# Patient Record
Sex: Female | Born: 1935 | Race: Black or African American | Hispanic: No | Smoking: Former smoker
Health system: Southern US, Community
[De-identification: ages and names within clinical notes are randomized; demographics above are authoritative.]

## PROBLEM LIST (undated history)

## (undated) DIAGNOSIS — I739 Peripheral vascular disease, unspecified: Secondary | ICD-10-CM

## (undated) DIAGNOSIS — E785 Hyperlipidemia, unspecified: Secondary | ICD-10-CM

## (undated) DIAGNOSIS — E039 Hypothyroidism, unspecified: Secondary | ICD-10-CM

## (undated) DIAGNOSIS — R630 Anorexia: Secondary | ICD-10-CM

## (undated) DIAGNOSIS — E079 Disorder of thyroid, unspecified: Secondary | ICD-10-CM

## (undated) DIAGNOSIS — K635 Polyp of colon: Secondary | ICD-10-CM

## (undated) DIAGNOSIS — R011 Cardiac murmur, unspecified: Secondary | ICD-10-CM

## (undated) DIAGNOSIS — J189 Pneumonia, unspecified organism: Secondary | ICD-10-CM

## (undated) DIAGNOSIS — D649 Anemia, unspecified: Secondary | ICD-10-CM

## (undated) DIAGNOSIS — I10 Essential (primary) hypertension: Secondary | ICD-10-CM

## (undated) DIAGNOSIS — M1990 Unspecified osteoarthritis, unspecified site: Secondary | ICD-10-CM

## (undated) HISTORY — DX: Polyp of colon: K63.5

## (undated) HISTORY — DX: Disorder of thyroid, unspecified: E07.9

## (undated) HISTORY — DX: Hyperlipidemia, unspecified: E78.5

## (undated) HISTORY — PX: ABDOMINAL HYSTERECTOMY: SHX81

## (undated) HISTORY — PX: APPENDECTOMY: SHX54

## (undated) HISTORY — PX: BREAST EXCISIONAL BIOPSY: SUR124

## (undated) HISTORY — DX: Anorexia: R63.0

---

## 1998-05-16 ENCOUNTER — Emergency Department (HOSPITAL_COMMUNITY): Admission: EM | Admit: 1998-05-16 | Discharge: 1998-05-16 | Payer: Self-pay | Admitting: Emergency Medicine

## 1998-07-14 ENCOUNTER — Ambulatory Visit (HOSPITAL_COMMUNITY): Admission: RE | Admit: 1998-07-14 | Discharge: 1998-07-14 | Payer: Self-pay | Admitting: Orthopedic Surgery

## 1998-10-29 ENCOUNTER — Encounter: Admission: RE | Admit: 1998-10-29 | Discharge: 1999-01-27 | Payer: Self-pay | Admitting: Anesthesiology

## 2000-01-27 ENCOUNTER — Encounter: Payer: Self-pay | Admitting: Obstetrics and Gynecology

## 2000-01-27 ENCOUNTER — Encounter: Admission: RE | Admit: 2000-01-27 | Discharge: 2000-01-27 | Payer: Self-pay | Admitting: Obstetrics and Gynecology

## 2000-01-28 ENCOUNTER — Encounter: Payer: Self-pay | Admitting: Obstetrics and Gynecology

## 2000-01-28 ENCOUNTER — Encounter: Admission: RE | Admit: 2000-01-28 | Discharge: 2000-01-28 | Payer: Self-pay | Admitting: Obstetrics and Gynecology

## 2000-11-13 ENCOUNTER — Emergency Department (HOSPITAL_COMMUNITY): Admission: EM | Admit: 2000-11-13 | Discharge: 2000-11-13 | Payer: Self-pay | Admitting: Emergency Medicine

## 2001-03-15 ENCOUNTER — Encounter: Admission: RE | Admit: 2001-03-15 | Discharge: 2001-03-15 | Payer: Self-pay | Admitting: Obstetrics and Gynecology

## 2001-03-15 ENCOUNTER — Encounter: Payer: Self-pay | Admitting: Obstetrics and Gynecology

## 2002-02-28 ENCOUNTER — Other Ambulatory Visit: Admission: RE | Admit: 2002-02-28 | Discharge: 2002-02-28 | Payer: Self-pay | Admitting: Gynecology

## 2002-03-22 ENCOUNTER — Encounter: Admission: RE | Admit: 2002-03-22 | Discharge: 2002-03-22 | Payer: Self-pay | Admitting: Gynecology

## 2002-03-22 ENCOUNTER — Encounter: Payer: Self-pay | Admitting: Gynecology

## 2002-05-22 ENCOUNTER — Encounter (INDEPENDENT_AMBULATORY_CARE_PROVIDER_SITE_OTHER): Payer: Self-pay | Admitting: *Deleted

## 2002-05-22 ENCOUNTER — Ambulatory Visit (HOSPITAL_COMMUNITY): Admission: RE | Admit: 2002-05-22 | Discharge: 2002-05-22 | Payer: Self-pay | Admitting: *Deleted

## 2003-05-22 ENCOUNTER — Encounter: Payer: Self-pay | Admitting: Emergency Medicine

## 2003-05-22 ENCOUNTER — Emergency Department (HOSPITAL_COMMUNITY): Admission: EM | Admit: 2003-05-22 | Discharge: 2003-05-22 | Payer: Self-pay | Admitting: Emergency Medicine

## 2003-10-14 ENCOUNTER — Encounter: Admission: RE | Admit: 2003-10-14 | Discharge: 2003-10-14 | Payer: Self-pay | Admitting: Endocrinology

## 2004-06-30 ENCOUNTER — Other Ambulatory Visit: Admission: RE | Admit: 2004-06-30 | Discharge: 2004-06-30 | Payer: Self-pay | Admitting: Gynecology

## 2004-12-16 ENCOUNTER — Ambulatory Visit (HOSPITAL_COMMUNITY): Admission: RE | Admit: 2004-12-16 | Discharge: 2004-12-16 | Payer: Self-pay | Admitting: *Deleted

## 2005-01-15 ENCOUNTER — Emergency Department (HOSPITAL_COMMUNITY): Admission: EM | Admit: 2005-01-15 | Discharge: 2005-01-15 | Payer: Self-pay | Admitting: Emergency Medicine

## 2005-06-02 ENCOUNTER — Encounter: Admission: RE | Admit: 2005-06-02 | Discharge: 2005-06-02 | Payer: Self-pay | Admitting: Endocrinology

## 2005-07-22 ENCOUNTER — Encounter: Admission: RE | Admit: 2005-07-22 | Discharge: 2005-07-22 | Payer: Self-pay

## 2005-08-10 ENCOUNTER — Encounter: Admission: RE | Admit: 2005-08-10 | Discharge: 2005-08-10 | Payer: Self-pay

## 2005-12-20 ENCOUNTER — Encounter: Admission: RE | Admit: 2005-12-20 | Discharge: 2005-12-20 | Payer: Self-pay | Admitting: Orthopedic Surgery

## 2006-07-12 ENCOUNTER — Encounter: Admission: RE | Admit: 2006-07-12 | Discharge: 2006-07-12 | Payer: Self-pay | Admitting: Endocrinology

## 2006-07-15 HISTORY — PX: CARDIOVASCULAR STRESS TEST: SHX262

## 2006-07-26 ENCOUNTER — Other Ambulatory Visit: Admission: RE | Admit: 2006-07-26 | Discharge: 2006-07-26 | Payer: Self-pay | Admitting: Gynecology

## 2007-04-30 ENCOUNTER — Emergency Department (HOSPITAL_COMMUNITY): Admission: EM | Admit: 2007-04-30 | Discharge: 2007-04-30 | Payer: Self-pay | Admitting: Emergency Medicine

## 2007-08-02 ENCOUNTER — Encounter: Admission: RE | Admit: 2007-08-02 | Discharge: 2007-08-02 | Payer: Self-pay | Admitting: Endocrinology

## 2007-11-22 ENCOUNTER — Encounter: Admission: RE | Admit: 2007-11-22 | Discharge: 2007-11-22 | Payer: Self-pay | Admitting: Endocrinology

## 2008-06-27 ENCOUNTER — Emergency Department (HOSPITAL_COMMUNITY): Admission: EM | Admit: 2008-06-27 | Discharge: 2008-06-27 | Payer: Self-pay | Admitting: Emergency Medicine

## 2008-07-23 ENCOUNTER — Observation Stay (HOSPITAL_COMMUNITY): Admission: EM | Admit: 2008-07-23 | Discharge: 2008-07-24 | Payer: Self-pay | Admitting: *Deleted

## 2008-08-21 ENCOUNTER — Encounter: Admission: RE | Admit: 2008-08-21 | Discharge: 2008-08-21 | Payer: Self-pay | Admitting: Endocrinology

## 2009-09-16 ENCOUNTER — Encounter: Admission: RE | Admit: 2009-09-16 | Discharge: 2009-09-16 | Payer: Self-pay | Admitting: Endocrinology

## 2010-09-24 ENCOUNTER — Encounter
Admission: RE | Admit: 2010-09-24 | Discharge: 2010-09-24 | Payer: Self-pay | Source: Home / Self Care | Attending: Endocrinology | Admitting: Endocrinology

## 2011-01-06 ENCOUNTER — Emergency Department (HOSPITAL_COMMUNITY): Payer: Medicare Other

## 2011-01-06 ENCOUNTER — Inpatient Hospital Stay (HOSPITAL_COMMUNITY)
Admission: EM | Admit: 2011-01-06 | Discharge: 2011-01-09 | DRG: 310 | Disposition: A | Discharge status: Home / Self Care | Payer: Medicare Other | Attending: Internal Medicine | Admitting: Internal Medicine

## 2011-01-06 DIAGNOSIS — I4891 Unspecified atrial fibrillation: Principal | ICD-10-CM | POA: Diagnosis present

## 2011-01-06 DIAGNOSIS — E876 Hypokalemia: Secondary | ICD-10-CM | POA: Diagnosis present

## 2011-01-06 DIAGNOSIS — I10 Essential (primary) hypertension: Secondary | ICD-10-CM | POA: Diagnosis present

## 2011-01-06 DIAGNOSIS — E785 Hyperlipidemia, unspecified: Secondary | ICD-10-CM | POA: Diagnosis present

## 2011-01-06 DIAGNOSIS — E039 Hypothyroidism, unspecified: Secondary | ICD-10-CM | POA: Diagnosis present

## 2011-01-06 DIAGNOSIS — E0590 Thyrotoxicosis, unspecified without thyrotoxic crisis or storm: Secondary | ICD-10-CM | POA: Diagnosis not present

## 2011-01-06 DIAGNOSIS — E1169 Type 2 diabetes mellitus with other specified complication: Secondary | ICD-10-CM | POA: Diagnosis present

## 2011-01-06 DIAGNOSIS — Z7901 Long term (current) use of anticoagulants: Secondary | ICD-10-CM

## 2011-01-06 LAB — COMPREHENSIVE METABOLIC PANEL
ALT: 51 U/L — ABNORMAL HIGH (ref 0–35)
AST: 34 U/L (ref 0–37)
CO2: 24 mEq/L (ref 19–32)
Calcium: 9.6 mg/dL (ref 8.4–10.5)
Chloride: 106 mEq/L (ref 96–112)
Creatinine, Ser: 0.83 mg/dL (ref 0.4–1.2)
GFR calc Af Amer: >60 mL/min (ref >60)
GFR calc non Af Amer: >60 mL/min (ref >60)
Glucose, Bld: 79 mg/dL (ref 70–99)
Sodium: 140 mEq/L (ref 135–145)
Total Bilirubin: 0.6 mg/dL (ref 0.3–1.2)

## 2011-01-06 LAB — CBC
Hemoglobin: 12.5 g/dL (ref 12.0–15.0)
MCH: 27.6 pg (ref 26.0–34.0)
MCHC: 33.5 g/dL (ref 30.0–36.0)
RDW: 14.2 % (ref 11.5–15.5)

## 2011-01-06 LAB — POCT I-STAT, CHEM 8
BUN: 23 mg/dL (ref 6–23)
Calcium, Ion: 1.08 mmol/L — ABNORMAL LOW (ref 1.12–1.32)
Creatinine, Ser: 0.9 mg/dL (ref 0.4–1.2)
Glucose, Bld: 77 mg/dL (ref 70–99)
Hemoglobin: 13.3 g/dL (ref 12.0–15.0)
TCO2: 24 mmol/L (ref 0–100)

## 2011-01-06 LAB — PROTIME-INR
INR: 0.99 (ref 0.00–1.49)
Prothrombin Time: 13.3 seconds (ref 11.6–15.2)

## 2011-01-06 LAB — POCT CARDIAC MARKERS: Myoglobin, poc: 56.1 ng/mL (ref 12–200)

## 2011-01-06 LAB — DIFFERENTIAL
Basophils Absolute: 0.1 10*3/uL (ref 0.0–0.1)
Basophils Relative: 1 % (ref 0–1)
Eosinophils Relative: 2 % (ref 0–5)
Monocytes Absolute: 1 10*3/uL (ref 0.1–1.0)
Monocytes Relative: 11 % (ref 3–12)

## 2011-01-07 LAB — BASIC METABOLIC PANEL
CO2: 24 mEq/L (ref 19–32)
Chloride: 104 mEq/L (ref 96–112)
GFR calc non Af Amer: >60 mL/min (ref >60)
Glucose, Bld: 87 mg/dL (ref 70–99)
Potassium: 3 mEq/L — ABNORMAL LOW (ref 3.5–5.1)
Sodium: 141 mEq/L (ref 135–145)

## 2011-01-07 LAB — GLUCOSE, CAPILLARY: Glucose-Capillary: 76 mg/dL (ref 70–99)

## 2011-01-07 LAB — CBC
HCT: 33 % — ABNORMAL LOW (ref 36.0–46.0)
Hemoglobin: 10.8 g/dL — ABNORMAL LOW (ref 12.0–15.0)
MCV: 82.7 fL (ref 78.0–100.0)
Platelets: 277 10*3/uL (ref 150–400)
RBC: 3.99 MIL/uL (ref 3.87–5.11)
WBC: 7.6 10*3/uL (ref 4.0–10.5)

## 2011-01-08 HISTORY — PX: TRANSTHORACIC ECHOCARDIOGRAM: SHX275

## 2011-01-08 LAB — GLUCOSE, CAPILLARY
Glucose-Capillary: 130 mg/dL — ABNORMAL HIGH (ref 70–99)
Glucose-Capillary: 63 mg/dL — ABNORMAL LOW (ref 70–99)

## 2011-01-08 LAB — CBC
HCT: 32.6 % — ABNORMAL LOW (ref 36.0–46.0)
MCHC: 32.5 g/dL (ref 30.0–36.0)
MCV: 82.5 fL (ref 78.0–100.0)
RDW: 14.3 % (ref 11.5–15.5)

## 2011-01-08 LAB — BASIC METABOLIC PANEL
CO2: 25 mEq/L (ref 19–32)
Calcium: 9.1 mg/dL (ref 8.4–10.5)
Creatinine, Ser: 0.77 mg/dL (ref 0.4–1.2)
GFR calc Af Amer: >60 mL/min (ref >60)
Glucose, Bld: 73 mg/dL (ref 70–99)

## 2011-01-09 LAB — CBC
MCH: 26.8 pg (ref 26.0–34.0)
MCHC: 32.6 g/dL (ref 30.0–36.0)
Platelets: 260 10*3/uL (ref 150–400)

## 2011-01-09 LAB — BASIC METABOLIC PANEL
Calcium: 9.2 mg/dL (ref 8.4–10.5)
GFR calc non Af Amer: >60 mL/min (ref >60)
Glucose, Bld: 67 mg/dL — ABNORMAL LOW (ref 70–99)
Sodium: 141 mEq/L (ref 135–145)

## 2011-01-09 LAB — PROTIME-INR
INR: 1 (ref 0.00–1.49)
Prothrombin Time: 13.4 seconds (ref 11.6–15.2)

## 2011-01-09 LAB — GLUCOSE, CAPILLARY: Glucose-Capillary: 100 mg/dL — ABNORMAL HIGH (ref 70–99)

## 2011-01-09 LAB — HEPARIN LEVEL (UNFRACTIONATED): Heparin Unfractionated: 1.05 IU/mL — ABNORMAL HIGH (ref 0.30–0.70)

## 2011-01-09 LAB — LIPID PANEL: Triglycerides: 101 mg/dL (ref <150)

## 2011-01-13 NOTE — Discharge Summary (Signed)
  NAMEEMELINE, SIMPSON NO.:  [id number]  MEDICAL RECORD NO.:  [id number]           PATIENT TYPE:  I  LOCATION:  2005                         FACILITY:  [facility]  PHYSICIAN:  [place] Molly Maselli, MD         DATE OF BIRTH:  05-01-1935  DATE OF ADMISSION:  01/06/2011 DATE OF DISCHARGE:  01/09/2011                              DISCHARGE SUMMARY   DISCHARGE DIAGNOSES: 1. Paroxysmal atrial fibrillation, the patient is in sinus rhythm at     discharge. 2. Treated hypertension. 3. Iatrogenic hyperthyroid, Synthroid held. 4. Type 2 non-insulin-dependent diabetes.  HOSPITAL COURSE:  The patient is a 76 year old female followed by Dr. Juleen China who presented with rapid atrial fibrillation to his office.  She was sent to [facility] ER and was seen by Dr. Rennis Golden.  Please see history and physical for complete details.  She was put on IV diltiazem and IV Cardizem and labs were obtained.  Her labs show a TSH of 0.01 with a T4 of 13.6 and a free T4 of 2.04.  Her Synthroid was held.  She was also noted to be hypokalemic with a potassium of 3.2 and this was repleted. Troponins were negative.  She continued to have faster rate and we added beta-blocker.  Coumadin was started.  She was changed over to Lovenox. By the 31st, she is converted to sinus rhythm and Dr. Tresa Endo feels she can go home with 3 doses of Lovenox and Coumadin.  She will have an INR in our office next week.  She is going to [place] next Friday and we will try and get her seen by cardiologist before she goes to [place]. She is improved symptomatically.  She will need followup of her TSH and this could be done by Dr. Juleen China.  LABORATORY DATA:  TSH is as noted above.  Troponins are negative x1. Liver functions show an AST of 34 and ALT 51.  Cholesterol is 88, HDL 36, and LDL 32.  Sodium 141, potassium 3.6, BUN 12, and creatinine 0.7. INR is 1.05 at discharge.  White count 7.3, hemoglobin 11, hematocrit 33.7, and platelets 260.  Chest  x-ray shows left basilar scarring, mild cardiomegaly, and low lung volumes.  Telemetry at discharge shows sinus rhythm.  DISPOSITION:  The patient is discharged in stable condition.  She will have a INR next Tuesday in the office.  She will see Dr. Rennis Golden later this week before she goes to [place].  She will need to follow up with Dr. Juleen China regarding her Synthroid dose and followup of her TSH.     Abelino Derrick, P.A.   ______________________________ [place] Izaias Krupka, MD    LKK/MEDQ  D:  01/09/2011  T:  01/10/2011  Job:  [id number]  cc:   Brooke Bonito, M.D.  Electronically Signed by Corine Shelter P.A. on 01/11/2011 11:39:42 AM Electronically Signed by Kirtland Bouchard. Na Waldrip M.D. on 01/13/2011 08:56:21 AM

## 2011-02-23 NOTE — H&P (Signed)
[name], [name] NO.:  [id number]   MEDICAL RECORD NO.:  [id number]          PATIENT TYPE:  INP   LOCATION:  1440                         FACILITY:  [facility]   PHYSICIAN:  [name], M.D. DATE OF BIRTH:  1935/08/10   DATE OF ADMISSION:  07/22/2008  DATE OF DISCHARGE:                              HISTORY & PHYSICAL   PRIMARY CARE PHYSICIAN:  Dr. Darci Needle.   CHIEF COMPLAINT:  Chest pain.   HISTORY OF PRESENT ILLNESS:  This is a 76 year old African American lady  with a history of hiatal hernia, hypothyroidism, hypertension, and  hyperlipidemia who was in her baseline state of health until she went  out to a [place] restaurant about 4:30 yesterday afternoon.  About a  half an hour after leaving the restaurant, she started having chest pain  which she felt was related to her stomach, and she started using  maneuvers that she usually takes when she has acid-reflux problems.  She  tried using [employer].  She tried using Pepcid but all to no avail.  The  pain seemed to get worse.  It  radiated to the left side of her chest  and down her left arm.  It was associated with nausea but no vomiting.  There was no diaphoresis or shortness of breath.  She denies any history  of dyspnea on exertion.  She denies any history of prior cardiac type  chest pains or lower extremity edema.   She reports that she had cardiac stress test done a year ago with the  doctor that works with Dr. Juleen China, presumably Dr. Aleen Campi, and stress  test was negative.  Because the pain was unrelieved and getting worse,  she came to the emergency room, and she was evaluated with chest pain,  and the hospitalist service called to assist with management.   PAST MEDICAL HISTORY:  1. Hypertension.  2. Hyperlipidemia.  3. Hypothyroidism.  4. GERD as noted above.   MEDICATIONS:  1. Crestor 20 mg daily.  2. Potassium chloride 1 tablet daily.  3. Hydrochlorothiazide 50 mg daily.  4.  Pepcid p.r.n.  5. Synthroid 50 mcg daily.  6. Aspirin 81 mg daily.  7. Ibuprofen and/or Celebrex p.r.n., the last use about 3 weeks ago.   ALLERGIES:  No known drug allergies.   SOCIAL HISTORY:  Denies tobacco, alcohol or illicit drug use.  She  actually discontinued smoking half a pack per day about 8 months ago.  She is a retired Conservation officer, nature.   FAMILY HISTORY:  Is significant for hypertension, diabetes and heart  disease.   REVIEW OF SYSTEMS:  Other than noted above, significant only for  episodic arthritis in the joints.   PHYSICAL EXAMINATION:  Pleasant elderly Caucasian lady lying in  stretcher, no acute distress at this time, since the nitroglycerin did  relieve the pain.  VITALS:  Temperature is 97.6, pulse 82, respirations 20, blood pressure  182/83, saturating 100% on 2 liters.  Pupils are round and equal.  Mucous membranes pink and anicteric.  She  is mildly dehydrated.  No oral lesions.  No  cervical lymphadenopathy or  thyromegaly.  Her chest is clear to auscultation bilaterally.  CARDIOVASCULAR SYSTEM:  Regular rhythm without murmur.  ABDOMEN:  Is obese, soft and nontender.  EXTREMITIES:  Without edema.  She has 2+ pulses bilaterally.  She has  arthritic deformities of the ankles and knees and wrists.  CENTRAL NERVOUS SYSTEM:  Cranial nerves II-XII are grossly intact, and  she has no focal neurologic deficits.   LABORATORY DATA:  Her CBC is remarkable only for mild anemia with a  hemoglobin of 11.8.  Her serum chemistry is remarkable for a potassium  of 3.2 with normal BUN and creatinine.  Her cardiac enzymes are  completely normal with undetectable troponin, myoglobin of 44 and CK-MB  of 1.6.  Her coags are normal.  Repeat cardiac enzymes remain normal.   ASSESSMENT:  1. Gastroesophageal reflux disease like chest pain in an elderly lady      with a history of hiatal hernia and no history of coronary artery      disease.  2. Hypertension, uncontrolled.  3.  Hypokalemia.   PLAN:  Despite high likelihood of this being GERD, because of the  patient's age and risk factors it would be wise to admit her for a rule  out and with the benefit of cardiology evaluation.  Will replete her  potassium and monitor blood pressure for adequate control.  Other plans  as per orders.      [name], M.D.  Electronically Signed     LC/MEDQ  D:  07/23/2008  T:  07/23/2008  Job:  [id number]

## 2011-02-23 NOTE — Discharge Summary (Signed)
[name], NEWLUN NO.:  [id number]   MEDICAL RECORD NO.:  [id number]          PATIENT TYPE:  INP   LOCATION:  1440                         FACILITY:  [facility]   PHYSICIAN:  Lonia Blood, M.D.       DATE OF BIRTH:  [birth year]   DATE OF ADMISSION:  07/22/2008  DATE OF DISCHARGE:  07/24/2008                               DISCHARGE SUMMARY   PRIMARY CARE PHYSICIAN:  Brooke Bonito, M.D.   DISCHARGE DIAGNOSIS:  1. Chest pain - felt to be noncardiac by the consulting cardiologist,      Antionette Char, MD  2. Hypokalemia resolved.  3. Hypothyroidism with a measured TSH of 25 - left up to PCP to decide      what to do with the dose of Synthroid.  4. Hypertension.  5. Gastroesophageal reflux disease.   DISCHARGE MEDICATIONS:  1. Crestor 20 mg daily.  2. Potassium chloride 20 mEq twice a day.  3. Hydrochlorothiazide 50 mg daily.  4. Pepcid 20 mg twice a day.  5. Synthroid according to the patient is 150 mcg daily.  6. Aspirin 81 mg daily.   CONDITION ON DISCHARGE:  [name] is discharged in good condition.  She will follow up with her primary care physician tomorrow to discuss  an increase in her Synthroid dose if indicated by her history.  The  patient will follow up with Dr. Charolette Child from cardiology for an  outpatient stress test.   PROCEDURES DURING THIS ADMISSION:  No procedures done.   CONSULTATION:  The patient was seen in consultation by Dr. Charolette Child  from cardiology.   HISTORY AND PHYSICAL:  Refer to dictated H and P done by Dr. Vania Rea on July 22, 2008.   HOSPITAL COURSE:  1. [name], a 73-year lady was admitted from the emergency room      with complaints of chest pain.  She was placed on telemetry and had      three sets of cardiac enzymes which were all within normal limits.      The patient was seen by Dr. Charolette Child from cardiology who felt      that the patient's chest pain was noncardiac.  [name] chest  pain resolved with proton pump inhibitor.  She will follow up with      her primary cardiologist for further testing.  2. Hypokalemia due to hydrochlorothiazide.  This has been repleted      during this hospital stay with a discharge potassium being 3.9.  We      have increased the patient's potassium dose to 20 mEq twice a day      for home.  3. Hypothyroidism with a measured TSH level of 25.  Ms. Letendre dose      of Synthroid is unclear.  I could not reach her primary care      physician as the office is closed on Wednesday afternoon.  I have      told the patient to follow up tomorrow with the primary care  physician to discuss increasing the Synthroid dose.  I am afraid to      titrate higher thyroid dose in a patient with chest pain that could      be cardiac.  I would leave it up to primary care physician and      primary cardiologist to decide this.  4. Hyperlipidemia.  This is well-controlled with Crestor and      medication will be continued.      Lonia Blood, M.D.  Electronically Signed     SL/MEDQ  D:  07/24/2008  T:  07/24/2008  Job:  [id number]   cc:   Antionette Char, MD  Fax: [phone]   Brooke Bonito, M.D.  Fax: [phone]

## 2011-02-26 NOTE — Op Note (Signed)
   NAMESENORA, LACSON NO.:  [id number]   MEDICAL RECORD NO.:  [id number]                   PATIENT TYPE:  AMB   LOCATION:  ENDO                                 FACILITY:  [facility]   PHYSICIAN:  Georgiana Spinner, M.D.                 DATE OF BIRTH:  28-Apr-1935   DATE OF PROCEDURE:  DATE OF DISCHARGE:                                 OPERATIVE REPORT   PROCEDURE:  Colonoscopy.   INDICATIONS:  Colon polyps.   ANESTHESIA:  Versed 3 mg.   DESCRIPTION OF PROCEDURE:  With the patient mildly sedated in the left  lateral decubitus position, the Olympus videoscopic colonoscope is inserted  in the rectum and passed under direct vision to the cecum identified by the  ileocecal valve and appendiceal orifice.  Prep was good.  From this point  the colonoscope was slowly withdrawn, taking circumferential views of the  entire colonic mucosa, stopping only at 30 cm from the anal verge, at which  point a small polyp was seen and photographed and removed using biopsy  forceps technique.  Tissue was retrieved.  The endoscope was withdrawn all  the way down to the rectum, which appeared normal on direct and retroflex  view.  The endoscope was straightened and withdrawn.  The patient's vital  signs and pulse oximetry remained stable.  The patient tolerated the  procedure well and without apparent complications.   FINDINGS:  Small polyp at 30 cm from the anal verge, removed.   PLAN:  Await biopsy report.  The patient will call me for results and follow  up as an outpatient.                                               Georgiana Spinner, M.D.    GMO/MEDQ  D:  05/22/2002  T:  05/24/2002  Job:  [id number]

## 2011-02-26 NOTE — Op Note (Signed)
[name], [name]  [id number]   MEDICAL RECORD NO.:  [id number]          PATIENT TYPE:  AMB   LOCATION:  ENDO                         FACILITY:  [facility]   PHYSICIAN:  Georgiana Spinner, M.D.    DATE OF BIRTH:  [birth year]   DATE OF PROCEDURE:  12/16/2004  DATE OF DISCHARGE:                                 OPERATIVE REPORT   PROCEDURE:  Colonoscopy.   INDICATIONS:  Colon polyps.   ANESTHESIA:  Demerol 70 mg, Versed 7 mg.   PROCEDURE:  With the patient mildly sedated in the left lateral decubitus  position, the Olympus videoscopic colonoscope was inserted in the rectum and  passed under direct vision to the cecum identified by the ileocecal valve  and appendiceal orifice both which were photographed.  From this point, the  colonoscope was slowly withdrawn taking circumferential views of colonic  mucosa stopping in the rectum which appeared normal on direct and showed  hemorrhoids on retroflexed view.  The endoscope was straightened and  withdrawn. The patient's vital signs and pulse oximeter remained stable. The  patient tolerated procedure well without apparent complications.   FINDINGS:  Internal hemorrhoids otherwise unremarkable colonoscopic  examination to the cecum.   PLAN:  Have patient follow-up with me in 5 years or as needed.      GMO/MEDQ  D:  12/16/2004  T:  12/16/2004  Job:  [id number]

## 2011-02-26 NOTE — Op Note (Signed)
   NAMEMACKENSI, MAHADEO NO.:  [id number]   MEDICAL RECORD NO.:  [id number]                   PATIENT TYPE:  AMB   LOCATION:  ENDO                                 FACILITY:  [facility]   PHYSICIAN:  Georgiana Spinner, M.D.                 DATE OF BIRTH:  [birth year]   DATE OF PROCEDURE:  DATE OF DISCHARGE:                                 OPERATIVE REPORT   PROCEDURE:  Upper endoscopy.   INDICATIONS:  GERD.   ANESTHESIA:  Demerol 80 mg, Versed 7 mg.   DESCRIPTION OF PROCEDURE:  With the patient mildly sedated in the left  lateral decubitus position, the Olympus videoscopic endoscope was inserted  in the mouth, passed under direct vision through the esophagus, which  appeared normal, into a hiatal hernia sac.  Fundus, body, antrum, duodenal  bulb, and second portion of duodenum all appeared normal.  From this point  the endoscope was slowly withdrawn, taking circumferential views of the  entire duodenal mucosa until the endoscope had been pulled back into the  stomach, placed in retroflexion to view the stomach from below.  The  endoscope was straightened and withdrawn, taking circumferential views of  the remaining gastric and esophageal mucosa.  The patient's vital signs and  pulse oximetry remained stable.  The patient tolerated the procedure well  without apparent complications.   FINDINGS:  Hiatal hernia, otherwise unremarkable exam.   PLAN:  Proceed to colonoscopy.                                               Georgiana Spinner, M.D.    GMO/MEDQ  D:  05/22/2002  T:  05/23/2002  Job:  [id number]

## 2011-03-14 ENCOUNTER — Emergency Department (HOSPITAL_COMMUNITY)
Admission: EM | Admit: 2011-03-14 | Discharge: 2011-03-15 | Disposition: A | Discharge status: Home / Self Care | Payer: Medicare Other | Attending: Emergency Medicine | Admitting: Emergency Medicine

## 2011-03-14 DIAGNOSIS — N390 Urinary tract infection, site not specified: Secondary | ICD-10-CM | POA: Insufficient documentation

## 2011-03-14 DIAGNOSIS — E039 Hypothyroidism, unspecified: Secondary | ICD-10-CM | POA: Insufficient documentation

## 2011-03-14 DIAGNOSIS — E119 Type 2 diabetes mellitus without complications: Secondary | ICD-10-CM | POA: Insufficient documentation

## 2011-03-14 DIAGNOSIS — E876 Hypokalemia: Secondary | ICD-10-CM | POA: Insufficient documentation

## 2011-03-14 DIAGNOSIS — E785 Hyperlipidemia, unspecified: Secondary | ICD-10-CM | POA: Insufficient documentation

## 2011-03-14 DIAGNOSIS — K219 Gastro-esophageal reflux disease without esophagitis: Secondary | ICD-10-CM | POA: Insufficient documentation

## 2011-03-14 DIAGNOSIS — I10 Essential (primary) hypertension: Secondary | ICD-10-CM | POA: Insufficient documentation

## 2011-03-14 DIAGNOSIS — R1013 Epigastric pain: Secondary | ICD-10-CM | POA: Insufficient documentation

## 2011-03-14 HISTORY — DX: Essential (primary) hypertension: I10

## 2011-03-15 ENCOUNTER — Encounter (HOSPITAL_COMMUNITY): Payer: Self-pay | Admitting: Radiology

## 2011-03-15 ENCOUNTER — Emergency Department (HOSPITAL_COMMUNITY): Payer: Medicare Other

## 2011-03-15 LAB — URINALYSIS, ROUTINE W REFLEX MICROSCOPIC
Bilirubin Urine: NEGATIVE
Ketones, ur: NEGATIVE mg/dL
Protein, ur: NEGATIVE mg/dL
Urobilinogen, UA: 1 mg/dL (ref 0.0–1.0)

## 2011-03-15 LAB — COMPREHENSIVE METABOLIC PANEL
AST: 13 U/L (ref 0–37)
Alkaline Phosphatase: 68 U/L (ref 39–117)
BUN: 13 mg/dL (ref 6–23)
CO2: 31 mEq/L (ref 19–32)
Chloride: 94 mEq/L — ABNORMAL LOW (ref 96–112)
Creatinine, Ser: 0.6 mg/dL (ref 0.4–1.2)
GFR calc non Af Amer: >60 mL/min (ref >60)
Potassium: 2.5 mEq/L — CL (ref 3.5–5.1)
Total Bilirubin: 0.3 mg/dL (ref 0.3–1.2)

## 2011-03-15 LAB — POCT I-STAT, CHEM 8
BUN: 9 mg/dL (ref 6–23)
Calcium, Ion: 1.05 mmol/L — ABNORMAL LOW (ref 1.12–1.32)
Chloride: 100 mEq/L (ref 96–112)
Glucose, Bld: 98 mg/dL (ref 70–99)
TCO2: 28 mmol/L (ref 0–100)

## 2011-03-15 LAB — CBC
Hemoglobin: 11.5 g/dL — ABNORMAL LOW (ref 12.0–15.0)
MCH: 26.7 pg (ref 26.0–34.0)
MCV: 81.7 fL (ref 78.0–100.0)
RBC: 4.31 MIL/uL (ref 3.87–5.11)
WBC: 8.2 10*3/uL (ref 4.0–10.5)

## 2011-03-15 LAB — DIFFERENTIAL
Lymphocytes Relative: 35 % (ref 12–46)
Lymphs Abs: 2.9 10*3/uL (ref 0.7–4.0)
Monocytes Relative: 8 % (ref 3–12)
Neutro Abs: 4.5 10*3/uL (ref 1.7–7.7)
Neutrophils Relative %: 55 % (ref 43–77)

## 2011-03-15 LAB — PROTIME-INR: INR: 2.94 — ABNORMAL HIGH (ref 0.00–1.49)

## 2011-03-15 MED ORDER — IOHEXOL 300 MG/ML  SOLN: 100.0000 mL | Freq: Once | INTRAMUSCULAR | Status: DC | PRN

## 2011-03-15 NOTE — H&P (Signed)
NAMEJEFFRIE, STANDER NO.:  [id number]  MEDICAL RECORD NO.:  [id number]  LOCATION:  [facility]                         FACILITY:  [facility]  PHYSICIAN:  Talmage Nap, MD  DATE OF BIRTH:  27-Mar-1935  DATE OF ADMISSION:  03/14/2011 DATE OF DISCHARGE:                             HISTORY & PHYSICAL   PRIMARY ENDOCRINOLOGIST:  Brooke Bonito, MD  PRIMARY CARE PHYSICIAN:  Unknown.  History obtainable from the patient and the patient's spouse.  CHIEF COMPLAINT:  Abdominal pain of about 3 days' duration.  HISTORY OF PRESENT ILLNESS:  The patient is a 76 year old African American female with history of hypertension, diabetes mellitus, and GERD, presenting to the emergency room with 3 days' history of abdominal pain which had been on and off for about 3 days, but this got progressively worse 24 hours prior to presenting to the emergency room. The patient claimed that 3 days prior to presenting to the emergency room, she suddenly developed epigastric pain which she described as achy, nonradiating, initially about 4/10 intensity and this was said to have progressed to about 8/10 in intensity.  She ever denied any associated chest pain.  She denied any history of shortness of breath. She denied any nausea or vomiting.  No fever.  No chills.  No rigor. There is no radiation of the pain.  She denied any history of diarrhea or hematochezia but pain was said to have been on and off, but got progressively worse, hence she presented to the emergency room to be evaluated.  PAST MEDICAL HISTORY:  Positive for hypertension, diabetes mellitus, GERD, hyperlipidemia, and hypothyroidism.  PAST SURGICAL HISTORY:  Hysterectomy, colonoscopy, status post polypectomy.  MEDICATIONS:  Her preadmission meds include; 1. Calcium carbonate Tums 500 mg chewable 1 p.o. t.i.d. p.r.n. 2. Potassium chloride 20 mEq 1 p.o. daily. 3. Multivitamin 1 p.o. every other day. 4. Metoprolol succinate 50 mg  (Toprol-XL) 1 p.o. daily. 5. Hydrochlorothiazide 50 mg 1 p.o. daily. 6. Glyburide/metformin 5/500 one p.o. b.i.d. 7. Flonase (fluticasone propionate) nasal spray each nostril 1 spray     daily p.r.n. 8. Diltiazem 120 mg CD/ER (thiazide/Cardizem) 1 p.o. daily. 9. Detrol LA (tolterodine) 2 mg 1 p.o. daily. 10.Crestor (rosuvastatin) 10 mg p.o. daily. 11.Colace 100 mg 1 capsule daily p.r.n. 12.Calcium carbonate/vitamin D over-the-counter 1 p.o. daily. 13.Enteric-coated aspirin 81 mg 1 p.o. daily. 14.Acetaminophen 325 mg 2 p.o. q.4 p.r.n. 15.Vitamin D3 2000 units 1 p.o. daily. 16.Triamterene 50 mg 1 capsule p.o. daily. 17.Senokot (senna) 1 p.o. daily p.r.n. 18.Prilosec (omeprazole) 20 mg 1 p.o. daily p.r.n. 19.Synthroid (levothyroxine) 125 mcg 1 p.o. daily. 20.Warfarin 5 mg 1-1/2 tablet to 2 tablets p.o. daily.  ALLERGIES:  She has no known allergies.  SOCIAL HISTORY:  Negative for alcohol, tobacco use, and the patient works in various homes as occasional helper.  FAMILY HISTORY:  Positive for hypertension and diabetes mellitus.  REVIEW OF SYSTEMS:  The patient presently denies any headaches.  No blurred vision.  No nausea or vomiting.  No fever.  No chills.  No rigor.  No chest pain or shortness of breath.  No cough.  Abdominal pain has resolved.  Denies any diarrhea or hematochezia.  No dysuria or hematuria.  No swelling of the lower extremities.  No intolerance to heat or cold and no known psychiatric disorder.  PHYSICAL EXAMINATION:  GENERAL:  Elderly lady well-hydrated, not in any respiratory distress. PRESENT VITAL SIGNS:  Blood pressure is 151/79, pulse 82, respiratory rate 20, temperature is 98.5. HEENT:  Pupils are reactive to light and extraocular muscles are intact. NECK:  No jugular venous distention.  No carotid bruit.  No lymphadenopathy. CHEST:  Clear to auscultation. CARDIAC:  Heart sounds are 1 and 2. ABDOMEN:  Soft, nontender.  Liver, spleen tip not palpable.   Bowel sounds are positive. EXTREMITIES:  No pedal edema. NEUROLOGIC:  Nonfocal. MUSCULOSKELETAL:  Unremarkable. SKIN:  Normal turgor.  LABORATORY DATA:  Coagulation profile showed PT 30.7, INR 2.94. Chemistry showed sodium of 137, potassium of 2.5, chloride of 94 with a bicarb of 31, glucose is 205, BUN is 13, creatinine 0.60.  LFT; AST 13, ALT 14, alkaline phosphatase 68 all normal.  Lipase 22 normal. Hematological indices showed WBC of 8.2, hemoglobin of 11.5, hematocrit of 35.2, MCV of 81.7 with a platelet count of 241, neutrophils is 65%, differentials are all normal.  Urine microscopy showed urine wbc 11-20 with rare bacteria and urine microscopy showed negative nitrite with moderate leukocyte esterase.  Imaging study done include CT of abdomen and pelvis which showed mild distention of the gallbladder.  There is increased caliber of proximal pancreatic duct which measures up to about 6.8 mm, normal if less than or equal to 30 mm, so that is within normal. No obstruction, stone or mass identified.  EKG showed normal sinus rhythm with a rate of 67.  There is LVH with nonspecific T-wave abnormalities.  IMPRESSION:  Abdominal pain.  Differential will include acute gastritis, gastroesophageal reflux disease.  Other medical problems will include; 1. Hypokalemia. 2. Hypertension. 3. Diabetes mellitus. 4. Gastroesophageal reflux disease. 5. Hyperlipidemia. 6. Hypothyroidism.  PLAN:  To give the patient KCl 40 mEq p.o. stat.  She also will be on Rocephin 1 g IV stat, Protonix 40 mg p.o. daily, Cipro 500 mg p.o. b.i.d. for next 5 days.  She also will be on hyoscyamine 0.25 mg p.o. t.i.d. p.r.n. and subsequently, the patient will be discharged home.  I discussed the case in full detail with the ED physician Dr. Weldon Inches and both of [place] agreed that the patient does not meet the criteria for admission.  I also discussed with the patient and the patient's spouse and then they were  agreeable to not been admitted.  The patient was however advised that if pain persists despite the above medication, she should return to the emergency room to be reevaluated.     Talmage Nap, MD     CN/MEDQ  D:  03/15/2011  T:  03/15/2011  Job:  [id number]  Electronically Signed by Talmage Nap  on 03/15/2011 04:01:51 PM

## 2011-03-16 ENCOUNTER — Inpatient Hospital Stay (HOSPITAL_COMMUNITY)
Admission: EM | Admit: 2011-03-16 | Discharge: 2011-03-24 | DRG: 414 | Disposition: A | Discharge status: Home Health | Payer: Medicare Other | Attending: Family Medicine | Admitting: Family Medicine

## 2011-03-16 DIAGNOSIS — I10 Essential (primary) hypertension: Secondary | ICD-10-CM | POA: Diagnosis present

## 2011-03-16 DIAGNOSIS — I4891 Unspecified atrial fibrillation: Secondary | ICD-10-CM | POA: Diagnosis present

## 2011-03-16 DIAGNOSIS — D62 Acute posthemorrhagic anemia: Secondary | ICD-10-CM | POA: Diagnosis not present

## 2011-03-16 DIAGNOSIS — E039 Hypothyroidism, unspecified: Secondary | ICD-10-CM | POA: Diagnosis present

## 2011-03-16 DIAGNOSIS — A5485 Gonococcal peritonitis: Secondary | ICD-10-CM | POA: Diagnosis present

## 2011-03-16 DIAGNOSIS — J449 Chronic obstructive pulmonary disease, unspecified: Secondary | ICD-10-CM | POA: Diagnosis present

## 2011-03-16 DIAGNOSIS — E785 Hyperlipidemia, unspecified: Secondary | ICD-10-CM | POA: Diagnosis present

## 2011-03-16 DIAGNOSIS — Z5331 Laparoscopic surgical procedure converted to open procedure: Secondary | ICD-10-CM

## 2011-03-16 DIAGNOSIS — E119 Type 2 diabetes mellitus without complications: Secondary | ICD-10-CM | POA: Diagnosis present

## 2011-03-16 DIAGNOSIS — K8000 Calculus of gallbladder with acute cholecystitis without obstruction: Principal | ICD-10-CM | POA: Diagnosis present

## 2011-03-16 DIAGNOSIS — E669 Obesity, unspecified: Secondary | ICD-10-CM | POA: Diagnosis present

## 2011-03-16 DIAGNOSIS — Z7901 Long term (current) use of anticoagulants: Secondary | ICD-10-CM

## 2011-03-16 DIAGNOSIS — I4892 Unspecified atrial flutter: Secondary | ICD-10-CM | POA: Diagnosis not present

## 2011-03-16 DIAGNOSIS — J4489 Other specified chronic obstructive pulmonary disease: Secondary | ICD-10-CM | POA: Diagnosis present

## 2011-03-16 DIAGNOSIS — E43 Unspecified severe protein-calorie malnutrition: Secondary | ICD-10-CM | POA: Diagnosis present

## 2011-03-17 ENCOUNTER — Inpatient Hospital Stay (HOSPITAL_COMMUNITY): Payer: Medicare Other

## 2011-03-17 ENCOUNTER — Other Ambulatory Visit (HOSPITAL_COMMUNITY): Payer: Medicare Other

## 2011-03-17 LAB — DIFFERENTIAL
Basophils Relative: 0 % (ref 0–1)
Eosinophils Absolute: 0.2 10*3/uL (ref 0.0–0.7)
Eosinophils Relative: 2 % (ref 0–5)
Lymphs Abs: 2.1 10*3/uL (ref 0.7–4.0)
Monocytes Absolute: 0.6 10*3/uL (ref 0.1–1.0)
Monocytes Relative: 6 % (ref 3–12)
Neutrophils Relative %: 72 % (ref 43–77)

## 2011-03-17 LAB — URINALYSIS, MICROSCOPIC ONLY
Glucose, UA: NEGATIVE mg/dL
Protein, ur: NEGATIVE mg/dL
Specific Gravity, Urine: 1.012 (ref 1.005–1.030)
pH: 6 (ref 5.0–8.0)

## 2011-03-17 LAB — CBC
MCH: 27.1 pg (ref 26.0–34.0)
MCH: 27.3 pg (ref 26.0–34.0)
MCHC: 33.4 g/dL (ref 30.0–36.0)
MCV: 81.7 fL (ref 78.0–100.0)
Platelets: 228 10*3/uL (ref 150–400)
Platelets: 230 10*3/uL (ref 150–400)
RBC: 4.31 MIL/uL (ref 3.87–5.11)
RBC: 4.47 MIL/uL (ref 3.87–5.11)
RDW: 13.8 % (ref 11.5–15.5)
WBC: 10.3 10*3/uL (ref 4.0–10.5)

## 2011-03-17 LAB — COMPREHENSIVE METABOLIC PANEL
ALT: 17 U/L (ref 0–35)
AST: 146 U/L — ABNORMAL HIGH (ref 0–37)
Albumin: 3.8 g/dL (ref 3.5–5.2)
Alkaline Phosphatase: 79 U/L (ref 39–117)
Chloride: 94 mEq/L — ABNORMAL LOW (ref 96–112)
Chloride: 97 mEq/L (ref 96–112)
Creatinine, Ser: 0.54 mg/dL (ref 0.4–1.2)
GFR calc Af Amer: >60 mL/min (ref >60)
GFR calc Af Amer: >60 mL/min (ref >60)
GFR calc non Af Amer: >60 mL/min (ref >60)
Glucose, Bld: 134 mg/dL — ABNORMAL HIGH (ref 70–99)
Total Bilirubin: 0.4 mg/dL (ref 0.3–1.2)

## 2011-03-17 LAB — GLUCOSE, CAPILLARY
Glucose-Capillary: 128 mg/dL — ABNORMAL HIGH (ref 70–99)
Glucose-Capillary: 156 mg/dL — ABNORMAL HIGH (ref 70–99)
Glucose-Capillary: 156 mg/dL — ABNORMAL HIGH (ref 70–99)

## 2011-03-17 LAB — TSH: TSH: 0.059 u[IU]/mL — ABNORMAL LOW (ref 0.350–4.500)

## 2011-03-17 LAB — LIPASE, BLOOD: Lipase: 18 U/L (ref 11–59)

## 2011-03-17 LAB — TROPONIN I: Troponin I: <0.3 ng/mL (ref <0.30)

## 2011-03-17 LAB — MAGNESIUM: Magnesium: 1.8 mg/dL (ref 1.5–2.5)

## 2011-03-17 MED ORDER — TECHNETIUM TC 99M MEBROFENIN IV KIT
5.0000 | PACK | Freq: Once | INTRAVENOUS | Status: AC | PRN
  Administered 2011-03-17: 5.5 via INTRAVENOUS

## 2011-03-18 ENCOUNTER — Inpatient Hospital Stay (HOSPITAL_COMMUNITY): Payer: Medicare Other

## 2011-03-18 LAB — COMPREHENSIVE METABOLIC PANEL
ALT: 67 U/L — ABNORMAL HIGH (ref 0–35)
Albumin: 3.1 g/dL — ABNORMAL LOW (ref 3.5–5.2)
Alkaline Phosphatase: 85 U/L (ref 39–117)
BUN: 9 mg/dL (ref 6–23)
Chloride: 92 mEq/L — ABNORMAL LOW (ref 96–112)
Glucose, Bld: 151 mg/dL — ABNORMAL HIGH (ref 70–99)
Potassium: 2.8 mEq/L — ABNORMAL LOW (ref 3.5–5.1)
Sodium: 132 mEq/L — ABNORMAL LOW (ref 135–145)
Total Bilirubin: 1 mg/dL (ref 0.3–1.2)

## 2011-03-18 LAB — GLUCOSE, CAPILLARY: Glucose-Capillary: 148 mg/dL — ABNORMAL HIGH (ref 70–99)

## 2011-03-18 LAB — MAGNESIUM: Magnesium: 2 mg/dL (ref 1.5–2.5)

## 2011-03-18 LAB — POTASSIUM: Potassium: 3.4 mEq/L — ABNORMAL LOW (ref 3.5–5.1)

## 2011-03-18 LAB — CBC
MCH: 26.3 pg (ref 26.0–34.0)
MCV: 80.8 fL (ref 78.0–100.0)
Platelets: 253 10*3/uL (ref 150–400)
RDW: 14.1 % (ref 11.5–15.5)

## 2011-03-19 LAB — CBC
Hemoglobin: 10.8 g/dL — ABNORMAL LOW (ref 12.0–15.0)
MCH: 26.9 pg (ref 26.0–34.0)
Platelets: 209 10*3/uL (ref 150–400)
RBC: 4.01 MIL/uL (ref 3.87–5.11)
WBC: 13 10*3/uL — ABNORMAL HIGH (ref 4.0–10.5)

## 2011-03-19 LAB — BASIC METABOLIC PANEL
BUN: 11 mg/dL (ref 6–23)
Calcium: 8.1 mg/dL — ABNORMAL LOW (ref 8.4–10.5)
Chloride: 99 mEq/L (ref 96–112)
Creatinine, Ser: 0.49 mg/dL (ref 0.4–1.2)
GFR calc Af Amer: >60 mL/min (ref >60)
GFR calc non Af Amer: >60 mL/min (ref >60)

## 2011-03-19 LAB — PROTIME-INR: Prothrombin Time: 31.6 seconds — ABNORMAL HIGH (ref 11.6–15.2)

## 2011-03-19 LAB — GLUCOSE, CAPILLARY
Glucose-Capillary: 113 mg/dL — ABNORMAL HIGH (ref 70–99)
Glucose-Capillary: 109 mg/dL — ABNORMAL HIGH (ref 70–99)

## 2011-03-19 LAB — ABO/RH: ABO/RH(D): AB POS

## 2011-03-20 ENCOUNTER — Other Ambulatory Visit: Payer: Self-pay | Admitting: General Surgery

## 2011-03-20 LAB — BASIC METABOLIC PANEL
CO2: 27 mEq/L (ref 19–32)
GFR calc non Af Amer: >60 mL/min (ref >60)
Glucose, Bld: 128 mg/dL — ABNORMAL HIGH (ref 70–99)
Potassium: 3.5 mEq/L (ref 3.5–5.1)
Sodium: 135 mEq/L (ref 135–145)

## 2011-03-20 LAB — PROTIME-INR: INR: 1.23 (ref 0.00–1.49)

## 2011-03-20 LAB — CBC
HCT: 30.3 % — ABNORMAL LOW (ref 36.0–46.0)
HCT: 28.3 % — ABNORMAL LOW (ref 36.0–46.0)
MCH: 26.9 pg (ref 26.0–34.0)
MCHC: 33.2 g/dL (ref 30.0–36.0)
MCV: 81.2 fL (ref 78.0–100.0)
RBC: 3.73 MIL/uL — ABNORMAL LOW (ref 3.87–5.11)
RDW: 14.4 % (ref 11.5–15.5)
WBC: 9.9 10*3/uL (ref 4.0–10.5)

## 2011-03-20 LAB — GLUCOSE, CAPILLARY: Glucose-Capillary: 120 mg/dL — ABNORMAL HIGH (ref 70–99)

## 2011-03-20 LAB — PREPARE FRESH FROZEN PLASMA
Unit division: 0
Unit division: 0

## 2011-03-21 ENCOUNTER — Inpatient Hospital Stay (HOSPITAL_COMMUNITY): Payer: Medicare Other

## 2011-03-21 LAB — COMPREHENSIVE METABOLIC PANEL
ALT: 271 U/L — ABNORMAL HIGH (ref 0–35)
AST: 156 U/L — ABNORMAL HIGH (ref 0–37)
Albumin: 2.3 g/dL — ABNORMAL LOW (ref 3.5–5.2)
Calcium: 7.7 mg/dL — ABNORMAL LOW (ref 8.4–10.5)
Creatinine, Ser: 0.61 mg/dL (ref 0.4–1.2)
GFR calc non Af Amer: >60 mL/min (ref >60)
Sodium: 136 mEq/L (ref 135–145)
Total Protein: 5.8 g/dL — ABNORMAL LOW (ref 6.0–8.3)

## 2011-03-21 LAB — DIFFERENTIAL
Basophils Relative: 0 % (ref 0–1)
Eosinophils Absolute: 0.1 10*3/uL (ref 0.0–0.7)
Eosinophils Relative: 2 % (ref 0–5)
Lymphs Abs: 2 10*3/uL (ref 0.7–4.0)
Monocytes Absolute: 1 10*3/uL (ref 0.1–1.0)
Monocytes Relative: 12 % (ref 3–12)
Neutrophils Relative %: 62 % (ref 43–77)

## 2011-03-21 LAB — CBC
HCT: 30.1 % — ABNORMAL LOW (ref 36.0–46.0)
MCH: 26.6 pg (ref 26.0–34.0)
MCH: 28.1 pg (ref 26.0–34.0)
MCHC: 32.8 g/dL (ref 30.0–36.0)
MCHC: 33.6 g/dL (ref 30.0–36.0)
MCV: 81.2 fL (ref 78.0–100.0)
MCV: 83.8 fL (ref 78.0–100.0)
Platelets: 244 10*3/uL (ref 150–400)
Platelets: 187 10*3/uL (ref 150–400)
RBC: 3.08 MIL/uL — ABNORMAL LOW (ref 3.87–5.11)
RDW: 14.7 % (ref 11.5–15.5)
RDW: 14.9 % (ref 11.5–15.5)

## 2011-03-21 LAB — GLUCOSE, CAPILLARY
Glucose-Capillary: 122 mg/dL — ABNORMAL HIGH (ref 70–99)
Glucose-Capillary: 119 mg/dL — ABNORMAL HIGH (ref 70–99)
Glucose-Capillary: 139 mg/dL — ABNORMAL HIGH (ref 70–99)

## 2011-03-21 LAB — T4, FREE: Free T4: 1 ng/dL (ref 0.80–1.80)

## 2011-03-21 LAB — SURGICAL PCR SCREEN: MRSA, PCR: NEGATIVE

## 2011-03-21 LAB — MAGNESIUM: Magnesium: 2 mg/dL (ref 1.5–2.5)

## 2011-03-21 LAB — T3, FREE: T3, Free: 1.2 pg/mL — ABNORMAL LOW (ref 2.3–4.2)

## 2011-03-21 LAB — CARDIAC PANEL(CRET KIN+CKTOT+MB+TROPI)
CK, MB: 7.7 ng/mL (ref 0.3–4.0)
Troponin I: <0.3 ng/mL (ref <0.30)

## 2011-03-22 LAB — GLUCOSE, CAPILLARY
Glucose-Capillary: 129 mg/dL — ABNORMAL HIGH (ref 70–99)
Glucose-Capillary: 147 mg/dL — ABNORMAL HIGH (ref 70–99)

## 2011-03-22 LAB — HEMOGLOBIN AND HEMATOCRIT, BLOOD: Hemoglobin: 10.6 g/dL — ABNORMAL LOW (ref 12.0–15.0)

## 2011-03-22 LAB — CROSSMATCH
ABO/RH(D): AB POS
Unit division: 0

## 2011-03-22 LAB — COMPREHENSIVE METABOLIC PANEL
Alkaline Phosphatase: 119 U/L — ABNORMAL HIGH (ref 39–117)
BUN: 10 mg/dL (ref 6–23)
Creatinine, Ser: 0.5 mg/dL (ref 0.4–1.2)
GFR calc Af Amer: >60 mL/min (ref >60)
Glucose, Bld: 128 mg/dL — ABNORMAL HIGH (ref 70–99)
Potassium: 3.5 mEq/L (ref 3.5–5.1)
Total Protein: 5.4 g/dL — ABNORMAL LOW (ref 6.0–8.3)

## 2011-03-22 LAB — PROTIME-INR: Prothrombin Time: 15.3 seconds — ABNORMAL HIGH (ref 11.6–15.2)

## 2011-03-22 LAB — CBC
MCH: 28.1 pg (ref 26.0–34.0)
MCHC: 33.8 g/dL (ref 30.0–36.0)
MCV: 83.1 fL (ref 78.0–100.0)
Platelets: 193 10*3/uL (ref 150–400)
RDW: 14.7 % (ref 11.5–15.5)

## 2011-03-23 ENCOUNTER — Inpatient Hospital Stay (HOSPITAL_COMMUNITY): Payer: Medicare Other

## 2011-03-23 LAB — DIFFERENTIAL
Basophils Absolute: 0 10*3/uL (ref 0.0–0.1)
Eosinophils Absolute: 0.3 10*3/uL (ref 0.0–0.7)
Eosinophils Relative: 3 % (ref 0–5)
Lymphocytes Relative: 15 % (ref 12–46)
Lymphs Abs: 1.6 10*3/uL (ref 0.7–4.0)
Monocytes Absolute: 0.9 10*3/uL (ref 0.1–1.0)

## 2011-03-23 LAB — CBC
HCT: 30.3 % — ABNORMAL LOW (ref 36.0–46.0)
MCHC: 34 g/dL (ref 30.0–36.0)
MCV: 82.6 fL (ref 78.0–100.0)
RDW: 14.5 % (ref 11.5–15.5)

## 2011-03-23 LAB — BASIC METABOLIC PANEL
CO2: 30 mEq/L (ref 19–32)
Calcium: 8.1 mg/dL — ABNORMAL LOW (ref 8.4–10.5)
Creatinine, Ser: <0.47 mg/dL (ref 0.4–1.2)
Glucose, Bld: 158 mg/dL — ABNORMAL HIGH (ref 70–99)

## 2011-03-23 LAB — GLUCOSE, CAPILLARY
Glucose-Capillary: 100 mg/dL — ABNORMAL HIGH (ref 70–99)
Glucose-Capillary: 116 mg/dL — ABNORMAL HIGH (ref 70–99)

## 2011-03-23 LAB — POTASSIUM: Potassium: 3 mEq/L — ABNORMAL LOW (ref 3.5–5.1)

## 2011-03-24 LAB — CBC
Hemoglobin: 9.8 g/dL — ABNORMAL LOW (ref 12.0–15.0)
MCH: 28.3 pg (ref 26.0–34.0)
Platelets: 310 10*3/uL (ref 150–400)
RBC: 3.46 MIL/uL — ABNORMAL LOW (ref 3.87–5.11)
WBC: 12.3 10*3/uL — ABNORMAL HIGH (ref 4.0–10.5)

## 2011-03-24 LAB — BASIC METABOLIC PANEL
BUN: 6 mg/dL (ref 6–23)
GFR calc Af Amer: >60 mL/min (ref >60)
GFR calc non Af Amer: >60 mL/min (ref >60)
Potassium: 3.5 mEq/L (ref 3.5–5.1)
Sodium: 139 mEq/L (ref 135–145)

## 2011-03-24 LAB — GLUCOSE, CAPILLARY: Glucose-Capillary: 105 mg/dL — ABNORMAL HIGH (ref 70–99)

## 2011-03-24 LAB — PROTIME-INR
INR: 1.82 — ABNORMAL HIGH (ref 0.00–1.49)
Prothrombin Time: 21.2 seconds — ABNORMAL HIGH (ref 11.6–15.2)

## 2011-03-29 NOTE — Group Therapy Note (Signed)
[name], [name]  [id number]  MEDICAL RECORD NO.:  [id number]  LOCATION:  2019                         FACILITY:  [facility]  PHYSICIAN:  Erick Blinks, MD     DATE OF BIRTH:  March 27, 1935                                PROGRESS NOTE   PRIMARY CARE PHYSICIAN: Brooke Bonito, MD  CARDIOLOGIST: [place] Hilty, MD  CONSULTANTS ON CASE: 1. [facility]. 2. [facility].  CURRENT DIAGNOSES: 1. Severe acute cholecystitis with Fitz-Hugh-Curtis inflammation of     the perihepatic area, status post open cholecystectomy. 2. Transient atrial flutter postoperatively, currently back in sinus     rhythm. 3. History of paroxysmal atrial fibrillation. 4. Hypertension. 5. Diabetes. 6. Hypothyroidism. 7. Hyperlipidemia. 8. Obesity.  ADMISSION HISTORY: This is a 76 year old African American female who was admitted on March 16, 2011, with complaints of abdominal pain, nausea and vomiting.  Thepatient was recently seen in the emergency room on March 15, 2011, for similar symptoms.  She elected to go home at that time with an outpatient followup.  When her symptoms did not resolve, she returned to the emergency room.  She was unable to tolerate any p.o. and she was having mostly epigastric and right upper quadrant pain.  CT of the abdomen and pelvis had shown mild distended gallbladder, and increased caliber pancreatic duct.  The patient was subsequently admitted to the hospital for further treatment of cholecystitis.  HOSPITAL COURSE: 1. Acute cholecystitis.  The patient did have to a HIDA scan, which     showed nonfilling of the gallbladder to 70 minutes cannot exclude     acute cholecystitis.  She was seen in consultation by [facility] and underwent an open cholecystectomy on March 20, 2011, without complications.  She has been continued on antibiotic     coverage with Zosyn and surgery is continuing to follow. 2. Atrial  flutter.  The patient does have a history of paroxysmal     atrial fibrillation.  Postoperatively, she went to a rapid rate at     a 150, which was found to be atrial flutter.  She was transferred     to the Step-Down Unit for further care.  She was seen in     consultation by [facility].  Luckily,     the patient spontaneously converted.  She was continued on Cardizem     right now and she was loaded with amiodarone yesterday and she is     currently on p.o. amiodarone.  Regarding her anticoagulation     status, we are not resume her with any Lovenox or heparin.  She has     been started on Coumadin and Pharmacy will be adjusting. 3. Hypoxia.  The patient was noted to be somewhat short of breath.  X-     ray revealed the right lower lobe atelectasis versus pneumonia.     This is on numerous serial chest x-rays.  Of note, the patient is     currently not febrile, nor does she have a significant cough, but  due to her symptoms which prompted her transferred to the Step-Down     Unit, vancomycin was added to her Zosyn to cover for any healthcare-     acquired pneumonia.  If the patient is afebrile tomorrow and again     does not have any significant respiratory symptoms, it would be     reasonable to discontinue her vancomycin.  She will be continued on     Zosyn for intra-abdominal coverage. 4. Anemia.  The patient was found to have a significant anemia     postoperatively with a hemoglobin trending down to 8.2.  There was     not any significant signs of bleeding.  Preoperatively, her     hemoglobin was ranging in 10-11.  She was treated transfused 2     units of PRBCs and her hemoglobin has been stable.  Thereafter, we     will have to monitor her hemoglobin that she has been restarted on     Coumadin.  If her hemoglobin does drop, then her Coumadin will have     to be discontinued. 5. Severe protein calorie malnutrition.  The patient has been given      Glucerna shakes. 6. Diabetes.  Her CBGs have been in reasonable range.  This will have     to be monitored. 7. Disposition.  The patient has been seen by Physical Therapy and has     recommended home health therapy.  We are hopeful to anticipate     discharge home in the next few days depending on her further     clinical course.  DIAGNOSTIC IMAGING: 1. Chest x-ray on March 18, 2011, shows small bilateral pleural     effusions and right lower lobe airspace disease, this could due to     atelectasis or pneumonia. 2. Chest x-ray on March 21, 2011, shows there is worsening aeration     with right basilar atelectasis or infiltrate, probable right small     pleural effusion, central vascular congestion and mild perihilar     interstitial prominence without convincing pulmonary edema, trace     left basilar atelectasis, pleural effusion. 3. Chest x-ray on March 23, 2011, shows right lower lobe opacity     suspicious for pneumonia with atelectasis without elevation of     right hemidiaphragm remains. 4. HIDA scan done on March 17, 2011, shows unknown filling of the     gallbladder to 70 minutes that cannot exclude acute cholecystitis.  That brings [place] up-to-date in the hospital course.     Erick Blinks, MD     JM/MEDQ  D:  03/23/2011  T:  03/23/2011  Job:  [id number]  Electronically Signed by Durward Mallard Gwenivere Hiraldo  on 03/29/2011 02:45:53 AM

## 2011-03-29 NOTE — Discharge Summary (Signed)
NAMEALLE, Karen NO.:  [id number]  MEDICAL RECORD NO.:  [id number]  LOCATION:                                 FACILITY:  PHYSICIAN:  Pleas Koch, MD        DATE OF BIRTH:  03/28/1935  DATE OF ADMISSION: DATE OF DISCHARGE:                              DISCHARGE SUMMARY   Please see full dictation job number [id number] by Dr. Kerry Hough, dated March 23, 2011.  HISTORY OF PRESENT ILLNESS:  The patient was seen by surgery as well as by Cardiology on day of discharge and Cardiology recommended discontinuing the amiodarone.  In addition, they thought that because of her Aflutter, she will need to continue on Coumadin, which she is already on.  The patient is also seen by Surgery and thought process was that because of her continuous slight drainage as well as her being on Zosyn recently, probably she continued Flagyl for intra-abdominal coverage as she had pretty severe cholecystitis.  She was seen on day of discharge and noted to be stable and she had no further issues, although she a white count of 12.3.  PHYSICAL EXAMINATION:  VITAL SIGNS:  Temperature was 98.1, pulse 62, respirations were 18, blood pressure 114-117 over 61-73 and sats were 91% on room air. GENERAL:  She had no pain, chest was clinically clear. HEART:  S1, S2.  No murmurs.  No pallor.  No icterus. ABDOMEN:  Soft.  No staples in the mid epigastrium and lower quadrant.  Urologic issues intact.  DISCHARGE MEDICATIONS:  As follows, 1. Acetaminophen 325 mg two tablets q.4 h. p.r.n. 2. Mupirocin for 10 days, 2% one application b.i.d. 3. Warfarin specialized dosing 1.5 tablets daily except Friday Sunday,     then 2 tabs.  NEW MEDICATIONS: 1. Flagyl 500 mg t.i.d. for 5 days. 2. Metoprolol XL 50 mg 1 tab daily. 3. Diltiazem 120 mg 1 capsule daily. 4. Colace 100 mg daily. 5. Senokot 1 tablet daily p.r.n. 6. Flonase 1 spray daily p.r.n. 7. Detrol LA 2 mg 1 capsule daily. 8. Crestor 10 mg 1 tablet  daily. 9. Multivitamin over-the-counter 1 tab daily. 10.Oxycodone limited prescription 10 mg q.6 h. p.r.n. for 7 days, 28     tablets prescribed. 11.Enteric-coated aspirin 1 tablet daily. 12.Triamterene 50 mg one caps daily. 13.Prilosec 20 mg 1 capsule daily. 14.Ciprofloxacin 500 mg b.i.d.  DIAGNOSES: 1. Intra-abdominal coverage 10 tablets prescribed. 2. Calcium carbonate 500 mg 1 tab t.i.d. 3. Potassium chloride 20 mEq daily. 4. Glyburide/metformin 5/500 one tab b.i.d. 5. Hydrochlorothiazide.  Note:  Dosage changed from 50-25 mg 1 tab     daily. 6. Synthroid 125 mcg 1 tab daily. 7. Vitamin D 2000 units over-the-counter 1 capsule daily.  The patient was reviewed for discharge and was noted to be stable.  She is encouraged to follow with Dr. Lindie Spruce in the near future next week at phone number [phone].  The patient also will follow up with Dr. Rennis Golden in 2 weeks and the patient was instructed on same.  It was a pleasure taking care of this patient.          ______________________________ Pleas Koch, MD  JS/MEDQ  D:  03/24/2011  T:  03/24/2011  Job:  [id number]  Electronically Signed by Pleas Koch MD on 03/29/2011 08:18:03 PM

## 2011-03-30 ENCOUNTER — Encounter (INDEPENDENT_AMBULATORY_CARE_PROVIDER_SITE_OTHER): Payer: Self-pay | Admitting: General Surgery

## 2011-03-30 NOTE — Op Note (Signed)
[name], [name]  [id number]  MEDICAL RECORD NO.:  [id number]  LOCATION:  5151                         FACILITY:  [facility]  PHYSICIAN:  Cherylynn Ridges, M.D.    DATE OF BIRTH:  06-27-1935  DATE OF PROCEDURE:  03/20/2011 DATE OF DISCHARGE:                              OPERATIVE REPORT   PREOPERATIVE DIAGNOSES:  Cholelithiasis and acute cholecystitis.  POSTOPERATIVE DIAGNOSIS:  Severe acute cholecystitis with Fitz-Hugh- Curtis inflammation of the perihepatic area.  PROCEDURE:  Aborted laparoscopic cholecystectomy and open cholecystectomy.  No cholangiogram.  SURGEON:  Cherylynn Ridges, MD  ASSISTANT:  Lorne Skeens. Hoxworth, MD  ANESTHESIA:  General endotracheal.  ESTIMATED BLOOD LOSS:  500 mL.  COMPLICATIONS:  Bleeding.  CONDITION:  Fair, but stable.  INDICATIONS FOR OPERATION:  The patient is a 76 year old female with a history of atrial fibrillation on Coumadin, hypertension, coronary artery disease, type 2 diabetes, who was admitted on March 16, 2011, with acute cholecystitis.  She had been seen earlier and went home thinking that this would resolve with pain medicine, however, currently she requires a cholecystectomy.  FINDINGS:  The gallbladder was markedly thickened with significant amount of adhesions to the gallbladder and also Fitz-Hugh-Curtis type changes of inflammatory adhesions to the anterior abdominal wall above the liver.  The gallbladder was necrotic and could not be taken down laparoscopically.  OPERATION:  The patient was taken to the operating room and placed on the table in supine position.  After an adequate general endotracheal anesthetic was administered, she was prepped and draped in usual sterile manner, exposing the entire abdomen.  After a proper time-out was performed identifying the patient and the procedure to be performed, a supraumbilical midline incision was made using #15 blade.  It was taken down to the midline  fascia.  The fascia was grasped with Kocher clamps x2, then we incised between the Kocher clamps using a 15 blade.  This brought [place] into the preperitoneal space where we used a Kelly clamp while tenting up on the fascia to go into the peritoneal cavity.  Once we had done so, a pursestring suture of 0 Vicryl was passed around the fascial opening.  We secured in a Hasson cannula which was subsequently passed in order to insufflate the abdomen.  Once the Hasson cannula was in place, carbon dioxide gas was insufflated into the peritoneal cavity up to a maximal pressure of 50 mmHg.  Upon inserting the laparoscope with attached camera and light source, we immediately went into an omental, I guess, mass attached to the periumbilical area where we had difficulty getting into the right upper quadrant.  There were significant omental adhesions to the anterior abdominal wall.  We were able to get into a free space in the right upper quadrant.  A 5-mm cannula was passed and we were able to take down some of the midline adhesions; however, during the process of inserting the initial cannula, some omental tearing was causing bleeding in the omentum which we made multiple attempts to control including harmonic scalpel and Endoclips, but we could not.  Also in the process of dissecting out mini veins, we never did see the  gallbladder.  There appeared to be a very dense inflammatory sort of process over the gallbladder, so a decision was made to convert to an open procedure.  We connected the midline subxiphoid cannula site and the medial most 5- mm cannula site with a #10 blade into the subcutaneous tissue.  Then, we dissected down to and through the anterior rectus fascia, the rectus muscle and then we were able to get through the posterior sheath using electrocautery.  A Thompson bar retractor with self-retaining retractor was used to assist in the procedure.  We used lap tapes to soak up the blood  that had been lost during the time of the laparoscopic portion.  There was minimal bleeding at this point, however, we did find the area of the omentum that had been torn during the insertion of the cannula and this was controlled with cautery and subsequently with clips and also with Surgicel Snow.  We were able to get into a plane between the dense omental adhesions to the gallbladder and gallbladder itself and we found that it was necrotically and markedly enlarged.  With the Omni retractor in place and subsequently retractors in place, we were able to do top-down retrograde cholecystectomy.  We started out by getting into the capsule with electrocautery at the dome and then eventually we were able to get into a plane where we were able to take the gallbladder off the gallbladder bed, mainly with manual dissection with the surgeon's fingers down to the area of the cystic duct and the cystic artery.  Both structures were seen clearly and clipped proximally and distally.  At least 2 distal clips were placed on the cystic duct.  No cholangiogram was performed.  The patient had a necrotic gallbladder which appeared to be completely empty.  We did decompress it prior to tying to taking out. Once we had the duct and the artery control, the last inflammatory and capsule adhesions were taken down and the gallbladder was removed.  The gallbladder bed was controlled with electrocautery, packing, and also Surgicel Snow.  We irrigated with about a liter of saline solution.  There was minimal bleeding.  We placed a 19-French Blake drain into [name] and brought it out the lateral most cannula sites, securing it in place with a 2-0 nylon.  Once this was done, we closed the fascia posterior and anterior sheaths using running looped and #1 PDS suture.  We closed the skin using stainless steel staples.  The supraumbilical incision site was closed using staples.  All counts were correct.   Sterile dressing was applied.     Cherylynn Ridges, M.D.     JOW/MEDQ  D:  03/20/2011  T:  03/21/2011  Job:  [id number]  Electronically Signed by Jimmye Norman M.D. on 03/30/2011 08:02:59 AM

## 2011-03-30 NOTE — Consult Note (Signed)
[name], [name]  [id number]  MEDICAL RECORD NO.:  [id number]  LOCATION:  [facility]                         FACILITY:  [facility]  PHYSICIAN:  Cherylynn Ridges, M.D.    DATE OF BIRTH:  07/06/1935  DATE OF CONSULTATION: DATE OF DISCHARGE:                                CONSULTATION   REFERRING PHYSICIAN:  Dr. Weldon Inches, MD  PRIMARY CARE:  Brooke Bonito, MD  CHIEF COMPLAINT:  Epigastric and suprapubic pain.  BRIEF HISTORY:  The patient is a 76 year old African American female presented with pain onset to start Saturday afternoon March 13, 2011, in the epigastric area.  It is near where her reflux is, but difference is she kept saying it is more likely urinary tract infection.  It was not better or worse with food, it continued up until she presented to the ER around midnight last night.  After treatment, her symptoms have improved and currently she is asymptomatic.  She denies any nausea, vomiting.  No diarrhea.  No constipation.  Again, she describes it like an urinary tract infection.  She says that right now she is more interested in going home than being admitted.  PAST MEDICAL HISTORY: 1. She was seen and hospitalized March 28 through January 09, 2011 with     proximal atrial fibrillation. 2. History of hypertension. 3. Hyperthyroid during the last admission, iatrogenic. 4. Adult-onset diabetes mellitus, non-insulin dependent for the last 6     months. 5. Noncardiac chest pain. 6. GERD, history of hiatal hernia. 7. History of recurrent UTIs when she was younger. 8. Hiatal hernia by EGD August 2003. 9. Colon polyps August 2003 and March 2006. 10.Echocardiogram January 08, 2011, shows an EF of 60-65 with moderate     MR.  PAST SURGICAL HISTORY: 1. She has had polyps removed from a colon x2 and uterine cyst. 2. Tubal ligation.  FAMILY HISTORY:  Father died in his late 33s with coronary artery disease.  Mother died at 58 with diabetes and coronary artery  disease. She has 2 twin brothers, both with high blood pressure.  Three sisters, one is deceased from diabetes, the other has diabetes currently and one is in good health.  SOCIAL HISTORY:  She smoked for 50 years less than 1 pack per day. Alcohol none.  Drugs none.  She has worked as a Environmental education officer, currently retired.  REVIEW OF SYSTEMS:  FEVER:  None.  SKIN:  No changes.  PSYCH:  No changes.  PULMONARY:  No orthopnea.  No PND.  No dyspnea on exertion. No coughing or wheezing.  CARDIAC:  Atrial fib.  No other heart problems.  GI:  Positive for GERD.  No nausea, vomiting, diarrhea, constipation or blood.  GU:  She says her pain feels like an urinary tract infection.  No odor or discomfort voiding.  EXTREMITIES:  Lower extremities, occasional edema when she is up on her feet.  It is better at night.  Claudication none.  MUSCULOSKELETAL:  Positive for mild bursitis.  CEREBROVASCULAR:  No history of stroke, seizure or syncope.  DISCHARGE MEDICATIONS:  The patient says they are the same as her discharge last admission.  I do not have this available but we do know she is on Coumadin.  ALLERGIES:  None.  PHYSICAL EXAMINATION:  VITAL SIGNS:  Temperature on admission was 98.5, at 10 o'clock also 98.5.  Blood pressure on admission 180/67, last blood pressure 161/79.  Respiratory rate has been 16-20, sats are 98-99% on room air, heart rate is 58 on admission, last one was 63. HEENT:  Head:  Normocephalic.  Ear, nose, throat and mouth are all within normal limits. NECK:  Trachea is in the midline.  No bruits.  No JVD.  No thyromegaly. CHEST:  Clear to auscultation.  No wheezes or rhonchi. CARDIAC:  Normal S1.  Split S2.  No murmurs or rubs.  Pulses are +2 and equal in both the upper and lower extremities. ABDOMEN:  Soft, nontender.  Positive bowel sounds.  No palpable hepatosplenomegaly.  No hernia, masses or abscess. GU/RECTAL:  Deferred. LYMPHADENOPATHY:  No palpable  cervical, axillary or femoral adenopathy. SKIN:  No changes. MUSCULOSKELETAL:  No changes. NEUROLOGIC:  Cranial nerves are intact.  II-XII no focal changes. PSYCH:  Normal affect.  LABORATORY FINDINGS:  Sodium is 137, potassium is 2.5, chloride is 94, CO2 is 31, BUN is 13, creatinine is 0.6, glucose 12.5, lipase 22, total bilirubin 0.3, alk phos 68, SGOT 13, SGPT 14, protime 30.7, INR 2.94. White count is 8.2, hemoglobin 11.5, hematocrit 35, platelets 241,000. UA shows 11-20 white cells per high-powered field.  CT scan:  Granuloma of right hepatic lobe, moderate gallbladder distention.  No stones or inflammation.  No changes noted, common bile duct was 7 mm.  Proximal pancreatic duct is increased at 6.8 mm.  There are no stones or masses seen on the CT.  There is no intrahepatic or pancreatic parenchymal changes noted.  IMPRESSION: 1. Epigastric pain with nondilated gallbladder.  Normal LFTs.  Normal     lipase with a proximal pancreatic duct dilatation. 2. Hypokalemia. 3. Probable urinary tract infection. 4. History of atrial fibrillation, on Coumadin. 5. Adult-onset diabetes mellitus. 6. Hypothyroid. 7. Gastroesophageal reflux disease/hiatal hernia. 8. History of tobacco use.  PLAN:  This has been reviewed with Dr. Lindie Spruce.  His recommendation is to get a MRCP for further delineation of her pancreas, gallbladder, further workup and evaluation as needed.  We will see and follow with you.     Eber Hong, P.A.   ______________________________ Cherylynn Ridges, M.D.    WDJ/MEDQ  D:  03/15/2011  T:  03/15/2011  Job:  [id number]  Electronically Signed by Sherrie George P.A. on 03/25/2011 03:58:08 PM Electronically Signed by Jimmye Norman M.D. on 03/30/2011 07:59:59 AM

## 2011-04-03 ENCOUNTER — Other Ambulatory Visit (INDEPENDENT_AMBULATORY_CARE_PROVIDER_SITE_OTHER): Payer: Self-pay | Admitting: General Surgery

## 2011-04-03 LAB — CBC WITH DIFFERENTIAL/PLATELET
Eosinophils Absolute: 0.2 10*3/uL (ref 0.0–0.7)
HCT: 40.2 % (ref 36.0–46.0)
Lymphocytes Relative: 24 % (ref 12–46)
Lymphs Abs: 2.9 10*3/uL (ref 0.7–4.0)
MCHC: 33.1 g/dL (ref 30.0–36.0)
MCV: 84.8 fL (ref 78.0–100.0)
Monocytes Relative: 6 % (ref 3–12)
Neutrophils Relative %: 68 % (ref 43–77)
RDW: 15.3 % (ref 11.5–15.5)

## 2011-04-05 NOTE — H&P (Signed)
NAMESTANISLAWA, [name]  [id number]  MEDICAL RECORD NO.:  [id number]  LOCATION:  5151                         FACILITY:  [facility]  PHYSICIAN:  Eduard Clos, MDDATE OF BIRTH:  November 17, 1934  DATE OF ADMISSION:  03/16/2011 DATE OF DISCHARGE:                             HISTORY & PHYSICAL   PRIMARY CARE PHYSICIAN:  Brooke Bonito, M.D.  CHIEF COMPLAINT:  Abdominal pain.  HISTORY OF PRESENT ILLNESS:  A 76 year old female with known history of atrial fibrillation on Coumadin, hypertension, diabetes mellitus type 2, hyperlipidemia who presented with complaints of persistent abdominal pain.  The patient did come to the ER 2 days ago, had gone home back but since the pain is persistent had come back again.  The patient stated the pain started last week, which is most in the epigastrium and right upper quadrant, has no relation to food. Today, she did have some nausea, vomiting once, has no diarrhea.  Denies any fever, chills.  The patient when she came on March 15, 2011, she did have a CT of the abdomen and pelvis, which at that time showed mild distal gallbladder and increased caliber of pancreatic duct.  MRI of the pancreas was recommended.  The patient denies any chest pain.  Denies any shortness of breath, dizziness, loss of consciousness.  Denies any focal deficit, any dysuria, discharge, or diarrhea.  PAST MEDICAL HISTORY: 1. History of atrial fibrillation on Coumadin. 2. History of hypertension. 3. Hyperlipidemia. 4. Diabetes mellitus type 2. 5. Hypothyroidism status post radiation therapy for hyperthyroidism.  PAST SURGICAL HISTORY:  Hysterectomy, colonoscopy status post polypectomy, tubal ligation.  MEDICATIONS DURING ADMISSION: 1. Coumadin. 2. Vitamin D3. 3. Triamterene 50 mg. 4. Synthroid 125 mcg. 5. Senokot. 6. Prilosec. 7. Potassium chloride. 8. Multivitamin. 9. Hydrochlorothiazide. 10.Glyburide. 11.Metformin 5/500 p.o. twice  daily. 12.Flonase. 13.Diltiazem 120 mg daily. 14.Detrol LA. 15.Crestor. 16.Colace. 17.Calcium carbonate. 18.Aspirin 81 mg daily. 19.Acetaminophen. 20.Metoprolol 50 mg XL 1 tablet daily.  ALLERGIES:  No known drug allergies.  FAMILY HISTORY:  Father died in late 59s with coronary artery disease. Mother died at 7 with diabetes and coronary artery disease.  Family history of hypertension and diabetes mellitus type 2.  SOCIAL HISTORY:  The patient smoked for 50 years.  Denies any alcohol or drug abuse.  She worked as a Geophysicist/field seismologist, currently retired.  REVIEW OF SYSTEMS:  As per history of present illness, nothing else significant.  PHYSICAL EXAMINATION:  GENERAL:  The patient examined at bedside, not in acute distress. VITAL SIGNS:  Blood pressure is 170/70, pulse is 60 per minute, temperature 98.9, respiration 18 per minute, O2 sat 99%. HEENT:  Anicteric.  No pallor.  No discharge from ears, eyes, nose, or mouth. CHEST:  Bilateral air entry present.  No rhonchi, no crepitation. HEART:  S1 and S2 heard. Abdomen: Soft.  There is tenderness in the right upper quadrant and epigastric area.  No guarding, no rigidity.  Bowel sounds present. CNS:  The patient is alert, awake, and oriented to time, place and person.  Moves upper and lower extremities, 5/5. EXTREMITIES:  Peripheral pulses felt.  No edema.  LABORATORY DATA:  EKG shows normal sinus rhythm  with a heart rate around 58 beats per minute with nonspecific ST-T changes.  CT abdomen and pelvis done on March 15, 2011, that is 2 days ago showed mild distention of the gallbladder, increased caliber of the proximal pancreatic duct which measures up to 6.8 mm.  No obstructing stone or mass identified. Consider followup imaging with contrast of enhancement __________ pancreas.  CBC:  WBCs 10.4, hemoglobin is 12.1, hematocrit is 36.5, platelets 30.  Complete metabolic panel:  Sodium 138, potassium 3, chloride 97, carbon dioxide  27, glucose 134, BUN 13, creatinine 0.6, total bilirubin is 0.2, alkaline phosphatase is 79, AST 15, ALT 17, total __________  albumin 4.1, calcium 9.1, lipase 18.  CK is 89, CK-MB 2, troponin I is less than 0.3.  I am going to order a UA.  ASSESSMENT: 1. Abdominal pain. 2. History of hypertension. 3. History of atrial fibrillation with a rate controlled on Coumadin. 4. History of chronic obstructive pulmonary disease. 5. History of diabetes mellitus type 2.  PLAN: 1. At this time, admit the patient to medical floor. 2. For abdominal pain at this time, we are going to get MRCP and HIDA     scan.  The patient will be on liquid diet and pain relief     medications.  We will also get a UA.  Based on these tests, we will     further plan our recommendations.     Eduard Clos, MD     ANK/MEDQ  D:  03/17/2011  T:  03/17/2011  Job:  [id number]  cc:   Brooke Bonito, M.D.  Electronically Signed by Midge Minium MD on 04/05/2011 07:31:08 AM

## 2011-04-13 ENCOUNTER — Ambulatory Visit (INDEPENDENT_AMBULATORY_CARE_PROVIDER_SITE_OTHER): Payer: Medicare Other | Admitting: General Surgery

## 2011-04-13 DIAGNOSIS — Z09 Encounter for follow-up examination after completed treatment for conditions other than malignant neoplasm: Secondary | ICD-10-CM

## 2011-04-13 NOTE — Progress Notes (Signed)
HPI I am happy to report that [name] is doing very well. Her wound is healed well without evidence of infection. She is eating better. Her energy level is still low.  PE Her abdominal wounds healed well with no evidence of infection or hernia  Studiy review There are no studies to review.  Assessment Status post open cholecystectomy.  Plan The patient is back on her Coumadin and doing well. There is a chance that she will be taken off the Coumadin in a few months. She'll be placed on aspirin therapy only.  The patient is allowed to participate in the care of her 56-month-old grandson. He weighs approximately 25 pounds she can go up stairs there are no other restrictions.

## 2011-07-12 LAB — CARDIAC PANEL(CRET KIN+CKTOT+MB+TROPI)
Relative Index: INVALID
Total CK: 63
Troponin I: <0.01

## 2011-07-12 LAB — BASIC METABOLIC PANEL
BUN: 8
CO2: 29
CO2: 28
CO2: 28
Calcium: 8.9
Calcium: 9
Chloride: 99
Chloride: 103
Creatinine, Ser: 0.77
GFR calc Af Amer: >60
GFR calc Af Amer: >60
GFR calc non Af Amer: >60
Glucose, Bld: 115 — ABNORMAL HIGH
Glucose, Bld: 121 — ABNORMAL HIGH
Potassium: 3.6
Potassium: 3.9
Sodium: 138
Sodium: 138

## 2011-07-12 LAB — CBC
HCT: 41
Hemoglobin: 13.6
Hemoglobin: 11.8 — ABNORMAL LOW
MCHC: 33.2
MCHC: 33.2
MCV: 89.3
Platelets: 208
RBC: 4.6
RDW: 14.2
WBC: 8

## 2011-07-12 LAB — POCT I-STAT, CHEM 8
Creatinine, Ser: 0.7
Hemoglobin: 12.9
Potassium: 3.2 — ABNORMAL LOW
Sodium: 138
TCO2: 29

## 2011-07-12 LAB — DIFFERENTIAL
Basophils Relative: 0
Eosinophils Absolute: 0
Eosinophils Relative: 0
Lymphs Abs: 1.1
Monocytes Absolute: 0.4
Monocytes Relative: 6

## 2011-07-12 LAB — PROTIME-INR
INR: 0.9
Prothrombin Time: 12.5

## 2011-07-12 LAB — POCT CARDIAC MARKERS
CKMB, poc: 1.6
Myoglobin, poc: 44.2
Troponin i, poc: <0.05

## 2011-07-12 LAB — LIPID PANEL
HDL: 39 — ABNORMAL LOW
Total CHOL/HDL Ratio: 3.8
VLDL: 32

## 2011-07-12 LAB — CK TOTAL AND CKMB (NOT AT ARMC)
CK, MB: 1.8
Total CK: 93

## 2011-07-12 LAB — TROPONIN I: Troponin I: <0.01

## 2011-09-08 ENCOUNTER — Other Ambulatory Visit: Payer: Self-pay | Admitting: Endocrinology

## 2011-09-08 DIAGNOSIS — Z1231 Encounter for screening mammogram for malignant neoplasm of breast: Secondary | ICD-10-CM

## 2011-10-08 ENCOUNTER — Ambulatory Visit
Admission: RE | Admit: 2011-10-08 | Discharge: 2011-10-08 | Disposition: A | Discharge status: Home / Self Care | Payer: Medicare Other | Source: Ambulatory Visit | Attending: Endocrinology | Admitting: Endocrinology

## 2011-10-08 DIAGNOSIS — Z1231 Encounter for screening mammogram for malignant neoplasm of breast: Secondary | ICD-10-CM

## 2012-05-17 ENCOUNTER — Emergency Department (HOSPITAL_COMMUNITY)
Admission: EM | Admit: 2012-05-17 | Discharge: 2012-05-17 | Disposition: A | Discharge status: Home / Self Care | Payer: Medicare Other | Attending: Emergency Medicine | Admitting: Emergency Medicine

## 2012-05-17 ENCOUNTER — Encounter (HOSPITAL_COMMUNITY): Payer: Self-pay | Admitting: Emergency Medicine

## 2012-05-17 DIAGNOSIS — E119 Type 2 diabetes mellitus without complications: Secondary | ICD-10-CM | POA: Insufficient documentation

## 2012-05-17 DIAGNOSIS — E079 Disorder of thyroid, unspecified: Secondary | ICD-10-CM | POA: Insufficient documentation

## 2012-05-17 DIAGNOSIS — T6391XA Toxic effect of contact with unspecified venomous animal, accidental (unintentional), initial encounter: Secondary | ICD-10-CM | POA: Insufficient documentation

## 2012-05-17 DIAGNOSIS — I10 Essential (primary) hypertension: Secondary | ICD-10-CM | POA: Insufficient documentation

## 2012-05-17 DIAGNOSIS — T63481A Toxic effect of venom of other arthropod, accidental (unintentional), initial encounter: Secondary | ICD-10-CM | POA: Insufficient documentation

## 2012-05-17 DIAGNOSIS — E785 Hyperlipidemia, unspecified: Secondary | ICD-10-CM | POA: Insufficient documentation

## 2012-05-17 MED ORDER — DIPHENHYDRAMINE HCL 25 MG PO CAPS
25.0000 mg | ORAL_CAPSULE | Freq: Once | ORAL | Status: AC
  Administered 2012-05-17: 25 mg via ORAL
  Filled 2012-05-17: qty 1

## 2012-05-17 NOTE — ED Provider Notes (Signed)
History     CSN: [id number]  Arrival date & time 05/17/12  2042   First MD Initiated Contact with Patient 05/17/12 2157      Chief Complaint  Patient presents with  . Insect Bite  . Foot Pain    (Consider location/radiation/quality/duration/timing/severity/associated sxs/prior treatment) HPI Comments: Patient presents with bee sting 3-4 hrs ago on R foot, treated with ice and tobacco which has helped. Swelling stable. No redness or streaking. Stinging pain improved. H/o DM, on coumadin. Sugars controlled on metformin. Onset acute. Course constant. Nothing makes symptoms worse.   Patient is a 77 y.o. female presenting with lower extremity pain. The history is provided by the patient.  Foot Pain Pertinent negatives include no chest pain, fever, myalgias, nausea, rash or vomiting.    Past Medical History  Diagnosis Date  . Hypertension   . Diabetes mellitus   . Thyroid disease   . Hyperlipidemia   . Colon polyp   . Poor appetite     History reviewed. No pertinent past surgical history.  No family history on file.  History  Substance Use Topics  . Smoking status: Never Smoker   . Smokeless tobacco: Not on file  . Alcohol Use: No    OB History    Grav Para Term Preterm Abortions TAB SAB Ect Mult Living                  Review of Systems  Constitutional: Negative for fever.  HENT: Negative for facial swelling and trouble swallowing.   Eyes: Negative for redness.  Respiratory: Negative for shortness of breath, wheezing and stridor.   Cardiovascular: Positive for leg swelling (foot). Negative for chest pain.  Gastrointestinal: Negative for nausea and vomiting.  Musculoskeletal: Negative for myalgias.  Skin: Negative for rash.  Neurological: Negative for light-headedness.  Psychiatric/Behavioral: Negative for confusion.    Allergies  Review of patient's allergies indicates no known allergies.  Home Medications   Current Outpatient Rx  Name Route Sig Dispense  Refill  . DILTIAZEM HCL ER COATED BEADS 120 MG PO CP24 Oral Take 120 mg by mouth daily.     [name] ENOXAPARIN SODIUM 80 MG/0.8ML [place] SOLN Subcutaneous Inject 80 mg into the skin daily.     . GLYBURIDE-METFORMIN 5-500 MG PO TABS Oral Take 1 tablet by mouth daily with breakfast.     . KLOR-CON M20 20 MEQ PO TBCR Oral Take 20 mEq by mouth 2 (two) times daily.     . MEPHYTON 5 MG PO TABS Oral Take 5 mg by mouth daily.     [name] METOPROLOL SUCCINATE ER 50 MG PO TB24 Oral Take 50 mg by mouth daily.     [name] PANTOPRAZOLE SODIUM 40 MG PO TBEC Oral Take 40 mg by mouth daily.     . WARFARIN SODIUM 5 MG PO TABS Oral Take 5 mg by mouth daily. Mondays take 1 whole pill. All other days take 1.5 tablets.      BP 154/66  Pulse 60  Temp 98 F (36.7 C)  Resp 16  SpO2 98%  Physical Exam  Nursing note and vitals reviewed. Constitutional: She appears well-developed and well-nourished.  HENT:  Head: Normocephalic and atraumatic.  Eyes: Conjunctivae are normal.  Neck: Normal range of motion. Neck supple.  Pulmonary/Chest: No respiratory distress.  Musculoskeletal: She exhibits edema and tenderness.       Right knee: Normal.       Right ankle: She exhibits normal range of motion and no swelling.  no tenderness. Achilles tendon normal.       Right foot: She exhibits tenderness and swelling. She exhibits normal range of motion, no bony tenderness, normal capillary refill and no deformity.       Feet:  Neurological: She is alert.  Skin: Skin is warm and dry.  Psychiatric: She has a normal mood and affect.    ED Course  Procedures (including critical care time)  Labs Reviewed - No data to display No results found.   1. Insect sting     10:19 PM Patient seen and examined. Medications ordered.   Vital signs reviewed and are as follows: Filed Vitals:   05/17/12 2114  BP: 154/66  Pulse: 60  Temp: 98 F (36.7 C)  Resp: 16   Patient counseled to continue to use ice, elevation, Benadryl for allergic  reaction. Patient is to monitor the area for worsening redness, swelling or streaking up her leg. If this occurs, patient will followup with primary care physician or return to the emergency department immediately for recheck. Patient verbalizes understanding and agrees with the plan.   MDM  Patients with localized allergic reaction to bee sting. No anaphylaxis. Swelling is stable and mild. No evidence of infection. Patient is a diabetic with sugars are well controlled. No lymphangitis noted. Patient to continue conservative measures.       Renne Crigler, [place] 05/18/12 [phone]

## 2012-05-17 NOTE — ED Notes (Signed)
Pt alert, nad, c/o insect bite to right foot, ? Insect, unknown allergy, resp even unlabored, skin pwd

## 2012-05-18 NOTE — ED Provider Notes (Signed)
Medical screening examination/treatment/procedure(s) were performed by non-physician practitioner and as supervising physician I was immediately available for consultation/collaboration.   Shenay Torti, MD 05/18/12 1602 

## 2012-09-14 ENCOUNTER — Other Ambulatory Visit: Payer: Self-pay | Admitting: Endocrinology

## 2012-09-14 DIAGNOSIS — Z1231 Encounter for screening mammogram for malignant neoplasm of breast: Secondary | ICD-10-CM

## 2012-11-08 ENCOUNTER — Inpatient Hospital Stay: Admission: RE | Admit: 2012-11-08 | Payer: Medicare Other | Source: Ambulatory Visit

## 2012-12-04 ENCOUNTER — Ambulatory Visit
Admission: RE | Admit: 2012-12-04 | Discharge: 2012-12-04 | Disposition: A | Discharge status: Home / Self Care | Payer: Medicare Other | Source: Ambulatory Visit | Attending: Endocrinology | Admitting: Endocrinology

## 2012-12-26 ENCOUNTER — Ambulatory Visit: Payer: Self-pay | Admitting: Internal Medicine

## 2012-12-26 DIAGNOSIS — I480 Paroxysmal atrial fibrillation: Secondary | ICD-10-CM | POA: Insufficient documentation

## 2012-12-26 DIAGNOSIS — Z7901 Long term (current) use of anticoagulants: Secondary | ICD-10-CM

## 2012-12-26 DIAGNOSIS — I4891 Unspecified atrial fibrillation: Secondary | ICD-10-CM

## 2013-03-07 ENCOUNTER — Ambulatory Visit: Payer: Medicare Other | Admitting: Pharmacist Clinician (PhC)/ Clinical Pharmacy Specialist

## 2013-03-09 ENCOUNTER — Ambulatory Visit (INDEPENDENT_AMBULATORY_CARE_PROVIDER_SITE_OTHER): Payer: Medicare Other | Admitting: Pharmacist Clinician (PhC)/ Clinical Pharmacy Specialist

## 2013-03-09 VITALS — BP 104/58 | HR 60

## 2013-03-09 DIAGNOSIS — I4891 Unspecified atrial fibrillation: Secondary | ICD-10-CM

## 2013-03-09 DIAGNOSIS — Z7901 Long term (current) use of anticoagulants: Secondary | ICD-10-CM

## 2013-03-19 ENCOUNTER — Other Ambulatory Visit: Payer: Self-pay | Admitting: Internal Medicine

## 2013-04-09 ENCOUNTER — Ambulatory Visit (INDEPENDENT_AMBULATORY_CARE_PROVIDER_SITE_OTHER): Payer: Medicare Other | Admitting: Pharmacist Clinician (PhC)/ Clinical Pharmacy Specialist

## 2013-04-09 VITALS — BP 100/60 | HR 68

## 2013-04-09 DIAGNOSIS — Z7901 Long term (current) use of anticoagulants: Secondary | ICD-10-CM

## 2013-04-09 DIAGNOSIS — I4891 Unspecified atrial fibrillation: Secondary | ICD-10-CM

## 2013-04-09 LAB — POCT INR: INR: 2.1

## 2013-05-17 ENCOUNTER — Ambulatory Visit (INDEPENDENT_AMBULATORY_CARE_PROVIDER_SITE_OTHER): Payer: Medicare Other | Admitting: Internal Medicine

## 2013-05-17 ENCOUNTER — Ambulatory Visit (INDEPENDENT_AMBULATORY_CARE_PROVIDER_SITE_OTHER): Payer: Medicare Other | Admitting: Pharmacist Clinician (PhC)/ Clinical Pharmacy Specialist

## 2013-05-17 ENCOUNTER — Encounter: Payer: Self-pay | Admitting: Internal Medicine

## 2013-05-17 VITALS — BP 134/80 | HR 66 | Ht 61.5 in | Wt 170.2 lb

## 2013-05-17 DIAGNOSIS — I4891 Unspecified atrial fibrillation: Secondary | ICD-10-CM

## 2013-05-17 DIAGNOSIS — Z7901 Long term (current) use of anticoagulants: Secondary | ICD-10-CM

## 2013-05-17 LAB — POCT INR: INR: 2

## 2013-05-17 NOTE — Progress Notes (Signed)
OFFICE NOTE  Chief Complaint:  Routine office visit  Primary Care Physician: Karen Sites, MD  HPI:  [name] is a 78 year old overweight African American female with a history of paroxysmal atrial fibrillation on Coumadin, hypothyroidism, dyslipidemia, diabetes mellitus type 2, hypertension.  She had an episode of PAF in 2014, but this went away fairly quickly and she is not been symptomatic since then. This may be related to her not taking her medications as scheduled. Since then she's been taking her metoprolol twice daily and has had no real problems. Her warfarin level remained therapeutic at 2.    PMHx:  Past Medical History  Diagnosis Date  . Hypertension   . Diabetes mellitus   . Thyroid disease   . Hyperlipidemia   . Colon polyp   . Poor appetite     History reviewed. No pertinent past surgical history.  FAMHx:  History reviewed. No pertinent family history.  SOCHx:   reports that she quit smoking about 10 years ago. Her smoking use included Cigarettes. She smoked 0.00 packs per day. She has never used smokeless tobacco. She reports that she does not drink alcohol or use illicit drugs.  ALLERGIES:  No Known Allergies  ROS: A comprehensive review of systems was negative except for: Respiratory: positive for dyspnea on exertion  HOME MEDS: Current Outpatient Prescriptions  Medication Sig Dispense Refill  . diltiazem (CARDIZEM CD) 120 MG 24 hr capsule TAKE 1 TABLET BY MOUTH EVERY DAY  30 capsule  6  . glyBURIDE-metformin (GLUCOVANCE) 5-500 MG per tablet Take 1 tablet by mouth daily with breakfast.       . hydrochlorothiazide (HYDRODIURIL) 50 MG tablet Take 50 mg by mouth daily.      [name] KLOR-CON M20 20 MEQ tablet Take 20 mEq by mouth 2 (two) times daily.       [name] LANTUS SOLOSTAR 100 UNIT/ML SOPN 30 Units daily.      . metFORMIN (GLUCOPHAGE) 500 MG tablet Take 500 mg by mouth daily.      . metoprolol (TOPROL-XL) 50 MG 24 hr tablet Take 50 mg by mouth daily.        [name] SYNTHROID 100 MCG tablet Take 100 mcg by mouth daily.      [name] warfarin (COUMADIN) 5 MG tablet Take 5 mg by mouth daily. Mondays take 1 whole pill. All other days take 1.5 tablets.       No current facility-administered medications for this visit.    LABS/IMAGING: Results for orders placed in visit on 05/17/13 (from the past 48 hour(s))  POCT INR     Status: None   Collection Time    05/17/13 11:12 AM      Result Value Range   INR 2.0     No results found.  VITALS: BP 134/80  Pulse 66  Ht 5' 1.5" (1.562 m)  Wt 170 lb 3 oz (77.197 kg)  BMI 31.64 kg/m2  EXAM: General appearance: alert and no distress Neck: no adenopathy, no carotid bruit, no JVD, supple, symmetrical, trachea midline and thyroid not enlarged, symmetric, no tenderness/mass/nodules Lungs: clear to auscultation bilaterally Heart: regular rate and rhythm, S1, S2 normal, no murmur, click, rub or gallop Abdomen: soft, non-tender; bowel sounds normal; no masses,  no organomegaly Extremities: extremities normal, atraumatic, no cyanosis or edema Pulses: 2+ and symmetric Skin: Skin color, texture, turgor normal. No rashes or lesions Neurologic: Grossly normal  EKG: NSR at 66  ASSESSMENT: 1. Paroxysmal atrial fibrillation 2. Hypertension 3. Diabetes type 2  4. Hypothyroidism 5. Dyslipidemia  PLAN: 1.   [name] is doing well with regards to atrial fibrillation. She had one breakthrough episode in January but has none since then. This may have been related to not taking her medications correctly. She is therapeutic on warfarin and therefore at very low risk of stroke. I do not see an indication for antiarrhythmic therapy at this time unless she has more frequent episodes of atrial fibrillation. We'll continue her current medications and we'll see her back annually.  Karen Nose, MD, [facility] Attending Cardiologist [facility]  HILTY,[name] 05/17/2013, 12:51 PM

## 2013-05-17 NOTE — Patient Instructions (Addendum)
Your physician wants you to follow-up in: 1 year. You will receive a reminder letter in the mail two months in advance. If you don't receive a letter, please call our office to schedule the follow-up appointment.  

## 2013-05-23 ENCOUNTER — Ambulatory Visit: Payer: Medicare Other | Admitting: Pharmacist Clinician (PhC)/ Clinical Pharmacy Specialist

## 2013-07-02 ENCOUNTER — Ambulatory Visit (INDEPENDENT_AMBULATORY_CARE_PROVIDER_SITE_OTHER): Payer: Medicare Other | Admitting: Pharmacist Clinician (PhC)/ Clinical Pharmacy Specialist

## 2013-07-02 DIAGNOSIS — Z7901 Long term (current) use of anticoagulants: Secondary | ICD-10-CM

## 2013-07-02 DIAGNOSIS — I4891 Unspecified atrial fibrillation: Secondary | ICD-10-CM

## 2013-07-02 LAB — POCT INR: INR: 3.1

## 2013-07-10 ENCOUNTER — Ambulatory Visit (INDEPENDENT_AMBULATORY_CARE_PROVIDER_SITE_OTHER): Payer: Medicare Other | Admitting: Cardiology

## 2013-07-10 ENCOUNTER — Encounter: Payer: Self-pay | Admitting: Cardiology

## 2013-07-10 VITALS — BP 124/82 | HR 126 | Ht 61.5 in | Wt 172.9 lb

## 2013-07-10 DIAGNOSIS — I4891 Unspecified atrial fibrillation: Secondary | ICD-10-CM

## 2013-07-10 DIAGNOSIS — I10 Essential (primary) hypertension: Secondary | ICD-10-CM | POA: Insufficient documentation

## 2013-07-10 DIAGNOSIS — E1165 Type 2 diabetes mellitus with hyperglycemia: Secondary | ICD-10-CM | POA: Insufficient documentation

## 2013-07-10 DIAGNOSIS — E119 Type 2 diabetes mellitus without complications: Secondary | ICD-10-CM

## 2013-07-10 DIAGNOSIS — Z7901 Long term (current) use of anticoagulants: Secondary | ICD-10-CM

## 2013-07-10 DIAGNOSIS — J069 Acute upper respiratory infection, unspecified: Secondary | ICD-10-CM

## 2013-07-10 NOTE — Assessment & Plan Note (Signed)
IDDM 

## 2013-07-10 NOTE — Assessment & Plan Note (Signed)
Break though AF with RVR since last week

## 2013-07-10 NOTE — Assessment & Plan Note (Signed)
Controlled.  

## 2013-07-10 NOTE — Assessment & Plan Note (Signed)
INR 3.1

## 2013-07-10 NOTE — Progress Notes (Signed)
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 She has a history of PAF. She has not required cardioversion, she usually converts on her own. She developed an URI about 10 days ago and says she took Mucinex-D. She improved from that standpoint but has noticed increasing fatigue and DOE. She noted palpitations this weekend. She saw her primary care provider and rapid AF was confirmed. She is sen now for follow up. Today she feels better but she is still in AF with a rate of 140, She denies chest pain or orthopnea.    Current Outpatient Prescriptions  Medication Sig Dispense Refill  . diltiazem (CARDIZEM CD) 120 MG 24 hr capsule TAKE 1 TABLET BY MOUTH EVERY DAY  30 capsule  6  . glyBURIDE-metformin (GLUCOVANCE) 5-500 MG per tablet Take 1 tablet by mouth daily with breakfast.       . hydrochlorothiazide (HYDRODIURIL) 50 MG tablet Take 50 mg by mouth daily.      [name] KLOR-CON M20 20 MEQ tablet Take 20 mEq by mouth 2 (two) times daily.       [name] LANTUS SOLOSTAR 100 UNIT/ML SOPN 30 Units daily.      . metFORMIN (GLUCOPHAGE) 500 MG tablet Take 500 mg by mouth daily.      . metoprolol (TOPROL-XL) 50 MG 24 hr tablet Take 50 mg by mouth daily.       [name] SYNTHROID 100 MCG tablet Take 100 mcg by mouth daily.      [name] warfarin (COUMADIN) 5 MG tablet Take 5 mg by mouth daily. Mondays take 1 whole pill. All other days take 1.5 tablets.       No current facility-administered medications for this visit.    No Known Allergies  History   Social History  . Marital Status: Widowed    Spouse Name: N/A    Number of Children: N/A  . Years of Education: N/A   Occupational History  . Not on file.   Social History Main Topics  . Smoking status: Former Smoker    Types: Cigarettes    Quit date: 05/18/2003   . Smokeless tobacco: Never Used  . Alcohol Use: No  . Drug Use: No  . Sexual Activity: Not on file   Other Topics Concern  . Not on file   Social History Narrative  . No narrative on file     Review of Systems: General: negative for chills, fever, night sweats or weight changes.  Cardiovascular: negative for chest pain,  Orthopnea, paroxysmal nocturnal dyspnea  Dermatological: negative for rash Respiratory: negative for cough  Urologic: negative for hematuria Abdominal: negative for nausea, vomiting, diarrhea, bright red blood per rectum, melena, or hematemesis Neurologic: negative for visual changes, syncope, or dizziness All other systems reviewed and are otherwise negative except as noted above. Low risk Myoview 2007. Nl LVF by 2D 3/13    Blood pressure 124/82, pulse 126, height 5' 1.5" (1.562 m), weight 172 lb 14.4 oz (78.427 kg).  General appearance: alert, cooperative and no distress Lungs: clear to auscultation bilaterally Heart: irregularly irregular rhythm Extremities: trace edema  EKG AF with rapid VR  ASSESSMENT AND PLAN:   PAF (paroxysmal atrial fibrillation) Break though AF with RVR since last week  Long term (current) use of anticoagulants INR 3.1  HTN (hypertension) Controlled  Diabetes mellitus IDDM  Upper respiratory infection One week ago took Mucinex-D   PLAN  I increased her Diltiazem to 120 mg BID. She'll return in 48 hrs for follow up. If she is still in AF she may need to be set up for DCCV. I advised against taking any decongestants with a "D" in them.  [name] KPA-C 07/10/2013 4:12 PM

## 2013-07-10 NOTE — Assessment & Plan Note (Signed)
One week ago took Mucinex-D

## 2013-07-10 NOTE — Patient Instructions (Addendum)
Anticoagulation Dose Instructions as of 07/02/2013      Total Sun Mon Tue Wed Thu Fri Sat   New Dose 42.5 mg 7.5 mg 5 mg 7.5 mg 5 mg 7.5 mg 5 mg 5 mg     (5 mg x 1.5)  (5 mg x 1)  (5 mg x 1.5)  (5 mg x 1)  (5 mg x 1.5)  (5 mg x 1)  (5 mg x 1)               Increase Diltiazem to 120 mg twice a day. Return to clinic Thursday afternoon

## 2013-07-12 ENCOUNTER — Ambulatory Visit (INDEPENDENT_AMBULATORY_CARE_PROVIDER_SITE_OTHER): Payer: Medicare Other | Admitting: Physician Assistant

## 2013-07-12 ENCOUNTER — Encounter: Payer: Self-pay | Admitting: Physician Assistant

## 2013-07-12 VITALS — BP 120/70 | HR 118 | Ht 61.0 in

## 2013-07-12 DIAGNOSIS — I4891 Unspecified atrial fibrillation: Secondary | ICD-10-CM

## 2013-07-12 DIAGNOSIS — I480 Paroxysmal atrial fibrillation: Secondary | ICD-10-CM

## 2013-07-12 MED ORDER — DILTIAZEM HCL ER COATED BEADS 180 MG PO CP24: 180.0000 mg | ORAL_CAPSULE | Freq: Two times a day (BID) | ORAL | Status: DC

## 2013-07-12 NOTE — Progress Notes (Signed)
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 She has a history of PAF. She has not required cardioversion, she usually converts on her own. She developed an URI about 10 days ago and says she took Mucinex-D. She improved from that standpoint but has noticed increasing fatigue and DOE. She noted palpitations this weekend. She saw her primary care provider and rapid AF was confirmed. She saw Corine Shelter, [place] two days ago and her rate was 140.  He increased her diltiazem to 120mg  BID.  She presents today for follow EKG.   She "feels much better" and is "not nearly as tired".       The patient currently denies nausea, vomiting, fever, chest pain, shortness of breath, orthopnea, dizziness, PND, cough, congestion, abdominal pain, hematochezia, melena.  Wt Readings from Last 3 Encounters:  07/10/13 172 lb 14.4 oz (78.427 kg)  05/17/13 170 lb 3 oz (77.197 kg)     Past Medical History  Diagnosis Date  . Hypertension   . Diabetes mellitus   . Thyroid disease   . Hyperlipidemia   . Colon polyp   . Poor appetite     Current Outpatient Prescriptions  Medication Sig Dispense Refill  . cephALEXin (KEFLEX) 500 MG capsule       . diltiazem (CARDIZEM CD) 120 MG 24 hr capsule 120 mg 2 (two) times daily.       [name] glyBURIDE-metformin (GLUCOVANCE) 5-500 MG per tablet Take 1 tablet by mouth daily with breakfast.       . hydrochlorothiazide (HYDRODIURIL) 50 MG tablet Take 50 mg by mouth daily.      [name] KLOR-CON M20 20 MEQ tablet Take 20 mEq by mouth 2 (two) times daily.       [name] LANTUS SOLOSTAR 100 UNIT/ML SOPN 30 Units daily.      . metFORMIN (GLUCOPHAGE) 500 MG tablet Take 500 mg by mouth daily.      . metoprolol (TOPROL-XL) 50 MG 24 hr tablet Take 50  mg by mouth daily.       [name] SYNTHROID 100 MCG tablet Take 100 mcg by mouth daily.      [name] warfarin (COUMADIN) 5 MG tablet Take 5 mg by mouth daily. Mondays take 1 whole pill. All other days take 1.5 tablets.       No current facility-administered medications for this visit.    Allergies:   No Known Allergies  Social History:  The patient  reports that she quit smoking about 10 years ago. Her smoking use included Cigarettes. She smoked 0.00 packs per day. She has never used smokeless tobacco. She reports that she does not drink alcohol or use illicit drugs.   Family history:   Family History  Problem Relation Age of Onset  . Diabetes Mother   . Hypertension Father   . Hyperlipidemia Father   . Diabetes Sister   . Hypertension Sister   . Hyperlipidemia Sister   . Hypertension Brother   . Stroke Sister   . Hypertension Sister   . Diabetes Sister   . Cancer Daughter   . Heart failure Child     ROS:  Please see the history of present illness.  All other  systems reviewed and negative.   PHYSICAL EXAM: VS:  BP 120/70  Pulse 118  Ht 5\' 1"  (1.549 m) Well nourished, well developed, in no acute distress HEENT: Pupils are equal round react to light accommodation extraocular movements are intact.  Neck: no JVD Cardiac: IRRR without murmurs rubs or gallops. Lungs:  clear to auscultation bilaterally, no wheezing, rhonchi or rales Ext: no lower extremity edema.  2+ radial pulses. Skin: warm and dry Neuro:  Grossly normal  EKG:  Afib, 118bpm   ASSESSMENT AND PLAN:  Problem List Items Addressed This Visit   PAF (paroxysmal atrial fibrillation)     Still in afib with RVR but rate is a little better.  I will increase her cardizem CD to 180 bid.  She will use up her current prescription TID then start the new.  Follow up in three months.  She is not interested at this time in DCCV.  Coumadin checked last week.    Relevant Medications      diltiazem (CARDIZEM CD) 120 MG 24 hr capsule      Other Visit Diagnoses   A-fib    -  Primary    Relevant Medications       diltiazem (CARDIZEM CD) 120 MG 24 hr capsule    Other Relevant Orders       EKG 12-Lead

## 2013-07-12 NOTE — Patient Instructions (Addendum)
Take the cardizem CD 120mg  three times per day until ou run out then start 180mg  twice daily.  Follow up in 3 months with Dr. Rennis Golden

## 2013-07-12 NOTE — Assessment & Plan Note (Signed)
Still in afib with RVR but rate is a little better.  I will increase her cardizem CD to 180 bid.  She will use up her current prescription TID then start the new.  Follow up in three months.  She is not interested at this time in DCCV.  Coumadin checked last week.

## 2013-07-16 ENCOUNTER — Encounter: Payer: Self-pay | Admitting: Internal Medicine

## 2013-07-25 LAB — PROTIME-INR: INR: 2.9 — AB (ref <=1.1)

## 2013-07-26 ENCOUNTER — Ambulatory Visit (INDEPENDENT_AMBULATORY_CARE_PROVIDER_SITE_OTHER): Payer: Medicare Other | Admitting: Pharmacist Clinician (PhC)/ Clinical Pharmacy Specialist

## 2013-07-26 DIAGNOSIS — I480 Paroxysmal atrial fibrillation: Secondary | ICD-10-CM

## 2013-07-26 DIAGNOSIS — I4891 Unspecified atrial fibrillation: Secondary | ICD-10-CM

## 2013-07-26 DIAGNOSIS — Z7901 Long term (current) use of anticoagulants: Secondary | ICD-10-CM

## 2013-08-15 ENCOUNTER — Ambulatory Visit (INDEPENDENT_AMBULATORY_CARE_PROVIDER_SITE_OTHER): Payer: Medicare Other | Admitting: Pharmacist Clinician (PhC)/ Clinical Pharmacy Specialist

## 2013-08-15 ENCOUNTER — Ambulatory Visit: Payer: Medicare Other | Admitting: Pharmacist Clinician (PhC)/ Clinical Pharmacy Specialist

## 2013-08-15 VITALS — BP 122/64 | HR 76

## 2013-08-15 DIAGNOSIS — Z7901 Long term (current) use of anticoagulants: Secondary | ICD-10-CM

## 2013-08-15 DIAGNOSIS — I4891 Unspecified atrial fibrillation: Secondary | ICD-10-CM

## 2013-08-15 DIAGNOSIS — I480 Paroxysmal atrial fibrillation: Secondary | ICD-10-CM

## 2013-09-10 ENCOUNTER — Ambulatory Visit (INDEPENDENT_AMBULATORY_CARE_PROVIDER_SITE_OTHER): Payer: Medicare Other | Admitting: Pharmacist Clinician (PhC)/ Clinical Pharmacy Specialist

## 2013-09-10 VITALS — BP 116/64 | HR 60

## 2013-09-10 DIAGNOSIS — I4891 Unspecified atrial fibrillation: Secondary | ICD-10-CM

## 2013-09-10 DIAGNOSIS — Z7901 Long term (current) use of anticoagulants: Secondary | ICD-10-CM

## 2013-09-10 DIAGNOSIS — I480 Paroxysmal atrial fibrillation: Secondary | ICD-10-CM

## 2013-09-10 LAB — POCT INR: INR: 2.3

## 2013-09-10 MED ORDER — ROSUVASTATIN CALCIUM 10 MG PO TABS: 10.0000 mg | ORAL_TABLET | Freq: Every day | ORAL | Status: DC

## 2013-09-18 ENCOUNTER — Encounter: Payer: Self-pay | Admitting: Cardiovascular Disease

## 2013-10-12 ENCOUNTER — Other Ambulatory Visit: Payer: Self-pay | Admitting: Internal Medicine

## 2013-10-22 ENCOUNTER — Ambulatory Visit (INDEPENDENT_AMBULATORY_CARE_PROVIDER_SITE_OTHER): Payer: Medicare Other | Admitting: Pharmacist Clinician (PhC)/ Clinical Pharmacy Specialist

## 2013-10-22 ENCOUNTER — Encounter: Payer: Self-pay | Admitting: Internal Medicine

## 2013-10-22 ENCOUNTER — Ambulatory Visit (INDEPENDENT_AMBULATORY_CARE_PROVIDER_SITE_OTHER): Payer: Medicare Other | Admitting: Internal Medicine

## 2013-10-22 VITALS — BP 110/60 | HR 69 | Ht 62.0 in | Wt 167.5 lb

## 2013-10-22 DIAGNOSIS — E785 Hyperlipidemia, unspecified: Secondary | ICD-10-CM

## 2013-10-22 DIAGNOSIS — I480 Paroxysmal atrial fibrillation: Secondary | ICD-10-CM

## 2013-10-22 DIAGNOSIS — I10 Essential (primary) hypertension: Secondary | ICD-10-CM

## 2013-10-22 DIAGNOSIS — I4891 Unspecified atrial fibrillation: Secondary | ICD-10-CM

## 2013-10-22 DIAGNOSIS — E119 Type 2 diabetes mellitus without complications: Secondary | ICD-10-CM

## 2013-10-22 DIAGNOSIS — Z7901 Long term (current) use of anticoagulants: Secondary | ICD-10-CM

## 2013-10-22 LAB — POCT INR: INR: 2.7

## 2013-10-22 MED ORDER — ROSUVASTATIN CALCIUM 10 MG PO TABS: 10.0000 mg | ORAL_TABLET | Freq: Every day | ORAL | Status: DC

## 2013-10-22 NOTE — Progress Notes (Signed)
OFFICE NOTE  Chief Complaint:  Routine office visit  Primary Care Physician: Dwan Bolt, MD  HPI:  [name] is a 79 year old overweight African American female with a history of paroxysmal atrial fibrillation on Coumadin, hypothyroidism, dyslipidemia, diabetes mellitus type 2, hypertension.  She had an episode of PAF in 2014, but this went away fairly quickly and she is not been symptomatic since then. This may be related to her not taking her medications as scheduled. Since then she's been taking her metoprolol twice daily and has had no real problems. Her warfarin level remains therapeutic and well controlled. Over the past several months she's had a problem with breakthrough paroxysmal atrial fibrillation.  In fact at this point she seems to be persistent.  She saw 2 other physician assistants in our office to were able to get her rate controlled. Once she was at a normal ventricular rate, she felt fairly good. In fact she cannot really tell difference when she is in A. fib versus when she is in sinus rhythm.  Previously her episode was possibly related to thyroid and balance, however her thyroid levels have been well controlled as has her diabetes.   PMHx:  Past Medical History  Diagnosis Date  . Hypertension   . Diabetes mellitus   . Thyroid disease   . Hyperlipidemia   . Colon polyp   . Poor appetite     Past Surgical History  Procedure Laterality Date  . Cardiovascular stress test  07/15/2006    Normal scan, no ECG changes  . Transthoracic echocardiogram  01/08/2011    EF 60-65%, moderae mitral regurg, LA mild-moderately dilated,    FAMHx:  Family History  Problem Relation Age of Onset  . Diabetes Mother   . Hypertension Father   . Hyperlipidemia Father   . Diabetes Sister   . Hypertension Sister   . Hyperlipidemia Sister   . Hypertension Brother   . Stroke Sister   . Hypertension Sister   . Diabetes Sister   . Cancer Daughter   . Heart failure Child      SOCHx:   reports that she quit smoking about 10 years ago. Her smoking use included Cigarettes. She smoked 0.00 packs per day. She has never used smokeless tobacco. She reports that she does not drink alcohol or use illicit drugs.  ALLERGIES:  No Known Allergies  ROS: A comprehensive review of systems was negative except for: Respiratory: positive for dyspnea on exertion  HOME MEDS: Current Outpatient Prescriptions  Medication Sig Dispense Refill  . diltiazem (CARDIZEM CD) 180 MG 24 hr capsule Take 1 capsule (180 mg total) by mouth 2 (two) times daily.  60 capsule  5  . hydrochlorothiazide (HYDRODIURIL) 50 MG tablet Take 50 mg by mouth daily.      [name] KLOR-CON M20 20 MEQ tablet Take 20 mEq by mouth daily.       [name] LANTUS SOLOSTAR 100 UNIT/ML SOPN 30 Units daily.      . metFORMIN (GLUCOPHAGE) 500 MG tablet Take 1,000 mg by mouth 2 (two) times daily with a meal.       . metoprolol (TOPROL-XL) 50 MG 24 hr tablet Take 50 mg by mouth 2 (two) times daily.       . rosuvastatin (CRESTOR) 10 MG tablet Take 1 tablet (10 mg total) by mouth daily.  28 tablet  0  . SYNTHROID 100 MCG tablet Take 100 mcg by mouth daily.      [name] warfarin (COUMADIN) 5 MG  tablet TAKE 1 & 1/2 TABLET BY MOUTH ONCE DAILY OR AS DIRECTED  50 tablet  5   No current facility-administered medications for this visit.    LABS/IMAGING: Results for orders placed in visit on 09/10/13 (from the past 48 hour(s))  POCT INR     Status: None   Collection Time    10/22/13  9:16 AM      Result Value Range   INR 2.7     No results found.  VITALS: BP 110/60  Pulse 69  Ht 5\' 2"  (1.575 m)  Wt 167 lb 8 oz (75.978 kg)  BMI 30.63 kg/m2  EXAM: General appearance: alert and no distress Neck: no adenopathy, no carotid bruit, no JVD, supple, symmetrical, trachea midline and thyroid not enlarged, symmetric, no tenderness/mass/nodules Lungs: clear to auscultation bilaterally Heart: irregularly irregular rhythm, rate-controlled, S1, S2  normal, no murmur, click, rub or gallop Abdomen: soft, non-tender; bowel sounds normal; no masses,  no organomegaly Extremities: extremities normal, atraumatic, no cyanosis or edema Pulses: 2+ and symmetric Skin: Skin color, texture, turgor normal. No rashes or lesions Neurologic: Grossly normal  EKG: A-fib at 69  ASSESSMENT: 1. Persistent atrial fibrillation - now rate controlled 2. Hypertension - at goal 3. Diabetes type 2 - at goal 4. Hypothyroidism - controlled 5. Dyslipidemia - at goal  PLAN: 1.   [name] now has persistent atrial fibrillation which is rate controlled. Her hypertension is also controlled now with the addition of diltiazem to metoprolol. Her diabetes is at goal and her thyroid has been well regulated. She seems to be doing well and is unaware of her atrial fibrillation therefore there is not a clear indication for antiarrhythmic therapy or cardioversion. She is also declined cardioversion over the past several months. I'll plan to see her back in 6 months and we'll recheck her lipid profile at that time. She has managed to lose a small amount of weight and is active going to the [employer] 4-5 times a week which I've encouraged.   Pixie Casino, MD, [facility] Attending Cardiologist [facility] 10/22/2013, 9:33 AM

## 2013-10-22 NOTE — Patient Instructions (Signed)
Follow up in 6 months 

## 2013-10-31 ENCOUNTER — Other Ambulatory Visit: Payer: Self-pay

## 2013-10-31 DIAGNOSIS — Z1231 Encounter for screening mammogram for malignant neoplasm of breast: Secondary | ICD-10-CM

## 2013-12-05 ENCOUNTER — Ambulatory Visit: Payer: Medicare Other | Admitting: Pharmacist Clinician (PhC)/ Clinical Pharmacy Specialist

## 2013-12-12 ENCOUNTER — Ambulatory Visit: Payer: Medicare Other

## 2013-12-26 ENCOUNTER — Other Ambulatory Visit: Payer: Self-pay

## 2013-12-26 ENCOUNTER — Ambulatory Visit
Admission: RE | Admit: 2013-12-26 | Discharge: 2013-12-26 | Disposition: A | Discharge status: Home / Self Care | Payer: Medicare Other | Source: Ambulatory Visit

## 2013-12-26 DIAGNOSIS — Z1231 Encounter for screening mammogram for malignant neoplasm of breast: Secondary | ICD-10-CM

## 2014-02-20 ENCOUNTER — Telehealth: Payer: Self-pay | Admitting: Internal Medicine

## 2014-02-20 MED ORDER — DILTIAZEM HCL ER COATED BEADS 180 MG PO CP24: 180.0000 mg | ORAL_CAPSULE | Freq: Two times a day (BID) | ORAL | Status: DC

## 2014-02-20 NOTE — Telephone Encounter (Signed)
Need a refill on her Diltiazem 180 mg #30. Please call this in asap to [phone].She is completely out of this medicine.

## 2014-04-01 ENCOUNTER — Ambulatory Visit (INDEPENDENT_AMBULATORY_CARE_PROVIDER_SITE_OTHER): Payer: Medicare Other | Admitting: Pharmacist Clinician (PhC)/ Clinical Pharmacy Specialist

## 2014-04-01 DIAGNOSIS — E785 Hyperlipidemia, unspecified: Secondary | ICD-10-CM

## 2014-04-01 DIAGNOSIS — I4891 Unspecified atrial fibrillation: Secondary | ICD-10-CM

## 2014-04-01 DIAGNOSIS — Z7901 Long term (current) use of anticoagulants: Secondary | ICD-10-CM

## 2014-04-01 DIAGNOSIS — I480 Paroxysmal atrial fibrillation: Secondary | ICD-10-CM

## 2014-04-01 LAB — POCT INR: INR: 1.1

## 2014-04-01 MED ORDER — ROSUVASTATIN CALCIUM 10 MG PO TABS: 10.0000 mg | ORAL_TABLET | Freq: Every day | ORAL | Status: DC

## 2014-04-11 ENCOUNTER — Ambulatory Visit (INDEPENDENT_AMBULATORY_CARE_PROVIDER_SITE_OTHER): Payer: Medicare Other | Admitting: *Deleted

## 2014-04-11 ENCOUNTER — Encounter: Payer: Self-pay | Admitting: Internal Medicine

## 2014-04-11 ENCOUNTER — Ambulatory Visit (INDEPENDENT_AMBULATORY_CARE_PROVIDER_SITE_OTHER): Payer: Medicare Other | Admitting: Internal Medicine

## 2014-04-11 ENCOUNTER — Ambulatory Visit: Payer: Medicare Other | Admitting: Pharmacist Clinician (PhC)/ Clinical Pharmacy Specialist

## 2014-04-11 VITALS — BP 142/80 | HR 54 | Ht 62.0 in | Wt 176.8 lb

## 2014-04-11 DIAGNOSIS — I4891 Unspecified atrial fibrillation: Secondary | ICD-10-CM

## 2014-04-11 DIAGNOSIS — E785 Hyperlipidemia, unspecified: Secondary | ICD-10-CM

## 2014-04-11 DIAGNOSIS — I480 Paroxysmal atrial fibrillation: Secondary | ICD-10-CM

## 2014-04-11 DIAGNOSIS — Z7901 Long term (current) use of anticoagulants: Secondary | ICD-10-CM

## 2014-04-11 DIAGNOSIS — I10 Essential (primary) hypertension: Secondary | ICD-10-CM

## 2014-04-11 LAB — POCT INR: INR: 1.8

## 2014-04-11 MED ORDER — HYDROCHLOROTHIAZIDE 50 MG PO TABS: 50.0000 mg | ORAL_TABLET | Freq: Every day | ORAL | Status: DC

## 2014-04-11 MED ORDER — DILTIAZEM HCL ER COATED BEADS 180 MG PO CP24: 180.0000 mg | ORAL_CAPSULE | Freq: Two times a day (BID) | ORAL | Status: DC

## 2014-04-11 MED ORDER — FUROSEMIDE 20 MG PO TABS: 20.0000 mg | ORAL_TABLET | ORAL | Status: DC | PRN

## 2014-04-11 MED ORDER — POTASSIUM CHLORIDE CRYS ER 20 MEQ PO TBCR: 20.0000 meq | EXTENDED_RELEASE_TABLET | Freq: Every day | ORAL | Status: DC

## 2014-04-11 MED ORDER — ATORVASTATIN CALCIUM 20 MG PO TABS: 20.0000 mg | ORAL_TABLET | Freq: Every day | ORAL | Status: DC

## 2014-04-11 MED ORDER — WARFARIN SODIUM 5 MG PO TABS: ORAL_TABLET | ORAL | Status: DC

## 2014-04-11 NOTE — Progress Notes (Signed)
OFFICE NOTE  Chief Complaint:  Routine office visit  Primary Care Physician: Dwan Bolt, MD  HPI:  [name] is a 79 year old overweight African American female with a history of paroxysmal atrial fibrillation on Coumadin, hypothyroidism, dyslipidemia, diabetes mellitus type 2, hypertension.  She had an episode of PAF in 2014, but this went away fairly quickly and she is not been symptomatic since then. This may be related to her not taking her medications as scheduled. Since then she's been taking her metoprolol twice daily and has had no real problems. Her warfarin level remains therapeutic and well controlled. Over the past several months she's had a problem with breakthrough paroxysmal atrial fibrillation.  In fact at this point she seems to be persistent.  She saw 2 other physician assistants in our office to were able to get her rate controlled. Once she was at a normal ventricular rate, she felt fairly good. In fact she cannot really tell difference when she is in A. fib versus when she is in sinus rhythm.  Previously her episode was possibly related to thyroid and balance, however her thyroid levels have been well controlled as has her diabetes.   [name] returns today and has no specific complaints. She's not having any adverse bleeding issues. Interestingly, her EKG shows sinus rhythm today, therefore her A. fib is more likely paroxysmal.  She reports that she really does not feel any different in A. fib and she does in a sinus rhythm.  PMHx:  Past Medical History  Diagnosis Date  . Hypertension   . Diabetes mellitus   . Thyroid disease   . Hyperlipidemia   . Colon polyp   . Poor appetite     Past Surgical History  Procedure Laterality Date  . Cardiovascular stress test  07/15/2006    Normal scan, no ECG changes  . Transthoracic echocardiogram  01/08/2011    EF 60-65%, moderae mitral regurg, LA mild-moderately dilated,    FAMHx:  Family History  Problem  Relation Age of Onset  . Diabetes Mother   . Hypertension Father   . Hyperlipidemia Father   . Diabetes Sister   . Hypertension Sister   . Hyperlipidemia Sister   . Hypertension Brother   . Stroke Sister   . Hypertension Sister   . Diabetes Sister   . Cancer Daughter   . Heart failure Child     SOCHx:   reports that she quit smoking about 10 years ago. Her smoking use included Cigarettes. She smoked 0.00 packs per day. She has never used smokeless tobacco. She reports that she does not drink alcohol or use illicit drugs.  ALLERGIES:  No Known Allergies  ROS: A comprehensive review of systems was negative.  HOME MEDS: Current Outpatient Prescriptions  Medication Sig Dispense Refill  . diltiazem (CARDIZEM CD) 180 MG 24 hr capsule Take 1 capsule (180 mg total) by mouth 2 (two) times daily.  60 capsule  6  . furosemide (LASIX) 20 MG tablet Take 1 tablet (20 mg total) by mouth as needed for edema.  30 tablet  6  . hydrochlorothiazide (HYDRODIURIL) 50 MG tablet Take 1 tablet (50 mg total) by mouth daily.  30 tablet  6  . LANTUS SOLOSTAR 100 UNIT/ML SOPN 30 Units daily.      . metFORMIN (GLUCOPHAGE) 500 MG tablet Take 1,000 mg by mouth 2 (two) times daily with a meal.       . metoprolol (TOPROL-XL) 50 MG 24 hr tablet Take 50  mg by mouth 2 (two) times daily.       . potassium chloride SA (KLOR-CON M20) 20 MEQ tablet Take 1 tablet (20 mEq total) by mouth daily.  30 tablet  6  . SYNTHROID 100 MCG tablet Take 100 mcg by mouth daily.      [name] warfarin (COUMADIN) 5 MG tablet Take as directed by mouth day per INR  50 tablet  6  . atorvastatin (LIPITOR) 20 MG tablet Take 1 tablet (20 mg total) by mouth daily.  30 tablet  6   No current facility-administered medications for this visit.    LABS/IMAGING: No results found for this or any previous visit (from the past 48 hour(s)). No results found.  VITALS: BP 142/80  Pulse 54  Ht 5\' 2"  (1.575 m)  Wt 176 lb 12.8 oz (80.196 kg)  BMI 32.33  kg/m2  EXAM: General appearance: alert and no distress Neck: no adenopathy, no carotid bruit, no JVD, supple, symmetrical, trachea midline and thyroid not enlarged, symmetric, no tenderness/mass/nodules Lungs: clear to auscultation bilaterally Heart: irregularly irregular rhythm, rate-controlled, S1, S2 normal, no murmur, click, rub or gallop Abdomen: soft, non-tender; bowel sounds normal; no masses,  no organomegaly Extremities: extremities normal, atraumatic, no cyanosis or edema Pulses: 2+ and symmetric Skin: Skin color, texture, turgor normal. No rashes or lesions Neurologic: Grossly normal  EKG: Sinus bradycardia at 54  ASSESSMENT: 1. Paroxysmal atrial fibrillation 2. Hypertension - at goal 3. Diabetes type 2 - at goal 4. Hypothyroidism - controlled 5. Dyslipidemia - at goal  PLAN: 1.   [name] was thought to be more persistent A. fib however she is in sinus rhythm today. She does not feel any different. Her hypertension is controlled. Her diabetes is at goal. Her cholesterol is at goal. Overall she is doing very well. I've encouraged her to continue to be active and she is enjoying her flower garden this summer. Plan to see her back in 6 months or sooner as necessary.  Pixie Casino, MD, [facility] Attending Cardiologist [facility] 04/11/2014, 2:10 PM

## 2014-04-11 NOTE — Patient Instructions (Signed)
Take 1.5 tablets on Saturday (7.5mg )  All other days remain same.   Repeat INR in 2 weeks (when Gay Filler is in office)

## 2014-04-11 NOTE — Patient Instructions (Signed)
Your physician wants you to follow-up in: 6 months with Dr. Debara Pickett. You will receive a reminder letter in the mail two months in advance. If you don't receive a letter, please call our office to schedule the follow-up appointment.  Your physician has recommended you make the following change in your medication: STOP crestor. START atorvastatin 20mg  once daily.

## 2014-04-29 ENCOUNTER — Ambulatory Visit (INDEPENDENT_AMBULATORY_CARE_PROVIDER_SITE_OTHER): Payer: Medicare Other | Admitting: Pharmacist Clinician (PhC)/ Clinical Pharmacy Specialist

## 2014-04-29 DIAGNOSIS — I4891 Unspecified atrial fibrillation: Secondary | ICD-10-CM

## 2014-04-29 DIAGNOSIS — I480 Paroxysmal atrial fibrillation: Secondary | ICD-10-CM

## 2014-04-29 DIAGNOSIS — Z7901 Long term (current) use of anticoagulants: Secondary | ICD-10-CM

## 2014-04-29 LAB — POCT INR: INR: 1.1

## 2014-05-15 ENCOUNTER — Ambulatory Visit: Payer: Medicare Other | Admitting: Pharmacist Clinician (PhC)/ Clinical Pharmacy Specialist

## 2014-05-22 ENCOUNTER — Ambulatory Visit (INDEPENDENT_AMBULATORY_CARE_PROVIDER_SITE_OTHER): Payer: Medicare Other | Admitting: Pharmacist Clinician (PhC)/ Clinical Pharmacy Specialist

## 2014-05-22 DIAGNOSIS — I480 Paroxysmal atrial fibrillation: Secondary | ICD-10-CM

## 2014-05-22 DIAGNOSIS — Z7901 Long term (current) use of anticoagulants: Secondary | ICD-10-CM

## 2014-05-22 DIAGNOSIS — I4891 Unspecified atrial fibrillation: Secondary | ICD-10-CM

## 2014-05-22 LAB — POCT INR: INR: 1.3

## 2014-06-05 ENCOUNTER — Ambulatory Visit (INDEPENDENT_AMBULATORY_CARE_PROVIDER_SITE_OTHER): Payer: Medicare Other | Admitting: Pharmacist Clinician (PhC)/ Clinical Pharmacy Specialist

## 2014-06-05 DIAGNOSIS — I4891 Unspecified atrial fibrillation: Secondary | ICD-10-CM

## 2014-06-05 DIAGNOSIS — Z7901 Long term (current) use of anticoagulants: Secondary | ICD-10-CM

## 2014-06-05 DIAGNOSIS — I480 Paroxysmal atrial fibrillation: Secondary | ICD-10-CM

## 2014-06-05 LAB — POCT INR: INR: 1.3

## 2014-06-19 ENCOUNTER — Ambulatory Visit (INDEPENDENT_AMBULATORY_CARE_PROVIDER_SITE_OTHER): Payer: Medicare Other | Admitting: Pharmacist Clinician (PhC)/ Clinical Pharmacy Specialist

## 2014-06-19 DIAGNOSIS — I4891 Unspecified atrial fibrillation: Secondary | ICD-10-CM

## 2014-06-19 DIAGNOSIS — I480 Paroxysmal atrial fibrillation: Secondary | ICD-10-CM

## 2014-06-19 DIAGNOSIS — Z7901 Long term (current) use of anticoagulants: Secondary | ICD-10-CM

## 2014-06-19 LAB — POCT INR: INR: 2.7

## 2014-07-17 ENCOUNTER — Ambulatory Visit (INDEPENDENT_AMBULATORY_CARE_PROVIDER_SITE_OTHER): Payer: Medicare Other | Admitting: Pharmacist Clinician (PhC)/ Clinical Pharmacy Specialist

## 2014-07-17 DIAGNOSIS — Z7901 Long term (current) use of anticoagulants: Secondary | ICD-10-CM

## 2014-07-17 DIAGNOSIS — I480 Paroxysmal atrial fibrillation: Secondary | ICD-10-CM

## 2014-07-17 LAB — POCT INR: INR: 2.6

## 2014-08-14 ENCOUNTER — Ambulatory Visit (INDEPENDENT_AMBULATORY_CARE_PROVIDER_SITE_OTHER): Payer: Medicare Other | Admitting: Pharmacist Clinician (PhC)/ Clinical Pharmacy Specialist

## 2014-08-14 DIAGNOSIS — Z7901 Long term (current) use of anticoagulants: Secondary | ICD-10-CM

## 2014-08-14 DIAGNOSIS — I480 Paroxysmal atrial fibrillation: Secondary | ICD-10-CM

## 2014-08-14 LAB — POCT INR: INR: 1.8

## 2014-09-09 ENCOUNTER — Other Ambulatory Visit: Payer: Self-pay | Admitting: Internal Medicine

## 2014-09-09 NOTE — Telephone Encounter (Signed)
Rx has been sent to the pharmacy electronically. ° °

## 2014-09-11 ENCOUNTER — Ambulatory Visit (INDEPENDENT_AMBULATORY_CARE_PROVIDER_SITE_OTHER): Payer: Medicare Other | Admitting: Pharmacist Clinician (PhC)/ Clinical Pharmacy Specialist

## 2014-09-11 DIAGNOSIS — I480 Paroxysmal atrial fibrillation: Secondary | ICD-10-CM

## 2014-09-11 DIAGNOSIS — Z7901 Long term (current) use of anticoagulants: Secondary | ICD-10-CM

## 2014-09-11 LAB — POCT INR: INR: 2

## 2014-10-16 ENCOUNTER — Ambulatory Visit: Payer: Medicare Other | Admitting: Pharmacist Clinician (PhC)/ Clinical Pharmacy Specialist

## 2014-12-03 ENCOUNTER — Other Ambulatory Visit: Payer: Self-pay | Admitting: Internal Medicine

## 2014-12-03 NOTE — Telephone Encounter (Signed)
Rx has been sent to the pharmacy electronically. ° °

## 2014-12-16 ENCOUNTER — Ambulatory Visit (INDEPENDENT_AMBULATORY_CARE_PROVIDER_SITE_OTHER): Payer: Medicare Other | Admitting: Pharmacist Clinician (PhC)/ Clinical Pharmacy Specialist

## 2014-12-16 DIAGNOSIS — I480 Paroxysmal atrial fibrillation: Secondary | ICD-10-CM

## 2014-12-16 DIAGNOSIS — Z7901 Long term (current) use of anticoagulants: Secondary | ICD-10-CM

## 2014-12-16 LAB — POCT INR: INR: 1.1

## 2014-12-18 ENCOUNTER — Other Ambulatory Visit: Payer: Self-pay | Admitting: Internal Medicine

## 2014-12-18 NOTE — Telephone Encounter (Signed)
Rx(s) sent to pharmacy electronically.  

## 2014-12-30 ENCOUNTER — Ambulatory Visit (INDEPENDENT_AMBULATORY_CARE_PROVIDER_SITE_OTHER): Payer: Medicare Other | Admitting: Pharmacist Clinician (PhC)/ Clinical Pharmacy Specialist

## 2014-12-30 DIAGNOSIS — I480 Paroxysmal atrial fibrillation: Secondary | ICD-10-CM

## 2014-12-30 DIAGNOSIS — Z7901 Long term (current) use of anticoagulants: Secondary | ICD-10-CM

## 2014-12-30 LAB — POCT INR: INR: 3.1

## 2014-12-31 ENCOUNTER — Other Ambulatory Visit: Payer: Self-pay

## 2014-12-31 DIAGNOSIS — Z1231 Encounter for screening mammogram for malignant neoplasm of breast: Secondary | ICD-10-CM

## 2015-01-02 ENCOUNTER — Ambulatory Visit
Admission: RE | Admit: 2015-01-02 | Discharge: 2015-01-02 | Disposition: A | Discharge status: Home / Self Care | Payer: Medicare Other | Source: Ambulatory Visit

## 2015-01-02 DIAGNOSIS — Z1231 Encounter for screening mammogram for malignant neoplasm of breast: Secondary | ICD-10-CM

## 2015-02-03 ENCOUNTER — Encounter: Payer: Self-pay | Admitting: Internal Medicine

## 2015-02-03 ENCOUNTER — Ambulatory Visit (INDEPENDENT_AMBULATORY_CARE_PROVIDER_SITE_OTHER): Payer: Medicare Other | Admitting: Pharmacist Clinician (PhC)/ Clinical Pharmacy Specialist

## 2015-02-03 ENCOUNTER — Ambulatory Visit (INDEPENDENT_AMBULATORY_CARE_PROVIDER_SITE_OTHER): Payer: Medicare Other | Admitting: Internal Medicine

## 2015-02-03 VITALS — BP 132/64 | HR 47 | Ht 62.0 in | Wt 175.3 lb

## 2015-02-03 DIAGNOSIS — Z7901 Long term (current) use of anticoagulants: Secondary | ICD-10-CM

## 2015-02-03 DIAGNOSIS — E119 Type 2 diabetes mellitus without complications: Secondary | ICD-10-CM | POA: Diagnosis not present

## 2015-02-03 DIAGNOSIS — I480 Paroxysmal atrial fibrillation: Secondary | ICD-10-CM

## 2015-02-03 DIAGNOSIS — E038 Other specified hypothyroidism: Secondary | ICD-10-CM

## 2015-02-03 DIAGNOSIS — I10 Essential (primary) hypertension: Secondary | ICD-10-CM

## 2015-02-03 DIAGNOSIS — E039 Hypothyroidism, unspecified: Secondary | ICD-10-CM | POA: Insufficient documentation

## 2015-02-03 LAB — POCT INR: INR: 1.7

## 2015-02-03 MED ORDER — FUROSEMIDE 20 MG PO TABS: 20.0000 mg | ORAL_TABLET | ORAL | Status: DC | PRN

## 2015-02-03 MED ORDER — HYDROCHLOROTHIAZIDE 50 MG PO TABS
50.0000 mg | ORAL_TABLET | Freq: Every day | ORAL | Status: DC
Start: 2015-02-03 — End: 2016-03-22

## 2015-02-03 MED ORDER — DILTIAZEM HCL ER COATED BEADS 120 MG PO CP24: 120.0000 mg | ORAL_CAPSULE | Freq: Two times a day (BID) | ORAL | Status: DC

## 2015-02-03 MED ORDER — DILTIAZEM HCL ER COATED BEADS 120 MG PO CP24: 120.0000 mg | ORAL_CAPSULE | Freq: Every day | ORAL | Status: DC

## 2015-02-03 MED ORDER — METOPROLOL SUCCINATE ER 50 MG PO TB24: 50.0000 mg | ORAL_TABLET | Freq: Two times a day (BID) | ORAL | Status: DC

## 2015-02-03 NOTE — Patient Instructions (Signed)
Your physician has recommended you make the following change in your medication: DECREASE diltiazem to 120mg  twice daily  Your physician wants you to follow-up in: 6 months with Dr. Debara Pickett. You will receive a reminder letter in the mail two months in advance. If you don't receive a letter, please call our office to schedule the follow-up appointment.

## 2015-02-03 NOTE — Progress Notes (Signed)
OFFICE NOTE  Chief Complaint:  Routine office visit  Primary Care Physician: Dwan Bolt, MD  HPI:  [name] is a 80 year old overweight African American female with a history of paroxysmal atrial fibrillation on Coumadin, hypothyroidism, dyslipidemia, diabetes mellitus type 2, hypertension.  She had an episode of PAF in 2014, but this went away fairly quickly and she is not been symptomatic since then. This may be related to her not taking her medications as scheduled. Since then she's been taking her metoprolol twice daily and has had no real problems. Her warfarin level remains therapeutic and well controlled. Over the past several months she's had a problem with breakthrough paroxysmal atrial fibrillation.  In fact at this point she seems to be persistent.  She saw 2 other physician assistants in our office to were able to get her rate controlled. Once she was at a normal ventricular rate, she felt fairly good. In fact she cannot really tell difference when she is in A. fib versus when she is in sinus rhythm.  Previously her episode was possibly related to thyroid and balance, however her thyroid levels have been well controlled as has her diabetes.   [name] returns today for follow-up. Her EKG shows sinus rhythm. It is notable that she is bradycardic today with heart rate in the upper 40s. INR was checked in the office today and is low at 1.7. This will be adjusted by Erasmo Downer, our anticoagulation pharmacist. She denies any complaints such as shortness of breath, increasing fatigue, presyncope or syncopal symptoms. She also denies any chest pain or palpitations and is unaware of any recurrent atrial fibrillation. She's recently been started on an injectable insulin in addition to her oral Glucophage for diabetes. Her cholesterol is also managed by her primary care provider.  PMHx:  Past Medical History  Diagnosis Date  . Hypertension   . Diabetes mellitus   . Thyroid  disease   . Hyperlipidemia   . Colon polyp   . Poor appetite     Past Surgical History  Procedure Laterality Date  . Cardiovascular stress test  07/15/2006    Normal scan, no ECG changes  . Transthoracic echocardiogram  01/08/2011    EF 60-65%, moderae mitral regurg, LA mild-moderately dilated,    FAMHx:  Family History  Problem Relation Age of Onset  . Diabetes Mother   . Hypertension Father   . Hyperlipidemia Father   . Diabetes Sister   . Hypertension Sister   . Hyperlipidemia Sister   . Hypertension Brother   . Stroke Sister   . Hypertension Sister   . Diabetes Sister   . Cancer Daughter   . Heart failure Child     SOCHx:   reports that she quit smoking about 11 years ago. Her smoking use included Cigarettes. She has never used smokeless tobacco. She reports that she does not drink alcohol or use illicit drugs.  ALLERGIES:  No Known Allergies  ROS: A comprehensive review of systems was negative.  HOME MEDS: Current Outpatient Prescriptions  Medication Sig Dispense Refill  . atorvastatin (LIPITOR) 20 MG tablet TAKE 1 TABLET (20 MG TOTAL) BY MOUTH DAILY. 30 tablet 4  . furosemide (LASIX) 20 MG tablet Take 1 tablet (20 mg total) by mouth as needed for edema. 90 tablet 1  . hydrochlorothiazide (HYDRODIURIL) 50 MG tablet Take 1 tablet (50 mg total) by mouth daily. 90 tablet 3  . levothyroxine (SYNTHROID, LEVOTHROID) 100 MCG tablet Take 100 mcg by mouth daily before  breakfast.    . metFORMIN (GLUCOPHAGE) 500 MG tablet Take 1,000 mg by mouth 2 (two) times daily with a meal.     . metoprolol succinate (TOPROL-XL) 50 MG 24 hr tablet Take 1 tablet (50 mg total) by mouth 2 (two) times daily. 180 tablet 3  . potassium chloride SA (KLOR-CON M20) 20 MEQ tablet Take 1 tablet (20 mEq total) by mouth daily. 30 tablet 6  . warfarin (COUMADIN) 5 MG tablet Take as directed by mouth day per INR 50 tablet 6  . diltiazem (CARDIZEM CD) 120 MG 24 hr capsule Take 1 capsule (120 mg total)  by mouth 2 (two) times daily. 180 capsule 3   No current facility-administered medications for this visit.    LABS/IMAGING: Results for orders placed or performed in visit on 02/03/15 (from the past 48 hour(s))  POCT INR     Status: None   Collection Time: 02/03/15  8:32 AM  Result Value Ref Range   INR 1.7    No results found.  VITALS: BP 132/64 mmHg  Pulse 47  Ht 5\' 2"  (1.575 m)  Wt 175 lb 4.8 oz (79.516 kg)  BMI 32.05 kg/m2  EXAM: General appearance: alert and no distress Neck: no adenopathy, no carotid bruit, no JVD, supple, symmetrical, trachea midline and thyroid not enlarged, symmetric, no tenderness/mass/nodules Lungs: clear to auscultation bilaterally Heart: irregularly irregular rhythm, rate-controlled, S1, S2 normal, no murmur, click, rub or gallop Abdomen: soft, non-tender; bowel sounds normal; no masses,  no organomegaly Extremities: extremities normal, atraumatic, no cyanosis or edema Pulses: 2+ and symmetric Skin: Skin color, texture, turgor normal. No rashes or lesions Neurologic: Grossly normal  EKG: Sinus bradycardia at 47  ASSESSMENT: 1. Paroxysmal atrial fibrillation 2. Hypertension - at goal 3. Diabetes type 2 - at goal 4. Hypothyroidism - controlled 5. Dyslipidemia - at goal  PLAN: 1.   [name] has had a couple of episodes of breakthrough A. fib, but is been more than one year. She's had increases in her diltiazem including doubling the long-acting dose by our physician assistants. Currently her heart rate is quite low, and although she is asymptomatic, I am not comfortable with her heart rate in the 40s. I would recommend decreasing her diltiazem back to 120 mg CD twice daily. She should remain on Toprol-XL 50 mg twice daily. We will go ahead and make adjustments to her warfarin regimen. She prefers to remain on this medication. As mentioned, diabetes and cholesterol are followed by her primary care provider. Plan to see her back in 6 months or  sooner as necessary.  Pixie Casino, MD, [facility] Attending Cardiologist [facility] 02/03/2015, 9:33 AM

## 2015-03-01 ENCOUNTER — Other Ambulatory Visit: Payer: Self-pay | Admitting: Internal Medicine

## 2015-03-05 ENCOUNTER — Ambulatory Visit (INDEPENDENT_AMBULATORY_CARE_PROVIDER_SITE_OTHER): Payer: Medicare Other | Admitting: Pharmacist Clinician (PhC)/ Clinical Pharmacy Specialist

## 2015-03-05 DIAGNOSIS — Z7901 Long term (current) use of anticoagulants: Secondary | ICD-10-CM

## 2015-03-05 DIAGNOSIS — I480 Paroxysmal atrial fibrillation: Secondary | ICD-10-CM

## 2015-03-05 LAB — POCT INR: INR: 1.7

## 2015-03-26 ENCOUNTER — Ambulatory Visit: Payer: Medicare Other | Admitting: Pharmacist Clinician (PhC)/ Clinical Pharmacy Specialist

## 2015-03-26 ENCOUNTER — Ambulatory Visit (INDEPENDENT_AMBULATORY_CARE_PROVIDER_SITE_OTHER): Payer: Medicare Other | Admitting: Pharmacist Clinician (PhC)/ Clinical Pharmacy Specialist

## 2015-03-26 DIAGNOSIS — Z7901 Long term (current) use of anticoagulants: Secondary | ICD-10-CM

## 2015-03-26 DIAGNOSIS — I480 Paroxysmal atrial fibrillation: Secondary | ICD-10-CM | POA: Diagnosis not present

## 2015-03-26 LAB — POCT INR: INR: 4.8

## 2015-04-04 ENCOUNTER — Ambulatory Visit (INDEPENDENT_AMBULATORY_CARE_PROVIDER_SITE_OTHER): Payer: Medicare Other | Admitting: Pharmacist

## 2015-04-04 DIAGNOSIS — I480 Paroxysmal atrial fibrillation: Secondary | ICD-10-CM | POA: Diagnosis not present

## 2015-04-04 DIAGNOSIS — Z7901 Long term (current) use of anticoagulants: Secondary | ICD-10-CM | POA: Diagnosis not present

## 2015-04-04 LAB — POCT INR: INR: 2.5

## 2015-04-23 ENCOUNTER — Ambulatory Visit (INDEPENDENT_AMBULATORY_CARE_PROVIDER_SITE_OTHER): Payer: Medicare Other | Admitting: Pharmacist Clinician (PhC)/ Clinical Pharmacy Specialist

## 2015-04-23 DIAGNOSIS — Z7901 Long term (current) use of anticoagulants: Secondary | ICD-10-CM | POA: Diagnosis not present

## 2015-04-23 DIAGNOSIS — I480 Paroxysmal atrial fibrillation: Secondary | ICD-10-CM

## 2015-04-23 LAB — POCT INR: INR: 5.1

## 2015-05-07 ENCOUNTER — Ambulatory Visit (INDEPENDENT_AMBULATORY_CARE_PROVIDER_SITE_OTHER): Payer: Medicare Other | Admitting: Pharmacist Clinician (PhC)/ Clinical Pharmacy Specialist

## 2015-05-07 DIAGNOSIS — I480 Paroxysmal atrial fibrillation: Secondary | ICD-10-CM | POA: Diagnosis not present

## 2015-05-07 DIAGNOSIS — Z7901 Long term (current) use of anticoagulants: Secondary | ICD-10-CM

## 2015-05-07 LAB — POCT INR: INR: 3.4

## 2015-05-21 ENCOUNTER — Ambulatory Visit (INDEPENDENT_AMBULATORY_CARE_PROVIDER_SITE_OTHER): Payer: Medicare Other | Admitting: Pharmacist Clinician (PhC)/ Clinical Pharmacy Specialist

## 2015-05-21 DIAGNOSIS — Z7901 Long term (current) use of anticoagulants: Secondary | ICD-10-CM | POA: Diagnosis not present

## 2015-05-21 DIAGNOSIS — I480 Paroxysmal atrial fibrillation: Secondary | ICD-10-CM | POA: Diagnosis not present

## 2015-05-21 LAB — POCT INR: INR: 2.4

## 2015-05-29 ENCOUNTER — Other Ambulatory Visit: Payer: Self-pay | Admitting: Internal Medicine

## 2015-05-29 NOTE — Telephone Encounter (Signed)
Rx(s) sent to pharmacy electronically.  

## 2015-06-11 ENCOUNTER — Ambulatory Visit (INDEPENDENT_AMBULATORY_CARE_PROVIDER_SITE_OTHER): Payer: Medicare Other | Admitting: Pharmacist Clinician (PhC)/ Clinical Pharmacy Specialist

## 2015-06-11 DIAGNOSIS — I480 Paroxysmal atrial fibrillation: Secondary | ICD-10-CM

## 2015-06-11 DIAGNOSIS — Z7901 Long term (current) use of anticoagulants: Secondary | ICD-10-CM

## 2015-06-11 LAB — POCT INR: INR: 3.8

## 2015-06-25 ENCOUNTER — Ambulatory Visit (INDEPENDENT_AMBULATORY_CARE_PROVIDER_SITE_OTHER): Payer: Medicare Other | Admitting: Pharmacist Clinician (PhC)/ Clinical Pharmacy Specialist

## 2015-06-25 DIAGNOSIS — Z7901 Long term (current) use of anticoagulants: Secondary | ICD-10-CM

## 2015-06-25 DIAGNOSIS — I480 Paroxysmal atrial fibrillation: Secondary | ICD-10-CM

## 2015-06-25 LAB — POCT INR: INR: 1.4

## 2015-07-09 ENCOUNTER — Ambulatory Visit (INDEPENDENT_AMBULATORY_CARE_PROVIDER_SITE_OTHER): Payer: Medicare Other | Admitting: Pharmacist Clinician (PhC)/ Clinical Pharmacy Specialist

## 2015-07-09 DIAGNOSIS — I480 Paroxysmal atrial fibrillation: Secondary | ICD-10-CM | POA: Diagnosis not present

## 2015-07-09 DIAGNOSIS — Z7901 Long term (current) use of anticoagulants: Secondary | ICD-10-CM | POA: Diagnosis not present

## 2015-07-09 LAB — POCT INR: INR: 1.2

## 2015-07-23 ENCOUNTER — Ambulatory Visit: Payer: Medicare Other | Admitting: Pharmacist Clinician (PhC)/ Clinical Pharmacy Specialist

## 2015-07-24 ENCOUNTER — Ambulatory Visit (INDEPENDENT_AMBULATORY_CARE_PROVIDER_SITE_OTHER): Payer: Medicare Other | Admitting: Pharmacist

## 2015-07-24 DIAGNOSIS — Z7901 Long term (current) use of anticoagulants: Secondary | ICD-10-CM

## 2015-07-24 DIAGNOSIS — I480 Paroxysmal atrial fibrillation: Secondary | ICD-10-CM

## 2015-07-24 LAB — POCT INR: INR: 1.4

## 2015-08-06 ENCOUNTER — Ambulatory Visit (INDEPENDENT_AMBULATORY_CARE_PROVIDER_SITE_OTHER): Payer: Medicare Other | Admitting: Pharmacist Clinician (PhC)/ Clinical Pharmacy Specialist

## 2015-08-06 DIAGNOSIS — Z7901 Long term (current) use of anticoagulants: Secondary | ICD-10-CM | POA: Diagnosis not present

## 2015-08-06 DIAGNOSIS — I480 Paroxysmal atrial fibrillation: Secondary | ICD-10-CM | POA: Diagnosis not present

## 2015-08-06 LAB — POCT INR: INR: 3

## 2015-08-22 ENCOUNTER — Ambulatory Visit (INDEPENDENT_AMBULATORY_CARE_PROVIDER_SITE_OTHER): Payer: Medicare Other | Admitting: Pharmacist Clinician (PhC)/ Clinical Pharmacy Specialist

## 2015-08-22 DIAGNOSIS — Z7901 Long term (current) use of anticoagulants: Secondary | ICD-10-CM | POA: Diagnosis not present

## 2015-08-22 DIAGNOSIS — I480 Paroxysmal atrial fibrillation: Secondary | ICD-10-CM | POA: Diagnosis not present

## 2015-08-22 LAB — POCT INR: INR: 4.3

## 2015-09-03 ENCOUNTER — Telehealth: Payer: Self-pay | Admitting: Internal Medicine

## 2015-09-03 ENCOUNTER — Ambulatory Visit (INDEPENDENT_AMBULATORY_CARE_PROVIDER_SITE_OTHER): Payer: Medicare Other | Admitting: Pharmacist Clinician (PhC)/ Clinical Pharmacy Specialist

## 2015-09-03 DIAGNOSIS — I480 Paroxysmal atrial fibrillation: Secondary | ICD-10-CM

## 2015-09-03 DIAGNOSIS — Z7901 Long term (current) use of anticoagulants: Secondary | ICD-10-CM

## 2015-09-03 LAB — POCT INR: INR: 7.1

## 2015-09-03 LAB — PROTIME-INR
INR: 4.98 — AB (ref <1.50)
Prothrombin Time: 47.1 seconds — ABNORMAL HIGH (ref 11.6–15.2)

## 2015-09-03 NOTE — Telephone Encounter (Signed)
[name] has some STAT labs for this pt  Thanks

## 2015-09-03 NOTE — Telephone Encounter (Signed)
LABS- AVAILABE IN [facility] TO [facility]

## 2015-09-03 NOTE — Telephone Encounter (Signed)
See anticoag note

## 2015-09-15 ENCOUNTER — Ambulatory Visit (INDEPENDENT_AMBULATORY_CARE_PROVIDER_SITE_OTHER): Payer: Medicare Other | Admitting: Pharmacist Clinician (PhC)/ Clinical Pharmacy Specialist

## 2015-09-15 DIAGNOSIS — I480 Paroxysmal atrial fibrillation: Secondary | ICD-10-CM | POA: Diagnosis not present

## 2015-09-15 DIAGNOSIS — Z7901 Long term (current) use of anticoagulants: Secondary | ICD-10-CM | POA: Diagnosis not present

## 2015-09-15 LAB — POCT INR: INR: 3.2

## 2015-10-01 ENCOUNTER — Ambulatory Visit (INDEPENDENT_AMBULATORY_CARE_PROVIDER_SITE_OTHER): Payer: Medicare Other | Admitting: Pharmacist Clinician (PhC)/ Clinical Pharmacy Specialist

## 2015-10-01 DIAGNOSIS — Z7901 Long term (current) use of anticoagulants: Secondary | ICD-10-CM | POA: Diagnosis not present

## 2015-10-01 DIAGNOSIS — I480 Paroxysmal atrial fibrillation: Secondary | ICD-10-CM | POA: Diagnosis not present

## 2015-10-01 LAB — POCT INR: INR: 1.2

## 2015-10-10 ENCOUNTER — Ambulatory Visit (INDEPENDENT_AMBULATORY_CARE_PROVIDER_SITE_OTHER): Payer: Medicare Other | Admitting: Pharmacist Clinician (PhC)/ Clinical Pharmacy Specialist

## 2015-10-10 DIAGNOSIS — Z7901 Long term (current) use of anticoagulants: Secondary | ICD-10-CM

## 2015-10-10 DIAGNOSIS — I480 Paroxysmal atrial fibrillation: Secondary | ICD-10-CM

## 2015-10-10 LAB — POCT INR: INR: 2.4

## 2015-10-29 ENCOUNTER — Ambulatory Visit (INDEPENDENT_AMBULATORY_CARE_PROVIDER_SITE_OTHER): Payer: Medicare Other | Admitting: Pharmacist Clinician (PhC)/ Clinical Pharmacy Specialist

## 2015-10-29 DIAGNOSIS — Z7901 Long term (current) use of anticoagulants: Secondary | ICD-10-CM | POA: Diagnosis not present

## 2015-10-29 DIAGNOSIS — I480 Paroxysmal atrial fibrillation: Secondary | ICD-10-CM | POA: Diagnosis not present

## 2015-10-29 LAB — POCT INR: INR: 2.2

## 2015-11-26 ENCOUNTER — Encounter: Payer: Medicare Other | Admitting: Pharmacist Clinician (PhC)/ Clinical Pharmacy Specialist

## 2015-11-28 ENCOUNTER — Other Ambulatory Visit: Payer: Self-pay | Admitting: Internal Medicine

## 2015-12-25 ENCOUNTER — Ambulatory Visit (INDEPENDENT_AMBULATORY_CARE_PROVIDER_SITE_OTHER): Payer: Medicare Other | Admitting: Pharmacist Clinician (PhC)/ Clinical Pharmacy Specialist

## 2015-12-25 DIAGNOSIS — Z7901 Long term (current) use of anticoagulants: Secondary | ICD-10-CM | POA: Diagnosis not present

## 2015-12-25 DIAGNOSIS — I480 Paroxysmal atrial fibrillation: Secondary | ICD-10-CM | POA: Diagnosis not present

## 2015-12-25 LAB — POCT INR: INR: 2.3

## 2015-12-26 ENCOUNTER — Encounter: Payer: Medicare Other | Admitting: Pharmacist Clinician (PhC)/ Clinical Pharmacy Specialist

## 2016-01-15 ENCOUNTER — Other Ambulatory Visit: Payer: Self-pay | Admitting: Pharmacist Clinician (PhC)/ Clinical Pharmacy Specialist

## 2016-01-15 MED ORDER — WARFARIN SODIUM 5 MG PO TABS: ORAL_TABLET | ORAL | Status: DC

## 2016-01-26 ENCOUNTER — Ambulatory Visit (INDEPENDENT_AMBULATORY_CARE_PROVIDER_SITE_OTHER): Payer: Medicare Other | Admitting: Pharmacist Clinician (PhC)/ Clinical Pharmacy Specialist

## 2016-01-26 DIAGNOSIS — I480 Paroxysmal atrial fibrillation: Secondary | ICD-10-CM

## 2016-01-26 DIAGNOSIS — Z7901 Long term (current) use of anticoagulants: Secondary | ICD-10-CM

## 2016-01-26 LAB — POCT INR: INR: 3.9

## 2016-02-11 ENCOUNTER — Encounter: Payer: Medicare Other | Admitting: Pharmacist Clinician (PhC)/ Clinical Pharmacy Specialist

## 2016-02-24 ENCOUNTER — Other Ambulatory Visit: Payer: Self-pay | Admitting: Internal Medicine

## 2016-02-25 NOTE — Telephone Encounter (Signed)
Rx request sent to pharmacy.  

## 2016-03-03 ENCOUNTER — Ambulatory Visit (INDEPENDENT_AMBULATORY_CARE_PROVIDER_SITE_OTHER): Payer: Medicare Other | Admitting: Pharmacist

## 2016-03-03 DIAGNOSIS — Z7901 Long term (current) use of anticoagulants: Secondary | ICD-10-CM | POA: Diagnosis not present

## 2016-03-03 DIAGNOSIS — I480 Paroxysmal atrial fibrillation: Secondary | ICD-10-CM

## 2016-03-03 LAB — POCT INR: INR: 4.6

## 2016-03-15 ENCOUNTER — Ambulatory Visit (INDEPENDENT_AMBULATORY_CARE_PROVIDER_SITE_OTHER): Payer: Medicare Other | Admitting: Pharmacist

## 2016-03-15 DIAGNOSIS — Z7901 Long term (current) use of anticoagulants: Secondary | ICD-10-CM

## 2016-03-15 DIAGNOSIS — I480 Paroxysmal atrial fibrillation: Secondary | ICD-10-CM | POA: Diagnosis not present

## 2016-03-15 LAB — POCT INR: INR: 2.6

## 2016-03-16 ENCOUNTER — Other Ambulatory Visit: Payer: Self-pay | Admitting: Endocrinology

## 2016-03-16 DIAGNOSIS — Z1231 Encounter for screening mammogram for malignant neoplasm of breast: Secondary | ICD-10-CM

## 2016-03-22 ENCOUNTER — Other Ambulatory Visit: Payer: Self-pay | Admitting: Internal Medicine

## 2016-03-22 NOTE — Telephone Encounter (Signed)
Rx request sent to pharmacy.  

## 2016-04-01 ENCOUNTER — Ambulatory Visit
Admission: RE | Admit: 2016-04-01 | Discharge: 2016-04-01 | Disposition: A | Discharge status: Home / Self Care | Payer: Medicare Other | Source: Ambulatory Visit | Attending: Endocrinology | Admitting: Endocrinology

## 2016-04-01 DIAGNOSIS — Z1231 Encounter for screening mammogram for malignant neoplasm of breast: Secondary | ICD-10-CM

## 2016-04-14 ENCOUNTER — Ambulatory Visit (INDEPENDENT_AMBULATORY_CARE_PROVIDER_SITE_OTHER): Payer: Medicare Other | Admitting: Pharmacist Clinician (PhC)/ Clinical Pharmacy Specialist

## 2016-04-14 DIAGNOSIS — Z7901 Long term (current) use of anticoagulants: Secondary | ICD-10-CM | POA: Diagnosis not present

## 2016-04-14 DIAGNOSIS — I480 Paroxysmal atrial fibrillation: Secondary | ICD-10-CM | POA: Diagnosis not present

## 2016-04-14 LAB — POCT INR: INR: 1.4

## 2016-04-28 ENCOUNTER — Ambulatory Visit (INDEPENDENT_AMBULATORY_CARE_PROVIDER_SITE_OTHER): Payer: Medicare Other | Admitting: Pharmacist

## 2016-04-28 DIAGNOSIS — I480 Paroxysmal atrial fibrillation: Secondary | ICD-10-CM

## 2016-04-28 DIAGNOSIS — Z7901 Long term (current) use of anticoagulants: Secondary | ICD-10-CM | POA: Diagnosis not present

## 2016-04-28 LAB — POCT INR: INR: 4.5

## 2016-05-05 ENCOUNTER — Other Ambulatory Visit: Payer: Self-pay | Admitting: Internal Medicine

## 2016-05-13 ENCOUNTER — Ambulatory Visit (INDEPENDENT_AMBULATORY_CARE_PROVIDER_SITE_OTHER): Payer: Medicare Other | Admitting: Pharmacist Clinician (PhC)/ Clinical Pharmacy Specialist

## 2016-05-13 DIAGNOSIS — I480 Paroxysmal atrial fibrillation: Secondary | ICD-10-CM | POA: Diagnosis not present

## 2016-05-13 DIAGNOSIS — Z7901 Long term (current) use of anticoagulants: Secondary | ICD-10-CM

## 2016-05-13 LAB — POCT INR: INR: 4.5

## 2016-05-28 ENCOUNTER — Ambulatory Visit (INDEPENDENT_AMBULATORY_CARE_PROVIDER_SITE_OTHER): Payer: Medicare Other | Admitting: Pharmacist

## 2016-05-28 DIAGNOSIS — I480 Paroxysmal atrial fibrillation: Secondary | ICD-10-CM | POA: Diagnosis not present

## 2016-05-28 DIAGNOSIS — Z7901 Long term (current) use of anticoagulants: Secondary | ICD-10-CM

## 2016-05-28 LAB — POCT INR: INR: 2.6

## 2016-06-02 ENCOUNTER — Other Ambulatory Visit: Payer: Self-pay | Admitting: Internal Medicine

## 2016-06-13 ENCOUNTER — Other Ambulatory Visit: Payer: Self-pay | Admitting: Internal Medicine

## 2016-06-15 NOTE — Telephone Encounter (Signed)
Rx(s) sent to pharmacy electronically.  

## 2016-06-16 ENCOUNTER — Ambulatory Visit (INDEPENDENT_AMBULATORY_CARE_PROVIDER_SITE_OTHER): Payer: Medicare Other | Admitting: Pharmacist

## 2016-06-16 DIAGNOSIS — I480 Paroxysmal atrial fibrillation: Secondary | ICD-10-CM | POA: Diagnosis not present

## 2016-06-16 DIAGNOSIS — Z7901 Long term (current) use of anticoagulants: Secondary | ICD-10-CM | POA: Diagnosis not present

## 2016-06-16 LAB — POCT INR: INR: 3.9

## 2016-07-02 ENCOUNTER — Telehealth: Payer: Self-pay | Admitting: *Deleted

## 2016-07-02 ENCOUNTER — Ambulatory Visit (INDEPENDENT_AMBULATORY_CARE_PROVIDER_SITE_OTHER): Payer: Medicare Other | Admitting: Pharmacist

## 2016-07-02 DIAGNOSIS — Z7901 Long term (current) use of anticoagulants: Secondary | ICD-10-CM | POA: Diagnosis not present

## 2016-07-02 DIAGNOSIS — I480 Paroxysmal atrial fibrillation: Secondary | ICD-10-CM

## 2016-07-02 LAB — POCT INR: INR: 2.6

## 2016-07-02 NOTE — Telephone Encounter (Signed)
Brought document with her to INR check today. Form was faxed back to [facility] [phone] pt in office.   She previously has not held coumadin for cleaning and will remain on her current dose for any dental cleanings.

## 2016-07-02 NOTE — Telephone Encounter (Signed)
Pt is needing clearance to hold warfarin prior to dental cleaning below the gum line that will likely cause bleeding. She has a follow up with dr hilty 07-15-16. Will forward to the [facility] clinic for advise.

## 2016-07-05 ENCOUNTER — Other Ambulatory Visit: Payer: Self-pay | Admitting: Internal Medicine

## 2016-07-06 NOTE — Telephone Encounter (Signed)
Rx(s) sent to pharmacy electronically.  

## 2016-07-15 ENCOUNTER — Ambulatory Visit (INDEPENDENT_AMBULATORY_CARE_PROVIDER_SITE_OTHER): Payer: Medicare Other | Admitting: Pharmacist

## 2016-07-15 ENCOUNTER — Ambulatory Visit (INDEPENDENT_AMBULATORY_CARE_PROVIDER_SITE_OTHER): Payer: Medicare Other | Admitting: Internal Medicine

## 2016-07-15 ENCOUNTER — Encounter: Payer: Self-pay | Admitting: Internal Medicine

## 2016-07-15 VITALS — BP 150/64 | HR 54 | Ht 62.0 in | Wt 165.6 lb

## 2016-07-15 DIAGNOSIS — E038 Other specified hypothyroidism: Secondary | ICD-10-CM | POA: Diagnosis not present

## 2016-07-15 DIAGNOSIS — I480 Paroxysmal atrial fibrillation: Secondary | ICD-10-CM | POA: Diagnosis not present

## 2016-07-15 DIAGNOSIS — Z7901 Long term (current) use of anticoagulants: Secondary | ICD-10-CM

## 2016-07-15 DIAGNOSIS — I10 Essential (primary) hypertension: Secondary | ICD-10-CM | POA: Diagnosis not present

## 2016-07-15 LAB — POCT INR: INR: 2.1

## 2016-07-15 MED ORDER — FUROSEMIDE 20 MG PO TABS: 20.0000 mg | ORAL_TABLET | ORAL | 10 refills | Status: DC | PRN

## 2016-07-15 MED ORDER — POTASSIUM CHLORIDE CRYS ER 20 MEQ PO TBCR: 20.0000 meq | EXTENDED_RELEASE_TABLET | Freq: Every day | ORAL | 10 refills | Status: DC

## 2016-07-15 MED ORDER — ATORVASTATIN CALCIUM 20 MG PO TABS: 20.0000 mg | ORAL_TABLET | Freq: Every day | ORAL | 10 refills | Status: DC

## 2016-07-15 MED ORDER — DILTIAZEM HCL ER COATED BEADS 120 MG PO CP24: 120.0000 mg | ORAL_CAPSULE | Freq: Two times a day (BID) | ORAL | 10 refills | Status: DC

## 2016-07-15 MED ORDER — METOPROLOL SUCCINATE ER 50 MG PO TB24: 50.0000 mg | ORAL_TABLET | Freq: Two times a day (BID) | ORAL | 10 refills | Status: DC

## 2016-07-15 MED ORDER — HYDROCHLOROTHIAZIDE 50 MG PO TABS: 50.0000 mg | ORAL_TABLET | Freq: Every day | ORAL | 10 refills | Status: DC

## 2016-07-15 NOTE — Patient Instructions (Addendum)
Medication Instructions:  Your physician recommends that you continue on your current medications as directed. Please refer to the Current Medication list given to you today.  Labwork: None   Testing/Procedures: None   Follow-Up: Your physician wants you to follow-up in: 12 months with Dr Debara Pickett. You will receive a reminder letter in the mail two months in advance. If you don't receive a letter, please call our office to schedule the follow-up appointment.  Any Other Special Instructions Will Be Listed Below (If Applicable).  Rx's sent to pharmacy  If you need a refill on your cardiac medications before your next appointment, please call your pharmacy.

## 2016-07-15 NOTE — Progress Notes (Signed)
OFFICE NOTE  Chief Complaint:  Routine office visit  Primary Care Physician: Dwan Bolt, MD  HPI:  [name] is a 81 year old overweight African American female with a history of paroxysmal atrial fibrillation on Coumadin, hypothyroidism, dyslipidemia, diabetes mellitus type 2, hypertension.  She had an episode of PAF in 2014, but this went away fairly quickly and she is not been symptomatic since then. This may be related to her not taking her medications as scheduled. Since then she's been taking her metoprolol twice daily and has had no real problems. Her warfarin level remains therapeutic and well controlled. Over the past several months she's had a problem with breakthrough paroxysmal atrial fibrillation.  In fact at this point she seems to be persistent.  She saw 2 other physician assistants in our office to were able to get her rate controlled. Once she was at a normal ventricular rate, she felt fairly good. In fact she cannot really tell difference when she is in A. fib versus when she is in sinus rhythm.  Previously her episode was possibly related to thyroid and balance, however her thyroid levels have been well controlled as has her diabetes.   [name] returns today for follow-up. Her EKG shows sinus rhythm. It is notable that she is bradycardic today with heart rate in the upper 40s. INR was checked in the office today and is low at 1.7. This will be adjusted by Erasmo Downer, our anticoagulation pharmacist. She denies any complaints such as shortness of breath, increasing fatigue, presyncope or syncopal symptoms. She also denies any chest pain or palpitations and is unaware of any recurrent atrial fibrillation. She's recently been started on an injectable insulin in addition to her oral Glucophage for diabetes. Her cholesterol is also managed by her primary care provider.  07/15/2016  [name] returns today for follow-up. She's been followed monthly for her INRs which been  therapeutic. She reports feeling well denies any chest pain or recurrent atrial fibrillation. EKG shows sinus rhythm. Overall she feels that she is doing well. Blood prssure was mildly elevated today recently was 122/60 in the office.  PMHx:  Past Medical History:  Diagnosis Date  . Colon polyp   . Diabetes mellitus   . Hyperlipidemia   . Hypertension   . Poor appetite   . Thyroid disease     Past Surgical History:  Procedure Laterality Date  . CARDIOVASCULAR STRESS TEST  07/15/2006   Normal scan, no ECG changes  . TRANSTHORACIC ECHOCARDIOGRAM  01/08/2011   EF 60-65%, moderae mitral regurg, LA mild-moderately dilated,    FAMHx:  Family History  Problem Relation Age of Onset  . Diabetes Mother   . Hypertension Father   . Hyperlipidemia Father   . Diabetes Sister   . Hypertension Sister   . Hyperlipidemia Sister   . Hypertension Brother   . Stroke Sister   . Hypertension Sister   . Diabetes Sister   . Cancer Daughter   . Heart failure Child     SOCHx:   reports that she quit smoking about 13 years ago. Her smoking use included Cigarettes. She has never used smokeless tobacco. She reports that she does not drink alcohol or use drugs.  ALLERGIES:  No Known Allergies  ROS: A comprehensive review of systems was negative.  HOME MEDS: Current Outpatient Prescriptions  Medication Sig Dispense Refill  . atorvastatin (LIPITOR) 20 MG tablet Take 1 tablet (20 mg total) by mouth daily at 6 PM. 30 tablet 10  .  diltiazem (CARDIZEM CD) 120 MG 24 hr capsule Take 1 capsule (120 mg total) by mouth 2 (two) times daily. 60 capsule 10  . furosemide (LASIX) 20 MG tablet Take 1 tablet (20 mg total) by mouth as needed for edema. 30 tablet 10  . hydrochlorothiazide (HYDRODIURIL) 50 MG tablet Take 1 tablet (50 mg total) by mouth daily. Please schedule appointment for refills. 30 tablet 10  . levothyroxine (SYNTHROID, LEVOTHROID) 100 MCG tablet Take 100 mcg by mouth daily before breakfast.      . metFORMIN (GLUCOPHAGE) 500 MG tablet Take 1,000 mg by mouth 2 (two) times daily with a meal.     . metoprolol succinate (TOPROL-XL) 50 MG 24 hr tablet Take 1 tablet (50 mg total) by mouth 2 (two) times daily. 60 tablet 10  . potassium chloride SA (KLOR-CON M20) 20 MEQ tablet Take 1 tablet (20 mEq total) by mouth daily. 30 tablet 10  . warfarin (COUMADIN) 5 MG tablet TAKE 1 & 1/2 TO 2 TABLETS DAILY AS DIRECTED BY COUMADIN CLINIC 50 tablet 2   No current facility-administered medications for this visit.     LABS/IMAGING: No results found for this or any previous visit (from the past 48 hour(s)). No results found.  VITALS: BP (!) 150/64   Pulse (!) 54   Ht 5\' 2"  (1.575 m)   Wt 165 lb 9.6 oz (75.1 kg)   BMI 30.29 kg/m   EXAM: General appearance: alert and no distress Neck: no adenopathy, no carotid bruit, no JVD, supple, symmetrical, trachea midline and thyroid not enlarged, symmetric, no tenderness/mass/nodules Lungs: clear to auscultation bilaterally Heart: irregularly irregular rhythm, rate-controlled, S1, S2 normal, no murmur, click, rub or gallop Abdomen: soft, non-tender; bowel sounds normal; no masses,  no organomegaly Extremities: extremities normal, atraumatic, no cyanosis or edema Pulses: 2+ and symmetric Skin: Skin color, texture, turgor normal. No rashes or lesions Neurologic: Grossly normal  EKG: Sinus bradycardia at 54  ASSESSMENT: 1. Paroxysmal atrial fibrillation - CHADSVASC score of 4 on warfarin 2. Hypertension - at goal 3. Diabetes type 2 - at goal 4. Hypothyroidism - controlled 5. Dyslipidemia - at goal  PLAN: 1.   Mrs. Schranz denies any recurrent a-fib over the past year. EKG is sinus brady today. INR has been therapeutic on warfarin. HTN is generally well-controlled. DM2, thyroid and cholesterol followed by her PCP.  Follow-up annually or sooner as necessary.  Pixie Casino, MD, [facility] Attending Cardiologist [place] 07/15/2016, 1:18 PM

## 2016-07-19 ENCOUNTER — Other Ambulatory Visit: Payer: Self-pay | Admitting: Internal Medicine

## 2016-08-02 ENCOUNTER — Other Ambulatory Visit: Payer: Self-pay | Admitting: Internal Medicine

## 2016-08-10 ENCOUNTER — Other Ambulatory Visit: Payer: Self-pay | Admitting: Internal Medicine

## 2016-08-18 ENCOUNTER — Ambulatory Visit (INDEPENDENT_AMBULATORY_CARE_PROVIDER_SITE_OTHER): Payer: Medicare Other | Admitting: Pharmacist Clinician (PhC)/ Clinical Pharmacy Specialist

## 2016-08-18 DIAGNOSIS — Z7901 Long term (current) use of anticoagulants: Secondary | ICD-10-CM

## 2016-08-18 DIAGNOSIS — I480 Paroxysmal atrial fibrillation: Secondary | ICD-10-CM | POA: Diagnosis not present

## 2016-08-18 LAB — POCT INR: INR: 2

## 2016-08-23 ENCOUNTER — Other Ambulatory Visit: Payer: Self-pay | Admitting: Internal Medicine

## 2016-09-13 ENCOUNTER — Other Ambulatory Visit: Payer: Self-pay | Admitting: Internal Medicine

## 2016-09-22 ENCOUNTER — Ambulatory Visit (INDEPENDENT_AMBULATORY_CARE_PROVIDER_SITE_OTHER): Payer: Medicare Other | Admitting: Pharmacist

## 2016-09-22 DIAGNOSIS — I480 Paroxysmal atrial fibrillation: Secondary | ICD-10-CM

## 2016-09-22 DIAGNOSIS — Z7901 Long term (current) use of anticoagulants: Secondary | ICD-10-CM

## 2016-09-22 LAB — POCT INR: INR: 1.6

## 2016-10-07 ENCOUNTER — Ambulatory Visit (INDEPENDENT_AMBULATORY_CARE_PROVIDER_SITE_OTHER): Payer: Medicare Other | Admitting: Pharmacist

## 2016-10-07 DIAGNOSIS — Z7901 Long term (current) use of anticoagulants: Secondary | ICD-10-CM

## 2016-10-07 DIAGNOSIS — I480 Paroxysmal atrial fibrillation: Secondary | ICD-10-CM

## 2016-10-07 LAB — POCT INR: INR: 1.5

## 2016-11-03 ENCOUNTER — Ambulatory Visit (INDEPENDENT_AMBULATORY_CARE_PROVIDER_SITE_OTHER): Payer: Medicare Other | Admitting: Pharmacist

## 2016-11-03 DIAGNOSIS — I480 Paroxysmal atrial fibrillation: Secondary | ICD-10-CM | POA: Diagnosis not present

## 2016-11-03 DIAGNOSIS — Z7901 Long term (current) use of anticoagulants: Secondary | ICD-10-CM | POA: Diagnosis not present

## 2016-11-03 LAB — POCT INR: INR: 2

## 2016-12-03 ENCOUNTER — Ambulatory Visit (INDEPENDENT_AMBULATORY_CARE_PROVIDER_SITE_OTHER): Payer: Medicare Other | Admitting: Pharmacist

## 2016-12-03 DIAGNOSIS — I480 Paroxysmal atrial fibrillation: Secondary | ICD-10-CM

## 2016-12-03 DIAGNOSIS — Z7901 Long term (current) use of anticoagulants: Secondary | ICD-10-CM

## 2016-12-03 LAB — POCT INR: INR: 3.5

## 2016-12-03 MED ORDER — WARFARIN SODIUM 5 MG PO TABS: ORAL_TABLET | ORAL | 2 refills | Status: DC

## 2016-12-03 MED ORDER — FUROSEMIDE 20 MG PO TABS: 20.0000 mg | ORAL_TABLET | ORAL | 0 refills | Status: DC | PRN

## 2016-12-03 MED ORDER — DILTIAZEM HCL ER COATED BEADS 120 MG PO CP24: 120.0000 mg | ORAL_CAPSULE | Freq: Two times a day (BID) | ORAL | 10 refills | Status: DC

## 2016-12-17 ENCOUNTER — Ambulatory Visit (INDEPENDENT_AMBULATORY_CARE_PROVIDER_SITE_OTHER): Payer: Medicare Other | Admitting: Pharmacist Clinician (PhC)/ Clinical Pharmacy Specialist

## 2016-12-17 DIAGNOSIS — I480 Paroxysmal atrial fibrillation: Secondary | ICD-10-CM | POA: Diagnosis not present

## 2016-12-17 DIAGNOSIS — Z7901 Long term (current) use of anticoagulants: Secondary | ICD-10-CM

## 2016-12-17 LAB — POCT INR: INR: 1.3

## 2016-12-31 ENCOUNTER — Ambulatory Visit (INDEPENDENT_AMBULATORY_CARE_PROVIDER_SITE_OTHER): Payer: Medicare Other | Admitting: Pharmacist

## 2016-12-31 DIAGNOSIS — I480 Paroxysmal atrial fibrillation: Secondary | ICD-10-CM | POA: Diagnosis not present

## 2016-12-31 DIAGNOSIS — Z7901 Long term (current) use of anticoagulants: Secondary | ICD-10-CM | POA: Diagnosis not present

## 2016-12-31 LAB — POCT INR: INR: 3.1

## 2017-01-21 ENCOUNTER — Ambulatory Visit (INDEPENDENT_AMBULATORY_CARE_PROVIDER_SITE_OTHER): Payer: Medicare Other | Admitting: Pharmacist Clinician (PhC)/ Clinical Pharmacy Specialist

## 2017-01-21 DIAGNOSIS — Z7901 Long term (current) use of anticoagulants: Secondary | ICD-10-CM | POA: Diagnosis not present

## 2017-01-21 DIAGNOSIS — I480 Paroxysmal atrial fibrillation: Secondary | ICD-10-CM

## 2017-01-21 LAB — POCT INR: INR: 4.1

## 2017-02-11 ENCOUNTER — Ambulatory Visit (INDEPENDENT_AMBULATORY_CARE_PROVIDER_SITE_OTHER): Payer: Medicare Other | Admitting: Pharmacist

## 2017-02-11 DIAGNOSIS — Z7901 Long term (current) use of anticoagulants: Secondary | ICD-10-CM

## 2017-02-11 DIAGNOSIS — I480 Paroxysmal atrial fibrillation: Secondary | ICD-10-CM

## 2017-02-11 LAB — POCT INR: INR: 2.9

## 2017-03-04 ENCOUNTER — Ambulatory Visit (INDEPENDENT_AMBULATORY_CARE_PROVIDER_SITE_OTHER): Payer: Medicare Other | Admitting: Pharmacist

## 2017-03-04 DIAGNOSIS — I480 Paroxysmal atrial fibrillation: Secondary | ICD-10-CM | POA: Diagnosis not present

## 2017-03-04 DIAGNOSIS — Z7901 Long term (current) use of anticoagulants: Secondary | ICD-10-CM | POA: Diagnosis not present

## 2017-03-04 LAB — POCT INR: INR: 4.2

## 2017-03-04 NOTE — Patient Instructions (Signed)
Vitamin K Foods and Warfarin Warfarin is a blood thinner (anticoagulant). Anticoagulant medicines help prevent the formation of blood clots. These medicines work by decreasing the activity of vitamin K, which promotes normal blood clotting. When you take warfarin, problems can occur from suddenly increasing or decreasing the amount of vitamin K that you eat from one day to the next. Problems may include:  Blood clots.  Bleeding. What general guidelines do I need to follow? To avoid problems when taking warfarin:  Eat a balanced diet that includes:  Fresh fruits and vegetables.  Whole grains.  Low-fat dairy products.  Lean proteins, such as fish, eggs, and lean cuts of meat.  Keep your intake of vitamin K consistent from day to day. To do this:  Avoid eating large amounts of vitamin K one day and low amounts of vitamin K the next day.  If you take a multivitamin that contains vitamin K, be sure to take it every day.  Know which foods contain vitamin K. Use the lists below to understand serving sizes and the amount of vitamin K in one serving.  Avoid major changes in your diet. If you are going to change your diet, talk with your health care provider before making changes.  Work with a nutrition specialist (dietitian) to develop a meal plan that works best for you. High vitamin K foods Foods that are high in vitamin K contain more than 100 mcg (micrograms) per serving. These include:  Broccoli (cooked) -  cup has 110 mcg.  Brussels sprouts (cooked) -  cup has 109 mcg.  Greens, beet (cooked) -  cup has 350 mcg.  Greens, collard (cooked) -  cup has 418 mcg.  Greens, turnip (cooked) -  cup has 265 mcg.  Green onions or scallions -  cup has 105 mcg.  Kale (fresh or frozen) -  cup has 531 mcg.  Parsley (raw) - 10 sprigs has 164 mcg.  Spinach (cooked) -  cup has 444 mcg.  Swiss chard (cooked) -  cup has 287 mcg. Moderate vitamin K foods Foods that have a  moderate amount of vitamin K contain 25-100 mcg per serving. These include:  Asparagus (cooked) - 5 spears have 38 mcg.  Black-eyed peas (dried) -  cup has 32 mcg.  Cabbage (cooked) -  cup has 37 mcg.  Kiwi fruit - 1 medium has 31 mcg.  Lettuce - 1 cup has 57-63 mcg.  Okra (frozen) -  cup has 44 mcg.  Prunes (dried) - 5 prunes have 25 mcg.  Watercress (raw) - 1 cup has 85 mcg. Low vitamin K foods Foods low in vitamin K contain less than 25 mcg per serving. These include:  Artichoke - 1 medium has 18 mcg.  Avocado - 1 oz. has 6 mcg.  Blueberries -  cup has 14 mcg.  Cabbage (raw) -  cup has 21 mcg.  Carrots (cooked) -  cup has 11 mcg.  Cauliflower (raw) -  cup has 11 mcg.  Cucumber with peel (raw) -  cup has 9 mcg.  Grapes -  cup has 12 mcg.  Mango - 1 medium has 9 mcg.  Nuts - 1 oz. has 15 mcg.  Pear - 1 medium has 8 mcg.  Peas (cooked) -  cup has 19 mcg.  Pickles - 1 spear has 14 mcg.  Pumpkin seeds - 1 oz. has 13 mcg.  Sauerkraut (canned) -  cup has 16 mcg.  Soybeans (cooked) -  cup has 16 mcg.    Tomato (raw) - 1 medium has 10 mcg.  Tomato sauce -  cup has 17 mcg. Vitamin K-free foods If a food contain less than 5 mcg per serving, it is considered to have no vitamin K. These foods include:  Bread and cereal products.  Cheese.  Eggs.  Fish and shellfish.  Meat and poultry.  Milk and dairy products.  Sunflower seeds. Actual amounts of vitamin K in foods may be different depending on processing. Talk with your dietitian about what foods you can eat and what foods you should avoid. This information is not intended to replace advice given to you by your health care provider. Make sure you discuss any questions you have with your health care provider. Document Released: 07/25/2009 Document Revised: 04/18/2016 Document Reviewed: 12/31/2015 Elsevier Interactive Patient Education  2017 Elsevier Inc.  

## 2017-03-21 ENCOUNTER — Ambulatory Visit (INDEPENDENT_AMBULATORY_CARE_PROVIDER_SITE_OTHER): Payer: Medicare Other | Admitting: Pharmacist

## 2017-03-21 DIAGNOSIS — I480 Paroxysmal atrial fibrillation: Secondary | ICD-10-CM | POA: Diagnosis not present

## 2017-03-21 DIAGNOSIS — Z7901 Long term (current) use of anticoagulants: Secondary | ICD-10-CM

## 2017-03-21 LAB — POCT INR: INR: 4.2

## 2017-03-31 ENCOUNTER — Other Ambulatory Visit: Payer: Self-pay | Admitting: Internal Medicine

## 2017-03-31 DIAGNOSIS — Z1231 Encounter for screening mammogram for malignant neoplasm of breast: Secondary | ICD-10-CM

## 2017-04-02 ENCOUNTER — Other Ambulatory Visit: Payer: Self-pay | Admitting: Internal Medicine

## 2017-04-08 ENCOUNTER — Ambulatory Visit (INDEPENDENT_AMBULATORY_CARE_PROVIDER_SITE_OTHER): Payer: Medicare Other | Admitting: Pharmacist

## 2017-04-08 DIAGNOSIS — Z7901 Long term (current) use of anticoagulants: Secondary | ICD-10-CM

## 2017-04-08 DIAGNOSIS — I480 Paroxysmal atrial fibrillation: Secondary | ICD-10-CM

## 2017-04-08 LAB — POCT INR: INR: 1.8

## 2017-04-29 ENCOUNTER — Ambulatory Visit (INDEPENDENT_AMBULATORY_CARE_PROVIDER_SITE_OTHER): Payer: Medicare Other | Admitting: Pharmacist

## 2017-04-29 DIAGNOSIS — Z7901 Long term (current) use of anticoagulants: Secondary | ICD-10-CM | POA: Diagnosis not present

## 2017-04-29 DIAGNOSIS — I480 Paroxysmal atrial fibrillation: Secondary | ICD-10-CM

## 2017-04-29 LAB — POCT INR: INR: 1.9

## 2017-05-18 ENCOUNTER — Ambulatory Visit
Admission: RE | Admit: 2017-05-18 | Discharge: 2017-05-18 | Disposition: A | Discharge status: Home / Self Care | Payer: Medicare Other | Source: Ambulatory Visit | Attending: Internal Medicine | Admitting: Internal Medicine

## 2017-05-18 DIAGNOSIS — Z1231 Encounter for screening mammogram for malignant neoplasm of breast: Secondary | ICD-10-CM

## 2017-05-30 ENCOUNTER — Ambulatory Visit (INDEPENDENT_AMBULATORY_CARE_PROVIDER_SITE_OTHER): Payer: Medicare Other | Admitting: Pharmacist Clinician (PhC)/ Clinical Pharmacy Specialist

## 2017-05-30 DIAGNOSIS — Z7901 Long term (current) use of anticoagulants: Secondary | ICD-10-CM | POA: Diagnosis not present

## 2017-05-30 DIAGNOSIS — I480 Paroxysmal atrial fibrillation: Secondary | ICD-10-CM

## 2017-05-30 LAB — POCT INR: INR: 2.2

## 2017-06-07 ENCOUNTER — Emergency Department (HOSPITAL_COMMUNITY): Payer: Medicare Other

## 2017-06-07 ENCOUNTER — Encounter (HOSPITAL_COMMUNITY): Payer: Self-pay | Admitting: *Deleted

## 2017-06-07 ENCOUNTER — Observation Stay (HOSPITAL_COMMUNITY)
Admission: EM | Admit: 2017-06-07 | Discharge: 2017-06-09 | Disposition: A | Discharge status: Home / Self Care | Payer: Medicare Other | Attending: Internal Medicine | Admitting: Internal Medicine

## 2017-06-07 DIAGNOSIS — E119 Type 2 diabetes mellitus without complications: Secondary | ICD-10-CM | POA: Insufficient documentation

## 2017-06-07 DIAGNOSIS — I10 Essential (primary) hypertension: Secondary | ICD-10-CM | POA: Diagnosis present

## 2017-06-07 DIAGNOSIS — M25552 Pain in left hip: Principal | ICD-10-CM | POA: Diagnosis present

## 2017-06-07 DIAGNOSIS — I482 Chronic atrial fibrillation: Secondary | ICD-10-CM | POA: Insufficient documentation

## 2017-06-07 DIAGNOSIS — Z7901 Long term (current) use of anticoagulants: Secondary | ICD-10-CM | POA: Diagnosis not present

## 2017-06-07 DIAGNOSIS — M47816 Spondylosis without myelopathy or radiculopathy, lumbar region: Secondary | ICD-10-CM

## 2017-06-07 DIAGNOSIS — Z86010 Personal history of colonic polyps: Secondary | ICD-10-CM | POA: Diagnosis not present

## 2017-06-07 DIAGNOSIS — R52 Pain, unspecified: Secondary | ICD-10-CM

## 2017-06-07 DIAGNOSIS — Z7984 Long term (current) use of oral hypoglycemic drugs: Secondary | ICD-10-CM | POA: Diagnosis not present

## 2017-06-07 DIAGNOSIS — Z87891 Personal history of nicotine dependence: Secondary | ICD-10-CM | POA: Insufficient documentation

## 2017-06-07 DIAGNOSIS — E039 Hypothyroidism, unspecified: Secondary | ICD-10-CM | POA: Insufficient documentation

## 2017-06-07 DIAGNOSIS — Z79899 Other long term (current) drug therapy: Secondary | ICD-10-CM | POA: Insufficient documentation

## 2017-06-07 DIAGNOSIS — E1165 Type 2 diabetes mellitus with hyperglycemia: Secondary | ICD-10-CM

## 2017-06-07 DIAGNOSIS — I480 Paroxysmal atrial fibrillation: Secondary | ICD-10-CM | POA: Diagnosis not present

## 2017-06-07 DIAGNOSIS — I708 Atherosclerosis of other arteries: Secondary | ICD-10-CM | POA: Insufficient documentation

## 2017-06-07 DIAGNOSIS — M7072 Other bursitis of hip, left hip: Secondary | ICD-10-CM

## 2017-06-07 DIAGNOSIS — E785 Hyperlipidemia, unspecified: Secondary | ICD-10-CM | POA: Insufficient documentation

## 2017-06-07 LAB — CBC WITH DIFFERENTIAL/PLATELET
Basophils Absolute: 0 10*3/uL (ref 0.0–0.1)
Basophils Relative: 0 %
EOS ABS: 0 10*3/uL (ref 0.0–0.7)
Eosinophils Relative: 0 %
HEMATOCRIT: 37.5 % (ref 36.0–46.0)
HEMOGLOBIN: 12.6 g/dL (ref 12.0–15.0)
LYMPHS ABS: 1 10*3/uL (ref 0.7–4.0)
Lymphocytes Relative: 12 %
MCH: 27 pg (ref 26.0–34.0)
MCHC: 33.6 g/dL (ref 30.0–36.0)
MCV: 80.3 fL (ref 78.0–100.0)
MONOS PCT: 4 %
Monocytes Absolute: 0.4 10*3/uL (ref 0.1–1.0)
NEUTROS ABS: 7.2 10*3/uL (ref 1.7–7.7)
NEUTROS PCT: 84 %
Platelets: 261 10*3/uL (ref 150–400)
RBC: 4.67 MIL/uL (ref 3.87–5.11)
RDW: 14.8 % (ref 11.5–15.5)
WBC: 8.6 10*3/uL (ref 4.0–10.5)

## 2017-06-07 LAB — PROTIME-INR
INR: 2.04
Prothrombin Time: 22.9 seconds — ABNORMAL HIGH (ref 11.4–15.2)

## 2017-06-07 LAB — BASIC METABOLIC PANEL
Anion gap: 9 (ref 5–15)
BUN: 17 mg/dL (ref 6–20)
CALCIUM: 8.8 mg/dL — AB (ref 8.9–10.3)
CO2: 30 mmol/L (ref 22–32)
CREATININE: 0.71 mg/dL (ref 0.44–1.00)
Chloride: 97 mmol/L — ABNORMAL LOW (ref 101–111)
GFR calc non Af Amer: >60 mL/min (ref >60)
Glucose, Bld: 156 mg/dL — ABNORMAL HIGH (ref 65–99)
Potassium: 3.7 mmol/L (ref 3.5–5.1)
SODIUM: 136 mmol/L (ref 135–145)

## 2017-06-07 LAB — URINALYSIS, ROUTINE W REFLEX MICROSCOPIC
BACTERIA UA: NONE SEEN
BILIRUBIN URINE: NEGATIVE
Glucose, UA: NEGATIVE mg/dL
KETONES UR: NEGATIVE mg/dL
Nitrite: NEGATIVE
Protein, ur: NEGATIVE mg/dL
Specific Gravity, Urine: 1.011 (ref 1.005–1.030)
pH: 6 (ref 5.0–8.0)

## 2017-06-07 MED ORDER — ACETAMINOPHEN 325 MG PO TABS
650.0000 mg | ORAL_TABLET | Freq: Four times a day (QID) | ORAL | Status: DC | PRN
  Administered 2017-06-08: 05:00:00 650 mg via ORAL
  Filled 2017-06-07: qty 2

## 2017-06-07 MED ORDER — HYDROCODONE-ACETAMINOPHEN 5-325 MG PO TABS
1.0000 | ORAL_TABLET | Freq: Once | ORAL | Status: AC
  Administered 2017-06-07: 1 via ORAL
  Filled 2017-06-07: qty 1

## 2017-06-07 MED ORDER — FENTANYL CITRATE (PF) 100 MCG/2ML IJ SOLN
50.0000 ug | Freq: Once | INTRAMUSCULAR | Status: AC
  Administered 2017-06-07: 50 ug via INTRAVENOUS
  Filled 2017-06-07: qty 2

## 2017-06-07 MED ORDER — ONDANSETRON HCL 4 MG PO TABS: 4.0000 mg | ORAL_TABLET | Freq: Four times a day (QID) | ORAL | Status: DC | PRN

## 2017-06-07 MED ORDER — DEXAMETHASONE SODIUM PHOSPHATE 10 MG/ML IJ SOLN
10.0000 mg | Freq: Once | INTRAMUSCULAR | Status: AC
  Administered 2017-06-07: 10 mg via INTRAVENOUS
  Filled 2017-06-07: qty 1

## 2017-06-07 MED ORDER — HYDROCHLOROTHIAZIDE 25 MG PO TABS
50.0000 mg | ORAL_TABLET | Freq: Every day | ORAL | Status: DC
  Administered 2017-06-08 – 2017-06-09 (×2): 50 mg via ORAL
  Filled 2017-06-07 (×2): qty 2

## 2017-06-07 MED ORDER — METOPROLOL SUCCINATE ER 50 MG PO TB24
50.0000 mg | ORAL_TABLET | Freq: Two times a day (BID) | ORAL | Status: DC
  Administered 2017-06-08 – 2017-06-09 (×3): 50 mg via ORAL
  Filled 2017-06-07 (×3): qty 1

## 2017-06-07 MED ORDER — ONDANSETRON HCL 4 MG/2ML IJ SOLN
4.0000 mg | Freq: Once | INTRAMUSCULAR | Status: AC
  Administered 2017-06-07: 4 mg via INTRAVENOUS
  Filled 2017-06-07: qty 2

## 2017-06-07 MED ORDER — INSULIN ASPART 100 UNIT/ML ~~LOC~~ SOLN
0.0000 [IU] | Freq: Three times a day (TID) | SUBCUTANEOUS | Status: DC
  Administered 2017-06-08: 7 [IU] via SUBCUTANEOUS
  Administered 2017-06-08: 19:00:00 9 [IU] via SUBCUTANEOUS
  Administered 2017-06-08: 7 [IU] via SUBCUTANEOUS

## 2017-06-07 MED ORDER — WARFARIN - PHARMACIST DOSING INPATIENT: Freq: Every day | Status: DC

## 2017-06-07 MED ORDER — ATORVASTATIN CALCIUM 20 MG PO TABS
20.0000 mg | ORAL_TABLET | Freq: Every day | ORAL | Status: DC
  Administered 2017-06-08: 18:00:00 20 mg via ORAL
  Filled 2017-06-07: qty 1

## 2017-06-07 MED ORDER — DILTIAZEM HCL ER COATED BEADS 120 MG PO CP24
120.0000 mg | ORAL_CAPSULE | Freq: Two times a day (BID) | ORAL | Status: DC
  Administered 2017-06-07 – 2017-06-09 (×4): 120 mg via ORAL
  Filled 2017-06-07 (×4): qty 1

## 2017-06-07 MED ORDER — MORPHINE SULFATE (PF) 4 MG/ML IV SOLN
4.0000 mg | Freq: Once | INTRAVENOUS | Status: AC
  Administered 2017-06-07: 4 mg via INTRAVENOUS
  Filled 2017-06-07: qty 1

## 2017-06-07 MED ORDER — ONDANSETRON HCL 4 MG/2ML IJ SOLN: 4.0000 mg | Freq: Four times a day (QID) | INTRAMUSCULAR | Status: DC | PRN

## 2017-06-07 MED ORDER — ACETAMINOPHEN 650 MG RE SUPP: 650.0000 mg | Freq: Four times a day (QID) | RECTAL | Status: DC | PRN

## 2017-06-07 MED ORDER — WARFARIN SODIUM 5 MG PO TABS
5.0000 mg | ORAL_TABLET | Freq: Once | ORAL | Status: AC
  Administered 2017-06-07: 5 mg via ORAL
  Filled 2017-06-07: qty 1

## 2017-06-07 MED ORDER — LEVOTHYROXINE SODIUM 100 MCG PO TABS
100.0000 ug | ORAL_TABLET | Freq: Every day | ORAL | Status: DC
  Administered 2017-06-08 – 2017-06-09 (×2): 100 ug via ORAL
  Filled 2017-06-07 (×2): qty 1

## 2017-06-07 NOTE — H&P (Addendum)
History and Physical    [name] YHC:[id number] DOB: 1935/10/10 DOA: 06/07/2017  PCP: Anda Kraft, MD  Patient coming from: Home.  Chief Complaint: Left hip pain.  HPI: [name] is a 82 y.o. female with history of hypertension, A. fib, diabetes mellitus presents to the ER because of difficulty ambulating due to severe pain in the left hip. Patient states that 15 years ago she had a similar episode and was told she had arthritis which improved with physical therapy. Patient states over the last 1 week patient has been progressively having increasing pain. 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   ED Course: In the ER x-ray of the hip and CT of the left hip were unremarkable. Since patient is finding it difficult to ambulate and is needing assistance due to pain patient has been admitted for further observation. Pain improved with pain relief medication.  Review of Systems: As per HPI, rest all negative.   Past Medical History:  Diagnosis Date  . Colon polyp   . Diabetes mellitus   . Hyperlipidemia   . Hypertension   . Poor appetite   . Thyroid disease     Past Surgical History:  Procedure Laterality Date  . BREAST EXCISIONAL BIOPSY Right   . CARDIOVASCULAR STRESS TEST  07/15/2006   Normal scan, no ECG changes  . TRANSTHORACIC ECHOCARDIOGRAM  01/08/2011   EF 60-65%, moderae mitral regurg, LA mild-moderately dilated,     reports that she quit smoking about 14 years ago. Her smoking use included Cigarettes. She has never used smokeless tobacco. She reports that she does not drink alcohol or use drugs.  No Known Allergies  Family History  Problem Relation Age of Onset  . Diabetes Mother   . Hypertension Father   . Hyperlipidemia Father   . Diabetes Sister   . Hypertension Sister   . Hyperlipidemia Sister   . Hypertension Brother   . Stroke Sister   . Hypertension Sister   . Diabetes Sister   . Cancer Daughter   . Heart failure  Child   . Breast cancer Neg Hx     Prior to Admission medications   Medication Sig Start Date End Date Taking? Authorizing Provider  atorvastatin (LIPITOR) 20 MG tablet Take 1 tablet (20 mg total) by mouth daily at 6 PM. 07/15/16  Yes Hilty, Nadean Corwin, MD  diltiazem (CARDIZEM CD) 120 MG 24 hr capsule Take 1 capsule (120 mg total) by mouth 2 (two) times daily. 12/03/16  Yes Hilty, Nadean Corwin, MD  hydrochlorothiazide (HYDRODIURIL) 50 MG tablet Take 1 tablet (50 mg total) by mouth daily. Please schedule appointment for refills. 07/15/16  Yes Hilty, Nadean Corwin, MD  levothyroxine (SYNTHROID, LEVOTHROID) 100 MCG tablet Take 100 mcg by mouth daily before breakfast.   Yes [provider]  metFORMIN (GLUCOPHAGE) 500 MG tablet Take 1,000 mg by mouth 2 (two) times daily with a meal.  04/09/13  Yes [provider]  metoprolol succinate (TOPROL-XL) 50 MG 24 hr tablet Take 1 tablet (50 mg total) by mouth 2 (two) times daily. 07/15/16  Yes Hilty, Nadean Corwin, MD  potassium chloride SA (KLOR-CON M20) 20 MEQ tablet Take 1 tablet (20 mEq total) by mouth daily. 07/15/16  Yes Hilty, Nadean Corwin, MD  warfarin (COUMADIN) 5 MG tablet TAKE 1 & 1/2 TO 2 TABLETS DAILY AS DIRECTED BY COUMADIN CLINIC Patient taking differently: TAKE 1 TABLET (5 MG) BY MOUTH DAILY EXCEPT ON FRI  TAKE 1.5 TABLETS (7.5 MG) 04/04/17  Yes Hilty, Nadean Corwin, MD  furosemide (LASIX) 20 MG tablet Take 1 tablet (20 mg total) by mouth as needed for edema. 12/03/16   Pixie Casino, MD    Physical Exam: Vitals:   06/07/17 1239 06/07/17 1606 06/07/17 1809 06/07/17 2011  BP: 129/73 138/90 (!) 143/77 118/77  Pulse: 73 60 88 88  Resp: 18 15 15 18   Temp: 98.9 F (37.2 C)     TempSrc: Oral     SpO2: 98% 95% 100% 97%      Constitutional: Moderately built and nourished. Vitals:   06/07/17 1239 06/07/17 1606 06/07/17 1809 06/07/17 2011  BP: 129/73 138/90 (!) 143/77 118/77  Pulse: 73 60 88 88  Resp: 18 15 15 18   Temp: 98.9 F (37.2 C)       TempSrc: Oral     SpO2: 98% 95% 100% 97%   Eyes: Anicteric no pallor. ENMT: No discharge from the ears eyes nose and mouth. Neck: No mass felt. No neck rigidity. Respiratory: No rhonchi or crepitations. Cardiovascular: S1-S2 heard no murmurs appreciated. Abdomen: Soft nontender bowel sounds present. Musculoskeletal: Pain on the left initial tuberosity area. Skin: No rash. Neurologic: Alert awake oriented to time place and person. Moves all extremities. Psychiatric: Appears normal. Normal affect.   Labs on Admission: I have personally reviewed following labs and imaging studies  CBC:  Recent Labs Lab 06/07/17 2005  WBC 8.6  NEUTROABS 7.2  HGB 12.6  HCT 37.5  MCV 80.3  PLT 782   Basic Metabolic Panel:  Recent Labs Lab 06/07/17 2039  NA 136  K 3.7  CL 97*  CO2 30  GLUCOSE 156*  BUN 17  CREATININE 0.71  CALCIUM 8.8*   GFR: CrCl cannot be calculated (Unknown ideal weight.). Liver Function Tests: No results for input(s): AST, ALT, ALKPHOS, BILITOT, PROT, ALBUMIN in the last 168 hours. No results for input(s): LIPASE, AMYLASE in the last 168 hours. No results for input(s): AMMONIA in the last 168 hours. Coagulation Profile:  Recent Labs Lab 06/07/17 2005  INR 2.04   Cardiac Enzymes: No results for input(s): CKTOTAL, CKMB, CKMBINDEX, TROPONINI in the last 168 hours. BNP (last 3 results) No results for input(s): PROBNP in the last 8760 hours. HbA1C: No results for input(s): HGBA1C in the last 72 hours. CBG: No results for input(s): GLUCAP in the last 168 hours. Lipid Profile: No results for input(s): CHOL, HDL, LDLCALC, TRIG, CHOLHDL, LDLDIRECT in the last 72 hours. Thyroid Function Tests: No results for input(s): TSH, T4TOTAL, FREET4, T3FREE, THYROIDAB in the last 72 hours. Anemia Panel: No results for input(s): VITAMINB12, FOLATE, FERRITIN, TIBC, IRON, RETICCTPCT in the last 72 hours. Urine analysis:    Component Value Date/Time   COLORURINE YELLOW  06/07/2017 [place] 06/07/2017 1744   LABSPEC 1.011 06/07/2017 1744   PHURINE 6.0 06/07/2017 1744   GLUCOSEU NEGATIVE 06/07/2017 1744   HGBUR SMALL (A) 06/07/2017 1744   BILIRUBINUR NEGATIVE 06/07/2017 1744   KETONESUR NEGATIVE 06/07/2017 1744   PROTEINUR NEGATIVE 06/07/2017 1744   UROBILINOGEN 0.2 03/17/2011 0847   NITRITE NEGATIVE 06/07/2017 1744   LEUKOCYTESUR TRACE (A) 06/07/2017 1744   Sepsis Labs: @LABRCNTIP (procalcitonin:4,lacticidven:4) )No results found for this or any previous visit (from the past 240 hour(s)).   Radiological Exams on Admission: Ct Hip Left Wo Contrast  Result Date: 06/07/2017 CLINICAL DATA:  Left hip pain since 2 a.m. without known injury. EXAM: CT OF THE LEFT HIP WITHOUT CONTRAST  TECHNIQUE: Multidetector CT imaging of the left hip was performed according to the standard protocol. Multiplanar CT image reconstructions were also generated. COMPARISON:  Radiographs from earlier the same day. FINDINGS: Bones/Joint/Cartilage The left hip joint is maintained without joint effusion or fracture. The adjacent pubic rami appear intact. Ligaments Suboptimally assessed by CT. Muscles and Tendons No muscle enlargement or edema to suggest a tear. No intramuscular hemorrhage. Soft tissues Adjacent colonic diverticulosis is noted within the left hemipelvis. The urinary bladder is unremarkable. No soft tissue mass or hematom. Suture material is seen along the ventral aspect of the mid pelvis. IMPRESSION: Intact left hip joint without significant arthropathy, joint effusion or fracture. Electronically Signed   By: Ashley Royalty M.D.   On: 06/07/2017 18:13   Dg Hip Unilat With Pelvis 2-3 Views Left  Result Date: 06/07/2017 CLINICAL DATA:  Lateral left hip pain for 2 days.  No known injury. EXAM: DG HIP (WITH OR WITHOUT PELVIS) 2-3V LEFT COMPARISON:  None. FINDINGS: Negative for acute fracture or dislocation. No evidence of bone lesion or erosion. No degenerative hip  narrowing or spurring. Negative sacroiliac joints. No abnormality noted in the sacrum, although limited due to overlapping stool and bowel gas. Degenerative spurring of lower lumbar endplates. Postoperative changes seen in the anterior pelvic wall. Arterial calcification at the level of the iliacs. IMPRESSION: No acute finding or degenerative hip narrowing. Electronically Signed   By: Monte Fantasia M.D.   On: 06/07/2017 14:16     Assessment/Plan Principal Problem:   Left hip pain Active Problems:   PAF (paroxysmal atrial fibrillation) (HCC)   HTN (hypertension)   Type 2 diabetes mellitus (HCC)   Hypothyroidism    1. Left hip pain - if pain recurs may consult orthopedic surgeon. CT was unremarkable. Suspecting patient may have trochanteric bursitis. Physical therapy consult and pain relief medications. 2. Chronic atrial fibrillation chads 2 vasc score is more than 2. Patient on Coumadin and metoprolol and Cardizem. 3. Hypertension on hydrochlorothiazide and Cardizem and metoprolol. 4. Diabetes mellitus type 2 - will keep patient on sliding scale coverage. 5. Hypothyroidism on Synthroid.   DVT prophylaxis: Coumadin. Code Status: Full code.  Family Communication: Discussed with patient.  Disposition Plan: To be determined.  Consults called: Physical therapy.  Admission status: Observation.    Rise Patience MD [facility] Pager [phone].  If 7PM-7AM, please contact night-coverage [email or web address] Password [facility]  06/07/2017, 10:11 PM

## 2017-06-07 NOTE — ED Notes (Signed)
Attempted to ambulate pt in hall. Pt had one person holding a hand on each side and she was unable to ambulate easily. Pt took approximately 10 steps and the left leg was dragging. Pt was unsteady.  Pt kept saying "That pill made it better. That pill made it better. I want to go home." PA made aware

## 2017-06-07 NOTE — ED Provider Notes (Signed)
[facility] DEPT Provider Note   CSN: [id number] Arrival date & time: 06/07/17  1225     History   Chief Complaint Chief Complaint  Patient presents with  . Hip Pain    HPI [name] is a 82 y.o. female with a PMHx of DM2, HTN, HLD, hypothyroidism, PAF on coumadin, who presents to the ED with complaints of left hip pain that began about one week ago and has worsened since yesterday. She states that 15 years ago she saw Dr. Leonides Schanz at [facility] for her hip pain, was diagnosed with arthritis and underwent 6 months of PT, and subsequently has done great and not had any issues since then. Last week she started having gradual onset pain which worsened yesterday. She admits that she's been doing a lot of gardening recently, doing a lot of kneeling, bending, and heavy lifting, however she denies any trauma or injury to the head. She describes the pain as 5/10 constant aching nonradiating left hip pain that worsens with weightbearing and laying on her left side, and has been improved with heat, muscle rub, and Mobic given to her by her sister. She does not take any medications for pain at home. She is very active, continues to work at the [employer] doing household activities and chores, and does a lot of gardening at her own home.  She denies back pain, bruising/erythema/warmth of the hip, fevers, chills, CP, SOB, abd pain, N/V/D/C, hematuria, dysuria, myalgias, arthralgias, numbness, tingling, focal weakness, or any other complaints at this time. PCP at [facility].    The history is provided by the patient and medical records. No language interpreter was used.  Hip Pain  This is a new problem. The current episode started more than 2 days ago. The problem occurs constantly. The problem has been gradually worsening. Pertinent negatives include no chest pain, no abdominal pain and no shortness of breath. The symptoms are aggravated by walking. The symptoms are relieved by NSAIDs, heat  and rest. She has tried rest and a warm compress (muscle rub, and mobic) for the symptoms. The treatment provided mild relief.    Past Medical History:  Diagnosis Date  . Colon polyp   . Diabetes mellitus   . Hyperlipidemia   . Hypertension   . Poor appetite   . Thyroid disease     Patient Active Problem List   Diagnosis Date Noted  . Hypothyroidism 02/03/2015  . HTN (hypertension) 07/10/2013  . Type 2 diabetes mellitus ([place]) 07/10/2013  . Upper respiratory infection 07/10/2013  . PAF (paroxysmal atrial fibrillation) ([place]) 12/26/2012  . Long term current use of anticoagulant therapy 12/26/2012  . Postop check 04/13/2011    Past Surgical History:  Procedure Laterality Date  . BREAST EXCISIONAL BIOPSY Right   . CARDIOVASCULAR STRESS TEST  07/15/2006   Normal scan, no ECG changes  . TRANSTHORACIC ECHOCARDIOGRAM  01/08/2011   EF 60-65%, moderae mitral regurg, LA mild-moderately dilated,    OB History    No data available       Home Medications    Prior to Admission medications   Medication Sig Start Date End Date Taking? Authorizing Provider  atorvastatin (LIPITOR) 20 MG tablet Take 1 tablet (20 mg total) by mouth daily at 6 PM. 07/15/16   Hilty, Nadean Corwin, MD  diltiazem (CARDIZEM CD) 120 MG 24 hr capsule Take 1 capsule (120 mg total) by mouth 2 (two) times daily. 12/03/16   Hilty, Nadean Corwin, MD  furosemide (LASIX) 20 MG  tablet Take 1 tablet (20 mg total) by mouth as needed for edema. 12/03/16   Hilty, Nadean Corwin, MD  hydrochlorothiazide (HYDRODIURIL) 50 MG tablet Take 1 tablet (50 mg total) by mouth daily. Please schedule appointment for refills. 07/15/16   Hilty, Nadean Corwin, MD  levothyroxine (SYNTHROID, LEVOTHROID) 100 MCG tablet Take 100 mcg by mouth daily before breakfast.    [provider]  metFORMIN (GLUCOPHAGE) 500 MG tablet Take 1,000 mg by mouth 2 (two) times daily with a meal.  04/09/13   [provider]  metoprolol succinate (TOPROL-XL) 50 MG 24 hr  tablet Take 1 tablet (50 mg total) by mouth 2 (two) times daily. 07/15/16   Hilty, Nadean Corwin, MD  potassium chloride SA (KLOR-CON M20) 20 MEQ tablet Take 1 tablet (20 mEq total) by mouth daily. 07/15/16   Pixie Casino, MD  warfarin (COUMADIN) 5 MG tablet TAKE 1 & 1/2 TO 2 TABLETS DAILY AS DIRECTED BY COUMADIN CLINIC 04/04/17   Hilty, Nadean Corwin, MD    Family History Family History  Problem Relation Age of Onset  . Diabetes Mother   . Hypertension Father   . Hyperlipidemia Father   . Diabetes Sister   . Hypertension Sister   . Hyperlipidemia Sister   . Hypertension Brother   . Stroke Sister   . Hypertension Sister   . Diabetes Sister   . Cancer Daughter   . Heart failure Child   . Breast cancer Neg Hx     Social History Social History  Substance Use Topics  . Smoking status: Former Smoker    Types: Cigarettes    Quit date: 05/18/2003  . Smokeless tobacco: Never Used  . Alcohol use No     Allergies   Patient has no known allergies.   Review of Systems Review of Systems  Constitutional: Negative for chills and fever.  Respiratory: Negative for shortness of breath.   Cardiovascular: Negative for chest pain.  Gastrointestinal: Negative for abdominal pain, constipation, diarrhea, nausea and vomiting.  Genitourinary: Negative for dysuria and hematuria.  Musculoskeletal: Positive for arthralgias. Negative for back pain and joint swelling.  Skin: Negative for color change.  Allergic/Immunologic: Positive for immunocompromised state (DM2).  Neurological: Negative for weakness and numbness.  Hematological: Bruises/bleeds easily (on coumadin).  Psychiatric/Behavioral: Negative for confusion.   All other systems reviewed and are negative for acute change except as noted in the HPI.    Physical Exam Updated Vital Signs BP 129/73 (BP Location: Right Arm)   Pulse 73   Temp 98.9 F (37.2 C) (Oral)   Resp 18   SpO2 98%   Physical Exam  Constitutional: She is oriented to  person, place, and time. Vital signs are normal. She appears well-developed and well-nourished.  Non-toxic appearance. No distress.  Afebrile, nontoxic, NAD  HENT:  Head: Normocephalic and atraumatic.  Mouth/Throat: Oropharynx is clear and moist and mucous membranes are normal.  Eyes: Conjunctivae and EOM are normal. Right eye exhibits no discharge. Left eye exhibits no discharge.  Neck: Normal range of motion. Neck supple.  Cardiovascular: Normal rate, regular rhythm, normal heart sounds and intact distal pulses.  Exam reveals no gallop and no friction rub.   No murmur heard. Pulmonary/Chest: Effort normal and breath sounds normal. No respiratory distress. She has no decreased breath sounds. She has no wheezes. She has no rhonchi. She has no rales.  Abdominal: Soft. Normal appearance and bowel sounds are normal. She exhibits no distension. There is no tenderness. There is no  rigidity, no rebound, no guarding, no CVA tenderness, no tenderness at McBurney's point and negative Murphy's sign.  Musculoskeletal:       Left hip: She exhibits decreased range of motion (due to pain), tenderness and bony tenderness. She exhibits normal strength, no swelling, no crepitus and no deformity.       Legs: L hip with mildly limited ROM due to pain, with mild lateral joint line TTP extending towards the glutes posteriorly, and down to the greater trochanteric region laterally; no bruising or swelling, no crepitus or deformity, no limb length discrepancy or abnormal rotation. No pain with log roll testing. Strength and sensation grossly intact, distal pulses intact, compartments soft.   Neurological: She is alert and oriented to person, place, and time. She has normal strength. No sensory deficit.  Skin: Skin is warm, dry and intact. No rash noted.  Psychiatric: She has a normal mood and affect.  Nursing note and vitals reviewed.    ED Treatments / Results  Labs (all labs ordered are listed, but only abnormal  results are displayed) Labs Reviewed  PROTIME-INR - Abnormal; Notable for the following:       Result Value   Prothrombin Time 22.9 (*)    All other components within normal limits  URINALYSIS, ROUTINE W REFLEX MICROSCOPIC - Abnormal; Notable for the following:    Hgb urine dipstick SMALL (*)    Leukocytes, UA TRACE (*)    Squamous Epithelial / LPF 0-5 (*)    All other components within normal limits  BASIC METABOLIC PANEL - Abnormal; Notable for the following:    Chloride 97 (*)    Glucose, Bld 156 (*)    Calcium 8.8 (*)    All other components within normal limits  CBC WITH DIFFERENTIAL/PLATELET    EKG  EKG Interpretation None       Radiology Ct Hip Left Wo Contrast  Result Date: 06/07/2017 CLINICAL DATA:  Left hip pain since 2 a.m. without known injury. EXAM: CT OF THE LEFT HIP WITHOUT CONTRAST TECHNIQUE: Multidetector CT imaging of the left hip was performed according to the standard protocol. Multiplanar CT image reconstructions were also generated. COMPARISON:  Radiographs from earlier the same day. FINDINGS: Bones/Joint/Cartilage The left hip joint is maintained without joint effusion or fracture. The adjacent pubic rami appear intact. Ligaments Suboptimally assessed by CT. Muscles and Tendons No muscle enlargement or edema to suggest a tear. No intramuscular hemorrhage. Soft tissues Adjacent colonic diverticulosis is noted within the left hemipelvis. The urinary bladder is unremarkable. No soft tissue mass or hematom. Suture material is seen along the ventral aspect of the mid pelvis. IMPRESSION: Intact left hip joint without significant arthropathy, joint effusion or fracture. Electronically Signed   By: Ashley Royalty M.D.   On: 06/07/2017 18:13   Dg Hip Unilat With Pelvis 2-3 Views Left  Result Date: 06/07/2017 CLINICAL DATA:  Lateral left hip pain for 2 days.  No known injury. EXAM: DG HIP (WITH OR WITHOUT PELVIS) 2-3V LEFT COMPARISON:  None. FINDINGS: Negative for acute  fracture or dislocation. No evidence of bone lesion or erosion. No degenerative hip narrowing or spurring. Negative sacroiliac joints. No abnormality noted in the sacrum, although limited due to overlapping stool and bowel gas. Degenerative spurring of lower lumbar endplates. Postoperative changes seen in the anterior pelvic wall. Arterial calcification at the level of the iliacs. IMPRESSION: No acute finding or degenerative hip narrowing. Electronically Signed   By: Monte Fantasia M.D.   On: 06/07/2017 14:16  Procedures Procedures (including critical care time)  Medications Ordered in ED Medications  morphine 4 MG/ML injection 4 mg (not administered)  HYDROcodone-acetaminophen (NORCO/VICODIN) 5-325 MG per tablet 1 tablet (1 tablet Oral Given 06/07/17 1459)  HYDROcodone-acetaminophen (NORCO/VICODIN) 5-325 MG per tablet 1 tablet (1 tablet Oral Given 06/07/17 1631)  ondansetron (ZOFRAN) injection 4 mg (4 mg Intravenous Given 06/07/17 1738)  fentaNYL (SUBLIMAZE) injection 50 mcg (50 mcg Intravenous Given 06/07/17 1738)  dexamethasone (DECADRON) injection 10 mg (10 mg Intravenous Given 06/07/17 1827)     Initial Impression / Assessment and Plan / ED Course  I have reviewed the triage vital signs and the nursing notes.  Pertinent labs & imaging results that were available during my care of the patient were reviewed by me and considered in my medical decision making (see chart for details).     82 y.o. female here with L hip pain x1wk worse since yesterday; hx of arthritis in hip 7yrs ago, did PT and was fine until last week. On exam, L hip with mildly limited ROM just due to pain, neg log-roll test, mild TTP to greater trochanteric area and lateral joint line, extending into the glutes, no focal spinal TTP, NVI with soft compartments. Did not ambulate since pt is in hallway and has no pants; will await hip xray, and give pain meds, then reassess. Discussed case with my attending Dr. Ralene Bathe who agrees  with plan.   4:07 PM Xray with mild arthritis of lower lumbar endplates, no significant arthropathy otherwise; could be radicular pain from back, however doubt need for spinal imaging given lack of red flag s/sx for spinal condition, and no focal spinal tenderness, extremities NVI, no evidence of cauda equina. Pt feeling slightly better, however attempted to ambulate and had significant pain and had difficulty with ambulation without significant assistance; given option of trying second narcotic pill vs proceeding with CT (highly doubt occult fx, given no trauma/injury, however that's still on DDx). Pt prefers to try PO pain pill and re-attempt ambulation; if unable, then would have to consider CT of hip to ensure no occult injury. Will reassess shortly  5:24 PM Pt attempted to ambulate but still having difficulty, needs assistance to stand; family concerned because she lives in the basement and kitchen is upstairs, so they don't feel she could be sent home like this; very insistent that she be admitted. At this point, will get CT hip to ensure no occult injury, despite how unlikely it might be; will get basic labs as well. Pt hasn't eaten anything, will give crackers since pain meds are making her nauseated; will give zofran and small dose of fentanyl, and decadron as well. Will reassess shortly  9:41 PM Several delays getting labs done because apparently vials of blood were lost initially, however now have resulted and show: CBC w/diff WNL. BMP without concerning findings. INR 2.04 which is within therapeutic window. U/A with 0-5 squamous, no bacteria, nitrite/leuk neg, likely contaminated, doubt UTI. CT L hip unremarkable without significant arthropathy or occult injury found. Pt stating pain slightly better however attempted to ambulate, required two person assist and walked a few steps but then couldn't continue due to pain, and had to be helped back to her bed. At this point, we've attempted PO and  IV narcotics and steroids (and NSAIDs at home, but not here since she's on coumadin, want to avoid these); given intractable pain and pt unable to ambulate without assistance, will need to admit for ongoing management of her  intractable hip pain, likely PT/OT consultation/evaluation. Discussed case with my current attending Dr. Leonette Monarch who agrees with plan.   10:05 PM Dr. Hal Hope of [facility] returning page and will admit. Holding orders to be placed by admitting team. Please see their notes for further documentation of care. I appreciate their help with this pleasant pt's care. Pt stable at time of admission.   Final Clinical Impressions(s) / ED Diagnoses   Final diagnoses:  Left hip pain  Bursitis of left hip, unspecified bursa  Intractable pain  Osteoarthritis of lumbar spine, unspecified spinal osteoarthritis complication status    New Prescriptions New Prescriptions   No medications on [address], [place], [place] 06/07/17 2205    Quintella Reichert, MD 06/08/17 [phone]

## 2017-06-07 NOTE — ED Notes (Signed)
Lab called and the light green tube needs to be recollected due to hemolyzation

## 2017-06-07 NOTE — ED Notes (Signed)
Pt o2 sat. At 87-90 % on room air. Pt place on 2 lpm of oxygen

## 2017-06-07 NOTE — ED Notes (Signed)
Pt states her pain is coming back PA made aware

## 2017-06-07 NOTE — ED Notes (Signed)
Bed: [facility] Expected date:  Expected time:  Means of arrival:  Comments: 82 yo hip pain, no injury

## 2017-06-07 NOTE — ED Triage Notes (Signed)
Per EMS, pt complains of left hip pain since 2AM today. Pt denies fall. Pain is worse while standing. No shortening or rotation.

## 2017-06-07 NOTE — ED Notes (Signed)
Bed: SF68 Expected date:  Expected time:  Means of arrival:  Comments: Aileen Fass

## 2017-06-07 NOTE — ED Notes (Addendum)
Pt reports about 15 years ago they could see some arthritis in the left hip, so she was sent for PT and has had no problems with the hip since. Pt reports that the past week the hip has been feeling tender with increasing pain the last few days. Pt reports she is unable to put weight on the hip. Pt reports that laying down with no weight on the hip the pain is a 3/10, but when attempting to put weight on it, the pain is a 10/10.

## 2017-06-07 NOTE — ED Notes (Signed)
ED Provider at bedside. 

## 2017-06-07 NOTE — Progress Notes (Signed)
ANTICOAGULATION CONSULT NOTE - Initial Consult  Pharmacy Consult for coumadin Indication: atrial fibrillation  No Known Allergies  Patient Measurements:    Vital Signs: Temp: 98.9 F (37.2 C) (08/28 1239) Temp Source: Oral (08/28 1239) BP: 118/77 (08/28 2011) Pulse Rate: 88 (08/28 2011)  Labs:  Recent Labs  06/07/17 2005 06/07/17 2039  HGB 12.6  --   HCT 37.5  --   PLT 261  --   LABPROT 22.9*  --   INR 2.04  --   CREATININE  --  0.71    CrCl cannot be calculated (Unknown ideal weight.).   Medical History: Past Medical History:  Diagnosis Date  . Colon polyp   . Diabetes mellitus   . Hyperlipidemia   . Hypertension   . Poor appetite   . Thyroid disease     Assessment: [name] on coumadin PTA for afib. Home dose:  5 mg qday x 7.5 on Friday. last dose 8/27 at 1800 Admit INR 2.04 CBC WNL, no bleeding reported.   Goal of Therapy:  INR 2-3 Monitor platelets by anticoagulation protocol: Yes   Plan:  Coumadin 5 mg po x 1 dose tonight Daily INR  Eudelia Bunch, Pharm.D. [phone] 06/07/2017 10:18 PM

## 2017-06-07 NOTE — ED Notes (Signed)
Report given to Phineas Real, RN 6E

## 2017-06-08 DIAGNOSIS — I480 Paroxysmal atrial fibrillation: Secondary | ICD-10-CM

## 2017-06-08 DIAGNOSIS — R52 Pain, unspecified: Secondary | ICD-10-CM | POA: Diagnosis not present

## 2017-06-08 DIAGNOSIS — M7072 Other bursitis of hip, left hip: Secondary | ICD-10-CM

## 2017-06-08 DIAGNOSIS — M25552 Pain in left hip: Secondary | ICD-10-CM | POA: Diagnosis not present

## 2017-06-08 LAB — GLUCOSE, CAPILLARY
GLUCOSE-CAPILLARY: 368 mg/dL — AB (ref 65–99)
GLUCOSE-CAPILLARY: 533 mg/dL — AB (ref 65–99)
GLUCOSE-CAPILLARY: 394 mg/dL — AB (ref 65–99)
Glucose-Capillary: 209 mg/dL — ABNORMAL HIGH (ref 65–99)
Glucose-Capillary: 319 mg/dL — ABNORMAL HIGH (ref 65–99)

## 2017-06-08 LAB — PROTIME-INR
INR: 1.96
PROTHROMBIN TIME: 22.2 s — AB (ref 11.4–15.2)

## 2017-06-08 LAB — BASIC METABOLIC PANEL
ANION GAP: 12 (ref 5–15)
BUN: 25 mg/dL — ABNORMAL HIGH (ref 6–20)
CHLORIDE: 95 mmol/L — AB (ref 101–111)
CO2: 27 mmol/L (ref 22–32)
Calcium: 8.9 mg/dL (ref 8.9–10.3)
Creatinine, Ser: 1.06 mg/dL — ABNORMAL HIGH (ref 0.44–1.00)
GFR calc non Af Amer: 48 mL/min — ABNORMAL LOW (ref >60)
GFR, EST AFRICAN AMERICAN: 56 mL/min — AB (ref >60)
GLUCOSE: 343 mg/dL — AB (ref 65–99)
Potassium: 3 mmol/L — ABNORMAL LOW (ref 3.5–5.1)
Sodium: 134 mmol/L — ABNORMAL LOW (ref 135–145)

## 2017-06-08 LAB — CBC
HEMATOCRIT: 39.6 % (ref 36.0–46.0)
HEMOGLOBIN: 13.1 g/dL (ref 12.0–15.0)
MCH: 26.8 pg (ref 26.0–34.0)
MCHC: 33.1 g/dL (ref 30.0–36.0)
MCV: 81 fL (ref 78.0–100.0)
Platelets: 226 10*3/uL (ref 150–400)
RBC: 4.89 MIL/uL (ref 3.87–5.11)
RDW: 14.7 % (ref 11.5–15.5)
WBC: 8.3 10*3/uL (ref 4.0–10.5)

## 2017-06-08 LAB — GLUCOSE, RANDOM: GLUCOSE: 477 mg/dL — AB (ref 65–99)

## 2017-06-08 MED ORDER — POTASSIUM CHLORIDE CRYS ER 20 MEQ PO TBCR
40.0000 meq | EXTENDED_RELEASE_TABLET | Freq: Two times a day (BID) | ORAL | Status: AC
  Administered 2017-06-08 (×2): 40 meq via ORAL
  Filled 2017-06-08 (×2): qty 2

## 2017-06-08 MED ORDER — WARFARIN SODIUM 5 MG PO TABS
5.0000 mg | ORAL_TABLET | Freq: Once | ORAL | Status: AC
  Administered 2017-06-08: 18:00:00 5 mg via ORAL
  Filled 2017-06-08: qty 1

## 2017-06-08 MED ORDER — TRAMADOL HCL 50 MG PO TABS
50.0000 mg | ORAL_TABLET | Freq: Four times a day (QID) | ORAL | Status: DC | PRN
  Administered 2017-06-08 – 2017-06-09 (×3): 50 mg via ORAL
  Filled 2017-06-08 (×3): qty 1

## 2017-06-08 MED ORDER — INSULIN ASPART 100 UNIT/ML ~~LOC~~ SOLN
0.0000 [IU] | Freq: Every day | SUBCUTANEOUS | Status: DC
  Administered 2017-06-08: 22:00:00 3 [IU] via SUBCUTANEOUS

## 2017-06-08 MED ORDER — FENTANYL CITRATE (PF) 100 MCG/2ML IJ SOLN: 25.0000 ug | INTRAMUSCULAR | Status: DC | PRN

## 2017-06-08 MED ORDER — IBUPROFEN 400 MG PO TABS
400.0000 mg | ORAL_TABLET | Freq: Four times a day (QID) | ORAL | Status: DC | PRN
  Administered 2017-06-08 (×2): 400 mg via ORAL
  Filled 2017-06-08 (×2): qty 1

## 2017-06-08 MED ORDER — INSULIN ASPART 100 UNIT/ML ~~LOC~~ SOLN
0.0000 [IU] | Freq: Three times a day (TID) | SUBCUTANEOUS | Status: DC
Start: 2017-06-09 — End: 2017-06-09
  Administered 2017-06-09: 09:00:00 7 [IU] via SUBCUTANEOUS

## 2017-06-08 MED ORDER — METFORMIN HCL 500 MG PO TABS
1000.0000 mg | ORAL_TABLET | Freq: Two times a day (BID) | ORAL | Status: DC
  Administered 2017-06-08 – 2017-06-09 (×2): 1000 mg via ORAL
  Filled 2017-06-08 (×2): qty 2

## 2017-06-08 MED ORDER — DOCUSATE SODIUM 100 MG PO CAPS
100.0000 mg | ORAL_CAPSULE | Freq: Every day | ORAL | Status: DC
  Administered 2017-06-08 – 2017-06-09 (×2): 100 mg via ORAL
  Filled 2017-06-08 (×2): qty 1

## 2017-06-08 NOTE — Progress Notes (Signed)
   06/08/17 1200  PT Visit Information  Last PT Received On 06/08/17 Pt progressing this pm, incr distance with pain meds on board; pt reports she is "old and sick" and can't leave the hospital;  Assistance Needed +1  History of Present Illness [name] is a 82 y.o. female with history of hypertension, A. fib, diabetes mellitus presents to the ER because of difficulty ambulating due to severe pain in the left hip  Subjective Data  Patient Stated Goal home if pain is better  Precautions  Precautions Fall  Restrictions  Weight Bearing Restrictions No  Pain Assessment  Pain Assessment Faces  Faces Pain Scale 4  Pain Location L hip pain with standing and mobility  Pain Descriptors / Indicators Grimacing;Discomfort  Pain Intervention(s) Limited activity within patient's tolerance;Monitored during session;Premedicated before session;Ice applied  Cognition  Arousal/Alertness Awake/alert  Behavior During Therapy WFL for tasks assessed/performed  Overall Cognitive Status Within Functional Limits for tasks assessed  Bed Mobility  Overal bed mobility Needs Assistance  Bed Mobility Supine to Sit;Sit to Supine  Supine to sit Supervision  Sit to supine Supervision  General bed mobility comments for safety, HOB flat, no use of rails  Transfers  Overall transfer level Needs assistance  Equipment used Rolling walker (2 wheeled)  Transfers Sit to/from Stand  Sit to Stand Supervision  General transfer comment for safety   Ambulation/Gait  Ambulation/Gait assistance Min guard  Ambulation Distance (Feet) 58 Feet  Assistive device Rolling walker (2 wheeled)  Gait Pattern/deviations Step-to pattern;Antalgic;Trunk flexed  General Gait Details cues for initial sequence, supervision for safety  Balance  Standing balance support During functional activity;Bilateral upper extremity supported  Standing balance-Leahy Scale Poor  Standing balance comment reliant on UEs d/t LLE pain  PT - End of  Session  Equipment Utilized During Treatment Gait belt  Activity Tolerance Patient tolerated treatment well  Patient left with family/visitor present;in bed;with call bell/phone within reach  PT - Assessment/Plan  PT Plan Current plan remains appropriate  PT Visit Diagnosis Difficulty in walking, not elsewhere classified (R26.2);Pain  Pain - Right/Left Left  Pain - part of body Hip  PT Frequency (ACUTE ONLY) Min 5X/week  Follow Up Recommendations Home health PT;Outpatient PT;Supervision - Intermittent (vs -depending on transportation)  PT equipment Rolling walker with 5" wheels  AM-PAC PT "6 Clicks" Daily Activity Outcome Measure  Difficulty turning over in bed (including adjusting bedclothes, sheets and blankets)? 3  Difficulty moving from lying on back to sitting on the side of the bed?  3  Difficulty sitting down on and standing up from a chair with arms (e.g., wheelchair, bedside commode, etc,.)? 3  Help needed moving to and from a bed to chair (including a wheelchair)? 3  Help needed walking in hospital room? 3  Help needed climbing 3-5 steps with a railing?  2  6 Click Score 17  Mobility G Code  CK  PT Goal Progression  Progress towards PT goals Progressing toward goals  Acute Rehab PT Goals  PT Goal Formulation With patient  Time For Goal Achievement 06/15/17  Potential to Achieve Goals Good  PT Time Calculation  PT Start Time (ACUTE ONLY) 1226  PT Stop Time (ACUTE ONLY) 1245  PT Time Calculation (min) (ACUTE ONLY) 19 min  PT General Charges  $$ ACUTE PT VISIT 1 Visit  PT Treatments  $Gait Training 8-22 mins

## 2017-06-08 NOTE — Progress Notes (Signed)
PROGRESS NOTE    [name]  TSV:[id number] DOB: November 14, 1934 DOA: 06/07/2017 PCP: Anda Kraft, MD    Brief Narrative:  82 y.o. female with history of hypertension, A. fib, diabetes mellitus presents to the ER because of difficulty ambulating due to severe pain in the left hip. Patient states that 15 years ago she had a similar episode and was told she had arthritis which improved with physical therapy. Patient states over the last 1 week patient has been progressively having increasing pain. 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   Assessment & Plan:   Principal Problem:   Left hip pain Active Problems:   PAF (paroxysmal atrial fibrillation) (HCC)   HTN (hypertension)   Type 2 diabetes mellitus (HCC)   Hypothyroidism   1. Left hip pain - has improved overnight with ice, rest, addition of NSADs. CT personally reviewed with Orthopedic Surgery. No fracture or evidence of acute pathology. Suspicion for acute bursitis secondary to joint overuse, as pt admits to being very active recently and climbing stairs routinely on a daily basis. Have called orthopedic surgery to arrange follow up appointment 2. Chronic atrial fibrillation chads 2 vasc score is more than 2. Patient on Coumadin and metoprolol and Cardizem. Stable at present 3. Hypertension on hydrochlorothiazide and Cardizem and metoprolol. Remains stable 4. Diabetes mellitus type 2 - pt continued on SSI coverage. Poor glycemic control. Suspect related to recent steroid injection. Will resume metformin. Will increases SSI insulin coverage to resistant scale 5. Hypothyroidism on Synthroid. Will continue as tolerated  DVT prophylaxis: Coumadin Code Status: Full Family Communication: Pt in room, family at bedside Disposition Plan: Likely d/c home in 24hrs  Consultants:     Procedures:     Antimicrobials: Anti-infectives    None       Subjective: Reports feeling better, however still  unsteady on feet  Objective: Vitals:   06/07/17 2252 06/07/17 2310 06/08/17 0506 06/08/17 1350  BP: 115/82 (!) 145/94 (!) 147/79 115/72  Pulse: 89 99 (!) 107 67  Resp: 18 18 17 18   Temp: 97.8 F (36.6 C) 97.6 F (36.4 C) 97.9 F (36.6 C) 98.3 F (36.8 C)  TempSrc: Oral Oral Oral Oral  SpO2: 96% 99% 100% 100%  Weight:  70.3 kg (155 lb)    Height:  5\' 2"  (1.575 m)      Intake/Output Summary (Last 24 hours) at 06/08/17 1746 Last data filed at 06/08/17 1351  Gross per 24 hour  Intake              590 ml  Output              450 ml  Net              140 ml   Filed Weights   06/07/17 2310  Weight: 70.3 kg (155 lb)    Examination:  General exam: Appears calm and comfortable  Respiratory system: Clear to auscultation. Respiratory effort normal. Cardiovascular system: S1 & S2 heard, RRR Gastrointestinal system: Abdomen is nondistended, soft and nontender. No organomegaly or masses felt. Normal bowel sounds heard. Central nervous system: Alert and oriented. No focal neurological deficits. Extremities: Symmetric 5 x 5 power. Skin: No rashes, lesions Psychiatry: Judgement and insight appear normal. Mood & affect appropriate.   Data Reviewed: I have personally reviewed following labs and imaging studies  CBC:  Recent Labs Lab 06/07/17 2005 06/08/17 0446  WBC 8.6 8.3  NEUTROABS 7.2  --  HGB 12.6 13.1  HCT 37.5 39.6  MCV 80.3 81.0  PLT 261 867   Basic Metabolic Panel:  Recent Labs Lab 06/07/17 2039 06/08/17 0446  NA 136 134*  K 3.7 3.0*  CL 97* 95*  CO2 30 27  GLUCOSE 156* 343*  BUN 17 25*  CREATININE 0.71 1.06*  CALCIUM 8.8* 8.9   GFR: Estimated Creatinine Clearance: 38.9 mL/min (A) (by C-G formula based on SCr of 1.06 mg/dL (H)). Liver Function Tests: No results for input(s): AST, ALT, ALKPHOS, BILITOT, PROT, ALBUMIN in the last 168 hours. No results for input(s): LIPASE, AMYLASE in the last 168 hours. No results for input(s): AMMONIA in the last 168  hours. Coagulation Profile:  Recent Labs Lab 06/07/17 2005 06/08/17 0446  INR 2.04 1.96   Cardiac Enzymes: No results for input(s): CKTOTAL, CKMB, CKMBINDEX, TROPONINI in the last 168 hours. BNP (last 3 results) No results for input(s): PROBNP in the last 8760 hours. HbA1C: No results for input(s): HGBA1C in the last 72 hours. CBG:  Recent Labs Lab 06/08/17 0716 06/08/17 1217  GLUCAP 319* 368*   Lipid Profile: No results for input(s): CHOL, HDL, LDLCALC, TRIG, CHOLHDL, LDLDIRECT in the last 72 hours. Thyroid Function Tests: No results for input(s): TSH, T4TOTAL, FREET4, T3FREE, THYROIDAB in the last 72 hours. Anemia Panel: No results for input(s): VITAMINB12, FOLATE, FERRITIN, TIBC, IRON, RETICCTPCT in the last 72 hours. Sepsis Labs: No results for input(s): PROCALCITON, LATICACIDVEN in the last 168 hours.  No results found for this or any previous visit (from the past 240 hour(s)).   Radiology Studies: Ct Hip Left Wo Contrast  Result Date: 06/07/2017 CLINICAL DATA:  Left hip pain since 2 a.m. without known injury. EXAM: CT OF THE LEFT HIP WITHOUT CONTRAST TECHNIQUE: Multidetector CT imaging of the left hip was performed according to the standard protocol. Multiplanar CT image reconstructions were also generated. COMPARISON:  Radiographs from earlier the same day. FINDINGS: Bones/Joint/Cartilage The left hip joint is maintained without joint effusion or fracture. The adjacent pubic rami appear intact. Ligaments Suboptimally assessed by CT. Muscles and Tendons No muscle enlargement or edema to suggest a tear. No intramuscular hemorrhage. Soft tissues Adjacent colonic diverticulosis is noted within the left hemipelvis. The urinary bladder is unremarkable. No soft tissue mass or hematom. Suture material is seen along the ventral aspect of the mid pelvis. IMPRESSION: Intact left hip joint without significant arthropathy, joint effusion or fracture. Electronically Signed   By: Ashley Royalty M.D.   On: 06/07/2017 18:13   Dg Hip Unilat With Pelvis 2-3 Views Left  Result Date: 06/07/2017 CLINICAL DATA:  Lateral left hip pain for 2 days.  No known injury. EXAM: DG HIP (WITH OR WITHOUT PELVIS) 2-3V LEFT COMPARISON:  None. FINDINGS: Negative for acute fracture or dislocation. No evidence of bone lesion or erosion. No degenerative hip narrowing or spurring. Negative sacroiliac joints. No abnormality noted in the sacrum, although limited due to overlapping stool and bowel gas. Degenerative spurring of lower lumbar endplates. Postoperative changes seen in the anterior pelvic wall. Arterial calcification at the level of the iliacs. IMPRESSION: No acute finding or degenerative hip narrowing. Electronically Signed   By: Monte Fantasia M.D.   On: 06/07/2017 14:16    Scheduled Meds: . atorvastatin  20 mg Oral q1800  . diltiazem  120 mg Oral BID  . docusate sodium  100 mg Oral Daily  . hydrochlorothiazide  50 mg Oral Daily  . insulin aspart  0-9 Units Subcutaneous TID  WC  . levothyroxine  100 mcg Oral QAC breakfast  . metoprolol succinate  50 mg Oral BID  . potassium chloride  40 mEq Oral BID  . warfarin  5 mg Oral ONCE-1800  . Warfarin - Pharmacist Dosing Inpatient   Does not apply q1800   Continuous Infusions:   LOS: 0 days   Abagale Boulos, Orpah Melter, MD [facility] Pager [phone]  If 7PM-7AM, please contact night-coverage [email or web address] Password [facility] 06/08/2017, 5:46 PM

## 2017-06-08 NOTE — Progress Notes (Signed)
ANTICOAGULATION CONSULT NOTE   Pharmacy Consult for coumadin Indication: atrial fibrillation  No Known Allergies  Patient Measurements: Height: 5\' 2"  (157.5 cm) Weight: 155 lb (70.3 kg) IBW/kg (Calculated) : 50.1  Vital Signs: Temp: 97.9 F (36.6 C) (08/29 0506) Temp Source: Oral (08/29 0506) BP: 147/79 (08/29 0506) Pulse Rate: 107 (08/29 0506)  Labs:  Recent Labs  06/07/17 2005 06/07/17 2039 06/08/17 0446  HGB 12.6  --  13.1  HCT 37.5  --  39.6  PLT 261  --  226  LABPROT 22.9*  --  22.2*  INR 2.04  --  1.96  CREATININE  --  0.71 1.06*    Estimated Creatinine Clearance: 38.9 mL/min (A) (by C-G formula based on SCr of 1.06 mg/dL (H)).   Medical History: Past Medical History:  Diagnosis Date  . Colon polyp   . Diabetes mellitus   . Hyperlipidemia   . Hypertension   . Poor appetite   . Thyroid disease     Assessment: [name] on coumadin PTA for afib. Home dose:  5 mg qday x 7.5 on Friday. last dose 8/27 at 1800   06/08/2017 INR 1.98 H/H WNL, Plts WNL Diet ordered Ibuprofen ordered prn for pain No bleeding reported Scr slightly elevated   Goal of Therapy:  INR 2-3 Monitor platelets by anticoagulation protocol: Yes   Plan:  Coumadin 5 mg po x 1 dose tonight Daily INR  Dolly Rias RPh 06/08/2017, 10:52 AM Pager [phone]

## 2017-06-08 NOTE — Evaluation (Signed)
Physical Therapy Evaluation Patient Details Name: [name] MRN: [id number] DOB: April 19, 1935 Today's Date: 06/08/2017   History of Present Illness  [name] is a 82 y.o. female with history of hypertension, A. fib, diabetes mellitus presents to the ER because of difficulty ambulating due to severe pain in the left hip  Clinical Impression  Pt admitted with above diagnosis. Pt currently with functional limitations due to the deficits listed below (see PT Problem List).  Pt will benefit from skilled PT to increase their independence and safety with mobility to allow discharge to the venue listed below.   Pt limited by pain, has not had all meds despite calling multiple times earlier today (per pt); RN notified; pt is more painful with WBing and mobility/WBing LLE; she will need a RW and would benefit from OPPT at D/C Pt reports she was doing a lot of yard work involving bending, stooping,lifting  Prior to onset of pain--possibly this was precipitating factor to her current issue     Follow Up Recommendations Outpatient PT (if has transportation)    Equipment Recommendations  Rolling walker with 5" wheels    Recommendations for Other Services       Precautions / Restrictions Precautions Precautions: Fall Restrictions Weight Bearing Restrictions: No      Mobility  Bed Mobility Overal bed mobility: Needs Assistance Bed Mobility: Supine to Sit;Sit to Supine     Supine to sit: Min assist Sit to supine: Supervision;Min guard   General bed mobility comments: assist to elevate trunk and repeated x2 d/t incr pain  with sitting  Transfers Overall transfer level: Needs assistance Equipment used: Rolling walker (2 wheeled) Transfers: Sit to/from Stand Sit to Stand: Min assist;Min guard         General transfer comment: cues for hand placement and safety; light assist to rise and stabilize   Ambulation/Gait Ambulation/Gait assistance: Min guard Ambulation Distance (Feet):  20 Feet Assistive device: Rolling walker (2 wheeled) Gait Pattern/deviations: Step-to pattern;Antalgic;Trunk flexed     General Gait Details: multi-modal cues for RW position from self, posture, step length and use of UEs; pt is unable to fully WB on LLE d/t incr pain with activity  Stairs            Wheelchair Mobility    Modified Rankin (Stroke Patients Only)       Balance Overall balance assessment: Needs assistance Sitting-balance support: Feet supported;Single extremity supported Sitting balance-Leahy Scale: Fair Sitting balance - Comments: unable to wt shift d/t pain   Standing balance support: During functional activity;Bilateral upper extremity supported Standing balance-Leahy Scale: Poor Standing balance comment: reliant on UEs d/t LLE pain                             Pertinent Vitals/Pain Pain Assessment: 0-10 Pain Score: 8  Pain Location:  (point tenderness to palpation over L lateral hip and lateral to L4-5) Pain Descriptors / Indicators: Constant Pain Intervention(s): Limited activity within patient's tolerance;Monitored during session;Patient requesting pain meds-RN notified    Home Living Family/patient expects to be discharged to:: Private residence Living Arrangements: Alone           Home Layout: Able to live on main level with bedroom/bathroom;One level (with basement) Home Equipment: Crutches;Cane - single point;Walker - standard Additional Comments: has husband's STANDARD walker which may be to tall; pt reports she can stay in the basement and "eat crackers" until she can go up the stairs  Prior Function Level of Independence: Independent               Hand Dominance        Extremity/Trunk Assessment   Upper Extremity Assessment Upper Extremity Assessment: Overall WFL for tasks assessed    Lower Extremity Assessment Lower Extremity Assessment: LLE deficits/detail LLE Deficits / Details: AROM WFL; hip extension  and abd grossly 3/5, limited by pain,otherwise grossly WFL       Communication   Communication: No difficulties  Cognition Arousal/Alertness: Awake/alert Behavior During Therapy: WFL for tasks assessed/performed Overall Cognitive Status: Within Functional Limits for tasks assessed                                        General Comments      Exercises     Assessment/Plan    PT Assessment Patient needs continued PT services  PT Problem List Decreased balance;Decreased mobility;Decreased knowledge of use of DME;Pain       PT Treatment Interventions DME instruction;Gait training;Functional mobility training;Therapeutic activities;Therapeutic exercise;Patient/family education    PT Goals (Current goals can be found in the Care Plan section)  Acute Rehab PT Goals Patient Stated Goal: home if pain is better PT Goal Formulation: With patient Time For Goal Achievement: 06/15/17 Potential to Achieve Goals: Good    Frequency Min 5X/week   Barriers to discharge        Co-evaluation               AM-PAC PT "6 Clicks" Daily Activity  Outcome Measure Difficulty turning over in bed (including adjusting bedclothes, sheets and blankets)?: Unable Difficulty moving from lying on back to sitting on the side of the bed? : Unable Difficulty sitting down on and standing up from a chair with arms (e.g., wheelchair, bedside commode, etc,.)?: Unable Help needed moving to and from a bed to chair (including a wheelchair)?: A Little Help needed walking in hospital room?: A Little Help needed climbing 3-5 steps with a railing? : A Lot 6 Click Score: 11    End of Session Equipment Utilized During Treatment: Gait belt Activity Tolerance: Patient limited by pain Patient left: with family/visitor present;in bed;with call bell/phone within reach   PT Visit Diagnosis: Difficulty in walking, not elsewhere classified (R26.2);Pain Pain - Right/Left: Left Pain - part of body:  Hip    Time: 4580-9983 PT Time Calculation (min) (ACUTE ONLY): 31 min   Charges:   PT Evaluation $PT Eval Low Complexity: 1 Low PT Treatments $Gait Training: 8-22 mins   PT G Codes:   PT G-Codes **NOT FOR INPATIENT CLASS** Functional Assessment Tool Used: AM-PAC 6 Clicks Basic Mobility;Clinical judgement Functional Limitation: Mobility: Walking and moving around Mobility: Walking and Moving Around Current Status (J8250): At least 20 percent but less than 40 percent impaired, limited or restricted Mobility: Walking and Moving Around Goal Status [phone]): At least 1 percent but less than 20 percent impaired, limited or restricted   Kenyon Ana, PT Pager: [phone] 06/08/2017   [facility] 06/08/2017, 11:31 AM

## 2017-06-08 NOTE — Progress Notes (Signed)
Spoke with daughter and patient at bedside. Reviewed ABN with patient and she refused to sign or make a choice until speaking with the physician. Spoke with physician and he will f/u with the patient.

## 2017-06-08 NOTE — Progress Notes (Signed)
Spoke with patient and daughter at bedside. Discussed plans for d/c, patient states she wants to go home but is not ruling out SNF if necessary. Daughter states they have lots of family support so likely will d/c to home. Patient states she is in lots of pain with mobility, using BSC now as she is having difficulty getting to [place]. Discussed HHPT, provided patient and daughter with [facility] list for choice if needs HHPT.  Awaiting final recommendations from PT.

## 2017-06-08 NOTE — Care Management Obs Status (Signed)
[place] NOTIFICATION   Patient Details  Name: [name] MRN: [id number] Date of Birth: [birth year]   Medicare Observation Status Notification Given:  Yes    Guadalupe Maple, RN 06/08/2017, 11:40 AM

## 2017-06-09 DIAGNOSIS — M25552 Pain in left hip: Secondary | ICD-10-CM | POA: Diagnosis not present

## 2017-06-09 LAB — BASIC METABOLIC PANEL
Anion gap: 12 (ref 5–15)
BUN: 38 mg/dL — ABNORMAL HIGH (ref 6–20)
CALCIUM: 8.9 mg/dL (ref 8.9–10.3)
CO2: 24 mmol/L (ref 22–32)
CREATININE: 1.19 mg/dL — AB (ref 0.44–1.00)
Chloride: 100 mmol/L — ABNORMAL LOW (ref 101–111)
GFR calc Af Amer: 49 mL/min — ABNORMAL LOW (ref >60)
GFR, EST NON AFRICAN AMERICAN: 42 mL/min — AB (ref >60)
Glucose, Bld: 189 mg/dL — ABNORMAL HIGH (ref 65–99)
Potassium: 3.9 mmol/L (ref 3.5–5.1)
SODIUM: 136 mmol/L (ref 135–145)

## 2017-06-09 LAB — PROTIME-INR
INR: 2.97
Prothrombin Time: 30.7 seconds — ABNORMAL HIGH (ref 11.4–15.2)

## 2017-06-09 LAB — GLUCOSE, CAPILLARY: Glucose-Capillary: 209 mg/dL — ABNORMAL HIGH (ref 65–99)

## 2017-06-09 MED ORDER — TRAMADOL HCL 50 MG PO TABS: 50.0000 mg | ORAL_TABLET | Freq: Four times a day (QID) | ORAL | 0 refills | Status: DC | PRN

## 2017-06-09 NOTE — Discharge Summary (Signed)
Physician Discharge Summary  [name] QQI:[id number] DOB: April 16, 1935 DOA: 06/07/2017  PCP: Anda Kraft, MD  Admit date: 06/07/2017 Discharge date: 06/09/2017  Admitted From: Home Disposition:  Home  Recommendations for Outpatient Follow-up:  1. Follow up with PCP in 1-2 weeks 2. Follow up with Orthopedic Surgery as scheduled 3. Follow up with coumadin check as scheduled  [place] Controlled Substance Registry reviewed. No recent narcotics are listed  Home Health:PT  Equipment/Devices:rolling walker    Discharge Condition:Stable CODE STATUS:Full Diet recommendation: Diabtic   Brief/Interim Summary: 82 y.o.femalewith history of hypertension, A. fib, diabetes mellitus presents to the ER because of difficulty ambulating due to severe pain in the left hip. Patient states that 15 years ago she had a similar episode and was told she had arthritis which improved with physical therapy. Patient states over the last 1 week patient has been progressively having increasing pain. 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   1. Left hip pain - has improved overnight with ice, rest, addition of NSADs. CT personally reviewed with Orthopedic Surgery. No fracture or evidence of acute pathology. Suspicion for acute bursitis secondary to joint overuse, as pt admits to being very active recently and climbing stairs routinely on a daily basis. Have called orthopedic surgery to arrange follow up appointment 2. Chronic atrial fibrillation chads 2 vasc score is more than 2. Patient on Coumadin and metoprolol and Cardizem. Stable at present 3. Hypertension on hydrochlorothiazide and Cardizem and metoprolol. Remains stable 4. Diabetes mellitus type 2 - pt continued on SSI coverage. Poor glycemic control. Suspect related to recent steroid injection. Patient to resume metformin.  5. Hypothyroidism on Synthroid. Will continue as tolerated  Discharge Diagnoses:  Principal Problem:    Left hip pain Active Problems:   PAF (paroxysmal atrial fibrillation) (HCC)   HTN (hypertension)   Type 2 diabetes mellitus ([place])   Hypothyroidism    Discharge Instructions   Allergies as of 06/09/2017   No Known Allergies     Medication List    TAKE these medications   atorvastatin 20 MG tablet Commonly known as:  LIPITOR Take 1 tablet (20 mg total) by mouth daily at 6 PM.   diltiazem 120 MG 24 hr capsule Commonly known as:  CARDIZEM CD Take 1 capsule (120 mg total) by mouth 2 (two) times daily.   furosemide 20 MG tablet Commonly known as:  LASIX Take 1 tablet (20 mg total) by mouth as needed for edema.   hydrochlorothiazide 50 MG tablet Commonly known as:  HYDRODIURIL Take 1 tablet (50 mg total) by mouth daily. Please schedule appointment for refills.   levothyroxine 100 MCG tablet Commonly known as:  SYNTHROID, LEVOTHROID Take 100 mcg by mouth daily before breakfast.   metFORMIN 500 MG tablet Commonly known as:  GLUCOPHAGE Take 1,000 mg by mouth 2 (two) times daily with a meal.   metoprolol succinate 50 MG 24 hr tablet Commonly known as:  TOPROL-XL Take 1 tablet (50 mg total) by mouth 2 (two) times daily.   potassium chloride SA 20 MEQ tablet Commonly known as:  KLOR-CON M20 Take 1 tablet (20 mEq total) by mouth daily.   traMADol 50 MG tablet Commonly known as:  ULTRAM Take 1 tablet (50 mg total) by mouth every 6 (six) hours as needed for severe pain.   warfarin 5 MG tablet Commonly known as:  COUMADIN TAKE 1 & 1/2 TO 2 TABLETS DAILY AS DIRECTED BY COUMADIN CLINIC What changed:  See the new instructions.            Discharge Care Instructions        Start     Ordered   06/09/17 0000  traMADol (ULTRAM) 50 MG tablet  Every 6 hours PRN     06/09/17 3818     Follow-up Information    Anda Kraft, MD. Schedule an appointment as soon as possible for a visit in 1 week(s).   Specialty:  Endocrinology Contact information: [address] [place] [address] [phone]        Rod Can, MD Follow up.   Specialty:  Orthopedic Surgery Why:  you will be contacted to schedule an appointment Contact information: [address] [place] [address] [phone]          No Known Allergies  Consultations:  Discussed with Orthopedic Surgery  Procedures/Studies: Ct Hip Left Wo Contrast  Result Date: 06/07/2017 CLINICAL DATA:  Left hip pain since 2 a.m. without known injury. EXAM: CT OF THE LEFT HIP WITHOUT CONTRAST TECHNIQUE: Multidetector CT imaging of the left hip was performed according to the standard protocol. Multiplanar CT image reconstructions were also generated. COMPARISON:  Radiographs from earlier the same day. FINDINGS: Bones/Joint/Cartilage The left hip joint is maintained without joint effusion or fracture. The adjacent pubic rami appear intact. Ligaments Suboptimally assessed by CT. Muscles and Tendons No muscle enlargement or edema to suggest a tear. No intramuscular hemorrhage. Soft tissues Adjacent colonic diverticulosis is noted within the left hemipelvis. The urinary bladder is unremarkable. No soft tissue mass or hematom. Suture material is seen along the ventral aspect of the mid pelvis. IMPRESSION: Intact left hip joint without significant arthropathy, joint effusion or fracture. Electronically Signed   By: Ashley Royalty M.D.   On: 06/07/2017 18:13   Mm Screening Breast Tomo Bilateral  Result Date: 05/18/2017 CLINICAL DATA:  Screening. EXAM: 2D DIGITAL SCREENING BILATERAL MAMMOGRAM WITH CAD AND ADJUNCT TOMO COMPARISON:  Previous exam(s). ACR Breast Density Category b: There are scattered areas of fibroglandular density. FINDINGS: There are no findings suspicious for malignancy. Images were processed with CAD. IMPRESSION: No mammographic evidence of malignancy. A result letter of this screening mammogram will be mailed directly to the patient. RECOMMENDATION:  Screening mammogram in one year. (Code:[id number]-01Y) BI-RADS CATEGORY  1: Negative. Electronically Signed   By: Ammie Ferrier M.D.   On: 05/18/2017 11:27   Dg Hip Unilat With Pelvis 2-3 Views Left  Result Date: 06/07/2017 CLINICAL DATA:  Lateral left hip pain for 2 days.  No known injury. EXAM: DG HIP (WITH OR WITHOUT PELVIS) 2-3V LEFT COMPARISON:  None. FINDINGS: Negative for acute fracture or dislocation. No evidence of bone lesion or erosion. No degenerative hip narrowing or spurring. Negative sacroiliac joints. No abnormality noted in the sacrum, although limited due to overlapping stool and bowel gas. Degenerative spurring of lower lumbar endplates. Postoperative changes seen in the anterior pelvic wall. Arterial calcification at the level of the iliacs. IMPRESSION: No acute finding or degenerative hip narrowing. Electronically Signed   By: Monte Fantasia M.D.   On: 06/07/2017 14:16     Subjective: Reports continued hip discomfort, somewhat improved  Discharge Exam: Vitals:   06/08/17 2058 06/09/17 0458  BP: 122/68 102/64  Pulse: 88 61  Resp: 18 17  Temp: 98.5 F (36.9 C) 97.6 F (36.4 C)  SpO2: 96% 98%   Vitals:   06/08/17 0506 06/08/17 1350 06/08/17 2058 06/09/17 0458  BP: (!) 147/79  115/72 122/68 102/64  Pulse: (!) 107 67 88 61  Resp: 17 18 18 17   Temp: 97.9 F (36.6 C) 98.3 F (36.8 C) 98.5 F (36.9 C) 97.6 F (36.4 C)  TempSrc: Oral Oral Oral Oral  SpO2: 100% 100% 96% 98%  Weight:      Height:        General: Pt is alert, awake, not in acute distress Cardiovascular: RRR, S1/S2 +, no rubs, no gallops Respiratory: CTA bilaterally, no wheezing, no rhonchi Abdominal: Soft, NT, ND, bowel sounds + Extremities: no edema, no cyanosis   The results of significant diagnostics from this hospitalization (including imaging, microbiology, ancillary and laboratory) are listed below for reference.     Microbiology: No results found for this or any previous visit (from the  past 240 hour(s)).   Labs: BNP (last 3 results) No results for input(s): BNP in the last 8760 hours. Basic Metabolic Panel:  Recent Labs Lab 06/07/17 2039 06/08/17 0446 06/08/17 1913 06/09/17 0528  NA 136 134*  --  136  K 3.7 3.0*  --  3.9  CL 97* 95*  --  100*  CO2 30 27  --  24  GLUCOSE 156* 343* 477* 189*  BUN 17 25*  --  38*  CREATININE 0.71 1.06*  --  1.19*  CALCIUM 8.8* 8.9  --  8.9   Liver Function Tests: No results for input(s): AST, ALT, ALKPHOS, BILITOT, PROT, ALBUMIN in the last 168 hours. No results for input(s): LIPASE, AMYLASE in the last 168 hours. No results for input(s): AMMONIA in the last 168 hours. CBC:  Recent Labs Lab 06/07/17 2005 06/08/17 0446  WBC 8.6 8.3  NEUTROABS 7.2  --   HGB 12.6 13.1  HCT 37.5 39.6  MCV 80.3 81.0  PLT 261 226   Cardiac Enzymes: No results for input(s): CKTOTAL, CKMB, CKMBINDEX, TROPONINI in the last 168 hours. BNP: Invalid input(s): POCBNP CBG:  Recent Labs Lab 06/08/17 1217 06/08/17 1748 06/08/17 2057 06/08/17 2338 06/09/17 0721  GLUCAP 368* 533* 394* 209* 209*   D-Dimer No results for input(s): DDIMER in the last 72 hours. Hgb A1c No results for input(s): HGBA1C in the last 72 hours. Lipid Profile No results for input(s): CHOL, HDL, LDLCALC, TRIG, CHOLHDL, LDLDIRECT in the last 72 hours. Thyroid function studies No results for input(s): TSH, T4TOTAL, T3FREE, THYROIDAB in the last 72 hours.  Invalid input(s): FREET3 Anemia work up No results for input(s): VITAMINB12, FOLATE, FERRITIN, TIBC, IRON, RETICCTPCT in the last 72 hours. Urinalysis    Component Value Date/Time   COLORURINE YELLOW 06/07/2017 [place] 06/07/2017 1744   LABSPEC 1.011 06/07/2017 1744   PHURINE 6.0 06/07/2017 1744   GLUCOSEU NEGATIVE 06/07/2017 1744   HGBUR SMALL (A) 06/07/2017 1744   BILIRUBINUR NEGATIVE 06/07/2017 1744   KETONESUR NEGATIVE 06/07/2017 1744   PROTEINUR NEGATIVE 06/07/2017 1744    UROBILINOGEN 0.2 03/17/2011 0847   NITRITE NEGATIVE 06/07/2017 1744   LEUKOCYTESUR TRACE (A) 06/07/2017 1744   Sepsis Labs Invalid input(s): PROCALCITONIN,  WBC,  LACTICIDVEN Microbiology No results found for this or any previous visit (from the past 240 hour(s)).   SIGNED:   Donne Hazel, MD  [facility] 06/09/2017, 9:26 AM  If 7PM-7AM, please contact night-coverage [email or web address] Password [email or web address]

## 2017-06-09 NOTE — Progress Notes (Signed)
Physical Therapy Treatment Patient Details Name: [name] MRN: [id number] DOB: 10-Apr-1935 Today's Date: 06/09/2017    History of Present Illness [name] is a 82 y.o. female with history of hypertension, A. fib, diabetes mellitus presents to the ER because of difficulty ambulating due to severe pain in the left hip    PT Comments    Pt  Appears improved clinically today, she is amb further and requires less time to complete bed mobility--however she continues to state she is in severe pain; instructed on activity level  and use of ice at home, all concerns addressed  Follow Up Recommendations  Home health PT;Supervision - Intermittent     Equipment Recommendations  Rolling walker with 5" wheels    Recommendations for Other Services       Precautions / Restrictions Precautions Precautions: Fall Restrictions Weight Bearing Restrictions: No    Mobility  Bed Mobility   Bed Mobility: Supine to Sit;Sit to Supine     Supine to sit: Supervision Sit to supine: Supervision   General bed mobility comments: for safety, HOB flat, no use of rails  Transfers Overall transfer level: Needs assistance Equipment used: Rolling walker (2 wheeled) Transfers: Sit to/from Stand Sit to Stand: Supervision         General transfer comment: for safety   Ambulation/Gait Ambulation/Gait assistance: Supervision;Min guard Ambulation Distance (Feet): 66 Feet Assistive device: Rolling walker (2 wheeled) Gait Pattern/deviations: Step-to pattern;Antalgic;Trunk flexed     General Gait Details: cues for initial sequence, supervision for safety; able to incr WBing LL E   Stairs            Wheelchair Mobility    Modified Rankin (Stroke Patients Only)       Balance             Standing balance-Leahy Scale: Fair                              Cognition Arousal/Alertness: Awake/alert Behavior During Therapy: WFL for tasks assessed/performed Overall  Cognitive Status: Within Functional Limits for tasks assessed                                        Exercises      General Comments        Pertinent Vitals/Pain Pain Assessment: Faces Faces Pain Scale: Hurts little more Pain Location: L hip pain with standing and mobility Pain Descriptors / Indicators: Grimacing;Discomfort Pain Intervention(s): Limited activity within patient's tolerance;Monitored during session;Premedicated before session;Ice applied    Home Living                      Prior Function            PT Goals (current goals can now be found in the care plan section) Acute Rehab PT Goals Patient Stated Goal: home if pain is better PT Goal Formulation: With patient Time For Goal Achievement: 06/15/17 Potential to Achieve Goals: Good Progress towards PT goals: Progressing toward goals    Frequency    Min 5X/week      PT Plan Current plan remains appropriate    Co-evaluation              AM-PAC PT "6 Clicks" Daily Activity  Outcome Measure  Difficulty turning over in bed (including adjusting bedclothes, sheets and blankets)?: A Little  Difficulty moving from lying on back to sitting on the side of the bed? : A Little Difficulty sitting down on and standing up from a chair with arms (e.g., wheelchair, bedside commode, etc,.)?: A Little Help needed moving to and from a bed to chair (including a wheelchair)?: A Little Help needed walking in hospital room?: A Little Help needed climbing 3-5 steps with a railing? : A Lot 6 Click Score: 17    End of Session Equipment Utilized During Treatment: Gait belt Activity Tolerance: Patient tolerated treatment well Patient left: in bed;with call bell/phone within reach;with family/visitor present   PT Visit Diagnosis: Difficulty in walking, not elsewhere classified (R26.2);Pain Pain - Right/Left: Left Pain - part of body: Hip     Time: 2585-2778 PT Time Calculation (min) (ACUTE  ONLY): 35 min  Charges:  $Gait Training: 8-22 mins                    G CodesKenyon Ana, PT Pager: [phone] 06/09/2017    Kenyon Ana 06/09/2017, 11:42 AM

## 2017-06-09 NOTE — Progress Notes (Signed)
Discharge planning, spoke with patient and daughter at beside. Chose [facility] for HH services, contacted [facility] for referral. Needs RW, contacted [facility] to deliver to room. [phone] 

## 2017-07-06 ENCOUNTER — Other Ambulatory Visit: Payer: Self-pay

## 2017-07-06 MED ORDER — METOPROLOL SUCCINATE ER 50 MG PO TB24: 50.0000 mg | ORAL_TABLET | Freq: Two times a day (BID) | ORAL | 10 refills | Status: DC

## 2017-07-08 ENCOUNTER — Other Ambulatory Visit: Payer: Self-pay | Admitting: *Deleted

## 2017-07-13 ENCOUNTER — Other Ambulatory Visit: Payer: Self-pay

## 2017-07-13 MED ORDER — ATORVASTATIN CALCIUM 20 MG PO TABS: 20.0000 mg | ORAL_TABLET | Freq: Every day | ORAL | 1 refills | Status: DC

## 2017-07-13 MED ORDER — DILTIAZEM HCL ER COATED BEADS 120 MG PO CP24: 120.0000 mg | ORAL_CAPSULE | Freq: Two times a day (BID) | ORAL | 0 refills | Status: DC

## 2017-07-15 ENCOUNTER — Other Ambulatory Visit: Payer: Self-pay

## 2017-07-15 MED ORDER — ATORVASTATIN CALCIUM 20 MG PO TABS: 20.0000 mg | ORAL_TABLET | Freq: Every day | ORAL | 2 refills | Status: DC

## 2017-07-15 MED ORDER — DILTIAZEM HCL ER COATED BEADS 120 MG PO CP24: 120.0000 mg | ORAL_CAPSULE | Freq: Two times a day (BID) | ORAL | 2 refills | Status: DC

## 2017-08-07 ENCOUNTER — Other Ambulatory Visit: Payer: Self-pay | Admitting: Internal Medicine

## 2017-08-08 NOTE — Telephone Encounter (Signed)
REFILL 

## 2017-08-15 ENCOUNTER — Encounter (HOSPITAL_COMMUNITY): Payer: Self-pay | Admitting: Emergency Medicine

## 2017-08-15 ENCOUNTER — Emergency Department (HOSPITAL_COMMUNITY)
Admission: EM | Admit: 2017-08-15 | Discharge: 2017-08-15 | Disposition: A | Discharge status: Home / Self Care | Payer: Medicare Other | Attending: Emergency Medicine | Admitting: Emergency Medicine

## 2017-08-15 DIAGNOSIS — E119 Type 2 diabetes mellitus without complications: Secondary | ICD-10-CM | POA: Insufficient documentation

## 2017-08-15 DIAGNOSIS — Z87891 Personal history of nicotine dependence: Secondary | ICD-10-CM | POA: Insufficient documentation

## 2017-08-15 DIAGNOSIS — Z7901 Long term (current) use of anticoagulants: Secondary | ICD-10-CM | POA: Diagnosis not present

## 2017-08-15 DIAGNOSIS — I10 Essential (primary) hypertension: Secondary | ICD-10-CM | POA: Insufficient documentation

## 2017-08-15 DIAGNOSIS — M25552 Pain in left hip: Secondary | ICD-10-CM | POA: Diagnosis present

## 2017-08-15 DIAGNOSIS — E039 Hypothyroidism, unspecified: Secondary | ICD-10-CM | POA: Insufficient documentation

## 2017-08-15 DIAGNOSIS — Z7984 Long term (current) use of oral hypoglycemic drugs: Secondary | ICD-10-CM | POA: Insufficient documentation

## 2017-08-15 MED ORDER — OXYCODONE-ACETAMINOPHEN 5-325 MG PO TABS: 1.0000 | ORAL_TABLET | ORAL | 0 refills | Status: DC | PRN

## 2017-08-15 MED ORDER — DEXAMETHASONE 4 MG PO TABS
8.0000 mg | ORAL_TABLET | Freq: Once | ORAL | Status: AC
  Administered 2017-08-15: 8 mg via ORAL
  Filled 2017-08-15: qty 2

## 2017-08-15 MED ORDER — OXYCODONE-ACETAMINOPHEN 5-325 MG PO TABS
ORAL_TABLET | ORAL | Status: AC
  Filled 2017-08-15: qty 1

## 2017-08-15 MED ORDER — HYDROMORPHONE HCL 1 MG/ML IJ SOLN
0.6000 mg | Freq: Once | INTRAMUSCULAR | Status: AC
  Administered 2017-08-15: 0.6 mg via INTRAMUSCULAR
  Filled 2017-08-15: qty 1

## 2017-08-15 MED ORDER — OXYCODONE-ACETAMINOPHEN 5-325 MG PO TABS
1.0000 | ORAL_TABLET | ORAL | Status: DC | PRN
  Administered 2017-08-15: 1 via ORAL

## 2017-08-15 NOTE — ED Triage Notes (Signed)
Pt states she has increase left leg pain, she was seen on [facility] a week ago and sent home with PT that she is taking and helping some, pt was encouraged to take Ibuprofen for pain that is not helping, pt will like to have something stronger for pain prescribed.

## 2017-08-15 NOTE — ED Provider Notes (Signed)
[facility] EMERGENCY DEPARTMENT Provider Note   CSN: [id number] Arrival date & time: 08/15/17  1757     History   Chief Complaint Chief Complaint  Patient presents with  . Leg Pain    HPI [name] is a 82 y.o. female.  HPI  82 year old female with leg pain.  Left lower extremity.  Chronic.  Worsening in the past week.  Denies acute trauma.  No swelling.  Requesting pain medication.  Past Medical History:  Diagnosis Date  . Colon polyp   . Diabetes mellitus   . Hyperlipidemia   . Hypertension   . Poor appetite   . Thyroid disease     Patient Active Problem List   Diagnosis Date Noted  . Left hip pain 06/07/2017  . Hypothyroidism 02/03/2015  . HTN (hypertension) 07/10/2013  . Type 2 diabetes mellitus ([place]) 07/10/2013  . Upper respiratory infection 07/10/2013  . PAF (paroxysmal atrial fibrillation) ([place]) 12/26/2012  . Long term current use of anticoagulant therapy 12/26/2012  . Postop check 04/13/2011    Past Surgical History:  Procedure Laterality Date  . BREAST EXCISIONAL BIOPSY Right   . CARDIOVASCULAR STRESS TEST  07/15/2006   Normal scan, no ECG changes  . TRANSTHORACIC ECHOCARDIOGRAM  01/08/2011   EF 60-65%, moderae mitral regurg, LA mild-moderately dilated,    OB History    No data available       Home Medications    Prior to Admission medications   Medication Sig Start Date End Date Taking? Authorizing Provider  atorvastatin (LIPITOR) 20 MG tablet Take 1 tablet (20 mg total) by mouth daily at 6 PM. 07/15/17   Hilty, Nadean Corwin, MD  diltiazem (CARDIZEM CD) 120 MG 24 hr capsule Take 1 capsule (120 mg total) by mouth 2 (two) times daily. NEED OV. 08/08/17   Pixie Casino, MD  furosemide (LASIX) 20 MG tablet Take 1 tablet (20 mg total) by mouth as needed for edema. 12/03/16   Hilty, Nadean Corwin, MD  hydrochlorothiazide (HYDRODIURIL) 50 MG tablet Take 1 tablet (50 mg total) by mouth daily. Please schedule appointment for refills.  07/15/16   Hilty, Nadean Corwin, MD  levothyroxine (SYNTHROID, LEVOTHROID) 100 MCG tablet Take 100 mcg by mouth daily before breakfast.    [provider]  metFORMIN (GLUCOPHAGE) 500 MG tablet Take 1,000 mg by mouth 2 (two) times daily with a meal.  04/09/13   [provider]  metoprolol succinate (TOPROL-XL) 50 MG 24 hr tablet Take 1 tablet (50 mg total) by mouth 2 (two) times daily. 07/06/17   Hilty, Nadean Corwin, MD  potassium chloride SA (KLOR-CON M20) 20 MEQ tablet Take 1 tablet (20 mEq total) by mouth daily. 07/15/16   Hilty, Nadean Corwin, MD  traMADol (ULTRAM) 50 MG tablet Take 1 tablet (50 mg total) by mouth every 6 (six) hours as needed for severe pain. 06/09/17   Donne Hazel, MD  warfarin (COUMADIN) 5 MG tablet TAKE 1 & 1/2 TO 2 TABLETS DAILY AS DIRECTED BY COUMADIN CLINIC Patient taking differently: TAKE 1 TABLET (5 MG) BY MOUTH DAILY EXCEPT ON FRI TAKE 1.5 TABLETS (7.5 MG) 04/04/17   Hilty, Nadean Corwin, MD    Family History Family History  Problem Relation Age of Onset  . Diabetes Mother   . Hypertension Father   . Hyperlipidemia Father   . Diabetes Sister   . Hypertension Sister   . Hyperlipidemia Sister   . Hypertension Brother   . Stroke Sister   .  Hypertension Sister   . Diabetes Sister   . Cancer Daughter   . Heart failure Child   . Breast cancer Neg Hx     Social History Social History   Tobacco Use  . Smoking status: Former Smoker    Types: Cigarettes    Last attempt to quit: 05/18/2003    Years since quitting: 14.2  . Smokeless tobacco: Never Used  Substance Use Topics  . Alcohol use: No  . Drug use: No     Allergies   Patient has no known allergies.   Review of Systems Review of Systems   Physical Exam Updated Vital Signs BP 110/87 (BP Location: Left Arm)   Pulse (!) 109   Temp 98.2 F (36.8 C) (Oral)   Resp 17   Ht 5' 1.5" (1.562 m)   Wt 73.5 kg (162 lb)   SpO2 97%   BMI 30.11 kg/m   Physical Exam  Constitutional: She appears  well-developed and well-nourished. No distress.  HENT:  Head: Normocephalic and atraumatic.  Eyes: Conjunctivae are normal. Right eye exhibits no discharge. Left eye exhibits no discharge.  Neck: Neck supple.  Cardiovascular: Normal rate, regular rhythm and normal heart sounds. Exam reveals no gallop and no friction rub.  No murmur heard. Pulmonary/Chest: Effort normal and breath sounds normal. No respiratory distress.  Abdominal: Soft. She exhibits no distension. There is no tenderness.  Musculoskeletal: She exhibits no edema or tenderness.  Left lower extremity is grossly normal in appearance and symmetric as compared to the left.  Neurovascularly intact.  Increased pain with range of motion of the left hip. Can bear weight.  Neurological: She is alert.  Skin: Skin is warm and dry.  Psychiatric: She has a normal mood and affect. Her behavior is normal. Thought content normal.  Nursing note and vitals reviewed.    ED Treatments / Results  Labs (all labs ordered are listed, but only abnormal results are displayed) Labs Reviewed - No data to display  EKG  EKG Interpretation None       Radiology No results found.  Procedures Procedures (including critical care time)  Medications Ordered in ED Medications  oxyCODONE-acetaminophen (PERCOCET/ROXICET) 5-325 MG per tablet 1 tablet (1 tablet Oral Given 08/15/17 1916)  oxyCODONE-acetaminophen (PERCOCET/ROXICET) 5-325 MG per tablet (not administered)     Initial Impression / Assessment and Plan / ED Course  I have reviewed the triage vital signs and the nursing notes.  Pertinent labs & imaging results that were available during my care of the patient were reviewed by me and considered in my medical decision making (see chart for details).      Final Clinical Impressions(s) / ED Diagnoses   Final diagnoses:  Left hip pain    ED Discharge Orders    None       Virgel Manifold, MD 08/23/17 1203

## 2017-08-18 ENCOUNTER — Ambulatory Visit (INDEPENDENT_AMBULATORY_CARE_PROVIDER_SITE_OTHER): Payer: Medicare Other | Admitting: Pharmacist Clinician (PhC)/ Clinical Pharmacy Specialist

## 2017-08-18 ENCOUNTER — Encounter: Payer: Self-pay | Admitting: Internal Medicine

## 2017-08-18 ENCOUNTER — Ambulatory Visit (INDEPENDENT_AMBULATORY_CARE_PROVIDER_SITE_OTHER): Payer: Medicare Other | Admitting: Internal Medicine

## 2017-08-18 VITALS — BP 123/86 | HR 123 | Ht 61.5 in | Wt 162.6 lb

## 2017-08-18 DIAGNOSIS — I480 Paroxysmal atrial fibrillation: Secondary | ICD-10-CM

## 2017-08-18 DIAGNOSIS — Z7901 Long term (current) use of anticoagulants: Secondary | ICD-10-CM | POA: Diagnosis not present

## 2017-08-18 DIAGNOSIS — I10 Essential (primary) hypertension: Secondary | ICD-10-CM

## 2017-08-18 DIAGNOSIS — I4891 Unspecified atrial fibrillation: Secondary | ICD-10-CM | POA: Diagnosis not present

## 2017-08-18 LAB — POCT INR: INR: 1.1

## 2017-08-18 MED ORDER — DILTIAZEM HCL ER COATED BEADS 180 MG PO CP24: 180.0000 mg | ORAL_CAPSULE | Freq: Two times a day (BID) | ORAL | 3 refills | Status: DC

## 2017-08-18 NOTE — Patient Instructions (Addendum)
Dr. Debara Pickett recommends that you have WEEKLY INR (coumadin) checks for the next month  Your physician has recommended you make the following change in your medication:  -- INCREASE diltiazem to 180mg  twice daily  Your physician recommends that you schedule a follow-up appointment in: [place] with Dr. Debara Pickett

## 2017-08-18 NOTE — Progress Notes (Signed)
OFFICE NOTE  Chief Complaint:  Routine follow-up  Primary Care Physician: Deland Pretty, MD  HPI:  [name] is a 82 year old overweight African American female with a history of paroxysmal atrial fibrillation on Coumadin, hypothyroidism, dyslipidemia, diabetes mellitus type 2, hypertension.  She had an episode of PAF in 2014, but this went away fairly quickly and she is not been symptomatic since then. This may be related to her not taking her medications as scheduled. Since then she's been taking her metoprolol twice daily and has had no real problems. Her warfarin level remains therapeutic and well controlled. Over the past several months she's had a problem with breakthrough paroxysmal atrial fibrillation.  In fact at this point she seems to be persistent.  She saw 2 other physician assistants in our office to were able to get her rate controlled. Once she was at a normal ventricular rate, she felt fairly good. In fact she cannot really tell difference when she is in A. fib versus when she is in sinus rhythm.  Previously her episode was possibly related to thyroid and balance, however her thyroid levels have been well controlled as has her diabetes.   [name] returns today for follow-up. Her EKG shows sinus rhythm. It is notable that she is bradycardic today with heart rate in the upper 40s. INR was checked in the office today and is low at 1.7. This will be adjusted by Erasmo Downer, our anticoagulation pharmacist. She denies any complaints such as shortness of breath, increasing fatigue, presyncope or syncopal symptoms. She also denies any chest pain or palpitations and is unaware of any recurrent atrial fibrillation. She's recently been started on an injectable insulin in addition to her oral Glucophage for diabetes. Her cholesterol is also managed by her primary care provider.  07/15/2016  [name] returns today for follow-up. She's been followed monthly for her INRs which been  therapeutic. She reports feeling well denies any chest pain or recurrent atrial fibrillation. EKG shows sinus rhythm. Overall she feels that she is doing well. Blood prssure was mildly elevated today recently was 122/60 in the office.  08/18/2017  [name] returns today for follow-up.  She was last seen about a year ago.  Overall she seems to be doing okay but she is slowing down.  She says she cannot work quite as much as she could previously.  Although she has a history of atrial fibrillation, recently she has had no recurrence.  Today however she is noted to be in A. fib with RVR and heart rate of 123.  He denies any chest pain or worsening shortness of breath with this.  PMHx:  Past Medical History:  Diagnosis Date  . Colon polyp   . Diabetes mellitus   . Hyperlipidemia   . Hypertension   . Poor appetite   . Thyroid disease     Past Surgical History:  Procedure Laterality Date  . BREAST EXCISIONAL BIOPSY Right   . CARDIOVASCULAR STRESS TEST  07/15/2006   Normal scan, no ECG changes  . TRANSTHORACIC ECHOCARDIOGRAM  01/08/2011   EF 60-65%, moderae mitral regurg, LA mild-moderately dilated,    FAMHx:  Family History  Problem Relation Age of Onset  . Diabetes Mother   . Hypertension Father   . Hyperlipidemia Father   . Diabetes Sister   . Hypertension Sister   . Hyperlipidemia Sister   . Hypertension Brother   . Stroke Sister   . Hypertension Sister   . Diabetes Sister   .  Cancer Daughter   . Heart failure Child   . Breast cancer Neg Hx     SOCHx:   reports that she quit smoking about 14 years ago. Her smoking use included cigarettes. she has never used smokeless tobacco. She reports that she does not drink alcohol or use drugs.  ALLERGIES:  No Known Allergies  ROS: Pertinent items noted in HPI and remainder of comprehensive ROS otherwise negative.  HOME MEDS: Current Outpatient Medications  Medication Sig Dispense Refill  . atorvastatin (LIPITOR) 20 MG tablet  Take 1 tablet (20 mg total) by mouth daily at 6 PM. 30 tablet 2  . diltiazem (CARDIZEM CD) 120 MG 24 hr capsule Take 1 capsule (120 mg total) by mouth 2 (two) times daily. NEED OV. 60 capsule 0  . furosemide (LASIX) 20 MG tablet Take 1 tablet (20 mg total) by mouth as needed for edema. 90 tablet 0  . hydrochlorothiazide (HYDRODIURIL) 50 MG tablet Take 1 tablet (50 mg total) by mouth daily. Please schedule appointment for refills. 30 tablet 10  . levothyroxine (SYNTHROID, LEVOTHROID) 100 MCG tablet Take 100 mcg by mouth daily before breakfast.    . metFORMIN (GLUCOPHAGE) 500 MG tablet Take 1,000 mg by mouth 2 (two) times daily with a meal.     . metoprolol succinate (TOPROL-XL) 50 MG 24 hr tablet Take 1 tablet (50 mg total) by mouth 2 (two) times daily. 60 tablet 10  . oxyCODONE-acetaminophen (PERCOCET/ROXICET) 5-325 MG tablet Take 1-2 tablets every 4 (four) hours as needed by mouth for severe pain. 15 tablet 0  . potassium chloride SA (KLOR-CON M20) 20 MEQ tablet Take 1 tablet (20 mEq total) by mouth daily. 30 tablet 10  . traMADol (ULTRAM) 50 MG tablet Take 1 tablet (50 mg total) by mouth every 6 (six) hours as needed for severe pain. 20 tablet 0  . warfarin (COUMADIN) 5 MG tablet TAKE 1 & 1/2 TO 2 TABLETS DAILY AS DIRECTED BY COUMADIN CLINIC (Patient taking differently: TAKE 1 TABLET (5 MG) BY MOUTH DAILY EXCEPT ON FRI TAKE 1.5 TABLETS (7.5 MG)) 50 tablet 3   No current facility-administered medications for this visit.     LABS/IMAGING: No results found for this or any previous visit (from the past 48 hour(s)). No results found.  VITALS: BP 123/86   Pulse (!) 123   Ht 5' 1.5" (1.562 m)   Wt 162 lb 9.6 oz (73.8 kg)   BMI 30.23 kg/m   EXAM: General appearance: alert and no distress Neck: no carotid bruit, no JVD and thyroid not enlarged, symmetric, no tenderness/mass/nodules Lungs: clear to auscultation bilaterally Heart: irregularly irregular rhythm and Tachycardic Abdomen: soft,  non-tender; bowel sounds normal; no masses,  no organomegaly Extremities: extremities normal, atraumatic, no cyanosis or edema Pulses: 2+ and symmetric Skin: Skin color, texture, turgor normal. No rashes or lesions Neurologic: Grossly normal Psych: Pleasant  EKG: A. fib with RVR at 123-personally reviewed  ASSESSMENT: 1. A. fib with RVR- CHADSVASC score of 4 on warfarin 2. Hypertension - at goal 3. Diabetes type 2 - at goal 4. Hypothyroidism - controlled 5. Dyslipidemia - at goal  PLAN: 1.   Mrs. Florendo is now back in A. fib with RVR.  I suspect some of her recent fatigue could be related to this.  Recently she decided to get all of her INR care done at [facility] after Dr. Wilson Singer retired.  I discussed the possibility of cardioversion and try to get her back into rhythm.  She understands that she will need at least 1 month of weekly INRs to be therapeutic.  We went ahead and assessed an INR today and her INR was only 1.1.  She says she has been compliant with medications however she will need significant dose changes.  Are anticoagulation pharmacist will be in contact with the pharmacist at [facility] to see if we can get her back to a therapeutic INR which she will need to maintain for at least one month before considering cardioversion.  With regard to her increased rate, I recommended increasing her Cardizem CD to 180 mg twice daily today.  Follow-up in 4-6 weeks.  Pixie Casino, MD, [facility] Attending Cardiologist [place] 08/18/2017, 10:49 AM

## 2017-08-24 ENCOUNTER — Ambulatory Visit (INDEPENDENT_AMBULATORY_CARE_PROVIDER_SITE_OTHER): Payer: Medicare Other | Admitting: Pharmacist

## 2017-08-24 DIAGNOSIS — I480 Paroxysmal atrial fibrillation: Secondary | ICD-10-CM

## 2017-08-24 DIAGNOSIS — Z7901 Long term (current) use of anticoagulants: Secondary | ICD-10-CM

## 2017-08-24 LAB — POCT INR: INR: 1.6

## 2017-08-24 MED ORDER — POTASSIUM CHLORIDE CRYS ER 20 MEQ PO TBCR: 20.0000 meq | EXTENDED_RELEASE_TABLET | Freq: Every day | ORAL | 0 refills | Status: DC

## 2017-08-31 ENCOUNTER — Ambulatory Visit (INDEPENDENT_AMBULATORY_CARE_PROVIDER_SITE_OTHER): Payer: Medicare Other | Admitting: Pharmacist

## 2017-08-31 DIAGNOSIS — Z7901 Long term (current) use of anticoagulants: Secondary | ICD-10-CM | POA: Diagnosis not present

## 2017-08-31 DIAGNOSIS — I480 Paroxysmal atrial fibrillation: Secondary | ICD-10-CM

## 2017-08-31 LAB — POCT INR: INR: 2.1

## 2017-09-05 ENCOUNTER — Telehealth: Payer: Self-pay | Admitting: Internal Medicine

## 2017-09-05 ENCOUNTER — Other Ambulatory Visit: Payer: Self-pay | Admitting: Pharmacist Clinician (PhC)/ Clinical Pharmacy Specialist

## 2017-09-05 MED ORDER — WARFARIN SODIUM 5 MG PO TABS: ORAL_TABLET | ORAL | 3 refills | Status: DC

## 2017-09-05 NOTE — Telephone Encounter (Signed)
Returned the call to the patient. She stated that since the increase in the diltiazem to 180 mg bid that she has been having dizzy spells. She has been taking it easy and resting when the spells hit. She did state that she feels a lot better today and has not felt dizzy. She has an appointment on Wednesday with the Coumadin clinic. She has been instructed to call the office if she starts to feel dizzy again. She verbalized her understanding.

## 2017-09-05 NOTE — Telephone Encounter (Signed)
New message   Patient states diltiazem (CARDIZEM CD) 180 MG 24 hr capsule was increased and now she feels dizzy.  Please call  STAT if patient feels like he/she is going to faint   1) Are you dizzy now?  " a little bit"  2) Do you feel faint or have you passed out? NO  3) Do you have any other symptoms? weak  4) Have you checked your HR and BP (record if available)?  NO

## 2017-09-07 ENCOUNTER — Ambulatory Visit (INDEPENDENT_AMBULATORY_CARE_PROVIDER_SITE_OTHER): Payer: Medicare Other | Admitting: Pharmacist

## 2017-09-07 DIAGNOSIS — I480 Paroxysmal atrial fibrillation: Secondary | ICD-10-CM

## 2017-09-07 DIAGNOSIS — Z7901 Long term (current) use of anticoagulants: Secondary | ICD-10-CM

## 2017-09-07 LAB — POCT INR: INR: 1.8

## 2017-09-07 NOTE — Patient Instructions (Signed)
Increase dose taking 7.5 mg daily.  Repeat INR in 1 week  Pulse 66; BP 118/82 - Take diltiazem 180mg  daily instead of twice per day (start 09/07/2017)

## 2017-09-07 NOTE — Telephone Encounter (Signed)
Patient still experiencing sever dizziness and fatigue.  HR remains appropriate at 66 bpm with BP 118/82. Will decrease diltiazem back to 180mg  daily x 1 week and re-assess symptoms next week during coumadin clinic f/u

## 2017-09-14 ENCOUNTER — Ambulatory Visit (INDEPENDENT_AMBULATORY_CARE_PROVIDER_SITE_OTHER): Payer: Medicare Other | Admitting: Pharmacist Clinician (PhC)/ Clinical Pharmacy Specialist

## 2017-09-14 ENCOUNTER — Telehealth: Payer: Self-pay | Admitting: Pharmacist Clinician (PhC)/ Clinical Pharmacy Specialist

## 2017-09-14 DIAGNOSIS — I480 Paroxysmal atrial fibrillation: Secondary | ICD-10-CM

## 2017-09-14 DIAGNOSIS — Z7901 Long term (current) use of anticoagulants: Secondary | ICD-10-CM

## 2017-09-14 LAB — POCT INR: INR: 1.5

## 2017-09-14 NOTE — Telephone Encounter (Signed)
She states she has been compliant, but it's hard to know for sure.   I gave her a big bump today and we have her scheduled for the next 3 weeks.

## 2017-09-14 NOTE — Telephone Encounter (Signed)
Patient diltiazem was increased from 180 mg to 360 mg (180 mg bid).  At her INR last week she was feeling poorly - dizziness and fatigue.  BP was 118/82.  Raquel had her drop dose back to 180 mg once daily.  Today in the office her BP is 110/72, HR 48.  She is feeling much better, despite low HR.   Will have her continue with the 180 mg diltiazem for now.

## 2017-09-14 NOTE — Telephone Encounter (Signed)
Ok thanks .[name] Eliezer Lofts advised me that her weekly INR's have not been therapeutic. Is there a compliance issue?  -Mali

## 2017-09-20 ENCOUNTER — Ambulatory Visit: Payer: Medicare Other | Admitting: Internal Medicine

## 2017-09-28 ENCOUNTER — Ambulatory Visit (INDEPENDENT_AMBULATORY_CARE_PROVIDER_SITE_OTHER): Payer: Medicare Other | Admitting: Pharmacist

## 2017-09-28 DIAGNOSIS — Z7901 Long term (current) use of anticoagulants: Secondary | ICD-10-CM

## 2017-09-28 DIAGNOSIS — I480 Paroxysmal atrial fibrillation: Secondary | ICD-10-CM | POA: Diagnosis not present

## 2017-09-28 LAB — POCT INR: INR: 2.3

## 2017-10-05 ENCOUNTER — Ambulatory Visit (INDEPENDENT_AMBULATORY_CARE_PROVIDER_SITE_OTHER): Payer: Medicare Other | Admitting: Pharmacist Clinician (PhC)/ Clinical Pharmacy Specialist

## 2017-10-05 DIAGNOSIS — Z7901 Long term (current) use of anticoagulants: Secondary | ICD-10-CM | POA: Diagnosis not present

## 2017-10-05 DIAGNOSIS — I480 Paroxysmal atrial fibrillation: Secondary | ICD-10-CM

## 2017-10-05 LAB — POCT INR: INR: 2.1

## 2017-10-05 NOTE — Patient Instructions (Signed)
Description   Increase dose to 1.5 tablets daily except 2 tablets each Monday, Wednesday, Friday, repeat INR in 1 week

## 2017-10-17 ENCOUNTER — Other Ambulatory Visit: Payer: Self-pay

## 2017-10-17 MED ORDER — ATORVASTATIN CALCIUM 20 MG PO TABS: 20.0000 mg | ORAL_TABLET | Freq: Every day | ORAL | 3 refills | Status: DC

## 2017-10-17 NOTE — Telephone Encounter (Signed)
Rx(s) sent to pharmacy electronically.  

## 2017-11-01 ENCOUNTER — Ambulatory Visit (INDEPENDENT_AMBULATORY_CARE_PROVIDER_SITE_OTHER): Payer: Medicare Other | Admitting: Pharmacist

## 2017-11-01 ENCOUNTER — Encounter: Payer: Self-pay | Admitting: Adult Health

## 2017-11-01 ENCOUNTER — Ambulatory Visit (INDEPENDENT_AMBULATORY_CARE_PROVIDER_SITE_OTHER): Payer: Medicare Other | Admitting: Adult Health

## 2017-11-01 VITALS — BP 106/64 | HR 90 | Ht 62.0 in | Wt 164.4 lb

## 2017-11-01 DIAGNOSIS — I480 Paroxysmal atrial fibrillation: Secondary | ICD-10-CM

## 2017-11-01 DIAGNOSIS — I10 Essential (primary) hypertension: Secondary | ICD-10-CM

## 2017-11-01 DIAGNOSIS — I4891 Unspecified atrial fibrillation: Secondary | ICD-10-CM | POA: Diagnosis not present

## 2017-11-01 DIAGNOSIS — E7800 Pure hypercholesterolemia, unspecified: Secondary | ICD-10-CM

## 2017-11-01 DIAGNOSIS — I4821 Permanent atrial fibrillation: Secondary | ICD-10-CM

## 2017-11-01 DIAGNOSIS — I482 Chronic atrial fibrillation: Secondary | ICD-10-CM

## 2017-11-01 DIAGNOSIS — Z7901 Long term (current) use of anticoagulants: Secondary | ICD-10-CM

## 2017-11-01 LAB — POCT INR: INR: 1.8

## 2017-11-01 MED ORDER — DILTIAZEM HCL ER COATED BEADS 180 MG PO CP24: 180.0000 mg | ORAL_CAPSULE | Freq: Every day | ORAL | 3 refills | Status: DC

## 2017-11-01 NOTE — Patient Instructions (Signed)
Medication Instructions:  NO CHANGES-Your physician recommends that you continue on your current medications as directed. Please refer to the Current Medication list given to you today.  If you need a refill on your cardiac medications before your next appointment, please call your pharmacy.  Testing/Procedures: Echocardiogram - Your physician has requested that you have an echocardiogram. Echocardiography is a painless test that uses sound waves to create images of your heart. It provides your doctor with information about the size and shape of your heart and how well your heart's chambers and valves are working. This procedure takes approximately one hour. There are no restrictions for this procedure. This will be performed at our [facility] location - [address].  Special Instructions:  MAKE SURE TO CALL WHEN YOU GET HOME TO GIVE [place] THE NAME OF THE MEDICATION  MAKE SURE TO KEEP COUMADIN CLINIC APPOINTMENTS TO KEEP INR THERAPEUTIC RANGE   Follow-Up: Your physician wants you to follow-up in: 3 MONTHS WITH DR HILTY -OR- Jory Sims, DNP.   Thank you for choosing [facility] at [facility]!!

## 2017-11-01 NOTE — Progress Notes (Signed)
Cardiology Office Note   Date:  11/01/2017   ID:  Nekeya, Briski [birth year], MRN [id number]  PCP:  Deland Pretty, MD  Cardiologist: Dr. Debara Pickett Chief Complaint  Patient presents with  . Atrial Fibrillation     History of Present Illness: [name] is a 83 y.o. female who presents for ongoing assessment and management of paroxysmal atrial fibrillation on Coumadin, dyslipidemia, hypertension, with other history to include hypothyroidism, diabetes type 2.  Last seen by Dr. Debara Pickett on 08/18/2017 the patient was having issues with breakthrough paroxysmal atrial fibrillation.  And at the time of her office appointment was also found to be persistent.  The patient was asymptomatic with recurrent atrial fibrillation and was unable to tell if she was experiencing it at all.  On last office visit with Dr. Debara Pickett on 08/18/2017 the patient was back in A. fib with RVR.  Was symptomatic with this concerning fatigue.  She is also subtherapeutic on Coumadin INR level.  Discussion was had with patient to consider cardioversion.  She would require to have these 1 month of therapeutic INR prior to considering this.  Diltiazem was increased to 180 mg twice daily.  Patient had been followed by [facility] for INR checks and dosing of Coumadin.  However,she is now being followed in our own Coumadin clinic for closer follow-up.  Comes today confused about her medications.  The patient is being followed by primary care who is also manipulated her medications.  Is now only taking diltiazem 180 mg daily.  She apparently was changed to a daily diuretic although she does not know the name of it, and continues to take Lasix as needed.  Most recent Coumadin clinic visit was 10/05/2017 with an INR of 2.1.  Past Medical History:  Diagnosis Date  . Colon polyp   . Diabetes mellitus   . Hyperlipidemia   . Hypertension   . Poor appetite   . Thyroid disease     Past Surgical History:  Procedure Laterality  Date  . BREAST EXCISIONAL BIOPSY Right   . CARDIOVASCULAR STRESS TEST  07/15/2006   Normal scan, no ECG changes  . TRANSTHORACIC ECHOCARDIOGRAM  01/08/2011   EF 60-65%, moderae mitral regurg, LA mild-moderately dilated,     Current Outpatient Medications  Medication Sig Dispense Refill  . atorvastatin (LIPITOR) 20 MG tablet Take 1 tablet (20 mg total) by mouth daily at 6 PM. 90 tablet 3  . diltiazem (CARDIZEM CD) 180 MG 24 hr capsule Take 1 capsule (180 mg total) 2 (two) times daily by mouth. 180 capsule 3  . furosemide (LASIX) 20 MG tablet Take 1 tablet (20 mg total) by mouth as needed for edema. 90 tablet 0  . levothyroxine (SYNTHROID, LEVOTHROID) 100 MCG tablet Take 100 mcg by mouth daily before breakfast.    . metFORMIN (GLUCOPHAGE) 500 MG tablet Take 1,000 mg by mouth 2 (two) times daily with a meal.     . metoprolol succinate (TOPROL-XL) 50 MG 24 hr tablet Take 1 tablet (50 mg total) by mouth 2 (two) times daily. 60 tablet 10  . potassium chloride SA (KLOR-CON M20) 20 MEQ tablet Take 1 tablet (20 mEq total) daily by mouth. 30 tablet 0  . warfarin (COUMADIN) 5 MG tablet TAKE 1 TO 1.5 TABLETS DAILY AS DIRECTED BY COUMADIN CLINIC 50 tablet 3   No current facility-administered medications for this visit.     Allergies:   Patient has no known allergies.    Social History:  The patient  reports that she quit smoking about 14 years ago. Her smoking use included cigarettes. she has never used smokeless tobacco. She reports that she does not drink alcohol or use drugs.   Family History:  The patient's family history includes Cancer in her daughter; Diabetes in her mother, sister, and sister; Heart failure in her child; Hyperlipidemia in her father and sister; Hypertension in her brother, father, sister, and sister; Stroke in her sister.    ROS: All other systems are reviewed and negative. Unless otherwise mentioned in H&P    PHYSICAL EXAM: VS:  BP 106/64   Pulse 90   Ht 5\' 2"  (1.575  m)   Wt 164 lb 6.4 oz (74.6 kg)   BMI 30.07 kg/m  , BMI Body mass index is 30.07 kg/m. GEN: Well nourished, well developed, in no acute distress  HEENT: normal  Neck: no JVD, carotid bruits, or masses Cardiac: IRRR; no murmurs, rubs, or gallops,non pitting  edema  Respiratory:  clear to auscultation bilaterally, normal work of breathing GI: soft, nontender, nondistended, + BS, obese.  MS: no deformity or atrophy  Skin: warm and dry, no rash Neuro:  Strength and sensation are intact Psych: euthymic mood, full affect   EKG: Coarse atrial fibrillation, rate of 90 bpm, mild LVH, T wave flattening is noted inferior laterally.  Recent Labs: 06/08/2017: Hemoglobin 13.1; Platelets 226 06/09/2017: BUN 38; Creatinine, Ser 1.19; Potassium 3.9; Sodium 136    Lipid Panel    Component Value Date/Time   CHOL  01/09/2011 0430    88        ATP III CLASSIFICATION:  <200     mg/dL   Desirable  200-239  mg/dL   Borderline High  >=240    mg/dL   High          TRIG 101 01/09/2011 0430   HDL 36 (L) 01/09/2011 0430   CHOLHDL 2.4 01/09/2011 0430   VLDL 20 01/09/2011 0430   LDLCALC  01/09/2011 0430    32        Total Cholesterol/HDL:CHD Risk Coronary Heart Disease Risk Table                     Men   Women  1/2 Average Risk   3.4   3.3  Average Risk       5.0   4.4  2 X Average Risk   9.6   7.1  3 X Average Risk  23.4   11.0        Use the calculated Patient Ratio above and the CHD Risk Table to determine the patient's CHD Risk.        ATP III CLASSIFICATION (LDL):  <100     mg/dL   Optimal  100-129  mg/dL   Near or Above                    Optimal  130-159  mg/dL   Borderline  160-189  mg/dL   High  >190     mg/dL   Very High      Wt Readings from Last 3 Encounters:  11/01/17 164 lb 6.4 oz (74.6 kg)  08/18/17 162 lb 9.6 oz (73.8 kg)  08/15/17 162 lb (73.5 kg)     ASSESSMENT AND PLAN:  1.  Persistent atrial fibrillation: The patient is currently on 180 mg of diltiazem which  is a lower dose than which she was last placed on by Dr. Debara Pickett at 180  mg twice daily.  The patient continues on Coumadin but has missed appointments.  Most recent appointment was in December 2018 with an INR of 2.1.  She is asymptomatic concerning dyspnea, chest pain, dizziness, or bleeding.  She will be seen by our pharmacist today to have INR completed.  Dosing of Coumadin will be adjusted at that time.  She is not interested in cardioversion.  We have talked about this in the clinic and she does not wish to undergo this procedure.  Now we will continue heart rate control and anticoagulation therapy.  We may need to talk about DOAC as she continues to miss her Coumadin clinic appointments.  I would like to get a copy of her most recent labs before making any further adjustments on her medications.  She states she had it drawn 2 weeks ago., by her primary care physician.    2. Hypertension: Blood pressures currently well controlled.  In the setting of hypertension atrial fibrillation I will order echocardiogram as I do not have documentation of recent LV function and atrial size.  3.  Hypercholesterolemia: She remains on atorvastatin.  Labs have recently been drawn requesting results.  4.  Forgetfulness with memory issues: May need to be evaluated further for early dementia.  Will defer to PCP.  This is especially important concerning her anticoagulation therapy and other medications.  She is currently using a bubble pack for medications.  She is however missing follow-up appointments in our Coumadin clinic.  Current medicines are reviewed at length with the patient today.    Labs/ tests ordered today include: Echocardiogram  Phill Myron. West Pugh, ANP, AACC   11/01/2017 1:51 PM    [facility] [address] [place], [place] [address] Phone: [phone]; Fax: [phone]

## 2017-11-01 NOTE — Patient Instructions (Signed)
Take an extra 1/2 tablet today (1/22)ONLY; Continue 1.5 tablets daily except 2 tablets each Monday, Wednesday, Friday, repeat INR in 3 week

## 2017-11-02 ENCOUNTER — Telehealth: Payer: Self-pay

## 2017-11-02 NOTE — Telephone Encounter (Signed)
Lm2cb need to know what is the name of her "new" medication that she has that she could not remember the name of at her appt

## 2017-11-02 NOTE — Telephone Encounter (Signed)
NEw Message  Pt call requesting to speak with RN to inform Dr. Debara Pickett

## 2017-11-03 NOTE — Telephone Encounter (Signed)
Lm2cb-need to know name of medication

## 2017-11-10 MED ORDER — SITAGLIPTIN PHOSPHATE 100 MG PO TABS: 100.0000 mg | ORAL_TABLET | Freq: Every day | ORAL | Status: DC

## 2017-11-10 NOTE — Telephone Encounter (Signed)
New message  Pt verbalized that she is returning call for RN  Pt is calling to give medication name ( pt spelled medication to me and gave me mg amount)  Januvia 100mg 

## 2017-11-10 NOTE — Telephone Encounter (Signed)
Added Januvia to list pt notified this is a diabetic list and will need to be refilled by PCP she states that she will call pharmacy to have refilled

## 2017-11-23 ENCOUNTER — Ambulatory Visit (INDEPENDENT_AMBULATORY_CARE_PROVIDER_SITE_OTHER): Payer: Medicare Other | Admitting: Pharmacist Clinician (PhC)/ Clinical Pharmacy Specialist

## 2017-11-23 DIAGNOSIS — Z7901 Long term (current) use of anticoagulants: Secondary | ICD-10-CM | POA: Diagnosis not present

## 2017-11-23 DIAGNOSIS — I480 Paroxysmal atrial fibrillation: Secondary | ICD-10-CM | POA: Diagnosis not present

## 2017-11-23 LAB — POCT INR: INR: 2.4

## 2017-11-23 NOTE — Patient Instructions (Signed)
Description   Continue 1.5 tablets daily except 2 tablets each Monday, Wednesday, Friday. Repeat INR in 4 weeks

## 2017-11-28 ENCOUNTER — Other Ambulatory Visit: Payer: Self-pay | Admitting: Internal Medicine

## 2017-12-20 ENCOUNTER — Other Ambulatory Visit (HOSPITAL_COMMUNITY): Payer: Medicare Other

## 2017-12-20 DIAGNOSIS — R0989 Other specified symptoms and signs involving the circulatory and respiratory systems: Secondary | ICD-10-CM

## 2017-12-21 ENCOUNTER — Ambulatory Visit (INDEPENDENT_AMBULATORY_CARE_PROVIDER_SITE_OTHER): Payer: Medicare Other | Admitting: Pharmacist Clinician (PhC)/ Clinical Pharmacy Specialist

## 2017-12-21 DIAGNOSIS — I480 Paroxysmal atrial fibrillation: Secondary | ICD-10-CM | POA: Diagnosis not present

## 2017-12-21 DIAGNOSIS — Z7901 Long term (current) use of anticoagulants: Secondary | ICD-10-CM | POA: Diagnosis not present

## 2017-12-21 LAB — POCT INR: INR: 2

## 2017-12-21 NOTE — Patient Instructions (Signed)
Description   Continue 1.5 tablets daily except 2 tablets each Monday, Wednesday, Friday. Repeat INR in 4 weeks

## 2018-01-20 ENCOUNTER — Other Ambulatory Visit: Payer: Self-pay

## 2018-01-20 ENCOUNTER — Ambulatory Visit (HOSPITAL_COMMUNITY): Payer: Medicare Other | Attending: Cardiology

## 2018-01-20 DIAGNOSIS — I482 Chronic atrial fibrillation: Secondary | ICD-10-CM | POA: Insufficient documentation

## 2018-01-20 DIAGNOSIS — I340 Nonrheumatic mitral (valve) insufficiency: Secondary | ICD-10-CM | POA: Insufficient documentation

## 2018-01-20 DIAGNOSIS — E785 Hyperlipidemia, unspecified: Secondary | ICD-10-CM | POA: Insufficient documentation

## 2018-01-20 DIAGNOSIS — I4891 Unspecified atrial fibrillation: Secondary | ICD-10-CM | POA: Insufficient documentation

## 2018-01-20 DIAGNOSIS — I4821 Permanent atrial fibrillation: Secondary | ICD-10-CM

## 2018-01-20 DIAGNOSIS — E119 Type 2 diabetes mellitus without complications: Secondary | ICD-10-CM | POA: Insufficient documentation

## 2018-01-20 DIAGNOSIS — I10 Essential (primary) hypertension: Secondary | ICD-10-CM | POA: Diagnosis not present

## 2018-02-01 ENCOUNTER — Ambulatory Visit (INDEPENDENT_AMBULATORY_CARE_PROVIDER_SITE_OTHER): Payer: Medicare Other | Admitting: Pharmacist

## 2018-02-01 DIAGNOSIS — I480 Paroxysmal atrial fibrillation: Secondary | ICD-10-CM

## 2018-02-01 DIAGNOSIS — Z7901 Long term (current) use of anticoagulants: Secondary | ICD-10-CM | POA: Diagnosis not present

## 2018-02-01 LAB — POCT INR: INR: 2.3

## 2018-02-01 NOTE — Patient Instructions (Signed)
Description   Continue 1.5 tablets daily except 2 tablets each Monday, Wednesday, Friday. Repeat INR in 6 weeks

## 2018-02-03 ENCOUNTER — Other Ambulatory Visit: Payer: Self-pay | Admitting: Internal Medicine

## 2018-03-15 ENCOUNTER — Ambulatory Visit (INDEPENDENT_AMBULATORY_CARE_PROVIDER_SITE_OTHER): Payer: Medicare Other | Admitting: Pharmacist Clinician (PhC)/ Clinical Pharmacy Specialist

## 2018-03-15 ENCOUNTER — Ambulatory Visit (INDEPENDENT_AMBULATORY_CARE_PROVIDER_SITE_OTHER): Payer: Medicare Other | Admitting: Internal Medicine

## 2018-03-15 ENCOUNTER — Encounter: Payer: Self-pay | Admitting: Internal Medicine

## 2018-03-15 VITALS — BP 114/62 | HR 101 | Ht 62.0 in | Wt 155.6 lb

## 2018-03-15 DIAGNOSIS — I10 Essential (primary) hypertension: Secondary | ICD-10-CM | POA: Diagnosis not present

## 2018-03-15 DIAGNOSIS — E7800 Pure hypercholesterolemia, unspecified: Secondary | ICD-10-CM | POA: Diagnosis not present

## 2018-03-15 DIAGNOSIS — I480 Paroxysmal atrial fibrillation: Secondary | ICD-10-CM

## 2018-03-15 DIAGNOSIS — Z7901 Long term (current) use of anticoagulants: Secondary | ICD-10-CM

## 2018-03-15 LAB — POCT INR: INR: 2.3 (ref 2.0–3.0)

## 2018-03-15 MED ORDER — ATORVASTATIN CALCIUM 40 MG PO TABS: 40.0000 mg | ORAL_TABLET | Freq: Every day | ORAL | 3 refills | Status: DC

## 2018-03-15 NOTE — Progress Notes (Signed)
OFFICE NOTE  Chief Complaint:  Routine follow-up  Primary Care Physician: Deland Pretty, MD  HPI:  [name] is a 83 year old overweight African American female with a history of paroxysmal atrial fibrillation on Coumadin, hypothyroidism, dyslipidemia, diabetes mellitus type 2, hypertension.  She had an episode of PAF in 2014, but this went away fairly quickly and she is not been symptomatic since then. This may be related to her not taking her medications as scheduled. Since then she's been taking her metoprolol twice daily and has had no real problems. Her warfarin level remains therapeutic and well controlled. Over the past several months she's had a problem with breakthrough paroxysmal atrial fibrillation.  In fact at this point she seems to be persistent.  She saw 2 other physician assistants in our office to were able to get her rate controlled. Once she was at a normal ventricular rate, she felt fairly good. In fact she cannot really tell difference when she is in A. fib versus when she is in sinus rhythm.  Previously her episode was possibly related to thyroid and balance, however her thyroid levels have been well controlled as has her diabetes.   [name] returns today for follow-up. Her EKG shows sinus rhythm. It is notable that she is bradycardic today with heart rate in the upper 40s. INR was checked in the office today and is low at 1.7. This will be adjusted by Erasmo Downer, our anticoagulation pharmacist. She denies any complaints such as shortness of breath, increasing fatigue, presyncope or syncopal symptoms. She also denies any chest pain or palpitations and is unaware of any recurrent atrial fibrillation. She's recently been started on an injectable insulin in addition to her oral Glucophage for diabetes. Her cholesterol is also managed by her primary care provider.  07/15/2016  [name] returns today for follow-up. She's been followed monthly for her INRs which been  therapeutic. She reports feeling well denies any chest pain or recurrent atrial fibrillation. EKG shows sinus rhythm. Overall she feels that she is doing well. Blood prssure was mildly elevated today recently was 122/60 in the office.  08/18/2017  [name] returns today for follow-up.  She was last seen about a year ago.  Overall she seems to be doing okay but she is slowing down.  She says she cannot work quite as much as she could previously.  Although she has a history of atrial fibrillation, recently she has had no recurrence.  Today however she is noted to be in A. fib with RVR and heart rate of 123.  He denies any chest pain or worsening shortness of breath with this.  03/15/2018  [name] was seen today in follow-up.  Overall she is feeling well.  Her INR was therapeutic today.  Blood pressure is well controlled.  A. fib rate is slightly fast at 101.  She is asymptomatic with this.  She reports good energy level except in the mornings where she is somewhat fatigued.  Hemoglobin A1c recently in December was 9.9 however she has had medication adjustment and lost about 10 pounds since then.  The dietary changes should have helped her.  In December her cholesterol was very elevated as well with total cholesterol 261, HDL 57, LDL 173 and triglycerides 153.  We discussed her diet today and she reports she still eats out almost every day.  She has been going to a fish restaurant and eating fried fish several times a week which likely is raising her saturated fats  intake.  I we discussed healthier options she can choose however she is unlikely to meet her LDL goal less than 70.  PMHx:  Past Medical History:  Diagnosis Date  . Colon polyp   . Diabetes mellitus   . Hyperlipidemia   . Hypertension   . Poor appetite   . Thyroid disease     Past Surgical History:  Procedure Laterality Date  . BREAST EXCISIONAL BIOPSY Right   . CARDIOVASCULAR STRESS TEST  07/15/2006   Normal scan, no ECG changes    . TRANSTHORACIC ECHOCARDIOGRAM  01/08/2011   EF 60-65%, moderae mitral regurg, LA mild-moderately dilated,    FAMHx:  Family History  Problem Relation Age of Onset  . Diabetes Mother   . Hypertension Father   . Hyperlipidemia Father   . Diabetes Sister   . Hypertension Sister   . Hyperlipidemia Sister   . Hypertension Brother   . Stroke Sister   . Hypertension Sister   . Diabetes Sister   . Cancer Daughter   . Heart failure Child   . Breast cancer Neg Hx     SOCHx:   reports that she quit smoking about 14 years ago. Her smoking use included cigarettes. She has never used smokeless tobacco. She reports that she does not drink alcohol or use drugs.  ALLERGIES:  No Known Allergies  ROS: Pertinent items noted in HPI and remainder of comprehensive ROS otherwise negative.  HOME MEDS: Current Outpatient Medications  Medication Sig Dispense Refill  . atorvastatin (LIPITOR) 20 MG tablet Take 1 tablet (20 mg total) by mouth daily at 6 PM. 90 tablet 3  . diltiazem (CARDIZEM CD) 180 MG 24 hr capsule Take 1 capsule (180 mg total) by mouth daily. 180 capsule 3  . furosemide (LASIX) 20 MG tablet Take 1 tablet (20 mg total) by mouth as needed for edema. 90 tablet 0  . levothyroxine (SYNTHROID, LEVOTHROID) 100 MCG tablet Take 100 mcg by mouth daily before breakfast.    . metFORMIN (GLUCOPHAGE) 500 MG tablet Take 1,000 mg by mouth 2 (two) times daily with a meal.     . metoprolol succinate (TOPROL-XL) 50 MG 24 hr tablet Take 1 tablet (50 mg total) by mouth 2 (two) times daily. 60 tablet 10  . potassium chloride SA (KLOR-CON M20) 20 MEQ tablet Take 1 tablet (20 mEq total) daily by mouth. 30 tablet 0  . sitaGLIPtin (JANUVIA) 100 MG tablet Take 1 tablet (100 mg total) by mouth daily.    [name] warfarin (COUMADIN) 5 MG tablet TAKE 1 TO 1.5 TABLETS DAILY AS DIRECTED BY COUMADIN CLINIC 50 tablet 1   No current facility-administered medications for this visit.     LABS/IMAGING: Results for orders  placed or performed in visit on 03/15/18 (from the past 48 hour(s))  POCT INR     Status: None   Collection Time: 03/15/18  9:33 AM  Result Value Ref Range   INR 2.3 2.0 - 3.0   No results found.  VITALS: BP 114/62   Pulse (!) 101   Ht 5\' 2"  (1.575 m)   Wt 155 lb 9.6 oz (70.6 kg)   BMI 28.46 kg/m   EXAM: General appearance: alert and no distress Neck: no carotid bruit, no JVD and thyroid not enlarged, symmetric, no tenderness/mass/nodules Lungs: clear to auscultation bilaterally Heart: irregularly irregular rhythm and Tachycardic Abdomen: soft, non-tender; bowel sounds normal; no masses,  no organomegaly Extremities: extremities normal, atraumatic, no cyanosis or edema Pulses: 2+ and symmetric Skin:  Skin color, texture, turgor normal. No rashes or lesions Neurologic: Grossly normal Psych: Pleasant  EKG: A. fib with RVR at 101-personally reviewed  ASSESSMENT: 1. A. fib with RVR- CHADSVASC score of 4 on warfarin 2. Hypertension 3. Diabetes type 2  4. Hypothyroidism 5. Dyslipidemia  PLAN: 1.   [name] has borderline rate control with her A. fib.  She maintains on warfarin with a stable INR.  Blood pressure is well controlled.  She is recently had 10 pound weight loss which I commended her on.  These dietary changes and medication changes were in response to elevated hemoglobin A1c of 9.9 and marked dyslipidemia.  She is far away from her target LDL less than 70 therefore I recommend increasing her atorvastatin to 40 mg.  She wants to work aggressively on her diet and eliminating fried foods.  We will plan a repeat lipid profile in 3 months.  If she is not at goal then I would recommend adding ezetimibe.  She has concerns about the cost of medications and it may be cost prohibitive for her to be on PCSK9 inhibitor.  Follow-up with me in 6 months.  Pixie Casino, MD, [facility], [place] Director of the Advanced Lipid Disorders &    Cardiovascular Risk Reduction Clinic Diplomate of the American Board of Clinical Lipidology Attending Cardiologist  Direct Dial: [phone]  Fax: [phone]  Website:  [email or web address] 03/15/2018, 9:40 AM

## 2018-03-15 NOTE — Patient Instructions (Signed)
Your physician has recommended you make the following change in your medication: INCREASE atorvastatin to 40mg  daily  Your physician recommends that you return for lab work in [place] - fasting - to check cholesterol   Your physician wants you to follow-up in: [place] with Dr. Debara Pickett. You will receive a reminder letter in the mail two months in advance. If you don't receive a letter, please call our office to schedule the follow-up appointment.

## 2018-03-15 NOTE — Patient Instructions (Signed)
Description   Continue 1.5 tablets daily except 2 tablets each Monday, Wednesday, Friday. Repeat INR in 6 weeks

## 2018-05-01 ENCOUNTER — Ambulatory Visit (INDEPENDENT_AMBULATORY_CARE_PROVIDER_SITE_OTHER): Payer: Medicare Other | Admitting: Pharmacist

## 2018-05-01 DIAGNOSIS — I480 Paroxysmal atrial fibrillation: Secondary | ICD-10-CM | POA: Diagnosis not present

## 2018-05-01 DIAGNOSIS — Z7901 Long term (current) use of anticoagulants: Secondary | ICD-10-CM | POA: Diagnosis not present

## 2018-05-01 LAB — POCT INR: INR: 1.7 — AB (ref 2.0–3.0)

## 2018-05-01 NOTE — Patient Instructions (Signed)
Take 2.5 tablets today (7/22) ONLY, then continue 1.5 tablets daily except 2 tablets each Monday, Wednesday, Friday. Repeat INR in 4 weeks

## 2018-05-13 ENCOUNTER — Other Ambulatory Visit: Payer: Self-pay | Admitting: Internal Medicine

## 2018-05-24 ENCOUNTER — Other Ambulatory Visit: Payer: Self-pay | Admitting: Internal Medicine

## 2018-07-18 ENCOUNTER — Telehealth: Payer: Self-pay | Admitting: Pharmacist Clinician (PhC)/ Clinical Pharmacy Specialist

## 2018-07-18 NOTE — Telephone Encounter (Signed)
Patient on warfarin, last INR in August.  LMOM for patient to return call

## 2018-07-21 NOTE — Telephone Encounter (Signed)
LMOM (#2) for patient to return call

## 2018-07-24 NOTE — Telephone Encounter (Signed)
Patient called, appt scheduled for 10/21

## 2018-08-01 ENCOUNTER — Other Ambulatory Visit: Payer: Self-pay | Admitting: Internal Medicine

## 2018-08-01 NOTE — Telephone Encounter (Signed)
Rx request sent to pharmacy.  

## 2018-08-02 ENCOUNTER — Ambulatory Visit (INDEPENDENT_AMBULATORY_CARE_PROVIDER_SITE_OTHER): Payer: Medicare Other | Admitting: Pharmacist Clinician (PhC)/ Clinical Pharmacy Specialist

## 2018-08-02 DIAGNOSIS — I480 Paroxysmal atrial fibrillation: Secondary | ICD-10-CM

## 2018-08-02 DIAGNOSIS — Z7901 Long term (current) use of anticoagulants: Secondary | ICD-10-CM

## 2018-08-02 LAB — POCT INR: INR: 4.2 — AB (ref 2.0–3.0)

## 2018-08-05 ENCOUNTER — Other Ambulatory Visit: Payer: Self-pay | Admitting: Internal Medicine

## 2018-08-16 ENCOUNTER — Ambulatory Visit (INDEPENDENT_AMBULATORY_CARE_PROVIDER_SITE_OTHER): Payer: Medicare Other | Admitting: Pharmacist

## 2018-08-16 DIAGNOSIS — Z7901 Long term (current) use of anticoagulants: Secondary | ICD-10-CM

## 2018-08-16 DIAGNOSIS — I480 Paroxysmal atrial fibrillation: Secondary | ICD-10-CM

## 2018-08-16 LAB — POCT INR: INR: 2.9 (ref 2.0–3.0)

## 2018-08-23 ENCOUNTER — Other Ambulatory Visit: Payer: Self-pay | Admitting: Internal Medicine

## 2018-08-23 DIAGNOSIS — Z1231 Encounter for screening mammogram for malignant neoplasm of breast: Secondary | ICD-10-CM

## 2018-09-06 ENCOUNTER — Encounter: Payer: Medicare Other | Attending: Internal Medicine | Admitting: Registered"

## 2018-09-06 ENCOUNTER — Encounter: Payer: Self-pay | Admitting: Registered"

## 2018-09-06 DIAGNOSIS — Z713 Dietary counseling and surveillance: Secondary | ICD-10-CM | POA: Insufficient documentation

## 2018-09-06 DIAGNOSIS — Z8249 Family history of ischemic heart disease and other diseases of the circulatory system: Secondary | ICD-10-CM | POA: Insufficient documentation

## 2018-09-06 DIAGNOSIS — Z79890 Hormone replacement therapy (postmenopausal): Secondary | ICD-10-CM | POA: Diagnosis not present

## 2018-09-06 DIAGNOSIS — E1140 Type 2 diabetes mellitus with diabetic neuropathy, unspecified: Secondary | ICD-10-CM | POA: Insufficient documentation

## 2018-09-06 DIAGNOSIS — Z7984 Long term (current) use of oral hypoglycemic drugs: Secondary | ICD-10-CM | POA: Insufficient documentation

## 2018-09-06 DIAGNOSIS — E1165 Type 2 diabetes mellitus with hyperglycemia: Secondary | ICD-10-CM | POA: Diagnosis not present

## 2018-09-06 DIAGNOSIS — Z79899 Other long term (current) drug therapy: Secondary | ICD-10-CM | POA: Insufficient documentation

## 2018-09-06 DIAGNOSIS — E039 Hypothyroidism, unspecified: Secondary | ICD-10-CM | POA: Diagnosis not present

## 2018-09-06 DIAGNOSIS — E1122 Type 2 diabetes mellitus with diabetic chronic kidney disease: Secondary | ICD-10-CM | POA: Diagnosis not present

## 2018-09-06 DIAGNOSIS — Z7901 Long term (current) use of anticoagulants: Secondary | ICD-10-CM | POA: Insufficient documentation

## 2018-09-06 DIAGNOSIS — I4891 Unspecified atrial fibrillation: Secondary | ICD-10-CM | POA: Insufficient documentation

## 2018-09-06 NOTE — Patient Instructions (Signed)
Consider eating balanced meals and snacks, can use the snack sheet. When you have a taste for hush puppies, consider just having 1 Continue having the Boost to supplement your diet Consider reviewing your blood sugar every night and look for patterns for things that you can change to prevent your blood sugar from going too low and going to high. Continue with your plan to start going to the [employer] 3x week

## 2018-09-06 NOTE — Progress Notes (Signed)
Diabetes Self-Management Education  Visit Type: First/Initial  Appt. Start Time: 1400 Appt. End Time: 7169  09/06/2018  [name], identified by name and date of birth, is a 83 y.o. female with a diagnosis of Diabetes: Type 2.   ASSESSMENT Patient states she has been able to keep her BG controlled for a long time, but recently had started just eating whatever she wanted. Pt states she is also going through grief because of the time of year when two of her daughters passed away Jun 13, 2006).   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 Pt states she also stays busy helping out taking care of her grandson.  Pt states she limits fried food to 1x week, mostly has grilled chicken. Pt states she limits some vegetables due to coumadin. Pt states her appetite has reduced over the last 8 months, but is started to get a little better and has started drinking Boost.  Pt states most of her activity comes from yard work. Pt states she has plans to return to her routine of going to the [employer] 3x week. Pt states she quit smoking 15 yrs ago after she lost one of her daughters to cancer.   Sleep: Pt states she falls asleep watching TV around 12:30 wakes up to use the bathroom and turns off the TV, is able to go back to sleep then gets up at 5 am.  Diabetes Self-Management Education - 09/06/18 1519      Visit Information   Visit Type  First/Initial      Initial Visit   Diabetes Type  Type 2    Are you currently following a meal plan?  Yes    Are you taking your medications as prescribed?  Yes    Date Diagnosed  10 yrs ago (per referral has been on insulin 10 yrs)      Health Coping   How would you rate your overall health?  Fair      Psychosocial Assessment   Patient Belief/Attitude about Diabetes  Motivated to manage diabetes    What is the last grade level you completed in school?  12      Complications    Last HgB A1C per patient/outside source  13.2 %    How often do you check your blood sugar?  > 4 times/day    Have you had a dilated eye exam in the past 12 months?  Yes    Have you had a dental exam in the past 12 months?  Yes    Are you checking your feet?  Yes    How many days per week are you checking your feet?  7      Dietary Intake   Breakfast  Boost then later will have cheese toast OR boiled egg    Snack (morning)  grapes OR crackers & cheese OR PB on wheat bread    Lunch  chicken, corn, steamed veg, potatoes    Snack (afternoon)  celery, salad    Dinner  fish, steamed veg    Snack (evening)  fruit, graham crackers with PB    Beverage(s)  water, a little unsweet tea, defaf coffee      Exercise   Exercise Type  Light (walking / raking leaves)    How many days per week to you exercise?  4    How many minutes per day do  you exercise?  45    Total minutes per week of exercise  180      Patient Education   Previous Diabetes Education  No    Nutrition management   Role of diet in the treatment of diabetes and the relationship between the three main macronutrients and blood glucose level    Physical activity and exercise   Role of exercise on diabetes management, blood pressure control and cardiac health.      Individualized Goals (developed by patient)   Nutrition  General guidelines for healthy choices and portions discussed    Physical Activity  Exercise 3-5 times per week    Problem Solving  review CGM chart nightly and look for patters for low and high BG      Outcomes   Expected Outcomes  Demonstrated interest in learning. Expect positive outcomes    Future DMSE  2 months    Program Status  Not Completed      Individualized Plan for Diabetes Self-Management Training:   Learning Objective:  Patient will have a greater understanding of diabetes self-management. Patient education plan is to attend individual and/or group sessions per assessed needs and  concerns.  Patient Instructions  Consider eating balanced meals and snacks, can use the snack sheet. When you have a taste for hush puppies, consider just having 1 Continue having the Boost to supplement your diet Consider reviewing your blood sugar every night and look for patterns for things that you can change to prevent your blood sugar from going too low and going to high. Continue with your plan to start going to the [employer] 3x week  Expected Outcomes:  Demonstrated interest in learning. Expect positive outcomes  Education material provided: My Plate and Snack sheet  If problems or questions, patient to contact team via:  Phone  Future DSME appointment: 2 months

## 2018-09-13 ENCOUNTER — Ambulatory Visit: Payer: Medicare Other | Admitting: Pharmacist

## 2018-09-13 ENCOUNTER — Telehealth: Payer: Self-pay | Admitting: Internal Medicine

## 2018-09-13 DIAGNOSIS — I480 Paroxysmal atrial fibrillation: Secondary | ICD-10-CM | POA: Diagnosis not present

## 2018-09-13 DIAGNOSIS — Z7901 Long term (current) use of anticoagulants: Secondary | ICD-10-CM

## 2018-09-13 LAB — PROTIME-INR
INR: >10 (ref 0.8–1.2)
Prothrombin Time: 101.6 s — ABNORMAL HIGH (ref 9.1–12.0)

## 2018-09-13 LAB — POCT INR: INR: 8 — AB (ref 2.0–3.0)

## 2018-09-13 MED ORDER — FUROSEMIDE 20 MG PO TABS: 20.0000 mg | ORAL_TABLET | ORAL | 0 refills | Status: DC | PRN

## 2018-09-13 NOTE — Telephone Encounter (Signed)
New Message   [facility] is calling with a critical lab

## 2018-09-13 NOTE — Telephone Encounter (Signed)
Thank you for the update. Please see coumadin clinic note for additional details.

## 2018-09-13 NOTE — Telephone Encounter (Signed)
Received critical INR from [facility] of >10 Routed to [facility]

## 2018-09-15 ENCOUNTER — Ambulatory Visit (INDEPENDENT_AMBULATORY_CARE_PROVIDER_SITE_OTHER): Payer: Medicare Other | Admitting: Pharmacist Clinician (PhC)/ Clinical Pharmacy Specialist

## 2018-09-15 DIAGNOSIS — I480 Paroxysmal atrial fibrillation: Secondary | ICD-10-CM | POA: Diagnosis not present

## 2018-09-15 DIAGNOSIS — Z7901 Long term (current) use of anticoagulants: Secondary | ICD-10-CM

## 2018-09-15 LAB — POCT INR: INR: 4.3 — AB (ref 2.0–3.0)

## 2018-09-15 NOTE — Patient Instructions (Signed)
Description   No warfarin Friday Dec 6 or Saturday Dec 7.  Then take 1.5 tablets daily except 2 tablets each Monday and Friday.  Repeat INR in 1 week

## 2018-10-03 ENCOUNTER — Ambulatory Visit: Payer: Medicare Other | Admitting: Pharmacist

## 2018-10-03 DIAGNOSIS — I480 Paroxysmal atrial fibrillation: Secondary | ICD-10-CM | POA: Diagnosis not present

## 2018-10-03 DIAGNOSIS — Z7901 Long term (current) use of anticoagulants: Secondary | ICD-10-CM

## 2018-10-03 LAB — POCT INR: INR: 4.6 — AB (ref 2.0–3.0)

## 2018-10-18 ENCOUNTER — Ambulatory Visit
Admission: RE | Admit: 2018-10-18 | Discharge: 2018-10-18 | Disposition: A | Discharge status: Home / Self Care | Payer: Medicare Other | Source: Ambulatory Visit | Attending: Internal Medicine | Admitting: Internal Medicine

## 2018-10-18 DIAGNOSIS — Z1231 Encounter for screening mammogram for malignant neoplasm of breast: Secondary | ICD-10-CM

## 2018-10-25 ENCOUNTER — Encounter: Payer: Medicare Other | Attending: Internal Medicine | Admitting: Registered"

## 2018-10-25 DIAGNOSIS — Z79890 Hormone replacement therapy (postmenopausal): Secondary | ICD-10-CM | POA: Diagnosis not present

## 2018-10-25 DIAGNOSIS — E1165 Type 2 diabetes mellitus with hyperglycemia: Secondary | ICD-10-CM | POA: Insufficient documentation

## 2018-10-25 DIAGNOSIS — Z79899 Other long term (current) drug therapy: Secondary | ICD-10-CM | POA: Diagnosis not present

## 2018-10-25 DIAGNOSIS — E1140 Type 2 diabetes mellitus with diabetic neuropathy, unspecified: Secondary | ICD-10-CM | POA: Diagnosis not present

## 2018-10-25 DIAGNOSIS — Z7901 Long term (current) use of anticoagulants: Secondary | ICD-10-CM | POA: Insufficient documentation

## 2018-10-25 DIAGNOSIS — Z713 Dietary counseling and surveillance: Secondary | ICD-10-CM | POA: Insufficient documentation

## 2018-10-25 DIAGNOSIS — E1122 Type 2 diabetes mellitus with diabetic chronic kidney disease: Secondary | ICD-10-CM | POA: Diagnosis not present

## 2018-10-25 DIAGNOSIS — E039 Hypothyroidism, unspecified: Secondary | ICD-10-CM | POA: Diagnosis not present

## 2018-10-25 DIAGNOSIS — I4891 Unspecified atrial fibrillation: Secondary | ICD-10-CM | POA: Insufficient documentation

## 2018-10-25 DIAGNOSIS — Z7984 Long term (current) use of oral hypoglycemic drugs: Secondary | ICD-10-CM | POA: Insufficient documentation

## 2018-10-25 DIAGNOSIS — Z8249 Family history of ischemic heart disease and other diseases of the circulatory system: Secondary | ICD-10-CM | POA: Diagnosis not present

## 2018-10-25 NOTE — Progress Notes (Signed)
Diabetes Self-Management Education  Visit Type: Follow-up  Appt. Start Time: 1410 Appt. End Time: 5188  10/26/2018  [name], identified by name and date of birth, is a 84 y.o. female with a diagnosis of Diabetes: Type 2.   ASSESSMENT Pt states she has been having trouble with her [name] sensors working and so she is not always able to monitor her BG. Pt states she has her daughter helping her attach the sensors and has been on the phone with the company trying to get them to work. Pt states the company has her to send the sensors back to them. Patient states she has gone as long as a week without the sensor and when not able to know her BG will eat "slight" which she said means she avoid carbs. Due to her recent weight loss with declining appetite, RD believes it is important that she is not afraid of eating. Although current sensor is working, RD provided glucometer so she has a back up.  Pt reports last couple of BG readings: FBG 75, 187 yesterday  OneTouch Verio Flex Lot #: [id number] X Exp: 05/11/19 BG: 147 mg/dL  Diabetes Self-Management Education - 10/25/18 1424      Visit Information   Visit Type  Follow-up      Initial Visit   Diabetes Type  Type 2      Dietary Intake   Breakfast  grapes, peanuts    Snack (morning)  none OR tangerine    Lunch  [employer] Fish sandwich, sm fries, diet coke    Snack (afternoon)  none    Dinner  salmon, 1/2 potato, broccoli    Snack (evening)  peanuts    Beverage(s)  water, zero coke 1x week      Patient Education   Monitoring  Other (comment)   back up plan when Libre sensor isn't working     Individualized Goals (developed by patient)   Nutrition  General guidelines for healthy choices and portions discussed    Monitoring   test my blood glucose as discussed      Patient Self-Evaluation of Goals - Patient rates self as meeting previously set goals (% of time)   Nutrition  >75%    Physical Activity  >75%    Problem Solving  <  25%   having trouble with the equipment     Outcomes   Expected Outcomes  Demonstrated interest in learning. Expect positive outcomes    Future DMSE  PRN    Program Status  Not Completed      Subsequent Visit   Since your last visit have you continued or begun to take your medications as prescribed?  Yes    Since your last visit, are you checking your blood glucose at least once a day?  No       Individualized Plan for Diabetes Self-Management Training:   Learning Objective:  Patient will have a greater understanding of diabetes self-management. Patient education plan is to attend individual and/or group sessions per assessed needs and concerns.  Patient Instructions  Continue to use the Boost to help supplement your food Continue to eat balanced meals When your glucose sensor is not working, consider using the One Touch to test your blood sugar to help you feel more comfortable eating adequate meals and snacks. If you need more supplies, call your insurance company to see if they will cover it for the One Touch.   Expected Outcomes:  Demonstrated interest in learning. Expect positive outcomes  Education material provided: none  If problems or questions, patient to contact team via:  Phone  Future DSME appointment: PRN

## 2018-10-25 NOTE — Patient Instructions (Signed)
Continue to use the Boost to help supplement your food Continue to eat balanced meals When your glucose sensor is not working, consider using the One Touch to test your blood sugar to help you feel more comfortable eating adequate meals and snacks. If you need more supplies, call your insurance company to see if they will cover it for the One Touch.

## 2018-10-26 ENCOUNTER — Encounter: Payer: Self-pay | Admitting: Registered"

## 2018-10-26 DIAGNOSIS — E139 Other specified diabetes mellitus without complications: Secondary | ICD-10-CM | POA: Insufficient documentation

## 2018-10-26 DIAGNOSIS — E1165 Type 2 diabetes mellitus with hyperglycemia: Secondary | ICD-10-CM | POA: Insufficient documentation

## 2018-10-27 ENCOUNTER — Telehealth: Payer: Self-pay

## 2018-10-27 ENCOUNTER — Ambulatory Visit (INDEPENDENT_AMBULATORY_CARE_PROVIDER_SITE_OTHER): Payer: Medicare Other | Admitting: *Deleted

## 2018-10-27 DIAGNOSIS — I480 Paroxysmal atrial fibrillation: Secondary | ICD-10-CM | POA: Diagnosis not present

## 2018-10-27 DIAGNOSIS — Z7901 Long term (current) use of anticoagulants: Secondary | ICD-10-CM | POA: Diagnosis not present

## 2018-10-27 LAB — PROTIME-INR
INR: 8.1 (ref 0.8–1.2)
Prothrombin Time: 76.6 s — ABNORMAL HIGH (ref 9.1–12.0)

## 2018-10-27 LAB — POCT INR: INR: 7.1 — AB (ref 2.0–3.0)

## 2018-10-27 NOTE — Telephone Encounter (Signed)
Critical Lab:  PT--76.6 INR--8.1  [facility] aware of results and DODMargaretann Loveless, MD aware of results. Pt to be contacted by coumadin clinic

## 2018-10-27 NOTE — Patient Instructions (Signed)
Description   Telephoned pt and made her aware to Hold your coumadin until recheck on Monday. She is aware to seek immediate medical attention for any unusual bleeding or bruising and or injuries due to her elevated INR. Return to [facility] on Monday.

## 2018-10-31 ENCOUNTER — Ambulatory Visit (INDEPENDENT_AMBULATORY_CARE_PROVIDER_SITE_OTHER): Payer: Medicare Other | Admitting: Pharmacist Clinician (PhC)/ Clinical Pharmacy Specialist

## 2018-10-31 DIAGNOSIS — I480 Paroxysmal atrial fibrillation: Secondary | ICD-10-CM | POA: Diagnosis not present

## 2018-10-31 DIAGNOSIS — Z7901 Long term (current) use of anticoagulants: Secondary | ICD-10-CM

## 2018-10-31 LAB — POCT INR: INR: 2.1 (ref 2.0–3.0)

## 2018-10-31 NOTE — Patient Instructions (Addendum)
Take warfarin 7.5 mg (1.5 tablets) daily except 5 mg (1 tablet) each Friday.  Repeat INR in 2 weeks

## 2018-11-08 ENCOUNTER — Other Ambulatory Visit: Payer: Self-pay | Admitting: Internal Medicine

## 2018-11-08 ENCOUNTER — Other Ambulatory Visit: Payer: Self-pay | Admitting: *Deleted

## 2018-11-08 DIAGNOSIS — I4891 Unspecified atrial fibrillation: Secondary | ICD-10-CM

## 2018-11-08 MED ORDER — POTASSIUM CHLORIDE CRYS ER 20 MEQ PO TBCR: 20.0000 meq | EXTENDED_RELEASE_TABLET | Freq: Every day | ORAL | 1 refills | Status: DC

## 2018-11-09 ENCOUNTER — Other Ambulatory Visit: Payer: Self-pay | Admitting: Internal Medicine

## 2018-11-09 DIAGNOSIS — I4891 Unspecified atrial fibrillation: Secondary | ICD-10-CM

## 2018-11-09 NOTE — Telephone Encounter (Signed)
Rx request sent to pharmacy.  

## 2018-11-13 ENCOUNTER — Other Ambulatory Visit: Payer: Self-pay | Admitting: *Deleted

## 2018-11-13 ENCOUNTER — Ambulatory Visit (INDEPENDENT_AMBULATORY_CARE_PROVIDER_SITE_OTHER): Payer: Medicare Other | Admitting: *Deleted

## 2018-11-13 DIAGNOSIS — Z7901 Long term (current) use of anticoagulants: Secondary | ICD-10-CM | POA: Diagnosis not present

## 2018-11-13 DIAGNOSIS — I480 Paroxysmal atrial fibrillation: Secondary | ICD-10-CM | POA: Diagnosis not present

## 2018-11-13 LAB — POCT INR: INR: 2.6 (ref 2.0–3.0)

## 2018-11-13 MED ORDER — WARFARIN SODIUM 5 MG PO TABS: ORAL_TABLET | ORAL | 2 refills | Status: DC

## 2018-11-13 NOTE — Patient Instructions (Signed)
Description   Today and tomorrow take 2 tablets, then resume taking 1.5 tablets daily except for 1 tablet each Friday.  Repeat INR in 3 weeks

## 2018-12-08 ENCOUNTER — Ambulatory Visit (INDEPENDENT_AMBULATORY_CARE_PROVIDER_SITE_OTHER): Payer: Medicare Other | Admitting: *Deleted

## 2018-12-08 DIAGNOSIS — I480 Paroxysmal atrial fibrillation: Secondary | ICD-10-CM | POA: Diagnosis not present

## 2018-12-08 DIAGNOSIS — Z7901 Long term (current) use of anticoagulants: Secondary | ICD-10-CM

## 2018-12-08 LAB — POCT INR: INR: 5.4 — AB (ref 2.0–3.0)

## 2018-12-08 NOTE — Patient Instructions (Signed)
Description   Skip today and tomorrow's dose, then resume taking 1.5 tablets daily except for 1 tablet each Friday.  Repeat INR in 10 days.

## 2018-12-18 ENCOUNTER — Other Ambulatory Visit: Payer: Self-pay | Admitting: Internal Medicine

## 2018-12-18 ENCOUNTER — Ambulatory Visit (INDEPENDENT_AMBULATORY_CARE_PROVIDER_SITE_OTHER): Payer: Medicare Other | Admitting: *Deleted

## 2018-12-18 DIAGNOSIS — Z7901 Long term (current) use of anticoagulants: Secondary | ICD-10-CM

## 2018-12-18 DIAGNOSIS — I480 Paroxysmal atrial fibrillation: Secondary | ICD-10-CM

## 2018-12-18 LAB — POCT INR: INR: 4.9 — AB (ref 2.0–3.0)

## 2018-12-18 NOTE — Patient Instructions (Signed)
Description   Skip today and tomorrow's dose, then start taking  1.5 tablets daily except for 1 tablet on Mondays, Wednesdays and Fridays.  Repeat INR in 9 days.

## 2018-12-27 ENCOUNTER — Encounter: Payer: Medicare Other | Admitting: Pharmacist Clinician (PhC)/ Clinical Pharmacy Specialist

## 2019-01-10 NOTE — Progress Notes (Signed)
This encounter was created in error - please disregard.

## 2019-01-25 ENCOUNTER — Telehealth: Payer: Self-pay | Admitting: Pharmacist

## 2019-01-25 ENCOUNTER — Ambulatory Visit (INDEPENDENT_AMBULATORY_CARE_PROVIDER_SITE_OTHER): Payer: Medicare Other | Admitting: Pharmacist

## 2019-01-25 ENCOUNTER — Other Ambulatory Visit: Payer: Self-pay

## 2019-01-25 DIAGNOSIS — Z7901 Long term (current) use of anticoagulants: Secondary | ICD-10-CM

## 2019-01-25 DIAGNOSIS — I480 Paroxysmal atrial fibrillation: Secondary | ICD-10-CM | POA: Diagnosis not present

## 2019-01-25 LAB — POCT INR: INR: 1.7 — AB (ref 2.0–3.0)

## 2019-01-25 NOTE — Telephone Encounter (Signed)

## 2019-01-28 ENCOUNTER — Other Ambulatory Visit: Payer: Self-pay | Admitting: Internal Medicine

## 2019-02-07 ENCOUNTER — Telehealth: Payer: Self-pay

## 2019-02-07 NOTE — Telephone Encounter (Signed)

## 2019-02-08 ENCOUNTER — Other Ambulatory Visit: Payer: Self-pay

## 2019-02-08 ENCOUNTER — Ambulatory Visit (INDEPENDENT_AMBULATORY_CARE_PROVIDER_SITE_OTHER): Payer: Medicare Other | Admitting: Pharmacist

## 2019-02-08 DIAGNOSIS — Z7901 Long term (current) use of anticoagulants: Secondary | ICD-10-CM

## 2019-02-08 DIAGNOSIS — I480 Paroxysmal atrial fibrillation: Secondary | ICD-10-CM

## 2019-02-08 LAB — POCT INR: INR: 1.4 — AB (ref 2.0–3.0)

## 2019-02-08 MED ORDER — APIXABAN 5 MG PO TABS: 5.0000 mg | ORAL_TABLET | Freq: Two times a day (BID) | ORAL | 5 refills | Status: DC

## 2019-05-07 ENCOUNTER — Other Ambulatory Visit: Payer: Self-pay | Admitting: Internal Medicine

## 2019-07-11 ENCOUNTER — Other Ambulatory Visit: Payer: Self-pay

## 2019-07-11 LAB — COMPREHENSIVE METABOLIC PANEL
ALT: 41 IU/L — ABNORMAL HIGH (ref 0–32)
AST: 36 IU/L (ref 0–40)
Albumin/Globulin Ratio: 1.6 (ref 1.2–2.2)
Albumin: 4.2 g/dL (ref 3.6–4.6)
Alkaline Phosphatase: 88 IU/L (ref 39–117)
BUN/Creatinine Ratio: 17 (ref 12–28)
BUN: 22 mg/dL (ref 8–27)
Bilirubin Total: 0.8 mg/dL (ref 0.0–1.2)
CO2: 22 mmol/L (ref 20–29)
Calcium: 9.4 mg/dL (ref 8.7–10.3)
Chloride: 98 mmol/L (ref 96–106)
Creatinine, Ser: 1.28 mg/dL — ABNORMAL HIGH (ref 0.57–1.00)
GFR calc Af Amer: 45 mL/min/{1.73_m2} — ABNORMAL LOW (ref >59)
GFR calc non Af Amer: 39 mL/min/{1.73_m2} — ABNORMAL LOW (ref >59)
Globulin, Total: 2.6 g/dL (ref 1.5–4.5)
Glucose: 275 mg/dL — ABNORMAL HIGH (ref 65–99)
Potassium: 4.4 mmol/L (ref 3.5–5.2)
Sodium: 135 mmol/L (ref 134–144)
Total Protein: 6.8 g/dL (ref 6.0–8.5)

## 2019-07-12 NOTE — Progress Notes (Signed)
Can not close encounter

## 2019-07-16 ENCOUNTER — Other Ambulatory Visit: Payer: Self-pay | Admitting: Internal Medicine

## 2019-07-19 ENCOUNTER — Other Ambulatory Visit: Payer: Self-pay | Admitting: Internal Medicine

## 2019-07-19 DIAGNOSIS — I4891 Unspecified atrial fibrillation: Secondary | ICD-10-CM

## 2019-08-17 ENCOUNTER — Other Ambulatory Visit: Payer: Self-pay | Admitting: Internal Medicine

## 2019-08-21 ENCOUNTER — Other Ambulatory Visit: Payer: Self-pay | Admitting: Internal Medicine

## 2019-08-29 ENCOUNTER — Other Ambulatory Visit: Payer: Self-pay

## 2019-08-29 ENCOUNTER — Encounter: Payer: Self-pay | Admitting: Internal Medicine

## 2019-08-29 ENCOUNTER — Ambulatory Visit (INDEPENDENT_AMBULATORY_CARE_PROVIDER_SITE_OTHER): Payer: Medicare Other | Admitting: Internal Medicine

## 2019-08-29 VITALS — BP 134/78 | HR 70 | Ht 62.0 in | Wt 151.8 lb

## 2019-08-29 DIAGNOSIS — I10 Essential (primary) hypertension: Secondary | ICD-10-CM | POA: Diagnosis not present

## 2019-08-29 DIAGNOSIS — I4891 Unspecified atrial fibrillation: Secondary | ICD-10-CM

## 2019-08-29 DIAGNOSIS — E7800 Pure hypercholesterolemia, unspecified: Secondary | ICD-10-CM

## 2019-08-29 MED ORDER — ATORVASTATIN CALCIUM 40 MG PO TABS: 40.0000 mg | ORAL_TABLET | Freq: Every day | ORAL | 3 refills | Status: DC

## 2019-08-29 NOTE — Patient Instructions (Signed)
Medication Instructions:  Your physician recommends that you continue on your current medications as directed. Please refer to the Current Medication list given to you today.  *If you need a refill on your cardiac medications before your next appointment, please call your pharmacy*    Follow-Up: At [facility], you and your health needs are our priority.  As part of our continuing mission to provide you with exceptional heart care, we have created designated Provider Care Teams.  These Care Teams include your primary Cardiologist (physician) and Advanced Practice Providers (APPs -  Physician Assistants and Nurse Practitioners) who all work together to provide you with the care you need, when you need it.  Your next appointment:   12 month(s)  The format for your next appointment:   Either In Person or Virtual  Provider:   You may see Dr. Debara Pickett or one of the following Advanced Practice Providers on your designated Care Team:    Almyra Deforest, PA-C  Fabian Sharp, [place] or   Roby Lofts, [place]   Other Instructions

## 2019-08-29 NOTE — Progress Notes (Signed)
OFFICE NOTE  Chief Complaint:  Routine follow-up  Primary Care Physician: Deland Pretty, MD  HPI:  [name] is a 84 year old overweight African American female with a history of paroxysmal atrial fibrillation on Coumadin, hypothyroidism, dyslipidemia, diabetes mellitus type 2, hypertension.  She had an episode of PAF in 2014, but this went away fairly quickly and she is not been symptomatic since then. This may be related to her not taking her medications as scheduled. Since then she's been taking her metoprolol twice daily and has had no real problems. Her warfarin level remains therapeutic and well controlled. Over the past several months she's had a problem with breakthrough paroxysmal atrial fibrillation.  In fact at this point she seems to be persistent.  She saw 2 other physician assistants in our office to were able to get her rate controlled. Once she was at a normal ventricular rate, she felt fairly good. In fact she cannot really tell difference when she is in A. fib versus when she is in sinus rhythm.  Previously her episode was possibly related to thyroid and balance, however her thyroid levels have been well controlled as has her diabetes.   [name] returns today for follow-up. Her EKG shows sinus rhythm. It is notable that she is bradycardic today with heart rate in the upper 40s. INR was checked in the office today and is low at 1.7. This will be adjusted by Erasmo Downer, our anticoagulation pharmacist. She denies any complaints such as shortness of breath, increasing fatigue, presyncope or syncopal symptoms. She also denies any chest pain or palpitations and is unaware of any recurrent atrial fibrillation. She's recently been started on an injectable insulin in addition to her oral Glucophage for diabetes. Her cholesterol is also managed by her primary care provider.  07/15/2016  [name] returns today for follow-up. She's been followed monthly for her INRs which been  therapeutic. She reports feeling well denies any chest pain or recurrent atrial fibrillation. EKG shows sinus rhythm. Overall she feels that she is doing well. Blood prssure was mildly elevated today recently was 122/60 in the office.  08/18/2017  [name] returns today for follow-up.  She was last seen about a year ago.  Overall she seems to be doing okay but she is slowing down.  She says she cannot work quite as much as she could previously.  Although she has a history of atrial fibrillation, recently she has had no recurrence.  Today however she is noted to be in A. fib with RVR and heart rate of 123.  He denies any chest pain or worsening shortness of breath with this.  03/15/2018  [name] was seen today in follow-up.  Overall she is feeling well.  Her INR was therapeutic today.  Blood pressure is well controlled.  A. fib rate is slightly fast at 101.  She is asymptomatic with this.  She reports good energy level except in the mornings where she is somewhat fatigued.  Hemoglobin A1c recently in December was 9.9 however she has had medication adjustment and lost about 10 pounds since then.  The dietary changes should have helped her.  In December her cholesterol was very elevated as well with total cholesterol 261, HDL 57, LDL 173 and triglycerides 153.  We discussed her diet today and she reports she still eats out almost every day.  She has been going to a fish restaurant and eating fried fish several times a week which likely is raising her saturated fats  intake.  I we discussed healthier options she can choose however she is unlikely to meet her LDL goal less than 70.  08/29/2019  [name] returns today for follow-up.  Overall she is doing well.  Recent labs showed improvement in her dyslipidemia.  Her total cholesterol is now 90, HDL 42, LDL 35 and triglycerides 65 on 40 mg atorvastatin.  She does report some improvement in her hemoglobin A1c.  She remains in stable rate controlled A.  fib.  She has no bleeding problems on Eliquis.  She denies any shortness of breath or chest pain.  She is active.  PMHx:  Past Medical History:  Diagnosis Date  . Colon polyp   . Diabetes mellitus   . Hyperlipidemia   . Hypertension   . Poor appetite   . Thyroid disease     Past Surgical History:  Procedure Laterality Date  . BREAST EXCISIONAL BIOPSY Right   . CARDIOVASCULAR STRESS TEST  07/15/2006   Normal scan, no ECG changes  . TRANSTHORACIC ECHOCARDIOGRAM  01/08/2011   EF 60-65%, moderae mitral regurg, LA mild-moderately dilated,    FAMHx:  Family History  Problem Relation Age of Onset  . Diabetes Mother   . Hypertension Father   . Hyperlipidemia Father   . Diabetes Sister   . Hypertension Sister   . Hyperlipidemia Sister   . Hypertension Brother   . Stroke Sister   . Hypertension Sister   . Diabetes Sister   . Cancer Daughter   . Heart failure Child   . Breast cancer Neg Hx     SOCHx:   reports that she quit smoking about 16 years ago. Her smoking use included cigarettes. She has never used smokeless tobacco. She reports that she does not drink alcohol or use drugs.  ALLERGIES:  No Known Allergies  ROS: Pertinent items noted in HPI and remainder of comprehensive ROS otherwise negative.  HOME MEDS: Current Outpatient Medications  Medication Sig Dispense Refill  . apixaban (ELIQUIS) 5 MG TABS tablet Take 1 tablet (5 mg total) by mouth 2 (two) times daily. 60 tablet 5  . atorvastatin (LIPITOR) 40 MG tablet TAKE 1 TABLET (40 MG TOTAL) BY MOUTH DAILY AT 6 PM. OFFICE VISIT NEEDED 90 tablet 0  . Cholecalciferol (VITAMIN D3) 10 MCG (400 UNIT) tablet Take 400 Units by mouth daily.    [name] diltiazem (CARDIZEM CD) 180 MG 24 hr capsule TAKE 1 CAPSULE BY MOUTH EVERY DAY 90 capsule 0  . furosemide (LASIX) 20 MG tablet Take 1 tablet (20 mg total) by mouth as needed for edema. 30 tablet 0  . Insulin Glargine (TOUJEO SOLOSTAR [place]) Inject 12 Units into the skin.     [name]  levothyroxine (SYNTHROID, LEVOTHROID) 100 MCG tablet Take 100 mcg by mouth daily before breakfast.    . magnesium oxide (MAG-OX) 400 MG tablet Take 400 mg by mouth daily.    . metoprolol succinate (TOPROL-XL) 50 MG 24 hr tablet TAKE 1 TABLET BY MOUTH TWICE A DAY 180 tablet 0  . potassium chloride SA (KLOR-CON M20) 20 MEQ tablet Take 1 tablet (20 mEq total) by mouth daily. 90 tablet 1  . sitaGLIPtin (JANUVIA) 100 MG tablet Take 1 tablet (100 mg total) by mouth daily.    [name] triamterene-hydrochlorothiazide (MAXZIDE-25) 37.5-25 MG tablet Take 1 tablet by mouth every morning.     No current facility-administered medications for this visit.     LABS/IMAGING: No results found for this or any previous visit (from the past  48 hour(s)). No results found.  VITALS: BP 134/78   Pulse 70   Ht 5\' 2"  (1.575 m)   Wt 151 lb 12.8 oz (68.9 kg)   SpO2 97%   BMI 27.76 kg/m   EXAM: General appearance: alert and no distress Neck: no carotid bruit, no JVD and thyroid not enlarged, symmetric, no tenderness/mass/nodules Lungs: clear to auscultation bilaterally Heart: irregularly irregular rhythm and Tachycardic Abdomen: soft, non-tender; bowel sounds normal; no masses,  no organomegaly Extremities: extremities normal, atraumatic, no cyanosis or edema Pulses: 2+ and symmetric Skin: Skin color, texture, turgor normal. No rashes or lesions Neurologic: Grossly normal Psych: Pleasant  EKG: A. fib at 81, incomplete right bundle branch block, lateral ST and T wave changes-personally reviewed  ASSESSMENT: 1. Longstanding persistent A. Fib - CHADSVASC score of 4 on warfarin 2. Hypertension 3. Diabetes type 2  4. Hypothyroidism 5. Dyslipidemia  PLAN: 1.   [name] continues to do well and remains in longstanding persistent if not permanent A. Fib.  She is tolerating Eliquis without bleeding issues.  Her blood pressures well controlled.  Diabetes has improved.  Her cholesterol is a target with LDL less  than 70.  No changes in her medicines today.  Follow-up with me annually or sooner as necessary.  Karen Casino, MD, [facility], [place] Director of the [facility]  Card[facility] Diplomate of the American Board of Clinical Lipidology Attending Cardiologist  Direct Dial: [phone]  Fax: [phone]  Website:  [email or web address] Arave 08/29/2019, 10:42 AM

## 2019-09-13 ENCOUNTER — Other Ambulatory Visit: Payer: Self-pay

## 2019-09-13 DIAGNOSIS — Q51820 Cervical duplication: Secondary | ICD-10-CM

## 2019-09-14 ENCOUNTER — Telehealth: Payer: Self-pay | Admitting: *Deleted

## 2019-09-14 NOTE — Telephone Encounter (Signed)
Patient called and signed up for my chart .

## 2019-09-15 LAB — NOVEL CORONAVIRUS, NAA: SARS-CoV-2, NAA: NOT DETECTED

## 2019-11-30 ENCOUNTER — Other Ambulatory Visit: Payer: Self-pay | Admitting: Internal Medicine

## 2019-11-30 DIAGNOSIS — Z1231 Encounter for screening mammogram for malignant neoplasm of breast: Secondary | ICD-10-CM

## 2019-12-05 ENCOUNTER — Other Ambulatory Visit: Payer: Self-pay | Admitting: Internal Medicine

## 2020-01-16 DIAGNOSIS — E139 Other specified diabetes mellitus without complications: Secondary | ICD-10-CM | POA: Diagnosis not present

## 2020-01-16 DIAGNOSIS — E785 Hyperlipidemia, unspecified: Secondary | ICD-10-CM | POA: Diagnosis not present

## 2020-01-16 DIAGNOSIS — E118 Type 2 diabetes mellitus with unspecified complications: Secondary | ICD-10-CM | POA: Diagnosis not present

## 2020-01-16 DIAGNOSIS — N189 Chronic kidney disease, unspecified: Secondary | ICD-10-CM | POA: Diagnosis not present

## 2020-01-16 DIAGNOSIS — E039 Hypothyroidism, unspecified: Secondary | ICD-10-CM | POA: Diagnosis not present

## 2020-01-30 ENCOUNTER — Ambulatory Visit: Payer: Medicare Other

## 2020-02-02 ENCOUNTER — Other Ambulatory Visit: Payer: Self-pay | Admitting: Internal Medicine

## 2020-02-13 ENCOUNTER — Ambulatory Visit: Payer: Medicare PPO

## 2020-02-28 DIAGNOSIS — E039 Hypothyroidism, unspecified: Secondary | ICD-10-CM | POA: Diagnosis not present

## 2020-02-28 DIAGNOSIS — N189 Chronic kidney disease, unspecified: Secondary | ICD-10-CM | POA: Diagnosis not present

## 2020-02-28 DIAGNOSIS — E785 Hyperlipidemia, unspecified: Secondary | ICD-10-CM | POA: Diagnosis not present

## 2020-02-28 DIAGNOSIS — E139 Other specified diabetes mellitus without complications: Secondary | ICD-10-CM | POA: Diagnosis not present

## 2020-03-22 ENCOUNTER — Encounter (HOSPITAL_BASED_OUTPATIENT_CLINIC_OR_DEPARTMENT_OTHER): Payer: Self-pay | Admitting: Emergency Medicine

## 2020-03-22 ENCOUNTER — Observation Stay (HOSPITAL_COMMUNITY)
Admission: EM | Admit: 2020-03-22 | Discharge: 2020-03-23 | Disposition: A | Discharge status: Home / Self Care | Payer: Medicare PPO | Attending: Internal Medicine | Admitting: Internal Medicine

## 2020-03-22 ENCOUNTER — Other Ambulatory Visit: Payer: Self-pay

## 2020-03-22 ENCOUNTER — Encounter (HOSPITAL_COMMUNITY): Payer: Self-pay

## 2020-03-22 ENCOUNTER — Emergency Department (HOSPITAL_COMMUNITY): Payer: Medicare PPO

## 2020-03-22 ENCOUNTER — Emergency Department (HOSPITAL_BASED_OUTPATIENT_CLINIC_OR_DEPARTMENT_OTHER)
Admission: EM | Admit: 2020-03-22 | Discharge: 2020-03-22 | Disposition: A | Discharge status: Home / Self Care | Payer: Medicare PPO | Source: Home / Self Care | Attending: Emergency Medicine | Admitting: Emergency Medicine

## 2020-03-22 DIAGNOSIS — Z03818 Encounter for observation for suspected exposure to other biological agents ruled out: Secondary | ICD-10-CM | POA: Diagnosis not present

## 2020-03-22 DIAGNOSIS — Z20822 Contact with and (suspected) exposure to covid-19: Secondary | ICD-10-CM | POA: Insufficient documentation

## 2020-03-22 DIAGNOSIS — Z8349 Family history of other endocrine, nutritional and metabolic diseases: Secondary | ICD-10-CM | POA: Insufficient documentation

## 2020-03-22 DIAGNOSIS — I491 Atrial premature depolarization: Secondary | ICD-10-CM | POA: Diagnosis not present

## 2020-03-22 DIAGNOSIS — Z79899 Other long term (current) drug therapy: Secondary | ICD-10-CM | POA: Insufficient documentation

## 2020-03-22 DIAGNOSIS — E1165 Type 2 diabetes mellitus with hyperglycemia: Principal | ICD-10-CM | POA: Insufficient documentation

## 2020-03-22 DIAGNOSIS — Z87891 Personal history of nicotine dependence: Secondary | ICD-10-CM | POA: Diagnosis not present

## 2020-03-22 DIAGNOSIS — Z79890 Hormone replacement therapy (postmenopausal): Secondary | ICD-10-CM | POA: Insufficient documentation

## 2020-03-22 DIAGNOSIS — E139 Other specified diabetes mellitus without complications: Secondary | ICD-10-CM | POA: Diagnosis present

## 2020-03-22 DIAGNOSIS — E785 Hyperlipidemia, unspecified: Secondary | ICD-10-CM | POA: Insufficient documentation

## 2020-03-22 DIAGNOSIS — R000 Tachycardia, unspecified: Secondary | ICD-10-CM | POA: Diagnosis not present

## 2020-03-22 DIAGNOSIS — Z7901 Long term (current) use of anticoagulants: Secondary | ICD-10-CM | POA: Insufficient documentation

## 2020-03-22 DIAGNOSIS — I10 Essential (primary) hypertension: Secondary | ICD-10-CM | POA: Insufficient documentation

## 2020-03-22 DIAGNOSIS — I4891 Unspecified atrial fibrillation: Secondary | ICD-10-CM | POA: Diagnosis not present

## 2020-03-22 DIAGNOSIS — E039 Hypothyroidism, unspecified: Secondary | ICD-10-CM | POA: Diagnosis present

## 2020-03-22 DIAGNOSIS — I480 Paroxysmal atrial fibrillation: Secondary | ICD-10-CM | POA: Insufficient documentation

## 2020-03-22 DIAGNOSIS — R739 Hyperglycemia, unspecified: Secondary | ICD-10-CM | POA: Diagnosis present

## 2020-03-22 DIAGNOSIS — Z794 Long term (current) use of insulin: Secondary | ICD-10-CM | POA: Insufficient documentation

## 2020-03-22 DIAGNOSIS — R55 Syncope and collapse: Secondary | ICD-10-CM | POA: Diagnosis not present

## 2020-03-22 DIAGNOSIS — Z833 Family history of diabetes mellitus: Secondary | ICD-10-CM | POA: Insufficient documentation

## 2020-03-22 DIAGNOSIS — Z8249 Family history of ischemic heart disease and other diseases of the circulatory system: Secondary | ICD-10-CM | POA: Insufficient documentation

## 2020-03-22 DIAGNOSIS — R350 Frequency of micturition: Secondary | ICD-10-CM | POA: Insufficient documentation

## 2020-03-22 DIAGNOSIS — E7800 Pure hypercholesterolemia, unspecified: Secondary | ICD-10-CM | POA: Insufficient documentation

## 2020-03-22 LAB — COMPREHENSIVE METABOLIC PANEL
ALT: 30 U/L (ref 0–44)
ALT: 30 U/L (ref 0–44)
AST: 28 U/L (ref 15–41)
AST: 31 U/L (ref 15–41)
Albumin: 4.2 g/dL (ref 3.5–5.0)
Albumin: 4.1 g/dL (ref 3.5–5.0)
Alkaline Phosphatase: 82 U/L (ref 38–126)
Alkaline Phosphatase: 83 U/L (ref 38–126)
Anion gap: 14 (ref 5–15)
Anion gap: 14 (ref 5–15)
BUN: 29 mg/dL — ABNORMAL HIGH (ref 8–23)
BUN: 26 mg/dL — ABNORMAL HIGH (ref 8–23)
CO2: 27 mmol/L (ref 22–32)
CO2: 23 mmol/L (ref 22–32)
Calcium: 9.7 mg/dL (ref 8.9–10.3)
Calcium: 9.7 mg/dL (ref 8.9–10.3)
Chloride: 90 mmol/L — ABNORMAL LOW (ref 98–111)
Chloride: 94 mmol/L — ABNORMAL LOW (ref 98–111)
Creatinine, Ser: 1.32 mg/dL — ABNORMAL HIGH (ref 0.44–1.00)
Creatinine, Ser: 1.22 mg/dL — ABNORMAL HIGH (ref 0.44–1.00)
GFR calc Af Amer: 43 mL/min — ABNORMAL LOW (ref >60)
GFR calc Af Amer: 48 mL/min — ABNORMAL LOW (ref >60)
GFR calc non Af Amer: 37 mL/min — ABNORMAL LOW (ref >60)
GFR calc non Af Amer: 41 mL/min — ABNORMAL LOW (ref >60)
Glucose, Bld: 662 mg/dL (ref 70–99)
Glucose, Bld: 369 mg/dL — ABNORMAL HIGH (ref 70–99)
Potassium: 3.8 mmol/L (ref 3.5–5.1)
Potassium: 3.4 mmol/L — ABNORMAL LOW (ref 3.5–5.1)
Sodium: 131 mmol/L — ABNORMAL LOW (ref 135–145)
Sodium: 131 mmol/L — ABNORMAL LOW (ref 135–145)
Total Bilirubin: 1 mg/dL (ref 0.3–1.2)
Total Bilirubin: 0.8 mg/dL (ref 0.3–1.2)
Total Protein: 7.9 g/dL (ref 6.5–8.1)
Total Protein: 8.2 g/dL — ABNORMAL HIGH (ref 6.5–8.1)

## 2020-03-22 LAB — CBC WITH DIFFERENTIAL/PLATELET
Abs Immature Granulocytes: 0.01 10*3/uL (ref 0.00–0.07)
Abs Immature Granulocytes: 0.01 10*3/uL (ref 0.00–0.07)
Basophils Absolute: 0.1 10*3/uL (ref 0.0–0.1)
Basophils Absolute: 0.1 10*3/uL (ref 0.0–0.1)
Basophils Relative: 1 %
Basophils Relative: 1 %
Eosinophils Absolute: 0.1 10*3/uL (ref 0.0–0.5)
Eosinophils Absolute: 0.1 10*3/uL (ref 0.0–0.5)
Eosinophils Relative: 1 %
Eosinophils Relative: 2 %
HCT: 44.8 % (ref 36.0–46.0)
HCT: 46.1 % — ABNORMAL HIGH (ref 36.0–46.0)
Hemoglobin: 14.7 g/dL (ref 12.0–15.0)
Hemoglobin: 15 g/dL (ref 12.0–15.0)
Immature Granulocytes: 0 %
Immature Granulocytes: 0 %
Lymphocytes Relative: 30 %
Lymphocytes Relative: 27 %
Lymphs Abs: 1.6 10*3/uL (ref 0.7–4.0)
Lymphs Abs: 1.7 10*3/uL (ref 0.7–4.0)
MCH: 27.8 pg (ref 26.0–34.0)
MCH: 28 pg (ref 26.0–34.0)
MCHC: 32.8 g/dL (ref 30.0–36.0)
MCHC: 32.5 g/dL (ref 30.0–36.0)
MCV: 84.8 fL (ref 80.0–100.0)
MCV: 86 fL (ref 80.0–100.0)
Monocytes Absolute: 0.5 10*3/uL (ref 0.1–1.0)
Monocytes Absolute: 0.6 10*3/uL (ref 0.1–1.0)
Monocytes Relative: 9 %
Monocytes Relative: 10 %
Neutro Abs: 3.1 10*3/uL (ref 1.7–7.7)
Neutro Abs: 3.6 10*3/uL (ref 1.7–7.7)
Neutrophils Relative %: 59 %
Neutrophils Relative %: 60 %
Platelets: 169 10*3/uL (ref 150–400)
Platelets: 179 10*3/uL (ref 150–400)
RBC: 5.28 MIL/uL — ABNORMAL HIGH (ref 3.87–5.11)
RBC: 5.36 MIL/uL — ABNORMAL HIGH (ref 3.87–5.11)
RDW: 14 % (ref 11.5–15.5)
RDW: 14.2 % (ref 11.5–15.5)
WBC: 5.3 10*3/uL (ref 4.0–10.5)
WBC: 6.1 10*3/uL (ref 4.0–10.5)
nRBC: 0 % (ref 0.0–0.2)
nRBC: 0 % (ref 0.0–0.2)

## 2020-03-22 LAB — URINALYSIS, MICROSCOPIC (REFLEX)

## 2020-03-22 LAB — URINALYSIS, ROUTINE W REFLEX MICROSCOPIC
Bilirubin Urine: NEGATIVE
Glucose, UA: >=500 mg/dL — AB
Hgb urine dipstick: NEGATIVE
Ketones, ur: NEGATIVE mg/dL
Leukocytes,Ua: NEGATIVE
Nitrite: NEGATIVE
Protein, ur: NEGATIVE mg/dL
Specific Gravity, Urine: 1.01 (ref 1.005–1.030)
pH: 6.5 (ref 5.0–8.0)

## 2020-03-22 LAB — CBG MONITORING, ED
Glucose-Capillary: >600 mg/dL (ref 70–99)
Glucose-Capillary: 494 mg/dL — ABNORMAL HIGH (ref 70–99)
Glucose-Capillary: 499 mg/dL — ABNORMAL HIGH (ref 70–99)
Glucose-Capillary: 365 mg/dL — ABNORMAL HIGH (ref 70–99)

## 2020-03-22 LAB — SARS CORONAVIRUS 2 BY RT PCR (HOSPITAL ORDER, PERFORMED IN ~~LOC~~ HOSPITAL LAB): SARS Coronavirus 2: NEGATIVE

## 2020-03-22 LAB — TROPONIN I (HIGH SENSITIVITY): Troponin I (High Sensitivity): 23 ng/L — ABNORMAL HIGH (ref <18)

## 2020-03-22 MED ORDER — SODIUM CHLORIDE 0.9 % IV BOLUS
1000.0000 mL | Freq: Once | INTRAVENOUS | Status: AC
  Administered 2020-03-22: 1000 mL via INTRAVENOUS

## 2020-03-22 MED ORDER — INSULIN ASPART 100 UNIT/ML ~~LOC~~ SOLN
10.0000 [IU] | Freq: Once | SUBCUTANEOUS | Status: AC
  Administered 2020-03-22: 10 [IU] via INTRAVENOUS
  Filled 2020-03-22: qty 10

## 2020-03-22 MED ORDER — INSULIN REGULAR HUMAN 100 UNIT/ML IJ SOLN
10.0000 [IU] | Freq: Once | INTRAMUSCULAR | Status: DC
  Filled 2020-03-22: qty 3

## 2020-03-22 NOTE — ED Provider Notes (Signed)
[facility] EMERGENCY DEPARTMENT Provider Note   CSN: [id number] Arrival date & time: 03/22/20  1556     History Chief Complaint  Patient presents with   Hyperglycemia    [name] is a 85 y.o. female with past medical history significant for type 2 diabetes on insulin, afib on eliquis, hypertension, hyperlipidemia, thyroid disease.  HPI Patient is presenting to the emergency department accompanied by her daughter with chief complaint of hyperglycemia. Patient checks her glucose frequently with a free style sensor and for the last x 3 days the meter has been reading "high." Patient is also endorsing urinary frequency x 1 week. She admits to medication compliance. Patient lives alone with family checking in on her daily, she is very active and still working 2 jobs. Her daughter is worried patient is not patient is not eating healthy foods. She denies any recent illness. Patient denies being in any pain. She denies fever, chills, chest pain, syncope, palpitations, dysuria, diarrhea. No sick contacts.    Past Medical History:  Diagnosis Date   Colon polyp    Diabetes mellitus    Hyperlipidemia    Hypertension    Poor appetite    Thyroid disease     Patient Active Problem List   Diagnosis Date Noted   Uncontrolled type 2 diabetes mellitus with hyperglycemia ([place]) 10/26/2018   Hypercholesterolemia 03/15/2018   Atrial fibrillation with RVR ([place]) 08/18/2017   Left hip pain 06/07/2017   Hypothyroidism 02/03/2015   Essential hypertension 07/10/2013   Type 2 diabetes mellitus ([place]) 07/10/2013   Upper respiratory infection 07/10/2013   PAF (paroxysmal atrial fibrillation) ([place]) 12/26/2012   Postop check 04/13/2011    Past Surgical History:  Procedure Laterality Date   BREAST EXCISIONAL BIOPSY Right    CARDIOVASCULAR STRESS TEST  07/15/2006   Normal scan, no ECG changes   TRANSTHORACIC ECHOCARDIOGRAM  01/08/2011   EF 60-65%, moderae mitral regurg,  LA mild-moderately dilated,     OB History   No obstetric history on file.     Family History  Problem Relation Age of Onset   Diabetes Mother    Hypertension Father    Hyperlipidemia Father    Diabetes Sister    Hypertension Sister    Hyperlipidemia Sister    Hypertension Brother    Stroke Sister    Hypertension Sister    Diabetes Sister    Cancer Daughter    Heart failure Child    Breast cancer Neg Hx     Social History   Tobacco Use   Smoking status: Former Smoker    Types: Cigarettes    Quit date: 05/18/2003    Years since quitting: 16.8   Smokeless tobacco: Never Used  Scientific laboratory technician Use: Never used  Substance Use Topics   Alcohol use: No   Drug use: No    Home Medications Prior to Admission medications   Medication Sig Start Date End Date Taking? Authorizing Provider  apixaban (ELIQUIS) 5 MG TABS tablet Take 1 tablet (5 mg total) by mouth 2 (two) times daily. 02/08/19   Hilty, Nadean Corwin, MD  atorvastatin (LIPITOR) 40 MG tablet Take 1 tablet (40 mg total) by mouth daily at 6 PM. 08/29/19   Hilty, Nadean Corwin, MD  Cholecalciferol (VITAMIN D3) 10 MCG (400 UNIT) tablet Take 400 Units by mouth daily.    [provider]  diltiazem (CARDIZEM CD) 180 MG 24 hr capsule TAKE 1 CAPSULE BY MOUTH EVERY DAY 07/19/19   Hilty,  Nadean Corwin, MD  furosemide (LASIX) 20 MG tablet Take 1 tablet (20 mg total) by mouth as needed for edema. 09/13/18   Hilty, Nadean Corwin, MD  Insulin Glargine (TOUJEO SOLOSTAR [place]) Inject 12 Units into the skin.     [provider]  levothyroxine (SYNTHROID, LEVOTHROID) 100 MCG tablet Take 100 mcg by mouth daily before breakfast.    [provider]  magnesium oxide (MAG-OX) 400 MG tablet Take 400 mg by mouth daily.    [provider]  metoprolol succinate (TOPROL-XL) 50 MG 24 hr tablet TAKE 1 TABLET BY MOUTH TWICE A DAY 12/05/19   Hilty, Nadean Corwin, MD  potassium chloride SA (KLOR-CON M20) 20 MEQ tablet Take  1 tablet (20 mEq total) by mouth daily. 11/08/18   Hilty, Nadean Corwin, MD  sitaGLIPtin (JANUVIA) 100 MG tablet Take 1 tablet (100 mg total) by mouth daily. 11/10/17   Lendon Colonel, NP  triamterene-hydrochlorothiazide (MAXZIDE-25) 37.5-25 MG tablet Take 1 tablet by mouth every morning. 07/12/19   [provider]    Allergies    Patient has no known allergies.  Review of Systems   Review of Systems  All other systems are reviewed and are negative for acute change except as noted in the HPI.   Physical Exam Updated Vital Signs BP 131/83 (BP Location: Left Arm)    Pulse 88    Temp 98.3 F (36.8 C) (Oral)    Resp 18    Ht 5\' 2"  (1.575 m)    Wt 58.6 kg    SpO2 97%    BMI 23.63 kg/m   Physical Exam Vitals and nursing note reviewed.  Constitutional:      General: She is not in acute distress.    Appearance: She is not ill-appearing.  HENT:     Head: Normocephalic and atraumatic.     Right Ear: Tympanic membrane and external ear normal.     Left Ear: Tympanic membrane and external ear normal.     Nose: Nose normal.     Mouth/Throat:     Mouth: Mucous membranes are moist.     Pharynx: Oropharynx is clear.  Eyes:     General: No scleral icterus.       Right eye: No discharge.        Left eye: No discharge.     Extraocular Movements: Extraocular movements intact.     Conjunctiva/sclera: Conjunctivae normal.     Pupils: Pupils are equal, round, and reactive to light.  Neck:     Vascular: No JVD.  Cardiovascular:     Rate and Rhythm: Normal rate and regular rhythm.     Pulses: Normal pulses.          Radial pulses are 2+ on the right side and 2+ on the left side.     Heart sounds: Normal heart sounds.  Pulmonary:     Comments: Lungs clear to auscultation in all fields. Symmetric chest rise. No wheezing, rales, or rhonchi. Abdominal:     Comments: Abdomen is soft, non-distended, and non-tender in all quadrants. No rigidity, no guarding. No peritoneal signs.    Musculoskeletal:        General: Normal range of motion.     Cervical back: Normal range of motion.  Skin:    General: Skin is warm and dry.     Capillary Refill: Capillary refill takes less than 2 seconds.  Neurological:     Mental Status: She is oriented to person, place, and time.  GCS: GCS eye subscore is 4. GCS verbal subscore is 5. GCS motor subscore is 6.     Comments: Fluent speech, no facial droop.  Psychiatric:        Behavior: Behavior normal.     ED Results / Procedures / Treatments   Labs (all labs ordered are listed, but only abnormal results are displayed) Labs Reviewed  COMPREHENSIVE METABOLIC PANEL - Abnormal; Notable for the following components:      Result Value   Sodium 131 (*)    Chloride 90 (*)    Glucose, Bld 662 (*)    BUN 29 (*)    Creatinine, Ser 1.32 (*)    GFR calc non Af Amer 37 (*)    GFR calc Af Amer 43 (*)    All other components within normal limits  CBC WITH DIFFERENTIAL/PLATELET - Abnormal; Notable for the following components:   RBC 5.28 (*)    All other components within normal limits  URINALYSIS, ROUTINE W REFLEX MICROSCOPIC - Abnormal; Notable for the following components:   Color, Urine STRAW (*)    Glucose, UA >=500 (*)    All other components within normal limits  URINALYSIS, MICROSCOPIC (REFLEX) - Abnormal; Notable for the following components:   Bacteria, UA RARE (*)    All other components within normal limits  CBG MONITORING, ED - Abnormal; Notable for the following components:   Glucose-Capillary >600 (*)    All other components within normal limits  CBG MONITORING, ED - Abnormal; Notable for the following components:   Glucose-Capillary 494 (*)    All other components within normal limits  CBG MONITORING, ED - Abnormal; Notable for the following components:   Glucose-Capillary 499 (*)    All other components within normal limits  BETA-HYDROXYBUTYRIC ACID    EKG None  Radiology No results  found.  Procedures Procedures (including critical care time)  Medications Ordered in ED Medications  sodium chloride 0.9 % bolus 1,000 mL ( Intravenous Stopped 03/22/20 1812)  insulin aspart (novoLOG) injection 10 Units (10 Units Intravenous Given 03/22/20 1848)    ED Course  I have reviewed the triage vital signs and the nursing notes.  Pertinent labs & imaging results that were available during my care of the patient were reviewed by me and considered in my medical decision making (see chart for details).    MDM Rules/Calculators/A&P                          History provided by patient and daughter with additional history obtained from chart review.    Patient seen and examined. Patient presents awake, alert, hemodynamically stable, afebrile, non toxic. She is very well appearing. Blood sugar checked during exam and meter reads >600.  Exam is overall unremarkable. No abdominal tenderness, no CVA tenderness. Labs show no leukocytosis, no anemia. CMP shows glucose of 662, bicarb is normal, BUN/creatinine slightly elevated at 29/1.32.  This is not far from patient's baseline.  Anion gap is 14.  There is no evidence of DKA. UA without infection, no ketones. Patient given liter of IV fluids.  Glucose rechecked and is trending down to 494.  10 units of IV NovoLog given.  The patient appears reasonably screened and/or stabilized for discharge and I doubt any other medical condition or other [facility] requiring further screening, evaluation, or treatment in the ED at this time prior to discharge. The patient is safe for discharge with strict return precautions discussed.  Patient  will call PCP tomorrow to schedule next available follow-up appointment. Patient and daughter are agreeable with plan of care. The patient was discussed with and seen by Dr. Maryan Rued who agrees with the treatment plan.   Portions of this note were generated with Lobby[employer]. Dictation errors may occur despite  best attempts at proofreading.   Final Clinical Impression(s) / ED Diagnoses Final diagnoses:  Hyperglycemia    Rx / DC Orders ED Discharge Orders    None       Flint Melter 03/22/20 1925    Blanchie Dessert, MD 03/23/20 2328

## 2020-03-22 NOTE — ED Triage Notes (Signed)
Pt reports blood sugar high since Thursday night.  Pt meter reads high.

## 2020-03-22 NOTE — ED Provider Notes (Signed)
Emergency Department Provider Note   I have reviewed the triage vital signs and the nursing notes.   HISTORY  Chief Complaint No chief complaint on file.   HPI [name] is a 85 y.o. female past history reviewed below presents to the emergency department with syncope today witnessed by daughter.  Patient was in the [facility] emergency department earlier today with hyperglycemia.  She had fairly minimal symptoms at that time and was ultimately discharged home.  She states that her daughter was taking her to [facility] and they were in the drive-through when she apparently passed out.  She states she does not recall exactly what happened.  She does not recall having heart palpitations, chest pain, shortness of breath.  No fevers. No med changes.  Patient states that she came to and felt like she needed to go sit in the bathroom.  She walked into [facility] when she walked out of the bathroom EMS was waiting for her.  They found her to be in A. fib with RVR with a rate in the 160s to 180s.  They started a peripheral IV and the patient spontaneously converted to normal sinus rhythm.  She did not have a large bowel movement or vomiting while in the bathroom.   Past Medical History:  Diagnosis Date  . Colon polyp   . Diabetes mellitus   . Hyperlipidemia   . Hypertension   . Poor appetite   . Thyroid disease     Patient Active Problem List   Diagnosis Date Noted  . Hyperglycemia 03/23/2020  . Uncontrolled type 2 diabetes mellitus with hyperglycemia ([place]) 10/26/2018  . Hypercholesterolemia 03/15/2018  . Atrial fibrillation with RVR ([place]) 08/18/2017  . Left hip pain 06/07/2017  . Hypothyroidism 02/03/2015  . Essential hypertension 07/10/2013  . Type 2 diabetes mellitus ([place]) 07/10/2013  . Upper respiratory infection 07/10/2013  . PAF (paroxysmal atrial fibrillation) ([place]) 12/26/2012  . Postop check 04/13/2011    Past Surgical History:  Procedure Laterality Date  . BREAST  EXCISIONAL BIOPSY Right   . CARDIOVASCULAR STRESS TEST  07/15/2006   Normal scan, no ECG changes  . TRANSTHORACIC ECHOCARDIOGRAM  01/08/2011   EF 60-65%, moderae mitral regurg, LA mild-moderately dilated,    Allergies Patient has no known allergies.  Family History  Problem Relation Age of Onset  . Diabetes Mother   . Hypertension Father   . Hyperlipidemia Father   . Diabetes Sister   . Hypertension Sister   . Hyperlipidemia Sister   . Hypertension Brother   . Stroke Sister   . Hypertension Sister   . Diabetes Sister   . Cancer Daughter   . Heart failure Child   . Breast cancer Neg Hx     Social History Social History   Tobacco Use  . Smoking status: Former Smoker    Types: Cigarettes    Quit date: 05/18/2003    Years since quitting: 16.8  . Smokeless tobacco: Never Used  Vaping Use  . Vaping Use: Never used  Substance Use Topics  . Alcohol use: No  . Drug use: No    Review of Systems  Constitutional: No fever/chills Eyes: No visual changes. ENT: No sore throat. Cardiovascular: Denies chest pain. Positive syncope.  Respiratory: Denies shortness of breath. Gastrointestinal: No abdominal pain.  No nausea, no vomiting.  No diarrhea.  No constipation. Genitourinary: Negative for dysuria. Musculoskeletal: Negative for back pain. Skin: Negative for rash. Neurological: Negative for headaches, focal weakness or numbness.  10-point ROS  otherwise negative.  ____________________________________________   PHYSICAL EXAM:  VITAL SIGNS: ED Triage Vitals  Enc Vitals Group     BP 03/22/20 2118 121/67     Pulse Rate 03/22/20 2118 74     Resp 03/22/20 2118 16     Temp 03/22/20 2118 (!) 97.5 F (36.4 C)     Temp Source 03/22/20 2118 Temporal     SpO2 03/22/20 2118 98 %     Weight 03/22/20 2117 129 lb 3 oz (58.6 kg)     Height 03/22/20 2117 5\' 2"  (1.575 m)   Constitutional: Alert and oriented. Well appearing and in no acute distress. Eyes: Conjunctivae are  normal. Head: Atraumatic. Nose: No congestion/rhinnorhea. Mouth/Throat: Mucous membranes are moist.   Neck: No stridor.   Cardiovascular: Normal rate, regular rhythm. Good peripheral circulation. Grossly normal heart sounds.   Respiratory: Normal respiratory effort.  No retractions. Lungs CTAB. Gastrointestinal: Soft and nontender. No distention.  Musculoskeletal: No lower extremity tenderness nor edema. No gross deformities of extremities. Neurologic:  Normal speech and language. No gross focal neurologic deficits are appreciated.  Skin:  Skin is warm, dry and intact. No rash noted.  ____________________________________________   LABS (all labs ordered are listed, but only abnormal results are displayed)  Labs Reviewed  COMPREHENSIVE METABOLIC PANEL - Abnormal; Notable for the following components:      Result Value   Sodium 131 (*)    Potassium 3.4 (*)    Chloride 94 (*)    Glucose, Bld 369 (*)    BUN 26 (*)    Creatinine, Ser 1.22 (*)    Total Protein 8.2 (*)    GFR calc non Af Amer 41 (*)    GFR calc Af Amer 48 (*)    All other components within normal limits  CBC WITH DIFFERENTIAL/PLATELET - Abnormal; Notable for the following components:   RBC 5.36 (*)    HCT 46.1 (*)    All other components within normal limits  BASIC METABOLIC PANEL - Abnormal; Notable for the following components:   Sodium 130 (*)    Chloride 93 (*)    Glucose, Bld 584 (*)    BUN 27 (*)    Creatinine, Ser 1.18 (*)    Calcium 8.8 (*)    GFR calc non Af Amer 43 (*)    GFR calc Af Amer 50 (*)    All other components within normal limits  HEMOGLOBIN A1C - Abnormal; Notable for the following components:   Hgb A1c MFr Bld 16.7 (*)    All other components within normal limits  CBG MONITORING, ED - Abnormal; Notable for the following components:   Glucose-Capillary 365 (*)    All other components within normal limits  CBG MONITORING, ED - Abnormal; Notable for the following components:    Glucose-Capillary 537 (*)    All other components within normal limits  CBG MONITORING, ED - Abnormal; Notable for the following components:   Glucose-Capillary 371 (*)    All other components within normal limits  CBG MONITORING, ED - Abnormal; Notable for the following components:   Glucose-Capillary 344 (*)    All other components within normal limits  CBG MONITORING, ED - Abnormal; Notable for the following components:   Glucose-Capillary 214 (*)    All other components within normal limits  TROPONIN I (HIGH SENSITIVITY) - Abnormal; Notable for the following components:   Troponin I (High Sensitivity) 23 (*)    All other components within normal limits  TROPONIN I (  HIGH SENSITIVITY) - Abnormal; Notable for the following components:   Troponin I (High Sensitivity) 35 (*)    All other components within normal limits  TROPONIN I (HIGH SENSITIVITY) - Abnormal; Notable for the following components:   Troponin I (High Sensitivity) 68 (*)    All other components within normal limits  SARS CORONAVIRUS 2 BY RT PCR [facility] ORDER, [place] LAB)  URINE CULTURE  TSH   ____________________________________________  EKG   EKG Interpretation  Date/Time:  Saturday March 22 2020 21:20:47 EDT Ventricular Rate:  74 PR Interval:    QRS Duration: 98 QT Interval:  431 QTC Calculation: 479 R Axis:   -25 Text Interpretation: Sinus rhythm ST depressions II-aVF and laterally No STEMI Confirmed by Nanda Quinton [phone]) on 03/22/2020 9:44:54 PM Also confirmed by Nanda Quinton [phone]), editor [place], [place] [phone])  on 03/23/2020 9:32:39 AM       ____________________________________________  RADIOLOGY  DG Chest Portable 1 View  Result Date: 03/22/2020 CLINICAL DATA:  Recent syncopal episode EXAM: PORTABLE CHEST 1 VIEW COMPARISON:  03/23/2011 FINDINGS: Cardiac shadow is stable. Lungs are well aerated bilaterally. No focal infiltrate is seen. No bony abnormality is noted.  IMPRESSION: No acute abnormality seen. Electronically Signed   By: Inez Catalina M.D.   On: 03/22/2020 22:08    ____________________________________________   PROCEDURES  Procedure(s) performed:   Procedures  None  ____________________________________________   INITIAL IMPRESSION / ASSESSMENT AND PLAN / ED COURSE  Pertinent labs & imaging results that were available during my care of the patient were reviewed by me and considered in my medical decision making (see chart for details).   Patient presents to the emergency department after syncope event this evening.  She was found to be in A. fib RVR.  Her rhythm is currently sinus with ST changes inferior and lateral which seem new in comparison to prior tracings.  She is not having active chest pain and ST changes do not meet STEMI criteria.  Blood pressure here is normal.  She is afebrile.  Plan for repeat blood work including troponin given EKG changes along with chest x-ray.  Given the patient's age, presentation, abnormal EKG, plan for admit.   Labs and imaging reviewed. No acute findings. Remains hyperglycemic without DKA. Plan for obs admit with syncope today.   Discussed patient's case with [facility] to request admission. Patient and family (if present) updated with plan. Care transferred to [facility] service.  I reviewed all nursing notes, vitals, pertinent old records, EKGs, labs, imaging (as available).  ____________________________________________  FINAL CLINICAL IMPRESSION(S) / ED DIAGNOSES  Final diagnoses:  Syncope, unspecified syncope type  Hyperglycemia     MEDICATIONS GIVEN DURING THIS VISIT:  Medications  metoprolol succinate (TOPROL-XL) 24 hr tablet 50 mg (has no administration in time range)  atorvastatin (LIPITOR) tablet 20 mg (20 mg Oral Given 03/23/20 1015)  diltiazem (CARDIZEM CD) 24 hr capsule 180 mg (has no administration in time range)  levothyroxine (SYNTHROID) tablet 100 mcg (100 mcg Oral Given 03/23/20 0750)   apixaban (ELIQUIS) tablet 5 mg (has no administration in time range)  insulin aspart (novoLOG) injection 0-5 Units (0 Units Subcutaneous Hold 03/23/20 0041)  lactated ringers infusion ( Intravenous Not Given 03/23/20 1235)  insulin glargine (LANTUS) injection 25 Units (has no administration in time range)  insulin aspart (novoLOG) injection 4 Units (4 Units Subcutaneous Given 03/23/20 1223)  insulin aspart (novoLOG) injection 0-15 Units (5 Units Subcutaneous Given 03/23/20 1225)  insulin glargine (LANTUS) injection  20 Units (20 Units Subcutaneous Given 03/23/20 0101)  lactated ringers bolus 1,000 mL (0 mLs Intravenous Stopped 03/23/20 0224)     Note:  This document was prepared using Dragon voice recognition software and may include unintentional dictation errors.  Nanda Quinton, MD, [facility] Emergency Medicine    Rayette Mogg, [facility], MD 03/23/20 [phone]

## 2020-03-22 NOTE — ED Notes (Signed)
Date and time results received: 03/22/20 1732   Test: glucose Critical Value: 662  Name of Provider Notified:[place] Orders Received? Or Actions Taken?: no orders given

## 2020-03-22 NOTE — Discharge Instructions (Signed)
Please call your doctor's office on Monday and schedule the next available hospital visit follow-up appointment.  Your blood sugar was elevated today.  It could be caused by the foods you are eating or you might need your insulin doses adjusted.  Return to the emergency department for any new or worsening symptoms.

## 2020-03-22 NOTE — ED Triage Notes (Signed)
Pt came in form [facility], pt was in the [employer] drive-thru w/ daughter where the daughter states pt was dizzy and not responding. Pt has a hx of Afib and HTN. When EMS arrived the pt was A/Ox4 and had a HR 160-180. Pt self converted out when EMS attempted an IV. Pt was seen at [facility] earlier today for Hyperglycemia. EMS report a 410CBG.

## 2020-03-23 DIAGNOSIS — E1165 Type 2 diabetes mellitus with hyperglycemia: Secondary | ICD-10-CM | POA: Diagnosis not present

## 2020-03-23 DIAGNOSIS — R739 Hyperglycemia, unspecified: Secondary | ICD-10-CM | POA: Insufficient documentation

## 2020-03-23 LAB — TROPONIN I (HIGH SENSITIVITY)
Troponin I (High Sensitivity): 35 ng/L — ABNORMAL HIGH (ref <18)
Troponin I (High Sensitivity): 68 ng/L — ABNORMAL HIGH (ref <18)

## 2020-03-23 LAB — BASIC METABOLIC PANEL
Anion gap: 15 (ref 5–15)
BUN: 27 mg/dL — ABNORMAL HIGH (ref 8–23)
CO2: 22 mmol/L (ref 22–32)
Calcium: 8.8 mg/dL — ABNORMAL LOW (ref 8.9–10.3)
Chloride: 93 mmol/L — ABNORMAL LOW (ref 98–111)
Creatinine, Ser: 1.18 mg/dL — ABNORMAL HIGH (ref 0.44–1.00)
GFR calc Af Amer: 50 mL/min — ABNORMAL LOW (ref >60)
GFR calc non Af Amer: 43 mL/min — ABNORMAL LOW (ref >60)
Glucose, Bld: 584 mg/dL (ref 70–99)
Potassium: 3.5 mmol/L (ref 3.5–5.1)
Sodium: 130 mmol/L — ABNORMAL LOW (ref 135–145)

## 2020-03-23 LAB — BETA-HYDROXYBUTYRIC ACID: Beta-Hydroxybutyric Acid: 0.69 mmol/L — ABNORMAL HIGH (ref 0.05–0.27)

## 2020-03-23 LAB — HEMOGLOBIN A1C
Hgb A1c MFr Bld: 16.7 % — ABNORMAL HIGH (ref 4.8–5.6)
Mean Plasma Glucose: 432.59 mg/dL

## 2020-03-23 LAB — TSH: TSH: 2.125 u[IU]/mL (ref 0.350–4.500)

## 2020-03-23 LAB — CBG MONITORING, ED
Glucose-Capillary: 214 mg/dL — ABNORMAL HIGH (ref 70–99)
Glucose-Capillary: 344 mg/dL — ABNORMAL HIGH (ref 70–99)
Glucose-Capillary: 371 mg/dL — ABNORMAL HIGH (ref 70–99)
Glucose-Capillary: 537 mg/dL (ref 70–99)

## 2020-03-23 MED ORDER — ATORVASTATIN CALCIUM 10 MG PO TABS
20.0000 mg | ORAL_TABLET | Freq: Every day | ORAL | Status: DC
  Administered 2020-03-23: 20 mg via ORAL
  Filled 2020-03-23: qty 2

## 2020-03-23 MED ORDER — TRESIBA FLEXTOUCH 100 UNIT/ML ~~LOC~~ SOPN: 14.0000 [IU] | PEN_INJECTOR | Freq: Every day | SUBCUTANEOUS | 3 refills | Status: DC

## 2020-03-23 MED ORDER — METOPROLOL SUCCINATE ER 50 MG PO TB24: 50.0000 mg | ORAL_TABLET | Freq: Two times a day (BID) | ORAL | Status: DC

## 2020-03-23 MED ORDER — APIXABAN 2.5 MG PO TABS: 2.5000 mg | ORAL_TABLET | Freq: Two times a day (BID) | ORAL | 3 refills | Status: DC

## 2020-03-23 MED ORDER — INSULIN GLARGINE 100 UNIT/ML ~~LOC~~ SOLN
25.0000 [IU] | Freq: Every day | SUBCUTANEOUS | Status: DC
  Filled 2020-03-23: qty 0.25

## 2020-03-23 MED ORDER — DILTIAZEM HCL ER COATED BEADS 180 MG PO CP24: 180.0000 mg | ORAL_CAPSULE | Freq: Every day | ORAL | Status: DC

## 2020-03-23 MED ORDER — INSULIN ASPART 100 UNIT/ML ~~LOC~~ SOLN
0.0000 [IU] | Freq: Three times a day (TID) | SUBCUTANEOUS | Status: DC
  Administered 2020-03-23: 11 [IU] via SUBCUTANEOUS
  Administered 2020-03-23: 5 [IU] via SUBCUTANEOUS

## 2020-03-23 MED ORDER — FIASP FLEXTOUCH 100 UNIT/ML ~~LOC~~ SOPN: 2.0000 [IU] | PEN_INJECTOR | Freq: Every day | SUBCUTANEOUS | 3 refills | Status: DC

## 2020-03-23 MED ORDER — INSULIN ASPART 100 UNIT/ML ~~LOC~~ SOLN: 0.0000 [IU] | Freq: Three times a day (TID) | SUBCUTANEOUS | Status: DC

## 2020-03-23 MED ORDER — LACTATED RINGERS IV BOLUS
1000.0000 mL | Freq: Once | INTRAVENOUS | Status: AC
  Administered 2020-03-23: 1000 mL via INTRAVENOUS

## 2020-03-23 MED ORDER — INSULIN ASPART 100 UNIT/ML ~~LOC~~ SOLN: 0.0000 [IU] | Freq: Every day | SUBCUTANEOUS | Status: DC

## 2020-03-23 MED ORDER — LACTATED RINGERS IV SOLN: INTRAVENOUS | Status: AC

## 2020-03-23 MED ORDER — INSULIN GLARGINE 100 UNIT/ML ~~LOC~~ SOLN
20.0000 [IU] | Freq: Once | SUBCUTANEOUS | Status: AC
  Administered 2020-03-23: 20 [IU] via SUBCUTANEOUS
  Filled 2020-03-23: qty 0.2

## 2020-03-23 MED ORDER — LEVOTHYROXINE SODIUM 100 MCG PO TABS
100.0000 ug | ORAL_TABLET | Freq: Every day | ORAL | Status: DC
  Administered 2020-03-23: 100 ug via ORAL
  Filled 2020-03-23: qty 1

## 2020-03-23 MED ORDER — APIXABAN 5 MG PO TABS: 5.0000 mg | ORAL_TABLET | Freq: Two times a day (BID) | ORAL | Status: DC

## 2020-03-23 MED ORDER — INSULIN ASPART 100 UNIT/ML ~~LOC~~ SOLN
4.0000 [IU] | Freq: Three times a day (TID) | SUBCUTANEOUS | Status: DC
  Administered 2020-03-23 (×2): 4 [IU] via SUBCUTANEOUS

## 2020-03-23 NOTE — ED Notes (Signed)
RN paged Floor Coverage Provider, [place], in regard to the [phone] troponin results.

## 2020-03-23 NOTE — ED Notes (Signed)
Pt IV Infiltrated. RN placed orders for IV Team.

## 2020-03-23 NOTE — ED Notes (Signed)
Ordered Breakfast--[name]  

## 2020-03-23 NOTE — Progress Notes (Signed)
Inpatient Diabetes Program Recommendations  AACE/ADA: New Consensus Statement on Inpatient Glycemic Control (2015)  Target Ranges:  Prepandial:   less than 140 mg/dL      Peak postprandial:   less than 180 mg/dL (1-2 hours)      Critically ill patients:  140 - 180 mg/dL   Lab Results  Component Value Date   GLUCAP 344 (H) 03/23/2020   HGBA1C 16.7 (H) 03/23/2020    Review of Glycemic Control Results for SHANISE, BALCH (MRN [id number]) as of 03/23/2020 13:07  Ref. Range 03/23/2020 00:31 03/23/2020 06:34 03/23/2020 07:42 03/23/2020 12:19  Glucose-Capillary Latest Ref Range: 70 - 99 mg/dL 537 (HH) 371 (H) 344 (H) 214 (H)   Diabetes history: DM 2 Outpatient Diabetes medications: Tresiba 14 units, Fiasp 2-4 units tid, CGM freestyle libre 14 day sensor  A1c 16.7%   Inpatient Diabetes Program Recommendations:   1214 pm: Called pt on the phone. Will call back. 1257 pm: Called pt's room, was connected, spoke with daughter who reported pt takes glucose readings by a continuous glucose monitor the freestyle libre 14 day sensor, daughter reported 3 years ago they were called by a nurse that told pt she was close to starting dialysis. Pt started eating right and controlling her glucose levels and did very well. Daughter reports she just saw her doctor last week. Daughter asked for me to call back in 15 minutes due to pt on a several way call with her brothers.  1:30 pm:  Called pt in her room in the ED regarding A1c and glucose control at home. Pt is followed by her PCP for DM management. Sees PCP every 4-6 weeks for follow up. Pt reports she checks her DM level and made adjustments. Pt also reports taking medications as prescribed. Pt uses her CGM to check glucose levels several times a day. Informed pt of current A1c of 16.7%. expressed concerned over elevated renal function and potential vascular damage. Discussed glucose and A1c goals. Pt reviewed her diet at home and she follows a DM diet per what she  reported to me. Discussed importance glucose control on her vascular system and to prevent further complications with kidney disease.  Pt to follow up with her PCP. May need higher doses of basal insulin at time of d/c. As per pt report glucose has ran consistently in the 300-400 range at home.  Thanks,  Tama Headings RN, MSN, BC-ADM Inpatient Diabetes Coordinator Team Pager [phone] (8a-5p)

## 2020-03-23 NOTE — Plan of Care (Signed)

## 2020-03-23 NOTE — Discharge Instructions (Signed)
Tama Headings Diabetes Coordinator  [phone] (office) M-F 8:30-4pm

## 2020-03-23 NOTE — Discharge Summary (Signed)
Physician Discharge Summary  [name] YKD:[id number] DOB: 11-Feb-1935 DOA: 03/22/2020  PCP: Deland Pretty, MD  Admit date: 03/22/2020 Discharge date: 03/23/2020  Admitted From: Home  Disposition:  Home   Recommendations for Outpatient Follow-up:  1. Follow up with PCP Dr. Shelia Media with blood glucose log in 1 week 2. Dr. Shelia Media: Please review glucose log and adjust insulins as needed given HgbA1c 16.7% 3. Patient referred to diabetes education as an outpatient      Home Health: None  Equipment/Devices: None  Discharge Condition: Good  CODE STATUS: FULL Diet recommendation: Diabetic  Brief/Interim Summary: [name] is a 85 y.o. F with Afib on Eliquis, HTN, hypothyroidism, and DM who presented with 2 days generalized weakness, and then pre-syncope.  Patient had been feeling weak and generally tired, malaise for 2 days.  Then on day of admission, was at [facility] with daughter, almost passed out.  EMS activated and found her in A. Fib with RVR and HRs 160s-180s.  In the ER, glucose 300s, HR converted spontaneously to sinus rhythm.  She was given 20 units Lantus and IV fluids and the hospitalist service were asked to observe overnight.           PRINCIPAL HOSPITAL DIAGNOSIS: Pre-syncope due to rapid Afib    Discharge Diagnoses:   Atrial fibrillation, paroxysmal, with RVR Patient was admitted and restarted on her home metoprolol and diltiazem.  Her heart rhythm spontaneously converted to sinus rhythm overnight and she was asymptomatic.  TSH normal.    NOTE: Given patient's weight, renal function, and age, we recommend Eliquis 2.5 mg twice daily dosing   Presyncope Patient had an elevated BUN to creatinine ratio, likely as a result of chronic hyperglycemia, polyuria, and her home diuretics.  She was given IV fluids overnight, and in the morning she felt normal at her baseline, completely asymptomatic.  Her presyncope was likely related to fast heart rate, in the  setting of hyperglycemia.  Hyperglycemia complicating type 2 diabetes Patient's A1c 16.7% indicating an average glucose of 433 mg/dl.  Overnight, with fluids, 20 units Lantus and a small subcutaneous correction insulin, her sugars corrected to the low 200s.  Extensive teaching given on diet, medication adherence.  Patient has a continuous glucose monitor which is an advantage for her.  After long discussion, it was still unclear to me whether her primary challenge is with dietary indiscretions or with forgetting doses of medicines (as she ages).  I recommended outpatietn diabetes teaching and strict adherence to carbohydrate-low diet.    I also recommend more involvement by her daughter in her care.  Lastly, I recommend close PCP follow up and careful recording of glucose logs so that her primary care can adjust her insulin carefully.     Elevated troponin ECG showed nonspecific changes.  The patient had no chest pain nor any exertional symptoms at all, now nor prior to admission.  Her elevated troponin is normal in setting of rapid afib.  No further ischemic work up needed.  Hypothyroidism TSH normal.   Continue levothyroxine  Hypertension Continue metoprolol, diltiazem, Lasix, HCTZ, triamterene  Pseudohyponatremia due to hyperglycemia  CKD IIIa Cr stable relative to baseline              Discharge Instructions  Discharge Instructions    Ambulatory referral to Nutrition and Diabetic Education   Complete by: As directed    Discharge instructions   Complete by: As directed    From Dr. Loleta Books: You had an episode at the restaurant  from fast heart rates, caused by too high blood sugars. Here in the hospital, your sugars came down and your heart rate went back to normal with fluids and insulin.   Call Dr. Pennie Banter office for an appointment this week Resume your normal insulin for now (Take long-acting Degludec insulin 14 units each night) (Take your short acting  aspart insulin 30 minutes before each meal, use your home sliding scale).  Write down a log of your blood sugars every day between now and when you see Dr. Shelia Media Check your sugar before breakfast, before lunch and then 2 hours after lunch and 2 hours after dinner  ALSO: write down how much insulin you take each day and bring that log to Dr. Shelia Media  Watch your diet.  If your sugars are over 300, call your primary care doctor  Call your primary care doctor for a follow up appointment in 1 week    NOTE: adjust your apixaban dose to 1/2 tablet and fill your new prescription You should be taking 2.5 mg apixaban twice daily   Increase activity slowly   Complete by: As directed      Allergies as of 03/23/2020   No Known Allergies     Medication List    TAKE these medications   apixaban 2.5 MG Tabs tablet Commonly known as: Eliquis Take 1 tablet (2.5 mg total) by mouth 2 (two) times daily. What changed:   medication strength  how much to take   atorvastatin 20 MG tablet Commonly known as: LIPITOR Take 20 mg by mouth daily.   Fiasp FlexTouch 100 UNIT/ML FlexTouch Pen Generic drug: insulin aspart Inject 2-6 Units into the skin daily. Sliding scale   furosemide 20 MG tablet Commonly known as: LASIX Take 1 tablet (20 mg total) by mouth as needed for edema. What changed:   how much to take  reasons to take this   levothyroxine 100 MCG tablet Commonly known as: SYNTHROID Take 100 mcg by mouth daily before breakfast.   metoprolol succinate 50 MG 24 hr tablet Commonly known as: TOPROL-XL TAKE 1 TABLET BY MOUTH TWICE A DAY What changed: when to take this   potassium chloride SA 20 MEQ tablet Commonly known as: Klor-Con M20 Take 1 tablet (20 mEq total) by mouth daily.   sitaGLIPtin 100 MG tablet Commonly known as: Januvia Take 1 tablet (100 mg total) by mouth daily.   Tiadylt ER 180 MG 24 hr capsule Generic drug: diltiazem Take 180 mg by mouth at bedtime.    Tyler Aas FlexTouch 100 UNIT/ML FlexTouch Pen Generic drug: insulin degludec Inject 0.14 mLs (14 Units total) into the skin at bedtime.   triamterene-hydrochlorothiazide 37.5-25 MG tablet Commonly known as: MAXZIDE-25 Take 1 tablet by mouth daily.       Follow-up Information    Deland Pretty, MD. Schedule an appointment as soon as possible for a visit in 1 week(s).   Specialty: Internal Medicine Contact information: [place] [address] [phone]              No Known Allergies  Consultations:     Procedures/Studies: DG Chest Portable 1 View  Result Date: 03/22/2020 CLINICAL DATA:  Recent syncopal episode EXAM: PORTABLE CHEST 1 VIEW COMPARISON:  03/23/2011 FINDINGS: Cardiac shadow is stable. Lungs are well aerated bilaterally. No focal infiltrate is seen. No bony abnormality is noted. IMPRESSION: No acute abnormality seen. Electronically Signed   By: Inez Catalina M.D.   On: 03/22/2020 22:08  Subjective: Patient feeling well.  No chest pain, dyspnea on exertion, exertional chest discomfort, palpitations, confusion.  Discharge Exam: Vitals:   03/23/20 1516 03/23/20 1547  BP:  124/75  Pulse:  78  Resp:  20  Temp: 98.9 F (37.2 C) 98.3 F (36.8 C)  SpO2:  100%   Vitals:   03/23/20 1509 03/23/20 1510 03/23/20 1516 03/23/20 1547  BP:    124/75  Pulse: 71 69  78  Resp: (!) 25 18  20   Temp:   98.9 F (37.2 C) 98.3 F (36.8 C)  TempSrc:   Oral Oral  SpO2: 96% 97%  100%  Weight:    60.6 kg  Height:    5\' 1"  (1.549 m)    General: Pt is alert, awake, not in acute distress Cardiovascular: RRR, nl S1-S2, no murmurs appreciated.   No LE edema.   Respiratory: Normal respiratory rate and rhythm.  CTAB without rales or wheezes. Abdominal: Abdomen soft and non-tender.  No distension or HSM.   Neuro/Psych: Strength symmetric in upper and lower extremities.  Judgment and insight appear normal.   The results of significant  diagnostics from this hospitalization (including imaging, microbiology, ancillary and laboratory) are listed below for reference.     Microbiology: Recent Results (from the past 240 hour(s))  SARS Coronavirus 2 by RT PCR (hospital order, performed in [facility] hospital lab) Nasopharyngeal Nasopharyngeal Swab     Status: None   Collection Time: 03/22/20  9:45 PM   Specimen: Nasopharyngeal Swab  Result Value Ref Range Status   SARS Coronavirus 2 NEGATIVE NEGATIVE Final    Comment: (NOTE) SARS-CoV-2 target nucleic acids are NOT DETECTED.  The SARS-CoV-2 RNA is generally detectable in upper and lower respiratory specimens during the acute phase of infection. The lowest concentration of SARS-CoV-2 viral copies this assay can detect is 250 copies / mL. A negative result does not preclude SARS-CoV-2 infection and should not be used as the sole basis for treatment or other patient management decisions.  A negative result may occur with improper specimen collection / handling, submission of specimen other than nasopharyngeal swab, presence of viral mutation(s) within the areas targeted by this assay, and inadequate number of viral copies (<250 copies / mL). A negative result must be combined with clinical observations, patient history, and epidemiological information.  Fact Sheet for Patients:   StrictlyIdeas.no  Fact Sheet for Healthcare Providers: BankingDealers.co.za  This test is not yet approved or  cleared by the [place] FDA and has been authorized for detection and/or diagnosis of SARS-CoV-2 by FDA under an Emergency Use Authorization (EUA).  This EUA will remain in effect (meaning this test can be used) for the duration of the COVID-19 declaration under Section 564(b)(1) of the Act, 21 U.S.C. section 360bbb-3(b)(1), unless the authorization is terminated or revoked sooner.  Performed at [facility], [place] [address] [place], [place] [address]      Labs: BNP (last 3 results) No results for input(s): BNP in the last 8760 hours. Basic Metabolic Panel: Recent Labs  Lab 03/22/20 1651 03/22/20 2142 03/23/20 0139  NA 131* 131* 130*  K 3.8 3.4* 3.5  CL 90* 94* 93*  CO2 27 23 22   GLUCOSE 662* 369* 584*  BUN 29* 26* 27*  CREATININE 1.32* 1.22* 1.18*  CALCIUM 9.7 9.7 8.8*   Liver Function Tests: Recent Labs  Lab 03/22/20 1651 03/22/20 2142  AST 28 31  ALT 30 30  ALKPHOS 82 83  BILITOT 1.0  0.8  PROT 7.9 8.2*  ALBUMIN 4.2 4.1   No results for input(s): LIPASE, AMYLASE in the last 168 hours. No results for input(s): AMMONIA in the last 168 hours. CBC: Recent Labs  Lab 03/22/20 1651 03/22/20 2142  WBC 5.3 6.1  NEUTROABS 3.1 3.6  HGB 14.7 15.0  HCT 44.8 46.1*  MCV 84.8 86.0  PLT 169 179   Cardiac Enzymes: No results for input(s): CKTOTAL, CKMB, CKMBINDEX, TROPONINI in the last 168 hours. BNP: Invalid input(s): POCBNP CBG: Recent Labs  Lab 03/22/20 2122 03/23/20 0031 03/23/20 0634 03/23/20 0742 03/23/20 1219  GLUCAP 365* 537* 371* 344* 214*   D-Dimer No results for input(s): DDIMER in the last 72 hours. Hgb A1c Recent Labs    03/23/20 0024  HGBA1C 16.7*   Lipid Profile No results for input(s): CHOL, HDL, LDLCALC, TRIG, CHOLHDL, LDLDIRECT in the last 72 hours. Thyroid function studies Recent Labs    03/23/20 0138  TSH 2.125   Anemia work up No results for input(s): VITAMINB12, FOLATE, FERRITIN, TIBC, IRON, RETICCTPCT in the last 72 hours. Urinalysis    Component Value Date/Time   COLORURINE STRAW (A) 03/22/2020 1651   APPEARANCEUR CLEAR 03/22/2020 1651   LABSPEC 1.010 03/22/2020 1651   PHURINE 6.5 03/22/2020 1651   GLUCOSEU >=500 (A) 03/22/2020 1651   HGBUR NEGATIVE 03/22/2020 1651   BILIRUBINUR NEGATIVE 03/22/2020 1651   KETONESUR NEGATIVE 03/22/2020 1651   PROTEINUR NEGATIVE 03/22/2020 1651   UROBILINOGEN 0.2 03/17/2011 0847   NITRITE NEGATIVE  03/22/2020 1651   LEUKOCYTESUR NEGATIVE 03/22/2020 1651   Sepsis Labs Invalid input(s): PROCALCITONIN,  WBC,  LACTICIDVEN Microbiology Recent Results (from the past 240 hour(s))  SARS Coronavirus 2 by RT PCR (hospital order, performed in [facility] hospital lab) Nasopharyngeal Nasopharyngeal Swab     Status: None   Collection Time: 03/22/20  9:45 PM   Specimen: Nasopharyngeal Swab  Result Value Ref Range Status   SARS Coronavirus 2 NEGATIVE NEGATIVE Final    Comment: (NOTE) SARS-CoV-2 target nucleic acids are NOT DETECTED.  The SARS-CoV-2 RNA is generally detectable in upper and lower respiratory specimens during the acute phase of infection. The lowest concentration of SARS-CoV-2 viral copies this assay can detect is 250 copies / mL. A negative result does not preclude SARS-CoV-2 infection and should not be used as the sole basis for treatment or other patient management decisions.  A negative result may occur with improper specimen collection / handling, submission of specimen other than nasopharyngeal swab, presence of viral mutation(s) within the areas targeted by this assay, and inadequate number of viral copies (<250 copies / mL). A negative result must be combined with clinical observations, patient history, and epidemiological information.  Fact Sheet for Patients:   StrictlyIdeas.no  Fact Sheet for Healthcare Providers: BankingDealers.co.za  This test is not yet approved or  cleared by the [place] FDA and has been authorized for detection and/or diagnosis of SARS-CoV-2 by FDA under an Emergency Use Authorization (EUA).  This EUA will remain in effect (meaning this test can be used) for the duration of the COVID-19 declaration under Section 564(b)(1) of the Act, 21 U.S.C. section 360bbb-3(b)(1), unless the authorization is terminated or revoked sooner.  Performed at [facility], [place] [address]  [place], [place] [address]      Time coordinating discharge: 45 minutes      SIGNED:   Edwin Dada, MD  [facility] 03/23/2020, 5:51 PM

## 2020-03-23 NOTE — Progress Notes (Signed)
Patient educated on d/c papers. Questions answered and IV removed. Taken by wheelchair to [employer] parking.

## 2020-03-23 NOTE — H&P (Addendum)
History and Physical    [name] XBL:[id number] DOB: [birth year] DOA: 03/22/2020  PCP: Deland Pretty, MD  Patient coming from: [facility] ED (lives at home)  I have personally briefly reviewed patient's old medical records in [place]  Chief Complaint: Weakness, near syncope  HPI: [name] is a 85 y.o. female with medical history significant of type 2 diabetes mellitus, hyperlipidemia, hypertension, hypothyroidism, paroxysmal atrial fibrillation on anticoagulation who was brought to the ED by EMS for near syncope and weakness.  History obtained from the patient.  Daughter at bedside.  She has been feeling weak for the last 2 to 3 days and sleeping more.  Today while at [facility] with her daughter, she almost passed out.  Daughter called the ambulance who found her in A. fib with RVR heart rate in 160s to 180s.  She spontaneously converted to normal sinus rhythm.   Earlier today, she visited another ED when her blood sugars were noted to be in the 600s.  She was given 1 L of IV fluids and 10 units of NovoLog with blood glucose improving to 494 following which she was discharged with PCP follow-up.  On further interview, patient recently started taking Metamucil.  Unclear if this is sugar-free.  Also endorses diet noncompliance.  Reports compliance with insulin.  Physically active at baseline.  ED Course: While in the ED, her blood sugars were noted to be in the 300s.  In sinus rhythm.  EKG showed T wave inversions from V3 to V6, lead III, aVF.  High-sensitivity troponin mildly elevated at 23.  She had no active chest pain or palpitations while in the ED.  Hospitalist called to admit for observation given EKG changes.  Review of Systems: As per HPI otherwise 10 point review of systems negative.   Past Medical History:  Diagnosis Date   Colon polyp    Diabetes mellitus    Hyperlipidemia    Hypertension    Poor appetite    Thyroid disease     Past Surgical History:  Procedure  Laterality Date   BREAST EXCISIONAL BIOPSY Right    CARDIOVASCULAR STRESS TEST  07/15/2006   Normal scan, no ECG changes   TRANSTHORACIC ECHOCARDIOGRAM  01/08/2011   EF 60-65%, moderae mitral regurg, LA mild-moderately dilated,     reports that she quit smoking about 16 years ago. Her smoking use included cigarettes. She has never used smokeless tobacco. She reports that she does not drink alcohol and does not use drugs.  No Known Allergies  Family History  Problem Relation Age of Onset   Diabetes Mother    Hypertension Father    Hyperlipidemia Father    Diabetes Sister    Hypertension Sister    Hyperlipidemia Sister    Hypertension Brother    Stroke Sister    Hypertension Sister    Diabetes Sister    Cancer Daughter    Heart failure Child    Breast cancer Neg Hx      Prior to Admission medications   Medication Sig Start Date End Date Taking? Authorizing Provider  apixaban (ELIQUIS) 5 MG TABS tablet Take 1 tablet (5 mg total) by mouth 2 (two) times daily. 02/08/19  Yes Hilty, Nadean Corwin, MD  atorvastatin (LIPITOR) 20 MG tablet Take 20 mg by mouth daily. 02/01/20  Yes [provider]  furosemide (LASIX) 20 MG tablet Take 1 tablet (20 mg total) by mouth as needed for edema. Patient taking differently: Take 10 mg by mouth as needed for  fluid or edema.  09/13/18  Yes Hilty, Nadean Corwin, MD  insulin aspart (FIASP FLEXTOUCH) 100 UNIT/ML FlexTouch Pen Inject 2-6 Units into the skin daily. Sliding scale   Yes [provider]  insulin degludec (TRESIBA FLEXTOUCH) 100 UNIT/ML FlexTouch Pen Inject 14 Units into the skin at bedtime.   Yes [provider]  levothyroxine (SYNTHROID, LEVOTHROID) 100 MCG tablet Take 100 mcg by mouth daily before breakfast.   Yes [provider]  metoprolol succinate (TOPROL-XL) 50 MG 24 hr tablet TAKE 1 TABLET BY MOUTH TWICE A DAY Patient taking differently: Take 50 mg by mouth in the morning and at bedtime.   12/05/19  Yes Hilty, Nadean Corwin, MD  TIADYLT ER 180 MG 24 hr capsule Take 180 mg by mouth at bedtime. 03/22/20  Yes [provider]  triamterene-hydrochlorothiazide (MAXZIDE-25) 37.5-25 MG tablet Take 1 tablet by mouth daily.  07/12/19  Yes [provider]  potassium chloride SA (KLOR-CON M20) 20 MEQ tablet Take 1 tablet (20 mEq total) by mouth daily. Patient not taking: Reported on 03/22/2020 11/08/18   Pixie Casino, MD  sitaGLIPtin (JANUVIA) 100 MG tablet Take 1 tablet (100 mg total) by mouth daily. Patient not taking: Reported on 03/22/2020 11/10/17   Lendon Colonel, NP    Physical Exam: Vitals:   03/22/20 2320 03/22/20 2345 03/23/20 0000 03/23/20 0007  BP:  108/77  (!) 109/55  Pulse:   74 78  Resp: (!) 8 19  16   Temp:      TempSrc:      SpO2:   98% 98%  Weight:      Height:        Constitutional: NAD, calm, comfortable Vitals:   03/22/20 2320 03/22/20 2345 03/23/20 0000 03/23/20 0007  BP:  108/77  (!) 109/55  Pulse:   74 78  Resp: (!) 8 19  16   Temp:      TempSrc:      SpO2:   98% 98%  Weight:      Height:       Eyes: PERRL, muddy sclera ENMT: Mucous membranes are dry.  Wearing dentures Neck: normal, supple Respiratory: clear to auscultation bilaterally, no wheezing, no crackles. Normal respiratory effort. No accessory muscle use.  Cardiovascular: Regular rate and rhythm, no murmurs. No extremity edema. 2+ pedal pulses.  Abdomen: no tenderness, no masses palpated. No hepatosplenomegaly. Bowel sounds positive.  Musculoskeletal: no clubbing / cyanosis. No joint deformity upper and lower extremities. Good ROM, no contractures. Normal muscle tone.  Skin: no rashes, lesions, ulcers. No induration Neurologic: CN 2-12 grossly intact. Sensation intact, DTR normal. Strength 5/5 in all 4.  Psychiatric: Normal judgment and insight. Alert and oriented x 3. Normal mood.   Labs on Admission: I have personally reviewed following labs and imaging  studies  CBC: Recent Labs  Lab 03/22/20 1651 03/22/20 2142  WBC 5.3 6.1  NEUTROABS 3.1 3.6  HGB 14.7 15.0  HCT 44.8 46.1*  MCV 84.8 86.0  PLT 169 175   Basic Metabolic Panel: Recent Labs  Lab 03/22/20 1651 03/22/20 2142  NA 131* 131*  K 3.8 3.4*  CL 90* 94*  CO2 27 23  GLUCOSE 662* 369*  BUN 29* 26*  CREATININE 1.32* 1.22*  CALCIUM 9.7 9.7   GFR: Estimated Creatinine Clearance: 28.1 mL/min (A) (by C-G formula based on SCr of 1.22 mg/dL (H)). Liver Function Tests: Recent Labs  Lab 03/22/20 1651 03/22/20 2142  AST 28 31  ALT 30 30  ALKPHOS  82 83  BILITOT 1.0 0.8  PROT 7.9 8.2*  ALBUMIN 4.2 4.1   No results for input(s): LIPASE, AMYLASE in the last 168 hours. No results for input(s): AMMONIA in the last 168 hours. Coagulation Profile: No results for input(s): INR, PROTIME in the last 168 hours. Cardiac Enzymes: No results for input(s): CKTOTAL, CKMB, CKMBINDEX, TROPONINI in the last 168 hours. BNP (last 3 results) No results for input(s): PROBNP in the last 8760 hours. HbA1C: No results for input(s): HGBA1C in the last 72 hours. CBG: Recent Labs  Lab 03/22/20 1621 03/22/20 1820 03/22/20 1919 03/22/20 2122 03/23/20 0031  GLUCAP >600* 494* 499* 365* 537*   Lipid Profile: No results for input(s): CHOL, HDL, LDLCALC, TRIG, CHOLHDL, LDLDIRECT in the last 72 hours. Thyroid Function Tests: No results for input(s): TSH, T4TOTAL, FREET4, T3FREE, THYROIDAB in the last 72 hours. Anemia Panel: No results for input(s): VITAMINB12, FOLATE, FERRITIN, TIBC, IRON, RETICCTPCT in the last 72 hours. Urine analysis:    Component Value Date/Time   COLORURINE STRAW (A) 03/22/2020 1651   APPEARANCEUR CLEAR 03/22/2020 1651   LABSPEC 1.010 03/22/2020 1651   PHURINE 6.5 03/22/2020 1651   GLUCOSEU >=500 (A) 03/22/2020 1651   HGBUR NEGATIVE 03/22/2020 1651   BILIRUBINUR NEGATIVE 03/22/2020 1651   KETONESUR NEGATIVE 03/22/2020 1651   PROTEINUR NEGATIVE 03/22/2020 1651    UROBILINOGEN 0.2 03/17/2011 0847   NITRITE NEGATIVE 03/22/2020 1651   LEUKOCYTESUR NEGATIVE 03/22/2020 1651    Radiological Exams on Admission: DG Chest Portable 1 View  Result Date: 03/22/2020 CLINICAL DATA:  Recent syncopal episode EXAM: PORTABLE CHEST 1 VIEW COMPARISON:  03/23/2011 FINDINGS: Cardiac shadow is stable. Lungs are well aerated bilaterally. No focal infiltrate is seen. No bony abnormality is noted. IMPRESSION: No acute abnormality seen. Electronically Signed   By: Inez Catalina M.D.   On: 03/22/2020 22:08    EKG: Independently reviewed.  EKG on 03/22/2020: Sinus rhythm, T wave inversions V3 to V6, lead III, aVF. Compared with EKG on 08/29/2019: Atrial fibrillation, with T wave inversions in above mentioned leads present.  Assessment/Plan Principal Problem:   Uncontrolled type 2 diabetes mellitus with hyperglycemia (HCC) Active Problems:   Hypothyroidism   Atrial fibrillation with RVR ([place])  Uncontrolled type 2 diabetes mellitus with hyperglycemia Suspect presentation in setting of poorly controlled blood sugars causing dehydration and subsequent paroxysmal atrial fibrillation Bicarbonate normal, no ketones in urine Hemoglobin A1c pending Suspect diet noncompliance.  Also, she unknowingly may be taking sugared Metamucil.  She will need extensive diabetes education and insulin dose adjustment at the time of discharge. Has a glucose sensor which has been reading high (therefore blood glucose more than 450) for the last 2 days -For now, 1 dose of insulin Lantus 20 units for blood sugar in the 500s.  Repeat blood sugar in 2 to 3 hours.  Pending on response, daily long-acting insulin needs to be ordered -Correctional insulin.  Pending need, she will likely need mealtime insulin added. -Check blood glucose 4 times a day and bedtime -1 L IV fluid bolus followed by LR 100 cc/h for maintenance fluids  Paroxysmal atrial fibrillation Rapid ventricular rate spontaneously converted to  sinus rhythm Suspect mild troponin leak in setting of paroxysmal episode of A. fib.  T wave inversions present on current EKG appear to be unchanged from previous EKG.  In the absence of active chest pain and current clinical scenario, this does not appear to be ACS.  Nevertheless, will repeat EKG in a.m. and trend troponin. Continue  current regimen including Eliquis  Hypothyroidism Continue levothyroxine We will add TSH  Hypertension Controlled Continue home medications  DVT prophylaxis: GE:ZMOQHUT Code Status: Full code  Family Communication: Discussed plan of care with daughter at bedside Disposition Plan: Discharge back home once blood sugars better controlled with close PCP follow-up Consults called: None Admission status: Observation   Lucky Cowboy MD [facility]  If 7PM-7AM, please contact night-coverage 03/23/2020, 12:55 AM

## 2020-03-24 DIAGNOSIS — N189 Chronic kidney disease, unspecified: Secondary | ICD-10-CM | POA: Diagnosis not present

## 2020-03-24 DIAGNOSIS — E039 Hypothyroidism, unspecified: Secondary | ICD-10-CM | POA: Diagnosis not present

## 2020-03-24 DIAGNOSIS — E139 Other specified diabetes mellitus without complications: Secondary | ICD-10-CM | POA: Diagnosis not present

## 2020-03-24 DIAGNOSIS — E785 Hyperlipidemia, unspecified: Secondary | ICD-10-CM | POA: Diagnosis not present

## 2020-03-25 ENCOUNTER — Encounter: Payer: Medicare PPO | Attending: Internal Medicine | Admitting: Nutrition

## 2020-03-25 ENCOUNTER — Other Ambulatory Visit: Payer: Self-pay

## 2020-03-25 DIAGNOSIS — Z713 Dietary counseling and surveillance: Secondary | ICD-10-CM | POA: Insufficient documentation

## 2020-03-25 DIAGNOSIS — R739 Hyperglycemia, unspecified: Secondary | ICD-10-CM

## 2020-03-25 DIAGNOSIS — E1165 Type 2 diabetes mellitus with hyperglycemia: Secondary | ICD-10-CM | POA: Insufficient documentation

## 2020-03-31 NOTE — Progress Notes (Signed)
Patient was identified by name and DOB.  She is here today because she says her blood sugars have been "very high" with no change in diet/medication. Current diabetes Medication:  Toujeo: 12u qQHS  Says rarely misses this medication. Fiasp: has "recently started this, and is taking"up to 10u ac meals, depending on what her blood sugar is doing before the meal and during the meal" FBS today is 201.  She took 4u, then 20 minutes later after eating oatmeal, it was 209, and she took 4 more units.  At 9AM it was 204-took 69more units.   AcL: 250-400  Took 5u,  If eating apple sauce for a mid morning snack, will take 2-3 more units SBGM:  Freestyle Libre.  Checks this several times/day.  Says readings have been 300-"Hi" in the last 2-3 weeks, but has started [place], and readings are coming down.   Typical day:   7:30-8AM awke.  Takes Thyroid medication with water.   9:30-10AM: Banana, toast with butter and 2 bollied eggs   11-12PM:  Few peanuts, or apple sauce, or banana if at work [place]).  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   1:30-4PM: (large meal of the day):  Grilled chicken-"2 small pieces with tossed salad, and potatoes.  Sweetened lemonade Or, fish house with baked salmon, small baked potato, butter, few hush puppies, un sweet tea. 7-8PM: chicken salad with few crackers or popcorn-1/2 bag water with crystal light flavored   9PM: Muffin  With water or Coke 0  Discussed the progression of type 2 diabetes, and the need for meal time insulin.  Discussed also the timing of the Fiap insulin and the fact that it will continue to lower blood sugar for 4-5 hours after injecting the dose.  Discussed the idea of balanced meals and the need for all 3 major food groups to help blood sugar control:  Carbs to give quick energy, protein to keep from getting hungry 2-4 hours after  eating, and preventing low blood sugars if taking Fiasp, and Fat to slow initial blood sugar rise down after eating the meal.  She reported good understanding of this.  Handouts given for all of the above.  Also was given handouts on what carbs are and the need to limit these between meals and she was given a handout on snacks containing less than 15 grams, to be eaten between meals.   Suggestions given: 1.  Stop all sweetened drinks. 2.  Stop Boost and Ensure  These are meal substitutes, not snacks.  If using this for a meal replacement.  Substitute Glucerna for Ensure 3. Wait  4 hours after taking Fiasp before taking more, unless consuming more carbohydrates.  4  Discussed symptoms and treatment of low blood sugars and she agreed to carry hard candy, or juice with her at all times. 5.  Limit eating to 3 meals and 2 snacks to prevent blood sugar rises and continuous injections of Fiasp.   Meal plan given for this. 6.  Stop muffin at HS.  Use other list of HS appropriate snacks 7.  Call if questions   .

## 2020-03-31 NOTE — Patient Instructions (Addendum)
1.  Stop all sweetened drinks. 2.  Stop Boost and Ensure  These are meal substitutes, not snacks.  If using this for a meal replacement.  Substiture Glucerna for Ensure  3. Wait  4 hours after taking Fiasp before taking more, unless consuming more carbohydrates.  4  Carry 2-3 pieces of hard candy, or 4 ounces of juice, small box of raisins with you at all times, in case symptoms of low blood sugar. 5.  Limit eating to 3 meals and 2 snacks to prevent blood sugar rises and continuous injections of Fiasp.   6.  Review Meal plan given for this. 7.  Stop muffin at HS.  Use other list of HS appropriate snacks wich are less than 150 calories 8.  Call if questions

## 2020-04-01 ENCOUNTER — Ambulatory Visit: Payer: Medicare PPO | Admitting: Physician Assistant

## 2020-04-02 DIAGNOSIS — E785 Hyperlipidemia, unspecified: Secondary | ICD-10-CM | POA: Diagnosis not present

## 2020-04-02 DIAGNOSIS — N189 Chronic kidney disease, unspecified: Secondary | ICD-10-CM | POA: Diagnosis not present

## 2020-04-02 DIAGNOSIS — E039 Hypothyroidism, unspecified: Secondary | ICD-10-CM | POA: Diagnosis not present

## 2020-04-02 DIAGNOSIS — E139 Other specified diabetes mellitus without complications: Secondary | ICD-10-CM | POA: Diagnosis not present

## 2020-04-15 ENCOUNTER — Other Ambulatory Visit: Payer: Self-pay | Admitting: Internal Medicine

## 2020-04-16 ENCOUNTER — Other Ambulatory Visit: Payer: Self-pay

## 2020-04-16 ENCOUNTER — Encounter: Payer: Self-pay | Admitting: Physician Assistant

## 2020-04-16 ENCOUNTER — Ambulatory Visit (INDEPENDENT_AMBULATORY_CARE_PROVIDER_SITE_OTHER): Payer: Medicare PPO | Admitting: Physician Assistant

## 2020-04-16 VITALS — BP 148/60 | HR 54 | Ht 61.5 in | Wt 135.2 lb

## 2020-04-16 DIAGNOSIS — I10 Essential (primary) hypertension: Secondary | ICD-10-CM | POA: Diagnosis not present

## 2020-04-16 DIAGNOSIS — I480 Paroxysmal atrial fibrillation: Secondary | ICD-10-CM

## 2020-04-16 DIAGNOSIS — E876 Hypokalemia: Secondary | ICD-10-CM | POA: Diagnosis not present

## 2020-04-16 NOTE — Progress Notes (Signed)
Cardiology Office Note   Date:  04/16/2020   ID:  Karen, Nichols Jun 14, 1935, MRN [id number]  PCP:  Deland Pretty, MD Cardiologist:  Pixie Casino, MD 03/15/2018 Electrphysiologist: None Rosaria Ferries, PA-C   No chief complaint on file.   History of Present Illness: [name] is a 85 y.o. female with a history of PAF on Coumadin, DM, HTN, HLD, hypothyroidism   Admitted 6/12-6/13/2021 for generalized weakness and presyncope.  She was in atrial fibrillation with heart rates in the 160s-180s.  Her glucose was in the 300s.  She converted spontaneously to sinus rhythm.  She was observed overnight and remained stable.  [name] presents for cardiology follow up.  On 06/12, her CBG had been over 300 for a couple of days. She was concerned and went to [facility] ER. There, her CBG was elevated, HR was 88, she was to f/u with her PCP.  She and her daughter went to [facility] for lunch and had presyncope. That is when her HR was >160. She felt better after returning to [place]. However, she was not aware she was out of rhythm except for the LOC changes.    She has a BP cuff, but has not recently checked her BP.  She was not aware that a BP cuff will give her a HR. She does not remember seeing an elevated HR on any previous BP checks.  She did not feel the atrial fib, her HR was normal when she went to [facility].   She has never had CP. She is very active, working and caring for her home. She mows and cleans, does not feel limited in any way by SOB. Never gets CP.   No LE edema, no orthopnea or PND.   No other episodes of presyncope or syncope.   When told her HR was in the low 50s, she reiterated that she is asymptomatic from this.  She reports that she gets leg cramps at times, when she gets them they are very painful.  She has been on K+ for a long time, is not on Mg.    Past Medical History:  Diagnosis Date  . Colon polyp   . Diabetes mellitus   . Hyperlipidemia   . Hypertension   .  Poor appetite   . Thyroid disease     Past Surgical History:  Procedure Laterality Date  . BREAST EXCISIONAL BIOPSY Right   . CARDIOVASCULAR STRESS TEST  07/15/2006   Normal scan, no ECG changes  . TRANSTHORACIC ECHOCARDIOGRAM  01/08/2011   EF 60-65%, moderae mitral regurg, LA mild-moderately dilated,    Current Outpatient Medications  Medication Sig Dispense Refill  . apixaban (ELIQUIS) 2.5 MG TABS tablet Take 1 tablet (2.5 mg total) by mouth 2 (two) times daily. 60 tablet 3  . atorvastatin (LIPITOR) 20 MG tablet Take 20 mg by mouth daily.    . furosemide (LASIX) 20 MG tablet Take 1 tablet (20 mg total) by mouth as needed for edema. (Patient taking differently: Take 10 mg by mouth as needed for fluid or edema. ) 30 tablet 0  . insulin aspart (FIASP FLEXTOUCH) 100 UNIT/ML FlexTouch Pen Inject 2-6 Units into the skin daily. Sliding scale 3 mL 3  . insulin degludec (TRESIBA FLEXTOUCH) 100 UNIT/ML FlexTouch Pen Inject 0.14 mLs (14 Units total) into the skin at bedtime. 3 mL 3  . levothyroxine (SYNTHROID, LEVOTHROID) 100 MCG tablet Take 100 mcg by mouth daily before breakfast.    . metoprolol succinate (TOPROL-XL)  50 MG 24 hr tablet TAKE 1 TABLET BY MOUTH TWICE A DAY (Patient taking differently: Take 50 mg by mouth in the morning and at bedtime. ) 180 tablet 2  . potassium chloride SA (KLOR-CON M20) 20 MEQ tablet Take 1 tablet (20 mEq total) by mouth daily. 90 tablet 1  . sitaGLIPtin (JANUVIA) 100 MG tablet Take 1 tablet (100 mg total) by mouth daily.    [name] ER 180 MG 24 hr capsule TAKE 1 CAPSULE BY MOUTH EVERY DAY 30 capsule 6  . triamterene-hydrochlorothiazide (MAXZIDE-25) 37.5-25 MG tablet Take 1 tablet by mouth daily.      No current facility-administered medications for this visit.    Allergies:   Patient has no known allergies.    Social History:  The patient  reports that she quit smoking about 16 years ago. Her smoking use included cigarettes. She has never used smokeless  tobacco. She reports that she does not drink alcohol and does not use drugs.   Family History:  The patient's family history includes Cancer in her daughter; Diabetes in her mother, sister, and sister; Heart failure in her child; Hyperlipidemia in her father and sister; Hypertension in her brother, father, sister, and sister; Stroke in her sister.  She indicated that her mother is deceased. She indicated that her father is deceased. She indicated that both of her sisters are alive. She indicated that both of her brothers are alive. She indicated that the status of her daughter is unknown. She indicated that the status of her child is unknown. She indicated that the status of her neg hx is unknown.   ROS:  Please see the history of present illness. All other systems are reviewed and negative.    PHYSICAL EXAM: VS:  BP (!) 148/60   Pulse (!) 54   Ht 5' 1.5" (1.562 m)   Wt 135 lb 3.2 oz (61.3 kg)   SpO2 97%   BMI 25.13 kg/m  , BMI Body mass index is 25.13 kg/m. GEN: Well nourished, well developed, female in no acute distress HEENT: normal for age  Neck: no JVD, ?L carotid bruit, no masses Cardiac: RRR; minimal murmur, no rubs, or gallops Respiratory:  clear to auscultation bilaterally, normal work of breathing GI: soft, nontender, nondistended, + BS MS: no deformity or atrophy; no edema; distal pulses are 2+ in all 4 extremities  Skin: warm and dry, no rash Neuro:  Strength and sensation are intact Psych: euthymic mood, full affect   EKG:  EKG is ordered today. The ekg ordered today demonstrates Sinus brady, HR 52, no acute ischemic changes  ECHO: 01/20/2018/ - Left ventricle: The cavity size was normal. Systolic function was  normal. The estimated ejection fraction was in the range of 50%  to 55%. Wall motion was normal; there were no regional wall  motion abnormalities. The study was not technically sufficient to  allow evaluation of LV diastolic dysfunction due to atrial   fibrillation.  - Mitral valve: There was mild regurgitation.  - Left atrium: The atrium was mildly dilated.  - Atrial septum: The septum bowed from left to right, consistent  with increased left atrial pressure.  - Pulmonary arteries: PA peak pressure: 35 mm Hg (S).    Recent Labs: 03/22/2020: ALT 30; Hemoglobin 15.0; Platelets 179 03/23/2020: BUN 27; Creatinine, Ser 1.18; Potassium 3.5; Sodium 130; TSH 2.125  CBC    Component Value Date/Time   WBC 6.1 03/22/2020 2142   RBC 5.36 (H) 03/22/2020 2142  HGB 15.0 03/22/2020 2142   HCT 46.1 (H) 03/22/2020 2142   PLT 179 03/22/2020 2142   MCV 86.0 03/22/2020 2142   MCH 28.0 03/22/2020 2142   MCHC 32.5 03/22/2020 2142   RDW 14.2 03/22/2020 2142   LYMPHSABS 1.7 03/22/2020 2142   MONOABS 0.6 03/22/2020 2142   EOSABS 0.1 03/22/2020 2142   BASOSABS 0.1 03/22/2020 2142   CMP Latest Ref Rng & Units 03/23/2020 03/22/2020 03/22/2020  Glucose 70 - 99 mg/dL 584(HH) 369(H) 662(HH)  BUN 8 - 23 mg/dL 27(H) 26(H) 29(H)  Creatinine 0.44 - 1.00 mg/dL 1.18(H) 1.22(H) 1.32(H)  Sodium 135 - 145 mmol/L 130(L) 131(L) 131(L)  Potassium 3.5 - 5.1 mmol/L 3.5 3.4(L) 3.8  Chloride 98 - 111 mmol/L 93(L) 94(L) 90(L)  CO2 22 - 32 mmol/L 22 23 27   Calcium 8.9 - 10.3 mg/dL 8.8(L) 9.7 9.7  Total Protein 6.5 - 8.1 g/dL - 8.2(H) 7.9  Total Bilirubin 0.3 - 1.2 mg/dL - 0.8 1.0  Alkaline Phos 38 - 126 U/L - 83 82  AST 15 - 41 U/L - 31 28  ALT 0 - 44 U/L - 30 30     Lipid Panel Lab Results  Component Value Date   CHOL  01/09/2011    88        ATP III CLASSIFICATION:  <200     mg/dL   Desirable  200-239  mg/dL   Borderline High  >=240    mg/dL   High          HDL 36 (L) 01/09/2011   LDLCALC  01/09/2011    32        Total Cholesterol/HDL:CHD Risk Coronary Heart Disease Risk Table                     Men   Women  1/2 Average Risk   3.4   3.3  Average Risk       5.0   4.4  2 X Average Risk   9.6   7.1  3 X Average Risk  23.4   11.0        Use the  calculated Patient Ratio above and the CHD Risk Table to determine the patient's CHD Risk.        ATP III CLASSIFICATION (LDL):  <100     mg/dL   Optimal  100-129  mg/dL   Near or Above                    Optimal  130-159  mg/dL   Borderline  160-189  mg/dL   High  >190     mg/dL   Very High   TRIG 101 01/09/2011   CHOLHDL 2.4 01/09/2011      Wt Readings from Last 3 Encounters:  04/16/20 135 lb 3.2 oz (61.3 kg)  03/31/20 131 lb 11.2 oz (59.7 kg)  03/23/20 133 lb 9.6 oz (60.6 kg)     Other studies Reviewed: Additional studies/ records that were reviewed today include: Office notes, hospital records and testing.  ASSESSMENT AND PLAN:  1.  PAF - maintaining SR - continue Dilt 180 mg qd and Toprol XL, she thinks she is only taking this once a day, she is to clarify this and let us know - HR low 50s but tolerated well, continue CCB and BB for now.  - CHA2DS2-VASc = 5 (age x 2, female, HTN, DM) - continue Eliquis 2.5 mg bid - no sig overload, so do  not feel a need to repeat her echo  2. HTN:  - BP is on the high side of normal, but cannot increase HR lowering Rx and do not wish to drop BP too low, in case she has more Afib  5. Leg cramps, hx hypokalemia - ck BMET & Mg, she may need more supplement    Current medicines are reviewed at length with the patient today.  The patient does not have concerns regarding medicines.  The following changes have been made:  no change  Labs/ tests ordered today include:  No orders of the defined types were placed in this encounter.   Disposition:   FU with Pixie Casino, MD  Signed, Rosaria Ferries, PA-C  04/16/2020 4:21 PM    [facility] Phone: [phone]; Fax: [phone]

## 2020-04-16 NOTE — Patient Instructions (Signed)
Medication Instructions:  No Changes *If you need a refill on your cardiac medications before your next appointment, please call your pharmacy*   Lab Work: Magnesium Level, BMP If you have labs (blood work) drawn today and your tests are completely normal, you will receive your results only by:  [place] (if you have MyChart) OR  A paper copy in the mail If you have any lab test that is abnormal or we need to change your treatment, we will call you to review the results.   Testing/Procedures: None   Follow-Up: At [facility], you and your health needs are our priority.  As part of our continuing mission to provide you with exceptional heart care, we have created designated Provider Care Teams.  These Care Teams include your primary Cardiologist (physician) and Advanced Practice Providers (APPs -  Physician Assistants and Nurse Practitioners) who all work together to provide you with the care you need, when you need it.  We recommend signing up for the patient portal called "MyChart".  Sign up information is provided on this After Visit Summary.  MyChart is used to connect with patients for Virtual Visits (Telemedicine).  Patients are able to view lab/test results, encounter notes, upcoming appointments, etc.  Non-urgent messages can be sent to your provider as well.   To learn more about what you can do with MyChart, go to [email or web address].    Your next appointment:   3 month(s)  The format for your next appointment:   In Person  Provider:   K. Mali Hilty, MD   Other Instructions Please Record Blood Pressure and Heart Rate Daily. High and Protein Diet Sheet.

## 2020-04-17 LAB — BASIC METABOLIC PANEL
BUN/Creatinine Ratio: 20 (ref 12–28)
BUN: 20 mg/dL (ref 8–27)
CO2: 25 mmol/L (ref 20–29)
Calcium: 9.6 mg/dL (ref 8.7–10.3)
Chloride: 98 mmol/L (ref 96–106)
Creatinine, Ser: 1 mg/dL (ref 0.57–1.00)
GFR calc Af Amer: 61 mL/min/{1.73_m2} (ref >59)
GFR calc non Af Amer: 53 mL/min/{1.73_m2} — ABNORMAL LOW (ref >59)
Glucose: 411 mg/dL — ABNORMAL HIGH (ref 65–99)
Potassium: 4.6 mmol/L (ref 3.5–5.2)
Sodium: 135 mmol/L (ref 134–144)

## 2020-04-17 LAB — MAGNESIUM: Magnesium: 2.2 mg/dL (ref 1.6–2.3)

## 2020-04-22 ENCOUNTER — Telehealth: Payer: Self-pay | Admitting: Physician Assistant

## 2020-04-22 NOTE — Telephone Encounter (Signed)
[name] is returning [name]'s call in regards to lab results. Please advise.

## 2020-04-22 NOTE — Addendum Note (Signed)
Addended by: Wonda Horner on: 04/22/2020 10:16 AM   Modules accepted: Orders

## 2020-04-22 NOTE — Telephone Encounter (Signed)
Called and spoke with pt, reviewed lab results and advised she follow up with her PCP. She reports she is on her way to get her blood sugar sensor and she is trying to get her sugars under control. She has been trying to eat better. She reports that "I am one of those people that can look at food and have high blood sugar" She reports that she "doesn't know which way to go." she states her sugar acts up with both her doctors.  Notified that per Rosaria Ferries PA she is to follow up with her PCP and that she has an upcoming appt for diabetes follow up on 04/30/20 at 11:45am. Pt states that she would be fine seeing them. Reports she would also like a follow up call from our office to remind her of her appt with Dr.Hilty. No other questions from pt at this time. Will send to Dr.Hilty's primary RN

## 2020-04-29 DIAGNOSIS — N189 Chronic kidney disease, unspecified: Secondary | ICD-10-CM | POA: Diagnosis not present

## 2020-04-29 DIAGNOSIS — E785 Hyperlipidemia, unspecified: Secondary | ICD-10-CM | POA: Diagnosis not present

## 2020-04-29 DIAGNOSIS — E139 Other specified diabetes mellitus without complications: Secondary | ICD-10-CM | POA: Diagnosis not present

## 2020-04-29 DIAGNOSIS — E118 Type 2 diabetes mellitus with unspecified complications: Secondary | ICD-10-CM | POA: Diagnosis not present

## 2020-04-29 DIAGNOSIS — E039 Hypothyroidism, unspecified: Secondary | ICD-10-CM | POA: Diagnosis not present

## 2020-04-30 ENCOUNTER — Encounter: Payer: Medicare PPO | Attending: Internal Medicine | Admitting: Nutrition

## 2020-04-30 ENCOUNTER — Other Ambulatory Visit: Payer: Self-pay

## 2020-04-30 DIAGNOSIS — E1165 Type 2 diabetes mellitus with hyperglycemia: Secondary | ICD-10-CM | POA: Diagnosis not present

## 2020-04-30 DIAGNOSIS — Z713 Dietary counseling and surveillance: Secondary | ICD-10-CM | POA: Diagnosis not present

## 2020-04-30 NOTE — Progress Notes (Signed)
Patient was indentified by name and DOB.  She is here for a review of her diet, insulin administration, and blood sugar readings.  She did not bring her libre reader, and memory of insulin doses is very vague.   Insulin dose:  Pt. Not certain of this but says she thinks it is 5u of fiasp before meals and 2 units before snacks after breakfast, lunch and supper, and Toujeo 18u at HS (11PM) SBGM: Freestyle Libre--did not bring meter.  Says FBS today was 130. Typical day: 7:30-8AM up.  Takes pill and does not eat or drink for 1 hour 10AM Bfast:  Cheerios with milk, or 1 egg with toast and butter, or yogurt , and coffee with small amount of milk.  26-94WN: nuts or sliced cucumbers, or pkg of Nabs 1PM: sandwich of [place], or ham with lettuce and tomato, small amount of mayo, and occasionally 3-4 chips.  Diet Coke to drink 3-4PM: small salad, or dry Cheerios, diet slushy 5-6PM: supper: baked chicken,or salmon, or pork- 3ounces, 2 nonstarchy veg., one starchy veg., or bread, diet coke to drink.  9PM: sugar free jello, or apple sauce, or yogurt, or toss salad, or 1/2 bag of popcorn 11PM: 18u orf Toujeo Pt. Very uncertain of insulin doses, but says she tries to take the [facility] before her meals and snacks, unless blood sugar is below 140.  She does not take her insulin. She says she is afraid that she will drop low if she takes her [employer] when her blood sugar is "low" She admits that blood sugars are going in the 300s after those meals. She also says she thinks she remembers to take her Toujeo every night.  Says can not remember if she misses a dose or not.   Reports not drinking sweet drinks any more, not using as much Glucerna--sometimes will drink this if she is going to work in the morning in place of bfast.  She does bolus 5u of Fiasp for this while at work, before drink this.   Discussion:   1.  Discussed that she needs to take the [facility] when blood sugars are normal before meals, to keep them from going  high after she eats.  "Fiasp insulin is not used to lower blood sugar, but to keep if from going high after a meal".  She agreed to take this when blood sugars are normal or less than 140.  She will take 4u if blood sugars are less than 80 before the meal.  Written instructions were given for this. 2. Praised her for stopping all sweet drinks and suggestions were given for other calorie free drinks.  3 Discussed the need to see her reader to determine if her blood sugars are coming down to target with her new insulin doses.  She agreed to return on Monday with reader for me to download and send to Dr. Garnet Koyanagi.  . Written Suggestions given: 1. Take Fiasp before all meals and snacks, even if blood sugars are normal.  Reduce dose by 1u if blood sugar is less than 80.   2.Return on Monday with reader to download.   3. Use stationary bike for 10 min. 2-3 times each day.

## 2020-05-01 NOTE — Patient Instructions (Signed)
1. Take Fiasp before all meals and snacks, even if blood sugars are normal.  Reduce dose by 1u if blood sugar is less than 80.   2.Return on Monday with reader to download.   3. Use stationary bike for 10 min. 2-3 times each day.

## 2020-05-06 ENCOUNTER — Telehealth: Payer: Self-pay | Admitting: Nutrition

## 2020-05-06 NOTE — Telephone Encounter (Signed)
Pt. Came in to have CGM downloaded and requested it be faxed to Dr. Festus Holts.   This was faxed to her office at [phone]

## 2020-05-22 DIAGNOSIS — E118 Type 2 diabetes mellitus with unspecified complications: Secondary | ICD-10-CM | POA: Diagnosis not present

## 2020-05-22 DIAGNOSIS — N189 Chronic kidney disease, unspecified: Secondary | ICD-10-CM | POA: Diagnosis not present

## 2020-05-22 DIAGNOSIS — E785 Hyperlipidemia, unspecified: Secondary | ICD-10-CM | POA: Diagnosis not present

## 2020-05-22 DIAGNOSIS — E139 Other specified diabetes mellitus without complications: Secondary | ICD-10-CM | POA: Diagnosis not present

## 2020-06-10 ENCOUNTER — Encounter: Payer: Medicare PPO | Attending: Internal Medicine | Admitting: Nutrition

## 2020-06-10 ENCOUNTER — Other Ambulatory Visit: Payer: Self-pay

## 2020-06-10 DIAGNOSIS — Z713 Dietary counseling and surveillance: Secondary | ICD-10-CM | POA: Insufficient documentation

## 2020-06-10 DIAGNOSIS — E1165 Type 2 diabetes mellitus with hyperglycemia: Secondary | ICD-10-CM | POA: Diagnosis not present

## 2020-06-11 NOTE — Patient Instructions (Signed)
If drinking Boost in the AM, do not eat breakfast. Increase Novolog dose:5u before breakfast, 8u before lunch, and 9u before supper

## 2020-06-11 NOTE — Progress Notes (Signed)
Patient reports not drinking any sweet drinks, and "very little sweets" Is very concerned that blood sugars "are so high in the afternoon and evening-usually in the 300s", desspite eating very little. Insulin dose: Novolog: 4/6/8, Tresiba:15uHS  Activity:  Works 3 days/wk, and is active during these times.  Does yard work in early morning on days off Diet: 8:30 up.  Drinks a Boost drink, and will eat bfast at 9:30 of eggs and 1 piece of toast..  Stressed again, that boost is uses as a replacement for breakfast 12:30 bbq, slaw, 3 hush puppies, with diet coke 4PM: 4 Peanut butter crackers, or apple sauce, or jello-sometimes diet jello, sometimes regular jello 6PM: meat (chicken usually-4 ounces, or beans, corn, okra, and tomatoes.  No bread 9PM;snack as above at 4PM:. [employer] for last 14 days shows average FBS: 230, acL: 250s, acS: 362, HS: 399.(Pt. Says she always remembers Novolog dose acL and acS.) Plan: Increase Novolog dose by 1u acB to 5u, acL to 8u and acS to 9u Written instructions given for this.  Will call her on Friday morning

## 2020-06-17 ENCOUNTER — Ambulatory Visit (INDEPENDENT_AMBULATORY_CARE_PROVIDER_SITE_OTHER): Payer: Medicare PPO | Admitting: Internal Medicine

## 2020-06-17 ENCOUNTER — Encounter: Payer: Self-pay | Admitting: Internal Medicine

## 2020-06-17 ENCOUNTER — Other Ambulatory Visit: Payer: Self-pay

## 2020-06-17 VITALS — BP 128/56 | HR 51 | Ht 61.0 in | Wt 135.2 lb

## 2020-06-17 DIAGNOSIS — Z7901 Long term (current) use of anticoagulants: Secondary | ICD-10-CM | POA: Diagnosis not present

## 2020-06-17 DIAGNOSIS — Z794 Long term (current) use of insulin: Secondary | ICD-10-CM | POA: Diagnosis not present

## 2020-06-17 DIAGNOSIS — E118 Type 2 diabetes mellitus with unspecified complications: Secondary | ICD-10-CM

## 2020-06-17 DIAGNOSIS — I10 Essential (primary) hypertension: Secondary | ICD-10-CM | POA: Diagnosis not present

## 2020-06-17 DIAGNOSIS — I480 Paroxysmal atrial fibrillation: Secondary | ICD-10-CM | POA: Diagnosis not present

## 2020-06-17 NOTE — Patient Instructions (Signed)
Medication Instructions:  Your Physician recommend you continue on your current medication as directed.    *If you need a refill on your cardiac medications before your next appointment, please call your pharmacy*   Lab Work: None ordered    Testing/Procedures: None ordered    Follow-Up: At [facility], you and your health needs are our priority.  As part of our continuing mission to provide you with exceptional heart care, we have created designated Provider Care Teams.  These Care Teams include your primary Cardiologist (physician) and Advanced Practice Providers (APPs -  Physician Assistants and Nurse Practitioners) who all work together to provide you with the care you need, when you need it.  We recommend signing up for the patient portal called "MyChart".  Sign up information is provided on this After Visit Summary.  MyChart is used to connect with patients for Virtual Visits (Telemedicine).  Patients are able to view lab/test results, encounter notes, upcoming appointments, etc.  Non-urgent messages can be sent to your provider as well.   To learn more about what you can do with MyChart, go to [email or web address].    Your next appointment:   1 year(s)  The format for your next appointment:   In Person  Provider:   K. Mali Hilty, MD

## 2020-06-17 NOTE — Progress Notes (Signed)
OFFICE NOTE  Chief Complaint:  Routine follow-up  Primary Care Physician: Deland Pretty, MD  HPI:  [name] is a 85 year old overweight African American female with a history of paroxysmal atrial fibrillation on Coumadin, hypothyroidism, dyslipidemia, diabetes mellitus type 2, hypertension.  She had an episode of PAF in 2014, but this went away fairly quickly and she is not been symptomatic since then. This may be related to her not taking her medications as scheduled. Since then she's been taking her metoprolol twice daily and has had no real problems. Her warfarin level remains therapeutic and well controlled. Over the past several months she's had a problem with breakthrough paroxysmal atrial fibrillation.  In fact at this point she seems to be persistent.  She saw 2 other physician assistants in our office to were able to get her rate controlled. Once she was at a normal ventricular rate, she felt fairly good. In fact she cannot really tell difference when she is in A. fib versus when she is in sinus rhythm.  Previously her episode was possibly related to thyroid and balance, however her thyroid levels have been well controlled as has her diabetes.   [name] returns today for follow-up. Her EKG shows sinus rhythm. It is notable that she is bradycardic today with heart rate in the upper 40s. INR was checked in the office today and is low at 1.7. This will be adjusted by Erasmo Downer, our anticoagulation pharmacist. She denies any complaints such as shortness of breath, increasing fatigue, presyncope or syncopal symptoms. She also denies any chest pain or palpitations and is unaware of any recurrent atrial fibrillation. She's recently been started on an injectable insulin in addition to her oral Glucophage for diabetes. Her cholesterol is also managed by her primary care provider.  07/15/2016  [name] returns today for follow-up. She's been followed monthly for her INRs which been  therapeutic. She reports feeling well denies any chest pain or recurrent atrial fibrillation. EKG shows sinus rhythm. Overall she feels that she is doing well. Blood prssure was mildly elevated today recently was 122/60 in the office.  08/18/2017  [name] returns today for follow-up.  She was last seen about a year ago.  Overall she seems to be doing okay but she is slowing down.  She says she cannot work quite as much as she could previously.  Although she has a history of atrial fibrillation, recently she has had no recurrence.  Today however she is noted to be in A. fib with RVR and heart rate of 123.  He denies any chest pain or worsening shortness of breath with this.  03/15/2018  [name] was seen today in follow-up.  Overall she is feeling well.  Her INR was therapeutic today.  Blood pressure is well controlled.  A. fib rate is slightly fast at 101.  She is asymptomatic with this.  She reports good energy level except in the mornings where she is somewhat fatigued.  Hemoglobin A1c recently in December was 9.9 however she has had medication adjustment and lost about 10 pounds since then.  The dietary changes should have helped her.  In December her cholesterol was very elevated as well with total cholesterol 261, HDL 57, LDL 173 and triglycerides 153.  We discussed her diet today and she reports she still eats out almost every day.  She has been going to a fish restaurant and eating fried fish several times a week which likely is raising her saturated fats  intake.  I we discussed healthier options she can choose however she is unlikely to meet her LDL goal less than 70.  08/29/2019  [name] returns today for follow-up.  Overall she is doing well.  Recent labs showed improvement in her dyslipidemia.  Her total cholesterol is now 90, HDL 42, LDL 35 and triglycerides 65 on 40 mg atorvastatin.  She does report some improvement in her hemoglobin A1c.  She remains in stable rate controlled A.  fib.  She has no bleeding problems on Eliquis.  She denies any shortness of breath or chest pain.  She is active.  06/18/2020  [name] is seen today in follow-up.  She continues to do well.  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  Blood pressure is excellent today 128/56.  She denies any recurrent A. fib and her EKG shows sinus bradycardia at 51 today.  She denies any bleeding issues on low-dose apixaban 2.5 mg twice daily.  Lab work more recently showed excellent cholesterol control with LDL in the 50s and total cholesterol under 100.  Unfortunately in June she was admitted for issues with severe hyperglycemia and A1c was 16.7.  Since then she has had some improvements in her blood sugars but is looking to transition to a different endocrinologist as she has struggled with some hyperglycemia in the evenings and some occasional morning hypoglycemia.[name]  PMHx:  Past Medical History:  Diagnosis Date   Colon polyp    Diabetes mellitus    Hyperlipidemia    Hypertension    Poor appetite    Thyroid disease     Past Surgical History:  Procedure Laterality Date   BREAST EXCISIONAL BIOPSY Right    CARDIOVASCULAR STRESS TEST  07/15/2006   Normal scan, no ECG changes   TRANSTHORACIC ECHOCARDIOGRAM  01/08/2011   EF 60-65%, moderae mitral regurg, LA mild-moderately dilated,    FAMHx:  Family History  Problem Relation Age of Onset   Diabetes Mother    Hypertension Father    Hyperlipidemia Father    Diabetes Sister    Hypertension Sister    Hyperlipidemia Sister    Hypertension Brother    Stroke Sister    Hypertension Sister    Diabetes Sister    Cancer Daughter    Heart failure Child    Breast cancer Neg Hx     SOCHx:   reports that she quit smoking about 17 years ago. Her smoking use included cigarettes. She has never used smokeless tobacco. She reports that she does not drink alcohol and does not use  drugs.  ALLERGIES:  No Known Allergies  ROS: Pertinent items noted in HPI and remainder of comprehensive ROS otherwise negative.  HOME MEDS: Current Outpatient Medications  Medication Sig Dispense Refill   apixaban (ELIQUIS) 2.5 MG TABS tablet Take 1 tablet (2.5 mg total) by mouth 2 (two) times daily. 60 tablet 3   atorvastatin (LIPITOR) 20 MG tablet Take 20 mg by mouth daily.     furosemide (LASIX) 20 MG tablet Take 1 tablet (20 mg total) by mouth as needed for edema. (Patient taking differently: Take 10 mg by mouth as needed for fluid or edema. ) 30 tablet 0   insulin aspart (FIASP FLEXTOUCH) 100 UNIT/ML FlexTouch Pen Inject 2-6 Units into the skin daily. Sliding scale 3 mL 3   insulin degludec (TRESIBA FLEXTOUCH) 100 UNIT/ML FlexTouch Pen Inject 0.14 mLs (14 Units total) into the skin at bedtime. 3  mL 3   levothyroxine (SYNTHROID, LEVOTHROID) 100 MCG tablet Take 100 mcg by mouth daily before breakfast.     metoprolol succinate (TOPROL-XL) 50 MG 24 hr tablet TAKE 1 TABLET BY MOUTH TWICE A DAY (Patient taking differently: Take 50 mg by mouth in the morning and at bedtime. ) 180 tablet 2   potassium chloride SA (KLOR-CON M20) 20 MEQ tablet Take 1 tablet (20 mEq total) by mouth daily. 90 tablet 1   sitaGLIPtin (JANUVIA) 100 MG tablet Take 1 tablet (100 mg total) by mouth daily.     TIADYLT ER 180 MG 24 hr capsule TAKE 1 CAPSULE BY MOUTH EVERY DAY 30 capsule 6   triamterene-hydrochlorothiazide (MAXZIDE-25) 37.5-25 MG tablet Take 1 tablet by mouth daily.      No current facility-administered medications for this visit.    LABS/IMAGING: No results found for this or any previous visit (from the past 48 hour(s)). No results found.  VITALS: BP (!) 128/56    Pulse (!) 51    Ht 5\' 1"  (1.549 m)    Wt 135 lb 3.2 oz (61.3 kg)    BMI 25.55 kg/m   EXAM: General appearance: alert and no distress Neck: no carotid bruit, no JVD and thyroid not enlarged, symmetric, no  tenderness/mass/nodules Lungs: clear to auscultation bilaterally Heart: irregularly irregular rhythm and Tachycardic Abdomen: soft, non-tender; bowel sounds normal; no masses,  no organomegaly Extremities: extremities normal, atraumatic, no cyanosis or edema Pulses: 2+ and symmetric Skin: Skin color, texture, turgor normal. No rashes or lesions Neurologic: Grossly normal Psych: Pleasant  EKG: Sinus bradycardia 51, moderate voltage criteria for LVH-personally reviewed  ASSESSMENT: 1. Paroxysmal A. Fib - CHADSVASC score of 4 on warfarin 2. Hypertension 3. Diabetes type 2  4. Hypothyroidism 5. Dyslipidemia  PLAN: 1.   [name] is doing well from a cardiac standpoint.  She is unaware of her A. fib which actually is not present today therefore has some paroxysmal element.  Unfortunately she recently had severe hyperglycemia with an A1c over 16 and was hospitalized.  Her blood sugars have come down and are improving.  Cholesterol has been excellent and blood pressures well controlled.  No changes to her medicines today.  Follow-up with me annually or sooner as necessary.  Pixie Casino, MD, [facility], [place] Director of the [facility]  [facility] Diplomate of the American Board of Clinical Lipidology Attending Cardiologist  Direct Dial: [phone]   Fax: [phone]  Website:  [email or web address] Ziya Coonrod 06/17/2020, 2:02 PM

## 2020-06-18 ENCOUNTER — Encounter: Payer: Self-pay | Admitting: Internal Medicine

## 2020-06-25 ENCOUNTER — Telehealth: Payer: Self-pay | Admitting: Nutrition

## 2020-07-01 ENCOUNTER — Other Ambulatory Visit: Payer: Self-pay

## 2020-07-01 ENCOUNTER — Encounter: Payer: Medicare PPO | Attending: Internal Medicine | Admitting: Nutrition

## 2020-07-01 DIAGNOSIS — Z713 Dietary counseling and surveillance: Secondary | ICD-10-CM | POA: Insufficient documentation

## 2020-07-01 DIAGNOSIS — E1165 Type 2 diabetes mellitus with hyperglycemia: Secondary | ICD-10-CM | POA: Insufficient documentation

## 2020-07-02 DIAGNOSIS — Z23 Encounter for immunization: Secondary | ICD-10-CM | POA: Diagnosis not present

## 2020-07-02 DIAGNOSIS — E785 Hyperlipidemia, unspecified: Secondary | ICD-10-CM | POA: Diagnosis not present

## 2020-07-02 DIAGNOSIS — E139 Other specified diabetes mellitus without complications: Secondary | ICD-10-CM | POA: Diagnosis not present

## 2020-07-02 DIAGNOSIS — N189 Chronic kidney disease, unspecified: Secondary | ICD-10-CM | POA: Diagnosis not present

## 2020-07-02 DIAGNOSIS — E118 Type 2 diabetes mellitus with unspecified complications: Secondary | ICD-10-CM | POA: Diagnosis not present

## 2020-07-02 NOTE — Telephone Encounter (Signed)
Opened in error

## 2020-07-02 NOTE — Progress Notes (Signed)
Current insulin dose: Tyler Aas: 15u 7PM: Fiasp: 8/5/8 Blood sugar readinga:Libre sensor readings average for last month: 12AM-6AM: 173 6AM-12PM: 213 12PM-6PM: 276 6PM-MN-284 Diet appears unchanged from previous month--Patient Not drinking any sweet drinks, and breakfasts now including protein.  Still drinking glucerna when she goes to work 3 days/wk, before taking her insulin.  Discussed the importance of the need for insulin before drinking this.   Reports that she forgets to take her fiasp "occasionaly" and her Tesiba-"maybe 1-2 times/wk.  She ate pizza today 30 min. Ago for lunch,  and did not take her Fiasp insulin.  Her blood 246.  Discussed the importance of taking both of theres medications.  Explained that if she knows she forgot to take the [place], but remembered this the next morning, that she could take this the next morning, and then take the next dose that night.  That this medication will not overlap and cause low blood sugaus.  But feel that maybe she does not comprehend this. She found a brochure on [employer] and I believe that this might be a better option for her to use daily, so that she will not have to remember to take the [place] and will possible be able to press buttons to deliver her meal time insulin. She will discuss this option with the doctor she is seeing tomorrow.  She was told that I have a 6 day free sample for her try if her doctor writes the order for this.

## 2020-07-02 NOTE — Patient Instructions (Addendum)
Discuss V-Go with MD tomorrow Take Tyler Aas every night Take Fiasp before all meals.

## 2020-08-14 DIAGNOSIS — H6121 Impacted cerumen, right ear: Secondary | ICD-10-CM | POA: Diagnosis not present

## 2020-08-21 DIAGNOSIS — H52203 Unspecified astigmatism, bilateral: Secondary | ICD-10-CM | POA: Diagnosis not present

## 2020-08-21 DIAGNOSIS — H40013 Open angle with borderline findings, low risk, bilateral: Secondary | ICD-10-CM | POA: Diagnosis not present

## 2020-08-21 DIAGNOSIS — E119 Type 2 diabetes mellitus without complications: Secondary | ICD-10-CM | POA: Diagnosis not present

## 2020-08-21 DIAGNOSIS — Z961 Presence of intraocular lens: Secondary | ICD-10-CM | POA: Diagnosis not present

## 2020-08-28 DIAGNOSIS — E118 Type 2 diabetes mellitus with unspecified complications: Secondary | ICD-10-CM | POA: Diagnosis not present

## 2020-08-28 DIAGNOSIS — E039 Hypothyroidism, unspecified: Secondary | ICD-10-CM | POA: Diagnosis not present

## 2020-09-17 ENCOUNTER — Ambulatory Visit: Payer: Medicare PPO

## 2020-09-27 ENCOUNTER — Encounter (HOSPITAL_BASED_OUTPATIENT_CLINIC_OR_DEPARTMENT_OTHER): Payer: Self-pay | Admitting: Emergency Medicine

## 2020-09-27 ENCOUNTER — Other Ambulatory Visit: Payer: Self-pay

## 2020-09-27 ENCOUNTER — Emergency Department (HOSPITAL_BASED_OUTPATIENT_CLINIC_OR_DEPARTMENT_OTHER)
Admission: EM | Admit: 2020-09-27 | Discharge: 2020-09-27 | Disposition: A | Discharge status: Home / Self Care | Payer: Medicare PPO | Attending: Emergency Medicine | Admitting: Emergency Medicine

## 2020-09-27 DIAGNOSIS — I10 Essential (primary) hypertension: Secondary | ICD-10-CM | POA: Diagnosis not present

## 2020-09-27 DIAGNOSIS — Z7984 Long term (current) use of oral hypoglycemic drugs: Secondary | ICD-10-CM | POA: Insufficient documentation

## 2020-09-27 DIAGNOSIS — Z87891 Personal history of nicotine dependence: Secondary | ICD-10-CM | POA: Diagnosis not present

## 2020-09-27 DIAGNOSIS — E039 Hypothyroidism, unspecified: Secondary | ICD-10-CM | POA: Insufficient documentation

## 2020-09-27 DIAGNOSIS — Z794 Long term (current) use of insulin: Secondary | ICD-10-CM | POA: Diagnosis not present

## 2020-09-27 DIAGNOSIS — Z79899 Other long term (current) drug therapy: Secondary | ICD-10-CM | POA: Diagnosis not present

## 2020-09-27 DIAGNOSIS — E1165 Type 2 diabetes mellitus with hyperglycemia: Secondary | ICD-10-CM | POA: Insufficient documentation

## 2020-09-27 DIAGNOSIS — T59811A Toxic effect of smoke, accidental (unintentional), initial encounter: Secondary | ICD-10-CM | POA: Diagnosis not present

## 2020-09-27 DIAGNOSIS — R42 Dizziness and giddiness: Secondary | ICD-10-CM | POA: Diagnosis present

## 2020-09-27 NOTE — ED Notes (Signed)
SpCO 0% with Masimo Rad57
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
## 2020-09-27 NOTE — ED Notes (Signed)
SpCO= 1%

## 2020-11-07 DIAGNOSIS — E118 Type 2 diabetes mellitus with unspecified complications: Secondary | ICD-10-CM | POA: Diagnosis not present

## 2020-11-07 DIAGNOSIS — N390 Urinary tract infection, site not specified: Secondary | ICD-10-CM | POA: Diagnosis not present

## 2020-11-07 DIAGNOSIS — I10 Essential (primary) hypertension: Secondary | ICD-10-CM | POA: Diagnosis not present

## 2020-11-07 DIAGNOSIS — E785 Hyperlipidemia, unspecified: Secondary | ICD-10-CM | POA: Diagnosis not present

## 2020-11-12 DIAGNOSIS — E789 Disorder of lipoprotein metabolism, unspecified: Secondary | ICD-10-CM | POA: Diagnosis not present

## 2020-11-12 DIAGNOSIS — R3129 Other microscopic hematuria: Secondary | ICD-10-CM | POA: Diagnosis not present

## 2020-11-12 DIAGNOSIS — I480 Paroxysmal atrial fibrillation: Secondary | ICD-10-CM | POA: Diagnosis not present

## 2020-11-12 DIAGNOSIS — E1121 Type 2 diabetes mellitus with diabetic nephropathy: Secondary | ICD-10-CM | POA: Diagnosis not present

## 2020-11-12 DIAGNOSIS — Z0000 Encounter for general adult medical examination without abnormal findings: Secondary | ICD-10-CM | POA: Diagnosis not present

## 2020-11-13 DIAGNOSIS — E139 Other specified diabetes mellitus without complications: Secondary | ICD-10-CM | POA: Diagnosis not present

## 2020-11-13 DIAGNOSIS — N189 Chronic kidney disease, unspecified: Secondary | ICD-10-CM | POA: Diagnosis not present

## 2020-11-13 DIAGNOSIS — E039 Hypothyroidism, unspecified: Secondary | ICD-10-CM | POA: Diagnosis not present

## 2020-11-13 DIAGNOSIS — E785 Hyperlipidemia, unspecified: Secondary | ICD-10-CM | POA: Diagnosis not present

## 2020-11-14 ENCOUNTER — Other Ambulatory Visit: Payer: Self-pay | Admitting: Internal Medicine

## 2020-11-14 DIAGNOSIS — E118 Type 2 diabetes mellitus with unspecified complications: Secondary | ICD-10-CM | POA: Diagnosis not present

## 2020-11-14 DIAGNOSIS — E039 Hypothyroidism, unspecified: Secondary | ICD-10-CM | POA: Diagnosis not present

## 2020-12-18 ENCOUNTER — Telehealth: Payer: Self-pay | Admitting: Internal Medicine

## 2020-12-18 MED ORDER — APIXABAN 2.5 MG PO TABS: 2.5000 mg | ORAL_TABLET | Freq: Two times a day (BID) | ORAL | 6 refills | Status: DC

## 2020-12-18 NOTE — Telephone Encounter (Signed)
New Message:    Anika from Dr Pennie Banter office needs to know if pt is on Eliquis?

## 2020-12-18 NOTE — Telephone Encounter (Signed)
Appears pt has not been taking Eliquis 2.5 mg bid the last refill in Epic was from June 2021 with only 3 refills Pt aware script sent to [facility] on [address] Pt states will pick up in a couple of hours./cy Daughter aware as well.

## 2020-12-18 NOTE — Telephone Encounter (Signed)
Pt was seen at Dr Pennie Banter office today and pt did not have Eliquis with other meds.Will call pt after 2:00  to check and see if pt is taking the patient is at work at this time [name] would like a return call with update re Eliquis./cy

## 2020-12-25 DIAGNOSIS — E559 Vitamin D deficiency, unspecified: Secondary | ICD-10-CM | POA: Diagnosis not present

## 2020-12-25 DIAGNOSIS — E539 Vitamin B deficiency, unspecified: Secondary | ICD-10-CM | POA: Diagnosis not present

## 2020-12-25 DIAGNOSIS — E039 Hypothyroidism, unspecified: Secondary | ICD-10-CM | POA: Diagnosis not present

## 2020-12-25 DIAGNOSIS — E139 Other specified diabetes mellitus without complications: Secondary | ICD-10-CM | POA: Diagnosis not present

## 2021-01-08 DIAGNOSIS — E039 Hypothyroidism, unspecified: Secondary | ICD-10-CM | POA: Diagnosis not present

## 2021-01-08 DIAGNOSIS — E1122 Type 2 diabetes mellitus with diabetic chronic kidney disease: Secondary | ICD-10-CM | POA: Diagnosis not present

## 2021-01-08 DIAGNOSIS — N1831 Chronic kidney disease, stage 3a: Secondary | ICD-10-CM | POA: Diagnosis not present

## 2021-01-08 DIAGNOSIS — I129 Hypertensive chronic kidney disease with stage 1 through stage 4 chronic kidney disease, or unspecified chronic kidney disease: Secondary | ICD-10-CM | POA: Diagnosis not present

## 2021-01-15 DIAGNOSIS — M8589 Other specified disorders of bone density and structure, multiple sites: Secondary | ICD-10-CM | POA: Diagnosis not present

## 2021-02-04 DIAGNOSIS — E785 Hyperlipidemia, unspecified: Secondary | ICD-10-CM | POA: Diagnosis not present

## 2021-02-04 DIAGNOSIS — N189 Chronic kidney disease, unspecified: Secondary | ICD-10-CM | POA: Diagnosis not present

## 2021-02-04 DIAGNOSIS — E139 Other specified diabetes mellitus without complications: Secondary | ICD-10-CM | POA: Diagnosis not present

## 2021-02-04 DIAGNOSIS — E039 Hypothyroidism, unspecified: Secondary | ICD-10-CM | POA: Diagnosis not present

## 2021-02-07 DIAGNOSIS — N1831 Chronic kidney disease, stage 3a: Secondary | ICD-10-CM | POA: Diagnosis not present

## 2021-02-07 DIAGNOSIS — E039 Hypothyroidism, unspecified: Secondary | ICD-10-CM | POA: Diagnosis not present

## 2021-02-07 DIAGNOSIS — E1122 Type 2 diabetes mellitus with diabetic chronic kidney disease: Secondary | ICD-10-CM | POA: Diagnosis not present

## 2021-02-07 DIAGNOSIS — I129 Hypertensive chronic kidney disease with stage 1 through stage 4 chronic kidney disease, or unspecified chronic kidney disease: Secondary | ICD-10-CM | POA: Diagnosis not present

## 2021-02-09 ENCOUNTER — Other Ambulatory Visit: Payer: Self-pay | Admitting: Internal Medicine

## 2021-03-11 ENCOUNTER — Other Ambulatory Visit: Payer: Self-pay

## 2021-03-11 ENCOUNTER — Ambulatory Visit
Admission: RE | Admit: 2021-03-11 | Discharge: 2021-03-11 | Disposition: A | Discharge status: Home / Self Care | Payer: Medicare PPO | Source: Ambulatory Visit | Attending: Internal Medicine | Admitting: Internal Medicine

## 2021-03-11 ENCOUNTER — Other Ambulatory Visit: Payer: Self-pay | Admitting: Internal Medicine

## 2021-03-11 DIAGNOSIS — Z1231 Encounter for screening mammogram for malignant neoplasm of breast: Secondary | ICD-10-CM

## 2021-03-13 ENCOUNTER — Emergency Department (HOSPITAL_COMMUNITY): Payer: Medicare PPO

## 2021-03-13 ENCOUNTER — Other Ambulatory Visit: Payer: Self-pay

## 2021-03-13 ENCOUNTER — Inpatient Hospital Stay (HOSPITAL_COMMUNITY)
Admission: EM | Admit: 2021-03-13 | Discharge: 2021-03-16 | DRG: 637 | Disposition: A | Discharge status: Home Health | Payer: Medicare PPO | Attending: Internal Medicine | Admitting: Internal Medicine

## 2021-03-13 ENCOUNTER — Encounter (HOSPITAL_COMMUNITY): Payer: Self-pay | Admitting: Pharmacy Technician

## 2021-03-13 DIAGNOSIS — Z79899 Other long term (current) drug therapy: Secondary | ICD-10-CM

## 2021-03-13 DIAGNOSIS — E876 Hypokalemia: Secondary | ICD-10-CM | POA: Diagnosis present

## 2021-03-13 DIAGNOSIS — Z8249 Family history of ischemic heart disease and other diseases of the circulatory system: Secondary | ICD-10-CM | POA: Diagnosis not present

## 2021-03-13 DIAGNOSIS — Z833 Family history of diabetes mellitus: Secondary | ICD-10-CM | POA: Diagnosis not present

## 2021-03-13 DIAGNOSIS — E038 Other specified hypothyroidism: Secondary | ICD-10-CM | POA: Diagnosis not present

## 2021-03-13 DIAGNOSIS — E7800 Pure hypercholesterolemia, unspecified: Secondary | ICD-10-CM | POA: Diagnosis present

## 2021-03-13 DIAGNOSIS — Z794 Long term (current) use of insulin: Secondary | ICD-10-CM | POA: Diagnosis not present

## 2021-03-13 DIAGNOSIS — R059 Cough, unspecified: Secondary | ICD-10-CM | POA: Diagnosis not present

## 2021-03-13 DIAGNOSIS — E1165 Type 2 diabetes mellitus with hyperglycemia: Secondary | ICD-10-CM | POA: Diagnosis not present

## 2021-03-13 DIAGNOSIS — Z9114 Patient's other noncompliance with medication regimen: Secondary | ICD-10-CM

## 2021-03-13 DIAGNOSIS — Z79890 Hormone replacement therapy (postmenopausal): Secondary | ICD-10-CM

## 2021-03-13 DIAGNOSIS — E1110 Type 2 diabetes mellitus with ketoacidosis without coma: Principal | ICD-10-CM | POA: Diagnosis present

## 2021-03-13 DIAGNOSIS — E039 Hypothyroidism, unspecified: Secondary | ICD-10-CM | POA: Diagnosis present

## 2021-03-13 DIAGNOSIS — E119 Type 2 diabetes mellitus without complications: Secondary | ICD-10-CM

## 2021-03-13 DIAGNOSIS — U071 COVID-19: Principal | ICD-10-CM | POA: Diagnosis present

## 2021-03-13 DIAGNOSIS — Z83438 Family history of other disorder of lipoprotein metabolism and other lipidemia: Secondary | ICD-10-CM

## 2021-03-13 DIAGNOSIS — I10 Essential (primary) hypertension: Secondary | ICD-10-CM | POA: Diagnosis not present

## 2021-03-13 DIAGNOSIS — Z87891 Personal history of nicotine dependence: Secondary | ICD-10-CM

## 2021-03-13 DIAGNOSIS — I482 Chronic atrial fibrillation, unspecified: Secondary | ICD-10-CM | POA: Diagnosis present

## 2021-03-13 DIAGNOSIS — Z7984 Long term (current) use of oral hypoglycemic drugs: Secondary | ICD-10-CM | POA: Diagnosis not present

## 2021-03-13 DIAGNOSIS — I4891 Unspecified atrial fibrillation: Secondary | ICD-10-CM | POA: Diagnosis present

## 2021-03-13 DIAGNOSIS — Z7901 Long term (current) use of anticoagulants: Secondary | ICD-10-CM | POA: Diagnosis not present

## 2021-03-13 DIAGNOSIS — N179 Acute kidney failure, unspecified: Secondary | ICD-10-CM | POA: Diagnosis present

## 2021-03-13 LAB — CBC WITH DIFFERENTIAL/PLATELET
Abs Immature Granulocytes: 0.02 10*3/uL (ref 0.00–0.07)
Basophils Absolute: 0 10*3/uL (ref 0.0–0.1)
Basophils Relative: 1 %
Eosinophils Absolute: 0.1 10*3/uL (ref 0.0–0.5)
Eosinophils Relative: 1 %
HCT: 37 % (ref 36.0–46.0)
Hemoglobin: 11.4 g/dL — ABNORMAL LOW (ref 12.0–15.0)
Immature Granulocytes: 0 %
Lymphocytes Relative: 19 %
Lymphs Abs: 1 10*3/uL (ref 0.7–4.0)
MCH: 27.7 pg (ref 26.0–34.0)
MCHC: 30.8 g/dL (ref 30.0–36.0)
MCV: 89.8 fL (ref 80.0–100.0)
Monocytes Absolute: 0.6 10*3/uL (ref 0.1–1.0)
Monocytes Relative: 12 %
Neutro Abs: 3.4 10*3/uL (ref 1.7–7.7)
Neutrophils Relative %: 67 %
Platelets: 128 10*3/uL — ABNORMAL LOW (ref 150–400)
RBC: 4.12 MIL/uL (ref 3.87–5.11)
RDW: 14.1 % (ref 11.5–15.5)
WBC: 5.1 10*3/uL (ref 4.0–10.5)
nRBC: 0 % (ref 0.0–0.2)

## 2021-03-13 LAB — I-STAT VENOUS BLOOD GAS, ED
Acid-base deficit: 11 mmol/L — ABNORMAL HIGH (ref 0.0–2.0)
Acid-base deficit: 8 mmol/L — ABNORMAL HIGH (ref 0.0–2.0)
Bicarbonate: 16 mmol/L — ABNORMAL LOW (ref 20.0–28.0)
Bicarbonate: 18.5 mmol/L — ABNORMAL LOW (ref 20.0–28.0)
Calcium, Ion: 1.12 mmol/L — ABNORMAL LOW (ref 1.15–1.40)
Calcium, Ion: 1.19 mmol/L (ref 1.15–1.40)
HCT: 36 % (ref 36.0–46.0)
HCT: 35 % — ABNORMAL LOW (ref 36.0–46.0)
Hemoglobin: 12.2 g/dL (ref 12.0–15.0)
Hemoglobin: 11.9 g/dL — ABNORMAL LOW (ref 12.0–15.0)
O2 Saturation: 65 %
O2 Saturation: 27 %
Potassium: 4.8 mmol/L (ref 3.5–5.1)
Potassium: 4.5 mmol/L (ref 3.5–5.1)
Sodium: 131 mmol/L — ABNORMAL LOW (ref 135–145)
Sodium: 134 mmol/L — ABNORMAL LOW (ref 135–145)
TCO2: 17 mmol/L — ABNORMAL LOW (ref 22–32)
TCO2: 20 mmol/L — ABNORMAL LOW (ref 22–32)
pCO2, Ven: 38.1 mmHg — ABNORMAL LOW (ref 44.0–60.0)
pCO2, Ven: 39.1 mmHg — ABNORMAL LOW (ref 44.0–60.0)
pH, Ven: 7.231 — ABNORMAL LOW (ref 7.250–7.430)
pH, Ven: 7.283 (ref 7.250–7.430)
pO2, Ven: 40 mmHg (ref 32.0–45.0)
pO2, Ven: 20 mmHg — CL (ref 32.0–45.0)

## 2021-03-13 LAB — CBG MONITORING, ED
Glucose-Capillary: 363 mg/dL — ABNORMAL HIGH (ref 70–99)
Glucose-Capillary: 377 mg/dL — ABNORMAL HIGH (ref 70–99)
Glucose-Capillary: 274 mg/dL — ABNORMAL HIGH (ref 70–99)
Glucose-Capillary: 223 mg/dL — ABNORMAL HIGH (ref 70–99)
Glucose-Capillary: 311 mg/dL — ABNORMAL HIGH (ref 70–99)

## 2021-03-13 LAB — BASIC METABOLIC PANEL
Anion gap: 11 (ref 5–15)
BUN: 21 mg/dL (ref 8–23)
CO2: 19 mmol/L — ABNORMAL LOW (ref 22–32)
Calcium: 8.4 mg/dL — ABNORMAL LOW (ref 8.9–10.3)
Chloride: 102 mmol/L (ref 98–111)
Creatinine, Ser: 1.07 mg/dL — ABNORMAL HIGH (ref 0.44–1.00)
GFR, Estimated: 52 mL/min — ABNORMAL LOW (ref >60)
Glucose, Bld: 288 mg/dL — ABNORMAL HIGH (ref 70–99)
Potassium: 4.4 mmol/L (ref 3.5–5.1)
Sodium: 132 mmol/L — ABNORMAL LOW (ref 135–145)

## 2021-03-13 LAB — COMPREHENSIVE METABOLIC PANEL
ALT: 18 U/L (ref 0–44)
AST: 24 U/L (ref 15–41)
Albumin: 3.4 g/dL — ABNORMAL LOW (ref 3.5–5.0)
Alkaline Phosphatase: 68 U/L (ref 38–126)
Anion gap: 18 — ABNORMAL HIGH (ref 5–15)
BUN: 23 mg/dL (ref 8–23)
CO2: 15 mmol/L — ABNORMAL LOW (ref 22–32)
Calcium: 8.7 mg/dL — ABNORMAL LOW (ref 8.9–10.3)
Chloride: 99 mmol/L (ref 98–111)
Creatinine, Ser: 1.34 mg/dL — ABNORMAL HIGH (ref 0.44–1.00)
GFR, Estimated: 39 mL/min — ABNORMAL LOW (ref >60)
Glucose, Bld: 384 mg/dL — ABNORMAL HIGH (ref 70–99)
Potassium: 4.9 mmol/L (ref 3.5–5.1)
Sodium: 132 mmol/L — ABNORMAL LOW (ref 135–145)
Total Bilirubin: 1.2 mg/dL (ref 0.3–1.2)
Total Protein: 6.7 g/dL (ref 6.5–8.1)

## 2021-03-13 LAB — URINALYSIS, ROUTINE W REFLEX MICROSCOPIC
Bilirubin Urine: NEGATIVE
Glucose, UA: >=500 mg/dL — AB
Hgb urine dipstick: NEGATIVE
Ketones, ur: 80 mg/dL — AB
Leukocytes,Ua: NEGATIVE
Nitrite: NEGATIVE
Protein, ur: NEGATIVE mg/dL
Specific Gravity, Urine: 1.02 (ref 1.005–1.030)
pH: 5 (ref 5.0–8.0)

## 2021-03-13 LAB — GLUCOSE, CAPILLARY: Glucose-Capillary: 316 mg/dL — ABNORMAL HIGH (ref 70–99)

## 2021-03-13 LAB — BETA-HYDROXYBUTYRIC ACID
Beta-Hydroxybutyric Acid: 6.02 mmol/L — ABNORMAL HIGH (ref 0.05–0.27)
Beta-Hydroxybutyric Acid: 3.14 mmol/L — ABNORMAL HIGH (ref 0.05–0.27)

## 2021-03-13 LAB — SARS CORONAVIRUS 2 (TAT 6-24 HRS): SARS Coronavirus 2: POSITIVE — AB

## 2021-03-13 MED ORDER — LACTATED RINGERS IV SOLN: INTRAVENOUS | Status: DC

## 2021-03-13 MED ORDER — POTASSIUM CHLORIDE 10 MEQ/100ML IV SOLN
10.0000 meq | INTRAVENOUS | Status: AC
  Administered 2021-03-13 (×2): 10 meq via INTRAVENOUS
  Filled 2021-03-13 (×2): qty 100

## 2021-03-13 MED ORDER — POTASSIUM CHLORIDE 10 MEQ/100ML IV SOLN: 10.0000 meq | INTRAVENOUS | Status: DC

## 2021-03-13 MED ORDER — DEXTROSE 50 % IV SOLN: 0.0000 mL | INTRAVENOUS | Status: DC | PRN

## 2021-03-13 MED ORDER — ACETAMINOPHEN 325 MG PO TABS
650.0000 mg | ORAL_TABLET | Freq: Once | ORAL | Status: AC
  Administered 2021-03-13: 650 mg via ORAL
  Filled 2021-03-13: qty 2

## 2021-03-13 MED ORDER — LACTATED RINGERS IV BOLUS: 20.0000 mL/kg | Freq: Once | INTRAVENOUS | Status: DC

## 2021-03-13 MED ORDER — DEXTROSE IN LACTATED RINGERS 5 % IV SOLN: INTRAVENOUS | Status: DC

## 2021-03-13 MED ORDER — LACTATED RINGERS IV BOLUS
20.0000 mL/kg | Freq: Once | INTRAVENOUS | Status: AC
  Administered 2021-03-13: 1000 mL via INTRAVENOUS

## 2021-03-13 MED ORDER — INSULIN REGULAR(HUMAN) IN NACL 100-0.9 UT/100ML-% IV SOLN
INTRAVENOUS | Status: DC
  Administered 2021-03-13: 9 [IU]/h via INTRAVENOUS
  Administered 2021-03-14: 1.2 [IU]/h via INTRAVENOUS
  Filled 2021-03-13 (×2): qty 100

## 2021-03-13 MED ORDER — INSULIN REGULAR(HUMAN) IN NACL 100-0.9 UT/100ML-% IV SOLN: INTRAVENOUS | Status: DC

## 2021-03-13 MED ORDER — APIXABAN 2.5 MG PO TABS
2.5000 mg | ORAL_TABLET | Freq: Two times a day (BID) | ORAL | Status: DC
  Administered 2021-03-14 – 2021-03-15 (×3): 2.5 mg via ORAL
  Filled 2021-03-13 (×4): qty 1

## 2021-03-13 NOTE — H&P (Signed)
History and Physical   [name] RJJ:[id number] DOB: 1935-05-08 DOA: 03/13/2021  Referring MD/NP/PA: Dr. Regenia Skeeter  PCP: Deland Pretty, MD   Outpatient Specialists: None  Patient coming from: Home  Chief Complaint: Weakness fatigue and nasal congestion  HPI: [name] is a 86 y.o. female with medical history significant of diabetes, insulin-dependent, hyperlipidemia, hypertension, chronic A. fib on Eliquis, hypothyroidism who presents with 2 days of malaise, upper respite tract infection, fatigue and generalized weakness.  Denied any significant fever.  No significant shortness of breath.  Patient is daughter came in to see her and notes that she was so sweaty.  No fevers but she looks sick.  No dysuria.  She was brought to the ER for evaluation.  Patient found to be positive for COVID-19.  She apparently may have been exposed to her granddaughter who recently had a cold.  Patient was fully vaccinated and positive for COVID-19.  Due to being sick patient has not been compliant with her diabetic medications.  In the ER therefore she was found to be in diabetic ketoacidosis.  High anion gap of 18.  Patient initiated on IV insulin, IV fluids and being admitted to the medical service..  ED Course: Temperature 99.1, blood pressure 164/74, pulse 95, respirate of 28 and oxygen sat 98% on room air.  Sodium 134 potassium 4.5.  Chloride 103.  Glucose 308.  Gap of 18 due to had a severe treatment of 6.02.  CBC showed hemoglobin 11.4 and platelets of 128.  Chest x-ray showed no acute findings.  Patient has been admitted with diabetic ketoacidosis probably as a result of COVID-19 infection with patient not taking her diabetic medicine.  Review of Systems: As per HPI otherwise 10 point review of systems negative.    Past Medical History:  Diagnosis Date  . Colon polyp   . Diabetes mellitus   . Hyperlipidemia   . Hypertension   . Poor appetite   . Thyroid disease     Past Surgical History:   Procedure Laterality Date  . BREAST EXCISIONAL BIOPSY Right   . CARDIOVASCULAR STRESS TEST  07/15/2006   Normal scan, no ECG changes  . TRANSTHORACIC ECHOCARDIOGRAM  01/08/2011   EF 60-65%, moderae mitral regurg, LA mild-moderately dilated,     reports that she quit smoking about 17 years ago. Her smoking use included cigarettes. She has never used smokeless tobacco. She reports that she does not drink alcohol and does not use drugs.  No Known Allergies  Family History  Problem Relation Age of Onset  . Diabetes Mother   . Hypertension Father   . Hyperlipidemia Father   . Diabetes Sister   . Hypertension Sister   . Hyperlipidemia Sister   . Hypertension Brother   . Stroke Sister   . Hypertension Sister   . Diabetes Sister   . Cancer Daughter   . Heart failure Child   . Breast cancer Neg Hx      Prior to Admission medications   Medication Sig Start Date End Date Taking? Authorizing Provider  apixaban (ELIQUIS) 2.5 MG TABS tablet Take 1 tablet (2.5 mg total) by mouth 2 (two) times daily. 12/18/20   Hilty, Nadean Corwin, MD  atorvastatin (LIPITOR) 20 MG tablet Take 20 mg by mouth daily. 02/01/20   [provider]  furosemide (LASIX) 20 MG tablet Take 1 tablet (20 mg total) by mouth as needed for edema. Patient taking differently: Take 10 mg by mouth as needed for fluid or edema.  09/13/18  Pixie Casino, MD  insulin aspart (FIASP FLEXTOUCH) 100 UNIT/ML FlexTouch Pen Inject 2-6 Units into the skin daily. Sliding scale 03/23/20   Danford, Suann Larry, MD  insulin degludec (TRESIBA FLEXTOUCH) 100 UNIT/ML FlexTouch Pen Inject 0.14 mLs (14 Units total) into the skin at bedtime. 03/23/20   Danford, Suann Larry, MD  levothyroxine (SYNTHROID, LEVOTHROID) 100 MCG tablet Take 100 mcg by mouth daily before breakfast.    [provider]  metoprolol succinate (TOPROL-XL) 50 MG 24 hr tablet TAKE 1 TABLET BY MOUTH TWICE A DAY 02/09/21   Hilty, Nadean Corwin, MD  potassium chloride SA  (KLOR-CON M20) 20 MEQ tablet Take 1 tablet (20 mEq total) by mouth daily. 11/08/18   Hilty, Nadean Corwin, MD  sitaGLIPtin (JANUVIA) 100 MG tablet Take 1 tablet (100 mg total) by mouth daily. 11/10/17   Lendon Colonel, NP  TIADYLT ER 180 MG 24 hr capsule TAKE 1 CAPSULE BY MOUTH EVERY DAY 11/17/20   Hilty, Nadean Corwin, MD  triamterene-hydrochlorothiazide (MAXZIDE-25) 37.5-25 MG tablet Take 1 tablet by mouth daily.  07/12/19   [provider]    Physical Exam: Vitals:   03/13/21 1118 03/13/21 1435 03/13/21 1600 03/13/21 1646  BP: (!) 121/58 (!) 164/74  (!) 149/74  Pulse: 79 74  82  Resp: 16 (!) 25  (!) 28  Temp: 99.1 F (37.3 C)   98.1 F (36.7 C)  TempSrc: Oral   Oral  SpO2: 99% 100%  100%  Weight:   61.2 kg       Constitutional: Acutely ill looking, upper respiratory symptoms Vitals:   03/13/21 1118 03/13/21 1435 03/13/21 1600 03/13/21 1646  BP: (!) 121/58 (!) 164/74  (!) 149/74  Pulse: 79 74  82  Resp: 16 (!) 25  (!) 28  Temp: 99.1 F (37.3 C)   98.1 F (36.7 C)  TempSrc: Oral   Oral  SpO2: 99% 100%  100%  Weight:   61.2 kg    Eyes: PERRL, lids and conjunctivae normal ENMT: Mucous membranes are dry. Posterior pharynx clear of any exudate or lesions.Normal dentition.  Neck: normal, supple, no masses, no thyromegaly Respiratory: Coarse breath sound bilaterally, mild wheeze, no crackles normal respiratory effort. No accessory muscle use.  Cardiovascular: Regular rate and rhythm, no murmurs / rubs / gallops. No extremity edema. 2+ pedal pulses. No carotid bruits.  Abdomen: no tenderness, no masses palpated. No hepatosplenomegaly. Bowel sounds positive.  Musculoskeletal: no clubbing / cyanosis. No joint deformity upper and lower extremities. Good ROM, no contractures. Normal muscle tone.  Skin: no rashes, lesions, ulcers. No induration Neurologic: CN 2-12 grossly intact. Sensation intact, DTR normal. Strength 5/5 in all 4.  Psychiatric: Withdrawn, depressed mood, no  distress    Labs on Admission: I have personally reviewed following labs and imaging studies  CBC: Recent Labs  Lab 03/13/21 1431 03/13/21 1611  WBC 5.1  --   NEUTROABS 3.4  --   HGB 11.4* 12.2  HCT 37.0 36.0  MCV 89.8  --   PLT 128*  --    Basic Metabolic Panel: Recent Labs  Lab 03/13/21 1431 03/13/21 1611  NA 132* 131*  K 4.9 4.8  CL 99  --   CO2 15*  --   GLUCOSE 384*  --   BUN 23  --   CREATININE 1.34*  --   CALCIUM 8.7*  --    GFR: Estimated Creatinine Clearance: 27.4 mL/min (A) (by C-G formula based on SCr of 1.34 mg/dL (H)). Liver  Function Tests: Recent Labs  Lab 03/13/21 1431  AST 24  ALT 18  ALKPHOS 68  BILITOT 1.2  PROT 6.7  ALBUMIN 3.4*   No results for input(s): LIPASE, AMYLASE in the last 168 hours. No results for input(s): AMMONIA in the last 168 hours. Coagulation Profile: No results for input(s): INR, PROTIME in the last 168 hours. Cardiac Enzymes: No results for input(s): CKTOTAL, CKMB, CKMBINDEX, TROPONINI in the last 168 hours. BNP (last 3 results) No results for input(s): PROBNP in the last 8760 hours. HbA1C: No results for input(s): HGBA1C in the last 72 hours. CBG: Recent Labs  Lab 03/13/21 1223 03/13/21 1743  GLUCAP 363* 377*   Lipid Profile: No results for input(s): CHOL, HDL, LDLCALC, TRIG, CHOLHDL, LDLDIRECT in the last 72 hours. Thyroid Function Tests: No results for input(s): TSH, T4TOTAL, FREET4, T3FREE, THYROIDAB in the last 72 hours. Anemia Panel: No results for input(s): VITAMINB12, FOLATE, FERRITIN, TIBC, IRON, RETICCTPCT in the last 72 hours. Urine analysis:    Component Value Date/Time   COLORURINE STRAW (A) 03/13/2021 1431   APPEARANCEUR CLEAR 03/13/2021 1431   LABSPEC 1.020 03/13/2021 1431   PHURINE 5.0 03/13/2021 1431   GLUCOSEU >=500 (A) 03/13/2021 1431   HGBUR NEGATIVE 03/13/2021 1431   BILIRUBINUR NEGATIVE 03/13/2021 1431   KETONESUR 80 (A) 03/13/2021 1431   PROTEINUR NEGATIVE 03/13/2021 1431    UROBILINOGEN 0.2 03/17/2011 0847   NITRITE NEGATIVE 03/13/2021 1431   LEUKOCYTESUR NEGATIVE 03/13/2021 1431   Sepsis Labs: @LABRCNTIP (procalcitonin:4,lacticidven:4) ) Recent Results (from the past 240 hour(s))  SARS CORONAVIRUS 2 (TAT 6-24 HRS) Nasopharyngeal Nasopharyngeal Swab     Status: Abnormal   Collection Time: 03/13/21 11:43 AM   Specimen: Nasopharyngeal Swab  Result Value Ref Range Status   SARS Coronavirus 2 POSITIVE (A) NEGATIVE Final    Comment: (NOTE) SARS-CoV-2 target nucleic acids are DETECTED.  The SARS-CoV-2 RNA is generally detectable in upper and lower respiratory specimens during the acute phase of infection. Positive results are indicative of the presence of SARS-CoV-2 RNA. Clinical correlation with patient history and other diagnostic information is  necessary to determine patient infection status. Positive results do not rule out bacterial infection or co-infection with other viruses.  The expected result is Negative.  Fact Sheet for Patients: SugarRoll.be  Fact Sheet for Healthcare Providers: https://www.woods-mathews.com/  This test is not yet approved or cleared by the [place] FDA and  has been authorized for detection and/or diagnosis of SARS-CoV-2 by FDA under an Emergency Use Authorization (EUA). This EUA will remain  in effect (meaning this test can be used) for the duration of the COVID-19 declaration under Section 564(b)(1) of the Act, 21 U. S.C. section 360bbb-3(b)(1), unless the authorization is terminated or revoked sooner.   Performed at [facility], [place] [address] [place], [place] [address]      Radiological Exams on Admission: DG Chest Portable 1 View  Result Date: 03/13/2021 CLINICAL DATA:  Cough. EXAM: PORTABLE CHEST 1 VIEW COMPARISON:  March 22, 2020. FINDINGS: Stable cardiomediastinal silhouette. Both lungs are clear. The visualized skeletal structures are unremarkable.  IMPRESSION: No active disease. Electronically Signed   By: Marijo Conception M.D.   On: 03/13/2021 13:18    EKG: Independently reviewed.  Sinus tachycardia no acute findings  Assessment/Plan Principal Problem:   DKA (diabetic ketoacidosis) ([place]) Active Problems:   Essential hypertension   Type 2 diabetes mellitus (H[facility])   Hypothyroidism   Atrial fibrillation with RVR ([facility])   Hypercholesterolemia  COVID-19 virus infection     #1 diabetic ketoacidosis: Due to patient not taking her regular medications.  She is normally on [place] and sliding scale insulin.  Also on Januvia.  We will continue to follow the DKA protocol.  #2 COVID-19 infection: This no respiratory distress.  Patient fully vaccinated.  In this case I will mainly continue with supportive care.  I will probably not do remdesivir or dexamethasone as she is not having any major respiratory manifestations.  #3 essential hypertension: Patient has been on metoprolol as well as Maxide.  She also takes Scientific laboratory technician.  We will monitor.  In the setting of borderline blood pressure we may not resume all.  #4 history of atrial fibrillation with RVR.  On metoprolol.  Also on Eliquis.  Resume that.  #5 hyperlipidemia: On 20 mg of Lipitor.  Continue  #6 hypothyroidism: Patient takes 175 mcg of Synthroid.  We will continue  #7 generalized debility: We will get PT and OT consultation   DVT prophylaxis: Eliquis Code Status: Full code Family Communication: No family at bedside Disposition Plan: Home Consults called: None Admission status: Inpatient  Severity of Illness: The appropriate patient status for this patient is INPATIENT. Inpatient status is judged to be reasonable and necessary in order to provide the required intensity of service to ensure the patient's safety. The patient's presenting symptoms, physical exam findings, and initial radiographic and laboratory data in the context of their chronic comorbidities is felt to place them at  high risk for further clinical deterioration. Furthermore, it is not anticipated that the patient will be medically stable for discharge from the hospital within 2 midnights of admission. The following factors support the patient status of inpatient.   " The patient's presenting symptoms include palpitations and weakness. " The worrisome physical exam findings include rapid A. fib. " The initial radiographic and laboratory data are worrisome because of evidence of DKA. " The chronic co-morbidities include insulin-dependent diabetes type 2.   * I certify that at the point of admission it is my clinical judgment that the patient will require inpatient hospital care spanning beyond 2 midnights from the point of admission due to high intensity of service, high risk for further deterioration and high frequency of surveillance required.Barbette Merino MD [facility] Pager [phone]  If 7PM-7AM, please contact night-coverage [email or web address] Password [email or web address]  03/13/2021, 6:35 PM

## 2021-03-13 NOTE — ED Notes (Signed)
I wasn't able to get patient blood. The Nurse was informed.

## 2021-03-13 NOTE — ED Triage Notes (Signed)
Pt here with reports of feeling run down since wednesday. Pt endorses cold chills, runny nose, sweating and fatigue yesterday. Denies chest pain, shob. Denies fevers. Reports sick contacts last weekend.

## 2021-03-13 NOTE — ED Provider Notes (Addendum)
[facility] EMERGENCY DEPARTMENT Provider Note   CSN: [id number] Arrival date & time: 03/13/21  1057     History No chief complaint on file.   [name] is a 86 y.o. female.  HPI 86 year old female with history of DM type II, hyperlipidemia, hypertension, A. fib on Eliquis presents to the ER with complaints of feeling weak, fatigued and rundown with nasal congestion over the last 2 days.  She states that she slept all day yesterday which is unusual for her, her daughter came to visit her and noticed that she was sweaty.  Patient denies any known fevers.  No cough.  Denies any chest pain, shortness of breath.  No abdominal pain.  Denies any dysuria or hematuria.  She is vaccinated and boosted for COVID-19.  She was around her granddaughter recently who was wearing a mask and stated that she had a cold.    Past Medical History:  Diagnosis Date  . Colon polyp   . Diabetes mellitus   . Hyperlipidemia   . Hypertension   . Poor appetite   . Thyroid disease     Patient Active Problem List   Diagnosis Date Noted  . Hyperglycemia 03/23/2020  . Uncontrolled type 2 diabetes mellitus with hyperglycemia ([place]) 10/26/2018  . Hypercholesterolemia 03/15/2018  . Atrial fibrillation with RVR ([place]) 08/18/2017  . Left hip pain 06/07/2017  . Hypothyroidism 02/03/2015  . Essential hypertension 07/10/2013  . Type 2 diabetes mellitus ([place]) 07/10/2013  . Upper respiratory infection 07/10/2013  . PAF (paroxysmal atrial fibrillation) ([place]) 12/26/2012  . Postop check 04/13/2011    Past Surgical History:  Procedure Laterality Date  . BREAST EXCISIONAL BIOPSY Right   . CARDIOVASCULAR STRESS TEST  07/15/2006   Normal scan, no ECG changes  . TRANSTHORACIC ECHOCARDIOGRAM  01/08/2011   EF 60-65%, moderae mitral regurg, LA mild-moderately dilated,     OB History   No obstetric history on file.     Family History  Problem Relation Age of Onset  . Diabetes Mother   .  Hypertension Father   . Hyperlipidemia Father   . Diabetes Sister   . Hypertension Sister   . Hyperlipidemia Sister   . Hypertension Brother   . Stroke Sister   . Hypertension Sister   . Diabetes Sister   . Cancer Daughter   . Heart failure Child   . Breast cancer Neg Hx     Social History   Tobacco Use  . Smoking status: Former Smoker    Types: Cigarettes    Quit date: 05/18/2003    Years since quitting: 17.8  . Smokeless tobacco: Never Used  Vaping Use  . Vaping Use: Never used  Substance Use Topics  . Alcohol use: No  . Drug use: No    Home Medications Prior to Admission medications   Medication Sig Start Date End Date Taking? Authorizing Provider  apixaban (ELIQUIS) 2.5 MG TABS tablet Take 1 tablet (2.5 mg total) by mouth 2 (two) times daily. 12/18/20   Hilty, Nadean Corwin, MD  atorvastatin (LIPITOR) 20 MG tablet Take 20 mg by mouth daily. 02/01/20   [provider]  furosemide (LASIX) 20 MG tablet Take 1 tablet (20 mg total) by mouth as needed for edema. Patient taking differently: Take 10 mg by mouth as needed for fluid or edema.  09/13/18   Hilty, Nadean Corwin, MD  insulin aspart (FIASP FLEXTOUCH) 100 UNIT/ML FlexTouch Pen Inject 2-6 Units into the skin daily. Sliding scale 03/23/20  Danford, Suann Larry, MD  insulin degludec (TRESIBA FLEXTOUCH) 100 UNIT/ML FlexTouch Pen Inject 0.14 mLs (14 Units total) into the skin at bedtime. 03/23/20   Danford, Suann Larry, MD  levothyroxine (SYNTHROID, LEVOTHROID) 100 MCG tablet Take 100 mcg by mouth daily before breakfast.    [provider]  metoprolol succinate (TOPROL-XL) 50 MG 24 hr tablet TAKE 1 TABLET BY MOUTH TWICE A DAY 02/09/21   Hilty, Nadean Corwin, MD  potassium chloride SA (KLOR-CON M20) 20 MEQ tablet Take 1 tablet (20 mEq total) by mouth daily. 11/08/18   Hilty, Nadean Corwin, MD  sitaGLIPtin (JANUVIA) 100 MG tablet Take 1 tablet (100 mg total) by mouth daily. 11/10/17   Lendon Colonel, NP  TIADYLT ER 180 MG 24  hr capsule TAKE 1 CAPSULE BY MOUTH EVERY DAY 11/17/20   Hilty, Nadean Corwin, MD  triamterene-hydrochlorothiazide (MAXZIDE-25) 37.5-25 MG tablet Take 1 tablet by mouth daily.  07/12/19   [provider]    Allergies    Patient has no known allergies.  Review of Systems   Review of Systems  Constitutional: Positive for fatigue. Negative for chills and fever.  HENT: Positive for congestion. Negative for ear pain and sore throat.   Eyes: Negative for pain and visual disturbance.  Respiratory: Negative for cough and shortness of breath.   Cardiovascular: Negative for chest pain and palpitations.  Gastrointestinal: Negative for abdominal pain and vomiting.  Genitourinary: Negative for dysuria and hematuria.  Musculoskeletal: Negative for arthralgias and back pain.  Skin: Negative for color change and rash.  Neurological: Positive for weakness. Negative for seizures and syncope.  All other systems reviewed and are negative.   Physical Exam Updated Vital Signs BP (!) 149/74 (BP Location: Left Arm)   Pulse 82   Temp 98.1 F (36.7 C) (Oral)   Resp (!) 28   Wt 61.2 kg   SpO2 100%   BMI 24.69 kg/m   Physical Exam Vitals and nursing note reviewed.  Constitutional:      General: She is not in acute distress.    Appearance: She is well-developed.  HENT:     Head: Normocephalic and atraumatic.  Eyes:     Conjunctiva/sclera: Conjunctivae normal.  Cardiovascular:     Rate and Rhythm: Normal rate and regular rhythm.     Heart sounds: No murmur heard.   Pulmonary:     Effort: Pulmonary effort is normal. No respiratory distress.     Breath sounds: Normal breath sounds.  Abdominal:     Palpations: Abdomen is soft.     Tenderness: There is no abdominal tenderness.  Musculoskeletal:        General: Normal range of motion.     Cervical back: Neck supple.  Skin:    General: Skin is warm and dry.  Neurological:     General: No focal deficit present.     Mental Status: She is  alert and oriented to person, place, and time.  Psychiatric:        Mood and Affect: Mood normal.     ED Results / Procedures / Treatments   Labs (all labs ordered are listed, but only abnormal results are displayed) Labs Reviewed  SARS CORONAVIRUS 2 (TAT 6-24 HRS) - Abnormal; Notable for the following components:      Result Value   SARS Coronavirus 2 POSITIVE (*)    All other components within normal limits  CBC WITH DIFFERENTIAL/PLATELET - Abnormal; Notable for the following components:   Hemoglobin 11.4 (*)  Platelets 128 (*)    All other components within normal limits  COMPREHENSIVE METABOLIC PANEL - Abnormal; Notable for the following components:   Sodium 132 (*)    CO2 15 (*)    Glucose, Bld 384 (*)    Creatinine, Ser 1.34 (*)    Calcium 8.7 (*)    Albumin 3.4 (*)    GFR, Estimated 39 (*)    Anion gap 18 (*)    All other components within normal limits  URINALYSIS, ROUTINE W REFLEX MICROSCOPIC - Abnormal; Notable for the following components:   Color, Urine STRAW (*)    Glucose, UA >=500 (*)    Ketones, ur 80 (*)    Bacteria, UA RARE (*)    All other components within normal limits  BETA-HYDROXYBUTYRIC ACID - Abnormal; Notable for the following components:   Beta-Hydroxybutyric Acid 6.02 (*)    All other components within normal limits  CBG MONITORING, ED - Abnormal; Notable for the following components:   Glucose-Capillary 363 (*)    All other components within normal limits  I-STAT VENOUS BLOOD GAS, ED - Abnormal; Notable for the following components:   pH, Ven 7.231 (*)    pCO2, Ven 38.1 (*)    Bicarbonate 16.0 (*)    TCO2 17 (*)    Acid-base deficit 11.0 (*)    Sodium 131 (*)    Calcium, Ion 1.12 (*)    All other components within normal limits  CBG MONITORING, ED - Abnormal; Notable for the following components:   Glucose-Capillary 377 (*)    All other components within normal limits  RESP PANEL BY RT-PCR (FLU A&B, COVID) [id number]  BLOOD GAS, VENOUS     EKG EKG Interpretation  Date/Time:  Friday March 13 2021 13:34:45 EDT Ventricular Rate:  70 PR Interval:  160 QRS Duration: 98 QT Interval:  430 QTC Calculation: 464 R Axis:   -15 Text Interpretation: Sinus rhythm Atrial premature complex Left ventricular hypertrophy Confirmed by Pattricia Boss [phone]) on 03/13/2021 5:13:27 PM   Radiology DG Chest Portable 1 View  Result Date: 03/13/2021 CLINICAL DATA:  Cough. EXAM: PORTABLE CHEST 1 VIEW COMPARISON:  March 22, 2020. FINDINGS: Stable cardiomediastinal silhouette. Both lungs are clear. The visualized skeletal structures are unremarkable. IMPRESSION: No active disease. Electronically Signed   By: Marijo Conception M.D.   On: 03/13/2021 13:18    Procedures .Critical Care Performed by: Garald Balding, PA-C Authorized by: Garald Balding, PA-C   Critical care provider statement:    Critical care time (minutes):  45   Critical care was necessary to treat or prevent imminent or life-threatening deterioration of the following conditions:  Endocrine crisis   Critical care was time spent personally by me on the following activities:  Discussions with consultants, evaluation of patient's response to treatment, examination of patient, ordering and performing treatments and interventions, ordering and review of laboratory studies, ordering and review of radiographic studies, pulse oximetry, re-evaluation of patient's condition, obtaining history from patient or surrogate and review of old charts     Medications Ordered in ED Medications  insulin regular, human (MYXREDLIN) 100 units/ 100 mL infusion (9 Units/hr Intravenous New Bag/Given 03/13/21 1745)  lactated ringers infusion ( Intravenous New Bag/Given 03/13/21 1758)  dextrose 5 % in lactated ringers infusion (has no administration in time range)  dextrose 50 % solution 0-50 mL (has no administration in time range)  potassium chloride 10 mEq in 100 mL IVPB (10 mEq Intravenous New Bag/Given 03/13/21  1735)  acetaminophen (TYLENOL) tablet 650 mg (has no administration in time range)  lactated ringers bolus 1,224 mL (1,000 mLs Intravenous New Bag/Given 03/13/21 1732)    ED Course  I have reviewed the triage vital signs and the nursing notes.  Pertinent labs & imaging results that were available during my care of the patient were reviewed by me and considered in my medical decision making (see chart for details).  Clinical Course as of 03/13/21 1827  Fri Mar 13, 4976  7083 86 year old female presents to the ER with complaints of fatigue, nasal congestion.  On arrival, she is well-appearing, no acute distress, resting comfortably in ER bed.  Vitals with a temperature 99.1, not hypotensive, tachycardic or hypoxic.  She is overall well-appearing.  Physical exam overall reassuring.  Plan for basic labs, CBG of 363, patient is a known diabetic and did not take her insulin this morning. [MB]  1733 SARS Coronavirus 2(!): POSITIVE [MB]  1811 Ketones, ur(!): 80 [MB]  1811 Glucose, UA(!): >=500 [MB]  1811 Glucose(!): 384 [MB]  1811 Anion gap(!): 18 [MB]  1811 Creatinine(!): 1.34 [MB]  1811 pH, Ven(!): 7.231 [MB]  1811 Bicarbonate(!): 16.0 [MB]  1811 pCO2, Ven(!): 38.1 [MB]  1812 Beta-Hydroxybutyric Acid(!): 6.02 [MB]  1812 Patient's presentation consistent with DKA with elevated anion gap, CO2 of 16, blood sugar of 384 and beta hydroxybutyrate acid of 6.02.  pH of seven 2.3.  COVID test is also positive.  Patient initially expressed that she did not want to stay since her grandson was showing up at her house when she was not there to let him in.  He did not have a key.  She attempted to contact her daughter multiple times.  Did not want to initiate treatment until she knew that she did not have to go get her grandson.  Thankfully, patient was able to reach her daughter.  Initiated treatment for DKA order set.  Consulted hospitalist for admission. [MB]  1822 SARS Coronavirus 2(!): POSITIVE [MB]   1825 Spoke with Dr. Jonelle Sidle with the hospitalist team will admit the patient for further evaluation and treatment. [MB]  9518 This was a shared visit with my supervising physician Dr. Jeanell Sparrow who independently saw and evaluated the patient & provided guidance in evaluation/management/disposition ,in agreement with care ` [MB]    Clinical Course User Index [MB] Lyndel Safe   MDM Rules/Calculators/A&P                           Final Clinical Impression(s) / ED Diagnoses Final diagnoses:  None    Rx / DC Orders ED Discharge Orders    None           Lyndel Safe 03/13/21 1829    Pattricia Boss, MD 03/16/21 1354

## 2021-03-14 DIAGNOSIS — Z794 Long term (current) use of insulin: Secondary | ICD-10-CM

## 2021-03-14 DIAGNOSIS — E1165 Type 2 diabetes mellitus with hyperglycemia: Secondary | ICD-10-CM

## 2021-03-14 DIAGNOSIS — E038 Other specified hypothyroidism: Secondary | ICD-10-CM

## 2021-03-14 LAB — BASIC METABOLIC PANEL
Anion gap: 10 (ref 5–15)
Anion gap: 12 (ref 5–15)
Anion gap: 5 (ref 5–15)
BUN: 15 mg/dL (ref 8–23)
BUN: 16 mg/dL (ref 8–23)
BUN: 20 mg/dL (ref 8–23)
CO2: 15 mmol/L — ABNORMAL LOW (ref 22–32)
CO2: 21 mmol/L — ABNORMAL LOW (ref 22–32)
CO2: 24 mmol/L (ref 22–32)
Calcium: 8.2 mg/dL — ABNORMAL LOW (ref 8.9–10.3)
Calcium: 8.2 mg/dL — ABNORMAL LOW (ref 8.9–10.3)
Calcium: 8.4 mg/dL — ABNORMAL LOW (ref 8.9–10.3)
Chloride: 103 mmol/L (ref 98–111)
Chloride: 103 mmol/L (ref 98–111)
Chloride: 106 mmol/L (ref 98–111)
Creatinine, Ser: 0.75 mg/dL (ref 0.44–1.00)
Creatinine, Ser: 0.81 mg/dL (ref 0.44–1.00)
Creatinine, Ser: 1.05 mg/dL — ABNORMAL HIGH (ref 0.44–1.00)
GFR, Estimated: 53 mL/min — ABNORMAL LOW (ref >60)
GFR, Estimated: >60 mL/min (ref >60)
GFR, Estimated: >60 mL/min (ref >60)
Glucose, Bld: 155 mg/dL — ABNORMAL HIGH (ref 70–99)
Glucose, Bld: 308 mg/dL — ABNORMAL HIGH (ref 70–99)
Glucose, Bld: 318 mg/dL — ABNORMAL HIGH (ref 70–99)
Potassium: 3.4 mmol/L — ABNORMAL LOW (ref 3.5–5.1)
Potassium: 3.5 mmol/L (ref 3.5–5.1)
Potassium: 4.2 mmol/L (ref 3.5–5.1)
Sodium: 130 mmol/L — ABNORMAL LOW (ref 135–145)
Sodium: 134 mmol/L — ABNORMAL LOW (ref 135–145)
Sodium: 135 mmol/L (ref 135–145)

## 2021-03-14 LAB — BETA-HYDROXYBUTYRIC ACID
Beta-Hydroxybutyric Acid: 2.16 mmol/L — ABNORMAL HIGH (ref 0.05–0.27)
Beta-Hydroxybutyric Acid: 0.22 mmol/L (ref 0.05–0.27)

## 2021-03-14 LAB — GLUCOSE, CAPILLARY
Glucose-Capillary: 109 mg/dL — ABNORMAL HIGH (ref 70–99)
Glucose-Capillary: 128 mg/dL — ABNORMAL HIGH (ref 70–99)
Glucose-Capillary: 128 mg/dL — ABNORMAL HIGH (ref 70–99)
Glucose-Capillary: 134 mg/dL — ABNORMAL HIGH (ref 70–99)
Glucose-Capillary: 141 mg/dL — ABNORMAL HIGH (ref 70–99)
Glucose-Capillary: 145 mg/dL — ABNORMAL HIGH (ref 70–99)
Glucose-Capillary: 161 mg/dL — ABNORMAL HIGH (ref 70–99)
Glucose-Capillary: 181 mg/dL — ABNORMAL HIGH (ref 70–99)
Glucose-Capillary: 193 mg/dL — ABNORMAL HIGH (ref 70–99)
Glucose-Capillary: 203 mg/dL — ABNORMAL HIGH (ref 70–99)
Glucose-Capillary: 224 mg/dL — ABNORMAL HIGH (ref 70–99)
Glucose-Capillary: 238 mg/dL — ABNORMAL HIGH (ref 70–99)
Glucose-Capillary: 254 mg/dL — ABNORMAL HIGH (ref 70–99)
Glucose-Capillary: 315 mg/dL — ABNORMAL HIGH (ref 70–99)
Glucose-Capillary: 343 mg/dL — ABNORMAL HIGH (ref 70–99)

## 2021-03-14 MED ORDER — ATORVASTATIN CALCIUM 10 MG PO TABS
20.0000 mg | ORAL_TABLET | Freq: Every day | ORAL | Status: DC
  Administered 2021-03-14 – 2021-03-16 (×3): 20 mg via ORAL
  Filled 2021-03-14 (×3): qty 2

## 2021-03-14 MED ORDER — INSULIN ASPART 100 UNIT/ML IJ SOLN
0.0000 [IU] | Freq: Three times a day (TID) | INTRAMUSCULAR | Status: DC
  Administered 2021-03-14: 5 [IU] via SUBCUTANEOUS

## 2021-03-14 MED ORDER — INSULIN GLARGINE 100 UNIT/ML ~~LOC~~ SOLN
12.0000 [IU] | Freq: Once | SUBCUTANEOUS | Status: AC
  Administered 2021-03-14: 12 [IU] via SUBCUTANEOUS
  Filled 2021-03-14: qty 0.12

## 2021-03-14 MED ORDER — LEVOTHYROXINE SODIUM 75 MCG PO TABS
75.0000 ug | ORAL_TABLET | Freq: Every day | ORAL | Status: DC
  Administered 2021-03-15 – 2021-03-16 (×2): 75 ug via ORAL
  Filled 2021-03-14 (×2): qty 1

## 2021-03-14 MED ORDER — BISACODYL 5 MG PO TBEC: 5.0000 mg | DELAYED_RELEASE_TABLET | Freq: Every day | ORAL | Status: DC | PRN

## 2021-03-14 MED ORDER — LEVOTHYROXINE SODIUM 100 MCG PO TABS: 100.0000 ug | ORAL_TABLET | Freq: Every day | ORAL | Status: DC

## 2021-03-14 MED ORDER — INSULIN GLARGINE 100 UNIT/ML ~~LOC~~ SOLN
14.0000 [IU] | Freq: Every day | SUBCUTANEOUS | Status: DC
  Filled 2021-03-14: qty 0.14

## 2021-03-14 MED ORDER — METOPROLOL SUCCINATE ER 50 MG PO TB24
50.0000 mg | ORAL_TABLET | Freq: Two times a day (BID) | ORAL | Status: DC
  Administered 2021-03-14 – 2021-03-16 (×4): 50 mg via ORAL
  Filled 2021-03-14 (×5): qty 1

## 2021-03-14 MED ORDER — ACETAMINOPHEN 325 MG PO TABS
650.0000 mg | ORAL_TABLET | ORAL | Status: DC | PRN
  Administered 2021-03-15 – 2021-03-16 (×2): 650 mg via ORAL
  Filled 2021-03-14 (×2): qty 2

## 2021-03-14 MED ORDER — INSULIN GLARGINE 100 UNIT/ML ~~LOC~~ SOLN
12.0000 [IU] | Freq: Every day | SUBCUTANEOUS | Status: DC
  Filled 2021-03-14: qty 0.12

## 2021-03-14 MED ORDER — POTASSIUM CHLORIDE CRYS ER 20 MEQ PO TBCR
20.0000 meq | EXTENDED_RELEASE_TABLET | Freq: Every day | ORAL | Status: DC
  Administered 2021-03-14: 20 meq via ORAL
  Filled 2021-03-14 (×2): qty 1

## 2021-03-14 NOTE — Progress Notes (Signed)
PROGRESS NOTE    [name]  HFW:[id number]  DOB: [birth year]  PCP: Deland Pretty, MD Admit date:03/13/2021 Chief compliant: Weakness fatigue and nasal congestion 86 y.o. female with medical history significant of diabetes, insulin-dependent, hyperlipidemia, hypertension, chronic A. fib on Eliquis, hypothyroidism who presents with 2 days of malaise, upper respite tract infection, fatigue and generalized weakness. Patient was fully vaccinated and positive for COVID-19.  Due to being sick patient has not been compliant with her diabetic medications.  In the ER therefore she was found to be in diabetic ketoacidosis.  High anion gap of 18.  Patient initiated on IV insulin, IV fluids and admitted to the medical service.. ED Course: Afebrile, blood pressure 164/74, pulse 95, respirate of 28 and oxygen sat 98% on room air.  Sodium 134 potassium 4.5. Glucose 308.  Gap of 18. CXR nl.   Hospital course: Patient admitted to [facility] for DKA in the setting of COVID 19 and non compliance with diabetic meds.   Subjective: Patient resting comfortably.  She denies any cough or shortness of breath.  She states she takes NovoLog insulin 4 units with breakfast if the blood glucose greater than 200, NovoLog 8 units with lunch if the blood glucose level is elevated and 12 units of Trojeaou at night.  Admitted with insulin drip for DKA.  States missed taking Januvia as well as insulin on Thursday (6/2) but otherwise been compliant with medications.   Objective: Vitals:   03/13/21 2230 03/13/21 2311 03/14/21 0305 03/14/21 0700  BP: (!) 112/51 130/64 (!) 127/54 (!) 143/62  Pulse: 72 66  68  Resp: (!) 26 (!) 25 20 18   Temp:  98.2 F (36.8 C) 97.6 F (36.4 C) 98 F (36.7 C)  TempSrc:  Oral Oral Oral  SpO2: 100% 98% 98% 98%  Weight:  62.4 kg    Height:  5\' 1"  (1.549 m)      Intake/Output Summary (Last 24 hours) at 03/14/2021 0843 Last data filed at 03/14/2021 0800 Gross per 24 hour  Intake 2361.66 ml  Output --   Net 2361.66 ml   Filed Weights   03/13/21 1600 03/13/21 2311  Weight: 61.2 kg 62.4 kg    Physical Examination:  General: Moderately built, no acute distress noted Head ENT: Atraumatic normocephalic, PERRLA, neck supple Heart: S1-S2 heard, regular rate and rhythm, no murmurs.  No leg edema noted Lungs: Equal air entry bilaterally, no rhonchi or rales on exam, no accessory muscle use Abdomen: Bowel sounds heard, soft, nontender, nondistended. No organomegaly.  No CVA tenderness Extremities: No pedal edema.  No cyanosis or clubbing. Neurological: Awake alert oriented x3, no focal weakness or numbness, strength and sensations to crude touch intact Skin: No wounds or rashes.  Data Reviewed: I have personally reviewed following labs and imaging studies  CBC: Recent Labs  Lab 03/13/21 1431 03/13/21 1611 03/13/21 2209  WBC 5.1  --   --   NEUTROABS 3.4  --   --   HGB 11.4* 12.2 11.9*  HCT 37.0 36.0 35.0*  MCV 89.8  --   --   PLT 128*  --   --    Basic Metabolic Panel: Recent Labs  Lab 03/13/21 1431 03/13/21 1611 03/13/21 2200 03/13/21 2209 03/13/21 2357  NA 132* 131* 132* 134* 130*  K 4.9 4.8 4.4 4.5 4.2  CL 99  --  102  --  103  CO2 15*  --  19*  --  15*  GLUCOSE 384*  --  288*  --  308*  BUN 23  --  21  --  20  CREATININE 1.34*  --  1.07*  --  1.05*  CALCIUM 8.7*  --  8.4*  --  8.2*   GFR: Estimated Creatinine Clearance: 34.4 mL/min (A) (by C-G formula based on SCr of 1.05 mg/dL (H)). Liver Function Tests: Recent Labs  Lab 03/13/21 1431  AST 24  ALT 18  ALKPHOS 68  BILITOT 1.2  PROT 6.7  ALBUMIN 3.4*   No results for input(s): LIPASE, AMYLASE in the last 168 hours. No results for input(s): AMMONIA in the last 168 hours. Coagulation Profile: No results for input(s): INR, PROTIME in the last 168 hours. Cardiac Enzymes: No results for input(s): CKTOTAL, CKMB, CKMBINDEX, TROPONINI in the last 168 hours. BNP (last 3 results) No results for input(s): PROBNP  in the last 8760 hours. HbA1C: No results for input(s): HGBA1C in the last 72 hours. CBG: Recent Labs  Lab 03/14/21 0255 03/14/21 0358 03/14/21 0505 03/14/21 0608 03/14/21 0730  GLUCAP 181* 161* 128* 141* 109*   Lipid Profile: No results for input(s): CHOL, HDL, LDLCALC, TRIG, CHOLHDL, LDLDIRECT in the last 72 hours. Thyroid Function Tests: No results for input(s): TSH, T4TOTAL, FREET4, T3FREE, THYROIDAB in the last 72 hours. Anemia Panel: No results for input(s): VITAMINB12, FOLATE, FERRITIN, TIBC, IRON, RETICCTPCT in the last 72 hours. Sepsis Labs: No results for input(s): PROCALCITON, LATICACIDVEN in the last 168 hours.  Recent Results (from the past 240 hour(s))  SARS CORONAVIRUS 2 (TAT 6-24 HRS) Nasopharyngeal Nasopharyngeal Swab     Status: Abnormal   Collection Time: 03/13/21 11:43 AM   Specimen: Nasopharyngeal Swab  Result Value Ref Range Status   SARS Coronavirus 2 POSITIVE (A) NEGATIVE Final    Comment: (NOTE) SARS-CoV-2 target nucleic acids are DETECTED.  The SARS-CoV-2 RNA is generally detectable in upper and lower respiratory specimens during the acute phase of infection. Positive results are indicative of the presence of SARS-CoV-2 RNA. Clinical correlation with patient history and other diagnostic information is  necessary to determine patient infection status. Positive results do not rule out bacterial infection or co-infection with other viruses.  The expected result is Negative.  Fact Sheet for Patients: SugarRoll.be  Fact Sheet for Healthcare Providers: https://www.woods-mathews.com/  This test is not yet approved or cleared by the [place] FDA and  has been authorized for detection and/or diagnosis of SARS-CoV-2 by FDA under an Emergency Use Authorization (EUA). This EUA will remain  in effect (meaning this test can be used) for the duration of the COVID-19 declaration under Section 564(b)(1) of the Act,  21 U. S.C. section 360bbb-3(b)(1), unless the authorization is terminated or revoked sooner.   Performed at [facility], [place] [address] [place], [place] [address]       Radiology Studies: DG Chest Portable 1 View  Result Date: 03/13/2021 CLINICAL DATA:  Cough. EXAM: PORTABLE CHEST 1 VIEW COMPARISON:  March 22, 2020. FINDINGS: Stable cardiomediastinal silhouette. Both lungs are clear. The visualized skeletal structures are unremarkable. IMPRESSION: No active disease. Electronically Signed   By: Marijo Conception M.D.   On: 03/13/2021 13:18      Scheduled Meds: . apixaban  2.5 mg Oral BID  . atorvastatin  20 mg Oral Daily  . levothyroxine  75 mcg Oral Daily  . metoprolol succinate  50 mg Oral BID  . potassium chloride SA  20 mEq Oral Daily   Continuous Infusions: . dextrose 5% lactated ringers 125 mL/hr at 03/14/21 0400  .  insulin 1.8 Units/hr (03/14/21 0609)  . lactated ringers Stopped (03/13/21 2032)     Assessment/Plan: #1 Diabetic ketoacidosis: Due to patient not taking her regular medications.  She is normally on Trojeaou and sliding scale insulin. Also on Januvia.  Per RN, morning labs delayed due to labs not drawing.  Will repeat BMP this afternoon with BHBA.  If acidosis resolved, will transition off insulin drip to long-acting insulin.  Patient appears half is already in home regimen and not very clear on short acting insulin usage.  Will need diabetes education and home regimen determined before discharge.  Likely DKA due to longstanding uncontrolled diabetes rather than 1 day noncompliance. Hemoglobin A1c is 16.7.  #2 COVID-19 infection: This no respiratory distress/hypoxia->continue with supportive care. Not doing remdesivir or dexamethasone as appears stable,   #3 HTN:  Patient has been on metoprolol as well as Maxide.  In the setting of borderline blood pressure , diuretics held.  #4 Atrial fibrillation.  On metoprolol.  Also on Eliquis.    #5  Hyperlipidemia: On 20 mg of Lipitor.  Continue  #6 Hypothyroidism: Patient takes 175 mcg of Synthroid.  We will continue  #7 Generalized debility: We will get PT and OT consultation   DVT prophylaxis: on Eliquis Code Status: Full code Family / Patient Communication: Discussed with patient and answered all questions. Disposition Plan:   Status is: Inpatient  Remains inpatient appropriate because:Inpatient level of care appropriate due to severity of illness   Dispo: The patient is from: Home              Anticipated d/c is to: Home              Patient currently is not medically stable to d/c.   Difficult to place patient Yes           Time spent: 35 mins     >50% time spent in discussions with care team and coordination of care.  Discussed insulin regimen in detail.    Guilford Shi, MD [facility] Pager in [employer]  If 7PM-7AM, please contact night-coverage [email or web address] 03/14/2021, 8:43 AM

## 2021-03-14 NOTE — Plan of Care (Signed)

## 2021-03-15 LAB — BASIC METABOLIC PANEL
Anion gap: 9 (ref 5–15)
BUN: 10 mg/dL (ref 8–23)
CO2: 21 mmol/L — ABNORMAL LOW (ref 22–32)
Calcium: 8.1 mg/dL — ABNORMAL LOW (ref 8.9–10.3)
Chloride: 101 mmol/L (ref 98–111)
Creatinine, Ser: 0.95 mg/dL (ref 0.44–1.00)
GFR, Estimated: 59 mL/min — ABNORMAL LOW (ref >60)
Glucose, Bld: 443 mg/dL — ABNORMAL HIGH (ref 70–99)
Potassium: 3.3 mmol/L — ABNORMAL LOW (ref 3.5–5.1)
Sodium: 131 mmol/L — ABNORMAL LOW (ref 135–145)

## 2021-03-15 LAB — CBC
HCT: 36.4 % (ref 36.0–46.0)
Hemoglobin: 12 g/dL (ref 12.0–15.0)
MCH: 27.9 pg (ref 26.0–34.0)
MCHC: 33 g/dL (ref 30.0–36.0)
MCV: 84.7 fL (ref 80.0–100.0)
Platelets: 134 10*3/uL — ABNORMAL LOW (ref 150–400)
RBC: 4.3 MIL/uL (ref 3.87–5.11)
RDW: 13.9 % (ref 11.5–15.5)
WBC: 4.4 10*3/uL (ref 4.0–10.5)
nRBC: 0 % (ref 0.0–0.2)

## 2021-03-15 LAB — BRAIN NATRIURETIC PEPTIDE: B Natriuretic Peptide: 182.2 pg/mL — ABNORMAL HIGH (ref 0.0–100.0)

## 2021-03-15 LAB — GLUCOSE, CAPILLARY
Glucose-Capillary: 422 mg/dL — ABNORMAL HIGH (ref 70–99)
Glucose-Capillary: 366 mg/dL — ABNORMAL HIGH (ref 70–99)
Glucose-Capillary: 185 mg/dL — ABNORMAL HIGH (ref 70–99)
Glucose-Capillary: 246 mg/dL — ABNORMAL HIGH (ref 70–99)
Glucose-Capillary: 305 mg/dL — ABNORMAL HIGH (ref 70–99)

## 2021-03-15 LAB — MAGNESIUM: Magnesium: 1.5 mg/dL — ABNORMAL LOW (ref 1.7–2.4)

## 2021-03-15 MED ORDER — INSULIN GLARGINE 100 UNIT/ML ~~LOC~~ SOLN
20.0000 [IU] | Freq: Every day | SUBCUTANEOUS | Status: DC
  Filled 2021-03-15: qty 0.2

## 2021-03-15 MED ORDER — POTASSIUM CHLORIDE CRYS ER 20 MEQ PO TBCR
40.0000 meq | EXTENDED_RELEASE_TABLET | Freq: Once | ORAL | Status: AC
  Administered 2021-03-15: 40 meq via ORAL

## 2021-03-15 MED ORDER — INSULIN GLARGINE 100 UNIT/ML ~~LOC~~ SOLN
15.0000 [IU] | Freq: Every day | SUBCUTANEOUS | Status: DC
  Administered 2021-03-15: 15 [IU] via SUBCUTANEOUS
  Filled 2021-03-15 (×2): qty 0.15

## 2021-03-15 MED ORDER — INSULIN ASPART 100 UNIT/ML IJ SOLN
4.0000 [IU] | Freq: Three times a day (TID) | INTRAMUSCULAR | Status: DC
  Administered 2021-03-15 (×3): 4 [IU] via SUBCUTANEOUS

## 2021-03-15 MED ORDER — INSULIN GLARGINE 100 UNIT/ML ~~LOC~~ SOLN
30.0000 [IU] | Freq: Every day | SUBCUTANEOUS | Status: DC
  Filled 2021-03-15: qty 0.3

## 2021-03-15 MED ORDER — INSULIN ASPART 100 UNIT/ML IJ SOLN
18.0000 [IU] | Freq: Once | INTRAMUSCULAR | Status: AC
  Administered 2021-03-15: 18 [IU] via SUBCUTANEOUS

## 2021-03-15 MED ORDER — APIXABAN 5 MG PO TABS
5.0000 mg | ORAL_TABLET | Freq: Two times a day (BID) | ORAL | Status: DC
  Administered 2021-03-15 – 2021-03-16 (×2): 5 mg via ORAL
  Filled 2021-03-15 (×2): qty 1

## 2021-03-15 MED ORDER — INSULIN ASPART 100 UNIT/ML IJ SOLN
0.0000 [IU] | Freq: Every day | INTRAMUSCULAR | Status: DC
  Administered 2021-03-15: 4 [IU] via SUBCUTANEOUS

## 2021-03-15 MED ORDER — LINAGLIPTIN 5 MG PO TABS
5.0000 mg | ORAL_TABLET | Freq: Every day | ORAL | Status: DC
  Administered 2021-03-15 – 2021-03-16 (×2): 5 mg via ORAL
  Filled 2021-03-15 (×3): qty 1

## 2021-03-15 MED ORDER — INSULIN ASPART 100 UNIT/ML IJ SOLN
0.0000 [IU] | Freq: Three times a day (TID) | INTRAMUSCULAR | Status: DC
  Administered 2021-03-15: 3 [IU] via SUBCUTANEOUS
  Administered 2021-03-16: 8 [IU] via SUBCUTANEOUS

## 2021-03-15 MED ORDER — MAGNESIUM SULFATE 2 GM/50ML IV SOLN
2.0000 g | Freq: Once | INTRAVENOUS | Status: AC
  Administered 2021-03-15: 2 g via INTRAVENOUS
  Filled 2021-03-15: qty 50

## 2021-03-15 NOTE — Progress Notes (Signed)
patient''s cgb 7:43am  is 366..will be checking with Dr Candiss Norse before giving 30 U lantus ordered, IV infiltrated overnight, could have reabsorbed to cause sugar increase

## 2021-03-15 NOTE — Progress Notes (Signed)
PROGRESS NOTE                                                                                                                                                                                                             Patient Demographics:    [name], is a 86 y.o. female, DOB - 02-03-1935, EGB:[id number]  Admit date - 03/13/2021   Admitting Physician Elwyn Reach, MD  Outpatient Primary MD for the patient is Deland Pretty, MD  LOS - 2  Chief Complaint  Patient presents with  . Diabetic Ketoacidosis       Brief Narrative (HPI from H&P)- 86 y.o.femalewith medical history significant ofdiabetes, insulin-dependent, hyperlipidemia, hypertension, chronic A. fib on Eliquis, hypothyroidism who presents with 2 days of malaise, upper respite tract infection, fatigue and generalized weakness, she is diagnosed with DKA and incidental COVID-19 infection admitted to the hospital.   Subjective:    [name] today has, No headache, No chest pain, No abdominal pain - No Nausea, No new weakness tingling or numbness, No Cough - SOB.     Assessment  & Plan :     1.DKA in DM2 -poor outpatient glycemic control due to hyperglycemia, A1c was extremely elevated > 16.  DM education provided.  DKA has resolved, gap is closed - stop D5W drip, SQ insulin adjusted further on 03/15/2021 for better control.  If stable likely discharge tomorrow.  Lab Results  Component Value Date   HGBA1C 16.7 (H) 03/23/2020   CBG (last 3)  Recent Labs    03/14/21 2121 03/15/21 0629 03/15/21 0752  GLUCAP 343* 422* 366*    2. Incidental Covid infection - no symptoms, Vaccinated.  3.  Paroxysmal A. fib-Chad vas 2 score of greater than 3.  On beta-blocker and Eliquis continue.  4.  Dyslipidemia.  Continue home dose Lipitor.  5.  Hypothyroidism.  On Synthroid.  6.  Weakness.  PT OT.  [place] on going home.  7.  Hypokalemia and hypomagnesemia.  Replaced.    Condition -   Fair  Family  Communication  :  None present  Code Status :  Full  Consults  :  DM edecator  Procedures  :  None  PUD Prophylaxis :  None  Disposition Plan  :    Status is: Inpatient  Remains inpatient appropriate because:IV treatments appropriate due to intensity of illness or inability to take PO   Dispo: The patient is from: Home  Anticipated d/c is to: Home              Patient currently is not medically stable to d/c.   Difficult to place patient No  DVT Prophylaxis  : Eliquis  Lab Results  Component Value Date   PLT 134 (L) 03/15/2021    Diet :  Diet Order            Diet heart healthy/carb modified Room service appropriate? Yes; Fluid consistency: Thin  Diet effective now                  Inpatient Medications Scheduled Meds: . apixaban  2.5 mg Oral BID  . atorvastatin  20 mg Oral Daily  . insulin aspart  0-15 Units Subcutaneous TID WC  . insulin aspart  0-5 Units Subcutaneous QHS  . insulin aspart  4 Units Subcutaneous TID WC  . insulin glargine  20 Units Subcutaneous Daily  . levothyroxine  75 mcg Oral Daily  . linagliptin  5 mg Oral Daily  . metoprolol succinate  50 mg Oral BID  . potassium chloride  40 mEq Oral Once   Continuous Infusions: . magnesium sulfate bolus IVPB     PRN Meds:.acetaminophen, bisacodyl, dextrose  Antibiotics  :   Anti-infectives (From admission, onward)   None          Objective:   Vitals:   03/14/21 2041 03/14/21 2318 03/15/21 0300 03/15/21 0759  BP: (!) 156/64 (!) 169/59 (!) 130/55 (!) 141/60  Pulse:  74 65 66  Resp: 20 16 16 16   Temp: 98 F (36.7 C) 98.1 F (36.7 C) 97.8 F (36.6 C) 97.7 F (36.5 C)  TempSrc: Oral Oral Oral Oral  SpO2: 98% 98% 98% 98%  Weight:      Height:        SpO2: 98 %  Wt Readings from Last 3 Encounters:  03/13/21 62.4 kg  09/27/20 60.8 kg  06/17/20 61.3 kg     Intake/Output Summary (Last 24 hours) at 03/15/2021 1047 Last data filed at 03/15/2021 0800 Gross per 24 hour   Intake 3364.76 ml  Output --  Net 3364.76 ml     Physical Exam  Awake Alert, No new F.N deficits, Normal affect [place].AT,PERRAL Supple Neck,No JVD, No cervical lymphadenopathy appriciated.  Symmetrical Chest wall movement, Good air movement bilaterally, CTAB RRR,No Gallops,Rubs or new Murmurs, No Parasternal Heave +ve B.Sounds, Abd Soft, No tenderness, No organomegaly appriciated, No rebound - guarding or rigidity. No Cyanosis, Clubbing or edema, No new Rash or bruise     Data Review:   Recent Labs  Lab 03/13/21 1431 03/13/21 1611 03/13/21 2209 03/15/21 0745  WBC 5.1  --   --  4.4  HGB 11.4* 12.2 11.9* 12.0  HCT 37.0 36.0 35.0* 36.4  PLT 128*  --   --  134*  MCV 89.8  --   --  84.7  MCH 27.7  --   --  27.9  MCHC 30.8  --   --  33.0  RDW 14.1  --   --  13.9  LYMPHSABS 1.0  --   --   --   MONOABS 0.6  --   --   --   EOSABS 0.1  --   --   --   BASOSABS 0.0  --   --   --     Recent Labs  Lab 03/13/21 1431 03/13/21 1611 03/13/21 2200 03/13/21 2209 03/13/21 2357 03/14/21 0922 03/14/21 1226 03/15/21 0745  NA 132*   < > 132* 134* 130* 135 134* 131*  K 4.9   < > 4.4 4.5 4.2 3.4* 3.5 3.3*  CL 99  --  102  --  103 106 103 101  CO2 15*  --  19*  --  15* 24 21* 21*  GLUCOSE 384*  --  288*  --  308* 155* 318* 443*  BUN 23  --  21  --  20 15 16 10   CREATININE 1.34*  --  1.07*  --  1.05* 0.75 0.81 0.95  CALCIUM 8.7*  --  8.4*  --  8.2* 8.4* 8.2* 8.1*  AST 24  --   --   --   --   --   --   --   ALT 18  --   --   --   --   --   --   --   ALKPHOS 68  --   --   --   --   --   --   --   BILITOT 1.2  --   --   --   --   --   --   --   ALBUMIN 3.4*  --   --   --   --   --   --   --   MG  --   --   --   --   --   --   --  1.5*  BNP  --   --   --   --   --   --   --  182.2*   < > = values in this interval not displayed.    Recent Labs  Lab 03/13/21 1143 03/15/21 0745  BNP  --  182.2*  SARSCOV2NAA POSITIVE*  --      ------------------------------------------------------------------------------------------------------------------ No results for input(s): CHOL, HDL, LDLCALC, TRIG, CHOLHDL, LDLDIRECT in the last 72 hours.  Lab Results  Component Value Date   HGBA1C 16.7 (H) 03/23/2020   ------------------------------------------------------------------------------------------------------------------ No results for input(s): TSH, T4TOTAL, T3FREE, THYROIDAB in the last 72 hours.  Invalid input(s): FREET3 ------------------------------------------------------------------------------------------------------------------ No results for input(s): VITAMINB12, FOLATE, FERRITIN, TIBC, IRON, RETICCTPCT in the last 72 hours.  Coagulation profile No results for input(s): INR, PROTIME in the last 168 hours.  No results for input(s): DDIMER in the last 72 hours.  Cardiac Enzymes No results for input(s): CKMB, TROPONINI, MYOGLOBIN in the last 168 hours.  Invalid input(s): CK ------------------------------------------------------------------------------------------------------------------    Component Value Date/Time   BNP 182.2 (H) 03/15/2021 0745    Micro Results Recent Results (from the past 240 hour(s))  SARS CORONAVIRUS 2 (TAT 6-24 HRS) Nasopharyngeal Nasopharyngeal Swab     Status: Abnormal   Collection Time: 03/13/21 11:43 AM   Specimen: Nasopharyngeal Swab  Result Value Ref Range Status   SARS Coronavirus 2 POSITIVE (A) NEGATIVE Final    Comment: (NOTE) SARS-CoV-2 target nucleic acids are DETECTED.  The SARS-CoV-2 RNA is generally detectable in upper and lower respiratory specimens during the acute phase of infection. Positive results are indicative of the presence of SARS-CoV-2 RNA. Clinical correlation with patient history and other diagnostic information is  necessary to determine patient infection status. Positive results do not rule out bacterial infection or co-infection with other  viruses.  The expected result is Negative.  Fact Sheet for Patients: SugarRoll.be  Fact Sheet for Healthcare Providers: https://www.woods-mathews.com/  This test is not yet approved or cleared by the [place] FDA and  has  been authorized for detection and/or diagnosis of SARS-CoV-2 by FDA under an Emergency Use Authorization (EUA). This EUA will remain  in effect (meaning this test can be used) for the duration of the COVID-19 declaration under Section 564(b)(1) of the Act, 21 U. S.C. section 360bbb-3(b)(1), unless the authorization is terminated or revoked sooner.   Performed at [facility], [place] [address] [place], [place] [address]     Radiology Reports DG Chest Portable 1 View  Result Date: 03/13/2021 CLINICAL DATA:  Cough. EXAM: PORTABLE CHEST 1 VIEW COMPARISON:  March 22, 2020. FINDINGS: Stable cardiomediastinal silhouette. Both lungs are clear. The visualized skeletal structures are unremarkable. IMPRESSION: No active disease. Electronically Signed   By: Marijo Conception M.D.   On: 03/13/2021 13:18      Time Spent in minutes  30   Lala Lund M.D on 03/15/2021 at 10:47 AM  To page go to [email or web address]

## 2021-03-15 NOTE — Progress Notes (Signed)
Inpatient Diabetes Program Recommendations  AACE/ADA: New Consensus Statement on Inpatient Glycemic Control (2015)  Target Ranges:  Prepandial:   less than 140 mg/dL      Peak postprandial:   less than 180 mg/dL (1-2 hours)      Critically ill patients:  140 - 180 mg/dL   Lab Results  Component Value Date   GLUCAP 366 (H) 03/15/2021   HGBA1C 16.7 (H) 03/23/2020    Review of Glycemic Control  Diabetes history: DM 2 Outpatient Diabetes medications: Fiasp 2-6 units tid, Tresiba 14 units, Januvia 100 mg Daily Current orders for Inpatient glycemic control:  Lantus 20 units Daily Novolog 0-15 units tid + hs Novolog 4 units tid meal coverage Tradjenta 5 mg Daily  Inpatient Diabetes Program Recommendations:    NOTE A1c 16.7% is from last year 03/2020 current A1c taking 2-3 days to process.  DM Coordinator will speak with pt prior to d/c relating to clarity on insulin usage and glucose control. DM Coordinator working remotely over the weekend and is not on campus, but is available by phone.  Insulin adjustments per Dr. Candiss Norse on rounds today.  Thanks,  Tama Headings RN, MSN, BC-ADM Inpatient Diabetes Coordinator Team Pager [phone] (8a-5p)

## 2021-03-15 NOTE — Evaluation (Signed)
Physical Therapy Evaluation Patient Details Name: [name] MRN: [id number] DOB: 06-06-1935 Today's Date: 03/15/2021      Clinical Impression  Pt demonstrates ability to perform bed mobility and transfers without the need for physical assistance. Pt tolerates ambulation of household distances, without an AD and experiences one LOB, requiring physical assistance to correct. Pt tolerates a second bout of ambulation, with HHA and experiences an additional LOB with physical assistance to correct. Pt adamant about not needing to use a RW for safety, despite PT encouragement. Pt demonstrates deficits in overall strength, power, and safety awareness and will benefit from acute PT to improve safety and independent mobility.     03/15/21 0941  PT Visit Information  Last PT Received On 03/15/21  Assistance Needed +1  History of Present Illness Pt is an 86 y/o female presenting to ED on 03/13/2021 with 2 days of malaise, upper respite tract infection, fatigue and generalized weakness. Pt found to be positive for COVID-19 and in diabetic ketoacidosis. CXR showed no acute findings. PMH significant of diabetes, insulin-dependent, hyperlipidemia, hypertension, chronic A. fib on Eliquis, hypothyroidism.  Precautions  Precautions Fall  Restrictions  Weight Bearing Restrictions No  Home Living  Family/patient expects to be discharged to: Private residence  Living Arrangements Alone  Available Help at Discharge Family;Available PRN/intermittently (daughter daily)  Type of [place] to enter  Entrance Stairs-Number of Steps 1 (1 in front, no steps in back)  Entrance Stairs-Rails None  Home Layout Two level;Full bath on main level (bedroom on main floor. Full basement and main floor.)  Alternate Level Stairs-Number of Steps flight  Alternate Level Stairs-Rails Right (going up)  Bathroom Shower/Tub Tub/shower unit  [place] - 2  wheels;Cane - single point;BSC  Prior Function  Level of Independence Independent  Comments Pt drives and works. Pt enjoys tending to her flower garden.  Communication  Communication No difficulties  Pain Assessment  Pain Assessment No/denies pain  Cognition  Arousal/Alertness Awake/alert  Behavior During Therapy WFL for tasks assessed/performed  Overall Cognitive Status Within Functional Limits for tasks assessed  General Comments Pt safety awareness questionable, pt declines ambulating with a walker but experiences 2 LOB. Pt reports her balance will improve as she is now able to get out of the bed and move around more.  Upper Extremity Assessment  Upper Extremity Assessment Defer to OT evaluation  Lower Extremity Assessment  Lower Extremity Assessment Generalized weakness  Cervical / Trunk Assessment  Cervical / Trunk Assessment Normal  Bed Mobility  Overal bed mobility Needs Assistance  Bed Mobility Supine to Sit  Supine to sit Supervision;HOB elevated  General bed mobility comments supervision for safety.  Transfers  Overall transfer level Needs assistance  Equipment used None  Transfers Sit to/from Stand (2 x)  Sit to Stand Supervision  Ambulation/Gait  Ambulation/Gait assistance Min guard  Gait Distance (Feet) 100 Feet  Assistive device None;1 person hand held assist  Gait Pattern/deviations Step-through pattern;Decreased stride length  General Gait Details Pt ambulates without AD initally, experiences 1 LOB with min A to correct. 1 seated rest break. Pt continues ambulation with HHA of one and experiences an additional LOB with min A to correct. Pt declines using RW, reporting her balance will improve as she is able to be on her feet and OOB now.  Gait velocity decreased  Gait velocity interpretation 1.31 - 2.62 ft/sec, indicative of limited community ambulator  Balance  Overall balance assessment Needs  assistance  Sitting-balance support Feet supported  Sitting  balance-Leahy Scale Good  Standing balance support During functional activity  Standing balance-Leahy Scale Poor  Standing balance comment Pt experiences 2 LOB with min A to correct.  General Comments  General comments (skin integrity, edema, etc.) VSS on RA.  PT - End of Session  Activity Tolerance Patient tolerated treatment well  Patient left in chair;with call bell/phone within reach  Nurse Communication Mobility status  PT Assessment  PT Recommendation/Assessment Patient needs continued PT services  PT Visit Diagnosis Unsteadiness on feet (R26.81);Muscle weakness (generalized) (M62.81);Other abnormalities of gait and mobility (R26.89)  PT Problem List Decreased strength;Decreased balance;Decreased mobility;Decreased safety awareness  PT Plan  PT Frequency (ACUTE ONLY) Min 3X/week  PT Treatment/Interventions (ACUTE ONLY) Gait training;DME instruction;Functional mobility training;Therapeutic activities;Therapeutic exercise;Balance training;Patient/family education  AM-PAC PT "6 Clicks" Mobility Outcome Measure (Version 2)  Help needed turning from your back to your side while in a flat bed without using bedrails? 3  Help needed moving from lying on your back to sitting on the side of a flat bed without using bedrails? 3  Help needed moving to and from a bed to a chair (including a wheelchair)? 3  Help needed standing up from a chair using your arms (e.g., wheelchair or bedside chair)? 3  Help needed to walk in hospital room? 3  Help needed climbing 3-5 steps with a railing?  3  6 Click Score 18  Consider Recommendation of Discharge To: [facility] with [facility]  PT Recommendation  Follow Up Recommendations No PT follow up  PT equipment None recommended by PT  Acute Rehab PT Goals  Patient Stated Goal Get better and go home  PT Goal Formulation With patient  Time For Goal Achievement 03/29/21  Potential to Achieve Goals Good  PT Time Calculation  PT Start Time (ACUTE ONLY) 0935  PT Stop Time  (ACUTE ONLY) 1012  PT Time Calculation (min) (ACUTE ONLY) 37 min  PT General Charges  $$ ACUTE PT VISIT 1 Visit  PT Evaluation  $PT Eval Low Complexity 1 Low  PT Treatments  $Therapeutic Activity 8-22 mins        Acute Rehab  Pager: [phone]   Garwin Brothers, SPT  03/15/2021, 1:41 PM

## 2021-03-16 LAB — CBC WITH DIFFERENTIAL/PLATELET
Abs Immature Granulocytes: 0.01 10*3/uL (ref 0.00–0.07)
Basophils Absolute: 0 10*3/uL (ref 0.0–0.1)
Basophils Relative: 1 %
Eosinophils Absolute: 0.1 10*3/uL (ref 0.0–0.5)
Eosinophils Relative: 2 %
HCT: 34.9 % — ABNORMAL LOW (ref 36.0–46.0)
Hemoglobin: 11.3 g/dL — ABNORMAL LOW (ref 12.0–15.0)
Immature Granulocytes: 0 %
Lymphocytes Relative: 37 %
Lymphs Abs: 1.3 10*3/uL (ref 0.7–4.0)
MCH: 27.4 pg (ref 26.0–34.0)
MCHC: 32.4 g/dL (ref 30.0–36.0)
MCV: 84.5 fL (ref 80.0–100.0)
Monocytes Absolute: 0.5 10*3/uL (ref 0.1–1.0)
Monocytes Relative: 14 %
Neutro Abs: 1.6 10*3/uL — ABNORMAL LOW (ref 1.7–7.7)
Neutrophils Relative %: 46 %
Platelets: 125 10*3/uL — ABNORMAL LOW (ref 150–400)
RBC: 4.13 MIL/uL (ref 3.87–5.11)
RDW: 13.7 % (ref 11.5–15.5)
WBC: 3.5 10*3/uL — ABNORMAL LOW (ref 4.0–10.5)
nRBC: 0 % (ref 0.0–0.2)

## 2021-03-16 LAB — MAGNESIUM: Magnesium: 1.6 mg/dL — ABNORMAL LOW (ref 1.7–2.4)

## 2021-03-16 LAB — GLUCOSE, CAPILLARY
Glucose-Capillary: 219 mg/dL — ABNORMAL HIGH (ref 70–99)
Glucose-Capillary: 254 mg/dL — ABNORMAL HIGH (ref 70–99)

## 2021-03-16 LAB — COMPREHENSIVE METABOLIC PANEL
ALT: 14 U/L (ref 0–44)
AST: 19 U/L (ref 15–41)
Albumin: 2.6 g/dL — ABNORMAL LOW (ref 3.5–5.0)
Alkaline Phosphatase: 54 U/L (ref 38–126)
Anion gap: 8 (ref 5–15)
BUN: 14 mg/dL (ref 8–23)
CO2: 23 mmol/L (ref 22–32)
Calcium: 8.1 mg/dL — ABNORMAL LOW (ref 8.9–10.3)
Chloride: 102 mmol/L (ref 98–111)
Creatinine, Ser: 0.96 mg/dL (ref 0.44–1.00)
GFR, Estimated: 59 mL/min — ABNORMAL LOW (ref >60)
Glucose, Bld: 262 mg/dL — ABNORMAL HIGH (ref 70–99)
Potassium: 3.3 mmol/L — ABNORMAL LOW (ref 3.5–5.1)
Sodium: 133 mmol/L — ABNORMAL LOW (ref 135–145)
Total Bilirubin: 0.3 mg/dL (ref 0.3–1.2)
Total Protein: 5.5 g/dL — ABNORMAL LOW (ref 6.5–8.1)

## 2021-03-16 LAB — BRAIN NATRIURETIC PEPTIDE: B Natriuretic Peptide: 88.5 pg/mL (ref 0.0–100.0)

## 2021-03-16 MED ORDER — MAGNESIUM SULFATE 2 GM/50ML IV SOLN
2.0000 g | Freq: Once | INTRAVENOUS | Status: AC
  Administered 2021-03-16: 2 g via INTRAVENOUS
  Filled 2021-03-16: qty 50

## 2021-03-16 MED ORDER — NOVOLOG FLEXPEN 100 UNIT/ML ~~LOC~~ SOPN: 4.0000 [IU] | PEN_INJECTOR | SUBCUTANEOUS | 0 refills | Status: DC

## 2021-03-16 MED ORDER — POTASSIUM CHLORIDE CRYS ER 20 MEQ PO TBCR
40.0000 meq | EXTENDED_RELEASE_TABLET | Freq: Once | ORAL | Status: AC
  Administered 2021-03-16: 40 meq via ORAL
  Filled 2021-03-16: qty 2

## 2021-03-16 MED ORDER — TOUJEO MAX SOLOSTAR 300 UNIT/ML ~~LOC~~ SOPN: 25.0000 [IU] | PEN_INJECTOR | Freq: Every day | SUBCUTANEOUS | 0 refills | Status: DC

## 2021-03-16 MED ORDER — INSULIN GLARGINE 100 UNIT/ML ~~LOC~~ SOLN
25.0000 [IU] | Freq: Every day | SUBCUTANEOUS | Status: DC
  Administered 2021-03-16: 25 [IU] via SUBCUTANEOUS
  Filled 2021-03-16: qty 0.25

## 2021-03-16 NOTE — TOC Transition Note (Signed)
Transition of Care [facility]) - CM/SW Discharge Note   Patient Details  Name: [name] MRN: [id number] Date of Birth: 03-06-1935  Transition of Care [facility]) CM/SW Contact:  Zenon Mayo, RN Phone Number: 03/16/2021, 11:03 AM   Clinical Narrative:     NCM spoke with patient , offered choice for [facility] services she states she would like [facility] , but if they are unable to take referral she does not have a preference.  NCM made referral to [facility] with [facility] for [place], [place].  Awaiting to hear back.  Patient states she has walker/cane aand bsc at home. She drives her self to doctors appts.  Her daughter lives here in [place] behind [employer]. [place] with [facility] states she can not take referral. NCM made referral to [facility] with [place], for [place], she states she can take referral.  Soc will begin 24 to 48 hrs post dc.      Final next level of care: [place] Barriers to Discharge: No Barriers Identified   Patient Goals and CMS Choice Patient states their goals for this hospitalization and ongoing recovery are:: return home CMS Medicare.gov Compare Post Acute Care list provided to:: Patient Choice offered to / list presented to : Patient  Discharge Placement                       Discharge Plan and Services                  DME Agency: NA       HH Arranged: RN,PT [place] Agency: [place] Date [place]: 03/16/21 Time [place]: [id number] Representative spoke with at [place]: [place] Interventions     Readmission Risk Interventions No flowsheet data found.

## 2021-03-16 NOTE — Discharge Summary (Signed)
[name] BPZ:[id number] DOB: 1935-05-01 DOA: 03/13/2021  PCP: Deland Pretty, MD  Admit date: 03/13/2021  Discharge date: 03/16/2021  Admitted From: Home   Disposition:  Home   Recommendations for Outpatient Follow-up:   Follow up with PCP in 1-2 weeks  PCP Please obtain BMP/CBC, 2 view CXR in 1week,  (see Discharge instructions)   PCP Please follow up on the following pending results:    Home Health: PT, RN   Equipment/Devices: None  Consultations: None  Discharge Condition: Stable    CODE STATUS: Full    Diet Recommendation: Heart Healthy Low Carb  Diet Order            Diet heart healthy/carb modified Room service appropriate? Yes; Fluid consistency: Thin  Diet effective now                  Chief Complaint  Patient presents with  . Diabetic Ketoacidosis     Brief history of present illness from the day of admission and additional interim summary    86 y.o.femalewith medical history significant ofdiabetes, insulin-dependent, hyperlipidemia, hypertension, chronic A. fib on Eliquis, hypothyroidism who presents with 2 days of malaise, upper respite tract infection, fatigue and generalized weakness, she is diagnosed with DKA and incidental COVID-19 infection admitted to the hospital.                                                                 Hospital Course   1.DKA in DM2 -poor outpatient glycemic control due to hyperglycemia, A1c was extremely elevated > 16.  DM education provided.  DKA has resolved, gap is closed - stable on Lantus + ISS, have streamlined her regimen, PCP to monitor glycemic control, HHRN also requested.  Lab Results  Component Value Date   HGBA1C 16.7 (H) 03/23/2020      2. Incidental Covid infection - no symptoms, Vaccinated.  3.  Paroxysmal A. fib-Chad vas 2 score  of greater than 3.  On beta-blocker and Eliquis continue.  4.  Dyslipidemia.  Continue home dose Lipitor.  5.  Hypothyroidism.  On Synthroid.  6.  Weakness.  PT OT.  [place] on going home. HHPT,RN.  7.  Hypokalemia and hypomagnesemia.  Replaced.  8. HTN - stable on B.Blocker only.  9. AKI - resolved after IVF.  Discharge diagnosis     Principal Problem:   DKA (diabetic ketoacidosis) ([place]) Active Problems:   Essential hypertension   Type 2 diabetes mellitus ([place])   Hypothyroidism   Atrial fibrillation with RVR ([place])   Hypercholesterolemia   COVID-19 virus infection    Discharge instructions    Discharge Instructions    Discharge instructions   Complete by: As directed    Follow with Primary MD Deland Pretty, MD in 7 days   Get CBC, CMP, Magbesium,  2 view Chest X ray -  checked next visit within 1 week by Primary MD   Activity: As tolerated with Full fall precautions use walker/cane & assistance as needed  Disposition Home    Diet: Heart Healthy - Low Carb  Accuchecks 4 times/day, Once in AM empty stomach and then before each meal. Log in all results and show them to your Prim.MD in 3 days. If any glucose reading is under 80 or above 300 call your Prim MD immidiately. Follow Low glucose instructions for glucose under 80 as instructed.   Special Instructions: If you have smoked or chewed Tobacco  in the last 2 yrs please stop smoking, stop any regular Alcohol  and or any Recreational drug use.  On your next visit with your primary care physician please Get Medicines reviewed and adjusted.  Please request your Prim.MD to go over all Hospital Tests and Procedure/Radiological results at the follow up, please get all Hospital records sent to your Prim MD by signing hospital release before you go home.  If you experience worsening of your admission symptoms, develop shortness of breath, life threatening emergency, suicidal or homicidal thoughts you must seek  medical attention immediately by calling 911 or calling your MD immediately  if symptoms less severe.  You Must read complete instructions/literature along with all the possible adverse reactions/side effects for all the Medicines you take and that have been prescribed to you. Take any new Medicines after you have completely understood and accpet all the possible adverse reactions/side effects.   Increase activity slowly   Complete by: As directed    MyChart COVID-19 home monitoring program   Complete by: Mar 16, 2021    Is the patient willing to use the [place] for home monitoring?: Yes   Temperature monitoring   Complete by: Mar 16, 2021    After how many days would you like to receive a notification of this patient's flowsheet entries?: 1      Discharge Medications   Allergies as of 03/16/2021   No Known Allergies     Medication List    STOP taking these medications   Fiasp FlexTouch 100 UNIT/ML FlexTouch Pen Generic drug: insulin aspart   furosemide 20 MG tablet Commonly known as: LASIX   potassium chloride SA 20 MEQ tablet Commonly known as: Klor-Con M20   sitaGLIPtin 100 MG tablet Commonly known as: Vladimir Crofts FlexTouch 100 UNIT/ML FlexTouch Pen Generic drug: insulin degludec   triamterene-hydrochlorothiazide 37.5-25 MG tablet Commonly known as: MAXZIDE-25     TAKE these medications   apixaban 2.5 MG Tabs tablet Commonly known as: Eliquis Take 1 tablet (2.5 mg total) by mouth 2 (two) times daily.   atorvastatin 20 MG tablet Commonly known as: LIPITOR Take 20 mg by mouth daily.   levothyroxine 75 MCG tablet Commonly known as: SYNTHROID Take 75 mcg by mouth daily before breakfast. What changed: Another medication with the same name was removed. Continue taking this medication, and follow the directions you see here.   metoprolol succinate 50 MG 24 hr tablet Commonly known as: TOPROL-XL TAKE 1 TABLET BY MOUTH TWICE A DAY   NovoLOG FlexPen  100 UNIT/ML FlexPen Generic drug: insulin aspart Inject 4-12 Units into the skin See admin instructions. Before each meal 3 times a day, 140-199 - 4 units, 200-250 - 6 units, 251-299 - 8 units,  300-349 - 12 units,  350 or above 15 units. What changed: additional instructions   Tiadylt ER 180  MG 24 hr capsule Generic drug: diltiazem TAKE 1 CAPSULE BY MOUTH EVERY DAY What changed:   how much to take  when to take this   Toujeo Max SoloStar 300 UNIT/ML Solostar Pen Generic drug: insulin glargine (2 Unit Dial) Inject 26 Units into the skin daily. What changed:   medication strength  how much to take  when to take this        Follow-up Information    Deland Pretty, MD. Schedule an appointment as soon as possible for a visit in 1 week(s).   Specialty: Internal Medicine Contact information: [address] [place] [address] [phone]        Pixie Casino, MD. Schedule an appointment as soon as possible for a visit in 1 week(s).   Specialty: Cardiology Contact information: [address] [place] [address] [phone]               Major procedures and Radiology Reports - PLEASE review detailed and final reports thoroughly  -       DG Chest Portable 1 View  Result Date: 03/13/2021 CLINICAL DATA:  Cough. EXAM: PORTABLE CHEST 1 VIEW COMPARISON:  March 22, 2020. FINDINGS: Stable cardiomediastinal silhouette. Both lungs are clear. The visualized skeletal structures are unremarkable. IMPRESSION: No active disease. Electronically Signed   By: Marijo Conception M.D.   On: 03/13/2021 13:18   MM 3D SCREEN BREAST BILATERAL  Result Date: 03/14/2021 CLINICAL DATA:  Screening. EXAM: DIGITAL SCREENING BILATERAL MAMMOGRAM WITH TOMOSYNTHESIS AND CAD TECHNIQUE: Bilateral screening digital craniocaudal and mediolateral oblique mammograms were obtained. Bilateral screening digital breast tomosynthesis was performed. The images were evaluated  with computer-aided detection. COMPARISON:  Previous exam(s). ACR Breast Density Category b: There are scattered areas of fibroglandular density. FINDINGS: There are no findings suspicious for malignancy. The images were evaluated with computer-aided detection. IMPRESSION: No mammographic evidence of malignancy. A result letter of this screening mammogram will be mailed directly to the patient. RECOMMENDATION: Screening mammogram in one year. (Code:[id number]-B-01Y) BI-RADS CATEGORY  1: Negative. Electronically Signed   By: Everlean Alstrom M.D.   On: 03/14/2021 13:33    Micro Results     Recent Results (from the past 240 hour(s))  SARS CORONAVIRUS 2 (TAT 6-24 HRS) Nasopharyngeal Nasopharyngeal Swab     Status: Abnormal   Collection Time: 03/13/21 11:43 AM   Specimen: Nasopharyngeal Swab  Result Value Ref Range Status   SARS Coronavirus 2 POSITIVE (A) NEGATIVE Final    Comment: (NOTE) SARS-CoV-2 target nucleic acids are DETECTED.  The SARS-CoV-2 RNA is generally detectable in upper and lower respiratory specimens during the acute phase of infection. Positive results are indicative of the presence of SARS-CoV-2 RNA. Clinical correlation with patient history and other diagnostic information is  necessary to determine patient infection status. Positive results do not rule out bacterial infection or co-infection with other viruses.  The expected result is Negative.  Fact Sheet for Patients: SugarRoll.be  Fact Sheet for Healthcare Providers: https://www.woods-mathews.com/  This test is not yet approved or cleared by the [place] FDA and  has been authorized for detection and/or diagnosis of SARS-CoV-2 by FDA under an Emergency Use Authorization (EUA). This EUA will remain  in effect (meaning this test can be used) for the duration of the COVID-19 declaration under Section 564(b)(1) of the Act, 21 U. S.C. section 360bbb-3(b)(1), unless the  authorization is terminated or revoked sooner.   Performed at [facility], [place] [address]  [place], [place] [address]     Today   Subjective    Arielys Marzette today has no headache,no chest abdominal pain,no new weakness tingling or numbness, feels much better wants to go home today.     Objective   Blood pressure (!) 114/50, pulse 63, temperature 98 F (36.7 C), temperature source Oral, resp. rate 16, height 5\' 1"  (1.549 m), weight 62.4 kg, SpO2 96 %.   Intake/Output Summary (Last 24 hours) at 03/16/2021 0845 Last data filed at 03/15/2021 2111 Gross per 24 hour  Intake 890 ml  Output --  Net 890 ml    Exam  Awake Alert, No new F.N deficits, Normal affect [place].AT,PERRAL Supple Neck,No JVD, No cervical lymphadenopathy appriciated.  Symmetrical Chest wall movement, Good air movement bilaterally, CTAB RRR,No Gallops,Rubs or new Murmurs, No Parasternal Heave +ve B.Sounds, Abd Soft, Non tender, No organomegaly appriciated, No rebound -guarding or rigidity. No Cyanosis, Clubbing or edema, No new Rash or bruise   Data Review   Recent Labs  Lab 03/13/21 1431 03/13/21 1611 03/13/21 2209 03/15/21 0745 03/16/21 0039  WBC 5.1  --   --  4.4 3.5*  HGB 11.4* 12.2 11.9* 12.0 11.3*  HCT 37.0 36.0 35.0* 36.4 34.9*  PLT 128*  --   --  134* 125*  MCV 89.8  --   --  84.7 84.5  MCH 27.7  --   --  27.9 27.4  MCHC 30.8  --   --  33.0 32.4  RDW 14.1  --   --  13.9 13.7  LYMPHSABS 1.0  --   --   --  1.3  MONOABS 0.6  --   --   --  0.5  EOSABS 0.1  --   --   --  0.1  BASOSABS 0.0  --   --   --  0.0    Recent Labs  Lab 03/13/21 1431 03/13/21 1611 03/13/21 2357 03/14/21 0922 03/14/21 1226 03/15/21 0745 03/16/21 0039  NA 132*   < > 130* 135 134* 131* 133*  K 4.9   < > 4.2 3.4* 3.5 3.3* 3.3*  CL 99   < > 103 106 103 101 102  CO2 15*   < > 15* 24 21* 21* 23  GLUCOSE 384*   < > 308* 155* 318* 443* 262*  BUN 23   < > 20 15 16 10 14   CREATININE 1.34*   < > 1.05* 0.75 0.81  0.95 0.96  CALCIUM 8.7*   < > 8.2* 8.4* 8.2* 8.1* 8.1*  AST 24  --   --   --   --   --  19  ALT 18  --   --   --   --   --  14  ALKPHOS 68  --   --   --   --   --  54  BILITOT 1.2  --   --   --   --   --  0.3  ALBUMIN 3.4*  --   --   --   --   --  2.6*  MG  --   --   --   --   --  1.5* 1.6*  BNP  --   --   --   --   --  182.2* 88.5   < > = values in this interval not displayed.      Lab Results  Component Value Date   HGBA1C 16.7 (H) 03/23/2020  Total Time in preparing paper work, data evaluation and todays exam - 35 minutes  Lala Lund M.D on 03/16/2021 at 8:45 AM  [facility]

## 2021-03-16 NOTE — TOC Progression Note (Signed)
Transition of Care [facility]) - Progression Note    Patient Details  Name: [name] MRN: [id number] Date of Birth: 25-Dec-1934  [facility]) CM/SW Contact  Zenon Mayo, RN Phone Number: 03/16/2021, 9:24 AM  Clinical Narrative:    NCM spoke with patient , offered choice for [facility] services she states she would like [facility] , but if they are unable to take referral she does not have a preference.  NCM made referral to [facility] with [facility] for [place], [place].  Awaiting to hear back.  Patient states she has walker/cane aand bsc at home. She drives her self to doctors appts.  Her daughter lives here in [place] behind [employer].          Expected Discharge Plan and Services           Expected Discharge Date: 03/16/21                                     Social Determinants of Health (SDOH) Interventions    Readmission Risk Interventions No flowsheet data found.

## 2021-03-16 NOTE — Discharge Instructions (Signed)
Follow with Primary MD Deland Pretty, MD in 7 days   Get CBC, CMP, Magbesium, 2 view Chest X ray -  checked next visit within 1 week by Primary MD   Activity: As tolerated with Full fall precautions use walker/cane & assistance as needed  Disposition Home    Diet: Heart Healthy - Low Carb  Accuchecks 4 times/day, Once in AM empty stomach and then before each meal. Log in all results and show them to your Prim.MD in 3 days. If any glucose reading is under 80 or above 300 call your Prim MD immidiately. Follow Low glucose instructions for glucose under 80 as instructed.   Special Instructions: If you have smoked or chewed Tobacco  in the last 2 yrs please stop smoking, stop any regular Alcohol  and or any Recreational drug use.  On your next visit with your primary care physician please Get Medicines reviewed and adjusted.  Please request your Prim.MD to go over all Hospital Tests and Procedure/Radiological results at the follow up, please get all Hospital records sent to your Prim MD by signing hospital release before you go home.  If you experience worsening of your admission symptoms, develop shortness of breath, life threatening emergency, suicidal or homicidal thoughts you must seek medical attention immediately by calling 911 or calling your MD immediately  if symptoms less severe.  You Must read complete instructions/literature along with all the possible adverse reactions/side effects for all the Medicines you take and that have been prescribed to you. Take any new Medicines after you have completely understood and accpet all the possible adverse reactions/side effects.

## 2021-03-16 NOTE — Progress Notes (Signed)
Physical Therapy Treatment Patient Details Name: [name] MRN: [id number] DOB: September 06, 1935 Today's Date: 03/16/2021    History of Present Illness Pt is an 86 y/o female presenting to ED on 03/13/2021 with 2 days of malaise, upper respiratory tract infection, fatigue and generalized weakness. Pt found to be positive for COVID-19 and in diabetic ketoacidosis. CXR showed no acute findings. PMH significant of diabetes, insulin-dependent, hyperlipidemia, hypertension, chronic A. fib on Eliquis, hypothyroidism.    PT Comments    Pt received in chair, agreeable to therapy session and with good participation and tolerance for gait and stair training. Pt acknowledges she is below her functional baseline in terms of balance and activity tolerance and is now requesting HHPT. Pt requiring up to min guard (mostly Supervision) for stair ascent with B handrails and Supervision to modI for other functional mobility tasks. Pt reports moderate fatigue (4/10 modified RPE) at end of session. Pt continues to benefit from PT services to progress toward functional mobility goals. Discharge recs updated per discussion with pt and supervising PT Dawn M.  Follow Up Recommendations  Home health PT (max HH services, per pt she will not have family assist as they are also covid+)     Equipment Recommendations  None recommended by PT    Recommendations for Other Services       Precautions / Restrictions Precautions Precautions: Fall Restrictions Weight Bearing Restrictions: No    Mobility  Bed Mobility Overal bed mobility: Independent             General bed mobility comments: pt received in chair and remained EOB with RN present at end of session    Transfers Overall transfer level: Modified independent Equipment used: None Transfers: Sit to/from Stand Sit to Stand: Modified independent (Device/Increase time)         General transfer comment: from recliner and EOB, no LOB and used arm rest for  support  Ambulation/Gait Ambulation/Gait assistance: Supervision Gait Distance (Feet): 90 Feet (32ft +90 ft) Assistive device: None Gait Pattern/deviations: Step-through pattern;Decreased stride length Gait velocity: decreased Gait velocity interpretation: 1.31 - 2.62 ft/sec, indicative of limited community ambulator General Gait Details: no LOB, fairly steady, no DOE   Stairs Stairs: Yes Stairs assistance: Supervision Stair Management: Two rails;Forwards;Step to pattern Number of Stairs: 10 General stair comments: pt instructed on safe sequencing, pt performed in room with step stool and RW to simulate home setup (unable to attempt in hallway due to covid+ precs), no LOB or buckling observed, increased time to perform; pt reports no STE home at one entrance but has flight for some areas of home       Balance Overall balance assessment: Needs assistance Sitting-balance support: Feet supported Sitting balance-Leahy Scale: Good     Standing balance support: During functional activity Standing balance-Leahy Scale: Fair Standing balance comment: no LOB with supervision/no AD, encouraged pt to utilize cane at home for support as her daughter will not be able to assist her          Cognition Arousal/Alertness: Awake/alert Behavior During Therapy: WFL for tasks assessed/performed Overall Cognitive Status: Within Functional Limits for tasks assessed       General Comments: Pt safety awareness questionable but improving, no overt LOB this session and seems more aware of deficits now that she is leaving, pt asking about hiring personal care attendant Case Manager notified, per case mgr pt would have to pay out of pocket for these services and set up on her own.  Exercises Other Exercises Other Exercises: handout given for supine HEP, encouraged her to perform TID    General Comments General comments (skin integrity, edema, etc.): BP 144/65 (88) post-exertion, HR 53-62 bpm  resting and up to 70-80's bpm with exertion, no dizziness with transfers      Pertinent Vitals/Pain Pain Assessment: No/denies pain    Home Living Family/patient expects to be discharged to:: Private residence Living Arrangements: Alone Available Help at Discharge: Family;Available PRN/intermittently (daughter checks on her daily) Type of Home: House Home Access: Stairs to enter Entrance Stairs-Rails: None Home Layout: Two level;Full bath on main level (bedroom on main, has a basement (laundry)) Home Equipment: Walker - 2 wheels;Cane - single point;Bedside commode      Prior Function Level of Independence: Independent      Comments: 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 Pt enjoys tending to her flower garden.   PT Goals (current goals can now be found in the care plan section) Acute Rehab PT Goals Patient Stated Goal: Get better and go home PT Goal Formulation: With patient Time For Goal Achievement: 03/29/21 Potential to Achieve Goals: Good Progress towards PT goals: Progressing toward goals    Frequency    Min 3X/week      PT Plan Current plan remains appropriate       AM-PAC PT "6 Clicks" Mobility   Outcome Measure  Help needed turning from your back to your side while in a flat bed without using bedrails?: None Help needed moving from lying on your back to sitting on the side of a flat bed without using bedrails?: A Little Help needed moving to and from a bed to a chair (including a wheelchair)?: A Little Help needed standing up from a chair using your arms (e.g., wheelchair or bedside chair)?: None Help needed to walk in hospital room?: A Little Help needed climbing 3-5 steps with a railing? : A Little 6 Click Score: 20    End of Session   Activity Tolerance: Patient tolerated treatment well Patient left: with call bell/phone within reach;in bed;with nursing/sitter in room (seated EOB, RN present to assist pt) Nurse Communication: Mobility status;Other  (comment) (case mgr/RN notified pt requesting personal care attendant info) PT Visit Diagnosis: Unsteadiness on feet (R26.81);Muscle weakness (generalized) (M62.81);Other abnormalities of gait and mobility (R26.89)     Time: 7353-2992 PT Time Calculation (min) (ACUTE ONLY): 20 min  Charges:  $Gait Training: 8-22 mins                     Shirel Mallis P., PTA Acute Rehabilitation Services Pager: [phone] Office: [place] 03/16/2021, 12:36 PM

## 2021-03-16 NOTE — Progress Notes (Signed)
RN went over discharge instructions with pt and removed PIV. Pt belongings with pt. NT to take pt to private vehicle. Pt transporting herself home.

## 2021-03-16 NOTE — Evaluation (Signed)
Occupational Therapy Evaluation and Discharge Patient Details Name: [name] MRN: [id number] DOB: September 29, 1935 Today's Date: 03/16/2021    History of Present Illness Pt is an 86 y/o female presenting to ED on 03/13/2021 with 2 days of malaise, upper respite tract infection, fatigue and generalized weakness. Pt found to be positive for COVID-19 and in diabetic ketoacidosis. CXR showed no acute findings. PMH significant of diabetes, insulin-dependent, hyperlipidemia, hypertension, chronic A. fib on Eliquis, hypothyroidism.   Clinical Impression   This 86 yo female admitted with above presents to acute OT at her baseline (independent), no further OT needs, we will sign off.    Follow Up Recommendations  No OT follow up    Equipment Recommendations  None recommended by OT       Precautions / Restrictions Precautions Precautions: None Restrictions Weight Bearing Restrictions: No      Mobility Bed Mobility Overal bed mobility: Independent                  Transfers Overall transfer level: Independent                    Balance Overall balance assessment: No apparent balance deficits (not formally assessed) (pt does report she is a little more unsteady on her feet than normal (usually not) but has gotten better each day.)                                         ADL either performed or assessed with clinical judgement   ADL Overall ADL's : At baseline                                             Vision Baseline Vision/History: Wears glasses Wears Glasses: Reading only              Pertinent Vitals/Pain Pain Assessment: No/denies pain     Hand Dominance Right   Extremity/Trunk Assessment Upper Extremity Assessment Upper Extremity Assessment: Overall WFL for tasks assessed           Communication Communication Communication: No difficulties   Cognition Arousal/Alertness: Awake/alert Behavior During Therapy:  WFL for tasks assessed/performed Overall Cognitive Status: Within Functional Limits for tasks assessed                                                Home Living Family/patient expects to be discharged to:: Private residence Living Arrangements: Alone Available Help at Discharge: Family;Available PRN/intermittently (daughter checks on her daily) Type of Home: House Home Access: Stairs to enter [employer] of Steps: 1 (in front, no back steps) Entrance Stairs-Rails: None Home Layout: Two level;Full bath on main level (bedroom on main, has a basement (laundry)) Alternate Level Stairs-Number of Steps: flight Alternate Level Stairs-Rails: Right (going up) Bathroom Shower/Tub: Tub/shower unit   Bathroom Toilet: Handicapped height     Home Equipment: Environmental consultant - 2 wheels;Cane - single point;Bedside commode          Prior Functioning/Environment Level of Independence: Independent        Comments: 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 Pt enjoys tending to her flower garden.  OT Goals(Current goals can be found in the care plan section) Acute Rehab OT Goals Patient Stated Goal: to go home today                AM-PAC OT "6 Clicks" Daily Activity     Outcome Measure Help from another person eating meals?: None Help from another person taking care of personal grooming?: None Help from another person toileting, which includes using toliet, bedpan, or urinal?: None Help from another person bathing (including washing, rinsing, drying)?: None Help from another person to put on and taking off regular upper body clothing?: None Help from another person to put on and taking off regular lower body clothing?: None 6 Click Score: 24   End of Session Nurse Communication:  (RN in room while pt was up and about with me)  Activity Tolerance: Patient tolerated treatment well Patient left: in chair;with call bell/phone within reach  OT Visit  Diagnosis: Unsteadiness on feet (R26.81)                Time: 9563-8756 OT Time Calculation (min): 26 min Charges:  OT General Charges $OT Visit: 1 Visit OT Evaluation $OT Eval Moderate Complexity: 1 Mod OT Treatments $Self Care/Home Management : 8-22 mins  [place]en Circle, OTR/L Acute Rehab Services Pager [phone] Office [phone]     Almon Register 03/16/2021, 8:49 AM

## 2021-03-18 DIAGNOSIS — U071 COVID-19: Secondary | ICD-10-CM | POA: Diagnosis not present

## 2021-03-18 DIAGNOSIS — I480 Paroxysmal atrial fibrillation: Secondary | ICD-10-CM | POA: Diagnosis not present

## 2021-03-18 DIAGNOSIS — I10 Essential (primary) hypertension: Secondary | ICD-10-CM | POA: Diagnosis not present

## 2021-03-18 DIAGNOSIS — E876 Hypokalemia: Secondary | ICD-10-CM | POA: Diagnosis not present

## 2021-03-18 DIAGNOSIS — N179 Acute kidney failure, unspecified: Secondary | ICD-10-CM | POA: Diagnosis not present

## 2021-03-18 DIAGNOSIS — E7800 Pure hypercholesterolemia, unspecified: Secondary | ICD-10-CM | POA: Diagnosis not present

## 2021-03-18 DIAGNOSIS — E1165 Type 2 diabetes mellitus with hyperglycemia: Secondary | ICD-10-CM | POA: Diagnosis not present

## 2021-03-18 DIAGNOSIS — E039 Hypothyroidism, unspecified: Secondary | ICD-10-CM | POA: Diagnosis not present

## 2021-03-19 LAB — HEMOGLOBIN A1C
Hgb A1c MFr Bld: 15.2 % — ABNORMAL HIGH (ref 4.8–5.6)
Mean Plasma Glucose: 389.54 mg/dL

## 2021-03-20 DIAGNOSIS — N179 Acute kidney failure, unspecified: Secondary | ICD-10-CM | POA: Diagnosis not present

## 2021-03-20 DIAGNOSIS — I10 Essential (primary) hypertension: Secondary | ICD-10-CM | POA: Diagnosis not present

## 2021-03-20 DIAGNOSIS — E039 Hypothyroidism, unspecified: Secondary | ICD-10-CM | POA: Diagnosis not present

## 2021-03-20 DIAGNOSIS — E1165 Type 2 diabetes mellitus with hyperglycemia: Secondary | ICD-10-CM | POA: Diagnosis not present

## 2021-03-20 DIAGNOSIS — E876 Hypokalemia: Secondary | ICD-10-CM | POA: Diagnosis not present

## 2021-03-20 DIAGNOSIS — E7800 Pure hypercholesterolemia, unspecified: Secondary | ICD-10-CM | POA: Diagnosis not present

## 2021-03-20 DIAGNOSIS — U071 COVID-19: Secondary | ICD-10-CM | POA: Diagnosis not present

## 2021-03-20 DIAGNOSIS — I480 Paroxysmal atrial fibrillation: Secondary | ICD-10-CM | POA: Diagnosis not present

## 2021-03-23 DIAGNOSIS — Z03818 Encounter for observation for suspected exposure to other biological agents ruled out: Secondary | ICD-10-CM | POA: Diagnosis not present

## 2021-03-29 DIAGNOSIS — Z03818 Encounter for observation for suspected exposure to other biological agents ruled out: Secondary | ICD-10-CM | POA: Diagnosis not present

## 2021-03-30 DIAGNOSIS — I10 Essential (primary) hypertension: Secondary | ICD-10-CM | POA: Diagnosis not present

## 2021-03-30 DIAGNOSIS — I700 Atherosclerosis of aorta: Secondary | ICD-10-CM | POA: Diagnosis not present

## 2021-03-30 DIAGNOSIS — E785 Hyperlipidemia, unspecified: Secondary | ICD-10-CM | POA: Diagnosis not present

## 2021-03-30 DIAGNOSIS — Z09 Encounter for follow-up examination after completed treatment for conditions other than malignant neoplasm: Secondary | ICD-10-CM | POA: Diagnosis not present

## 2021-03-30 DIAGNOSIS — E139 Other specified diabetes mellitus without complications: Secondary | ICD-10-CM | POA: Diagnosis not present

## 2021-04-01 DIAGNOSIS — I480 Paroxysmal atrial fibrillation: Secondary | ICD-10-CM | POA: Diagnosis not present

## 2021-04-01 DIAGNOSIS — E7800 Pure hypercholesterolemia, unspecified: Secondary | ICD-10-CM | POA: Diagnosis not present

## 2021-04-01 DIAGNOSIS — E876 Hypokalemia: Secondary | ICD-10-CM | POA: Diagnosis not present

## 2021-04-01 DIAGNOSIS — I10 Essential (primary) hypertension: Secondary | ICD-10-CM | POA: Diagnosis not present

## 2021-04-01 DIAGNOSIS — E1165 Type 2 diabetes mellitus with hyperglycemia: Secondary | ICD-10-CM | POA: Diagnosis not present

## 2021-04-01 DIAGNOSIS — E039 Hypothyroidism, unspecified: Secondary | ICD-10-CM | POA: Diagnosis not present

## 2021-04-01 DIAGNOSIS — U071 COVID-19: Secondary | ICD-10-CM | POA: Diagnosis not present

## 2021-04-01 DIAGNOSIS — N179 Acute kidney failure, unspecified: Secondary | ICD-10-CM | POA: Diagnosis not present

## 2021-04-02 DIAGNOSIS — N179 Acute kidney failure, unspecified: Secondary | ICD-10-CM | POA: Diagnosis not present

## 2021-04-02 DIAGNOSIS — I10 Essential (primary) hypertension: Secondary | ICD-10-CM | POA: Diagnosis not present

## 2021-04-02 DIAGNOSIS — U071 COVID-19: Secondary | ICD-10-CM | POA: Diagnosis not present

## 2021-04-02 DIAGNOSIS — Z8639 Personal history of other endocrine, nutritional and metabolic disease: Secondary | ICD-10-CM | POA: Diagnosis not present

## 2021-04-02 DIAGNOSIS — Z8616 Personal history of COVID-19: Secondary | ICD-10-CM | POA: Diagnosis not present

## 2021-04-02 DIAGNOSIS — E1165 Type 2 diabetes mellitus with hyperglycemia: Secondary | ICD-10-CM | POA: Diagnosis not present

## 2021-04-02 DIAGNOSIS — D649 Anemia, unspecified: Secondary | ICD-10-CM | POA: Diagnosis not present

## 2021-04-06 DIAGNOSIS — E139 Other specified diabetes mellitus without complications: Secondary | ICD-10-CM | POA: Diagnosis not present

## 2021-04-06 DIAGNOSIS — N189 Chronic kidney disease, unspecified: Secondary | ICD-10-CM | POA: Diagnosis not present

## 2021-04-06 DIAGNOSIS — E039 Hypothyroidism, unspecified: Secondary | ICD-10-CM | POA: Diagnosis not present

## 2021-04-06 DIAGNOSIS — E785 Hyperlipidemia, unspecified: Secondary | ICD-10-CM | POA: Diagnosis not present

## 2021-04-09 DIAGNOSIS — I129 Hypertensive chronic kidney disease with stage 1 through stage 4 chronic kidney disease, or unspecified chronic kidney disease: Secondary | ICD-10-CM | POA: Diagnosis not present

## 2021-04-09 DIAGNOSIS — N1831 Chronic kidney disease, stage 3a: Secondary | ICD-10-CM | POA: Diagnosis not present

## 2021-04-09 DIAGNOSIS — E1122 Type 2 diabetes mellitus with diabetic chronic kidney disease: Secondary | ICD-10-CM | POA: Diagnosis not present

## 2021-04-09 DIAGNOSIS — E039 Hypothyroidism, unspecified: Secondary | ICD-10-CM | POA: Diagnosis not present

## 2021-04-11 DIAGNOSIS — U071 COVID-19: Secondary | ICD-10-CM | POA: Diagnosis not present

## 2021-04-11 DIAGNOSIS — E1165 Type 2 diabetes mellitus with hyperglycemia: Secondary | ICD-10-CM | POA: Diagnosis not present

## 2021-04-11 DIAGNOSIS — E7800 Pure hypercholesterolemia, unspecified: Secondary | ICD-10-CM | POA: Diagnosis not present

## 2021-04-11 DIAGNOSIS — E039 Hypothyroidism, unspecified: Secondary | ICD-10-CM | POA: Diagnosis not present

## 2021-04-11 DIAGNOSIS — I10 Essential (primary) hypertension: Secondary | ICD-10-CM | POA: Diagnosis not present

## 2021-04-11 DIAGNOSIS — E876 Hypokalemia: Secondary | ICD-10-CM | POA: Diagnosis not present

## 2021-04-11 DIAGNOSIS — N179 Acute kidney failure, unspecified: Secondary | ICD-10-CM | POA: Diagnosis not present

## 2021-04-11 DIAGNOSIS — I480 Paroxysmal atrial fibrillation: Secondary | ICD-10-CM | POA: Diagnosis not present

## 2021-04-17 DIAGNOSIS — N179 Acute kidney failure, unspecified: Secondary | ICD-10-CM | POA: Diagnosis not present

## 2021-04-17 DIAGNOSIS — E039 Hypothyroidism, unspecified: Secondary | ICD-10-CM | POA: Diagnosis not present

## 2021-04-17 DIAGNOSIS — E876 Hypokalemia: Secondary | ICD-10-CM | POA: Diagnosis not present

## 2021-04-17 DIAGNOSIS — U071 COVID-19: Secondary | ICD-10-CM | POA: Diagnosis not present

## 2021-04-17 DIAGNOSIS — I480 Paroxysmal atrial fibrillation: Secondary | ICD-10-CM | POA: Diagnosis not present

## 2021-04-17 DIAGNOSIS — E7800 Pure hypercholesterolemia, unspecified: Secondary | ICD-10-CM | POA: Diagnosis not present

## 2021-04-17 DIAGNOSIS — E1165 Type 2 diabetes mellitus with hyperglycemia: Secondary | ICD-10-CM | POA: Diagnosis not present

## 2021-04-17 DIAGNOSIS — I10 Essential (primary) hypertension: Secondary | ICD-10-CM | POA: Diagnosis not present

## 2021-04-18 DIAGNOSIS — E1165 Type 2 diabetes mellitus with hyperglycemia: Secondary | ICD-10-CM | POA: Diagnosis not present

## 2021-04-18 DIAGNOSIS — E039 Hypothyroidism, unspecified: Secondary | ICD-10-CM | POA: Diagnosis not present

## 2021-04-18 DIAGNOSIS — U071 COVID-19: Secondary | ICD-10-CM | POA: Diagnosis not present

## 2021-04-18 DIAGNOSIS — E7800 Pure hypercholesterolemia, unspecified: Secondary | ICD-10-CM | POA: Diagnosis not present

## 2021-04-18 DIAGNOSIS — I480 Paroxysmal atrial fibrillation: Secondary | ICD-10-CM | POA: Diagnosis not present

## 2021-04-18 DIAGNOSIS — N179 Acute kidney failure, unspecified: Secondary | ICD-10-CM | POA: Diagnosis not present

## 2021-04-18 DIAGNOSIS — E876 Hypokalemia: Secondary | ICD-10-CM | POA: Diagnosis not present

## 2021-04-18 DIAGNOSIS — I10 Essential (primary) hypertension: Secondary | ICD-10-CM | POA: Diagnosis not present

## 2021-04-29 DIAGNOSIS — E876 Hypokalemia: Secondary | ICD-10-CM | POA: Diagnosis not present

## 2021-04-29 DIAGNOSIS — I10 Essential (primary) hypertension: Secondary | ICD-10-CM | POA: Diagnosis not present

## 2021-04-29 DIAGNOSIS — N179 Acute kidney failure, unspecified: Secondary | ICD-10-CM | POA: Diagnosis not present

## 2021-04-29 DIAGNOSIS — E1165 Type 2 diabetes mellitus with hyperglycemia: Secondary | ICD-10-CM | POA: Diagnosis not present

## 2021-04-29 DIAGNOSIS — I480 Paroxysmal atrial fibrillation: Secondary | ICD-10-CM | POA: Diagnosis not present

## 2021-04-29 DIAGNOSIS — U071 COVID-19: Secondary | ICD-10-CM | POA: Diagnosis not present

## 2021-04-29 DIAGNOSIS — E039 Hypothyroidism, unspecified: Secondary | ICD-10-CM | POA: Diagnosis not present

## 2021-04-29 DIAGNOSIS — E7800 Pure hypercholesterolemia, unspecified: Secondary | ICD-10-CM | POA: Diagnosis not present

## 2021-05-04 DIAGNOSIS — N189 Chronic kidney disease, unspecified: Secondary | ICD-10-CM | POA: Diagnosis not present

## 2021-05-04 DIAGNOSIS — E785 Hyperlipidemia, unspecified: Secondary | ICD-10-CM | POA: Diagnosis not present

## 2021-05-04 DIAGNOSIS — E139 Other specified diabetes mellitus without complications: Secondary | ICD-10-CM | POA: Diagnosis not present

## 2021-05-04 DIAGNOSIS — E039 Hypothyroidism, unspecified: Secondary | ICD-10-CM | POA: Diagnosis not present

## 2021-05-06 DIAGNOSIS — E876 Hypokalemia: Secondary | ICD-10-CM | POA: Diagnosis not present

## 2021-05-06 DIAGNOSIS — E7800 Pure hypercholesterolemia, unspecified: Secondary | ICD-10-CM | POA: Diagnosis not present

## 2021-05-06 DIAGNOSIS — E039 Hypothyroidism, unspecified: Secondary | ICD-10-CM | POA: Diagnosis not present

## 2021-05-06 DIAGNOSIS — I480 Paroxysmal atrial fibrillation: Secondary | ICD-10-CM | POA: Diagnosis not present

## 2021-05-06 DIAGNOSIS — U071 COVID-19: Secondary | ICD-10-CM | POA: Diagnosis not present

## 2021-05-06 DIAGNOSIS — E1165 Type 2 diabetes mellitus with hyperglycemia: Secondary | ICD-10-CM | POA: Diagnosis not present

## 2021-05-06 DIAGNOSIS — N179 Acute kidney failure, unspecified: Secondary | ICD-10-CM | POA: Diagnosis not present

## 2021-05-06 DIAGNOSIS — I10 Essential (primary) hypertension: Secondary | ICD-10-CM | POA: Diagnosis not present

## 2021-05-21 DIAGNOSIS — I10 Essential (primary) hypertension: Secondary | ICD-10-CM | POA: Diagnosis not present

## 2021-05-21 DIAGNOSIS — E1165 Type 2 diabetes mellitus with hyperglycemia: Secondary | ICD-10-CM | POA: Diagnosis not present

## 2021-05-21 DIAGNOSIS — I480 Paroxysmal atrial fibrillation: Secondary | ICD-10-CM | POA: Diagnosis not present

## 2021-05-21 DIAGNOSIS — U071 COVID-19: Secondary | ICD-10-CM | POA: Diagnosis not present

## 2021-05-28 DIAGNOSIS — E785 Hyperlipidemia, unspecified: Secondary | ICD-10-CM | POA: Diagnosis not present

## 2021-05-28 DIAGNOSIS — E139 Other specified diabetes mellitus without complications: Secondary | ICD-10-CM | POA: Diagnosis not present

## 2021-05-28 DIAGNOSIS — E039 Hypothyroidism, unspecified: Secondary | ICD-10-CM | POA: Diagnosis not present

## 2021-05-28 DIAGNOSIS — N189 Chronic kidney disease, unspecified: Secondary | ICD-10-CM | POA: Diagnosis not present

## 2021-06-10 DIAGNOSIS — I480 Paroxysmal atrial fibrillation: Secondary | ICD-10-CM | POA: Diagnosis not present

## 2021-06-10 DIAGNOSIS — E785 Hyperlipidemia, unspecified: Secondary | ICD-10-CM | POA: Diagnosis not present

## 2021-06-10 DIAGNOSIS — I10 Essential (primary) hypertension: Secondary | ICD-10-CM | POA: Diagnosis not present

## 2021-06-10 DIAGNOSIS — Z0289 Encounter for other administrative examinations: Secondary | ICD-10-CM | POA: Diagnosis not present

## 2021-06-22 DIAGNOSIS — N189 Chronic kidney disease, unspecified: Secondary | ICD-10-CM | POA: Diagnosis not present

## 2021-06-22 DIAGNOSIS — E039 Hypothyroidism, unspecified: Secondary | ICD-10-CM | POA: Diagnosis not present

## 2021-06-22 DIAGNOSIS — E139 Other specified diabetes mellitus without complications: Secondary | ICD-10-CM | POA: Diagnosis not present

## 2021-06-22 DIAGNOSIS — E785 Hyperlipidemia, unspecified: Secondary | ICD-10-CM | POA: Diagnosis not present

## 2021-07-02 DIAGNOSIS — Z23 Encounter for immunization: Secondary | ICD-10-CM | POA: Diagnosis not present

## 2021-07-16 DIAGNOSIS — I10 Essential (primary) hypertension: Secondary | ICD-10-CM | POA: Diagnosis not present

## 2021-07-16 DIAGNOSIS — R600 Localized edema: Secondary | ICD-10-CM | POA: Diagnosis not present

## 2021-07-16 DIAGNOSIS — E119 Type 2 diabetes mellitus without complications: Secondary | ICD-10-CM | POA: Diagnosis not present

## 2021-07-16 DIAGNOSIS — Z794 Long term (current) use of insulin: Secondary | ICD-10-CM | POA: Diagnosis not present

## 2021-07-16 DIAGNOSIS — I2510 Atherosclerotic heart disease of native coronary artery without angina pectoris: Secondary | ICD-10-CM | POA: Diagnosis not present

## 2021-07-16 DIAGNOSIS — E039 Hypothyroidism, unspecified: Secondary | ICD-10-CM | POA: Diagnosis not present

## 2021-07-16 DIAGNOSIS — I4891 Unspecified atrial fibrillation: Secondary | ICD-10-CM | POA: Diagnosis not present

## 2021-07-16 DIAGNOSIS — E785 Hyperlipidemia, unspecified: Secondary | ICD-10-CM | POA: Diagnosis not present

## 2021-07-16 DIAGNOSIS — D6869 Other thrombophilia: Secondary | ICD-10-CM | POA: Diagnosis not present

## 2021-08-06 DIAGNOSIS — L97512 Non-pressure chronic ulcer of other part of right foot with fat layer exposed: Secondary | ICD-10-CM | POA: Diagnosis not present

## 2021-08-06 DIAGNOSIS — M2042 Other hammer toe(s) (acquired), left foot: Secondary | ICD-10-CM | POA: Diagnosis not present

## 2021-08-06 DIAGNOSIS — M79672 Pain in left foot: Secondary | ICD-10-CM | POA: Diagnosis not present

## 2021-08-06 DIAGNOSIS — M79671 Pain in right foot: Secondary | ICD-10-CM | POA: Diagnosis not present

## 2021-08-06 DIAGNOSIS — M2041 Other hammer toe(s) (acquired), right foot: Secondary | ICD-10-CM | POA: Diagnosis not present

## 2021-08-10 ENCOUNTER — Other Ambulatory Visit: Payer: Self-pay

## 2021-08-10 ENCOUNTER — Ambulatory Visit (INDEPENDENT_AMBULATORY_CARE_PROVIDER_SITE_OTHER): Payer: Medicare PPO | Admitting: Family Medicine

## 2021-08-10 DIAGNOSIS — E039 Hypothyroidism, unspecified: Secondary | ICD-10-CM | POA: Diagnosis not present

## 2021-08-10 DIAGNOSIS — N1831 Chronic kidney disease, stage 3a: Secondary | ICD-10-CM | POA: Diagnosis not present

## 2021-08-10 DIAGNOSIS — E1165 Type 2 diabetes mellitus with hyperglycemia: Secondary | ICD-10-CM

## 2021-08-10 DIAGNOSIS — E1122 Type 2 diabetes mellitus with diabetic chronic kidney disease: Secondary | ICD-10-CM | POA: Diagnosis not present

## 2021-08-10 DIAGNOSIS — I129 Hypertensive chronic kidney disease with stage 1 through stage 4 chronic kidney disease, or unspecified chronic kidney disease: Secondary | ICD-10-CM | POA: Diagnosis not present

## 2021-08-10 NOTE — Progress Notes (Signed)
Medical Nutrition Therapy PCP Deland Pretty, MD Appt start time: 1400 end time: 1430 (30 minutes) Primary concerns today: Referred for medical nutrition therapy related to Blood glucose management, HTN, and HLD.  Also has h/o fatty liver, hypothyroidism, vit D deficiency, "other specified d/o of bone density," and a-fib.   Relevant history/background: [name] has been poorly managed recently.  She frequently has hypoglycemic events - has to get up to get something to eat around 3 or 4 AM 3-4 X wk.     Assessment:  [name] was more than 30 min late to today's appt b/c she could not find [facility], so appt was cut short.  Completed a very quick history, and provided basic diet recommendations for glycemic control with instructions to call with any questions.  She is scheduled for follow-up in 3 weeks.    Learning Readiness: Ready; has been trying to eat more non-fried foods, more veg's, less carb, but diet history reveals poor balance of macronutrients, which likely contributes to frequent out-of-range glucose levels.    Nololog: 4 u, 8 u, & 9 u for B, L, and D.   Glargine: 9 units at bedtime. Using CGM and insulin pump: Mike Craze shows during the past week: in target range 26% of time; above 68% of time, and below 6%; similar values for past 30 days.     Usual eating pattern: 3 meals and 2-3 snacks per day. Frequent foods and beverages: water, unsweet iced tea, diet soda, diet lemonade; boiled egg, salad, veg's, fish, chx, diet Jell-O, no-added applesauce, fresh fruits.   Avoided foods: none.   Usual physical activity: gardening, housework 3 hrs 2 days a week at [employer]; housekeeping for another 3 hrs/week.   Sleep: Estimates she gets ~6 hrs/night.    24-hr recall: (Up at 8 AM) B (8:30 AM)-   Granola bar, water, 1 fried egg Snk ( AM)-   --- L (11:30 PM)-  2-3 c vegetable-beef soup, diet Coke Snk (6 PM)-  1/2 PB sandwich, diet Jell-O, Crystal Light D (7 PM)-  1 bag  microwave popcorn, Crystal Light Snk ( PM)-  --- Typical day? Yes.    Nutritional Diagnosis:  NB-1.1 Food and nutrition-related knowledge deficit As related to glycemic control.  As evidenced by poorly balanced meals and marginal intake that increases risk of hypoglycemia at times and hyperglycemia at other times.  Handouts given during visit include: After-Visit Summary (AVS)  Demonstrated degree of understanding via:  Teach Back  Barriers to learning/adherence to lifestyle change: Limited DMST background.    Monitoring/Evaluation:  Dietary intake, exercise, Nichols levels, and body weight in 3 week(s).

## 2021-08-10 NOTE — Patient Instructions (Addendum)
  Pay attention to your blood glucose levels.  Every time you see your blood sugar above 200, write in your notebook: Time of day, and what your blood sugar reading is now What time you last ate What you last ate How much you ate Bring your notebook together with your monitor to your next nutrition appt.    Diet Recommendations for Diabetes  Carbohydrate includes starch, sugar, and fiber.  Of these, only sugar and starch raise blood glucose.  (Fiber is found in fruits, vegetables [especially skin, seeds, and stalks], whole grains, and beans.)   Starchy (carb) foods: Bread, rice, pasta, potatoes, corn, cereal, grits, crackers, bagels, muffins, all baked goods.  (Fruit, milk, and yogurt also have carbohydrate, but most of these foods will not spike your blood sugar as most starchy or sweet foods will.)  A few fruits do cause high blood sugars; use small portions of bananas (limit to 1/2 at a time), grapes, watermelon, and oranges.   Protein foods: Meat, fish, poultry, eggs, dairy foods, and beans such as pinto and kidney beans (beans also provide carbohydrate).   1. Eat at least 3 REAL meals and 1-2 snacks per day. Eat breakfast within the first hour of getting up.  Have something to eat at least every 5 hours while awake.   2. Limit starchy foods to TWO per meal and ONE per snack. ONE portion of a starchy food is equal to the following:   - ONE slice of bread (or its equivalent, such as half of a hamburger bun).   - 1/2 cup of a "scoopable" starchy food such as potatoes or rice.   - 15 grams of Total Carbohydrate as shown on food label.  3. Include twice the volume of vegetables as protein or carbohydrate foods for BOTH lunch and dinner as often as you can.   - Fresh or frozen vegetables are best.   - Keep frozen vegetables on hand for a quick option.       If you have questions, call Dr. Jenne Campus at [phone].    Follow-up appt in the office on Monday, November 21 at 2:30 PM.

## 2021-08-11 DIAGNOSIS — N189 Chronic kidney disease, unspecified: Secondary | ICD-10-CM | POA: Diagnosis not present

## 2021-08-11 DIAGNOSIS — E139 Other specified diabetes mellitus without complications: Secondary | ICD-10-CM | POA: Diagnosis not present

## 2021-08-11 DIAGNOSIS — E039 Hypothyroidism, unspecified: Secondary | ICD-10-CM | POA: Diagnosis not present

## 2021-08-11 DIAGNOSIS — E785 Hyperlipidemia, unspecified: Secondary | ICD-10-CM | POA: Diagnosis not present

## 2021-08-18 DIAGNOSIS — E139 Other specified diabetes mellitus without complications: Secondary | ICD-10-CM | POA: Diagnosis not present

## 2021-08-18 DIAGNOSIS — E039 Hypothyroidism, unspecified: Secondary | ICD-10-CM | POA: Diagnosis not present

## 2021-08-18 DIAGNOSIS — N189 Chronic kidney disease, unspecified: Secondary | ICD-10-CM | POA: Diagnosis not present

## 2021-08-18 DIAGNOSIS — E785 Hyperlipidemia, unspecified: Secondary | ICD-10-CM | POA: Diagnosis not present

## 2021-08-19 DIAGNOSIS — I10 Essential (primary) hypertension: Secondary | ICD-10-CM | POA: Diagnosis not present

## 2021-08-19 DIAGNOSIS — L97511 Non-pressure chronic ulcer of other part of right foot limited to breakdown of skin: Secondary | ICD-10-CM | POA: Diagnosis not present

## 2021-08-19 DIAGNOSIS — E139 Other specified diabetes mellitus without complications: Secondary | ICD-10-CM | POA: Diagnosis not present

## 2021-08-24 DIAGNOSIS — I10 Essential (primary) hypertension: Secondary | ICD-10-CM | POA: Diagnosis not present

## 2021-08-24 DIAGNOSIS — I480 Paroxysmal atrial fibrillation: Secondary | ICD-10-CM | POA: Diagnosis not present

## 2021-08-24 DIAGNOSIS — E139 Other specified diabetes mellitus without complications: Secondary | ICD-10-CM | POA: Diagnosis not present

## 2021-08-27 DIAGNOSIS — I10 Essential (primary) hypertension: Secondary | ICD-10-CM | POA: Diagnosis not present

## 2021-08-27 DIAGNOSIS — I480 Paroxysmal atrial fibrillation: Secondary | ICD-10-CM | POA: Diagnosis not present

## 2021-08-27 DIAGNOSIS — E139 Other specified diabetes mellitus without complications: Secondary | ICD-10-CM | POA: Diagnosis not present

## 2021-08-31 ENCOUNTER — Encounter (HOSPITAL_BASED_OUTPATIENT_CLINIC_OR_DEPARTMENT_OTHER): Payer: Self-pay

## 2021-08-31 ENCOUNTER — Ambulatory Visit: Payer: Medicare PPO | Admitting: Family Medicine

## 2021-08-31 ENCOUNTER — Other Ambulatory Visit: Payer: Self-pay

## 2021-08-31 ENCOUNTER — Inpatient Hospital Stay (HOSPITAL_BASED_OUTPATIENT_CLINIC_OR_DEPARTMENT_OTHER)
Admission: EM | Admit: 2021-08-31 | Discharge: 2021-09-04 | DRG: 309 | Disposition: A | Discharge status: Home / Self Care | Payer: Medicare PPO | Attending: Family Medicine | Admitting: Family Medicine

## 2021-08-31 DIAGNOSIS — Z833 Family history of diabetes mellitus: Secondary | ICD-10-CM | POA: Diagnosis not present

## 2021-08-31 DIAGNOSIS — Z20822 Contact with and (suspected) exposure to covid-19: Secondary | ICD-10-CM | POA: Diagnosis present

## 2021-08-31 DIAGNOSIS — I4891 Unspecified atrial fibrillation: Secondary | ICD-10-CM | POA: Diagnosis not present

## 2021-08-31 DIAGNOSIS — I4892 Unspecified atrial flutter: Secondary | ICD-10-CM | POA: Diagnosis present

## 2021-08-31 DIAGNOSIS — E11649 Type 2 diabetes mellitus with hypoglycemia without coma: Secondary | ICD-10-CM | POA: Diagnosis not present

## 2021-08-31 DIAGNOSIS — Z9641 Presence of insulin pump (external) (internal): Secondary | ICD-10-CM | POA: Diagnosis present

## 2021-08-31 DIAGNOSIS — N179 Acute kidney failure, unspecified: Secondary | ICD-10-CM

## 2021-08-31 DIAGNOSIS — E1165 Type 2 diabetes mellitus with hyperglycemia: Secondary | ICD-10-CM | POA: Diagnosis present

## 2021-08-31 DIAGNOSIS — Z87891 Personal history of nicotine dependence: Secondary | ICD-10-CM

## 2021-08-31 DIAGNOSIS — Z79899 Other long term (current) drug therapy: Secondary | ICD-10-CM | POA: Diagnosis not present

## 2021-08-31 DIAGNOSIS — E139 Other specified diabetes mellitus without complications: Secondary | ICD-10-CM | POA: Diagnosis present

## 2021-08-31 DIAGNOSIS — R9431 Abnormal electrocardiogram [ECG] [EKG]: Secondary | ICD-10-CM

## 2021-08-31 DIAGNOSIS — I480 Paroxysmal atrial fibrillation: Secondary | ICD-10-CM | POA: Diagnosis present

## 2021-08-31 DIAGNOSIS — E876 Hypokalemia: Secondary | ICD-10-CM | POA: Diagnosis present

## 2021-08-31 DIAGNOSIS — I10 Essential (primary) hypertension: Secondary | ICD-10-CM

## 2021-08-31 DIAGNOSIS — Z8249 Family history of ischemic heart disease and other diseases of the circulatory system: Secondary | ICD-10-CM | POA: Diagnosis not present

## 2021-08-31 DIAGNOSIS — E039 Hypothyroidism, unspecified: Secondary | ICD-10-CM | POA: Diagnosis present

## 2021-08-31 DIAGNOSIS — Z794 Long term (current) use of insulin: Secondary | ICD-10-CM

## 2021-08-31 DIAGNOSIS — R739 Hyperglycemia, unspecified: Secondary | ICD-10-CM

## 2021-08-31 DIAGNOSIS — Z79890 Hormone replacement therapy (postmenopausal): Secondary | ICD-10-CM

## 2021-08-31 DIAGNOSIS — Z83438 Family history of other disorder of lipoprotein metabolism and other lipidemia: Secondary | ICD-10-CM | POA: Diagnosis not present

## 2021-08-31 DIAGNOSIS — N1831 Chronic kidney disease, stage 3a: Secondary | ICD-10-CM

## 2021-08-31 DIAGNOSIS — I959 Hypotension, unspecified: Secondary | ICD-10-CM | POA: Diagnosis not present

## 2021-08-31 DIAGNOSIS — N1832 Chronic kidney disease, stage 3b: Secondary | ICD-10-CM

## 2021-08-31 DIAGNOSIS — E785 Hyperlipidemia, unspecified: Secondary | ICD-10-CM | POA: Diagnosis present

## 2021-08-31 DIAGNOSIS — Z7901 Long term (current) use of anticoagulants: Secondary | ICD-10-CM | POA: Diagnosis not present

## 2021-08-31 LAB — BASIC METABOLIC PANEL
Anion gap: 12 (ref 5–15)
BUN: 38 mg/dL — ABNORMAL HIGH (ref 8–23)
CO2: 23 mmol/L (ref 22–32)
Calcium: 9.8 mg/dL (ref 8.9–10.3)
Chloride: 91 mmol/L — ABNORMAL LOW (ref 98–111)
Creatinine, Ser: 1.44 mg/dL — ABNORMAL HIGH (ref 0.44–1.00)
GFR, Estimated: 36 mL/min — ABNORMAL LOW (ref >60)
Glucose, Bld: 600 mg/dL (ref 70–99)
Potassium: 4.4 mmol/L (ref 3.5–5.1)
Sodium: 126 mmol/L — ABNORMAL LOW (ref 135–145)

## 2021-08-31 LAB — CBC
HCT: 39.8 % (ref 36.0–46.0)
Hemoglobin: 13 g/dL (ref 12.0–15.0)
MCH: 27.5 pg (ref 26.0–34.0)
MCHC: 32.7 g/dL (ref 30.0–36.0)
MCV: 84.1 fL (ref 80.0–100.0)
Platelets: 275 10*3/uL (ref 150–400)
RBC: 4.73 MIL/uL (ref 3.87–5.11)
RDW: 13.2 % (ref 11.5–15.5)
WBC: 6.2 10*3/uL (ref 4.0–10.5)
nRBC: 0 % (ref 0.0–0.2)

## 2021-08-31 LAB — CBG MONITORING, ED
Glucose-Capillary: 575 mg/dL (ref 70–99)
Glucose-Capillary: 439 mg/dL — ABNORMAL HIGH (ref 70–99)
Glucose-Capillary: 423 mg/dL — ABNORMAL HIGH (ref 70–99)
Glucose-Capillary: 425 mg/dL — ABNORMAL HIGH (ref 70–99)

## 2021-08-31 LAB — URINALYSIS, ROUTINE W REFLEX MICROSCOPIC
Bilirubin Urine: NEGATIVE
Glucose, UA: >1000 mg/dL — AB
Hgb urine dipstick: NEGATIVE
Ketones, ur: 15 mg/dL — AB
Leukocytes,Ua: NEGATIVE
Nitrite: NEGATIVE
Protein, ur: NEGATIVE mg/dL
Specific Gravity, Urine: 1.021 (ref 1.005–1.030)
pH: 5 (ref 5.0–8.0)

## 2021-08-31 LAB — RESP PANEL BY RT-PCR (FLU A&B, COVID) ARPGX2
Influenza A by PCR: NEGATIVE
Influenza B by PCR: NEGATIVE
SARS Coronavirus 2 by RT PCR: NEGATIVE

## 2021-08-31 MED ORDER — LACTATED RINGERS IV BOLUS
1000.0000 mL | Freq: Once | INTRAVENOUS | Status: AC
  Administered 2021-08-31: 1000 mL via INTRAVENOUS

## 2021-08-31 MED ORDER — DILTIAZEM HCL-DEXTROSE 125-5 MG/125ML-% IV SOLN (PREMIX)
5.0000 mg/h | INTRAVENOUS | Status: AC
  Administered 2021-08-31: 19:00:00 5 mg/h via INTRAVENOUS
  Administered 2021-09-01: 10 mg/h via INTRAVENOUS
  Filled 2021-08-31 (×2): qty 125

## 2021-08-31 MED ORDER — DILTIAZEM HCL 25 MG/5ML IV SOLN
10.0000 mg | Freq: Once | INTRAVENOUS | Status: AC
  Administered 2021-08-31: 10 mg via INTRAVENOUS
  Filled 2021-08-31: qty 5

## 2021-08-31 MED ORDER — DILTIAZEM LOAD VIA INFUSION
10.0000 mg | Freq: Once | INTRAVENOUS | Status: AC
  Administered 2021-08-31: 10 mg via INTRAVENOUS
  Filled 2021-08-31: qty 10

## 2021-08-31 MED ORDER — INSULIN ASPART 100 UNIT/ML IJ SOLN
12.0000 [IU] | Freq: Once | INTRAMUSCULAR | Status: AC
  Administered 2021-08-31: 12 [IU] via SUBCUTANEOUS

## 2021-08-31 NOTE — ED Notes (Signed)
Hr 110 BP 122/71 Increased to 7.5mg /hr

## 2021-08-31 NOTE — ED Notes (Signed)
Admitting MD messaged re: BS of [phone] states to ask ED provider ED provider has been messaged

## 2021-08-31 NOTE — ED Notes (Signed)
[facility] to transport pt.  Report given to [facility]

## 2021-08-31 NOTE — ED Notes (Addendum)
Pt up to Restroom without difficulty, delay explained to pt and dtg.

## 2021-08-31 NOTE — ED Notes (Signed)
Pt ate 100% of meal, awaiting bed placement.  Dtg at bedside.   Cardizem drip increased to 10mg /hr.  Continues to be in atrial fib/flutter rate 120-138 Bp 119/58

## 2021-08-31 NOTE — ED Triage Notes (Signed)
Patient here POV from Home with [place].  Patient states she has been having Malaise for approximately 1 week. Patient states she does not have any discernable symptoms besides "not feeling well'. Patient does note recent change in BP Medication and difficulty in controlling her BG.   NAD Noted during Triage. A&Ox4. GCS 15. BIB Wheelchair.

## 2021-08-31 NOTE — ED Provider Notes (Signed)
[place] EMERGENCY DEPT Provider Note   CSN: [id number] Arrival date & time: 08/31/21  1422     History Chief Complaint  Patient presents with   Fatigue    [name] is a 86 y.o. female.  HPI 86 year old female presents with a chief complaint of lightheadedness. She has been feeling "off" for a week or so. Mostly this seems to mean weakness/fatigue. No pain. No fevers or cough. Today she was bad enough she had to leave work. Was lightheaded, and wondered if it was a sugar problem so she went to [employer] and got a fish sandwich. Got worse, and got blurry vision. Called daughter and came here. Glucose has been running high recently, even one time reading as "hi" on her monitor. She reports compliance with her meds.   Past Medical History:  Diagnosis Date   Colon polyp    Diabetes mellitus    Hyperlipidemia    Hypertension    Poor appetite    Thyroid disease     Patient Active Problem List   Diagnosis Date Noted   COVID-19 virus infection 03/13/2021   DKA (diabetic ketoacidosis) ([place]) 03/13/2021   Hyperglycemia 03/23/2020   Uncontrolled type 2 diabetes mellitus with hyperglycemia ([place]) 10/26/2018   Hypercholesterolemia 03/15/2018   Atrial fibrillation with RVR ([place]) 08/18/2017   Left hip pain 06/07/2017   Hypothyroidism 02/03/2015   Essential hypertension 07/10/2013   Type 2 diabetes mellitus ([place]) 07/10/2013   Upper respiratory infection 07/10/2013   PAF (paroxysmal atrial fibrillation) ([place]) 12/26/2012   Postop check 04/13/2011    Past Surgical History:  Procedure Laterality Date   BREAST EXCISIONAL BIOPSY Right    CARDIOVASCULAR STRESS TEST  07/15/2006   Normal scan, no ECG changes   TRANSTHORACIC ECHOCARDIOGRAM  01/08/2011   EF 60-65%, moderae mitral regurg, LA mild-moderately dilated,     OB History   No obstetric history on file.     Family History  Problem Relation Age of Onset   Diabetes Mother    Hypertension Father     Hyperlipidemia Father    Diabetes Sister    Hypertension Sister    Hyperlipidemia Sister    Hypertension Brother    Stroke Sister    Hypertension Sister    Diabetes Sister    Cancer Daughter    Heart failure Child    Breast cancer Neg Hx     Social History   Tobacco Use   Smoking status: Former    Types: Cigarettes    Quit date: 05/18/2003    Years since quitting: 18.3   Smokeless tobacco: Never  Vaping Use   Vaping Use: Never used  Substance Use Topics   Alcohol use: No   Drug use: No    Home Medications Prior to Admission medications   Medication Sig Start Date End Date Taking? Authorizing Provider  apixaban (ELIQUIS) 2.5 MG TABS tablet Take 1 tablet (2.5 mg total) by mouth 2 (two) times daily. 12/18/20   Hilty, Nadean Corwin, MD  atorvastatin (LIPITOR) 20 MG tablet Take 20 mg by mouth daily. 02/01/20   [provider]  insulin aspart (NOVOLOG FLEXPEN) 100 UNIT/ML FlexPen Inject 4-12 Units into the skin See admin instructions. Before each meal 3 times a day, 140-199 - 4 units, 200-250 - 6 units, 251-299 - 8 units,  300-349 - 12 units,  350 or above 15 units. 03/16/21   Thurnell Lose, MD  insulin glargine, 2 Unit Dial, (TOUJEO MAX SOLOSTAR) 300 UNIT/ML Solostar Pen  Inject 26 Units into the skin daily. 03/16/21   Thurnell Lose, MD  levothyroxine (SYNTHROID) 75 MCG tablet Take 75 mcg by mouth daily before breakfast. 03/09/21   [provider]  metoprolol succinate (TOPROL-XL) 50 MG 24 hr tablet TAKE 1 TABLET BY MOUTH TWICE A DAY Patient taking differently: Take 50 mg by mouth 2 (two) times daily. 02/09/21   Hilty, Nadean Corwin, MD  TIADYLT ER 180 MG 24 hr capsule TAKE 1 CAPSULE BY MOUTH EVERY DAY Patient taking differently: Take 180 mg by mouth daily at 12 noon. 11/17/20   Hilty, Nadean Corwin, MD    Allergies    Patient has no known allergies.  Review of Systems   Review of Systems  Constitutional:  Positive for fatigue. Negative for fever.  Eyes:  Positive for  visual disturbance.  Respiratory:  Negative for cough and shortness of breath.   Cardiovascular:  Negative for chest pain.  Gastrointestinal:  Negative for abdominal pain, diarrhea and vomiting.  Endocrine: Positive for polydipsia and polyuria.  Genitourinary:  Negative for dysuria.  Neurological:  Positive for light-headedness. Negative for numbness and headaches.  All other systems reviewed and are negative.  Physical Exam Updated Vital Signs BP 125/73   Pulse 96   Temp 98 F (36.7 C)   Resp 19   Ht 5\' 1"  (1.549 m)   Wt 62.4 kg   SpO2 100%   BMI 25.99 kg/m   Physical Exam Vitals and nursing note reviewed.  Constitutional:      General: She is not in acute distress.    Appearance: She is well-developed. She is not ill-appearing or diaphoretic.  HENT:     Head: Normocephalic and atraumatic.     Right Ear: External ear normal.     Left Ear: External ear normal.     Nose: Nose normal.     Mouth/Throat:     Mouth: Mucous membranes are dry.  Eyes:     General:        Right eye: No discharge.        Left eye: No discharge.     Extraocular Movements: Extraocular movements intact.     Pupils: Pupils are equal, round, and reactive to light.  Cardiovascular:     Rate and Rhythm: Tachycardia present. Rhythm irregular.     Heart sounds: Normal heart sounds.  Pulmonary:     Effort: Pulmonary effort is normal.     Breath sounds: Normal breath sounds.  Abdominal:     Palpations: Abdomen is soft.     Tenderness: There is no abdominal tenderness.  Musculoskeletal:     Cervical back: No rigidity.  Skin:    General: Skin is warm and dry.  Neurological:     Mental Status: She is alert.     Comments: CN 3-12 grossly intact. 5/5 strength in all 4 extremities. Grossly normal sensation. Normal finger to nose.   Psychiatric:        Mood and Affect: Mood is not anxious.    ED Results / Procedures / Treatments   Labs (all labs ordered are listed, but only abnormal results are  displayed) Labs Reviewed  BASIC METABOLIC PANEL - Abnormal; Notable for the following components:      Result Value   Sodium 126 (*)    Chloride 91 (*)    Glucose, Bld 600 (*)    BUN 38 (*)    Creatinine, Ser 1.44 (*)    GFR, Estimated 36 (*)  All other components within normal limits  URINALYSIS, ROUTINE W REFLEX MICROSCOPIC - Abnormal; Notable for the following components:   Glucose, UA >1,000 (*)    Ketones, ur 15 (*)    All other components within normal limits  CBG MONITORING, ED - Abnormal; Notable for the following components:   Glucose-Capillary 575 (*)    All other components within normal limits  CBG MONITORING, ED - Abnormal; Notable for the following components:   Glucose-Capillary 439 (*)    All other components within normal limits  CBG MONITORING, ED - Abnormal; Notable for the following components:   Glucose-Capillary 423 (*)    All other components within normal limits  CBG MONITORING, ED - Abnormal; Notable for the following components:   Glucose-Capillary 425 (*)    All other components within normal limits  RESP PANEL BY RT-PCR (FLU A&B, COVID) [id number]  CBC    EKG EKG Interpretation  Date/Time:  Monday August 31 2021 15:57:09 EST Ventricular Rate:  129 PR Interval:    QRS Duration: 86 QT Interval:  331 QTC Calculation: 485 R Axis:   -2 Text Interpretation: Atrial fibrillation with RVR Repolarization abnormality, prob rate related Confirmed by Sherwood Gambler [id number]) on 08/31/2021 4:19:53 PM  Radiology No results found.  Procedures .Critical Care Performed by: Sherwood Gambler, MD Authorized by: Sherwood Gambler, MD   Critical care provider statement:    Critical care time (minutes):  40   Critical care time was exclusive of:  Separately billable procedures and treating other patients   Critical care was necessary to treat or prevent imminent or life-threatening deterioration of the following conditions:  Cardiac failure and endocrine crisis    Critical care was time spent personally by me on the following activities:  Development of treatment plan with patient or surrogate, discussions with consultants, evaluation of patient's response to treatment, examination of patient, ordering and review of laboratory studies, ordering and review of radiographic studies, ordering and performing treatments and interventions, pulse oximetry, re-evaluation of patient's condition and review of old charts   Medications Ordered in ED Medications  diltiazem (CARDIZEM) 1 mg/mL load via infusion 10 mg (10 mg Intravenous Bolus from Bag 08/31/21 1904)    And  diltiazem (CARDIZEM) 125 mg in dextrose 5% 125 mL (1 mg/mL) infusion (5 mg/hr Intravenous New Bag/Given 08/31/21 1907)  lactated ringers bolus 1,000 mL (0 mLs Intravenous Stopped 08/31/21 1918)  diltiazem (CARDIZEM) injection 10 mg (10 mg Intravenous Given 08/31/21 1730)  lactated ringers bolus 1,000 mL (1,000 mLs Intravenous New Bag/Given 08/31/21 1733)  insulin aspart (novoLOG) injection 12 Units (12 Units Subcutaneous Given 08/31/21 2053)    ED Course  I have reviewed the triage vital signs and the nursing notes.  Pertinent labs & imaging results that were available during my care of the patient were reviewed by me and considered in my medical decision making (see chart for details).    MDM Rules/Calculators/A&P                           Patient is found to be in A. fib with RVR.  She is also hyperglycemic but not in DKA.  She was given IV fluids as she does have a bump in her creatinine.  Unfortunately her heart rate has remained high and so she was given diltiazem and when this did not seem to but she was given diltiazem bolus and then drip.  She will need admission for heart rate control  and glucose control.  Discussed with Dr. Hal Hope for admission.  There is no obvious infectious cause. Final Clinical Impression(s) / ED Diagnoses Final diagnoses:  Atrial fibrillation with RVR ([place])   Hyperglycemia    Rx / DC Orders ED Discharge Orders     None        Sherwood Gambler, MD 08/31/21 2339

## 2021-08-31 NOTE — ED Notes (Signed)
Report called to [place] at [facility]

## 2021-08-31 NOTE — ED Notes (Signed)
Provided Patient frozen meal and drink

## 2021-09-01 ENCOUNTER — Observation Stay (HOSPITAL_COMMUNITY): Payer: Medicare PPO

## 2021-09-01 ENCOUNTER — Encounter (HOSPITAL_COMMUNITY): Payer: Self-pay | Admitting: Internal Medicine

## 2021-09-01 DIAGNOSIS — I4891 Unspecified atrial fibrillation: Secondary | ICD-10-CM

## 2021-09-01 DIAGNOSIS — N1832 Chronic kidney disease, stage 3b: Secondary | ICD-10-CM

## 2021-09-01 DIAGNOSIS — R9431 Abnormal electrocardiogram [ECG] [EKG]: Secondary | ICD-10-CM

## 2021-09-01 DIAGNOSIS — I10 Essential (primary) hypertension: Secondary | ICD-10-CM

## 2021-09-01 DIAGNOSIS — N179 Acute kidney failure, unspecified: Secondary | ICD-10-CM

## 2021-09-01 DIAGNOSIS — N1831 Chronic kidney disease, stage 3a: Secondary | ICD-10-CM

## 2021-09-01 LAB — CBC
HCT: 41.4 % (ref 36.0–46.0)
Hemoglobin: 13.6 g/dL (ref 12.0–15.0)
MCH: 27.9 pg (ref 26.0–34.0)
MCHC: 32.9 g/dL (ref 30.0–36.0)
MCV: 85 fL (ref 80.0–100.0)
Platelets: 245 10*3/uL (ref 150–400)
RBC: 4.87 MIL/uL (ref 3.87–5.11)
RDW: 13.5 % (ref 11.5–15.5)
WBC: 7.4 10*3/uL (ref 4.0–10.5)
nRBC: 0 % (ref 0.0–0.2)

## 2021-09-01 LAB — MRSA NEXT GEN BY PCR, NASAL: MRSA by PCR Next Gen: NOT DETECTED

## 2021-09-01 LAB — ECHOCARDIOGRAM COMPLETE
Area-P 1/2: 2.53 cm2
Calc EF: 72.2 %
Height: 62 in
S' Lateral: 2.2 cm
Single Plane A2C EF: 73.8 %
Single Plane A4C EF: 64.9 %
Weight: 2176.38 oz

## 2021-09-01 LAB — BASIC METABOLIC PANEL
Anion gap: 10 (ref 5–15)
BUN: 25 mg/dL — ABNORMAL HIGH (ref 8–23)
CO2: 22 mmol/L (ref 22–32)
Calcium: 8.8 mg/dL — ABNORMAL LOW (ref 8.9–10.3)
Chloride: 99 mmol/L (ref 98–111)
Creatinine, Ser: 0.87 mg/dL (ref 0.44–1.00)
GFR, Estimated: >60 mL/min (ref >60)
Glucose, Bld: 368 mg/dL — ABNORMAL HIGH (ref 70–99)
Potassium: 3.4 mmol/L — ABNORMAL LOW (ref 3.5–5.1)
Sodium: 131 mmol/L — ABNORMAL LOW (ref 135–145)

## 2021-09-01 LAB — GLUCOSE, CAPILLARY
Glucose-Capillary: 110 mg/dL — ABNORMAL HIGH (ref 70–99)
Glucose-Capillary: 124 mg/dL — ABNORMAL HIGH (ref 70–99)
Glucose-Capillary: 289 mg/dL — ABNORMAL HIGH (ref 70–99)
Glucose-Capillary: 325 mg/dL — ABNORMAL HIGH (ref 70–99)
Glucose-Capillary: 351 mg/dL — ABNORMAL HIGH (ref 70–99)
Glucose-Capillary: 392 mg/dL — ABNORMAL HIGH (ref 70–99)

## 2021-09-01 LAB — HEMOGLOBIN A1C
Hgb A1c MFr Bld: 11.8 % — ABNORMAL HIGH (ref 4.8–5.6)
Mean Plasma Glucose: 291.96 mg/dL

## 2021-09-01 MED ORDER — CHLORHEXIDINE GLUCONATE CLOTH 2 % EX PADS
6.0000 | MEDICATED_PAD | Freq: Every day | CUTANEOUS | Status: DC
  Administered 2021-09-01 – 2021-09-03 (×3): 6 via TOPICAL

## 2021-09-01 MED ORDER — IRBESARTAN 75 MG PO TABS
75.0000 mg | ORAL_TABLET | Freq: Every day | ORAL | Status: DC
  Administered 2021-09-02: 75 mg via ORAL
  Filled 2021-09-01 (×2): qty 1

## 2021-09-01 MED ORDER — ATORVASTATIN CALCIUM 20 MG PO TABS
20.0000 mg | ORAL_TABLET | Freq: Every day | ORAL | Status: DC
  Administered 2021-09-01 – 2021-09-04 (×4): 20 mg via ORAL
  Filled 2021-09-01 (×3): qty 2
  Filled 2021-09-01: qty 1

## 2021-09-01 MED ORDER — DILTIAZEM HCL 60 MG PO TABS
60.0000 mg | ORAL_TABLET | Freq: Four times a day (QID) | ORAL | Status: DC
  Administered 2021-09-01 – 2021-09-04 (×12): 60 mg via ORAL
  Filled 2021-09-01 (×12): qty 1

## 2021-09-01 MED ORDER — IRBESARTAN 150 MG PO TABS
150.0000 mg | ORAL_TABLET | Freq: Every day | ORAL | Status: DC
  Administered 2021-09-01: 150 mg via ORAL
  Filled 2021-09-01: qty 1

## 2021-09-01 MED ORDER — LEVOTHYROXINE SODIUM 75 MCG PO TABS
75.0000 ug | ORAL_TABLET | Freq: Every day | ORAL | Status: DC
Start: 2021-09-01 — End: 2021-09-04
  Administered 2021-09-01 – 2021-09-04 (×4): 75 ug via ORAL
  Filled 2021-09-01 (×4): qty 1

## 2021-09-01 MED ORDER — SENNOSIDES-DOCUSATE SODIUM 8.6-50 MG PO TABS: 1.0000 | ORAL_TABLET | Freq: Every evening | ORAL | Status: DC | PRN

## 2021-09-01 MED ORDER — INSULIN GLARGINE (2 UNIT DIAL) 300 UNIT/ML ~~LOC~~ SOPN: 25.0000 [IU] | PEN_INJECTOR | Freq: Every day | SUBCUTANEOUS | Status: DC

## 2021-09-01 MED ORDER — APIXABAN 2.5 MG PO TABS
2.5000 mg | ORAL_TABLET | Freq: Two times a day (BID) | ORAL | Status: DC
  Administered 2021-09-01 – 2021-09-04 (×7): 2.5 mg via ORAL
  Filled 2021-09-01 (×7): qty 1

## 2021-09-01 MED ORDER — ACETAMINOPHEN 325 MG PO TABS: 650.0000 mg | ORAL_TABLET | Freq: Four times a day (QID) | ORAL | Status: DC | PRN

## 2021-09-01 MED ORDER — INSULIN ASPART 100 UNIT/ML IJ SOLN
0.0000 [IU] | Freq: Three times a day (TID) | INTRAMUSCULAR | Status: DC
  Administered 2021-09-01: 11 [IU] via SUBCUTANEOUS
  Administered 2021-09-01: 15 [IU] via SUBCUTANEOUS

## 2021-09-01 MED ORDER — LABETALOL HCL 5 MG/ML IV SOLN
5.0000 mg | INTRAVENOUS | Status: DC | PRN
  Administered 2021-09-02: 5 mg via INTRAVENOUS
  Filled 2021-09-01: qty 4

## 2021-09-01 MED ORDER — ACETAMINOPHEN 650 MG RE SUPP: 650.0000 mg | Freq: Four times a day (QID) | RECTAL | Status: DC | PRN

## 2021-09-01 MED ORDER — LACTATED RINGERS IV SOLN: INTRAVENOUS | Status: DC

## 2021-09-01 MED ORDER — INSULIN GLARGINE-YFGN 100 UNIT/ML ~~LOC~~ SOLN
25.0000 [IU] | Freq: Every day | SUBCUTANEOUS | Status: DC
  Administered 2021-09-01: 25 [IU] via SUBCUTANEOUS
  Filled 2021-09-01 (×2): qty 0.25

## 2021-09-01 MED ORDER — METOPROLOL TARTRATE 25 MG PO TABS
25.0000 mg | ORAL_TABLET | Freq: Two times a day (BID) | ORAL | Status: DC
  Administered 2021-09-01 – 2021-09-04 (×6): 25 mg via ORAL
  Filled 2021-09-01 (×6): qty 1

## 2021-09-01 MED ORDER — INSULIN ASPART 100 UNIT/ML IJ SOLN
0.0000 [IU] | Freq: Every day | INTRAMUSCULAR | Status: DC
  Administered 2021-09-01: 3 [IU] via SUBCUTANEOUS

## 2021-09-01 MED ORDER — INSULIN ASPART 100 UNIT/ML IJ SOLN
8.0000 [IU] | Freq: Once | INTRAMUSCULAR | Status: AC
  Administered 2021-09-01: 8 [IU] via SUBCUTANEOUS

## 2021-09-01 MED ORDER — TRIAMTERENE-HCTZ 37.5-25 MG PO TABS
1.0000 | ORAL_TABLET | Freq: Every morning | ORAL | Status: DC
  Administered 2021-09-01: 1 via ORAL
  Filled 2021-09-01: qty 1

## 2021-09-01 NOTE — Progress Notes (Addendum)
Inpatient Diabetes Program Recommendations  AACE/ADA: New Consensus Statement on Inpatient Glycemic Control (2015)  Target Ranges:  Prepandial:   less than 140 mg/dL      Peak postprandial:   less than 180 mg/dL (1-2 hours)      Critically ill patients:  140 - 180 mg/dL    Latest Reference Range & Units 08/31/21 15:15  Glucose 70 - 99 mg/dL 600 (HH)    Latest Reference Range & Units 08/31/21 14:32 08/31/21 17:40 08/31/21 19:29 08/31/21 22:29 09/01/21 00:16 09/01/21 06:21  Glucose-Capillary 70 - 99 mg/dL 575 (HH) 439 (H) 423 (H)  12 units Novolog @2053  425 (H) 351 (H)  8 units Novolog @0215  124 (H)    Latest Reference Range & Units 03/13/21 22:00 09/01/21 02:52  Hemoglobin A1C 4.8 - 5.6 % 15.2 (H) 11.8 (H)  (291 mg/dl)    Admit with: Atrial fibrillation with RVR/ Uncontrolled type 2 diabetes mellitus with hyperglycemia   Pt states that has been running high for the last week ever since she was changed to a insulin patch that she is supposed to wear for 3 days and then change.  She does state that she uses it for longer than the 3 days.  She pushes the buttons to deliver herself 2 units of insulin with each push on the patch but despite doing this her blood sugars continue to run high.  She states her blood sugars were not as high when she was using insulin pens previously  History: DM  Home DM Meds: Novolog 4-12 units TID per SSI       Toujeo 26 units daily         Current Orders: Semglee 25 units daily      Novolog Moderate Correction Scale/ SSI (0-15 units) TID AC + HS    MD- Per Pharmacist at [place] office, pt only taking 8 units Toujeo insulin Daily at home Pt received 25 units Semglee this AM May consider reducing the Semglee to 12 units Daily starting tomorrow 11/23     Spoke with Pharmacist at PCP office (Dr. Shelia Media).  Per Pharmacist, pt coming in often for insulin adjustments, etc.  Pt was recently placed on a device called Cequr--This device holds 200 units of  rapid-acting insulin (Per pharmacist pt uses Fiasp insulin in her Cequr)--The patch is filled and placed on the abdomen with an applicator and then the pt can deliver insulin with "Clicks"--each click administers 2 units of the Fiasp.  Pharmacist told me pt should be taking Toujeo 8 units Daily with an insulin pen and then 2 clicks (4 units of Fiasp) with Breakfast, 3 clicks (6 units Fiasp) with Lunch, and 2 clicks (4 units of Fiasp) with [employer]  Per Pharmacist, pt still not able to change the Cequr device on her own and relies on the office to fill and change the device.  Recommend we d/c the Insulin patch at time of discharge and have pt take Novolog injections with Insulin Pen (can use doses pt was supposed to be taking with the patch--4 units with Breakfast/ 6 units with Lunch/ 4 units with Dinner)--May also need to give pt a Copy of the Novolog 0-9 Sensitive SSI that she can use for elevated CBGs.  Discussed above with the pt and she is agreeable---reminded pt to NOT give herself any insulin "clicks" with her Cequr patch while in the hospital--pt stated understanding.  Pt requesting refills of her Toujeo and Novolog insulin pens (has 1 Novolog pen at home) [employer]  Insulin Pen- Order # [id number] Toujeo Insulin Pen- Order # [id number]   Per record review, has seen the Diabetes Educator [place] Reader 4 times since June 2021  PCP: Dr. Shelia Media with [facility]  Due to start [facility] and Novolog SSI this AM    --Will follow patient during hospitalization--  Wyn Quaker RN, MSN, CDE Diabetes Coordinator Inpatient Glycemic Control Team Team Pager: [phone] (8a-5p)

## 2021-09-01 NOTE — Progress Notes (Signed)
This RN and diabetic educator as well discussed with patient in depth about not using her own insulin pump to dose herself. Pt verbalized understanding of the importance for letting staff check her sugar and administer insulin according to MD's order. All questions answered and pt in agreement. Will continue to assess

## 2021-09-01 NOTE — Progress Notes (Signed)
  Echocardiogram 2D Echocardiogram has been performed.  Bobbye Charleston 09/01/2021, 3:06 PM

## 2021-09-01 NOTE — Progress Notes (Signed)
PROGRESS NOTE    [name]  WIO:[id number] DOB: 1934/11/21 DOA: 08/31/2021 PCP: Deland Pretty, MD    Brief Narrative:  86 y.o. female with medical history significant for DMT2, HTN, HLD, PAF who Zentz for evaluation of not feeling well.  She reports that for the last week she has felt "off".  She states she has had generalized weakness, fatigue and is felt lightheaded at times.  She has not had any loss of consciousness and has not had any falls.  She states that she felt so bad she had to leave work today and thought that she might be hungry so she went to [employer] and got a fish sandwich but only took 2 bites and then felt even worse and developed some blurry vision.  She has not had any fevers, chills, cough, shortness of breath, urinary symptoms, nausea vomiting or diarrhea.  She was taken to the emergency room and found that her blood sugar was over 600.  She states that has been running high for the last week ever since she was changed to a insulin patch that she is supposed to wear for 3 days and then change.  She does state that she uses it for longer than the 3 days.  She pushes the buttons to deliver herself 2 units of insulin with each push on the patch but despite doing this her blood sugars continue to run high.  She states her blood sugars were not as high when she was using insulin pens previously.  Her last hemoglobin A1c was in June 2022 and was over 15.  She states she is been compliant with her medications  While in ED, pt went into afib RVR and started on cardizem gtt. Hospitalist consulted for consideration for medical admission  Assessment & Plan:   Principal Problem:   Atrial fibrillation with RVR ([place]) Active Problems:   Essential hypertension   Hypothyroidism   Uncontrolled type 2 diabetes mellitus with hyperglycemia (HCC)   AKI (acute kidney injury) ([place])   Prolonged QT interval  Principal Problem:   Atrial fibrillation with RVR -Patient was continued on  Cardizem drip overnight with rate controlled -This morning, cortisone drip has since been weaned off, transition to oral Cardizem -Of note, patient has soft blood pressures this morning thus would need to be cognizant of hypotension -If blood pressure allows, would ultimately resume home beta-blocker as well -And to monitor through today on oral regimen to ensure rate control -Repeat basic metabolic panel in the morning -2D echocardiogram ordered, pending -Continue Eliquis   Active Problems:   Uncontrolled type 2 diabetes mellitus with hyperglycemia  -Reportedly had difficulty with glycemic control on insulin patch -Currently on subcu insulin with sliding scale regimen -Appreciate input by diabetic coordinator.  Recommendation to DC insulin patch and have patient start NovoLog 4 units with breakfast, 6 units with lunch, 4 units with dinner -Continue sliding scale insulin regimen -Hemoglobin A1c 11.8, down from 15.2 in June 2022     AKI (acute kidney injury) Renal function normalized with IV fluids given overnight Repeat basic metabolic panel in the morning     Essential hypertension Patient is continued on Maxide, irbesartan -Continued on Cardizem -Blood pressure soft overnight and this morning, if blood pressure would tolerate, would consider resuming beta-blocker     Hypothyroidism Continue levothyroxine as tolerated     Prolonged QT interval Avoid medications which could further prolong QT interval    DVT prophylaxis: Eliquis Code Status: Full Family Communication: Pt in room,  family not at bedside  Status is: Observation  The patient remains OBS appropriate and will d/c before 2 midnights.   Consultants:    Procedures:    Antimicrobials: Anti-infectives (From admission, onward)    None       Subjective: Feels better today  Objective: Vitals:   09/01/21 1030 09/01/21 1100 09/01/21 1130 09/01/21 1150  BP: (!) 105/44 (!) 96/47 (!) 107/39   Pulse: (!) 102  77 (!) 39   Resp: 20 (!) 21 16   Temp:    98 F (36.7 C)  TempSrc:    Oral  SpO2: 98% 99% 97%   Weight:      Height:        Intake/Output Summary (Last 24 hours) at 09/01/2021 1232 Last data filed at 09/01/2021 1149 Gross per 24 hour  Intake 2348.03 ml  Output --  Net 2348.03 ml   Filed Weights   08/31/21 1438 09/01/21 0000  Weight: 62.4 kg 61.7 kg    Examination: General exam: Awake, laying in bed, in nad Respiratory system: Normal respiratory effort, no wheezing Cardiovascular system: regular rate, s1, s2 Gastrointestinal system: Soft, nondistended, positive BS Central nervous system: CN2-12 grossly intact, strength intact Extremities: Perfused, no clubbing Skin: Normal skin turgor, no notable skin lesions seen Psychiatry: Mood normal // no visual hallucinations   Data Reviewed: I have personally reviewed following labs and imaging studies  CBC: Recent Labs  Lab 08/31/21 1515 09/01/21 0252  WBC 6.2 7.4  HGB 13.0 13.6  HCT 39.8 41.4  MCV 84.1 85.0  PLT 275 086   Basic Metabolic Panel: Recent Labs  Lab 08/31/21 1515 09/01/21 0252  NA 126* 131*  K 4.4 3.4*  CL 91* 99  CO2 23 22  GLUCOSE 600* 368*  BUN 38* 25*  CREATININE 1.44* 0.87  CALCIUM 9.8 8.8*   GFR: Estimated Creatinine Clearance: 41.6 mL/min (by C-G formula based on SCr of 0.87 mg/dL). Liver Function Tests: No results for input(s): AST, ALT, ALKPHOS, BILITOT, PROT, ALBUMIN in the last 168 hours. No results for input(s): LIPASE, AMYLASE in the last 168 hours. No results for input(s): AMMONIA in the last 168 hours. Coagulation Profile: No results for input(s): INR, PROTIME in the last 168 hours. Cardiac Enzymes: No results for input(s): CKTOTAL, CKMB, CKMBINDEX, TROPONINI in the last 168 hours. BNP (last 3 results) No results for input(s): PROBNP in the last 8760 hours. HbA1C: Recent Labs    09/01/21 0252  HGBA1C 11.8*   CBG: Recent Labs  Lab 08/31/21 2229 09/01/21 0016  09/01/21 0621 09/01/21 0809 09/01/21 1158  GLUCAP 425* 351* 124* 110* 392*   Lipid Profile: No results for input(s): CHOL, HDL, LDLCALC, TRIG, CHOLHDL, LDLDIRECT in the last 72 hours. Thyroid Function Tests: No results for input(s): TSH, T4TOTAL, FREET4, T3FREE, THYROIDAB in the last 72 hours. Anemia Panel: No results for input(s): VITAMINB12, FOLATE, FERRITIN, TIBC, IRON, RETICCTPCT in the last 72 hours. Sepsis Labs: No results for input(s): PROCALCITON, LATICACIDVEN in the last 168 hours.  Recent Results (from the past 240 hour(s))  Resp Panel by RT-PCR (Flu A&B, Covid) Nasopharyngeal Swab     Status: None   Collection Time: 08/31/21  6:24 PM   Specimen: Nasopharyngeal Swab; Nasopharyngeal(NP) swabs in vial transport medium  Result Value Ref Range Status   SARS Coronavirus 2 by RT PCR NEGATIVE NEGATIVE Final    Comment: (NOTE) SARS-CoV-2 target nucleic acids are NOT DETECTED.  The SARS-CoV-2 RNA is generally detectable in upper respiratory specimens during the  acute phase of infection. The lowest concentration of SARS-CoV-2 viral copies this assay can detect is 138 copies/mL. A negative result does not preclude SARS-Cov-2 infection and should not be used as the sole basis for treatment or other patient management decisions. A negative result may occur with  improper specimen collection/handling, submission of specimen other than nasopharyngeal swab, presence of viral mutation(s) within the areas targeted by this assay, and inadequate number of viral copies(<138 copies/mL). A negative result must be combined with clinical observations, patient history, and epidemiological information. The expected result is Negative.  Fact Sheet for Patients:  EntrepreneurPulse.com.au  Fact Sheet for Healthcare Providers:  IncredibleEmployment.be  This test is no t yet approved or cleared by the [place] FDA and  has been authorized for detection  and/or diagnosis of SARS-CoV-2 by FDA under an Emergency Use Authorization (EUA). This EUA will remain  in effect (meaning this test can be used) for the duration of the COVID-19 declaration under Section 564(b)(1) of the Act, 21 U.S.C.section 360bbb-3(b)(1), unless the authorization is terminated  or revoked sooner.       Influenza A by PCR NEGATIVE NEGATIVE Final   Influenza B by PCR NEGATIVE NEGATIVE Final    Comment: (NOTE) The Xpert Xpress SARS-CoV-2/FLU/RSV plus assay is intended as an aid in the diagnosis of influenza from Nasopharyngeal swab specimens and should not be used as a sole basis for treatment. Nasal washings and aspirates are unacceptable for Xpert Xpress SARS-CoV-2/FLU/RSV testing.  Fact Sheet for Patients: EntrepreneurPulse.com.au  Fact Sheet for Healthcare Providers: IncredibleEmployment.be  This test is not yet approved or cleared by the [place] FDA and has been authorized for detection and/or diagnosis of SARS-CoV-2 by FDA under an Emergency Use Authorization (EUA). This EUA will remain in effect (meaning this test can be used) for the duration of the COVID-19 declaration under Section 564(b)(1) of the Act, 21 U.S.C. section 360bbb-3(b)(1), unless the authorization is terminated or revoked.  Performed at [facility], [address] [place], [place] [address]   MRSA Next Gen by PCR, Nasal     Status: None   Collection Time: 09/01/21 12:04 AM   Specimen: Nasal Mucosa; Nasal Swab  Result Value Ref Range Status   MRSA by PCR Next Gen NOT DETECTED NOT DETECTED Final    Comment: (NOTE) The GeneXpert MRSA Assay (FDA approved for NASAL specimens only), is one component of a comprehensive MRSA colonization surveillance program. It is not intended to diagnose MRSA infection nor to guide or monitor treatment for MRSA infections. Test performance is not FDA approved in patients less than 38  years old. Performed at [facility], [place] [address], [place], [place] [address]      Radiology Studies: No results found.  Scheduled Meds:  apixaban  2.5 mg Oral BID   atorvastatin  20 mg Oral Daily   Chlorhexidine Gluconate Cloth  6 each Topical Daily   diltiazem  60 mg Oral Q6H   insulin aspart  0-15 Units Subcutaneous TID WC   insulin aspart  0-5 Units Subcutaneous QHS   insulin glargine-yfgn  25 Units Subcutaneous Daily   irbesartan  150 mg Oral Daily   levothyroxine  75 mcg Oral Q0600   triamterene-hydrochlorothiazide  1 tablet Oral q morning   Continuous Infusions:  diltiazem (CARDIZEM) infusion Stopped (09/01/21 1141)   lactated ringers 75 mL/hr at 09/01/21 1149     LOS: 0 days   Marylu Lund, MD [facility] Pager On Amion  If  7PM-7AM, please contact night-coverage 09/01/2021, 12:32 PM

## 2021-09-01 NOTE — H&P (Signed)
History and Physical    [name] ACZ:[id number] DOB: Feb 11, 1935 DOA: 08/31/2021  PCP: Deland Pretty, MD   Patient coming from: Home via [facility]  Chief Complaint: Not feeling well, blood sugars high  HPI: [name] is a 86 y.o. female with medical history significant for DMT2, HTN, HLD, PAF who Zentz for evaluation of not feeling well.  She reports that for the last week she has felt "off".  She states she has had generalized weakness, fatigue and is felt lightheaded at times.  She has not had any loss of consciousness and has not had any falls.  She states that she felt so bad she had to leave work today and thought that she might be hungry so she went to [employer] and got a fish sandwich but only took 2 bites and then felt even worse and developed some blurry vision.  She has not had any fevers, chills, cough, shortness of breath, urinary symptoms, nausea vomiting or diarrhea.  She was taken to the emergency room and found that her blood sugar was over 600.  She states that has been running high for the last week ever since she was changed to a insulin patch that she is supposed to wear for 3 days and then change.  She does state that she uses it for longer than the 3 days.  She pushes the buttons to deliver herself 2 units of insulin with each push on the patch but despite doing this her blood sugars continue to run high.  She states her blood sugars were not as high when she was using insulin pens previously.  Her last hemoglobin A1c was in June 2022 and was over 15.  She states she is been compliant with her medications.  ED Course: Ms. Mckee has been hemodynamically stable in the emergency room.  She did go into atrial fibrillation with RVR while in the emergency room and was started on a Cardizem infusion.  Her blood sugars were over 600 initially and came down to the 450 range with IV fluids.  She was given subcu insulin in the emergency room.  She has the patch on in her right  lower quadrant of her abdomen but there is no way to tell if it is actually delivering units of insulin with every depression of the delivery system.  He is unremarkable.  Sodium is 126 but when corrected for glucose is 134.  Potassium 4.4 chloride 91 bicarb 23 creatinine 1.44 from baseline of 0.96 BUN 38 glucose greater than 600.  Hospitalist service is asked to manage patient  Review of Systems:  General: Reports feeling lightheaded and fatigued with weakness. Denies fever, chills, weight loss, night sweats. Denies change in appetite HENT: Denies head trauma, headache, denies change in hearing, tinnitus.  Denies nasal congestion or bleeding.  Denies sore throat.  Denies difficulty swallowing Eyes: Denies blurry vision, pain in eye, drainage.  Denies discoloration of eyes. Neck: Denies pain.  Denies swelling.  Denies pain with movement. Cardiovascular: Reports palpitations. Denies chest pain.  Denies edema.  Denies orthopnea Respiratory: Denies shortness of breath, cough.  Denies wheezing.  Denies sputum production Gastrointestinal: Denies abdominal pain, swelling.  Denies nausea, vomiting, diarrhea.  Denies melena.  Denies hematemesis. Musculoskeletal: Denies limitation of movement.  Denies deformity or swelling.  Denies pain.  Denies arthralgias or myalgias. Genitourinary: Denies pelvic pain.  Denies urinary frequency or hesitancy.  Denies dysuria.  Skin: Denies rash.  Denies petechiae, purpura, ecchymosis. Neurological:Denies syncope.  Denies seizure  activity.  Denies paresthesia.  Denies slurred speech, drooping face.  Denies visual change. Psychiatric: Denies depression, anxiety. Denies hallucinations.  Past Medical History:  Diagnosis Date   Colon polyp    Diabetes mellitus    Hyperlipidemia    Hypertension    Poor appetite    Thyroid disease     Past Surgical History:  Procedure Laterality Date   BREAST EXCISIONAL BIOPSY Right    CARDIOVASCULAR STRESS TEST  07/15/2006   Normal  scan, no ECG changes   TRANSTHORACIC ECHOCARDIOGRAM  01/08/2011   EF 60-65%, moderae mitral regurg, LA mild-moderately dilated,    Social History  reports that she quit smoking about 18 years ago. Her smoking use included cigarettes. She has never used smokeless tobacco. She reports that she does not drink alcohol and does not use drugs.  No Known Allergies  Family History  Problem Relation Age of Onset   Diabetes Mother    Hypertension Father    Hyperlipidemia Father    Diabetes Sister    Hypertension Sister    Hyperlipidemia Sister    Hypertension Brother    Stroke Sister    Hypertension Sister    Diabetes Sister    Cancer Daughter    Heart failure Child    Breast cancer Neg Hx      Prior to Admission medications   Medication Sig Start Date End Date Taking? Authorizing Provider  apixaban (ELIQUIS) 2.5 MG TABS tablet Take 1 tablet (2.5 mg total) by mouth 2 (two) times daily. 12/18/20  Yes Hilty, Nadean Corwin, MD  atorvastatin (LIPITOR) 20 MG tablet Take 20 mg by mouth daily. 02/01/20  Yes [provider]  furosemide (LASIX) 20 MG tablet Take 20 mg by mouth daily as needed for edema. 03/30/21  Yes [provider]  insulin aspart (NOVOLOG FLEXPEN) 100 UNIT/ML FlexPen Inject 4-12 Units into the skin See admin instructions. Before each meal 3 times a day, 140-199 - 4 units, 200-250 - 6 units, 251-299 - 8 units,  300-349 - 12 units,  350 or above 15 units. 03/16/21  Yes Thurnell Lose, MD  insulin glargine, 2 Unit Dial, (TOUJEO MAX SOLOSTAR) 300 UNIT/ML Solostar Pen Inject 26 Units into the skin daily. 03/16/21  Yes Thurnell Lose, MD  levothyroxine (SYNTHROID) 75 MCG tablet Take 75 mcg by mouth daily before breakfast. 03/09/21  Yes [provider]  metoprolol succinate (TOPROL-XL) 50 MG 24 hr tablet TAKE 1 TABLET BY MOUTH TWICE A DAY Patient taking differently: Take 50 mg by mouth 2 (two) times daily. 02/09/21  Yes Hilty, Nadean Corwin, MD  olmesartan (BENICAR) 20 MG  tablet Take 20 mg by mouth daily. 08/24/21  Yes [provider]  TIADYLT ER 180 MG 24 hr capsule TAKE 1 CAPSULE BY MOUTH EVERY DAY Patient taking differently: Take 180 mg by mouth daily at 12 noon. 11/17/20  Yes Hilty, Nadean Corwin, MD  triamterene-hydrochlorothiazide (MAXZIDE-25) 37.5-25 MG tablet Take 1 tablet by mouth every morning. 06/26/21  Yes [provider]    Physical Exam: Vitals:   08/31/21 2300 08/31/21 2310 08/31/21 2320 09/01/21 0000  BP: 108/62 (!) 112/56 125/73   Pulse: 88 96    Resp: 20 20 19    Temp:    98.4 F (36.9 C)  TempSrc:      SpO2: 100% 100%    Weight:    61.7 kg  Height:    5\' 2"  (1.575 m)    Constitutional: NAD, calm, comfortable Vitals:   08/31/21  2300 08/31/21 2310 08/31/21 2320 09/01/21 0000  BP: 108/62 (!) 112/56 125/73   Pulse: 88 96    Resp: 20 20 19    Temp:    98.4 F (36.9 C)  TempSrc:      SpO2: 100% 100%    Weight:    61.7 kg  Height:    5\' 2"  (1.575 m)   General: WDWN, Alert and oriented x3.  Eyes: EOMI, PERRL, conjunctivae normal.  Sclera nonicteric HENT:  [place]/AT, external ears normal.  Nares patent without epistasis.  Mucous membranes are moist. Neck: Soft, normal range of motion, supple, no masses, Trachea midline Respiratory: clear to auscultation bilaterally, no wheezing, no crackles. Normal respiratory effort. No accessory muscle use.  Cardiovascular: Regular rate and rhythm, no murmurs / rubs / gallops. No extremity edema. 2+ pedal pulses.  Abdomen: Soft, no tenderness, nondistended, no rebound or guarding.  No masses palpated. No hepatosplenomegaly. Bowel sounds normoactive. Insulin patch in RLQ Musculoskeletal: FROM. no cyanosis. No joint deformity upper and lower extremities. Normal muscle tone.  Skin: Warm, dry, intact no rashes, lesions, ulcers. No induration Neurologic: CN 2-12 grossly intact.  Normal speech.  Sensation intact to touch. Strength 5/5 in all extremities.   Psychiatric: Normal judgment and insight.   Normal mood.    Labs on Admission: I have personally reviewed following labs and imaging studies  CBC: Recent Labs  Lab 08/31/21 1515  WBC 6.2  HGB 13.0  HCT 39.8  MCV 84.1  PLT 130    Basic Metabolic Panel: Recent Labs  Lab 08/31/21 1515  NA 126*  K 4.4  CL 91*  CO2 23  GLUCOSE 600*  BUN 38*  CREATININE 1.44*  CALCIUM 9.8    GFR: Estimated Creatinine Clearance: 25.1 mL/min (A) (by C-G formula based on SCr of 1.44 mg/dL (H)).  Liver Function Tests: No results for input(s): AST, ALT, ALKPHOS, BILITOT, PROT, ALBUMIN in the last 168 hours.  Urine analysis:    Component Value Date/Time   COLORURINE YELLOW 08/31/2021 1700   APPEARANCEUR CLEAR 08/31/2021 1700   LABSPEC 1.021 08/31/2021 1700   PHURINE 5.0 08/31/2021 1700   GLUCOSEU >1,000 (A) 08/31/2021 1700   HGBUR NEGATIVE 08/31/2021 1700   BILIRUBINUR NEGATIVE 08/31/2021 1700   KETONESUR 15 (A) 08/31/2021 1700   PROTEINUR NEGATIVE 08/31/2021 1700   UROBILINOGEN 0.2 03/17/2011 0847   NITRITE NEGATIVE 08/31/2021 1700   LEUKOCYTESUR NEGATIVE 08/31/2021 1700    Radiological Exams on Admission: No results found.  EKG: Independently reviewed.  EKG shows atrial fibrillation with RVR.  No acute ST elevation or depression.  QTc prolonged at 485  Assessment/Plan Principal Problem:   Atrial fibrillation with RVR Ms. Schlotterbeck is placed in stepdown unit. She is started on Cardizem infusion which will be continued overnight and weaned as tolerated.  Continue Eliquis for anticoagulation Check echocardiogram  Active Problems:   Uncontrolled type 2 diabetes mellitus with hyperglycemia  Continue Toujeo for basal insulin.  Patient has been on an insulin patch for the last week and during that time her blood sugars are continually increase she states.  Does not appear that the insulin patch is actually delivering insulin to her system and therefore will stop using it at this time and will use subcu NovoLog as needed for  glycemic control.  We will be checking blood sugars with meals and at bedtime. Check hemoglobin A1c level    AKI (acute kidney injury) IVF hydration with LR at 75 ml/hr overnight. Recheck electrolytes and renal function in  am.    Essential hypertension Conitnue ARB, metoprolol. Monitor BP.     Hypothyroidism Continue levothyroxine    Prolonged QT interval Avoid medications which could further prolong QT interval  DVT prophylaxis: Pt is on Eliquis for anticoagulation which is continued  Code Status:   Full Code  Family Communication:  Diagnosis and plan discussed with patient.  She verbalized understanding agrees with plan.  Further recommendations to follow as clinical indicated Disposition Plan:   Patient is from:  Home  Anticipated DC to:  Home  Anticipated DC date:  Anticipate less than 2 midnight stay in the hospital although this may change her depending on patient's response to treatment  Admission status:  Observation   Eben Burow MD [facility]  How to contact the [facility] Attending or Consulting provider [place] or covering provider during after hours [place], for this patient?   Check the care team in [facility] and look for a) attending/consulting [facility] provider listed and b) the [facility] team listed Log into [email or web address] and use [place]'s universal password to access. If you do not have the password, please contact the hospital operator. Locate the [facility] provider you are looking for under [facility] and page to a number that you can be directly reached. If you still have difficulty reaching the provider, please page the [facility] (Director on Call) for the Hospitalists listed on amion for assistance.  09/01/2021, 1:11 AM

## 2021-09-02 ENCOUNTER — Encounter: Payer: Self-pay | Admitting: Internal Medicine

## 2021-09-02 ENCOUNTER — Ambulatory Visit: Payer: Medicare PPO | Admitting: Internal Medicine

## 2021-09-02 DIAGNOSIS — N179 Acute kidney failure, unspecified: Secondary | ICD-10-CM | POA: Diagnosis not present

## 2021-09-02 DIAGNOSIS — I10 Essential (primary) hypertension: Secondary | ICD-10-CM | POA: Diagnosis not present

## 2021-09-02 DIAGNOSIS — E039 Hypothyroidism, unspecified: Secondary | ICD-10-CM

## 2021-09-02 DIAGNOSIS — E876 Hypokalemia: Secondary | ICD-10-CM | POA: Diagnosis not present

## 2021-09-02 DIAGNOSIS — I4891 Unspecified atrial fibrillation: Secondary | ICD-10-CM | POA: Diagnosis not present

## 2021-09-02 LAB — GLUCOSE, CAPILLARY
Glucose-Capillary: 46 mg/dL — ABNORMAL LOW (ref 70–99)
Glucose-Capillary: 52 mg/dL — ABNORMAL LOW (ref 70–99)
Glucose-Capillary: 456 mg/dL — ABNORMAL HIGH (ref 70–99)
Glucose-Capillary: 566 mg/dL (ref 70–99)
Glucose-Capillary: 408 mg/dL — ABNORMAL HIGH (ref 70–99)
Glucose-Capillary: 446 mg/dL — ABNORMAL HIGH (ref 70–99)
Glucose-Capillary: 137 mg/dL — ABNORMAL HIGH (ref 70–99)

## 2021-09-02 LAB — COMPREHENSIVE METABOLIC PANEL
ALT: 16 U/L (ref 0–44)
AST: 18 U/L (ref 15–41)
Albumin: 3.3 g/dL — ABNORMAL LOW (ref 3.5–5.0)
Alkaline Phosphatase: 66 U/L (ref 38–126)
Anion gap: 9 (ref 5–15)
BUN: 23 mg/dL (ref 8–23)
CO2: 24 mmol/L (ref 22–32)
Calcium: 8.9 mg/dL (ref 8.9–10.3)
Chloride: 99 mmol/L (ref 98–111)
Creatinine, Ser: 0.98 mg/dL (ref 0.44–1.00)
GFR, Estimated: 57 mL/min — ABNORMAL LOW (ref >60)
Glucose, Bld: 322 mg/dL — ABNORMAL HIGH (ref 70–99)
Potassium: 2.9 mmol/L — ABNORMAL LOW (ref 3.5–5.1)
Sodium: 132 mmol/L — ABNORMAL LOW (ref 135–145)
Total Bilirubin: 0.6 mg/dL (ref 0.3–1.2)
Total Protein: 6.3 g/dL — ABNORMAL LOW (ref 6.5–8.1)

## 2021-09-02 LAB — MAGNESIUM: Magnesium: 2 mg/dL (ref 1.7–2.4)

## 2021-09-02 MED ORDER — DEXTROSE 50 % IV SOLN: 50.0000 mL | INTRAVENOUS | Status: DC | PRN

## 2021-09-02 MED ORDER — DEXTROSE 50 % IV SOLN
INTRAVENOUS | Status: AC
  Administered 2021-09-02: 50 mL via INTRAVENOUS
  Filled 2021-09-02: qty 50

## 2021-09-02 MED ORDER — INSULIN GLARGINE-YFGN 100 UNIT/ML ~~LOC~~ SOLN
15.0000 [IU] | Freq: Every day | SUBCUTANEOUS | Status: DC
  Filled 2021-09-02: qty 0.15

## 2021-09-02 MED ORDER — INSULIN ASPART 100 UNIT/ML IJ SOLN
0.0000 [IU] | Freq: Three times a day (TID) | INTRAMUSCULAR | Status: DC
  Administered 2021-09-02: 15 [IU] via SUBCUTANEOUS
  Administered 2021-09-03 (×2): 11 [IU] via SUBCUTANEOUS
  Administered 2021-09-04: 8 [IU] via SUBCUTANEOUS
  Administered 2021-09-04: 3 [IU] via SUBCUTANEOUS

## 2021-09-02 MED ORDER — INSULIN ASPART 100 UNIT/ML IJ SOLN
5.0000 [IU] | Freq: Once | INTRAMUSCULAR | Status: AC
  Administered 2021-09-02: 5 [IU] via SUBCUTANEOUS

## 2021-09-02 MED ORDER — POTASSIUM CHLORIDE CRYS ER 20 MEQ PO TBCR
40.0000 meq | EXTENDED_RELEASE_TABLET | Freq: Two times a day (BID) | ORAL | Status: AC
  Administered 2021-09-02 (×2): 40 meq via ORAL
  Filled 2021-09-02 (×2): qty 2

## 2021-09-02 MED ORDER — INSULIN ASPART 100 UNIT/ML IJ SOLN: 10.0000 [IU] | Freq: Once | INTRAMUSCULAR | Status: DC

## 2021-09-02 MED ORDER — INSULIN ASPART 100 UNIT/ML IJ SOLN
15.0000 [IU] | Freq: Once | INTRAMUSCULAR | Status: AC
  Administered 2021-09-02: 15 [IU] via SUBCUTANEOUS

## 2021-09-02 MED ORDER — INSULIN GLARGINE-YFGN 100 UNIT/ML ~~LOC~~ SOLN
12.0000 [IU] | Freq: Every day | SUBCUTANEOUS | Status: DC
  Administered 2021-09-02 – 2021-09-04 (×3): 12 [IU] via SUBCUTANEOUS
  Filled 2021-09-02 (×3): qty 0.12

## 2021-09-02 MED ORDER — INSULIN ASPART 100 UNIT/ML IJ SOLN: 4.0000 [IU] | Freq: Three times a day (TID) | INTRAMUSCULAR | Status: DC

## 2021-09-02 NOTE — Hospital Course (Addendum)
[name] is a 86 y.o. female with a history of diabetes mellitus, hypertension, hyperlipidemia, paroxysmal atrial fibrillation. Patient presented secondary to hyperglycemia and found to have atrial fibrillation with RVR. Patient managed on Cardizem IV and is now rate controlled. Transitioned to oral regimen of metoprolol and Cardizem. Blood sugar uncontrolled this admission requiring frequent readjustments of insulin.

## 2021-09-02 NOTE — Assessment & Plan Note (Signed)
Baseline creatinine of about 1. Creatinine of 1.44 on admission. Resolved with IV fluids.

## 2021-09-02 NOTE — Assessment & Plan Note (Addendum)
Soft blood pressure with adjustments of antihypertensives. Discontinue Maxide. Decrease to olmesartan 10 mg daily. Continue Toprol XL and Tiadylt ER.

## 2021-09-02 NOTE — Assessment & Plan Note (Signed)
Resolved

## 2021-09-02 NOTE — Progress Notes (Signed)
PROGRESS NOTE    [name]  JTT:[id number] DOB: 1935-01-18 DOA: 08/31/2021 PCP: Deland Pretty, MD   Brief Narrative: [name] is a 86 y.o. female with a history of diabetes mellitus, hypertension, hyperlipidemia, paroxysmal atrial fibrillation. Patient presented secondary to hyperglycemia and found to have atrial fibrillation with RVR. Patient managed on Cardizem IV and is now rate controlled. Blood sugar uncontrolled this admission.   Assessment & Plan:   * Atrial fibrillation with RVR (HCC) Combination of atrial fibrillation/flutter. Patient follows with cardiology as an outpatient. Rate improved on diltiazem drip. Patient transitioned to oral diltiazem. Metoprolol continued.  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 Patient is on Toujeo as an outpatient but was recently started on an insulin pump. Blood sugar has been uncontrolled this admission with hyper- and hypoglycemia. -Decrease to Semglee 12 units -Novolog 4 units TID with meals; SSI moderate scale  Hypothyroidism -Continue Synthroid  Essential hypertension -Continue irbesartan, Cardizem -Triamterene held  Prolonged QT interval-resolved as of 09/02/2021 Resolved.  AKI (acute kidney injury) (HCC)-resolved as of 09/02/2021 Baseline creatinine of about 1. Creatinine of 1.44 on admission. Resolved with IV fluids.    DVT prophylaxis: Eliquis Code Status:   Code Status: Full Code Family Communication: None at bedside Disposition Plan: Discharge home possibly in 1-2 days pending better control of blood sugar   Consultants:  None  Procedures:  None  Antimicrobials: None    Subjective: Hypoglycemia overnight. No concerns this morning.  Objective: Vitals:   09/02/21 0200 09/02/21 0400 09/02/21 0600 09/02/21 0800   BP: (!) 137/109  (!) 104/51 (!) 102/41  Pulse: (!) 53   68  Resp: 20  (!) 23 19  Temp:  (!) 97.5 F (36.4 C)  97.9 F (36.6 C)  TempSrc:  Axillary  Oral  SpO2: 96%   97%  Weight:      Height:        Intake/Output Summary (Last 24 hours) at 09/02/2021 0913 Last data filed at 09/01/2021 1809 Gross per 24 hour  Intake 1652.51 ml  Output --  Net 1652.51 ml   Filed Weights   08/31/21 1438 09/01/21 0000  Weight: 62.4 kg 61.7 kg    Examination:  General exam: Appears calm and comfortable  Respiratory system: Clear to auscultation. Respiratory effort normal. Cardiovascular system: S1 & S2 heard, Irregular rhythm with normal rate. No murmurs, rubs, gallops or clicks. Gastrointestinal system: Abdomen is nondistended, soft and nontender. No organomegaly or masses felt. Normal bowel sounds heard. Central nervous system: Alert and oriented. No focal neurological deficits. Musculoskeletal: No edema. No calf tenderness Skin: No cyanosis. No rashes Psychiatry: Judgement and insight appear normal. Mood & affect appropriate.     Data Reviewed: I have personally reviewed following labs and imaging studies  CBC Lab Results  Component Value Date   WBC 7.4 09/01/2021   RBC 4.87 09/01/2021   HGB 13.6 09/01/2021   HCT 41.4 09/01/2021   MCV 85.0 09/01/2021   MCH 27.9 09/01/2021   PLT 245 09/01/2021   MCHC 32.9 09/01/2021   RDW 13.5 09/01/2021   LYMPHSABS 1.3 03/16/2021   MONOABS 0.5 03/16/2021   EOSABS 0.1 03/16/2021   BASOSABS 0.0 00/92/3300     Last metabolic panel Lab Results  Component Value Date   NA 132 (L) 09/02/2021   K 2.9 (L) 09/02/2021   CL 99 09/02/2021   CO2  24 09/02/2021   BUN 23 09/02/2021   CREATININE 0.98 09/02/2021   GLUCOSE 322 (H) 09/02/2021   GFRNONAA 57 (L) 09/02/2021   GFRAA 61 04/16/2020   CALCIUM 8.9 09/02/2021   PROT 6.3 (L) 09/02/2021   ALBUMIN 3.3 (L) 09/02/2021   LABGLOB 2.6 07/11/2019   AGRATIO 1.6 07/11/2019   BILITOT 0.6 09/02/2021    ALKPHOS 66 09/02/2021   AST 18 09/02/2021   ALT 16 09/02/2021   ANIONGAP 9 09/02/2021    CBG (last 3)  Recent Labs    09/02/21 0209 09/02/21 0229 09/02/21 0733  GLUCAP 46* 52* 456*     GFR: Estimated Creatinine Clearance: 36.9 mL/min (by C-G formula based on SCr of 0.98 mg/dL).  Coagulation Profile: No results for input(s): INR, PROTIME in the last 168 hours.  Recent Results (from the past 240 hour(s))  Resp Panel by RT-PCR (Flu A&B, Covid) Nasopharyngeal Swab     Status: None   Collection Time: 08/31/21  6:24 PM   Specimen: Nasopharyngeal Swab; Nasopharyngeal(NP) swabs in vial transport medium  Result Value Ref Range Status   SARS Coronavirus 2 by RT PCR NEGATIVE NEGATIVE Final    Comment: (NOTE) SARS-CoV-2 target nucleic acids are NOT DETECTED.  The SARS-CoV-2 RNA is generally detectable in upper respiratory specimens during the acute phase of infection. The lowest concentration of SARS-CoV-2 viral copies this assay can detect is 138 copies/mL. A negative result does not preclude SARS-Cov-2 infection and should not be used as the sole basis for treatment or other patient management decisions. A negative result may occur with  improper specimen collection/handling, submission of specimen other than nasopharyngeal swab, presence of viral mutation(s) within the areas targeted by this assay, and inadequate number of viral copies(<138 copies/mL). A negative result must be combined with clinical observations, patient history, and epidemiological information. The expected result is Negative.  Fact Sheet for Patients:  EntrepreneurPulse.com.au  Fact Sheet for Healthcare Providers:  IncredibleEmployment.be  This test is no t yet approved or cleared by the [place] FDA and  has been authorized for detection and/or diagnosis of SARS-CoV-2 by FDA under an Emergency Use Authorization (EUA). This EUA will remain  in effect (meaning  this test can be used) for the duration of the COVID-19 declaration under Section 564(b)(1) of the Act, 21 U.S.C.section 360bbb-3(b)(1), unless the authorization is terminated  or revoked sooner.       Influenza A by PCR NEGATIVE NEGATIVE Final   Influenza B by PCR NEGATIVE NEGATIVE Final    Comment: (NOTE) The Xpert Xpress SARS-CoV-2/FLU/RSV plus assay is intended as an aid in the diagnosis of influenza from Nasopharyngeal swab specimens and should not be used as a sole basis for treatment. Nasal washings and aspirates are unacceptable for Xpert Xpress SARS-CoV-2/FLU/RSV testing.  Fact Sheet for Patients: EntrepreneurPulse.com.au  Fact Sheet for Healthcare Providers: IncredibleEmployment.be  This test is not yet approved or cleared by the [place] FDA and has been authorized for detection and/or diagnosis of SARS-CoV-2 by FDA under an Emergency Use Authorization (EUA). This EUA will remain in effect (meaning this test can be used) for the duration of the COVID-19 declaration under Section 564(b)(1) of the Act, 21 U.S.C. section 360bbb-3(b)(1), unless the authorization is terminated or revoked.  Performed at [facility], [address] [place], [place] [address]   MRSA Next Gen by PCR, Nasal     Status: None   Collection Time: 09/01/21 12:04 AM   Specimen: Nasal Mucosa; Nasal Swab  Result  Value Ref Range Status   MRSA by PCR Next Gen NOT DETECTED NOT DETECTED Final    Comment: (NOTE) The GeneXpert MRSA Assay (FDA approved for NASAL specimens only), is one component of a comprehensive MRSA colonization surveillance program. It is not intended to diagnose MRSA infection nor to guide or monitor treatment for MRSA infections. Test performance is not FDA approved in patients less than 47 years old. Performed at [facility], [place] [address] [place], [place] [address]         Radiology  Studies: ECHOCARDIOGRAM COMPLETE  Result Date: 09/01/2021    ECHOCARDIOGRAM REPORT   Patient Name:   KHAYLEE MCEVOY Date of Exam: 09/01/2021 Medical Rec #:  [id number]    Height:       62.0 in Accession #:    [id number]   Weight:       136.0 lb Date of Birth:  [birth year]    BSA:          1.623 m Patient Age:    37 years     BP:           114/68 mmHg Patient Gender: F            HR:           94 bpm. Exam Location:  Inpatient Procedure: 2D Echo, 3D Echo, Cardiac Doppler and Color Doppler Indications:    I48.91* Unspeicified atrial fibrillation  History:        Patient has prior history of Echocardiogram examinations, most                 recent 01/20/2018. Abnormal ECG, Arrythmias:Atrial Fibrillation;                 Risk Factors:Hypertension and Dyslipidemia.  Sonographer:    Roseanna Rainbow RDCS Referring Phys: [id number] [facility]  Sonographer Comments: Technically difficult study due to poor echo windows. IMPRESSIONS  1. Left ventricular ejection fraction, by estimation, is 65 to 70%. The left ventricle has normal function. The left ventricle has no regional wall motion abnormalities. There is mild left ventricular hypertrophy. Left ventricular diastolic parameters are indeterminate.  2. Right ventricular systolic function is mildly reduced. The right ventricular size is normal. There is normal pulmonary artery systolic pressure. The estimated right ventricular systolic pressure is 06.2 mmHg.  3. Left atrial size was mildly dilated. The atrial septum bows right suggesting LA pressure overload.  4. The mitral valve is normal in structure. Trivial mitral valve regurgitation. No evidence of mitral stenosis.  5. The aortic valve is tricuspid. Aortic valve regurgitation is not visualized. Aortic valve sclerosis/calcification is present, without any evidence of aortic stenosis.  6. The inferior vena cava is normal in size with greater than 50% respiratory variability, suggesting right atrial pressure of 3 mmHg.  7. The  patient was in atrial fibrillation. FINDINGS  Left Ventricle: Left ventricular ejection fraction, by estimation, is 65 to 70%. The left ventricle has normal function. The left ventricle has no regional wall motion abnormalities. The left ventricular internal cavity size was normal in size. There is  mild left ventricular hypertrophy. Left ventricular diastolic parameters are indeterminate. Right Ventricle: The right ventricular size is normal. No increase in right ventricular wall thickness. Right ventricular systolic function is mildly reduced. There is normal pulmonary artery systolic pressure. The tricuspid regurgitant velocity is 2.68 m/s, and with an assumed right atrial pressure of 3 mmHg, the estimated right ventricular systolic pressure is 37.6 mmHg. Left Atrium:  Left atrial size was mildly dilated. Right Atrium: Right atrial size was normal in size. Pericardium: There is no evidence of pericardial effusion. Mitral Valve: The mitral valve is normal in structure. Trivial mitral valve regurgitation. No evidence of mitral valve stenosis. Tricuspid Valve: The tricuspid valve is normal in structure. Tricuspid valve regurgitation is mild. Aortic Valve: The aortic valve is tricuspid. Aortic valve regurgitation is not visualized. Aortic valve sclerosis/calcification is present, without any evidence of aortic stenosis. Pulmonic Valve: The pulmonic valve was normal in structure. Pulmonic valve regurgitation is not visualized. Aorta: The aortic root is normal in size and structure. Venous: The inferior vena cava is normal in size with greater than 50% respiratory variability, suggesting right atrial pressure of 3 mmHg. IAS/Shunts: There is right bowing of the interatrial septum, suggestive of elevated left atrial pressure. No atrial level shunt detected by color flow Doppler.  LEFT VENTRICLE PLAX 2D LVIDd:         3.40 cm LVIDs:         2.20 cm LV PW:         1.20 cm LV IVS:        1.20 cm LVOT diam:     1.90 cm     3D  Volume EF: LV SV:         57          3D EF:        75 % LV SV Index:   35          LV EDV:       115 ml LVOT Area:     2.84 cm    LV ESV:       29 ml                            LV SV:        85 ml  LV Volumes (MOD) LV vol d, MOD A2C: 48.0 ml LV vol d, MOD A4C: 53.9 ml LV vol s, MOD A2C: 12.6 ml LV vol s, MOD A4C: 18.9 ml LV SV MOD A2C:     35.4 ml LV SV MOD A4C:     53.9 ml LV SV MOD BP:      40.8 ml RIGHT VENTRICLE            IVC RV S prime:     7.80 cm/s  IVC diam: 1.60 cm TAPSE (M-mode): 1.1 cm LEFT ATRIUM           Index        RIGHT ATRIUM          Index LA diam:      3.60 cm 2.22 cm/m   RA Area:     9.16 cm LA Vol (A2C): 24.6 ml 15.16 ml/m  RA Volume:   16.00 ml 9.86 ml/m LA Vol (A4C): 46.5 ml 28.65 ml/m  AORTIC VALVE LVOT Vmax:   110.00 cm/s LVOT Vmean:  71.800 cm/s LVOT VTI:    0.201 m  AORTA Ao Root diam: 2.90 cm Ao Asc diam:  3.30 cm MITRAL VALVE               TRICUSPID VALVE MV Area (PHT): 2.53 cm    TR Peak grad:   28.7 mmHg MV Decel Time: 300 msec    TR Vmax:        268.00 cm/s MV E velocity: 77.10 cm/s  SHUNTS                            Systemic VTI:  0.20 m                            Systemic Diam: 1.90 cm Dalton McleanMD Electronically signed by Franki Monte Signature Date/Time: 09/01/2021/4:31:17 PM    Final         Scheduled Meds:  apixaban  2.5 mg Oral BID   atorvastatin  20 mg Oral Daily   Chlorhexidine Gluconate Cloth  6 each Topical Daily   diltiazem  60 mg Oral Q6H   insulin aspart  4 Units Subcutaneous TID WC   insulin glargine-yfgn  12 Units Subcutaneous Daily   irbesartan  75 mg Oral Daily   levothyroxine  75 mcg Oral Q0600   metoprolol tartrate  25 mg Oral BID   potassium chloride  40 mEq Oral BID   Continuous Infusions:   LOS: 0 days     Cordelia Poche, MD [facility] 09/02/2021, 9:13 AM  If 7PM-7AM, please contact night-coverage [email or web address]

## 2021-09-02 NOTE — Progress Notes (Signed)
Inpatient Diabetes Program Recommendations  AACE/ADA: New Consensus Statement on Inpatient Glycemic Control (2015)  Target Ranges:  Prepandial:   less than 140 mg/dL      Peak postprandial:   less than 180 mg/dL (1-2 hours)      Critically ill patients:  140 - 180 mg/dL    Latest Reference Range & Units 09/01/21 06:21 09/01/21 08:09 09/01/21 11:58 09/01/21 17:02 09/01/21 21:17  Glucose-Capillary 70 - 99 mg/dL 124 (H) 110 (H)   25 units Semglee @0936  392 (H)  15 units Novolog  325 (H)  11 units Novolog  289 (H)  3 units Novolog     Latest Reference Range & Units 09/02/21 02:09 09/02/21 02:29  Glucose-Capillary 70 - 99 mg/dL 46 (L) 52 (L)    Home DM Meds: Novolog 4-12 units TID per SSI                             Toujeo 26 units daily                                Current Orders: Semglee 25 units daily                            Novolog Moderate Correction Scale/ SSI (0-15 units) TID AC + HS       MD- Per Pharmacist at [facility] office, pt only taking 8 units Toujeo insulin Daily at home Pt received 25 units Semglee yesterday AM and had Severe Hypoglycemia at 2am--Suspect the [facility] was the cause.  --Please consider reducing the Semglee to 12 units Daily   --Please also consider: Reduce the Novolog SSi to the 0-9 unit Sensitive scale Start Novolog Meal Coverage: Novolog 4 units TID with meals Pt takes Meal Coverage at home      Spoke with Pharmacist at PCP office (Dr. Shelia Media) 11/22.  Per Pharmacist, pt coming in often for insulin adjustments, etc.  Pt was recently placed on a device called Cequr--This device holds 200 units of rapid-acting insulin (Per pharmacist pt uses Fiasp insulin in her Cequr)--The patch is filled and placed on the abdomen with an applicator and then the pt can deliver insulin with "Clicks"--each click administers 2 units of the Fiasp.  Pharmacist told me pt should be taking Toujeo 8 units Daily with an insulin pen and then 2 clicks (4 units of  Fiasp) with Breakfast, 3 clicks (6 units Fiasp) with Lunch, and 2 clicks (4 units of Fiasp) with [employer]  Per Pharmacist, pt still not able to change the Cequr device on her own and relies on the office to fill and change the device.   Recommend we d/c the Insulin patch at time of discharge and have pt take Novolog injections with Insulin Pen (can use doses pt was supposed to be taking with the patch--4 units with Breakfast/ 6 units with Lunch/ 4 units with Dinner)--May also need to give pt a Copy of the Novolog 0-9 Sensitive SSI that she can use for elevated CBGs.   Pt requesting refills of her Toujeo and Novolog insulin pens (has 1 Novolog pen at home) Novolog Insulin Pen- Order # [id number] Toujeo Insulin Pen- Order # [id number]    --Will follow patient during hospitalization--  Wyn Quaker RN, MSN, CDE Diabetes Coordinator Inpatient Glycemic Control Team Team Pager: [phone] (8a-5p)

## 2021-09-02 NOTE — Assessment & Plan Note (Addendum)
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 Patient is on Toujeo as an outpatient but was recently started on an insulin pump. Blood sugar has been uncontrolled this admission with hyper- and hypoglycemia. Blood sugar better controlled prior to discharge. Will recommend Toujeo 12 units daily and Novolog 8 units TID with meals on discharge. Outpatient follow-up.

## 2021-09-02 NOTE — Assessment & Plan Note (Addendum)
Given potassium supplementation.

## 2021-09-02 NOTE — Assessment & Plan Note (Addendum)
Combination of atrial fibrillation/flutter. Patient follows with cardiology as an outpatient. Rate improved on diltiazem drip. Patient transitioned to oral diltiazem. Metoprolol continued. Converted to NSR on 11/23. Resume home Tiadylt ER and Toprol XL.

## 2021-09-02 NOTE — Progress Notes (Signed)
Responded to patient room when HR got up to 190. Pt was diaphoretic, alert, oriented, and transferring from [facility] to bed. CBG found blood sugar in 40's. Patient given food and drink. Rechecked CBG and was in 50's. Dextrose 50 given per hypoglycemic standing orders. Will continue to monitor. HR down to 110's after thirty minutes. Changed leads and gown.

## 2021-09-02 NOTE — Assessment & Plan Note (Addendum)
Continue Synthroid °

## 2021-09-03 DIAGNOSIS — I10 Essential (primary) hypertension: Secondary | ICD-10-CM | POA: Diagnosis present

## 2021-09-03 DIAGNOSIS — Z9641 Presence of insulin pump (external) (internal): Secondary | ICD-10-CM | POA: Diagnosis present

## 2021-09-03 DIAGNOSIS — Z7901 Long term (current) use of anticoagulants: Secondary | ICD-10-CM | POA: Diagnosis not present

## 2021-09-03 DIAGNOSIS — Z87891 Personal history of nicotine dependence: Secondary | ICD-10-CM | POA: Diagnosis not present

## 2021-09-03 DIAGNOSIS — Z79890 Hormone replacement therapy (postmenopausal): Secondary | ICD-10-CM | POA: Diagnosis not present

## 2021-09-03 DIAGNOSIS — E1165 Type 2 diabetes mellitus with hyperglycemia: Secondary | ICD-10-CM | POA: Diagnosis present

## 2021-09-03 DIAGNOSIS — E876 Hypokalemia: Secondary | ICD-10-CM | POA: Diagnosis present

## 2021-09-03 DIAGNOSIS — Z8249 Family history of ischemic heart disease and other diseases of the circulatory system: Secondary | ICD-10-CM | POA: Diagnosis not present

## 2021-09-03 DIAGNOSIS — E11649 Type 2 diabetes mellitus with hypoglycemia without coma: Secondary | ICD-10-CM | POA: Diagnosis not present

## 2021-09-03 DIAGNOSIS — E785 Hyperlipidemia, unspecified: Secondary | ICD-10-CM | POA: Diagnosis present

## 2021-09-03 DIAGNOSIS — I480 Paroxysmal atrial fibrillation: Secondary | ICD-10-CM | POA: Diagnosis present

## 2021-09-03 DIAGNOSIS — I4892 Unspecified atrial flutter: Secondary | ICD-10-CM | POA: Diagnosis present

## 2021-09-03 DIAGNOSIS — I959 Hypotension, unspecified: Secondary | ICD-10-CM | POA: Diagnosis not present

## 2021-09-03 DIAGNOSIS — R739 Hyperglycemia, unspecified: Secondary | ICD-10-CM | POA: Diagnosis present

## 2021-09-03 DIAGNOSIS — Z794 Long term (current) use of insulin: Secondary | ICD-10-CM | POA: Diagnosis not present

## 2021-09-03 DIAGNOSIS — Z79899 Other long term (current) drug therapy: Secondary | ICD-10-CM | POA: Diagnosis not present

## 2021-09-03 DIAGNOSIS — Z20822 Contact with and (suspected) exposure to covid-19: Secondary | ICD-10-CM | POA: Diagnosis present

## 2021-09-03 DIAGNOSIS — I4891 Unspecified atrial fibrillation: Secondary | ICD-10-CM | POA: Diagnosis not present

## 2021-09-03 DIAGNOSIS — N179 Acute kidney failure, unspecified: Secondary | ICD-10-CM | POA: Diagnosis present

## 2021-09-03 DIAGNOSIS — E039 Hypothyroidism, unspecified: Secondary | ICD-10-CM | POA: Diagnosis present

## 2021-09-03 DIAGNOSIS — Z833 Family history of diabetes mellitus: Secondary | ICD-10-CM | POA: Diagnosis not present

## 2021-09-03 DIAGNOSIS — Z83438 Family history of other disorder of lipoprotein metabolism and other lipidemia: Secondary | ICD-10-CM | POA: Diagnosis not present

## 2021-09-03 LAB — GLUCOSE, CAPILLARY
Glucose-Capillary: 175 mg/dL — ABNORMAL HIGH (ref 70–99)
Glucose-Capillary: 198 mg/dL — ABNORMAL HIGH (ref 70–99)
Glucose-Capillary: 206 mg/dL — ABNORMAL HIGH (ref 70–99)
Glucose-Capillary: 308 mg/dL — ABNORMAL HIGH (ref 70–99)
Glucose-Capillary: 338 mg/dL — ABNORMAL HIGH (ref 70–99)
Glucose-Capillary: 75 mg/dL (ref 70–99)
Glucose-Capillary: 86 mg/dL (ref 70–99)

## 2021-09-03 LAB — BASIC METABOLIC PANEL
Anion gap: 6 (ref 5–15)
BUN: 24 mg/dL — ABNORMAL HIGH (ref 8–23)
CO2: 25 mmol/L (ref 22–32)
Calcium: 8.4 mg/dL — ABNORMAL LOW (ref 8.9–10.3)
Chloride: 102 mmol/L (ref 98–111)
Creatinine, Ser: 0.94 mg/dL (ref 0.44–1.00)
GFR, Estimated: 60 mL/min — ABNORMAL LOW (ref >60)
Glucose, Bld: 174 mg/dL — ABNORMAL HIGH (ref 70–99)
Potassium: 4.3 mmol/L (ref 3.5–5.1)
Sodium: 133 mmol/L — ABNORMAL LOW (ref 135–145)

## 2021-09-03 MED ORDER — INSULIN ASPART 100 UNIT/ML IJ SOLN
8.0000 [IU] | Freq: Three times a day (TID) | INTRAMUSCULAR | Status: DC
  Administered 2021-09-03 – 2021-09-04 (×4): 8 [IU] via SUBCUTANEOUS

## 2021-09-03 MED ORDER — IRBESARTAN 75 MG PO TABS
37.5000 mg | ORAL_TABLET | Freq: Every day | ORAL | Status: DC
  Administered 2021-09-03 – 2021-09-04 (×2): 37.5 mg via ORAL
  Filled 2021-09-03 (×2): qty 0.5

## 2021-09-03 NOTE — Progress Notes (Signed)
PROGRESS NOTE    [name]  NLG:[id number] DOB: 02/24/1935 DOA: 08/31/2021 PCP: Deland Pretty, MD   Brief Narrative: [name] is a 86 y.o. female with a history of diabetes mellitus, hypertension, hyperlipidemia, paroxysmal atrial fibrillation. Patient presented secondary to hyperglycemia and found to have atrial fibrillation with RVR. Patient managed on Cardizem IV and is now rate controlled. Transitioned to oral regimen of metoprolol and Cardizem. Blood sugar uncontrolled this admission requiring frequent readjustments of insulin.    Assessment & Plan:   * Atrial fibrillation with RVR (HCC) Combination of atrial fibrillation/flutter. Patient follows with cardiology as an outpatient. Rate improved on diltiazem drip. Patient transitioned to oral diltiazem. Metoprolol continued. Converted to NSR on 11/23. -Continue Metoprolol and Cardizem  Uncontrolled type 2 diabetes mellitus with hyperglycemia ([place]) Patient, per outpatient records, is insulin dependent with concern for autoimmune diabetes resulting in type 1 pathophysiology. Patient is on Toujeo as an outpatient but was recently started on an insulin pump. Blood sugar has been uncontrolled this admission with hyper- and hypoglycemia. Continues to have labile blood sugar. -Continue Semglee 12 units -Increase to Novolog 8 units TID with meals; SSI moderate scale  Hypothyroidism -Continue Synthroid  Essential hypertension Hypotensive overnight. Soft blood pressure this morning -Continue Cardizem -Decrease to irbesartan 37.5 mg daily -If blood pressure continues to be low, will need to discontinue irbesartan. Plan to discontinue triamterene on discharge  Hypokalemia-resolved as of 09/03/2021 Given potassium supplementation.  Prolonged QT interval-resolved as of 09/02/2021 Resolved.  AKI (acute kidney injury) (HCC)-resolved as of 09/02/2021 Baseline creatinine of about 1. Creatinine of 1.44 on admission. Resolved with IV  fluids.    DVT prophylaxis: Eliquis Code Status:   Code Status: Full Code Family Communication: None at bedside Disposition Plan: Discharge home possibly in 1 day pending better control of blood sugar; concern for discharge with such labile blood sugar (including symptomatic hypoglycemia) complicated by patient's age and that she lives at home alone. High risk for mortality/morbidity at this time secondary to hypoglycemia.   Consultants:  None  Procedures:  None  Antimicrobials: None    Subjective: No issues overnight per patient. Last night, patient with a CBG of 86 overnight and patient was given graham crackers. Now with significant hyperglycemia this morning.   Objective: Vitals:   09/03/21 0200 09/03/21 0400 09/03/21 0540 09/03/21 0821  BP: (!) 72/29 (!) 111/45 (!) 111/45   Pulse: 68 65    Resp: 17 16    Temp:  98.1 F (36.7 C)  98 F (36.7 C)  TempSrc:  Oral  Oral  SpO2: 100% 96%    Weight:      Height:        Intake/Output Summary (Last 24 hours) at 09/03/2021 0936 Last data filed at 09/02/2021 2133 Gross per 24 hour  Intake 474 ml  Output 550 ml  Net -76 ml   Filed Weights   08/31/21 1438 09/01/21 0000  Weight: 62.4 kg 61.7 kg    Examination:  General exam: Appears calm and comfortable Respiratory system: Clear to auscultation. Respiratory effort normal. Cardiovascular system: S1 & S2 heard, RRR. No murmurs, rubs, gallops or clicks. Gastrointestinal system: Abdomen is slightly distended, soft and nontender. No organomegaly or masses felt. Normal bowel sounds heard. Central nervous system: Alert and oriented. No focal neurological deficits. Musculoskeletal: No edema. No calf tenderness Skin: No cyanosis. No rashes Psychiatry: Judgement and insight appear normal. Mood & affect appropriate.     Data Reviewed: I have personally reviewed following labs  and imaging studies  CBC Lab Results  Component Value Date   WBC 7.4 09/01/2021   RBC 4.87  09/01/2021   HGB 13.6 09/01/2021   HCT 41.4 09/01/2021   MCV 85.0 09/01/2021   MCH 27.9 09/01/2021   PLT 245 09/01/2021   MCHC 32.9 09/01/2021   RDW 13.5 09/01/2021   LYMPHSABS 1.3 03/16/2021   MONOABS 0.5 03/16/2021   EOSABS 0.1 03/16/2021   BASOSABS 0.0 45/80/9983     Last metabolic panel Lab Results  Component Value Date   NA 133 (L) 09/03/2021   K 4.3 09/03/2021   CL 102 09/03/2021   CO2 25 09/03/2021   BUN 24 (H) 09/03/2021   CREATININE 0.94 09/03/2021   GLUCOSE 174 (H) 09/03/2021   GFRNONAA 60 (L) 09/03/2021   GFRAA 61 04/16/2020   CALCIUM 8.4 (L) 09/03/2021   PROT 6.3 (L) 09/02/2021   ALBUMIN 3.3 (L) 09/02/2021   LABGLOB 2.6 07/11/2019   AGRATIO 1.6 07/11/2019   BILITOT 0.6 09/02/2021   ALKPHOS 66 09/02/2021   AST 18 09/02/2021   ALT 16 09/02/2021   ANIONGAP 6 09/03/2021    CBG (last 3)  Recent Labs    09/03/21 0012 09/03/21 0319 09/03/21 0806  GLUCAP 86 198* 308*     GFR: Estimated Creatinine Clearance: 38.5 mL/min (by C-G formula based on SCr of 0.94 mg/dL).  Coagulation Profile: No results for input(s): INR, PROTIME in the last 168 hours.  Recent Results (from the past 240 hour(s))  Resp Panel by RT-PCR (Flu A&B, Covid) Nasopharyngeal Swab     Status: None   Collection Time: 08/31/21  6:24 PM   Specimen: Nasopharyngeal Swab; Nasopharyngeal(NP) swabs in vial transport medium  Result Value Ref Range Status   SARS Coronavirus 2 by RT PCR NEGATIVE NEGATIVE Final    Comment: (NOTE) SARS-CoV-2 target nucleic acids are NOT DETECTED.  The SARS-CoV-2 RNA is generally detectable in upper respiratory specimens during the acute phase of infection. The lowest concentration of SARS-CoV-2 viral copies this assay can detect is 138 copies/mL. A negative result does not preclude SARS-Cov-2 infection and should not be used as the sole basis for treatment or other patient management decisions. A negative result may occur with  improper specimen  collection/handling, submission of specimen other than nasopharyngeal swab, presence of viral mutation(s) within the areas targeted by this assay, and inadequate number of viral copies(<138 copies/mL). A negative result must be combined with clinical observations, patient history, and epidemiological information. The expected result is Negative.  Fact Sheet for Patients:  EntrepreneurPulse.com.au  Fact Sheet for Healthcare Providers:  IncredibleEmployment.be  This test is no t yet approved or cleared by the [place] FDA and  has been authorized for detection and/or diagnosis of SARS-CoV-2 by FDA under an Emergency Use Authorization (EUA). This EUA will remain  in effect (meaning this test can be used) for the duration of the COVID-19 declaration under Section 564(b)(1) of the Act, 21 U.S.C.section 360bbb-3(b)(1), unless the authorization is terminated  or revoked sooner.       Influenza A by PCR NEGATIVE NEGATIVE Final   Influenza B by PCR NEGATIVE NEGATIVE Final    Comment: (NOTE) The Xpert Xpress SARS-CoV-2/FLU/RSV plus assay is intended as an aid in the diagnosis of influenza from Nasopharyngeal swab specimens and should not be used as a sole basis for treatment. Nasal washings and aspirates are unacceptable for Xpert Xpress SARS-CoV-2/FLU/RSV testing.  Fact Sheet for Patients: EntrepreneurPulse.com.au  Fact Sheet for Healthcare Providers: IncredibleEmployment.be  This test is not yet approved or cleared by the [place] and has been authorized for detection and/or diagnosis of SARS-CoV-2 by FDA under an Emergency Use Authorization (EUA). This EUA will remain in effect (meaning this test can be used) for the duration of the COVID-19 declaration under Section 564(b)(1) of the Act, 21 U.S.C. section 360bbb-3(b)(1), unless the authorization is terminated or revoked.  Performed at [facility], [address] [place], [place] [address]   MRSA Next Gen by PCR, Nasal     Status: None   Collection Time: 09/01/21 12:04 AM   Specimen: Nasal Mucosa; Nasal Swab  Result Value Ref Range Status   MRSA by PCR Next Gen NOT DETECTED NOT DETECTED Final    Comment: (NOTE) The GeneXpert MRSA Assay (FDA approved for NASAL specimens only), is one component of a comprehensive MRSA colonization surveillance program. It is not intended to diagnose MRSA infection nor to guide or monitor treatment for MRSA infections. Test performance is not FDA approved in patients less than 36 years old. Performed at [facility], [place] [address] [place], [place] [address]         Radiology Studies: ECHOCARDIOGRAM COMPLETE  Result Date: 09/01/2021    ECHOCARDIOGRAM REPORT   Patient Name:   [name] Date of Exam: 09/01/2021 Medical Rec #:  [id number]    Height:       62.0 in Accession #:    [id number]   Weight:       136.0 lb Date of Birth:  Oct 11, 1935    BSA:          1.623 m Patient Age:    32 years     BP:           114/68 mmHg Patient Gender: F            HR:           94 bpm. Exam Location:  Inpatient Procedure: 2D Echo, 3D Echo, Cardiac Doppler and Color Doppler Indications:    I48.91* Unspeicified atrial fibrillation  History:        Patient has prior history of Echocardiogram examinations, most                 recent 01/20/2018. Abnormal ECG, Arrythmias:Atrial Fibrillation;                 Risk Factors:Hypertension and Dyslipidemia.  Sonographer:    Roseanna Rainbow RDCS Referring Phys: [id number] [facility]  Sonographer Comments: Technically difficult study due to poor echo windows. IMPRESSIONS  1. Left ventricular ejection fraction, by estimation, is 65 to 70%. The left ventricle has normal function. The left ventricle has no regional wall motion abnormalities. There is mild left ventricular hypertrophy. Left ventricular diastolic parameters are indeterminate.  2.  Right ventricular systolic function is mildly reduced. The right ventricular size is normal. There is normal pulmonary artery systolic pressure. The estimated right ventricular systolic pressure is 24.2 mmHg.  3. Left atrial size was mildly dilated. The atrial septum bows right suggesting LA pressure overload.  4. The mitral valve is normal in structure. Trivial mitral valve regurgitation. No evidence of mitral stenosis.  5. The aortic valve is tricuspid. Aortic valve regurgitation is not visualized. Aortic valve sclerosis/calcification is present, without any evidence of aortic stenosis.  6. The inferior vena cava is normal in size with greater than 50% respiratory variability, suggesting right atrial pressure of 3 mmHg.  7. The patient was in atrial  fibrillation. FINDINGS  Left Ventricle: Left ventricular ejection fraction, by estimation, is 65 to 70%. The left ventricle has normal function. The left ventricle has no regional wall motion abnormalities. The left ventricular internal cavity size was normal in size. There is  mild left ventricular hypertrophy. Left ventricular diastolic parameters are indeterminate. Right Ventricle: The right ventricular size is normal. No increase in right ventricular wall thickness. Right ventricular systolic function is mildly reduced. There is normal pulmonary artery systolic pressure. The tricuspid regurgitant velocity is 2.68 m/s, and with an assumed right atrial pressure of 3 mmHg, the estimated right ventricular systolic pressure is 41.2 mmHg. Left Atrium: Left atrial size was mildly dilated. Right Atrium: Right atrial size was normal in size. Pericardium: There is no evidence of pericardial effusion. Mitral Valve: The mitral valve is normal in structure. Trivial mitral valve regurgitation. No evidence of mitral valve stenosis. Tricuspid Valve: The tricuspid valve is normal in structure. Tricuspid valve regurgitation is mild. Aortic Valve: The aortic valve is tricuspid.  Aortic valve regurgitation is not visualized. Aortic valve sclerosis/calcification is present, without any evidence of aortic stenosis. Pulmonic Valve: The pulmonic valve was normal in structure. Pulmonic valve regurgitation is not visualized. Aorta: The aortic root is normal in size and structure. Venous: The inferior vena cava is normal in size with greater than 50% respiratory variability, suggesting right atrial pressure of 3 mmHg. IAS/Shunts: There is right bowing of the interatrial septum, suggestive of elevated left atrial pressure. No atrial level shunt detected by color flow Doppler.  LEFT VENTRICLE PLAX 2D LVIDd:         3.40 cm LVIDs:         2.20 cm LV PW:         1.20 cm LV IVS:        1.20 cm LVOT diam:     1.90 cm     3D Volume EF: LV SV:         57          3D EF:        75 % LV SV Index:   35          LV EDV:       115 ml LVOT Area:     2.84 cm    LV ESV:       29 ml                            LV SV:        85 ml  LV Volumes (MOD) LV vol d, MOD A2C: 48.0 ml LV vol d, MOD A4C: 53.9 ml LV vol s, MOD A2C: 12.6 ml LV vol s, MOD A4C: 18.9 ml LV SV MOD A2C:     35.4 ml LV SV MOD A4C:     53.9 ml LV SV MOD BP:      40.8 ml RIGHT VENTRICLE            IVC RV S prime:     7.80 cm/s  IVC diam: 1.60 cm TAPSE (M-mode): 1.1 cm LEFT ATRIUM           Index        RIGHT ATRIUM          Index LA diam:      3.60 cm 2.22 cm/m   RA Area:     9.16 cm LA Vol (A2C): 24.6 ml 15.16 ml/m  RA Volume:  16.00 ml 9.86 ml/m LA Vol (A4C): 46.5 ml 28.65 ml/m  AORTIC VALVE LVOT Vmax:   110.00 cm/s LVOT Vmean:  71.800 cm/s LVOT VTI:    0.201 m  AORTA Ao Root diam: 2.90 cm Ao Asc diam:  3.30 cm MITRAL VALVE               TRICUSPID VALVE MV Area (PHT): 2.53 cm    TR Peak grad:   28.7 mmHg MV Decel Time: 300 msec    TR Vmax:        268.00 cm/s MV E velocity: 77.10 cm/s                            SHUNTS                            Systemic VTI:  0.20 m                            Systemic Diam: 1.90 cm Dalton McleanMD Electronically  signed by Franki Monte Signature Date/Time: 09/01/2021/4:31:17 PM    Final         Scheduled Meds:  apixaban  2.5 mg Oral BID   atorvastatin  20 mg Oral Daily   Chlorhexidine Gluconate Cloth  6 each Topical Daily   diltiazem  60 mg Oral Q6H   insulin aspart  0-15 Units Subcutaneous TID WC   insulin aspart  8 Units Subcutaneous TID WC   insulin glargine-yfgn  12 Units Subcutaneous Daily   irbesartan  37.5 mg Oral Daily   levothyroxine  75 mcg Oral Q0600   metoprolol tartrate  25 mg Oral BID   Continuous Infusions:   LOS: 0 days     Cordelia Poche, MD [facility] 09/03/2021, 9:36 AM  If 7PM-7AM, please contact night-coverage [email or web address]

## 2021-09-04 DIAGNOSIS — I4891 Unspecified atrial fibrillation: Secondary | ICD-10-CM | POA: Diagnosis not present

## 2021-09-04 LAB — GLUCOSE, CAPILLARY
Glucose-Capillary: 152 mg/dL — ABNORMAL HIGH (ref 70–99)
Glucose-Capillary: 198 mg/dL — ABNORMAL HIGH (ref 70–99)
Glucose-Capillary: 287 mg/dL — ABNORMAL HIGH (ref 70–99)

## 2021-09-04 MED ORDER — NOVOLOG FLEXPEN 100 UNIT/ML ~~LOC~~ SOPN: 8.0000 [IU] | PEN_INJECTOR | Freq: Three times a day (TID) | SUBCUTANEOUS | 0 refills | Status: DC

## 2021-09-04 MED ORDER — OLMESARTAN MEDOXOMIL 20 MG PO TABS: 10.0000 mg | ORAL_TABLET | Freq: Every day | ORAL | Status: DC

## 2021-09-04 MED ORDER — TOUJEO MAX SOLOSTAR 300 UNIT/ML ~~LOC~~ SOPN: 12.0000 [IU] | PEN_INJECTOR | Freq: Every day | SUBCUTANEOUS | 0 refills | Status: DC

## 2021-09-04 NOTE — Progress Notes (Signed)
Patient was transferred to 1401 via bed accompanied by myself and NT. All belongs was moved with the patient. Resident was in no acute distress prior to her transferred Report called to Frederik Pear, RN

## 2021-09-04 NOTE — Care Management Important Message (Signed)
Important Message  Patient Details IM Letter given to the Patient. Name: [name] MRN: [id number] Date of Birth: 08-23-1935   Medicare Important Message Given:  Yes     Kerin Salen 09/04/2021, 2:22 PM

## 2021-09-04 NOTE — Evaluation (Signed)
Physical Therapy One Time Evaluation Patient Details Name: [name] MRN: [id number] DOB: August 04, 1935 Today's Date: 09/04/2021  History of Present Illness  86 y.o. female with a history of diabetes mellitus, hypertension, hyperlipidemia, paroxysmal atrial fibrillation. Patient presented secondary to hyperglycemia and found to have atrial fibrillation with RVR  Clinical Impression  Patient evaluated by Physical Therapy with no further acute PT needs identified. All education has been completed and the patient has no further questions. See below for any follow-up Physical Therapy or equipment needs. PT is signing off. Thank you for this referral.      Recommendations for follow up therapy are one component of a multi-disciplinary discharge planning process, led by the attending physician.  Recommendations may be updated based on patient status, additional functional criteria and insurance authorization.  Follow Up Recommendations No PT follow up    Assistance Recommended at Discharge None  Functional Status Assessment Patient has not had a recent decline in their functional status  Equipment Recommendations  None recommended by PT    Recommendations for Other Services       Precautions / Restrictions Precautions Precautions: None      Mobility  Bed Mobility Overal bed mobility: Independent                  Transfers Overall transfer level: Independent                      Ambulation/Gait Ambulation/Gait assistance: Modified independent (Device/Increase time) Gait Distance (Feet): 400 Feet Assistive device: None Gait Pattern/deviations: WFL(Within Functional Limits)       General Gait Details: slow pace but otherise WFL, HR 82 bpm NSR during ambulation  Stairs            Wheelchair Mobility    Modified Rankin (Stroke Patients Only)       Balance Overall balance assessment: No apparent balance deficits (not formally assessed)                                            Pertinent Vitals/Pain Pain Assessment: No/denies pain    Home Living Family/patient expects to be discharged to:: Private residence Living Arrangements: Alone Available Help at Discharge: Family;Available PRN/intermittently Type of Home: House Home Access: Stairs to enter Entrance Stairs-Rails: None Entrance Stairs-Number of Steps: 1   Home Layout: Laundry or work area in basement;Able to live on main level with bedroom/bathroom Home Equipment: Conservation officer, nature (2 wheels)      Prior Function Prior Level of Function : Independent/Modified Independent                     Journalist, newspaper        Extremity/Trunk Assessment   Upper Extremity Assessment Upper Extremity Assessment: Overall WFL for tasks assessed    Lower Extremity Assessment Lower Extremity Assessment: Overall WFL for tasks assessed    Cervical / Trunk Assessment Cervical / Trunk Assessment: Normal  Communication   Communication: No difficulties  Cognition Arousal/Alertness: Awake/alert Behavior During Therapy: WFL for tasks assessed/performed Overall Cognitive Status: Within Functional Limits for tasks assessed                                          General Comments  Exercises     Assessment/Plan    PT Assessment Patient does not need any further PT services  PT Problem List         PT Treatment Interventions      PT Goals (Current goals can be found in the Care Plan section)  Acute Rehab PT Goals PT Goal Formulation: All assessment and education complete, DC therapy    Frequency     Barriers to discharge        Co-evaluation               AM-PAC PT "6 Clicks" Mobility  Outcome Measure Help needed turning from your back to your side while in a flat bed without using bedrails?: None Help needed moving from lying on your back to sitting on the side of a flat bed without using bedrails?: None Help needed  moving to and from a bed to a chair (including a wheelchair)?: None Help needed standing up from a chair using your arms (e.g., wheelchair or bedside chair)?: None Help needed to walk in hospital room?: None Help needed climbing 3-5 steps with a railing? : None 6 Click Score: 24    End of Session   Activity Tolerance: Patient tolerated treatment well Patient left: in bed;with call bell/phone within reach   PT Visit Diagnosis: Difficulty in walking, not elsewhere classified (R26.2)    Time: 0277-4128 PT Time Calculation (min) (ACUTE ONLY): 15 min   Charges:   PT Evaluation $PT Eval Low Complexity: 1 Low        Kati PT, DPT Acute Rehabilitation Services Pager: [phone] Office: [place] 09/04/2021, 12:23 PM

## 2021-09-04 NOTE — Progress Notes (Signed)
AVS given to patient and explained at the bedside. Medications and follow up appointments have been explained with pt verbalizing understanding.  

## 2021-09-04 NOTE — Discharge Summary (Signed)
Physician Discharge Summary  Ceili Boshers EXN:[id number] DOB: Aug 29, 1935 DOA: 08/31/2021  PCP: Deland Pretty, MD  Admit date: 08/31/2021 Discharge date: 09/04/2021  Admitted From: Home Disposition: Home  Recommendations for Outpatient Follow-up:  Follow up with PCP in 1 week Please obtain BMP/CBC in one week Please follow up on the following pending results: None  Home Health: None Equipment/Devices: None  Discharge Condition: Stable CODE STATUS: Full code Diet recommendation: Heart healthy/carb modified   Brief/Interim Summary:  Admission HPI written by Rise Patience, MD   HPI: Curtina Grills is a 86 y.o. female with medical history significant for DMT2, HTN, HLD, PAF who [place] for evaluation of not feeling well.  She reports that for the last week she has felt "off".  She states she has had generalized weakness, fatigue and is felt lightheaded at times.  She has not had any loss of consciousness and has not had any falls.  She states that she felt so bad she had to leave work today and thought that she might be hungry so she went to [employer] and got a fish sandwich but only took 2 bites and then felt even worse and developed some blurry vision.  She has not had any fevers, chills, cough, shortness of breath, urinary symptoms, nausea vomiting or diarrhea.  She was taken to the emergency room and found that her blood sugar was over 600.  She states that has been running high for the last week ever since she was changed to a insulin patch that she is supposed to wear for 3 days and then change.  She does state that she uses it for longer than the 3 days.  She pushes the buttons to deliver herself 2 units of insulin with each push on the patch but despite doing this her blood sugars continue to run high.  She states her blood sugars were not as high when she was using insulin pens previously.  Her last hemoglobin A1c was in June 2022 and was over 15.  She states she is been  compliant with her medications.  Hospital course:  * Atrial fibrillation with RVR (HCC) Combination of atrial fibrillation/flutter. Patient follows with cardiology as an outpatient. Rate improved on diltiazem drip. Patient transitioned to oral diltiazem. Metoprolol continued. Converted to NSR on 11/23. Resume home Tiadylt ER and Toprol XL.  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 Patient is on Toujeo as an outpatient but was recently started on an insulin pump. Blood sugar has been uncontrolled this admission with hyper- and hypoglycemia. Blood sugar better controlled prior to discharge. Will recommend Toujeo 12 units daily and Novolog 8 units TID with meals on discharge. Outpatient follow-up.  Hypothyroidism Continue Synthroid  Essential hypertension Soft blood pressure with adjustments of antihypertensives. Discontinue Maxide. Decrease to olmesartan 10 mg daily. Continue Toprol XL and Tiadylt ER.  Hypokalemia-resolved as of 09/03/2021 Given potassium supplementation.  Prolonged QT interval-resolved as of 09/02/2021 Resolved.  AKI (acute kidney injury) (HCC)-resolved as of 09/02/2021 Baseline creatinine of about 1. Creatinine of 1.44 on admission. Resolved with IV fluids.    Discharge Diagnoses:  Principal Problem:   Atrial fibrillation with RVR ([place]) Active Problems:   Essential hypertension   Hypothyroidism   Uncontrolled type 2 diabetes mellitus with hyperglycemia [facility])    Discharge Instructions  Discharge Instructions     Amb referral to [facility]   Complete  by: As directed       Allergies as of 09/04/2021   No Known Allergies      Medication List     STOP taking these medications    triamterene-hydrochlorothiazide 37.5-25 MG tablet Commonly known as: MAXZIDE-25       TAKE these medications     apixaban 2.5 MG Tabs tablet Commonly known as: Eliquis Take 1 tablet (2.5 mg total) by mouth 2 (two) times daily.   atorvastatin 20 MG tablet Commonly known as: LIPITOR Take 20 mg by mouth daily.   furosemide 20 MG tablet Commonly known as: LASIX Take 20 mg by mouth daily as needed for edema.   levothyroxine 75 MCG tablet Commonly known as: SYNTHROID Take 75 mcg by mouth daily before breakfast.   metoprolol succinate 50 MG 24 hr tablet Commonly known as: TOPROL-XL TAKE 1 TABLET BY MOUTH TWICE A DAY   NovoLOG FlexPen 100 UNIT/ML FlexPen Generic drug: insulin aspart Inject 8 Units into the skin 3 (three) times daily with meals. What changed:  how much to take when to take this additional instructions   olmesartan 20 MG tablet Commonly known as: BENICAR Take 0.5 tablets (10 mg total) by mouth daily. What changed: how much to take   Tiadylt ER 180 MG 24 hr capsule Generic drug: diltiazem TAKE 1 CAPSULE BY MOUTH EVERY DAY What changed:  how much to take when to take this   Toujeo Max SoloStar 300 UNIT/ML Solostar Pen Generic drug: insulin glargine (2 Unit Dial) Inject 12 Units into the skin daily. What changed: how much to take        Follow-up Information     Deland Pretty, MD. Schedule an appointment as soon as possible for a visit in 1 week(s).   Specialty: Internal Medicine Why: For hospital follow-up Contact information: [address] [place] [address] [phone]                No Known Allergies  Consultations: None   Procedures/Studies: ECHOCARDIOGRAM COMPLETE  Result Date: 09/01/2021    ECHOCARDIOGRAM REPORT   Patient Name:   KAIYA BOATMAN Date of Exam: 09/01/2021 Medical Rec #:  [id number]    Height:       62.0 in Accession #:    [id number]   Weight:       136.0 lb Date of Birth:  1935/09/16    BSA:          1.623 m Patient Age:    86 years     BP:           114/68 mmHg Patient Gender: F            HR:            94 bpm. Exam Location:  Inpatient Procedure: 2D Echo, 3D Echo, Cardiac Doppler and Color Doppler Indications:    I48.91* Unspeicified atrial fibrillation  History:        Patient has prior history of Echocardiogram examinations, most                 recent 01/20/2018. Abnormal ECG, Arrythmias:Atrial Fibrillation;                 Risk Factors:Hypertension and Dyslipidemia.  Sonographer:    Roseanna Rainbow RDCS Referring Phys: [id number] [facility]  Sonographer Comments: Technically difficult study due to poor echo windows. IMPRESSIONS  1. Left ventricular ejection fraction, by estimation, is 65 to 70%. The left ventricle has normal function.  The left ventricle has no regional wall motion abnormalities. There is mild left ventricular hypertrophy. Left ventricular diastolic parameters are indeterminate.  2. Right ventricular systolic function is mildly reduced. The right ventricular size is normal. There is normal pulmonary artery systolic pressure. The estimated right ventricular systolic pressure is 09.6 mmHg.  3. Left atrial size was mildly dilated. The atrial septum bows right suggesting LA pressure overload.  4. The mitral valve is normal in structure. Trivial mitral valve regurgitation. No evidence of mitral stenosis.  5. The aortic valve is tricuspid. Aortic valve regurgitation is not visualized. Aortic valve sclerosis/calcification is present, without any evidence of aortic stenosis.  6. The inferior vena cava is normal in size with greater than 50% respiratory variability, suggesting right atrial pressure of 3 mmHg.  7. The patient was in atrial fibrillation. FINDINGS  Left Ventricle: Left ventricular ejection fraction, by estimation, is 65 to 70%. The left ventricle has normal function. The left ventricle has no regional wall motion abnormalities. The left ventricular internal cavity size was normal in size. There is  mild left ventricular hypertrophy. Left ventricular diastolic parameters are indeterminate.  Right Ventricle: The right ventricular size is normal. No increase in right ventricular wall thickness. Right ventricular systolic function is mildly reduced. There is normal pulmonary artery systolic pressure. The tricuspid regurgitant velocity is 2.68 m/s, and with an assumed right atrial pressure of 3 mmHg, the estimated right ventricular systolic pressure is 04.5 mmHg. Left Atrium: Left atrial size was mildly dilated. Right Atrium: Right atrial size was normal in size. Pericardium: There is no evidence of pericardial effusion. Mitral Valve: The mitral valve is normal in structure. Trivial mitral valve regurgitation. No evidence of mitral valve stenosis. Tricuspid Valve: The tricuspid valve is normal in structure. Tricuspid valve regurgitation is mild. Aortic Valve: The aortic valve is tricuspid. Aortic valve regurgitation is not visualized. Aortic valve sclerosis/calcification is present, without any evidence of aortic stenosis. Pulmonic Valve: The pulmonic valve was normal in structure. Pulmonic valve regurgitation is not visualized. Aorta: The aortic root is normal in size and structure. Venous: The inferior vena cava is normal in size with greater than 50% respiratory variability, suggesting right atrial pressure of 3 mmHg. IAS/Shunts: There is right bowing of the interatrial septum, suggestive of elevated left atrial pressure. No atrial level shunt detected by color flow Doppler.  LEFT VENTRICLE PLAX 2D LVIDd:         3.40 cm LVIDs:         2.20 cm LV PW:         1.20 cm LV IVS:        1.20 cm LVOT diam:     1.90 cm     3D Volume EF: LV SV:         57          3D EF:        75 % LV SV Index:   35          LV EDV:       115 ml LVOT Area:     2.84 cm    LV ESV:       29 ml                            LV SV:        85 ml  LV Volumes (MOD) LV vol d, MOD A2C: 48.0 ml LV vol d, MOD A4C: 53.9  ml LV vol s, MOD A2C: 12.6 ml LV vol s, MOD A4C: 18.9 ml LV SV MOD A2C:     35.4 ml LV SV MOD A4C:     53.9 ml LV SV MOD BP:       40.8 ml RIGHT VENTRICLE            IVC RV S prime:     7.80 cm/s  IVC diam: 1.60 cm TAPSE (M-mode): 1.1 cm LEFT ATRIUM           Index        RIGHT ATRIUM          Index LA diam:      3.60 cm 2.22 cm/m   RA Area:     9.16 cm LA Vol (A2C): 24.6 ml 15.16 ml/m  RA Volume:   16.00 ml 9.86 ml/m LA Vol (A4C): 46.5 ml 28.65 ml/m  AORTIC VALVE LVOT Vmax:   110.00 cm/s LVOT Vmean:  71.800 cm/s LVOT VTI:    0.201 m  AORTA Ao Root diam: 2.90 cm Ao Asc diam:  3.30 cm MITRAL VALVE               TRICUSPID VALVE MV Area (PHT): 2.53 cm    TR Peak grad:   28.7 mmHg MV Decel Time: 300 msec    TR Vmax:        268.00 cm/s MV E velocity: 77.10 cm/s                            SHUNTS                            Systemic VTI:  0.20 m                            Systemic Diam: 1.90 cm Dalton McleanMD Electronically signed by Franki Monte Signature Date/Time: 09/01/2021/4:31:17 PM    Final       Subjective: No issues overnight  Discharge Exam: Vitals:   09/04/21 0225 09/04/21 0628  BP: 116/86 (!) 119/51  Pulse: 62 63  Resp: 17 17  Temp: 98 F (36.7 C) 97.7 F (36.5 C)  SpO2: (!) 86% 99%   Vitals:   09/03/21 2149 09/03/21 2339 09/04/21 0225 09/04/21 0628  BP: (!) 139/53 97/74 116/86 (!) 119/51  Pulse: 63 62 62 63  Resp:   17 17  Temp:   98 F (36.7 C) 97.7 F (36.5 C)  TempSrc:   Oral Oral  SpO2:   (!) 86% 99%  Weight:      Height:        General: Pt is alert, awake, not in acute distress Cardiovascular: RRR, S1/S2 +, no rubs, no gallops Respiratory: CTA bilaterally, no wheezing, no rhonchi Abdominal: Soft, NT, ND, bowel sounds + Extremities: no edema, no cyanosis    The results of significant diagnostics from this hospitalization (including imaging, microbiology, ancillary and laboratory) are listed below for reference.     Microbiology: Recent Results (from the past 240 hour(s))  Resp Panel by RT-PCR (Flu A&B, Covid) Nasopharyngeal Swab     Status: None   Collection Time: 08/31/21   6:24 PM   Specimen: Nasopharyngeal Swab; Nasopharyngeal(NP) swabs in vial transport medium  Result Value Ref Range Status   SARS Coronavirus 2 by RT PCR NEGATIVE NEGATIVE Final    Comment: (  NOTE) SARS-CoV-2 target nucleic acids are NOT DETECTED.  The SARS-CoV-2 RNA is generally detectable in upper respiratory specimens during the acute phase of infection. The lowest concentration of SARS-CoV-2 viral copies this assay can detect is 138 copies/mL. A negative result does not preclude SARS-Cov-2 infection and should not be used as the sole basis for treatment or other patient management decisions. A negative result may occur with  improper specimen collection/handling, submission of specimen other than nasopharyngeal swab, presence of viral mutation(s) within the areas targeted by this assay, and inadequate number of viral copies(<138 copies/mL). A negative result must be combined with clinical observations, patient history, and epidemiological information. The expected result is Negative.  Fact Sheet for Patients:  EntrepreneurPulse.com.au  Fact Sheet for Healthcare Providers:  IncredibleEmployment.be  This test is no t yet approved or cleared by the [place] FDA and  has been authorized for detection and/or diagnosis of SARS-CoV-2 by FDA under an Emergency Use Authorization (EUA). This EUA will remain  in effect (meaning this test can be used) for the duration of the COVID-19 declaration under Section 564(b)(1) of the Act, 21 U.S.C.section 360bbb-3(b)(1), unless the authorization is terminated  or revoked sooner.       Influenza A by PCR NEGATIVE NEGATIVE Final   Influenza B by PCR NEGATIVE NEGATIVE Final    Comment: (NOTE) The Xpert Xpress SARS-CoV-2/FLU/RSV plus assay is intended as an aid in the diagnosis of influenza from Nasopharyngeal swab specimens and should not be used as a sole basis for treatment. Nasal washings and aspirates  are unacceptable for Xpert Xpress SARS-CoV-2/FLU/RSV testing.  Fact Sheet for Patients: EntrepreneurPulse.com.au  Fact Sheet for Healthcare Providers: IncredibleEmployment.be  This test is not yet approved or cleared by the [place] FDA and has been authorized for detection and/or diagnosis of SARS-CoV-2 by FDA under an Emergency Use Authorization (EUA). This EUA will remain in effect (meaning this test can be used) for the duration of the COVID-19 declaration under Section 564(b)(1) of the Act, 21 U.S.C. section 360bbb-3(b)(1), unless the authorization is terminated or revoked.  Performed at [facility], [address], [place], [place] [address]   MRSA Next Gen by PCR, Nasal     Status: None   Collection Time: 09/01/21 12:04 AM   Specimen: Nasal Mucosa; Nasal Swab  Result Value Ref Range Status   MRSA by PCR Next Gen NOT DETECTED NOT DETECTED Final    Comment: (NOTE) The GeneXpert MRSA Assay (FDA approved for NASAL specimens only), is one component of a comprehensive MRSA colonization surveillance program. It is not intended to diagnose MRSA infection nor to guide or monitor treatment for MRSA infections. Test performance is not FDA approved in patients less than 84 years old. Performed at [facility], [place] [address], [place], [place] [address]      Labs: BNP (last 3 results) Recent Labs    03/15/21 0745 03/16/21 0039  BNP 182.2* 47.4   Basic Metabolic Panel: Recent Labs  Lab 08/31/21 1515 09/01/21 0252 09/02/21 0302 09/03/21 0258  NA 126* 131* 132* 133*  K 4.4 3.4* 2.9* 4.3  CL 91* 99 99 102  CO2 23 22 24 25   GLUCOSE 600* 368* 322* 174*  BUN 38* 25* 23 24*  CREATININE 1.44* 0.87 0.98 0.94  CALCIUM 9.8 8.8* 8.9 8.4*  MG  --   --  2.0  --    Liver Function Tests: Recent Labs  Lab 09/02/21 0302  AST 18  ALT 16  ALKPHOS 66  BILITOT 0.6  PROT 6.3*  ALBUMIN 3.3*   No  results for input(s): LIPASE, AMYLASE in the last 168 hours. No results for input(s): AMMONIA in the last 168 hours. CBC: Recent Labs  Lab 08/31/21 1515 09/01/21 0252  WBC 6.2 7.4  HGB 13.0 13.6  HCT 39.8 41.4  MCV 84.1 85.0  PLT 275 245   Cardiac Enzymes: No results for input(s): CKTOTAL, CKMB, CKMBINDEX, TROPONINI in the last 168 hours. BNP: Invalid input(s): POCBNP CBG: Recent Labs  Lab 09/03/21 2124 09/03/21 2234 09/04/21 0258 09/04/21 0726 09/04/21 1149  GLUCAP 206* 175* 152* 198* 287*   D-Dimer No results for input(s): DDIMER in the last 72 hours. Hgb A1c No results for input(s): HGBA1C in the last 72 hours. Lipid Profile No results for input(s): CHOL, HDL, LDLCALC, TRIG, CHOLHDL, LDLDIRECT in the last 72 hours. Thyroid function studies No results for input(s): TSH, T4TOTAL, T3FREE, THYROIDAB in the last 72 hours.  Invalid input(s): FREET3 Anemia work up No results for input(s): VITAMINB12, FOLATE, FERRITIN, TIBC, IRON, RETICCTPCT in the last 72 hours. Urinalysis    Component Value Date/Time   COLORURINE YELLOW 08/31/2021 1700   APPEARANCEUR CLEAR 08/31/2021 1700   LABSPEC 1.021 08/31/2021 1700   PHURINE 5.0 08/31/2021 1700   GLUCOSEU >1,000 (A) 08/31/2021 1700   HGBUR NEGATIVE 08/31/2021 1700   BILIRUBINUR NEGATIVE 08/31/2021 1700   KETONESUR 15 (A) 08/31/2021 1700   PROTEINUR NEGATIVE 08/31/2021 1700   UROBILINOGEN 0.2 03/17/2011 0847   NITRITE NEGATIVE 08/31/2021 1700   LEUKOCYTESUR NEGATIVE 08/31/2021 1700   Sepsis Labs Invalid input(s): PROCALCITONIN,  WBC,  LACTICIDVEN Microbiology Recent Results (from the past 240 hour(s))  Resp Panel by RT-PCR (Flu A&B, Covid) Nasopharyngeal Swab     Status: None   Collection Time: 08/31/21  6:24 PM   Specimen: Nasopharyngeal Swab; Nasopharyngeal(NP) swabs in vial transport medium  Result Value Ref Range Status   SARS Coronavirus 2 by RT PCR NEGATIVE NEGATIVE Final    Comment: (NOTE) SARS-CoV-2 target  nucleic acids are NOT DETECTED.  The SARS-CoV-2 RNA is generally detectable in upper respiratory specimens during the acute phase of infection. The lowest concentration of SARS-CoV-2 viral copies this assay can detect is 138 copies/mL. A negative result does not preclude SARS-Cov-2 infection and should not be used as the sole basis for treatment or other patient management decisions. A negative result may occur with  improper specimen collection/handling, submission of specimen other than nasopharyngeal swab, presence of viral mutation(s) within the areas targeted by this assay, and inadequate number of viral copies(<138 copies/mL). A negative result must be combined with clinical observations, patient history, and epidemiological information. The expected result is Negative.  Fact Sheet for Patients:  EntrepreneurPulse.com.au  Fact Sheet for Healthcare Providers:  IncredibleEmployment.be  This test is no t yet approved or cleared by the [place] FDA and  has been authorized for detection and/or diagnosis of SARS-CoV-2 by FDA under an Emergency Use Authorization (EUA). This EUA will remain  in effect (meaning this test can be used) for the duration of the COVID-19 declaration under Section 564(b)(1) of the Act, 21 U.S.C.section 360bbb-3(b)(1), unless the authorization is terminated  or revoked sooner.       Influenza A by PCR NEGATIVE NEGATIVE Final   Influenza B by PCR NEGATIVE NEGATIVE Final    Comment: (NOTE) The Xpert Xpress SARS-CoV-2/FLU/RSV plus assay is intended as an aid in the diagnosis of influenza from Nasopharyngeal swab specimens and should not be used  as a sole basis for treatment. Nasal washings and aspirates are unacceptable for Xpert Xpress SARS-CoV-2/FLU/RSV testing.  Fact Sheet for Patients: EntrepreneurPulse.com.au  Fact Sheet for Healthcare  Providers: IncredibleEmployment.be  This test is not yet approved or cleared by the [place] FDA and has been authorized for detection and/or diagnosis of SARS-CoV-2 by FDA under an Emergency Use Authorization (EUA). This EUA will remain in effect (meaning this test can be used) for the duration of the COVID-19 declaration under Section 564(b)(1) of the Act, 21 U.S.C. section 360bbb-3(b)(1), unless the authorization is terminated or revoked.  Performed at [facility], [address] [place], [place] [address]   MRSA Next Gen by PCR, Nasal     Status: None   Collection Time: 09/01/21 12:04 AM   Specimen: Nasal Mucosa; Nasal Swab  Result Value Ref Range Status   MRSA by PCR Next Gen NOT DETECTED NOT DETECTED Final    Comment: (NOTE) The GeneXpert MRSA Assay (FDA approved for NASAL specimens only), is one component of a comprehensive MRSA colonization surveillance program. It is not intended to diagnose MRSA infection nor to guide or monitor treatment for MRSA infections. Test performance is not FDA approved in patients less than 79 years old. Performed at [facility], [place] [address], [place], [place] [address]      Time coordinating discharge: 35 minutes  SIGNED:   Cordelia Poche, MD [facility] 09/04/2021, 1:07 PM

## 2021-09-04 NOTE — TOC Benefit Eligibility Note (Signed)
Transition of Care [facility]) Benefit Eligibility Note    Patient Details  Name: Felma Pfefferle MRN: [id number] Date of Birth: 09-Oct-1935      Covered?: Yes  Tier: 2 Drug  Prescription Coverage Preferred Pharmacy: local  Spoke with Person/Company/Phone Number:: [facility] [phone]  Co-Pay: Vela Prose, Novolog are all $40.00 Humalog is $64.00 and requires Step Therapy and prior auth [phone]  Prior Approval:  (Humolog requires Step Therapy and Prior auth [phone])  Deductible: Met       Kerin Salen Phone Number: 09/04/2021, 1:04 PM

## 2021-09-04 NOTE — TOC Transition Note (Addendum)
Transition of Care [facility]) - CM/SW Discharge Note   Patient Details  Name: [name] MRN: [id number] Date of Birth: Jan 04, 1935  Transition of Care [facility]) CM/SW Contact:  Dessa Phi, RN Phone Number: 09/04/2021, 10:21 AM   Clinical Narrative: Received benefit check for insulins-await response;noted PT ordered-await recc. Prior d/c,noted d/c order already placed.   1p-benefit check done. Await MD advice on which med. Noted no PT. No further CM needs. Informed patient of co pay. No needs.    Final next level of care: Home/Self Care Barriers to Discharge: No Barriers Identified   Patient Goals and CMS Choice        Discharge Placement                       Discharge Plan and Services                                     Social Determinants of Health (SDOH) Interventions     Readmission Risk Interventions No flowsheet data found.

## 2021-09-09 DIAGNOSIS — E871 Hypo-osmolality and hyponatremia: Secondary | ICD-10-CM | POA: Diagnosis not present

## 2021-09-09 DIAGNOSIS — N1831 Chronic kidney disease, stage 3a: Secondary | ICD-10-CM | POA: Diagnosis not present

## 2021-09-09 DIAGNOSIS — E039 Hypothyroidism, unspecified: Secondary | ICD-10-CM | POA: Diagnosis not present

## 2021-09-09 DIAGNOSIS — I129 Hypertensive chronic kidney disease with stage 1 through stage 4 chronic kidney disease, or unspecified chronic kidney disease: Secondary | ICD-10-CM | POA: Diagnosis not present

## 2021-09-09 DIAGNOSIS — E1122 Type 2 diabetes mellitus with diabetic chronic kidney disease: Secondary | ICD-10-CM | POA: Diagnosis not present

## 2021-09-09 DIAGNOSIS — E139 Other specified diabetes mellitus without complications: Secondary | ICD-10-CM | POA: Diagnosis not present

## 2021-10-02 ENCOUNTER — Emergency Department (HOSPITAL_BASED_OUTPATIENT_CLINIC_OR_DEPARTMENT_OTHER): Payer: Medicare PPO

## 2021-10-02 ENCOUNTER — Other Ambulatory Visit: Payer: Self-pay

## 2021-10-02 ENCOUNTER — Encounter (HOSPITAL_BASED_OUTPATIENT_CLINIC_OR_DEPARTMENT_OTHER): Payer: Self-pay | Admitting: Emergency Medicine

## 2021-10-02 ENCOUNTER — Emergency Department (HOSPITAL_BASED_OUTPATIENT_CLINIC_OR_DEPARTMENT_OTHER)
Admission: EM | Admit: 2021-10-02 | Discharge: 2021-10-02 | Disposition: A | Discharge status: Home / Self Care | Payer: Medicare PPO | Attending: Emergency Medicine | Admitting: Emergency Medicine

## 2021-10-02 DIAGNOSIS — E039 Hypothyroidism, unspecified: Secondary | ICD-10-CM | POA: Diagnosis not present

## 2021-10-02 DIAGNOSIS — R42 Dizziness and giddiness: Secondary | ICD-10-CM | POA: Diagnosis not present

## 2021-10-02 DIAGNOSIS — Z79899 Other long term (current) drug therapy: Secondary | ICD-10-CM | POA: Diagnosis not present

## 2021-10-02 DIAGNOSIS — N179 Acute kidney failure, unspecified: Secondary | ICD-10-CM | POA: Diagnosis not present

## 2021-10-02 DIAGNOSIS — Z87891 Personal history of nicotine dependence: Secondary | ICD-10-CM | POA: Insufficient documentation

## 2021-10-02 DIAGNOSIS — Z7901 Long term (current) use of anticoagulants: Secondary | ICD-10-CM | POA: Diagnosis not present

## 2021-10-02 DIAGNOSIS — Z8616 Personal history of COVID-19: Secondary | ICD-10-CM | POA: Insufficient documentation

## 2021-10-02 DIAGNOSIS — Z794 Long term (current) use of insulin: Secondary | ICD-10-CM | POA: Diagnosis not present

## 2021-10-02 DIAGNOSIS — I10 Essential (primary) hypertension: Secondary | ICD-10-CM | POA: Diagnosis not present

## 2021-10-02 DIAGNOSIS — E1165 Type 2 diabetes mellitus with hyperglycemia: Secondary | ICD-10-CM | POA: Diagnosis not present

## 2021-10-02 DIAGNOSIS — E119 Type 2 diabetes mellitus without complications: Secondary | ICD-10-CM | POA: Diagnosis not present

## 2021-10-02 DIAGNOSIS — R001 Bradycardia, unspecified: Secondary | ICD-10-CM | POA: Insufficient documentation

## 2021-10-02 LAB — COMPREHENSIVE METABOLIC PANEL
ALT: 21 U/L (ref 0–44)
AST: 24 U/L (ref 15–41)
Albumin: 4.1 g/dL (ref 3.5–5.0)
Alkaline Phosphatase: 81 U/L (ref 38–126)
Anion gap: 13 (ref 5–15)
BUN: 28 mg/dL — ABNORMAL HIGH (ref 8–23)
CO2: 22 mmol/L (ref 22–32)
Calcium: 9.7 mg/dL (ref 8.9–10.3)
Chloride: 96 mmol/L — ABNORMAL LOW (ref 98–111)
Creatinine, Ser: 1.53 mg/dL — ABNORMAL HIGH (ref 0.44–1.00)
GFR, Estimated: 33 mL/min — ABNORMAL LOW (ref >60)
Glucose, Bld: 151 mg/dL — ABNORMAL HIGH (ref 70–99)
Potassium: 3.7 mmol/L (ref 3.5–5.1)
Sodium: 131 mmol/L — ABNORMAL LOW (ref 135–145)
Total Bilirubin: 0.7 mg/dL (ref 0.3–1.2)
Total Protein: 7.9 g/dL (ref 6.5–8.1)

## 2021-10-02 LAB — CBC WITH DIFFERENTIAL/PLATELET
Abs Immature Granulocytes: 0.02 10*3/uL (ref 0.00–0.07)
Basophils Absolute: 0.1 10*3/uL (ref 0.0–0.1)
Basophils Relative: 1 %
Eosinophils Absolute: 0.1 10*3/uL (ref 0.0–0.5)
Eosinophils Relative: 1 %
HCT: 39.9 % (ref 36.0–46.0)
Hemoglobin: 13.2 g/dL (ref 12.0–15.0)
Immature Granulocytes: 0 %
Lymphocytes Relative: 19 %
Lymphs Abs: 1.3 10*3/uL (ref 0.7–4.0)
MCH: 28.1 pg (ref 26.0–34.0)
MCHC: 33.1 g/dL (ref 30.0–36.0)
MCV: 85.1 fL (ref 80.0–100.0)
Monocytes Absolute: 0.6 10*3/uL (ref 0.1–1.0)
Monocytes Relative: 9 %
Neutro Abs: 4.8 10*3/uL (ref 1.7–7.7)
Neutrophils Relative %: 70 %
Platelets: 265 10*3/uL (ref 150–400)
RBC: 4.69 MIL/uL (ref 3.87–5.11)
RDW: 13.9 % (ref 11.5–15.5)
WBC: 6.9 10*3/uL (ref 4.0–10.5)
nRBC: 0 % (ref 0.0–0.2)

## 2021-10-02 LAB — URINALYSIS, ROUTINE W REFLEX MICROSCOPIC
Bilirubin Urine: NEGATIVE
Glucose, UA: NEGATIVE mg/dL
Ketones, ur: NEGATIVE mg/dL
Nitrite: NEGATIVE
Protein, ur: NEGATIVE mg/dL
Specific Gravity, Urine: 1.015 (ref 1.005–1.030)
pH: 5.5 (ref 5.0–8.0)

## 2021-10-02 LAB — TROPONIN I (HIGH SENSITIVITY)
Troponin I (High Sensitivity): 6 ng/L (ref <18)
Troponin I (High Sensitivity): 4 ng/L (ref <18)

## 2021-10-02 LAB — URINALYSIS, MICROSCOPIC (REFLEX)

## 2021-10-02 LAB — CBG MONITORING, ED: Glucose-Capillary: 128 mg/dL — ABNORMAL HIGH (ref 70–99)

## 2021-10-02 MED ORDER — MECLIZINE HCL 25 MG PO TABS
25.0000 mg | ORAL_TABLET | Freq: Once | ORAL | Status: AC
  Administered 2021-10-02: 18:00:00 25 mg via ORAL
  Filled 2021-10-02: qty 1

## 2021-10-02 MED ORDER — SODIUM CHLORIDE 0.9 % IV BOLUS
1000.0000 mL | Freq: Once | INTRAVENOUS | Status: AC
  Administered 2021-10-02: 18:00:00 1000 mL via INTRAVENOUS

## 2021-10-02 NOTE — ED Notes (Signed)
Pt ambulated with pulse ox, and pt HR ranged from 55-60. Pt states that she is feeling much better.

## 2021-10-02 NOTE — ED Provider Notes (Signed)
[facility] Provider Note   CSN: [id number] Arrival date & time: 10/02/21  1646     History Chief Complaint  Patient presents with   Dizziness    [name] is a 86 y.o. female hx of HL, HTN, DM, here with dizziness.  Patient states that she woke up around 2 AM and did not feel well.  She ate some deviled eggs. She states that she checked her blood sugar was reading high at that point.  She gave herself insulin and states that her blood sugar came down to about 200.  Patient states that she woke up again this morning felt lightheaded and dizzy.  She states that her family checked her blood pressure and it was around 90.  Patient was given water.  Patient was brought here for IV fluids.  Patient denies any vomiting or abdominal pain.  Patient is already on Eliquis for A. fib.  Patient was recently admitted for A. fib with RVR.  Patient denies any history of stroke.  The history is provided by the patient.      Past Medical History:  Diagnosis Date   Colon polyp    Diabetes mellitus    Hyperlipidemia    Hypertension    Poor appetite    Thyroid disease     Patient Active Problem List   Diagnosis Date Noted   COVID-19 virus infection 03/13/2021   DKA (diabetic ketoacidosis) ([place]) 03/13/2021   Hyperglycemia 03/23/2020   Uncontrolled type 2 diabetes mellitus with hyperglycemia ([place]) 10/26/2018   Hypercholesterolemia 03/15/2018   Atrial fibrillation with RVR ([place]) 08/18/2017   Left hip pain 06/07/2017   Hypothyroidism 02/03/2015   Essential hypertension 07/10/2013   Type 2 diabetes mellitus ([place]) 07/10/2013   Upper respiratory infection 07/10/2013   PAF (paroxysmal atrial fibrillation) ([place]) 12/26/2012   Postop check 04/13/2011    Past Surgical History:  Procedure Laterality Date   BREAST EXCISIONAL BIOPSY Right    CARDIOVASCULAR STRESS TEST  07/15/2006   Normal scan, no ECG changes   TRANSTHORACIC ECHOCARDIOGRAM  01/08/2011   EF 60-65%,  moderae mitral regurg, LA mild-moderately dilated,     OB History   No obstetric history on file.     Family History  Problem Relation Age of Onset   Diabetes Mother    Hypertension Father    Hyperlipidemia Father    Diabetes Sister    Hypertension Sister    Hyperlipidemia Sister    Hypertension Brother    Stroke Sister    Hypertension Sister    Diabetes Sister    Cancer Daughter    Heart failure Child    Breast cancer Neg Hx     Social History   Tobacco Use   Smoking status: Former    Types: Cigarettes    Quit date: 05/18/2003    Years since quitting: 18.3   Smokeless tobacco: Never  Vaping Use   Vaping Use: Never used  Substance Use Topics   Alcohol use: No   Drug use: No    Home Medications Prior to Admission medications   Medication Sig Start Date End Date Taking? Authorizing Provider  apixaban (ELIQUIS) 2.5 MG TABS tablet Take 1 tablet (2.5 mg total) by mouth 2 (two) times daily. 12/18/20   Hilty, Nadean Corwin, MD  atorvastatin (LIPITOR) 20 MG tablet Take 20 mg by mouth daily. 02/01/20   [provider]  furosemide (LASIX) 20 MG tablet Take 20 mg by mouth daily as needed for edema. 03/30/21  [provider]  insulin aspart (NOVOLOG FLEXPEN) 100 UNIT/ML FlexPen Inject 8 Units into the skin 3 (three) times daily with meals. 09/04/21   Mariel Aloe, MD  insulin glargine, 2 Unit Dial, (TOUJEO MAX SOLOSTAR) 300 UNIT/ML Solostar Pen Inject 12 Units into the skin daily. 09/04/21   Mariel Aloe, MD  levothyroxine (SYNTHROID) 75 MCG tablet Take 75 mcg by mouth daily before breakfast. 03/09/21   [provider]  metoprolol succinate (TOPROL-XL) 50 MG 24 hr tablet TAKE 1 TABLET BY MOUTH TWICE A DAY Patient taking differently: Take 50 mg by mouth 2 (two) times daily. 02/09/21   Hilty, Nadean Corwin, MD  olmesartan (BENICAR) 20 MG tablet Take 0.5 tablets (10 mg total) by mouth daily. 09/04/21   Mariel Aloe, MD  TIADYLT ER 180 MG 24 hr capsule TAKE 1  CAPSULE BY MOUTH EVERY DAY Patient taking differently: Take 180 mg by mouth daily at 12 noon. 11/17/20   Hilty, Nadean Corwin, MD    Allergies    Patient has no known allergies.  Review of Systems   Review of Systems  Neurological:  Positive for dizziness.  All other systems reviewed and are negative.  Physical Exam Updated Vital Signs BP (!) 95/50 (BP Location: Left Arm)    Pulse 62    Temp 97.6 F (36.4 C) (Oral)    Resp 18    SpO2 95%   Physical Exam Vitals and nursing note reviewed.  Constitutional:      Appearance: Normal appearance.  HENT:     Head: Normocephalic.     Nose: Nose normal.     Mouth/Throat:     Mouth: Mucous membranes are moist.  Eyes:     Pupils: Pupils are equal, round, and reactive to light.     Comments: Some nystagmus to the right.  No rotatory nystagmus and does not change with direction.  Cardiovascular:     Rate and Rhythm: Normal rate and regular rhythm.     Pulses: Normal pulses.     Heart sounds: Normal heart sounds.  Pulmonary:     Effort: Pulmonary effort is normal.     Breath sounds: Normal breath sounds.  Abdominal:     General: Abdomen is flat.     Palpations: Abdomen is soft.  Musculoskeletal:        General: Normal range of motion.     Cervical back: Normal range of motion and neck supple.  Skin:    General: Skin is warm.  Neurological:     General: No focal deficit present.     Mental Status: She is alert and oriented to person, place, and time.     Cranial Nerves: No cranial nerve deficit.     Sensory: No sensory deficit.     Motor: No weakness.     Coordination: Coordination normal.     Comments: Normal finger-to-nose bilaterally.  No obvious facial droop.  Patient has normal strength and sensation bilaterally as well.  Psychiatric:        Mood and Affect: Mood normal.        Behavior: Behavior normal.    ED Results / Procedures / Treatments   Labs (all labs ordered are listed, but only abnormal results are displayed) Labs  Reviewed  CBG MONITORING, ED - Abnormal; Notable for the following components:      Result Value   Glucose-Capillary 128 (*)    All other components within normal limits  URINALYSIS, ROUTINE W REFLEX MICROSCOPIC  CBC WITH DIFFERENTIAL/PLATELET  COMPREHENSIVE METABOLIC PANEL  TROPONIN I (HIGH SENSITIVITY)    EKG EKG Interpretation  Date/Time:  Friday October 02 2021 17:09:59 EST Ventricular Rate:  55 PR Interval:  152 QRS Duration: 80 QT Interval:  470 QTC Calculation: 449 R Axis:   26 Text Interpretation: Sinus bradycardia Right atrial enlargement Nonspecific ST abnormality Abnormal ECG No significant change since last tracing Confirmed by Wandra Arthurs [phone]) on 10/02/2021 5:31:47 PM  Radiology No results found.  Procedures Procedures   Medications Ordered in ED Medications  sodium chloride 0.9 % bolus 1,000 mL (has no administration in time range)  meclizine (ANTIVERT) tablet 25 mg (has no administration in time range)    ED Course  I have reviewed the triage vital signs and the nursing notes.  Pertinent labs & imaging results that were available during my care of the patient were reviewed by me and considered in my medical decision making (see chart for details).    MDM Rules/Calculators/A&P                         Seniya Stoffers is a 86 y.o. female here with dizziness and lightheadedness.  I think likely orthostasis.  Patient was noted to be hypotensive initially.  Patient's blood sugar was elevated earlier today but is in the low 100s on arrival.  We will get CBC and CMP and hydrate patient.  Given that she has some horizontal nystagmus, I think likely peripheral vertigo but given her age, will get a CT head.  If CT head is negative and she felt better with IV fluids and meclizine, she does not need MRI right now  8:12 PM Ct head showed no bleed.  Creatinine is 1.5 which is slightly elevated compared to baseline.  Patient is bradycardic in the upper 40s.  Patient has  sinus bradycardia and no heart block.  Patient is able to ambulate and her heart rate goes up to the 50s with ambulation. Troponin negative x2.  I think her dizziness is likely multifactorial-symptomatic bradycardia and mild dehydration.  Patient really does not want to get admitted at this time.  I told her to hold metoprolol for several days and follow-up with her heart doctor.  She will need a repeat kidney function tests in a week as well.      Final Clinical Impression(s) / ED Diagnoses Final diagnoses:  None    Rx / DC Orders ED Discharge Orders     None        Drenda Freeze, MD 10/02/21 2014

## 2021-10-02 NOTE — Discharge Instructions (Addendum)
Your kidney function is slightly abnormal likely from dehydration.  As we discussed, I recommend taking Gatorade but please watch your blood sugar levels  Please stay hydrated  Your heart rate is low.  You need to hold your metoprolol for several days and follow-up with your cardiologist next week.  You need to get a repeat kidney function next week   Return to ER if you have worse dizziness, passing out, chest pain, blood sugar greater than 600 or less than 60.

## 2021-10-02 NOTE — ED Triage Notes (Signed)
Pt presents to ED POV. Pt c/o dizziness and lightheaded. Pt erports that she feels dizzy when she goes from sitting to standing and also when walking. Pt reports ataxia d/t dizziness. S/s began yesterday sometime in the afternoon.

## 2021-10-06 ENCOUNTER — Other Ambulatory Visit: Payer: Self-pay

## 2021-10-06 ENCOUNTER — Encounter: Payer: Self-pay | Admitting: Internal Medicine

## 2021-10-06 ENCOUNTER — Ambulatory Visit (INDEPENDENT_AMBULATORY_CARE_PROVIDER_SITE_OTHER): Payer: Medicare PPO | Admitting: Internal Medicine

## 2021-10-06 VITALS — BP 120/66 | HR 96 | Ht 62.0 in | Wt 134.8 lb

## 2021-10-06 DIAGNOSIS — E118 Type 2 diabetes mellitus with unspecified complications: Secondary | ICD-10-CM | POA: Diagnosis not present

## 2021-10-06 DIAGNOSIS — I480 Paroxysmal atrial fibrillation: Secondary | ICD-10-CM

## 2021-10-06 DIAGNOSIS — I10 Essential (primary) hypertension: Secondary | ICD-10-CM

## 2021-10-06 DIAGNOSIS — Z794 Long term (current) use of insulin: Secondary | ICD-10-CM

## 2021-10-06 MED ORDER — METOPROLOL SUCCINATE ER 25 MG PO TB24: 25.0000 mg | ORAL_TABLET | Freq: Every day | ORAL | 3 refills | Status: DC

## 2021-10-06 NOTE — Progress Notes (Signed)
OFFICE NOTE  Chief Complaint:  ER follow-up  Primary Care Physician: Deland Pretty, MD  HPI:  [name] is a 86 year old overweight African American female with a history of paroxysmal atrial fibrillation on Coumadin, hypothyroidism, dyslipidemia, diabetes mellitus type 2, hypertension.  She had an episode of PAF in 2014, but this went away fairly quickly and she is not been symptomatic since then. This may be related to her not taking her medications as scheduled. Since then she's been taking her metoprolol twice daily and has had no real problems. Her warfarin level remains therapeutic and well controlled. Over the past several months she's had a problem with breakthrough paroxysmal atrial fibrillation.  In fact at this point she seems to be persistent.  She saw 2 other physician assistants in our office to were able to get her rate controlled. Once she was at a normal ventricular rate, she felt fairly good. In fact she cannot really tell difference when she is in A. fib versus when she is in sinus rhythm.  Previously her episode was possibly related to thyroid and balance, however her thyroid levels have been well controlled as has her diabetes.   [name] returns today for follow-up. Her EKG shows sinus rhythm. It is notable that she is bradycardic today with heart rate in the upper 40s. INR was checked in the office today and is low at 1.7. This will be adjusted by Erasmo Downer, our anticoagulation pharmacist. She denies any complaints such as shortness of breath, increasing fatigue, presyncope or syncopal symptoms. She also denies any chest pain or palpitations and is unaware of any recurrent atrial fibrillation. She's recently been started on an injectable insulin in addition to her oral Glucophage for diabetes. Her cholesterol is also managed by her primary care provider.  07/15/2016  [name] returns today for follow-up. She's been followed monthly for her INRs which been therapeutic.  She reports feeling well denies any chest pain or recurrent atrial fibrillation. EKG shows sinus rhythm. Overall she feels that she is doing well. Blood prssure was mildly elevated today recently was 122/60 in the office.  08/18/2017  [name] returns today for follow-up.  She was last seen about a year ago.  Overall she seems to be doing okay but she is slowing down.  She says she cannot work quite as much as she could previously.  Although she has a history of atrial fibrillation, recently she has had no recurrence.  Today however she is noted to be in A. fib with RVR and heart rate of 123.  He denies any chest pain or worsening shortness of breath with this.  03/15/2018  [name] was seen today in follow-up.  Overall she is feeling well.  Her INR was therapeutic today.  Blood pressure is well controlled.  A. fib rate is slightly fast at 101.  She is asymptomatic with this.  She reports good energy level except in the mornings where she is somewhat fatigued.  Hemoglobin A1c recently in December was 9.9 however she has had medication adjustment and lost about 10 pounds since then.  The dietary changes should have helped her.  In December her cholesterol was very elevated as well with total cholesterol 261, HDL 57, LDL 173 and triglycerides 153.  We discussed her diet today and she reports she still eats out almost every day.  She has been going to a fish restaurant and eating fried fish several times a week which likely is raising her saturated fats  intake.  I we discussed healthier options she can choose however she is unlikely to meet her LDL goal less than 70.  08/29/2019  [name] returns today for follow-up.  Overall she is doing well.  Recent labs showed improvement in her dyslipidemia.  Her total cholesterol is now 90, HDL 42, LDL 35 and triglycerides 65 on 40 mg atorvastatin.  She does report some improvement in her hemoglobin A1c.  She remains in stable rate controlled A. fib.  She has no  bleeding problems on Eliquis.  She denies any shortness of breath or chest pain.  She is active.  06/18/2020  [name] is seen today in follow-up.  She continues to do well.  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  Blood pressure is excellent today 128/56.  She denies any recurrent A. fib and her EKG shows sinus bradycardia at 51 today.  She denies any bleeding issues on low-dose apixaban 2.5 mg twice daily.  Lab work more recently showed excellent cholesterol control with LDL in the 50s and total cholesterol under 100.  Unfortunately in June she was admitted for issues with severe hyperglycemia and A1c was 16.7.  Since then she has had some improvements in her blood sugars but is looking to transition to a different endocrinologist as she has struggled with some hyperglycemia in the evenings and some occasional morning hypoglycemia.  10/06/2021  [name] is seen today in follow-up.  I last saw her in September 2021.  Since then this past fall she was admitted for A. fib with RVR.  Medications were adjusted she spontaneously converted back to rhythm.  She was taken off her diuretic.  She has had issues with hyperglycemia and recently some dehydration.  She was in the hospital emergency department with dizziness and found to be somewhat dehydrated with creatinine of 1.5 and was bradycardic.  There was concerned that her heart rate was not increasing significantly.  She was on high-dose Toprol 50 mg twice daily.  She was advised to stop her Toprol until following up with me today.  Since follow-up she says she is felt better.  She is less dizzy.  She was also advised by the ER physician to purchase Gatorade 0 and has been drinking that some.  She has a follow-up with a new endocrinologist soon to help with her diabetes management.  PMHx:  Past Medical History:  Diagnosis Date   Colon polyp    Diabetes mellitus    Hyperlipidemia    Hypertension     Poor appetite    Thyroid disease     Past Surgical History:  Procedure Laterality Date   BREAST EXCISIONAL BIOPSY Right    CARDIOVASCULAR STRESS TEST  07/15/2006   Normal scan, no ECG changes   TRANSTHORACIC ECHOCARDIOGRAM  01/08/2011   EF 60-65%, moderae mitral regurg, LA mild-moderately dilated,    FAMHx:  Family History  Problem Relation Age of Onset   Diabetes Mother    Hypertension Father    Hyperlipidemia Father    Diabetes Sister    Hypertension Sister    Hyperlipidemia Sister    Hypertension Brother    Stroke Sister    Hypertension Sister    Diabetes Sister    Cancer Daughter    Heart failure Child    Breast cancer Neg Hx     SOCHx:   reports that she quit smoking about 18 years ago. Her smoking use included cigarettes. She  has never used smokeless tobacco. She reports that she does not drink alcohol and does not use drugs.  ALLERGIES:  No Known Allergies  ROS: Pertinent items noted in HPI and remainder of comprehensive ROS otherwise negative.  HOME MEDS: Current Outpatient Medications  Medication Sig Dispense Refill   apixaban (ELIQUIS) 2.5 MG TABS tablet Take 1 tablet (2.5 mg total) by mouth 2 (two) times daily. 60 tablet 6   atorvastatin (LIPITOR) 20 MG tablet Take 20 mg by mouth daily.     furosemide (LASIX) 20 MG tablet Take 20 mg by mouth daily as needed for edema.     insulin aspart (NOVOLOG FLEXPEN) 100 UNIT/ML FlexPen Inject 8 Units into the skin 3 (three) times daily with meals. 15 mL 0   insulin glargine, 2 Unit Dial, (TOUJEO MAX SOLOSTAR) 300 UNIT/ML Solostar Pen Inject 12 Units into the skin daily. 15 mL 0   levothyroxine (SYNTHROID) 75 MCG tablet Take 75 mcg by mouth daily before breakfast.     metoprolol succinate (TOPROL-XL) 50 MG 24 hr tablet TAKE 1 TABLET BY MOUTH TWICE A DAY (Patient taking differently: Take 50 mg by mouth 2 (two) times daily.) 180 tablet 2   olmesartan (BENICAR) 20 MG tablet Take 0.5 tablets (10 mg total) by mouth daily.      TIADYLT ER 180 MG 24 hr capsule TAKE 1 CAPSULE BY MOUTH EVERY DAY (Patient taking differently: Take 180 mg by mouth daily at 12 noon.) 90 capsule 3   No current facility-administered medications for this visit.    LABS/IMAGING: No results found for this or any previous visit (from the past 48 hour(s)). No results found.  VITALS: BP 120/66    Pulse 96    Ht 5\' 2"  (1.575 m)    Wt 134 lb 12.8 oz (61.1 kg)    SpO2 97%    BMI 24.66 kg/m   EXAM: General appearance: alert and no distress Neck: no carotid bruit, no JVD and thyroid not enlarged, symmetric, no tenderness/mass/nodules Lungs: clear to auscultation bilaterally Heart: irregularly irregular rhythm and Tachycardic Abdomen: soft, non-tender; bowel sounds normal; no masses,  no organomegaly Extremities: extremities normal, atraumatic, no cyanosis or edema Pulses: 2+ and symmetric Skin: Skin color, texture, turgor normal. No rashes or lesions Neurologic: Grossly normal Psych: Pleasant  EKG: Sinus rhythm at 96, voltage criteria for LVH, nonspecific ST and T wave changes- personally reviewed  ASSESSMENT: Paroxysmal A. Fib - CHADSVASC score of 4 on low-dose Eliquis 2.5 mg twice daily Hypertension Diabetes type 2  Hypothyroidism Dyslipidemia  PLAN: 1.   [name] had recent breakthrough A. fib with RVR in October.  Since then she had been on high-dose Toprol-XL 50 mg twice daily.  She was seen in the ER just a few days ago with bradycardia and dizziness thought to be also related to dehydration.  That is improved after discontinuing her beta-blocker.  Unfortunately heart rates up in the 90s and there is risk of recurrent atrial fibrillation.  I did advise restarting Toprol-XL 50 mg daily which is 50% of her previous dose.  Continue anticoagulation with Eliquis 2.5 mg twice daily  Follow-up with me in 6 months or sooner as necessary.  Pixie Casino, MD, [facility], [place] Director of the  Advanced Lipid Disorders &  Cardiovascular Risk Reduction Clinic Diplomate of the American Board of Clinical Lipidology Attending Cardiologist  Direct Dial: [phone]   Fax: [phone]  Website:  [email or web address]  Nadean Corwin Adrick Kestler 10/06/2021, 1:54 PM

## 2021-10-06 NOTE — Patient Instructions (Signed)
Medication Instructions:  Your physician has recommended you make the following change in your medication:  DECREASE Toprol XL to 50 mg tablets ONCE DAILY   *If you need a refill on your cardiac medications before your next appointment, please call your pharmacy*   Lab Work: None If you have labs (blood work) drawn today and your tests are completely normal, you will receive your results only by: [place] (if you have MyChart) OR A paper copy in the mail If you have any lab test that is abnormal or we need to change your treatment, we will call you to review the results.   Testing/Procedures: None   Follow-Up: At [facility], you and your health needs are our priority.  As part of our continuing mission to provide you with exceptional heart care, we have created designated Provider Care Teams.  These Care Teams include your primary Cardiologist (physician) and Advanced Practice Providers (APPs -  Physician Assistants and Nurse Practitioners) who all work together to provide you with the care you need, when you need it.  We recommend signing up for the patient portal called "MyChart".  Sign up information is provided on this After Visit Summary.  MyChart is used to connect with patients for Virtual Visits (Telemedicine).  Patients are able to view lab/test results, encounter notes, upcoming appointments, etc.  Non-urgent messages can be sent to your provider as well.   To learn more about what you can do with MyChart, go to [email or web address].    Your next appointment:   6 month(s)  The format for your next appointment:   In Person  Provider:   Lyman Bishop, MD    Other Instructions

## 2021-10-09 DIAGNOSIS — Z79890 Hormone replacement therapy (postmenopausal): Secondary | ICD-10-CM | POA: Diagnosis not present

## 2021-10-09 DIAGNOSIS — E890 Postprocedural hypothyroidism: Secondary | ICD-10-CM | POA: Insufficient documentation

## 2021-10-09 DIAGNOSIS — N183 Chronic kidney disease, stage 3 unspecified: Secondary | ICD-10-CM | POA: Diagnosis not present

## 2021-10-09 DIAGNOSIS — E1165 Type 2 diabetes mellitus with hyperglycemia: Secondary | ICD-10-CM | POA: Diagnosis not present

## 2021-10-09 DIAGNOSIS — E1122 Type 2 diabetes mellitus with diabetic chronic kidney disease: Secondary | ICD-10-CM | POA: Diagnosis not present

## 2021-10-09 DIAGNOSIS — Z794 Long term (current) use of insulin: Secondary | ICD-10-CM | POA: Diagnosis not present

## 2021-10-28 DIAGNOSIS — Z794 Long term (current) use of insulin: Secondary | ICD-10-CM | POA: Diagnosis not present

## 2021-10-28 DIAGNOSIS — Z79890 Hormone replacement therapy (postmenopausal): Secondary | ICD-10-CM | POA: Diagnosis not present

## 2021-10-28 DIAGNOSIS — E1322 Other specified diabetes mellitus with diabetic chronic kidney disease: Secondary | ICD-10-CM | POA: Diagnosis not present

## 2021-10-28 DIAGNOSIS — E1365 Other specified diabetes mellitus with hyperglycemia: Secondary | ICD-10-CM | POA: Diagnosis not present

## 2021-10-28 DIAGNOSIS — N183 Chronic kidney disease, stage 3 unspecified: Secondary | ICD-10-CM | POA: Diagnosis not present

## 2021-10-28 DIAGNOSIS — E890 Postprocedural hypothyroidism: Secondary | ICD-10-CM | POA: Diagnosis not present

## 2021-10-28 DIAGNOSIS — Z7985 Long-term (current) use of injectable non-insulin antidiabetic drugs: Secondary | ICD-10-CM | POA: Diagnosis not present

## 2021-10-28 DIAGNOSIS — R809 Proteinuria, unspecified: Secondary | ICD-10-CM | POA: Diagnosis not present

## 2021-10-29 DIAGNOSIS — E1065 Type 1 diabetes mellitus with hyperglycemia: Secondary | ICD-10-CM | POA: Diagnosis not present

## 2021-11-09 DIAGNOSIS — Z794 Long term (current) use of insulin: Secondary | ICD-10-CM | POA: Diagnosis not present

## 2021-11-09 DIAGNOSIS — Z7985 Long-term (current) use of injectable non-insulin antidiabetic drugs: Secondary | ICD-10-CM | POA: Diagnosis not present

## 2021-11-09 DIAGNOSIS — E1365 Other specified diabetes mellitus with hyperglycemia: Secondary | ICD-10-CM | POA: Diagnosis not present

## 2021-11-17 DIAGNOSIS — E118 Type 2 diabetes mellitus with unspecified complications: Secondary | ICD-10-CM | POA: Diagnosis not present

## 2021-11-17 DIAGNOSIS — E039 Hypothyroidism, unspecified: Secondary | ICD-10-CM | POA: Diagnosis not present

## 2021-11-17 DIAGNOSIS — I10 Essential (primary) hypertension: Secondary | ICD-10-CM | POA: Diagnosis not present

## 2021-11-19 DIAGNOSIS — I10 Essential (primary) hypertension: Secondary | ICD-10-CM | POA: Diagnosis not present

## 2021-11-19 DIAGNOSIS — Z0000 Encounter for general adult medical examination without abnormal findings: Secondary | ICD-10-CM | POA: Diagnosis not present

## 2021-11-19 DIAGNOSIS — D6859 Other primary thrombophilia: Secondary | ICD-10-CM | POA: Diagnosis not present

## 2021-11-19 DIAGNOSIS — E139 Other specified diabetes mellitus without complications: Secondary | ICD-10-CM | POA: Diagnosis not present

## 2021-11-19 DIAGNOSIS — E039 Hypothyroidism, unspecified: Secondary | ICD-10-CM | POA: Diagnosis not present

## 2021-11-26 DIAGNOSIS — E785 Hyperlipidemia, unspecified: Secondary | ICD-10-CM | POA: Diagnosis not present

## 2021-11-26 DIAGNOSIS — I10 Essential (primary) hypertension: Secondary | ICD-10-CM | POA: Diagnosis not present

## 2021-11-27 ENCOUNTER — Other Ambulatory Visit: Payer: Self-pay | Admitting: Internal Medicine

## 2021-12-02 DIAGNOSIS — E139 Other specified diabetes mellitus without complications: Secondary | ICD-10-CM | POA: Diagnosis not present

## 2021-12-02 DIAGNOSIS — Z794 Long term (current) use of insulin: Secondary | ICD-10-CM | POA: Diagnosis not present

## 2021-12-02 DIAGNOSIS — Z7985 Long-term (current) use of injectable non-insulin antidiabetic drugs: Secondary | ICD-10-CM | POA: Diagnosis not present

## 2021-12-03 DIAGNOSIS — R109 Unspecified abdominal pain: Secondary | ICD-10-CM | POA: Diagnosis not present

## 2021-12-03 DIAGNOSIS — R079 Chest pain, unspecified: Secondary | ICD-10-CM | POA: Diagnosis not present

## 2021-12-10 ENCOUNTER — Ambulatory Visit (INDEPENDENT_AMBULATORY_CARE_PROVIDER_SITE_OTHER): Payer: Medicare PPO

## 2021-12-10 ENCOUNTER — Encounter: Payer: Self-pay | Admitting: Family Medicine

## 2021-12-10 ENCOUNTER — Other Ambulatory Visit: Payer: Self-pay

## 2021-12-10 ENCOUNTER — Ambulatory Visit: Payer: Medicare PPO | Admitting: Family Medicine

## 2021-12-10 VITALS — BP 142/74 | HR 66 | Ht 62.0 in | Wt 147.2 lb

## 2021-12-10 DIAGNOSIS — N644 Mastodynia: Secondary | ICD-10-CM

## 2021-12-10 DIAGNOSIS — R0789 Other chest pain: Secondary | ICD-10-CM

## 2021-12-10 DIAGNOSIS — R0781 Pleurodynia: Secondary | ICD-10-CM | POA: Diagnosis not present

## 2021-12-10 MED ORDER — GABAPENTIN 100 MG PO CAPS: 100.0000 mg | ORAL_CAPSULE | Freq: Three times a day (TID) | ORAL | 2 refills | Status: DC

## 2021-12-10 NOTE — Progress Notes (Signed)
? ? ?Subjective:   ? ?CC: R-sided chest pain ? ?I, [name], LAT, ATC, am serving as scribe for Dr. [name]. ? ?HPI: Pt is a 87 y/o female presenting w/ c/o R-sided chest pain x one week.  She locates her pain to her R ant chest from her R axilla across her R breast to her sternum.  She denies any rash or breast swelling.  She denies any midline back pain.  No trouble breathing.  No exertional pain. ? ?Radiating pain: yes across her R ant chest ?SOB: no ?Aggravating factors: nothing in particular - constant pain ?Treatments tried: Tylenol;  ? ?Diagnostic testing: EKG- 10/08/21; Chest XR- 03/13/21, 03/22/20 ? ?Pertinent review of Systems: No fevers or chills ? ?Relevant historical information: Diabetes ? ? ?Objective:   ? ?Vitals:  ? 12/10/21 1014  ?BP: (!) 142/74  ?Pulse: 66  ?SpO2: 97%  ? ?General: Well Developed, well nourished, and in no acute distress.  ? ?MSK: T-spine: Nontender midline. ?Right chest wall tender palpation right lateral to anterior chest wall. ?Right breast is tender to palpation without masses palpated. ? ?Lab and Radiology Results ? ?X-ray images T-spine and right ribs and chest obtained today personally and independently interpreted ? ?T-spine: No fractures. No severe DJD.  ? ?Right ribs and chest: No acute fractures.  No infiltrate ? ?Await formal radiology review ? ? ?Impression and Recommendations:   ? ?Assessment and Plan: ?87 y.o. female with right lateral rib to anterior chest pain above the level of the nipple.  Etiology is unclear but she is having much more pain than would be typical for her. ?At this point given the significance of her pain I think we need to proceed with more work-up.  Plan for x-rays as above and a diagnostic mammogram.  We will try treating with physical therapy.  For now continue Tylenol and add low-dose gabapentin especially at bedtime.  If that does not help we can add prednisone but prednisone will increase her blood sugar and is not a great option.   Plan to check back in 3 weeks or sooner if needed.. ? ?PDMP not reviewed this encounter. ?Orders Placed This Encounter  ?Procedures  ? DG Ribs Unilateral W/Chest Right  ?  Standing Status:   Future  ?  Number of Occurrences:   1  ?  Standing Expiration Date:   01/10/2022  ?  Order Specific Question:   Reason for Exam (SYMPTOM  OR DIAGNOSIS REQUIRED)  ?  Answer:   chest pain  ?  Order Specific Question:   Preferred imaging location?  ?  Answer:   [facility]  ? DG Thoracic Spine W/Swimmers  ?  Standing Status:   Future  ?  Number of Occurrences:   1  ?  Standing Expiration Date:   01/10/2022  ?  Order Specific Question:   Reason for Exam (SYMPTOM  OR DIAGNOSIS REQUIRED)  ?  Answer:   chest pain  ?  Order Specific Question:   Preferred imaging location?  ?  Answer:   [facility]  ? MM Digital Diagnostic Unilat R  ?  Standing Status:   Future  ?  Standing Expiration Date:   12/11/2022  ?  Scheduling Instructions:  ?   I do not order these much. Please contact my office if I should change the order or add an ultrasound [phone]  ?  Order Specific Question:   Reason for Exam (SYMPTOM  OR DIAGNOSIS REQUIRED)  ?  Answer:   Right breast pain  ?  Order Specific Question:   Preferred imaging location?  ?  Answer:   [facility]  ? Ambulatory referral to Physical Therapy  ?  Referral Priority:   Routine  ?  Referral Type:   Physical Medicine  ?  Referral Reason:   Specialty Services Required  ?  Requested Specialty:   Physical Therapy  ?  Number of Visits Requested:   1  ? ?Meds ordered this encounter  ?Medications  ? gabapentin (NEURONTIN) 100 MG capsule  ?  Sig: Take 1 capsule (100 mg total) by mouth 3 (three) times daily.  ?  Dispense:  90 capsule  ?  Refill:  2  ? ? ?Discussed warning signs or symptoms. Please see discharge instructions. Patient expresses understanding. ? ? ?The above documentation has been reviewed and is accurate and complete [name], M.D. ? ?

## 2021-12-10 NOTE — Patient Instructions (Addendum)
Nice to meet you today. ? ?Please get an Xray today before you leave. ? ?I've ordered  a diagnostic mammogram.  That office will call you to schedule but please let us know if you haven't heard from them in one week regarding scheduling. ? ?I've also referred you to Physical Therapy.  Their office will call you to schedule but please let us know if you haven't heard from them in one week regarding scheduling. ? ?I've prescribed Gabapentin to take at bedtime.  You can also take this medicine during the day but please start taking it at bedtime to see how you do/feel with it and then can take up to 2x during the day if needed. ? ?Follow-up: 3 weeks ?

## 2021-12-14 NOTE — Progress Notes (Signed)
No rib fractures are visible on the chest x-ray and rib x-ray.

## 2021-12-14 NOTE — Progress Notes (Signed)
Thoracic spine x-ray shows no fracture

## 2021-12-21 DIAGNOSIS — E1065 Type 1 diabetes mellitus with hyperglycemia: Secondary | ICD-10-CM | POA: Diagnosis not present

## 2021-12-23 NOTE — Therapy (Signed)
?OUTPATIENT PHYSICAL THERAPY EVALUATION ? ? ?Patient Name: Nalina Yeatman ?MRN: [id number] ?DOB:30-Jun-1935, 87 y.o., female ?Today's Date: 12/24/2021 ? ? PT End of Session - 12/24/21 2951   ? ? Visit Number 1   ? Number of Visits 12   ? Date for PT Re-Evaluation 02/04/22   ? Authorization Type [employer] $20 copay   ? PT Start Time 0845   ? PT Stop Time 0925   ? PT Time Calculation (min) 40 min   ? Activity Tolerance Patient tolerated treatment well   ? Behavior During Therapy [facility] for tasks assessed/performed   ? ?  ?  ? ?  ? ? ?Past Medical History:  ?Diagnosis Date  ? Colon polyp   ? Diabetes mellitus   ? Hyperlipidemia   ? Hypertension   ? Poor appetite   ? Thyroid disease   ? ?Past Surgical History:  ?Procedure Laterality Date  ? BREAST EXCISIONAL BIOPSY Right   ? CARDIOVASCULAR STRESS TEST  07/15/2006  ? Normal scan, no ECG changes  ? TRANSTHORACIC ECHOCARDIOGRAM  01/08/2011  ? EF 60-65%, moderae mitral regurg, LA mild-moderately dilated,  ? ?Patient Active Problem List  ? Diagnosis Date Noted  ? COVID-19 virus infection 03/13/2021  ? DKA (diabetic ketoacidosis) ([place]) 03/13/2021  ? Hyperglycemia 03/23/2020  ? Uncontrolled type 2 diabetes mellitus with hyperglycemia ([place]) 10/26/2018  ? Hypercholesterolemia 03/15/2018  ? Atrial fibrillation with RVR ([place]) 08/18/2017  ? Left hip pain 06/07/2017  ? Hypothyroidism 02/03/2015  ? Essential hypertension 07/10/2013  ? Type 2 diabetes mellitus ([place]) 07/10/2013  ? Upper respiratory infection 07/10/2013  ? PAF (paroxysmal atrial fibrillation) ([place]) 12/26/2012  ? Postop check 04/13/2011  ? ? ?PCP: Deland Pretty, MD ? ?REFERRING PROVIDER: Gregor Hams, MD ? ?REFERRING DIAG: R07.89 (ICD-10-CM) - Other chest pain ? ?THERAPY DIAG:  ?Pain in right arm ? ?Other muscle spasm ? ?Muscle weakness (generalized) ? ?Abnormal posture ? ? ?ONSET DATE: About a month ago ? ?SUBJECTIVE:                                                                                                                                                                                      ? ?SUBJECTIVE STATEMENT: ?Insidious onset about 1 month ago.  Pt indicated she might connect twisting a mattress around that time.  Reported having some similar complaints several years ago and saw MD about it.  Pt indicated complaints from Rt arm pit across Rt chest.  Pt stated she was having trouble sleeping due to symptoms but has been given some medicine that helped.  Pt also indicated having some complaints of abdomen center and Lt complaints.  ? ?PERTINENT HISTORY: ?  Hyperlipidemia, DM, thryoid disease, HTN ? ?PAIN:  ?Are you having pain? Yes: NPRS scale: currently 5/10, at worst 10/10 ?Pain location: Rt arm pit across Rt chest. ?Pain description: intermittent ?Aggravating factors: lifting, turning, housework, reaching back and up ?Relieving factors: medicine for symptoms, heating pad ? ?PRECAUTIONS: None ? ?WEIGHT BEARING RESTRICTIONS No ? ?FALLS:  ?Has patient fallen in last 6 months? No Number of falls: 0 ? ? ?LIVING ENVIRONMENT: ?Lives with: lives with their family and lives alone ?Lives in: House/apartment ? 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  ? ?PLOF: Independent, [place]/yard work, housework ? ?PATIENT GOALS Reduce pain ? ?OBJECTIVE:  ? ?DIAGNOSTIC FINDINGS:  ?12/24/2021: xrays negative for fractures ? ?PATIENT SURVEYS:  ?12/24/2021: FOTO intake 49, predicted 61 ? ?COGNITION: ? 12/24/2021: Overall cognitive status: Within functional limits for tasks assessed ?    ?SENSATION: ?12/24/2021: WFL ? ?POSTURE: ?12/24/2021: Rounded shoulder, mild increased thoracic kyphosis c forward head posture.  ? ?THORACIC ROM: ?12/24/2021:  unrestricted ? ?UPPER EXTREMITY ROM:  ? ?Active ROM Right ?12/24/2021 Left ?12/24/2021  ?Shoulder flexion AROM against gravity 125 degrees ? ?Pain noted throughout range, increased c increased amount   ?Shoulder extension    ?Shoulder abduction    ?Shoulder adduction    ?Shoulder  internal rotation WFL   ?Shoulder external rotation WFL   ?Elbow flexion    ?Elbow extension    ?Wrist flexion    ?Wrist extension    ?Wrist ulnar deviation    ?Wrist radial deviation    ?Wrist pronation    ?Wrist supination    ?(Blank rows = not tested) ? ?UPPER EXTREMITY MMT: ? ?MMT Right ?12/24/2021 Left ?12/24/2021  ?Shoulder flexion 5/5 5/5  ?Shoulder extension    ?Shoulder abduction 3+/5 c pain produced 5/5  ?Shoulder adduction    ?Shoulder internal rotation 5/5 5/5  ?Shoulder external rotation 5/5 5/5  ?Middle trapezius    ?Lower trapezius    ?Elbow flexion 5/5 5/5  ?Elbow extension 5/5 5/5  ?Wrist flexion    ?Wrist extension    ?Wrist ulnar deviation    ?Wrist radial deviation    ?Wrist pronation    ?Wrist supination    ?Grip strength (lbs)    ?(Blank rows = not tested) ? ?PALPATION:  ?12/24/2021: Palpation tenderness along pec major Rt muscle and tendon throughout chest wall into attachment to Rt humerus.  Trigger points and increased tightness noted in palpation.  Tenderness along medial aspect of Rt clavicle, Sternum.  ?  ?TODAY'S TREATMENT:  ?12/24/2021: ?  Therex: HEP instruction/performance c cues for techniques, handout provided.  Trial set performed of each for comprehension and symptom assessment.  See below for exercise list. ? ? ?PATIENT EDUCATION: ?12/24/2021: ?Education details: HEP, POC ?Person educated: Patient ?Education method: Explanation, Demonstration, Verbal cues, and Handouts ?Education comprehension: verbalized understanding and returned demonstration ? ? ?HOME EXERCISE PROGRAM: ?12/24/2021: ?Access Code: [email or web address] ?URL: [email or web address] ?Date: 12/24/2021 ?Prepared by: Scot Jun ? ?Exercises ?Shoulder External Rotation and Scapular Retraction with Resistance - 2-3 x daily - 7 x weekly - 1 sets - 10 reps - 3-5 hold ?[place] Major Stretch - 2-3 x daily - 7 x weekly - 1 sets - 5 reps - 15 hold ?Isometric horizontal adduction 10 sec x 5x 2-3  day ? ?ASSESSMENT: ? ?CLINICAL IMPRESSION: ?Patient is a 87 y.o. who comes to clinic with complaints of Rt chest wall/axilla pain with mobility, strength deficits that impair their ability to perform usual daily  and recreational functional activities without increase difficulty/symptoms at this time.  Patient to benefit from skilled PT services to address impairments and limitations to improve to previous level of function without restriction secondary to condition.  ? ? ?OBJECTIVE IMPAIRMENTS decreased activity tolerance, decreased endurance, decreased mobility, decreased ROM, decreased strength, impaired perceived functional ability, increased muscle spasms, impaired UE functional use, improper body mechanics, postural dysfunction, and pain.  ? ?ACTIVITY LIMITATIONS cleaning, driving, meal prep, occupation, and laundry.  ? ?PERSONAL FACTORS Hyperlipidemia, DM, thryoid disease, HTN are also affecting patient's functional outcome.  ? ? ?REHAB POTENTIAL: Good ? ?CLINICAL DECISION MAKING: Stable/uncomplicated ? ?EVALUATION COMPLEXITY: Low ? ? ?GOALS: ?Goals reviewed with patient? Yes ? ?Short term PT Goals (target date for Short term goals are 3 weeks 01/14/2022) ?Patient will demonstrate independent use of home exercise program to maintain progress from in clinic treatments. ?Goal status: New ?  ?Long term PT goals (target dates for all long term goals are 6 weeks  02/04/2022 ) ?Patient will demonstrate/report pain at worst less than or equal to 2/10 to facilitate minimal limitation in daily activity secondary to pain symptoms. ?Goal status: New ? ?Patient will demonstrate independent use of home exercise program to facilitate ability to maintain/progress functional gains from skilled physical therapy services. ?Goal status: New ? ?Patient will demonstrate FOTO outcome > or = 61 % to indicate reduced disability due to condition. ?Goal status: New ? ?Patient will demonstrate Rt UE MMT 5/5 throughout to facilitate usual  lifting, carrying in functional activity to PLOF s limitation. ?Goal status: New ? ?    5.  Patient will demonstrate Rt [place] joint mobility WFL to facilitate usual self care, dressing, reaching overhead at PLOF s limitation due to

## 2021-12-24 ENCOUNTER — Encounter: Payer: Self-pay | Admitting: Rehabilitative and Restorative Service Providers"

## 2021-12-24 ENCOUNTER — Other Ambulatory Visit: Payer: Self-pay

## 2021-12-24 ENCOUNTER — Ambulatory Visit (INDEPENDENT_AMBULATORY_CARE_PROVIDER_SITE_OTHER): Payer: Medicare PPO | Admitting: Rehabilitative and Restorative Service Providers"

## 2021-12-24 DIAGNOSIS — M62838 Other muscle spasm: Secondary | ICD-10-CM

## 2021-12-24 DIAGNOSIS — M79601 Pain in right arm: Secondary | ICD-10-CM | POA: Diagnosis not present

## 2021-12-24 DIAGNOSIS — M6281 Muscle weakness (generalized): Secondary | ICD-10-CM | POA: Diagnosis not present

## 2021-12-24 DIAGNOSIS — R293 Abnormal posture: Secondary | ICD-10-CM | POA: Diagnosis not present

## 2021-12-25 DIAGNOSIS — I10 Essential (primary) hypertension: Secondary | ICD-10-CM | POA: Diagnosis not present

## 2021-12-25 DIAGNOSIS — E785 Hyperlipidemia, unspecified: Secondary | ICD-10-CM | POA: Diagnosis not present

## 2021-12-30 DIAGNOSIS — E871 Hypo-osmolality and hyponatremia: Secondary | ICD-10-CM | POA: Diagnosis not present

## 2021-12-30 DIAGNOSIS — I10 Essential (primary) hypertension: Secondary | ICD-10-CM | POA: Diagnosis not present

## 2021-12-30 DIAGNOSIS — R7301 Impaired fasting glucose: Secondary | ICD-10-CM | POA: Diagnosis not present

## 2021-12-30 DIAGNOSIS — R42 Dizziness and giddiness: Secondary | ICD-10-CM | POA: Diagnosis not present

## 2021-12-31 ENCOUNTER — Ambulatory Visit (INDEPENDENT_AMBULATORY_CARE_PROVIDER_SITE_OTHER): Payer: Medicare PPO | Admitting: Family Medicine

## 2021-12-31 ENCOUNTER — Encounter: Payer: Self-pay | Admitting: Family Medicine

## 2021-12-31 ENCOUNTER — Other Ambulatory Visit: Payer: Self-pay

## 2021-12-31 VITALS — BP 126/80 | HR 65 | Ht 62.0 in | Wt 144.4 lb

## 2021-12-31 DIAGNOSIS — N644 Mastodynia: Secondary | ICD-10-CM | POA: Diagnosis not present

## 2021-12-31 DIAGNOSIS — R0789 Other chest pain: Secondary | ICD-10-CM

## 2021-12-31 NOTE — Patient Instructions (Addendum)
Good to see you. ? ?Con't Physical Therapy and your home exercises. ? ?Let us know if you haven't heard about scheduling your mammogram in the next week. ? ?Follow-up: 6 weeks ?

## 2021-12-31 NOTE — Progress Notes (Signed)
? ?  I, [name], LAT, ATC, am serving as scribe for Dr. [name]. ? ?[name] is a 87 y.o. female who presents to [facility] today for f/u R-sided chest pain above the level of the R nipple. Pt was last seen by Dr. Georgina Snell on 12/10/21 and a mammogram was ordered and pt was advised to use Tylenol and Gabapentin, and was referred to PT, completing 1 visit. Today, pt reports that her chest is feeling better.  She states that her pain is intermittent in nature w/ no known aggravating factors.  She has more PT appts scheduled.  She reports R-sided breast and sternum pain and also notes some R post scapular pain. ? ?Dx testing: R mammogram (ordered but never obtained) ?12/10/21 T-spine & R-rib XR  ?03/13/21 Chest XR ?10/08/21 EKG ?03/22/20 Chest XR ?  ?Pertinent review of systems: No fevers or chills ? ?Relevant historical information: Diabetes  ? ? ?Exam:  ?BP 126/80 (BP Location: Right Arm, Patient Position: Sitting, Cuff Size: Normal)   Pulse 65   Ht '5\' 2"'$  (1.575 m)   Wt 144 lb 6.4 oz (65.5 kg)   SpO2 94%   BMI 26.41 kg/m?  ?General: Well Developed, well nourished, and in no acute distress.  ? ?MSK: Chest wall: Anterior chest wall tender palpation, posterior chest wall is tender to palpation mildly.  Normal arm motion. ? ? ? ?Lab and Radiology Results ?EXAM: ?RIGHT RIBS AND CHEST - 3+ VIEW ?  ?COMPARISON:  03/30/2021 ?  ?FINDINGS: ?Transverse diameter of heart is slightly increased. Lung fields are ?clear of any infiltrates or pulmonary edema. There is no pleural ?effusion or pneumothorax. No displaced fractures are seen in the ?right ribs. Surgical clips are seen in the right upper quadrant. ?  ?IMPRESSION: ?No displaced fractures are seen in the right ribs. No active disease ?is seen in the chest. ?  ?  ?Electronically Signed ?  By: Elmer Picker M.D. ?  On: 12/11/2021 20:03 ?  ?I, [name], personally (independently) visualized and performed the interpretation of the  images attached in this note. ? ? ? ? ? ?Assessment and Plan: ?87 y.o. female with chest wall pain and right breast pain.  Thought to be muscular in origin.  Patient has responded to 1 physical therapy session.  She has more scheduled and I think that would be helpful to attend more.  She is a little confused about when they are so we showed her on the after visit summary the date and times of her future physical therapy appointments.  I encouraged her to attend them. ? ?Additionally I ordered a diagnostic mammogram at the last visit which she has not yet scheduled.  Did not think that she had been called to schedule the diagnostic mammogram.  I contacted [facility] imaging and confirmed that yes she was called twice but does not have a voicemail box.  I provided her with the direct phone number to call and schedule her mammogram herself.  I think it is important that a diagnostic mammogram be done in the situation. ? ?Check back with me in 6 weeks. ? ? ? ?Discussed warning signs or symptoms. Please see discharge instructions. Patient expresses understanding. ? ? ?The above documentation has been reviewed and is accurate and complete [name], M.D. ? ? ?

## 2022-01-01 ENCOUNTER — Encounter (HOSPITAL_BASED_OUTPATIENT_CLINIC_OR_DEPARTMENT_OTHER): Payer: Self-pay | Admitting: Emergency Medicine

## 2022-01-01 ENCOUNTER — Emergency Department (HOSPITAL_BASED_OUTPATIENT_CLINIC_OR_DEPARTMENT_OTHER): Payer: Medicare PPO

## 2022-01-01 ENCOUNTER — Emergency Department (HOSPITAL_BASED_OUTPATIENT_CLINIC_OR_DEPARTMENT_OTHER)
Admission: EM | Admit: 2022-01-01 | Discharge: 2022-01-01 | Disposition: A | Discharge status: Home / Self Care | Payer: Medicare PPO | Attending: Emergency Medicine | Admitting: Emergency Medicine

## 2022-01-01 DIAGNOSIS — I10 Essential (primary) hypertension: Secondary | ICD-10-CM | POA: Diagnosis not present

## 2022-01-01 DIAGNOSIS — R0789 Other chest pain: Secondary | ICD-10-CM | POA: Diagnosis not present

## 2022-01-01 DIAGNOSIS — E119 Type 2 diabetes mellitus without complications: Secondary | ICD-10-CM | POA: Insufficient documentation

## 2022-01-01 DIAGNOSIS — R079 Chest pain, unspecified: Secondary | ICD-10-CM | POA: Diagnosis not present

## 2022-01-01 DIAGNOSIS — Z79899 Other long term (current) drug therapy: Secondary | ICD-10-CM | POA: Diagnosis not present

## 2022-01-01 DIAGNOSIS — Z794 Long term (current) use of insulin: Secondary | ICD-10-CM | POA: Insufficient documentation

## 2022-01-01 DIAGNOSIS — Z7901 Long term (current) use of anticoagulants: Secondary | ICD-10-CM | POA: Insufficient documentation

## 2022-01-01 LAB — COMPREHENSIVE METABOLIC PANEL
ALT: 69 U/L — ABNORMAL HIGH (ref 0–44)
AST: 54 U/L — ABNORMAL HIGH (ref 15–41)
Albumin: 4.5 g/dL (ref 3.5–5.0)
Alkaline Phosphatase: 57 U/L (ref 38–126)
Anion gap: 11 (ref 5–15)
BUN: 23 mg/dL (ref 8–23)
CO2: 23 mmol/L (ref 22–32)
Calcium: 9.5 mg/dL (ref 8.9–10.3)
Chloride: 98 mmol/L (ref 98–111)
Creatinine, Ser: 1.35 mg/dL — ABNORMAL HIGH (ref 0.44–1.00)
GFR, Estimated: 39 mL/min — ABNORMAL LOW (ref >60)
Glucose, Bld: 189 mg/dL — ABNORMAL HIGH (ref 70–99)
Potassium: 3.7 mmol/L (ref 3.5–5.1)
Sodium: 132 mmol/L — ABNORMAL LOW (ref 135–145)
Total Bilirubin: 1 mg/dL (ref 0.3–1.2)
Total Protein: 7.8 g/dL (ref 6.5–8.1)

## 2022-01-01 LAB — CBC
HCT: 34.5 % — ABNORMAL LOW (ref 36.0–46.0)
Hemoglobin: 11.5 g/dL — ABNORMAL LOW (ref 12.0–15.0)
MCH: 28.8 pg (ref 26.0–34.0)
MCHC: 33.3 g/dL (ref 30.0–36.0)
MCV: 86.5 fL (ref 80.0–100.0)
Platelets: 242 10*3/uL (ref 150–400)
RBC: 3.99 MIL/uL (ref 3.87–5.11)
RDW: 14.2 % (ref 11.5–15.5)
WBC: 5.1 10*3/uL (ref 4.0–10.5)
nRBC: 0 % (ref 0.0–0.2)

## 2022-01-01 LAB — TROPONIN I (HIGH SENSITIVITY)
Troponin I (High Sensitivity): 4 ng/L (ref <18)
Troponin I (High Sensitivity): 5 ng/L (ref <18)

## 2022-01-01 MED ORDER — OXYCODONE HCL 5 MG PO TABS
5.0000 mg | ORAL_TABLET | Freq: Once | ORAL | Status: AC
Start: 2022-01-01 — End: 2022-01-01
  Administered 2022-01-01: 5 mg via ORAL
  Filled 2022-01-01: qty 1

## 2022-01-01 MED ORDER — LIDOCAINE 5 % EX PTCH: 1.0000 | MEDICATED_PATCH | Freq: Every day | CUTANEOUS | 0 refills | Status: DC | PRN

## 2022-01-01 MED ORDER — METHOCARBAMOL 500 MG PO TABS: 500.0000 mg | ORAL_TABLET | Freq: Two times a day (BID) | ORAL | 0 refills | Status: DC

## 2022-01-01 MED ORDER — LIDOCAINE 5 % EX PTCH
1.0000 | MEDICATED_PATCH | CUTANEOUS | Status: DC
  Administered 2022-01-01: 1 via TRANSDERMAL
  Filled 2022-01-01: qty 1

## 2022-01-01 MED ORDER — IBUPROFEN 400 MG PO TABS
600.0000 mg | ORAL_TABLET | Freq: Once | ORAL | Status: AC
  Administered 2022-01-01: 600 mg via ORAL
  Filled 2022-01-01: qty 1

## 2022-01-01 MED ORDER — ACETAMINOPHEN-CODEINE #3 300-30 MG PO TABS
1.0000 | ORAL_TABLET | Freq: Four times a day (QID) | ORAL | 0 refills | Status: DC | PRN
Start: 2022-01-01 — End: 2022-01-27

## 2022-01-01 MED ORDER — HYDROCODONE-ACETAMINOPHEN 5-325 MG PO TABS
1.0000 | ORAL_TABLET | Freq: Once | ORAL | Status: DC
  Filled 2022-01-01: qty 1

## 2022-01-01 NOTE — Discharge Instructions (Addendum)

## 2022-01-01 NOTE — ED Notes (Signed)
Pt ambulatory to waiting room. Pt verbalized understanding of discharge instructions.   

## 2022-01-01 NOTE — ED Triage Notes (Signed)
Pt reports right sided chest pain that started 3 weeks ago after "twisting while moving a mattress." Pt reports using a heating pad helps the pain.  ?

## 2022-01-01 NOTE — ED Provider Notes (Signed)
?[facility] ?Provider Note ? ? ?CSN: [id number] ?Arrival date & time: 01/01/22  6378 ? ?  ? ?History ? ?Chief Complaint  ?Patient presents with  ? Chest Pain  ? ? ?[name] is a 87 y.o. female. ? ?This is a 87 y.o. female  with significant medical history as below, including dm, hld, htn who presents to the ED with complaint of chest wall pain ? ?Location:  right side chest wall ?Duration:  3 wks ?Onset:  sudden ?Timing:  intermittent ?Description:  pulling aching ?Severity:  mild ?Exacerbating/Alleviating Factors:  worse with torso twisting, palpation ?Associated Symptoms:  none ?Pertinent Negatives:  no dib, no n/v, no syncope, no neuro changes ?Context: pt was moving a mattress aroudn 3 wks ago, felt a pulling sensation when she twisted and has been having pain ever since, has seen sports med x2, unsure if she is taking the gabapentin that she was prescribed but feels medications are not working well for her. ? ? ? ?Past Medical History: ?No date: Colon polyp ?No date: Diabetes mellitus ?No date: Hyperlipidemia ?No date: Hypertension ?No date: Poor appetite ?No date: Thyroid disease ? ?Past Surgical History: ?No date: BREAST EXCISIONAL BIOPSY; Right ?07/15/2006: CARDIOVASCULAR STRESS TEST ?    Comment:  Normal scan, no ECG changes ?01/08/2011: TRANSTHORACIC ECHOCARDIOGRAM ?    Comment:  EF 60-65%, moderae mitral regurg, LA mild-moderately  ?             dilated,  ? ? ?The history is provided by the patient and a relative. No language interpreter was used.  ?Chest Pain ?Associated symptoms: no abdominal pain, no cough, no dysphagia, no fever, no headache, no nausea, no palpitations, no shortness of breath and no vomiting   ? ?  ? ?Home Medications ?Prior to Admission medications   ?Medication Sig Start Date End Date Taking? Authorizing Provider  ?acetaminophen-codeine (TYLENOL #3) 300-30 MG tablet Take 1 tablet by mouth every 6 (six) hours as needed for moderate pain. 01/01/22  Yes  Wynona Dove A, DO  ?lidocaine (LIDODERM) 5 % Place 1 patch onto the skin daily as needed. Remove & Discard patch within 12 hours or as directed by MD 01/01/22  Yes Jeanell Sparrow, DO  ?methocarbamol (ROBAXIN) 500 MG tablet Take 1 tablet (500 mg total) by mouth 2 (two) times daily. 01/01/22  Yes Jeanell Sparrow, DO  ?apixaban (ELIQUIS) 2.5 MG TABS tablet Take 1 tablet (2.5 mg total) by mouth 2 (two) times daily. 12/18/20   Hilty, Nadean Corwin, MD  ?atorvastatin (LIPITOR) 20 MG tablet Take 20 mg by mouth daily. 02/01/20   [provider]  ?furosemide (LASIX) 20 MG tablet Take 20 mg by mouth daily as needed for edema. 03/30/21   [provider]  ?gabapentin (NEURONTIN) 100 MG capsule Take 1 capsule (100 mg total) by mouth 3 (three) times daily. 12/10/21   Gregor Hams, MD  ?insulin aspart (NOVOLOG FLEXPEN) 100 UNIT/ML FlexPen Inject 8 Units into the skin 3 (three) times daily with meals. 09/04/21   Mariel Aloe, MD  ?insulin glargine, 2 Unit Dial, (TOUJEO MAX SOLOSTAR) 300 UNIT/ML Solostar Pen Inject 12 Units into the skin daily. 09/04/21   Mariel Aloe, MD  ?levothyroxine (SYNTHROID) 75 MCG tablet Take 75 mcg by mouth daily before breakfast. 03/09/21   [provider]  ?metoprolol succinate (TOPROL XL) 25 MG 24 hr tablet Take 1 tablet (25 mg total) by mouth daily. 10/06/21   Pixie Casino, MD  ?  olmesartan (BENICAR) 20 MG tablet Take 0.5 tablets (10 mg total) by mouth daily. 09/04/21   Mariel Aloe, MD  ?TIADYLT ER 180 MG 24 hr capsule TAKE 1 CAPSULE BY MOUTH EVERY DAY 11/27/21   Hilty, Nadean Corwin, MD  ?   ? ?Allergies    ?Patient has no known allergies.   ? ?Review of Systems   ?Review of Systems  ?Constitutional:  Negative for chills and fever.  ?HENT:  Negative for facial swelling and trouble swallowing.   ?Eyes:  Negative for photophobia and visual disturbance.  ?Respiratory:  Negative for cough and shortness of breath.   ?Cardiovascular:  Positive for chest pain. Negative for  palpitations.  ?Gastrointestinal:  Negative for abdominal pain, nausea and vomiting.  ?Endocrine: Negative for polydipsia and polyuria.  ?Genitourinary:  Negative for difficulty urinating and hematuria.  ?Musculoskeletal:  Negative for gait problem and joint swelling.  ?Skin:  Negative for pallor and rash.  ?Neurological:  Negative for syncope and headaches.  ?Psychiatric/Behavioral:  Negative for agitation and confusion.   ? ?Physical Exam ?Updated Vital Signs ?BP 133/66   Pulse 62   Temp 98.5 ?F (36.9 ?C) (Oral)   Resp (!) 22   SpO2 99%  ?Physical Exam ?Vitals and nursing note reviewed.  ?Constitutional:   ?   General: She is not in acute distress. ?   Appearance: Normal appearance. She is well-developed. She is not toxic-appearing or diaphoretic.  ?HENT:  ?   Head: Normocephalic and atraumatic.  ?   Right Ear: External ear normal.  ?   Left Ear: External ear normal.  ?   Nose: Nose normal.  ?   Mouth/Throat:  ?   Mouth: Mucous membranes are moist.  ?Eyes:  ?   General: No scleral icterus.    ?   Right eye: No discharge.     ?   Left eye: No discharge.  ?Neck:  ?   Vascular: No JVD.  ?Cardiovascular:  ?   Rate and Rhythm: Normal rate and regular rhythm.  ?   Pulses: Normal pulses.  ?   Heart sounds: Normal heart sounds. No murmur heard. ?Pulmonary:  ?   Effort: Pulmonary effort is normal. No respiratory distress.  ?   Breath sounds: Normal breath sounds.  ?Chest:  ? ? ?Abdominal:  ?   General: Abdomen is flat.  ?   Tenderness: There is no abdominal tenderness.  ?Musculoskeletal:     ?   General: Normal range of motion.  ?   Cervical back: Normal range of motion.  ?   Right lower leg: No edema.  ?   Left lower leg: No edema.  ?Skin: ?   General: Skin is warm and dry.  ?   Capillary Refill: Capillary refill takes less than 2 seconds.  ?Neurological:  ?   Mental Status: She is alert.  ?Psychiatric:     ?   Mood and Affect: Mood normal.     ?   Behavior: Behavior normal.  ? ? ?ED Results / Procedures / Treatments    ?Labs ?(all labs ordered are listed, but only abnormal results are displayed) ?Labs Reviewed  ?CBC - Abnormal; Notable for the following components:  ?    Result Value  ? Hemoglobin 11.5 (*)   ? HCT 34.5 (*)   ? All other components within normal limits  ?COMPREHENSIVE METABOLIC PANEL - Abnormal; Notable for the following components:  ? Sodium 132 (*)   ? Glucose, Bld 189 (*)   ?  Creatinine, Ser 1.35 (*)   ? AST 54 (*)   ? ALT 69 (*)   ? GFR, Estimated 39 (*)   ? All other components within normal limits  ?TROPONIN I (HIGH SENSITIVITY)  ?TROPONIN I (HIGH SENSITIVITY)  ? ? ?EKG ?EKG Interpretation ? ?Date/Time:  Friday January 01 2022 09:38:28 EDT ?Ventricular Rate:  51 ?PR Interval:  198 ?QRS Duration: 108 ?QT Interval:  507 ?QTC Calculation: 467 ?R Axis:   -25 ?Text Interpretation: Sinus rhythm Borderline left axis deviation Low voltage, precordial leads Borderline T wave abnormalities similar to prior Confirmed by Wynona Dove ([id number]) on 01/01/2022 10:29:19 AM ? ?Radiology ?DG Chest 2 View ? ?Result Date: 01/01/2022 ?CLINICAL DATA:  Chest pain EXAM: CHEST - 2 VIEW COMPARISON:  12/10/2021 FINDINGS: The heart size and mediastinal contours are within normal limits. No focal airspace consolidation, pleural effusion, or pneumothorax. IMPRESSION: No active cardiopulmonary disease. Electronically Signed   By: Davina Poke D.O.   On: 01/01/2022 10:23   ? ?Procedures ?Procedures  ? ? ?Medications Ordered in ED ?Medications  ?lidocaine (LIDODERM) 5 % 1 patch (1 patch Transdermal Patch Applied 01/01/22 1121)  ?ibuprofen (ADVIL) tablet 600 mg (600 mg Oral Given 01/01/22 1120)  ?oxyCODONE (Oxy IR/ROXICODONE) immediate release tablet 5 mg (5 mg Oral Given 01/01/22 1135)  ? ? ?ED Course/ Medical Decision Making/ A&P ?  ?                        ?Medical Decision Making ?Amount and/or Complexity of Data Reviewed ?Labs: ordered. ?Radiology: ordered. ? ?Risk ?Prescription drug management. ? ? ? ?CC: cp ? ?This patient presents to the  Emergency Department for the above complaint. This involves an extensive number of treatment options and is a complaint that carries with it a high risk of complications and morbidity. Vital signs were reviewed. Serio

## 2022-01-04 ENCOUNTER — Other Ambulatory Visit: Payer: Self-pay | Admitting: Internal Medicine

## 2022-01-04 NOTE — Telephone Encounter (Signed)
Prescription refill request for Eliquis received. ?Indication:Afib ?Last office visit:12/22 ?Scr:1.3 ?Age: 87 ?Weight:65.5 kg ? ?Prescription refilled ? ?

## 2022-01-05 ENCOUNTER — Telehealth: Payer: Self-pay | Admitting: Pharmacist

## 2022-01-05 NOTE — Telephone Encounter (Signed)
[facility] is currently conducting an anticoagulation clinical trial for atrial fibrillation. The trial is sponsored by [employer] and is called Atrial Fibrillation Stroke & Embolism Prevention Trial (OCEANIC-AF) The trial is comparing the new Factor Pollyann Samples inhibitor Cecille Rubin) to Eliquis and assessing outcomes with bleeding and stroke risk (similar to ARISTOTLE and ROCKET AF, but their direct comparator was warfarin).  We are doing the study with [facility] and Dr Adrian Prows, MD, [facility] is the primary investigator.  Please don't hesistate to reach out to [place] if you have further questions [phone]. ?

## 2022-01-07 ENCOUNTER — Encounter: Payer: Medicare PPO | Admitting: Physical Therapy

## 2022-01-07 ENCOUNTER — Telehealth: Payer: Self-pay | Admitting: Physical Therapy

## 2022-01-07 NOTE — Telephone Encounter (Signed)
Attempted to call pt due to missed PT appt.  Unable to leave message. ? ?Laureen Abrahams, PT, DPT ?01/07/22 9:51 AM ? ?

## 2022-01-07 NOTE — Therapy (Incomplete)
?OUTPATIENT PHYSICAL THERAPY TREATMENT NOTE ? ? ?Patient Name: [name] ?MRN: [id number] ?DOB:[birth year], 87 y.o., female ?Today's Date: 01/07/2022 ? ?PCP: Deland Pretty, MD ?REFERRING PROVIDER: Gregor Hams, MD ? ? ? ?Past Medical History:  ?Diagnosis Date  ? Colon polyp   ? Diabetes mellitus   ? Hyperlipidemia   ? Hypertension   ? Poor appetite   ? Thyroid disease   ? ?Past Surgical History:  ?Procedure Laterality Date  ? BREAST EXCISIONAL BIOPSY Right   ? CARDIOVASCULAR STRESS TEST  07/15/2006  ? Normal scan, no ECG changes  ? TRANSTHORACIC ECHOCARDIOGRAM  01/08/2011  ? EF 60-65%, moderae mitral regurg, LA mild-moderately dilated,  ? ?Patient Active Problem List  ? Diagnosis Date Noted  ? COVID-19 virus infection 03/13/2021  ? DKA (diabetic ketoacidosis) ([place]) 03/13/2021  ? Hyperglycemia 03/23/2020  ? Uncontrolled type 2 diabetes mellitus with hyperglycemia ([place]) 10/26/2018  ? Hypercholesterolemia 03/15/2018  ? Atrial fibrillation with RVR ([place]) 08/18/2017  ? Left hip pain 06/07/2017  ? Hypothyroidism 02/03/2015  ? Essential hypertension 07/10/2013  ? Type 2 diabetes mellitus ([place]) 07/10/2013  ? Upper respiratory infection 07/10/2013  ? PAF (paroxysmal atrial fibrillation) ([place]) 12/26/2012  ? Postop check 04/13/2011  ? ? ?REFERRING DIAG: R07.89 (ICD-10-CM) - Other chest pain ? ?THERAPY DIAG:  ?No diagnosis found. ? ?PERTINENT HISTORY: Hyperlipidemia, DM, thryoid disease, HTN ? ?PRECAUTIONS: None ? ?SUBJECTIVE: *** ? ?PAIN:  ?Are you having pain? {OPRCPAIN:[id number]} ? ? ? ? ? ?OBJECTIVE:  ?  ?PATIENT SURVEYS:  ?12/24/2021: FOTO intake 49, predicted 61 ?  ?POSTURE: ?12/24/2021: Rounded shoulder, mild increased thoracic kyphosis c forward head posture.  ?  ?THORACIC ROM: ?12/24/2021:  unrestricted ?  ?UPPER EXTREMITY ROM:  ?  ?Active ROM Right ?12/24/2021 Left ?12/24/2021  ?Shoulder flexion AROM against gravity 125 degrees ?  ?Pain noted throughout range, increased c increased amount    ?Shoulder extension      ?Shoulder  abduction      ?Shoulder adduction      ?Shoulder internal rotation WFL    ?Shoulder external rotation WFL    ?(Blank rows = not tested) ?  ?UPPER EXTREMITY MMT: ?  ?MMT Right ?12/24/2021 Left ?12/24/2021  ?Shoulder flexion 5/5 5/5  ?Shoulder extension      ?Shoulder abduction 3+/5 c pain produced 5/5  ?Shoulder adduction      ?Shoulder internal rotation 5/5 5/5  ?Shoulder external rotation 5/5 5/5  ?Elbow flexion 5/5 5/5  ?Elbow extension 5/5 5/5  ?(Blank rows = not tested) ?  ?PALPATION:  ?12/24/2021: Palpation tenderness along pec major Rt muscle and tendon throughout chest wall into attachment to Rt humerus.  Trigger points and increased tightness noted in palpation.  Tenderness along medial aspect of Rt clavicle, Sternum.  ?            ?TODAY'S TREATMENT:  ?01/07/22 ?*** ? ?12/24/2021: ?            Therex:          HEP instruction/performance c cues for techniques, handout provided.  Trial set performed of each for comprehension and symptom assessment.  See below for exercise list. ?  ?  ?PATIENT EDUCATION: ?12/24/2021: ?Education details: HEP, POC ?Person educated: Patient ?Education method: Explanation, Demonstration, Verbal cues, and Handouts ?Education comprehension: verbalized understanding and returned demonstration ?  ?  ?HOME EXERCISE PROGRAM: ?12/24/2021: ?Access Code: [email or web address] ?URL: [email or web address] ?Date: 12/24/2021 ?Prepared by: Scot Jun ?  ?Exercises ?Shoulder External Rotation and Scapular Retraction with Resistance - 2-3 x daily -  7 x weekly - 1 sets - 10 reps - 3-5 hold ?[place] Major Stretch - 2-3 x daily - 7 x weekly - 1 sets - 5 reps - 15 hold ?Isometric horizontal adduction 10 sec x 5x 2-3 day ?  ?ASSESSMENT: ?  ?CLINICAL IMPRESSION: ?Patient is a 87 y.o. who comes to clinic with complaints of Rt chest wall/axilla pain with mobility, strength deficits that impair their ability to perform usual daily and recreational functional activities without increase  difficulty/symptoms at this time.  Patient to benefit from skilled PT services to address impairments and limitations to improve to previous level of function without restriction secondary to condition.  ?  ?  ?OBJECTIVE IMPAIRMENTS decreased activity tolerance, decreased endurance, decreased mobility, decreased ROM, decreased strength, impaired perceived functional ability, increased muscle spasms, impaired UE functional use, improper body mechanics, postural dysfunction, and pain.  ?  ?ACTIVITY LIMITATIONS cleaning, driving, meal prep, occupation, and laundry.  ?  ?PERSONAL FACTORS Hyperlipidemia, DM, thryoid disease, HTN are also affecting patient's functional outcome.  ?  ?  ?REHAB POTENTIAL: Good ?  ?CLINICAL DECISION MAKING: Stable/uncomplicated ?  ?EVALUATION COMPLEXITY: Low ?  ?  ?GOALS: ?Goals reviewed with patient? Yes ?  ?Short term PT Goals (target date for Short term goals are 3 weeks 01/14/2022) ?Patient will demonstrate independent use of home exercise program to maintain progress from in clinic treatments. ?Goal status: New ?  ?Long term PT goals (target dates for all long term goals are 6 weeks  02/04/2022 ) ?Patient will demonstrate/report pain at worst less than or equal to 2/10 to facilitate minimal limitation in daily activity secondary to pain symptoms. ?Goal status: New ?  ?Patient will demonstrate independent use of home exercise program to facilitate ability to maintain/progress functional gains from skilled physical therapy services. ?Goal status: New ?  ?Patient will demonstrate FOTO outcome > or = 61 % to indicate reduced disability due to condition. ?Goal status: New ?  ?Patient will demonstrate Rt UE MMT 5/5 throughout to facilitate usual lifting, carrying in functional activity to PLOF s limitation. ?Goal status: New ?  ?    5.  Patient will demonstrate Rt [place] joint mobility WFL to facilitate usual self care, dressing, reaching overhead at PLOF s limitation due to symptoms.  ? Goal status:  New ?  ?    6.  Patient will demonstrate/report ability to sleep s restriction.  ? Goal status: New ?  ?  ?  ?PLAN: ?PT FREQUENCY: 1-2x/week ?  ?PT DURATION: 6 weeks ?  ?PLANNED INTERVENTIONS: Therapeutic exercises, Therapeutic activity, Neuro Muscular re-education, Balance training, Gait training, Patient/Family education, Joint mobilization, Stair training, DME instructions, Dry Needling, Electrical stimulation, Cryotherapy, Moist heat, Taping, Ultrasound, Ionotophoresis '4mg'$ /ml Dexamethasone, and Manual therapy.  All included unless contraindicated ?  ?PLAN FOR NEXT SESSION:  *** Review existing HEP knowledge.  ? ? ? ?Faustino Congress, PT ?01/07/2022, 7:27 AM ? ?  ? ?

## 2022-01-08 ENCOUNTER — Ambulatory Visit: Payer: Medicare PPO | Admitting: Internal Medicine

## 2022-01-12 ENCOUNTER — Encounter: Payer: Medicare PPO | Admitting: Rehabilitative and Restorative Service Providers"

## 2022-01-12 ENCOUNTER — Telehealth: Payer: Self-pay | Admitting: Rehabilitative and Restorative Service Providers"

## 2022-01-12 NOTE — Telephone Encounter (Signed)
No show follow up call.  Unable to leave message.  Pt has one more visit scheduled.  Plan to d/c if not attending that visit.  ? ?Scot Jun, PT, DPT, OCS, ATC ?01/12/22  2:49 PM ? ? ?

## 2022-01-13 DIAGNOSIS — Z794 Long term (current) use of insulin: Secondary | ICD-10-CM | POA: Diagnosis not present

## 2022-01-13 DIAGNOSIS — E785 Hyperlipidemia, unspecified: Secondary | ICD-10-CM | POA: Diagnosis not present

## 2022-01-13 DIAGNOSIS — M2550 Pain in unspecified joint: Secondary | ICD-10-CM | POA: Diagnosis not present

## 2022-01-13 DIAGNOSIS — I4891 Unspecified atrial fibrillation: Secondary | ICD-10-CM | POA: Diagnosis not present

## 2022-01-13 DIAGNOSIS — E1165 Type 2 diabetes mellitus with hyperglycemia: Secondary | ICD-10-CM | POA: Diagnosis not present

## 2022-01-13 DIAGNOSIS — I10 Essential (primary) hypertension: Secondary | ICD-10-CM | POA: Diagnosis not present

## 2022-01-13 DIAGNOSIS — R609 Edema, unspecified: Secondary | ICD-10-CM | POA: Diagnosis not present

## 2022-01-13 DIAGNOSIS — E1151 Type 2 diabetes mellitus with diabetic peripheral angiopathy without gangrene: Secondary | ICD-10-CM | POA: Diagnosis not present

## 2022-01-13 DIAGNOSIS — D6869 Other thrombophilia: Secondary | ICD-10-CM | POA: Diagnosis not present

## 2022-01-16 ENCOUNTER — Other Ambulatory Visit: Payer: Self-pay

## 2022-01-16 ENCOUNTER — Emergency Department (HOSPITAL_BASED_OUTPATIENT_CLINIC_OR_DEPARTMENT_OTHER)
Admission: EM | Admit: 2022-01-16 | Discharge: 2022-01-16 | Disposition: A | Discharge status: Home / Self Care | Payer: Medicare PPO | Attending: Emergency Medicine | Admitting: Emergency Medicine

## 2022-01-16 ENCOUNTER — Encounter (HOSPITAL_BASED_OUTPATIENT_CLINIC_OR_DEPARTMENT_OTHER): Payer: Self-pay

## 2022-01-16 DIAGNOSIS — Z7901 Long term (current) use of anticoagulants: Secondary | ICD-10-CM | POA: Insufficient documentation

## 2022-01-16 DIAGNOSIS — M546 Pain in thoracic spine: Secondary | ICD-10-CM | POA: Insufficient documentation

## 2022-01-16 DIAGNOSIS — I4891 Unspecified atrial fibrillation: Secondary | ICD-10-CM | POA: Diagnosis not present

## 2022-01-16 DIAGNOSIS — R0789 Other chest pain: Secondary | ICD-10-CM | POA: Diagnosis not present

## 2022-01-16 DIAGNOSIS — M549 Dorsalgia, unspecified: Secondary | ICD-10-CM

## 2022-01-16 MED ORDER — METHOCARBAMOL 500 MG PO TABS: 500.0000 mg | ORAL_TABLET | Freq: Two times a day (BID) | ORAL | 0 refills | Status: DC

## 2022-01-16 MED ORDER — LIDOCAINE 5 % EX PTCH
1.0000 | MEDICATED_PATCH | CUTANEOUS | Status: DC
  Administered 2022-01-16: 1 via TRANSDERMAL
  Filled 2022-01-16: qty 1

## 2022-01-16 MED ORDER — METHOCARBAMOL 500 MG PO TABS
500.0000 mg | ORAL_TABLET | Freq: Once | ORAL | Status: AC
  Administered 2022-01-16: 500 mg via ORAL
  Filled 2022-01-16: qty 1

## 2022-01-16 MED ORDER — LIDOCAINE 5 % EX PTCH: 1.0000 | MEDICATED_PATCH | CUTANEOUS | 0 refills | Status: DC

## 2022-01-16 NOTE — ED Triage Notes (Signed)
Seen here on 3/24 for muscle strain after moving a mattress 3 weeks prior. Continues to have right back/chest pain since. Pain worse with movement. ?

## 2022-01-16 NOTE — ED Provider Notes (Signed)
?[facility] ?Provider Note ? ? ?CSN: [id number] ?Arrival date & time: 01/16/22  1413 ? ?  ? ?History ? ?Chief Complaint  ?Patient presents with  ? Back Pain  ? ? ?[name] is a 87 y.o. female with history of A-fib on Eliquis who presents to the ED for evaluation of ongoing right-sided back pain that remains from an injury several weeks ago while moving a mattress.  Patient was seen in the ED a couple weeks ago for symptoms and had extensive work-up, including cardiac work-up in case patient's symptoms are atypical cardiac complaints.  All results were normal.  She notes the pain is sharp, worse when she is sleeping.  Aggravated upon movement.  She notes pain radiates around to just under her right chest.  She was discharged home with Robaxin, Tylenol with codeine and lidocaine patches.  She has taken Tylenol with codeine but does not continue to take it as she felt that it made her feel "drunk".  She notes improvement with the muscle relaxer and the lidocaine patch but has since run out.  She states that she has been to physical therapy, but upon chart review it appears that she has no showed the last 2 appointments.  Patient denies chest pain, nausea, vomiting and diarrhea. ? ? ?Back Pain ? ?  ? ?Home Medications ?Prior to Admission medications   ?Medication Sig Start Date End Date Taking? Authorizing Provider  ?lidocaine (LIDODERM) 5 % Place 1 patch onto the skin daily. Remove & Discard patch within 12 hours or as directed by MD 01/16/22  Yes Tonye Pearson, PA-C  ?methocarbamol (ROBAXIN) 500 MG tablet Take 1 tablet (500 mg total) by mouth 2 (two) times daily for 20 days. 01/16/22 02/05/22 Yes Kathe Becton R, PA-C  ?acetaminophen-codeine (TYLENOL #3) 300-30 MG tablet Take 1 tablet by mouth every 6 (six) hours as needed for moderate pain. 01/01/22   Jeanell Sparrow, DO  ?apixaban (ELIQUIS) 2.5 MG TABS tablet TAKE 1 TABLET BY MOUTH TWICE A DAY 01/04/22   Hilty, Nadean Corwin, MD   ?atorvastatin (LIPITOR) 20 MG tablet Take 20 mg by mouth daily. 02/01/20   [provider]  ?furosemide (LASIX) 20 MG tablet Take 20 mg by mouth daily as needed for edema. 03/30/21   [provider]  ?gabapentin (NEURONTIN) 100 MG capsule Take 1 capsule (100 mg total) by mouth 3 (three) times daily. 12/10/21   Gregor Hams, MD  ?insulin aspart (NOVOLOG FLEXPEN) 100 UNIT/ML FlexPen Inject 8 Units into the skin 3 (three) times daily with meals. 09/04/21   Mariel Aloe, MD  ?insulin glargine, 2 Unit Dial, (TOUJEO MAX SOLOSTAR) 300 UNIT/ML Solostar Pen Inject 12 Units into the skin daily. 09/04/21   Mariel Aloe, MD  ?levothyroxine (SYNTHROID) 75 MCG tablet Take 75 mcg by mouth daily before breakfast. 03/09/21   [provider]  ?lidocaine (LIDODERM) 5 % Place 1 patch onto the skin daily as needed. Remove & Discard patch within 12 hours or as directed by MD 01/01/22   Jeanell Sparrow, DO  ?methocarbamol (ROBAXIN) 500 MG tablet Take 1 tablet (500 mg total) by mouth 2 (two) times daily. 01/01/22   Jeanell Sparrow, DO  ?metoprolol succinate (TOPROL XL) 25 MG 24 hr tablet Take 1 tablet (25 mg total) by mouth daily. 10/06/21   Hilty, Nadean Corwin, MD  ?olmesartan (BENICAR) 20 MG tablet Take 0.5 tablets (10 mg total) by mouth daily. 09/04/21   Cordelia Poche  A, MD  ?TIADYLT ER 180 MG 24 hr capsule TAKE 1 CAPSULE BY MOUTH EVERY DAY 11/27/21   Hilty, Nadean Corwin, MD  ?   ? ?Allergies    ?Patient has no known allergies.   ? ?Review of Systems   ?Review of Systems  ?Musculoskeletal:  Positive for back pain.  ? ?Physical Exam ?Updated Vital Signs ?BP 132/78   Pulse 67   Temp 97.8 ?F (36.6 ?C) (Oral)   Resp 18   Ht '5\' 2"'$  (1.575 m)   Wt 64.9 kg   SpO2 97%   BMI 26.16 kg/m?  ?Physical Exam ?Vitals and nursing note reviewed.  ?Constitutional:   ?   General: She is not in acute distress. ?   Appearance: She is not ill-appearing.  ?HENT:  ?   Head: Atraumatic.  ?Eyes:  ?   Conjunctiva/sclera: Conjunctivae  normal.  ?Cardiovascular:  ?   Rate and Rhythm: Normal rate and regular rhythm.  ?   Pulses: Normal pulses.  ?   Heart sounds: No murmur heard. ?Pulmonary:  ?   Effort: Pulmonary effort is normal. No respiratory distress.  ?   Breath sounds: Normal breath sounds.  ?Abdominal:  ?   General: Abdomen is flat. There is no distension.  ?   Palpations: Abdomen is soft.  ?   Tenderness: There is no abdominal tenderness.  ?Musculoskeletal:     ?   General: Normal range of motion.  ?     Arms: ? ?   Cervical back: Normal range of motion.  ?Skin: ?   General: Skin is warm and dry.  ?   Capillary Refill: Capillary refill takes less than 2 seconds.  ?Neurological:  ?   General: No focal deficit present.  ?   Mental Status: She is alert.  ?Psychiatric:     ?   Mood and Affect: Mood normal.  ? ? ?ED Results / Procedures / Treatments   ?Labs ?(all labs ordered are listed, but only abnormal results are displayed) ?Labs Reviewed - No data to display ? ?EKG ?None ? ?Radiology ?No results found. ? ?Procedures ?Procedures  ?Medications Ordered in ED ?Medications  ?lidocaine (LIDODERM) 5 % 1 patch (1 patch Transdermal Patch Applied 01/16/22 1621)  ?methocarbamol (ROBAXIN) tablet 500 mg (500 mg Oral Given 01/16/22 1621)  ? ? ?ED Course/ Medical Decision Making/ A&P ?  ?                        ?Medical Decision Making ?Risk ?Prescription drug management. ? ? ?History:  ?Per HPI ?Social determinants of health: none ? ?Initial impression: ? ?This patient presents to the ED for concern of right back pain, this involves an extensive number of treatment options, and is a complaint that carries with it a high risk of complications and morbidity.    ? ? ?Cardiac Monitoring: ? ?The patient was maintained on a cardiac monitor.  I personally viewed and interpreted the cardiac monitored which showed an underlying rhythm of: nsr ? ? ?Medicines ordered and prescription drug management: ? ?I ordered medication including: ?Lidocaine patch ?robaxin    ?Reevaluation of the patient after these medicines showed that the patient improved ?I have reviewed the patients home medicines and have made adjustments as needed ? ? ?ED Course: ?Overall well-appearing 87 year old woman in no acute distress.  Nontoxic-appearing.  Her vitals are all normal.  Physical exam significant for focal, reproducible tenderness of the right shoulder blade.  Patient had  extensive work-up done 2 weeks ago to rule out underlying cardiac pathology for the pain that radiated around to her chest.  All was normal.  Pain is the same and unchanged since then so I do not think additional work-up is indicated at this time.  Patient symptoms likely continued from unresolved pulled muscle.  Continue conservative therapy with outpatient follow-up. ? ?Disposition: ? ?After consideration of the diagnostic results, physical exam, history and the patients response to treatment feel that the patent would benefit from discharge.   ?Right upper back pain: I have given patient a refill on the muscle relaxer and lidocaine patches that she can use for pain since she has had improvement.  She notes that Tylenol does not seem to work for her and Tylenol with codeine does not make her feel very well.  She notes that with her medical history, she is advised not to take NSAIDs.  Since she has significant improvement with the lidocaine and muscle relaxer, advised her to just take these instead.  If she does need additional pain management, she can consult with her PCP who knows her history and will be able to refill medications.  Patient amenable to plan.  Discharged home in good condition. ? ?Final Clinical Impression(s) / ED Diagnoses ?Final diagnoses:  ?Upper back pain on right side  ? ? ?Rx / DC Orders ?ED Discharge Orders   ? ?      Ordered  ?  methocarbamol (ROBAXIN) 500 MG tablet  2 times daily       ? 01/16/22 1551  ?  lidocaine (LIDODERM) 5 %  Every 24 hours       ? 01/16/22 1551  ? ?  ?  ? ?  ? ? ?  ?Tonye Pearson, [place] ?01/16/22 1705 ? ?  ?Hayden Rasmussen, MD ?01/17/22 [phone] ? ?

## 2022-01-16 NOTE — Discharge Instructions (Signed)
ThisI sent you home with additional prescriptions for lidocaine patches and the muscle relaxer which you say have worked well for you.  If you feel the Tylenol with codeine is making you feel off,.  Since you are unable to use medications such as ibuprofen, I recommend following up with your PCP for additional medication recommendations at this time.  Please continue going to your physical therapy appointments and doing your exercises which can help improve your pain.  I hope you feel better soon. ?

## 2022-01-18 ENCOUNTER — Telehealth: Payer: Self-pay | Admitting: Internal Medicine

## 2022-01-18 NOTE — Telephone Encounter (Signed)
Pt c/o medication issue: ? ?1. Name of Medication: apixaban (ELIQUIS) 2.5 MG TABS tablet ? ?2. How are you currently taking this medication (dosage and times per day)? TAKE 1 TABLET BY MOUTH TWICE A DAY ? ?3. Are you having a reaction (difficulty breathing--STAT)? No ? ?4. What is your medication issue? Pt states that that [facility] that she is in is wanting to know if she can stop taking the above medication. Pt states that she has app to go to this program today at 1:30 and would like a call back before than if possible. Please adviser ?

## 2022-01-18 NOTE — Telephone Encounter (Signed)
Spoke to patient she stated Heart Failure program wants to change her Eliquis to a different blood thinner.She wanted Dr.Hilty's advice.Advised Dr.Hilty is out of office this week.I will send message to him for advice. ?

## 2022-01-20 DIAGNOSIS — E1065 Type 1 diabetes mellitus with hyperglycemia: Secondary | ICD-10-CM | POA: Diagnosis not present

## 2022-01-21 ENCOUNTER — Encounter: Payer: Medicare PPO | Admitting: Rehabilitative and Restorative Service Providers"

## 2022-01-25 ENCOUNTER — Emergency Department (HOSPITAL_COMMUNITY): Payer: Medicare PPO

## 2022-01-25 ENCOUNTER — Encounter (HOSPITAL_COMMUNITY): Payer: Self-pay

## 2022-01-25 ENCOUNTER — Inpatient Hospital Stay (HOSPITAL_COMMUNITY)
Admission: EM | Admit: 2022-01-25 | Discharge: 2022-01-27 | DRG: 309 | Disposition: A | Discharge status: Home Health | Payer: Medicare PPO | Attending: Family Medicine | Admitting: Family Medicine

## 2022-01-25 DIAGNOSIS — E139 Other specified diabetes mellitus without complications: Secondary | ICD-10-CM | POA: Diagnosis present

## 2022-01-25 DIAGNOSIS — E7800 Pure hypercholesterolemia, unspecified: Secondary | ICD-10-CM | POA: Diagnosis not present

## 2022-01-25 DIAGNOSIS — E876 Hypokalemia: Principal | ICD-10-CM | POA: Diagnosis present

## 2022-01-25 DIAGNOSIS — N1832 Chronic kidney disease, stage 3b: Secondary | ICD-10-CM | POA: Diagnosis present

## 2022-01-25 DIAGNOSIS — G3184 Mild cognitive impairment, so stated: Secondary | ICD-10-CM | POA: Diagnosis present

## 2022-01-25 DIAGNOSIS — N179 Acute kidney failure, unspecified: Secondary | ICD-10-CM

## 2022-01-25 DIAGNOSIS — I959 Hypotension, unspecified: Secondary | ICD-10-CM | POA: Diagnosis not present

## 2022-01-25 DIAGNOSIS — I129 Hypertensive chronic kidney disease with stage 1 through stage 4 chronic kidney disease, or unspecified chronic kidney disease: Secondary | ICD-10-CM | POA: Diagnosis present

## 2022-01-25 DIAGNOSIS — E039 Hypothyroidism, unspecified: Secondary | ICD-10-CM | POA: Diagnosis present

## 2022-01-25 DIAGNOSIS — Z83438 Family history of other disorder of lipoprotein metabolism and other lipidemia: Secondary | ICD-10-CM

## 2022-01-25 DIAGNOSIS — I4891 Unspecified atrial fibrillation: Secondary | ICD-10-CM | POA: Diagnosis not present

## 2022-01-25 DIAGNOSIS — E1322 Other specified diabetes mellitus with diabetic chronic kidney disease: Secondary | ICD-10-CM | POA: Diagnosis not present

## 2022-01-25 DIAGNOSIS — Z833 Family history of diabetes mellitus: Secondary | ICD-10-CM | POA: Diagnosis not present

## 2022-01-25 DIAGNOSIS — N1831 Chronic kidney disease, stage 3a: Secondary | ICD-10-CM | POA: Diagnosis present

## 2022-01-25 DIAGNOSIS — I4892 Unspecified atrial flutter: Secondary | ICD-10-CM | POA: Diagnosis present

## 2022-01-25 DIAGNOSIS — I10 Essential (primary) hypertension: Secondary | ICD-10-CM | POA: Diagnosis present

## 2022-01-25 DIAGNOSIS — I480 Paroxysmal atrial fibrillation: Principal | ICD-10-CM | POA: Diagnosis present

## 2022-01-25 DIAGNOSIS — R739 Hyperglycemia, unspecified: Secondary | ICD-10-CM | POA: Diagnosis not present

## 2022-01-25 DIAGNOSIS — Z87891 Personal history of nicotine dependence: Secondary | ICD-10-CM | POA: Diagnosis not present

## 2022-01-25 DIAGNOSIS — R55 Syncope and collapse: Secondary | ICD-10-CM | POA: Diagnosis not present

## 2022-01-25 DIAGNOSIS — E1365 Other specified diabetes mellitus with hyperglycemia: Secondary | ICD-10-CM | POA: Diagnosis present

## 2022-01-25 DIAGNOSIS — E860 Dehydration: Secondary | ICD-10-CM | POA: Diagnosis present

## 2022-01-25 DIAGNOSIS — Z7901 Long term (current) use of anticoagulants: Secondary | ICD-10-CM

## 2022-01-25 DIAGNOSIS — Z79899 Other long term (current) drug therapy: Secondary | ICD-10-CM

## 2022-01-25 DIAGNOSIS — Z86010 Personal history of colonic polyps: Secondary | ICD-10-CM

## 2022-01-25 DIAGNOSIS — I340 Nonrheumatic mitral (valve) insufficiency: Secondary | ICD-10-CM | POA: Diagnosis not present

## 2022-01-25 DIAGNOSIS — Z79890 Hormone replacement therapy (postmenopausal): Secondary | ICD-10-CM

## 2022-01-25 DIAGNOSIS — Z8249 Family history of ischemic heart disease and other diseases of the circulatory system: Secondary | ICD-10-CM | POA: Diagnosis not present

## 2022-01-25 DIAGNOSIS — R7989 Other specified abnormal findings of blood chemistry: Secondary | ICD-10-CM | POA: Diagnosis present

## 2022-01-25 DIAGNOSIS — I491 Atrial premature depolarization: Secondary | ICD-10-CM | POA: Diagnosis not present

## 2022-01-25 DIAGNOSIS — E162 Hypoglycemia, unspecified: Secondary | ICD-10-CM | POA: Diagnosis not present

## 2022-01-25 DIAGNOSIS — R7401 Elevation of levels of liver transaminase levels: Secondary | ICD-10-CM | POA: Diagnosis present

## 2022-01-25 DIAGNOSIS — R946 Abnormal results of thyroid function studies: Secondary | ICD-10-CM | POA: Diagnosis present

## 2022-01-25 DIAGNOSIS — Z87440 Personal history of urinary (tract) infections: Secondary | ICD-10-CM

## 2022-01-25 DIAGNOSIS — E861 Hypovolemia: Secondary | ICD-10-CM | POA: Diagnosis present

## 2022-01-25 DIAGNOSIS — Z794 Long term (current) use of insulin: Secondary | ICD-10-CM

## 2022-01-25 DIAGNOSIS — E161 Other hypoglycemia: Secondary | ICD-10-CM | POA: Diagnosis not present

## 2022-01-25 LAB — URINALYSIS, ROUTINE W REFLEX MICROSCOPIC
Bilirubin Urine: NEGATIVE
Glucose, UA: 150 mg/dL — AB
Ketones, ur: NEGATIVE mg/dL
Nitrite: NEGATIVE
Protein, ur: 30 mg/dL — AB
Specific Gravity, Urine: 1.011 (ref 1.005–1.030)
WBC, UA: >50 WBC/hpf — ABNORMAL HIGH (ref 0–5)
pH: 6 (ref 5.0–8.0)

## 2022-01-25 LAB — COMPREHENSIVE METABOLIC PANEL
ALT: 56 U/L — ABNORMAL HIGH (ref 0–44)
AST: 48 U/L — ABNORMAL HIGH (ref 15–41)
Albumin: 4.5 g/dL (ref 3.5–5.0)
Alkaline Phosphatase: 75 U/L (ref 38–126)
Anion gap: 19 — ABNORMAL HIGH (ref 5–15)
BUN: 28 mg/dL — ABNORMAL HIGH (ref 8–23)
CO2: 17 mmol/L — ABNORMAL LOW (ref 22–32)
Calcium: 9.4 mg/dL (ref 8.9–10.3)
Chloride: 99 mmol/L (ref 98–111)
Creatinine, Ser: 2.17 mg/dL — ABNORMAL HIGH (ref 0.44–1.00)
GFR, Estimated: 22 mL/min — ABNORMAL LOW (ref >60)
Glucose, Bld: 311 mg/dL — ABNORMAL HIGH (ref 70–99)
Potassium: 2.4 mmol/L — CL (ref 3.5–5.1)
Sodium: 135 mmol/L (ref 135–145)
Total Bilirubin: 1 mg/dL (ref 0.3–1.2)
Total Protein: 8.1 g/dL (ref 6.5–8.1)

## 2022-01-25 LAB — I-STAT VENOUS BLOOD GAS, ED
Acid-base deficit: 8 mmol/L — ABNORMAL HIGH (ref 0.0–2.0)
Acid-base deficit: 4 mmol/L — ABNORMAL HIGH (ref 0.0–2.0)
Bicarbonate: 16.6 mmol/L — ABNORMAL LOW (ref 20.0–28.0)
Bicarbonate: 20 mmol/L (ref 20.0–28.0)
Calcium, Ion: 0.96 mmol/L — ABNORMAL LOW (ref 1.15–1.40)
Calcium, Ion: 0.95 mmol/L — ABNORMAL LOW (ref 1.15–1.40)
HCT: 42 % (ref 36.0–46.0)
HCT: 38 % (ref 36.0–46.0)
Hemoglobin: 14.3 g/dL (ref 12.0–15.0)
Hemoglobin: 12.9 g/dL (ref 12.0–15.0)
O2 Saturation: 81 %
O2 Saturation: 46 %
Potassium: 2.5 mmol/L — CL (ref 3.5–5.1)
Potassium: 3.2 mmol/L — ABNORMAL LOW (ref 3.5–5.1)
Sodium: 133 mmol/L — ABNORMAL LOW (ref 135–145)
Sodium: 134 mmol/L — ABNORMAL LOW (ref 135–145)
TCO2: 18 mmol/L — ABNORMAL LOW (ref 22–32)
TCO2: 21 mmol/L — ABNORMAL LOW (ref 22–32)
pCO2, Ven: 31.1 mmHg — ABNORMAL LOW (ref 44–60)
pCO2, Ven: 31.9 mmHg — ABNORMAL LOW (ref 44–60)
pH, Ven: 7.334 (ref 7.25–7.43)
pH, Ven: 7.404 (ref 7.25–7.43)
pO2, Ven: 48 mmHg — ABNORMAL HIGH (ref 32–45)
pO2, Ven: 25 mmHg — CL (ref 32–45)

## 2022-01-25 LAB — BASIC METABOLIC PANEL
Anion gap: 9 (ref 5–15)
BUN: 26 mg/dL — ABNORMAL HIGH (ref 8–23)
CO2: 20 mmol/L — ABNORMAL LOW (ref 22–32)
Calcium: 8.5 mg/dL — ABNORMAL LOW (ref 8.9–10.3)
Chloride: 104 mmol/L (ref 98–111)
Creatinine, Ser: 1.77 mg/dL — ABNORMAL HIGH (ref 0.44–1.00)
GFR, Estimated: 28 mL/min — ABNORMAL LOW (ref >60)
Glucose, Bld: 173 mg/dL — ABNORMAL HIGH (ref 70–99)
Potassium: 3.4 mmol/L — ABNORMAL LOW (ref 3.5–5.1)
Sodium: 133 mmol/L — ABNORMAL LOW (ref 135–145)

## 2022-01-25 LAB — CBC WITH DIFFERENTIAL/PLATELET
Abs Immature Granulocytes: 0.03 10*3/uL (ref 0.00–0.07)
Abs Immature Granulocytes: 0.04 10*3/uL (ref 0.00–0.07)
Basophils Absolute: 0.1 10*3/uL (ref 0.0–0.1)
Basophils Absolute: 0 10*3/uL (ref 0.0–0.1)
Basophils Relative: 1 %
Basophils Relative: 0 %
Eosinophils Absolute: 0 10*3/uL (ref 0.0–0.5)
Eosinophils Absolute: 0 10*3/uL (ref 0.0–0.5)
Eosinophils Relative: 1 %
Eosinophils Relative: 0 %
HCT: 41.2 % (ref 36.0–46.0)
HCT: 40.2 % (ref 36.0–46.0)
Hemoglobin: 13.7 g/dL (ref 12.0–15.0)
Hemoglobin: 13.3 g/dL (ref 12.0–15.0)
Immature Granulocytes: 1 %
Immature Granulocytes: 0 %
Lymphocytes Relative: 32 %
Lymphocytes Relative: 16 %
Lymphs Abs: 2 10*3/uL (ref 0.7–4.0)
Lymphs Abs: 1.8 10*3/uL (ref 0.7–4.0)
MCH: 29.7 pg (ref 26.0–34.0)
MCH: 29.7 pg (ref 26.0–34.0)
MCHC: 33.3 g/dL (ref 30.0–36.0)
MCHC: 33.1 g/dL (ref 30.0–36.0)
MCV: 89.2 fL (ref 80.0–100.0)
MCV: 89.7 fL (ref 80.0–100.0)
Monocytes Absolute: 0.4 10*3/uL (ref 0.1–1.0)
Monocytes Absolute: 0.7 10*3/uL (ref 0.1–1.0)
Monocytes Relative: 7 %
Monocytes Relative: 6 %
Neutro Abs: 3.7 10*3/uL (ref 1.7–7.7)
Neutro Abs: 8.4 10*3/uL — ABNORMAL HIGH (ref 1.7–7.7)
Neutrophils Relative %: 58 %
Neutrophils Relative %: 78 %
Platelets: 238 10*3/uL (ref 150–400)
Platelets: 191 10*3/uL (ref 150–400)
RBC: 4.62 MIL/uL (ref 3.87–5.11)
RBC: 4.48 MIL/uL (ref 3.87–5.11)
RDW: 13.5 % (ref 11.5–15.5)
RDW: 13.6 % (ref 11.5–15.5)
WBC: 6.3 10*3/uL (ref 4.0–10.5)
WBC: 10.9 10*3/uL — ABNORMAL HIGH (ref 4.0–10.5)
nRBC: 0 % (ref 0.0–0.2)
nRBC: 0 % (ref 0.0–0.2)

## 2022-01-25 LAB — TSH: TSH: 35.069 u[IU]/mL — ABNORMAL HIGH (ref 0.350–4.500)

## 2022-01-25 LAB — MAGNESIUM: Magnesium: 2.5 mg/dL — ABNORMAL HIGH (ref 1.7–2.4)

## 2022-01-25 LAB — CBG MONITORING, ED
Glucose-Capillary: 414 mg/dL — ABNORMAL HIGH (ref 70–99)
Glucose-Capillary: 151 mg/dL — ABNORMAL HIGH (ref 70–99)
Glucose-Capillary: 111 mg/dL — ABNORMAL HIGH (ref 70–99)

## 2022-01-25 LAB — BRAIN NATRIURETIC PEPTIDE: B Natriuretic Peptide: 555.9 pg/mL — ABNORMAL HIGH (ref 0.0–100.0)

## 2022-01-25 LAB — BETA-HYDROXYBUTYRIC ACID: Beta-Hydroxybutyric Acid: 0.16 mmol/L (ref 0.05–0.27)

## 2022-01-25 LAB — HEMOGLOBIN A1C
Hgb A1c MFr Bld: 12.4 % — ABNORMAL HIGH (ref 4.8–5.6)
Mean Plasma Glucose: 309.18 mg/dL

## 2022-01-25 MED ORDER — LEVOTHYROXINE SODIUM 75 MCG PO TABS
75.0000 ug | ORAL_TABLET | Freq: Every day | ORAL | Status: DC
  Administered 2022-01-26 – 2022-01-27 (×2): 75 ug via ORAL
  Filled 2022-01-25 (×3): qty 1

## 2022-01-25 MED ORDER — POTASSIUM CHLORIDE CRYS ER 20 MEQ PO TBCR
40.0000 meq | EXTENDED_RELEASE_TABLET | Freq: Once | ORAL | Status: AC
  Administered 2022-01-25: 40 meq via ORAL
  Filled 2022-01-25: qty 2

## 2022-01-25 MED ORDER — AMIODARONE HCL IN DEXTROSE 360-4.14 MG/200ML-% IV SOLN
60.0000 mg/h | INTRAVENOUS | Status: AC
  Administered 2022-01-25: 60 mg/h via INTRAVENOUS
  Filled 2022-01-25: qty 200

## 2022-01-25 MED ORDER — SODIUM CHLORIDE 0.9 % IV BOLUS
1000.0000 mL | Freq: Once | INTRAVENOUS | Status: AC
  Administered 2022-01-25: 1000 mL via INTRAVENOUS

## 2022-01-25 MED ORDER — INSULIN GLARGINE-YFGN 100 UNIT/ML ~~LOC~~ SOLN
10.0000 [IU] | Freq: Every day | SUBCUTANEOUS | Status: DC
  Administered 2022-01-26 – 2022-01-27 (×2): 10 [IU] via SUBCUTANEOUS
  Filled 2022-01-25 (×3): qty 0.1

## 2022-01-25 MED ORDER — AMIODARONE HCL IN DEXTROSE 360-4.14 MG/200ML-% IV SOLN
30.0000 mg/h | INTRAVENOUS | Status: DC
  Administered 2022-01-26: 30 mg/h via INTRAVENOUS
  Filled 2022-01-25 (×2): qty 200

## 2022-01-25 MED ORDER — POTASSIUM CHLORIDE 10 MEQ/100ML IV SOLN
10.0000 meq | INTRAVENOUS | Status: DC
  Administered 2022-01-25: 10 meq via INTRAVENOUS
  Filled 2022-01-25: qty 100

## 2022-01-25 MED ORDER — ACETAMINOPHEN 325 MG PO TABS
650.0000 mg | ORAL_TABLET | Freq: Four times a day (QID) | ORAL | Status: DC | PRN
  Administered 2022-01-26 (×2): 650 mg via ORAL
  Filled 2022-01-25 (×2): qty 2

## 2022-01-25 MED ORDER — POTASSIUM CHLORIDE 10 MEQ/100ML IV SOLN
10.0000 meq | INTRAVENOUS | Status: DC
  Administered 2022-01-25 – 2022-01-26 (×2): 10 meq via INTRAVENOUS
  Filled 2022-01-25 (×2): qty 100

## 2022-01-25 MED ORDER — ACETAMINOPHEN 650 MG RE SUPP: 650.0000 mg | Freq: Four times a day (QID) | RECTAL | Status: DC | PRN

## 2022-01-25 MED ORDER — SODIUM CHLORIDE 0.9 % IV SOLN
1.0000 g | Freq: Once | INTRAVENOUS | Status: AC
  Administered 2022-01-25: 1 g via INTRAVENOUS
  Filled 2022-01-25: qty 10

## 2022-01-25 MED ORDER — INSULIN ASPART 100 UNIT/ML IJ SOLN: 10.0000 [IU] | Freq: Once | INTRAMUSCULAR | Status: DC

## 2022-01-25 MED ORDER — INSULIN ASPART 100 UNIT/ML IJ SOLN
0.0000 [IU] | Freq: Three times a day (TID) | INTRAMUSCULAR | Status: DC
  Administered 2022-01-26: 3 [IU] via SUBCUTANEOUS
  Administered 2022-01-26: 5 [IU] via SUBCUTANEOUS
  Administered 2022-01-27: 2 [IU] via SUBCUTANEOUS
  Administered 2022-01-27: 1 [IU] via SUBCUTANEOUS

## 2022-01-25 MED ORDER — INSULIN GLARGINE (2 UNIT DIAL) 300 UNIT/ML ~~LOC~~ SOPN: 12.0000 [IU] | PEN_INJECTOR | Freq: Every day | SUBCUTANEOUS | Status: DC

## 2022-01-25 MED ORDER — ESMOLOL HCL-SODIUM CHLORIDE 2000 MG/100ML IV SOLN
25.0000 ug/kg/min | INTRAVENOUS | Status: DC
  Filled 2022-01-25: qty 100

## 2022-01-25 MED ORDER — SODIUM CHLORIDE 0.9 % IV SOLN: INTRAVENOUS | Status: DC

## 2022-01-25 MED ORDER — ATORVASTATIN CALCIUM 10 MG PO TABS
20.0000 mg | ORAL_TABLET | Freq: Every day | ORAL | Status: DC
  Administered 2022-01-26 – 2022-01-27 (×2): 20 mg via ORAL
  Filled 2022-01-25 (×2): qty 2

## 2022-01-25 MED ORDER — LACTATED RINGERS IV SOLN: INTRAVENOUS | Status: DC

## 2022-01-25 MED ORDER — APIXABAN 2.5 MG PO TABS
2.5000 mg | ORAL_TABLET | Freq: Two times a day (BID) | ORAL | Status: DC
Start: 2022-01-25 — End: 2022-01-27
  Administered 2022-01-25 – 2022-01-27 (×5): 2.5 mg via ORAL
  Filled 2022-01-25 (×5): qty 1

## 2022-01-25 NOTE — Assessment & Plan Note (Addendum)
87 year old with history of paroxysmal atrial fibrillation on eliquis presenting with hypotension and atrial fibrillation with RVR with rate of 150 ?-admit to progressive ?-given 2L IVF ?-hypotensive, not candidate for cardizem or beta blocker. Cardiology consulted and started on amiodarone drip ?-continue eliquis. Last took Sunday night. Missed Saturday ?-resume toprol/tiadylt when blood pressure allows/per cardiology recs.  ?-follows with dr. Debara Pickett  ?

## 2022-01-25 NOTE — Assessment & Plan Note (Signed)
Hypotensive, holding home anti hypertensive meds: maxzide, benicar, toprol and tiadylt  ?Follow and add back as pressures allow  ?

## 2022-01-25 NOTE — Assessment & Plan Note (Addendum)
DKA ruled out ?

## 2022-01-25 NOTE — Progress Notes (Signed)
ANTICOAGULATION CONSULT NOTE - Initial Consult ? ?Pharmacy Consult for Apixaban ?Indication: atrial fibrillation ? ?No Known Allergies ? ?Patient Measurements: ?  ?Vital Signs: ?Temp: 97.5 ?F (36.4 ?C) (04/17 1218) ?Temp Source: Rectal (04/17 1218) ?BP: 97/61 (04/17 1430) ?Pulse Rate: 110 (04/17 1430) ? ?Labs: ?Recent Labs  ?  01/25/22 ?1327 01/25/22 ?1337  ?HGB 13.7 14.3  ?HCT 41.2 42.0  ?PLT 238  --   ?CREATININE 2.17*  --   ? ? ?Estimated Creatinine Clearance: 17.1 mL/min (A) (by C-G formula based on SCr of 2.17 mg/dL (H)). ? ? ?Medical History: ?Past Medical History:  ?Diagnosis Date  ? Colon polyp   ? Diabetes mellitus   ? Hyperlipidemia   ? Hypertension   ? Poor appetite   ? Thyroid disease   ? ? ?Medications:  ?(Not in a hospital admission) ? ?Scheduled:  ? apixaban  2.5 mg Oral BID  ? ?Infusions:  ? amiodarone    ? amiodarone    ? lactated ringers 125 mL/hr at 01/25/22 1520  ? potassium chloride 10 mEq (01/25/22 1513)  ? ?PRN:  ? ?Assessment: ?87 yof with a history of AF on eliquis, DM, HTN, HLD, hypothyroidism. Patient is presenting with AF.  ? ?Patient is on apixaban prior to arrival. Last dose 4/15. Cardiology planning to resume apixaban at this time. ? ?Hgb 14.3; plt 238 ? ?Goal of Therapy:  ?Heparin level 0.3-0.7 units/ml ?aPTT 66-102 seconds ?Monitor platelets by anticoagulation protocol: Yes ?  ?Plan:  ?Apixaban 2.'5mg'$  BID ?--This appears to be home dosing ?--87 yo & SCr 2.17 (appears to be above BL) ?Continue to monitor H&H and platelets ? ?Lorelei Pont, PharmD, BCPS ?01/25/2022 3:29 PM ?ED Clinical Pharmacist -  [phone] ? ?  ?

## 2022-01-25 NOTE — H&P (Signed)
?History and Physical  ? ? ?PatientTyasia Nichols DEY:[id number] DOB: 04/17/1935 ?DOA: 01/25/2022 ?DOS: the patient was seen and examined on 01/25/2022 ?PCP: Deland Pretty, MD  ?Patient coming from: Home - lives with ; NOK:  ? ? ?Chief Complaint: elevated BS, hypotension with syncope ? ?HPI: Nikyla Nichols is a 87 y.o. female with medical history significant of PAF on eliquis, T2DM, HLD, HTN,  hypothyroidism who presents to ED after "not feeling well with high sugar." She called her daughter around 1:30AM and told her she didn't feel well. EMS was called but did not bring her into hospital at that time. Later on in day her daughter went over to her house as she still wasn't feeling well and as she was talking she started to have slurred speech and look like she was going to vomit. EMS was called. When they arrived they could not get a blood pressure. When she stood up she had syncopal episode, but came right back. Found to be in atrial fibrillation with RVR. Also had another episode of passing out when she got up with EMS. States she has had some shortness of breath, no cough, + orthopnea at times. No swelling in legs.  ? ? ?Daughter states her mom has not been feeling well for about a month. She has been fatigued/weak and not eating well. Does drink well. Sugars have been well controlled until this week.  ? ? ? Denies any fever/chills, vision changes/headaches, chest pain or palpitations, no abdominal pain, N/V/D, dysuria or leg swelling. Was treated for UTI a month or so ago.  ? ?Seen in ED on 3/24 and 4/8 for chest pain on right side that radiated to her back thought to be a pull muscle. Also seen by sports medicine. Was given pain medication. Fortunately, she states this has resolved.  ? ? ? ?ER Course:  vitals: temp: 97.5, bp: 70/52, HR: 149, RR: 24, oxygen: 98%RA ?Pertinent labs: potassium: 2.4, co2: 17, glucose 311, BUN: 28, creatinine: 2.17, AST: 48, ALT: 56, AG: 19, BNP: 555, pH: 7.334 ?CXR: no active disease   ?In ED: 22mq potassium ordered+ 478m orally, 2L IVF bolus,  ?Cardiology consulted.  ? ? ? ?Review of Systems: As mentioned in the history of present illness. All other systems reviewed and are negative. ?Past Medical History:  ?Diagnosis Date  ? Colon polyp   ? Diabetes mellitus   ? Hyperlipidemia   ? Hypertension   ? Poor appetite   ? Thyroid disease   ? ?Past Surgical History:  ?Procedure Laterality Date  ? BREAST EXCISIONAL BIOPSY Right   ? CARDIOVASCULAR STRESS TEST  07/15/2006  ? Normal scan, no ECG changes  ? TRANSTHORACIC ECHOCARDIOGRAM  01/08/2011  ? EF 60-65%, moderae mitral regurg, LA mild-moderately dilated,  ? ?Social History:  reports that she quit smoking about 18 years ago. Her smoking use included cigarettes. She has never used smokeless tobacco. She reports that she does not drink alcohol and does not use drugs. ? ?No Known Allergies ? ?Family History  ?Problem Relation Age of Onset  ? Diabetes Mother   ? Hypertension Father   ? Hyperlipidemia Father   ? Diabetes Sister   ? Hypertension Sister   ? Hyperlipidemia Sister   ? Hypertension Brother   ? Stroke Sister   ? Hypertension Sister   ? Diabetes Sister   ? Cancer Daughter   ? Heart failure Child   ? Breast cancer Neg Hx   ? ? ?Prior to Admission medications   ?  Medication Sig Start Date End Date Taking? Authorizing Provider  ?acetaminophen-codeine (TYLENOL #3) 300-30 MG tablet Take 1 tablet by mouth every 6 (six) hours as needed for moderate pain. 01/01/22   Jeanell Sparrow, DO  ?apixaban (ELIQUIS) 2.5 MG TABS tablet TAKE 1 TABLET BY MOUTH TWICE A DAY 01/04/22   Hilty, Nadean Corwin, MD  ?atorvastatin (LIPITOR) 20 MG tablet Take 20 mg by mouth daily. 02/01/20   [provider]  ?furosemide (LASIX) 20 MG tablet Take 20 mg by mouth daily as needed for edema. 03/30/21   [provider]  ?gabapentin (NEURONTIN) 100 MG capsule Take 1 capsule (100 mg total) by mouth 3 (three) times daily. 12/10/21   Gregor Hams, MD  ?insulin aspart (NOVOLOG  FLEXPEN) 100 UNIT/ML FlexPen Inject 8 Units into the skin 3 (three) times daily with meals. 09/04/21   Mariel Aloe, MD  ?insulin glargine, 2 Unit Dial, (TOUJEO MAX SOLOSTAR) 300 UNIT/ML Solostar Pen Inject 12 Units into the skin daily. 09/04/21   Mariel Aloe, MD  ?levothyroxine (SYNTHROID) 75 MCG tablet Take 75 mcg by mouth daily before breakfast. 03/09/21   [provider]  ?lidocaine (LIDODERM) 5 % Place 1 patch onto the skin daily as needed. Remove & Discard patch within 12 hours or as directed by MD 01/01/22   Wynona Dove A, DO  ?lidocaine (LIDODERM) 5 % Place 1 patch onto the skin daily. Remove & Discard patch within 12 hours or as directed by MD 01/16/22   Tonye Pearson, PA-C  ?methocarbamol (ROBAXIN) 500 MG tablet Take 1 tablet (500 mg total) by mouth 2 (two) times daily. 01/01/22   Jeanell Sparrow, DO  ?methocarbamol (ROBAXIN) 500 MG tablet Take 1 tablet (500 mg total) by mouth 2 (two) times daily for 20 days. 01/16/22 02/05/22  Tonye Pearson, PA-C  ?metoprolol succinate (TOPROL XL) 25 MG 24 hr tablet Take 1 tablet (25 mg total) by mouth daily. 10/06/21   Hilty, Nadean Corwin, MD  ?olmesartan (BENICAR) 20 MG tablet Take 0.5 tablets (10 mg total) by mouth daily. 09/04/21   Mariel Aloe, MD  ?TIADYLT ER 180 MG 24 hr capsule TAKE 1 CAPSULE BY MOUTH EVERY DAY 11/27/21   Hilty, Nadean Corwin, MD  ? ? ?Physical Exam: ?Vitals:  ? 01/25/22 1330 01/25/22 1345 01/25/22 1400 01/25/22 1430  ?BP: 105/65 1'06/73 98/79 97/61 '$  ?Pulse:   (!) 125 (!) 110  ?Resp: '16 12 17 '$ (!) 9  ?Temp:      ?TempSrc:      ?SpO2:   98%   ? ?General:  Appears calm and comfortable and is in NAD ?Eyes:  PERRL, EOMI, normal lids, iris ?ENT:  grossly normal hearing, lips & tongue, slightly dry mucous membranes, appropriate dentition ?Neck:  no LAD, masses or thyromegaly; no carotid bruits ?Cardiovascular:  irregularly, irregular no m/r/g. No LE edema.  ?Respiratory:   CTA bilaterally with no wheezes/rales/rhonchi.  Normal respiratory  effort. ?Abdomen:  soft, NT, ND, NABS ?Back:   normal alignment, no CVAT ?Skin:  no rash or induration seen on limited exam ?Musculoskeletal:  grossly normal tone BUE/BLE, good ROM, no bony abnormality ?Lower extremity:  No LE edema.  Limited foot exam with no ulcerations.  2+ distal pulses. ?Psychiatric:  grossly normal mood and affect, speech fluent and appropriate, AOx3 ?Neurologic:  CN 2-12 grossly intact, moves all extremities in coordinated fashion, sensation intact ? ? ?Radiological Exams on Admission: ?Independently reviewed - see discussion in A/P where applicable ? ?  DG Chest Port 1 View ? ?Result Date: 01/25/2022 ?CLINICAL DATA:  Hyperglycemia. EXAM: PORTABLE CHEST 1 VIEW COMPARISON:  January 01, 2022. FINDINGS: The heart size and mediastinal contours are within normal limits. Both lungs are clear. The visualized skeletal structures are unremarkable. IMPRESSION: No active disease. Electronically Signed   By: Marijo Conception M.D.   On: 01/25/2022 13:10   ? ?EKG: Independently reviewed.  Atrial fibrillation with rVR with rate 155; nonspecific ST changes with no evidence of acute ischemia ? ? ?Labs on Admission: I have personally reviewed the available labs and imaging studies at the time of the admission. ? ?Pertinent labs:   ?potassium: 2.4,  ?co2: 17,  ?glucose 311>150  ?BUN: 28,  ?creatinine: 2.17,  ?AST: 48,  ?ALT: 56,  ?AG: 19,  ?BNP: 555,  ?pH: 7.334 ? ?Assessment and Plan: ?Principal Problem: ?  Atrial fibrillation with rapid ventricular response ([place]) ?Active Problems: ?  LADA (latent autoimmune diabetes in adults), managed as type 1 ([place]) ?  AKI (acute kidney injury) ([place]) ?  Hypokalemia ?  Elevated transaminase level ?  Essential hypertension ?  Hypothyroidism ?  Hypercholesterolemia ?  ? ?Assessment and Plan: ?* Atrial fibrillation with rapid ventricular response ([facility]) ?87 year old with history of paroxysmal atrial fibrillation on eliquis presenting with hypotension and atrial fibrillation with RVR  with rate of 150 ?-admit to progressive ?-given 2L IVF ?-hypotensive, not candidate for cardizem or beta blocker. Cardiology consulted and started on amiodarone drip ?-continue eliquis. Last took Sunday night. Mis

## 2022-01-25 NOTE — ED Triage Notes (Signed)
Pt coming from home. [facility] called for hyperglycemia. CBG 450 with EMS. Weak radial pulses. Pt attempted to go to the bathroom with EMS assistance and pt went unresponsive when she stood up to go to the bathroom. Responsive after laying on the floor and went unresponsive again when trying to sit up. Pt denies pain, SHOB. Pt had previous fall 3 weeks ago.  ? ?22 left AC, 250 bolus of NS PTA.  ? ? ?

## 2022-01-25 NOTE — Consult Note (Addendum)
?Cardiology Consultation:  ? ?Patient ID: [name] ?MRN: [id number]; DOB: [birth year] ? ?Admit date: 01/25/2022 ?Date of Consult: 01/25/2022 ? ?PCP:  Deland Pretty, MD ?  ?[facility] Providers ?Cardiologist:  Pixie Casino, MD   { ? ?Patient Profile:  ? ?[name] is a 87 y.o. female with a hx of paroxysmal atrial fibrillation on Eliquis for anticoagulation, diabetes mellitus, hypertension, hyperlipidemia and hypothyroidism who is being seen 01/25/2022 for the evaluation of atrial fibrillation with rapid ventricular rate at the request of [name]. ? ?Initially diagnosed with atrial fibrillation in 2014. ?Last echocardiogram November 2022 showed LV function of 65 to 70%, no regional wall motion abnormality, mildly reduced RV function.  RVSP 31.7 mmHg.  Mildly dilated LA. ? ?Per [name] note 10/06/2021 "[name] had recent breakthrough A. fib with RVR in October.  Since then she had been on high-dose Toprol-XL 50 mg twice daily.  She was seen in the ER just a few days ago with bradycardia and dizziness thought to be also related to dehydration.  That is improved after discontinuing her beta-blocker.  Unfortunately heart rates up in the 90s and there is risk of recurrent atrial fibrillation.  I did advise restarting Toprol-XL 50 mg daily which is 50% of her previous dose.  Continue anticoagulation with Eliquis 2.5 mg twice daily".  ? ?Seen in the emergency room 01/01/2022 for chest wall pain after moving a mattress. ?Again seen in ER 01/16/2022 for back pain. ? ?History of Present Illness:  ? ?[name] reports being "sick" for past few weeks.  Being seen by "sugar doctor" and multiple medication adjustment.  She reports feeling weak and tired.  She lives by herself.  Frequently checks on her heart.  Last night she was not feeling well and EMS was called.  Noted hyperglycemic with blood sugar in 300s.  Denied transferred for further evaluation.  She took her insulin and went to bed.  This morning she  continued to feel weak.  Daughter came to check on her.  EMS was called.  It was hard to get her blood pressure.  She wanted to use bathroom for bowel movement.  However, patient had "syncope".  EMS was there during this episode.  She did not hit her head.  She was caught by EMS.  She was noted in atrial fibrillation with rapid ventricular rate on the monitor.  Brought to ER for further evaluation. ? ?In ER, initial blood pressure 70/52.  She was given 1 L of normal saline bolus and then placed on drip with improved blood pressure. ? ?Potassium 2.4>> 2.5 ?Creatinine 2.17 ?BNP 555 ?Sodium 133 ? ?Patient reports that she is not feeling well thus she did not took her Eliquis yesterday.  She does not feel palpitation.  Poor historian.  Denies lower extremity edema.  Daughter concerned that she is taking lot of medication.  She is laying flat on bed.  Reports feeling dizziness upon standing. ? ?Past Medical History:  ?Diagnosis Date  ? Colon polyp   ? Diabetes mellitus   ? Hyperlipidemia   ? Hypertension   ? Poor appetite   ? Thyroid disease   ? ? ?Past Surgical History:  ?Procedure Laterality Date  ? BREAST EXCISIONAL BIOPSY Right   ? CARDIOVASCULAR STRESS TEST  07/15/2006  ? Normal scan, no ECG changes  ? TRANSTHORACIC ECHOCARDIOGRAM  01/08/2011  ? EF 60-65%, moderae mitral regurg, LA mild-moderately dilated,  ?  ? ?Inpatient Medications: ?Scheduled Meds: ? ?Continuous Infusions: ? lactated  ringers 125 mL/hr at 01/25/22 1520  ? potassium chloride 10 mEq (01/25/22 1513)  ? ?PRN Meds: ? ? ?Allergies:   No Known Allergies ? ?Social History:   ?Social History  ? ?Socioeconomic History  ? Marital status: Widowed  ?  Spouse name: Not on file  ? Number of children: Not on file  ? Years of education: Not on file  ? Highest education level: Not on file  ?Occupational History  ? Not on file  ?Tobacco Use  ? Smoking status: Former  ?  Types: Cigarettes  ?  Quit date: 05/18/2003  ?  Years since quitting: 18.7  ? Smokeless tobacco:  Never  ?Vaping Use  ? Vaping Use: Never used  ?Substance and Sexual Activity  ? Alcohol use: No  ? Drug use: No  ? Sexual activity: Not on file  ?Other Topics Concern  ? Not on file  ?Social History Narrative  ? Not on file  ? ?Social Determinants of Health  ? ?Financial Resource Strain: Not on file  ?Food Insecurity: Not on file  ?Transportation Needs: Not on file  ?Physical Activity: Not on file  ?Stress: Not on file  ?Social Connections: Not on file  ?Intimate Partner Violence: Not on file  ?  ?Family History:   ?Family History  ?Problem Relation Age of Onset  ? Diabetes Mother   ? Hypertension Father   ? Hyperlipidemia Father   ? Diabetes Sister   ? Hypertension Sister   ? Hyperlipidemia Sister   ? Hypertension Brother   ? Stroke Sister   ? Hypertension Sister   ? Diabetes Sister   ? Cancer Daughter   ? Heart failure Child   ? Breast cancer Neg Hx   ?  ? ?ROS:  ?Please see the history of present illness.  ?All other ROS reviewed and negative.    ? ?Physical Exam/Data:  ? ?Vitals:  ? 01/25/22 1330 01/25/22 1345 01/25/22 1400 01/25/22 1430  ?BP: 105/65 1'06/73 98/79 97/61 '$  ?Pulse:   (!) 125 (!) 110  ?Resp: '16 12 17 '$ (!) 9  ?Temp:      ?TempSrc:      ?SpO2:   98%   ? ? ?Intake/Output Summary (Last 24 hours) at 01/25/2022 1525 ?Last data filed at 01/25/2022 1503 ?Gross per 24 hour  ?Intake 2110.58 ml  ?Output --  ?Net 2110.58 ml  ? ? ?  01/16/2022  ?  2:25 PM 12/31/2021  ?  1:41 PM 12/10/2021  ? 10:14 AM  ?Last 3 Weights  ?Weight (lbs) 143 lb 144 lb 6.4 oz 147 lb 3.2 oz  ?Weight (kg) 64.864 kg 65.499 kg 66.769 kg  ?   ?There is no height or weight on file to calculate BMI.  ?General: Ill-appearing elderly female in no acute distress ?HEENT: normal ?Neck: no JVD ?Vascular: No carotid bruits; Distal pulses 2+ bilaterally ?Cardiac:  normal S1, S2; irregularly irregular tachycardia; no murmur  ?Lungs:  clear to auscultation bilaterally, no wheezing, rhonchi or rales  ?Abd: soft, nontender, no hepatomegaly  ?Ext: no  edema ?Musculoskeletal:  No deformities, BUE and BLE strength normal and equal ?Skin: warm and dry  ?Neuro:  CNs 2-12 intact, no focal abnormalities noted ?Psych:  Normal affect  ? ?EKG:  The EKG was personally reviewed and demonstrates: Atrial fibrillation, heart rate 135, ST upsloping in inferior lateral lead with repolarization abnormality ?Telemetry:  Telemetry was personally reviewed and demonstrates: Atrial fibrillation with rapid ventricular rate ? ?Relevant CV Studies: ? ?Echo 08/2021 ?  1. Left ventricular ejection fraction, by estimation, is 65 to 70%. The  ?left ventricle has normal function. The left ventricle has no regional  ?wall motion abnormalities. There is mild left ventricular hypertrophy.  ?Left ventricular diastolic parameters  ?are indeterminate.  ? 2. Right ventricular systolic function is mildly reduced. The right  ?ventricular size is normal. There is normal pulmonary artery systolic  ?pressure. The estimated right ventricular systolic pressure is 10.9 mmHg.  ? 3. Left atrial size was mildly dilated. The atrial septum bows right  ?suggesting LA pressure overload.  ? 4. The mitral valve is normal in structure. Trivial mitral valve  ?regurgitation. No evidence of mitral stenosis.  ? 5. The aortic valve is tricuspid. Aortic valve regurgitation is not  ?visualized. Aortic valve sclerosis/calcification is present, without any  ?evidence of aortic stenosis.  ? 6. The inferior vena cava is normal in size with greater than 50%  ?respiratory variability, suggesting right atrial pressure of 3 mmHg.  ? 7. The patient was in atrial fibrillation.  ? ?Laboratory Data: ? ?High Sensitivity Troponin:   ?Recent Labs  ?Lab 01/01/22 ?1037 01/01/22 ?1201  ?TROPONINIHS 4 5  ?   ?Chemistry ?Recent Labs  ?Lab 01/25/22 ?1327 01/25/22 ?1337  ?NA 135 133*  ?K 2.4* 2.5*  ?CL 99  --   ?CO2 17*  --   ?GLUCOSE 311*  --   ?BUN 28*  --   ?CREATININE 2.17*  --   ?CALCIUM 9.4  --   ?GFRNONAA 22*  --   ?ANIONGAP 19*  --   ?   ? ?Hematology ?Recent Labs  ?Lab 01/25/22 ?1327 01/25/22 ?1337  ?WBC 6.3  --   ?RBC 4.62  --   ?HGB 13.7 14.3  ?HCT 41.2 42.0  ?MCV 89.2  --   ?MCH 29.7  --   ?MCHC 33.3  --   ?RDW 13.5  --   ?PLT 238  --   ? ? ?Radio

## 2022-01-25 NOTE — Assessment & Plan Note (Addendum)
Likely pre renal in setting of atrial fibrillation with RVR + poor PO intake + hypotension+nephrotoxic drugs  ?-strict I/O ?-UA and urine studies pending ?-avoid nephrotoxic drugs, holding her ARB/maxzide/prn lasix  ?-received 2L boluses in ED, continue IVF ?-trend renal function  ?-maintain MAP >65 +heart rate control  ?

## 2022-01-25 NOTE — Assessment & Plan Note (Signed)
TSH pending Continue home synthroid  

## 2022-01-25 NOTE — ED Provider Notes (Signed)
?[facility] ?Provider Note ? ? ?CSN: [id number] ?Arrival date & time: 01/25/22  1145 ? ?  ? ?History ? ?No chief complaint on file. ? ? ?Sherrice Creekmore is a 87 y.o. female. ? ?HPI ?Patient presents via EMS with concern of weakness, hyperglycemia. ?Seemingly the patient has been taking her medication as directed, but overnight she was weak, found to have hyperglycemia.  After initial EMS visit did not result in transport, she now presents after worsening weakness following the visit. ?EMS personnel report that upon trying to stand the patient upright to go to the bathroom, she had episode of syncope.  This recurred a second time while trying to position the patient.  Reportedly she may have had agonal respirations briefly, but did not require CPR.  Patient's glucose level was elevated, on the monitor, without a discrete value. ? ? ?The patient herself complains of weakness, denies focal pain, denies confusion. ?  ? ?Home Medications ?Prior to Admission medications   ?Medication Sig Start Date End Date Taking? Authorizing Provider  ?acetaminophen-codeine (TYLENOL #3) 300-30 MG tablet Take 1 tablet by mouth every 6 (six) hours as needed for moderate pain. 01/01/22   Jeanell Sparrow, DO  ?apixaban (ELIQUIS) 2.5 MG TABS tablet TAKE 1 TABLET BY MOUTH TWICE A DAY 01/04/22   Hilty, Nadean Corwin, MD  ?atorvastatin (LIPITOR) 20 MG tablet Take 20 mg by mouth daily. 02/01/20   [provider]  ?furosemide (LASIX) 20 MG tablet Take 20 mg by mouth daily as needed for edema. 03/30/21   [provider]  ?gabapentin (NEURONTIN) 100 MG capsule Take 1 capsule (100 mg total) by mouth 3 (three) times daily. 12/10/21   Gregor Hams, MD  ?insulin aspart (NOVOLOG FLEXPEN) 100 UNIT/ML FlexPen Inject 8 Units into the skin 3 (three) times daily with meals. 09/04/21   Mariel Aloe, MD  ?insulin glargine, 2 Unit Dial, (TOUJEO MAX SOLOSTAR) 300 UNIT/ML Solostar Pen Inject 12 Units into the skin  daily. 09/04/21   Mariel Aloe, MD  ?levothyroxine (SYNTHROID) 75 MCG tablet Take 75 mcg by mouth daily before breakfast. 03/09/21   [provider]  ?lidocaine (LIDODERM) 5 % Place 1 patch onto the skin daily as needed. Remove & Discard patch within 12 hours or as directed by MD 01/01/22   Wynona Dove A, DO  ?lidocaine (LIDODERM) 5 % Place 1 patch onto the skin daily. Remove & Discard patch within 12 hours or as directed by MD 01/16/22   Tonye Pearson, PA-C  ?methocarbamol (ROBAXIN) 500 MG tablet Take 1 tablet (500 mg total) by mouth 2 (two) times daily. 01/01/22   Jeanell Sparrow, DO  ?methocarbamol (ROBAXIN) 500 MG tablet Take 1 tablet (500 mg total) by mouth 2 (two) times daily for 20 days. 01/16/22 02/05/22  Tonye Pearson, PA-C  ?metoprolol succinate (TOPROL XL) 25 MG 24 hr tablet Take 1 tablet (25 mg total) by mouth daily. 10/06/21   Hilty, Nadean Corwin, MD  ?olmesartan (BENICAR) 20 MG tablet Take 0.5 tablets (10 mg total) by mouth daily. 09/04/21   Mariel Aloe, MD  ?TIADYLT ER 180 MG 24 hr capsule TAKE 1 CAPSULE BY MOUTH EVERY DAY 11/27/21   Hilty, Nadean Corwin, MD  ?   ? ?Allergies    ?Patient has no known allergies.   ? ?Review of Systems   ?Review of Systems  ?Unable to perform ROS: Acuity of condition  ? ?Physical Exam ?Updated Vital Signs ?BP 103/69  Pulse (!) 149   Temp (!) 97.5 ?F (36.4 ?C) (Rectal)   Resp 16  ?Physical Exam ?Vitals and nursing note reviewed.  ?Constitutional:   ?   Appearance: She is well-developed. She is ill-appearing.  ?HENT:  ?   Head: Normocephalic and atraumatic.  ?Eyes:  ?   Conjunctiva/sclera: Conjunctivae normal.  ?Cardiovascular:  ?   Rate and Rhythm: Regular rhythm. Tachycardia present.  ?Pulmonary:  ?   Effort: Pulmonary effort is normal. No respiratory distress.  ?   Breath sounds: Normal breath sounds. No stridor.  ?Abdominal:  ?   General: There is no distension.  ?   Tenderness: There is no abdominal tenderness. There is no guarding.  ?Skin: ?   General:  Skin is warm and dry.  ?Neurological:  ?   Mental Status: She is alert and oriented to person, place, and time.  ?   Cranial Nerves: No cranial nerve deficit.  ?Psychiatric:     ?   Mood and Affect: Mood normal.  ? ? ?ED Results / Procedures / Treatments   ?Labs ?(all labs ordered are listed, but only abnormal results are displayed) ?Labs Reviewed  ?CBG MONITORING, ED - Abnormal; Notable for the following components:  ?    Result Value  ? Glucose-Capillary 414 (*)   ? All other components within normal limits  ?COMPREHENSIVE METABOLIC PANEL  ?CBC WITH DIFFERENTIAL/PLATELET  ?URINALYSIS, ROUTINE W REFLEX MICROSCOPIC  ?BRAIN NATRIURETIC PEPTIDE  ?I-STAT VENOUS BLOOD GAS, ED  ? ? ?EKG ?EKG Interpretation ? ?Date/Time:  Monday January 25 2022 11:54:03 EDT ?Ventricular Rate:  155 ?PR Interval:    ?QRS Duration: 96 ?QT Interval:  308 ?QTC Calculation: 493 ?R Axis:   2 ?Text Interpretation: Atrial fibrillation with rapid V-rate Abnormal R-wave progression, early transition Repolarization abnormality, prob rate related Abnormal ECG Confirmed by Carmin Muskrat [phone]) on 01/25/2022 12:43:30 PM ? ?Radiology ?No results found. ? ?Procedures ?Procedures  ? ? ?Medications Ordered in ED ?Medications  ?sodium chloride 0.9 % bolus 1,000 mL (1,000 mLs Intravenous New Bag/Given 01/25/22 1213)  ?  And  ?0.9 %  sodium chloride infusion (has no administration in time range)  ? ? ?ED Course/ Medical Decision Making/ A&P ? ?This patient with a Hx of multiple medical issues including A-fib, diabetes presents to the ED for concern of weakness, hyperglycemia, syncope, this involves an extensive number of treatment options, and is a complaint that carries with it a high risk of complications and morbidity.   ? ?The differential diagnosis includes DKA, infection, sepsis, A-fib, RVR, hypovolemia ? ? ?Social Determinants of Health: ? ?Age ? ?Additional history obtained: ? ?Additional history and/or information obtained from EMS, notable for details  of HPI as above ? ? ?After the initial evaluation, orders, including: Labs, x-ray were initiated. ? ? ?Patient placed on Cardiac and Pulse-Oximetry Monitors. ?The patient was maintained on a cardiac monitor.  The cardiac monitored showed an rhythm of A-fib, rapid ventricular response, 150 ?The patient was also maintained on pulse oximetry. The readings were typically 97% room air normal ? ? ?On repeat evaluation of the patient improved ? ?Lab Tests: ? ?I personally interpreted labs.  The pertinent results include: Hypokalemia, acute kidney injury, hyperglycemia ? ?Imaging Studies ordered: ? ?I independently visualized and interpreted imaging which showed x-ray without notable findings ?I agree with the radiologist interpretation ? ?Consultations Obtained: ? ?I requested consultation with the internal medicine and cardiology,  and discussed lab and imaging findings as well as pertinent plan -  they recommend: Cardiology will see the patient ? ?3:39 PM ?Patient substantially better, heart rate has diminished from 150 now 110, though she remains in A-fib.  Blood pressure now improved, with a MAP 73, and after 2 L fluid resuscitation glucose has reduced to 150.  Patient's initial findings also concerning for acute kidney injury.  Given the improved heart rate, blood pressure, additional into rate control agents not currently indicated, pending patient receiving potassium supplementation, IV, oral and monitoring.  Cardiology is following along. ? ?Adult female presents after an episode of weakness with falls versus syncope.  Patient had multiple critical abnormalities including hypokalemia, acute kidney injury, hypotension.  Some suspicion for medication related effects given her substantial hypovolemia ongoing Lasix use as well as multiple other medical problems. ?Patient improved markedly with ED resuscitation, but will require stepdown admission for ongoing monitoring, management. ? ?Final Clinical Impression(s) / ED  Diagnoses ?Final diagnoses:  ?Hypokalemia  ?AKI (acute kidney injury) ([place])  ?Hypotension, unspecified hypotension type  ?CRITICAL CARE ?Performed by: Carmin Muskrat ?Total critical care time: 35 minutes ?[facility]

## 2022-01-25 NOTE — Assessment & Plan Note (Signed)
Minimally elevated, likely secondary to hypoperfusion from afib with RVR and hypotension  ?Continue to trend  ?

## 2022-01-25 NOTE — Assessment & Plan Note (Addendum)
repleted in ED orally +IV ?Magnesium pending  ?Have asked for stat then bmp q 4 hours-labs not done and have asked nursing 2x to get this done ?Follow and replete as needed  ?

## 2022-01-25 NOTE — Assessment & Plan Note (Signed)
Continue statin. 

## 2022-01-26 ENCOUNTER — Inpatient Hospital Stay: Payer: Self-pay

## 2022-01-26 DIAGNOSIS — I4891 Unspecified atrial fibrillation: Secondary | ICD-10-CM | POA: Diagnosis not present

## 2022-01-26 LAB — CREATININE, URINE, RANDOM: Creatinine, Urine: 94.35 mg/dL

## 2022-01-26 LAB — CBG MONITORING, ED: Glucose-Capillary: 245 mg/dL — ABNORMAL HIGH (ref 70–99)

## 2022-01-26 LAB — BASIC METABOLIC PANEL
Anion gap: 6 (ref 5–15)
BUN: 19 mg/dL (ref 8–23)
CO2: 20 mmol/L — ABNORMAL LOW (ref 22–32)
Calcium: 7.5 mg/dL — ABNORMAL LOW (ref 8.9–10.3)
Chloride: 106 mmol/L (ref 98–111)
Creatinine, Ser: 1.61 mg/dL — ABNORMAL HIGH (ref 0.44–1.00)
GFR, Estimated: 31 mL/min — ABNORMAL LOW (ref >60)
Glucose, Bld: 395 mg/dL — ABNORMAL HIGH (ref 70–99)
Potassium: 3.7 mmol/L (ref 3.5–5.1)
Sodium: 132 mmol/L — ABNORMAL LOW (ref 135–145)

## 2022-01-26 LAB — MAGNESIUM: Magnesium: 1.7 mg/dL (ref 1.7–2.4)

## 2022-01-26 LAB — GLUCOSE, CAPILLARY
Glucose-Capillary: 276 mg/dL — ABNORMAL HIGH (ref 70–99)
Glucose-Capillary: 298 mg/dL — ABNORMAL HIGH (ref 70–99)

## 2022-01-26 LAB — SODIUM, URINE, RANDOM: Sodium, Ur: 108 mmol/L

## 2022-01-26 MED ORDER — DIGOXIN 0.25 MG/ML IJ SOLN: 0.1250 mg | Freq: Once | INTRAMUSCULAR | Status: DC

## 2022-01-26 MED ORDER — CHLORHEXIDINE GLUCONATE CLOTH 2 % EX PADS
6.0000 | MEDICATED_PAD | Freq: Every day | CUTANEOUS | Status: DC
  Administered 2022-01-26 – 2022-01-27 (×2): 6 via TOPICAL

## 2022-01-26 MED ORDER — AMIODARONE LOAD VIA INFUSION
150.0000 mg | Freq: Once | INTRAVENOUS | Status: AC
  Administered 2022-01-26: 150 mg via INTRAVENOUS
  Filled 2022-01-26: qty 83.34

## 2022-01-26 MED ORDER — POTASSIUM CHLORIDE CRYS ER 20 MEQ PO TBCR
40.0000 meq | EXTENDED_RELEASE_TABLET | Freq: Two times a day (BID) | ORAL | Status: DC
  Administered 2022-01-26 – 2022-01-27 (×3): 40 meq via ORAL
  Filled 2022-01-26 (×3): qty 2

## 2022-01-26 MED ORDER — AMIODARONE IV BOLUS ONLY 150 MG/100ML: 150.0000 mg | Freq: Once | INTRAVENOUS | Status: DC

## 2022-01-26 MED ORDER — AMIODARONE HCL 200 MG PO TABS
400.0000 mg | ORAL_TABLET | Freq: Two times a day (BID) | ORAL | Status: DC
  Administered 2022-01-26 – 2022-01-27 (×3): 400 mg via ORAL
  Filled 2022-01-26 (×3): qty 2

## 2022-01-26 MED ORDER — SODIUM CHLORIDE 0.9% FLUSH: 10.0000 mL | INTRAVENOUS | Status: DC | PRN

## 2022-01-26 MED ORDER — SODIUM CHLORIDE 0.9% FLUSH
10.0000 mL | Freq: Two times a day (BID) | INTRAVENOUS | Status: DC
  Administered 2022-01-26 – 2022-01-27 (×2): 10 mL

## 2022-01-26 MED ORDER — LIDOCAINE 5 % EX PTCH
1.0000 | MEDICATED_PATCH | CUTANEOUS | Status: DC
  Administered 2022-01-26 – 2022-01-27 (×2): 1 via TRANSDERMAL
  Filled 2022-01-26 (×2): qty 1

## 2022-01-26 NOTE — Progress Notes (Signed)
Peripherally Inserted Central Catheter Placement ? ?The IV Nurse has discussed with the patient and/or persons authorized to consent for the patient, the purpose of this procedure and the potential benefits and risks involved with this procedure.  The benefits include less needle sticks, lab draws from the catheter, and the patient may be discharged home with the catheter. Risks include, but not limited to, infection, bleeding, blood clot (thrombus formation), and puncture of an artery; nerve damage and irregular heartbeat and possibility to perform a PICC exchange if needed/ordered by physician.  Alternatives to this procedure were also discussed.  Bard Power PICC patient education guide, fact sheet on infection prevention and patient information card has been provided to patient /or left at bedside.   ? ?PICC Placement Documentation  ?PICC Double Lumen 01/26/22 Right Brachial 37 cm 1 cm (Active)  ?Indication for Insertion or Continuance of Line Poor Vasculature-patient has had multiple peripheral attempts or PIVs lasting less than 24 hours 01/26/22 1815  ?Exposed Catheter (cm) 1 cm 01/26/22 1815  ?Site Assessment Clean, Dry, Intact 01/26/22 1815  ?Lumen #1 Status Flushed;Saline locked;Blood return noted 01/26/22 1815  ?Lumen #2 Status Flushed;Saline locked;Blood return noted 01/26/22 1815  ?Dressing Type Securing device;Transparent 01/26/22 1815  ?Dressing Status Antimicrobial disc in place;Clean, Dry, Intact 01/26/22 1815  ?Dressing Intervention New dressing 01/26/22 1815  ?Dressing Change Due 02/02/22 01/26/22 1815  ? ? ? ? ? ?Shon Hale ?01/26/2022, 6:38 PM ? ?

## 2022-01-26 NOTE — ED Notes (Signed)
Pt HR remains irregular with rate ranging between 110 and 150. RN sent secure chat to Dr Verlon Au with update and to ask if further intervention was warranted. ?

## 2022-01-26 NOTE — ED Notes (Signed)
Breakfast order placed ?
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
## 2022-01-26 NOTE — Progress Notes (Signed)
Inpatient Diabetes Program Recommendations ? ?AACE/ADA: New Consensus Statement on Inpatient Glycemic Control (2015) ? ?Target Ranges:  Prepandial:   less than 140 mg/dL ?     Peak postprandial:   less than 180 mg/dL (1-2 hours) ?     Critically ill patients:  140 - 180 mg/dL  ? ?Lab Results  ?Component Value Date  ? GLUCAP 245 (H) 01/26/2022  ? HGBA1C 12.4 (H) 01/25/2022  ? ? ?Review of Glycemic Control ? Latest Reference Range & Units 01/25/22 15:09 01/25/22 19:22 01/26/22 07:54  ?Glucose-Capillary 70 - 99 mg/dL 151 (H) 111 (H) 245 (H)  ?(H): Data is abnormally high ?Diabetes history: LADA treated as Type 1 DM ?Outpatient Diabetes medications: Novolog 8 units TID, Toujeo 12 units QD ?Current orders for Inpatient glycemic control: Semglee 10 units QD, Novolog 0-9 units TID ? ?Inpatient Diabetes Program Recommendations:   ? ?Spoke with patient and daughter regarding outpatient diabetes management. Is followed by outpatient endocrinology with next appointment on 02/10/22. Denies missing doses. ?Reviewed patient's current A1c of 12.4% which is down from >15%. Patient attributes this to switching endocrinologists. Explained what a A1c is and what it measures. Also reviewed goal A1c with patient, importance of good glucose control @ home, and blood sugar goals. Reviewed patho of DM, role of pancreas, increased risk for infection, vascular changes and commorbidities.  ?Patient denies drinking sugary beverages. However, per patient's daughter is "really bad about not eating meals and snacks/grazes all day". In agreement that this would contribute to elevations in blood sugar especially if carbohydrates are not being covered with insulin. Outpatient endocrinology has given the patient a sliding scale and had discouraged patient from covering snacks. Education provided on carb coverage. Stressed the importance of reaching out to endocrinology with elevated sugar readings.  ?Patient wears Freestyle libre and reports blood  sugars are 120's-200's mg/dL. States that she checks throughout the day. Denies checking with fingerstick. Did not bring reader to the hospital. Encouraged to check occasionally with fingersticks and to scan following snacks consumed.  ?Will follow trends.  ? ?Thanks, ?Bronson Curb, MSN, RNC-OB ?Diabetes Coordinator ?[phone] (8a-5p) ? ? ? ?

## 2022-01-26 NOTE — Hospital Course (Addendum)
[name] is an 87 y.o. F with Afib on Eliquis, HTN, HLD, hypothyroidism, and DM who presented for generalized malaise for several weeks, then passing out.   ? ?Evidently has had decreased PO intake and malaise for several weeks.  Seen twice in the ER prior to this admission, both times for chief complaint documented as "chest pain" or "back pain".  ? ?EMS called twice to the house, second time patient had a syncopal episode and was found to be in Afib with RVR, glucose elevated anion gap elevated, Cr up to 2.1, BP low. ? ?Started on fluids, amiodarone gtt, subcutaneous insulin, and admitted for AKI, hypotension, and rapid afib. ?

## 2022-01-26 NOTE — Telephone Encounter (Signed)
Karen Casino, MD  Pugh, Lucianne Muss D, LPN 17 hours ago (28:24 PM)  ? ?She is now in the hospital - has been seen by cardiology. I would advise she stay on Eliquis, although she now apparently qualifies for the lower 2.5 mg BID dose.  ? ?Dr. Lemmie Evens   ? ?

## 2022-01-26 NOTE — ED Notes (Signed)
IV team came down to assess for PIV but unable to start d/t poor vasculature. EDP put in for IR PICC insertion but IR stated IV team needs to try and pt only goes to IR if IV team unsuccessful. IV team consult placed. ?

## 2022-01-26 NOTE — Progress Notes (Incomplete)
87 year old white female community dwelling ?HTN HLD DM TY 2 ?Known atrial flutter atrial fibrillation CHA2DS2-VASc 2 > 4 on anticoagulant apixaban ?Most recent admission 11/21 through 09/04/2021 with hyperglycemic nonketotic state secondary to malfunction of insulin pump ? ?Return to emergency room 01/25/2022 not feeling well with high sugar for several days-sounds like she had a syncopal episode found to be in A-fib RVR yet again 4/17 no fever no chills no chest pain no nausea vomiting diarrhea ?Most recently treated for UTI about a month ago also recent visit to sports medicine for musculoskeletal chest pain which has resolved ? ?On arrival in the ED found to have potassium 2.4 CO2 17 LFTs marginally elevated ?BNP 555 ?Patient was hypotensive with blood pressure in the 70s and cardiology Dr. Emeterio Reeve was consulted ? ?Because of hypotension and inability to use nodal agent was started on IV amiodarone TSH was checked and secondary to AKI patient was given IV fluid and potassium was repleted ?

## 2022-01-26 NOTE — Progress Notes (Signed)
?PROGRESS NOTE ? ? [name]  BMW:[id number] DOB: [birth year] DOA: 01/25/2022 ?PCP: Deland Pretty, MD  ?Brief Narrative:  ? ?87 year old white female community dwelling ?HTN HLD DM TY 2 ?Known atrial flutter atrial fibrillation CHA2DS2-VASc 2 > 4 on anticoagulant apixaban ?Most recent admission 11/21 through 09/04/2021 with hyperglycemic nonketotic state secondary to malfunction of insulin pump ?  ?Return to emergency room 01/25/2022 malaise, hyperglycemia 600?  Episodic syncopal episode ? found to be in A-fib RVR yet again 4/17 no fever no chills no chest pain no nausea vomiting diarrhea ?Most recently treated for UTI about a month ago also recent visit to sports medicine for musculoskeletal chest pain which has resolved ? ?On arrival in the ED found to have potassium 2.4 CO2 17 LFTs marginally elevated ?BNP 555 ?Patient was hypotensive with blood pressure in the 70s and cardiology Dr. Emeterio Reeve was consulted ?  ?Because of hypotension and inability to use nodal agent was started on IV amiodarone TSH was checked and secondary to AKI patient was given IV fluid and potassium was repleted ? ?Hospital-Problem based course ? ?Hypotensive on admission ?A-fib RVR with CHA2DS2-VASc of 2 > 4 ?No IV access and PICC line is pending after multiple attempts at obtaining access ?Load Amio 400 twice daily per cardiology ?Replace potassium to above 4, magnesium to above 2.1 ?Continue Eliquis 2.5 twice daily ?DKA nonfunctioning pump? ?DKA seems to have resolved but I would like to get another be met to ensure the gap remains closed  ?I think however she can probably eat ?Continuing sliding scale coverage Semglee insulin 10 units and adjust as needed ?AKI on admit which probably caused hypotension-likely component of DKA physiology causing volume depletion ?hypokalemia on admission ?Continue K-Dur 40 twice daily-see above discussion ?Holding ARB Maxide Lasix ? ?DVT prophylaxis: Eliquis ?Code Status: Full ?Family Communication:  Discussed with the daughter at the bedside ?Disposition:  ?Status is: Inpatient ?Remains inpatient appropriate because:  ? ?Arrhythmia, electrolyte disturbance ?  ?Consultants:  ? ? ?Procedures:  ? ?Antimicrobials:   ? ? ?Subjective: ? ? ?Seeninitially in the ED and was quite tachycardic-because no IV could be placed for several hours we loaded her with oral amiodarone and consider digoxin however we have held the digoxin at this time ?Labs are still pending at this time 5 PM because PICC line is about to be placed ?Her rate is reasonably controlled ?She may need other options in terms of management of her diabetes and alternatives to her pump ? ?Objective: ?Vitals:  ? 01/26/22 0830 01/26/22 0900 01/26/22 1130 01/26/22 1450  ?BP: 108/77 119/75 111/65   ?Pulse:  (!) 125 (!) 101 (!) 101  ?Resp: $Remov'13 16 14   'dFnayZ$ ?Temp:    98.5 ?F (36.9 ?C)  ?TempSrc:    Oral  ?SpO2:  95% 96%   ? ? ?Intake/Output Summary (Last 24 hours) at 01/26/2022 1746 ?Last data filed at 01/26/2022 1600 ?Gross per 24 hour  ?Intake 1534.46 ml  ?Output --  ?Net 1534.46 ml  ? ?There were no vitals filed for this visit. ? ?Examination: ? ?Awake coherent no distress frail black female on oxygen ?S1-S2 tachycardic ?Abdomen soft no rebound no guarding ?ROM intact no lower extremity edema ?Neuro intact ? ?Data Reviewed: personally reviewed  ? ?CBC ?   ?Component Value Date/Time  ? WBC 10.9 (H) 01/25/2022 2235  ? RBC 4.48 01/25/2022 2235  ? HGB 12.9 01/25/2022 2242  ? HCT 38.0 01/25/2022 2242  ? PLT 191 01/25/2022 2235  ? MCV 89.7 01/25/2022 2235  ?  MCH 29.7 01/25/2022 2235  ? MCHC 33.1 01/25/2022 2235  ? RDW 13.6 01/25/2022 2235  ? LYMPHSABS 1.8 01/25/2022 2235  ? MONOABS 0.7 01/25/2022 2235  ? EOSABS 0.0 01/25/2022 2235  ? BASOSABS 0.0 01/25/2022 2235  ? ? ?  Latest Ref Rng & Units 01/25/2022  ? 10:42 PM 01/25/2022  ? 10:35 PM 01/25/2022  ?  1:37 PM  ?CMP  ?Glucose 70 - 99 mg/dL  173     ?BUN 8 - 23 mg/dL  26     ?Creatinine 0.44 - 1.00 mg/dL  1.77     ?Sodium 135 -  145 mmol/L 134   133   133    ?Potassium 3.5 - 5.1 mmol/L 3.2   3.4   2.5    ?Chloride 98 - 111 mmol/L  104     ?CO2 22 - 32 mmol/L  20     ?Calcium 8.9 - 10.3 mg/dL  8.5     ? ? ? ?Radiology Studies: ?DG Chest Port 1 View ? ?Result Date: 01/25/2022 ?CLINICAL DATA:  Hyperglycemia. EXAM: PORTABLE CHEST 1 VIEW COMPARISON:  January 01, 2022. FINDINGS: The heart size and mediastinal contours are within normal limits. Both lungs are clear. The visualized skeletal structures are unremarkable. IMPRESSION: No active disease. Electronically Signed   By: Marijo Conception M.D.   On: 01/25/2022 13:10  ? ?[place] EKG SITE RITE ? ?Result Date: 01/26/2022 ?If [employer] not attached, placement could not be confirmed due to current cardiac rhythm.  ? ? ?Scheduled Meds: ? amiodarone  400 mg Oral BID  ? apixaban  2.5 mg Oral BID  ? atorvastatin  20 mg Oral Daily  ? digoxin  0.125 mg Intravenous Once  ? insulin aspart  0-9 Units Subcutaneous TID WC  ? insulin glargine-yfgn  10 Units Subcutaneous Daily  ? levothyroxine  75 mcg Oral QAC breakfast  ? lidocaine  1 patch Transdermal Q24H  ? potassium chloride  40 mEq Oral BID  ? ?Continuous Infusions: ? amiodarone 30 mg/hr (01/26/22 0806)  ? ? ? LOS: 1 day  ? ?Time spent: 43 ? ?Nita Sells, MD ?[facility] ?To contact the attending provider between 7A-7P or the covering provider during after hours 7P-7A, please log into the web site [email or web address] and access using universal [place] password for that web site. If you do not have the password, please call the hospital operator. ? ?01/26/2022, 5:46 PM  ? ? ?

## 2022-01-26 NOTE — ED Notes (Signed)
Karen Kettle, MD notified of pt BP & HR ?

## 2022-01-27 ENCOUNTER — Other Ambulatory Visit: Payer: Self-pay

## 2022-01-27 DIAGNOSIS — E7800 Pure hypercholesterolemia, unspecified: Secondary | ICD-10-CM

## 2022-01-27 DIAGNOSIS — N1832 Chronic kidney disease, stage 3b: Secondary | ICD-10-CM

## 2022-01-27 DIAGNOSIS — I4891 Unspecified atrial fibrillation: Secondary | ICD-10-CM | POA: Diagnosis not present

## 2022-01-27 DIAGNOSIS — I10 Essential (primary) hypertension: Secondary | ICD-10-CM | POA: Diagnosis not present

## 2022-01-27 LAB — GLUCOSE, CAPILLARY
Glucose-Capillary: 132 mg/dL — ABNORMAL HIGH (ref 70–99)
Glucose-Capillary: 198 mg/dL — ABNORMAL HIGH (ref 70–99)

## 2022-01-27 LAB — COMPREHENSIVE METABOLIC PANEL WITH GFR
ALT: 31 U/L (ref 0–44)
AST: 25 U/L (ref 15–41)
Albumin: 3 g/dL — ABNORMAL LOW (ref 3.5–5.0)
Alkaline Phosphatase: 46 U/L (ref 38–126)
Anion gap: 6 (ref 5–15)
BUN: 15 mg/dL (ref 8–23)
CO2: 21 mmol/L — ABNORMAL LOW (ref 22–32)
Calcium: 7.9 mg/dL — ABNORMAL LOW (ref 8.9–10.3)
Chloride: 106 mmol/L (ref 98–111)
Creatinine, Ser: 1.52 mg/dL — ABNORMAL HIGH (ref 0.44–1.00)
GFR, Estimated: 34 mL/min — ABNORMAL LOW
Glucose, Bld: 156 mg/dL — ABNORMAL HIGH (ref 70–99)
Potassium: 3.9 mmol/L (ref 3.5–5.1)
Sodium: 133 mmol/L — ABNORMAL LOW (ref 135–145)
Total Bilirubin: 0.4 mg/dL (ref 0.3–1.2)
Total Protein: 5.5 g/dL — ABNORMAL LOW (ref 6.5–8.1)

## 2022-01-27 LAB — CBC
HCT: 28.6 % — ABNORMAL LOW (ref 36.0–46.0)
Hemoglobin: 10 g/dL — ABNORMAL LOW (ref 12.0–15.0)
MCH: 30.1 pg (ref 26.0–34.0)
MCHC: 35 g/dL (ref 30.0–36.0)
MCV: 86.1 fL (ref 80.0–100.0)
Platelets: 170 K/uL (ref 150–400)
RBC: 3.32 MIL/uL — ABNORMAL LOW (ref 3.87–5.11)
RDW: 14.2 % (ref 11.5–15.5)
WBC: 6 K/uL (ref 4.0–10.5)
nRBC: 0 % (ref 0.0–0.2)

## 2022-01-27 LAB — URINE CULTURE: Culture: 50000 — AB

## 2022-01-27 LAB — CK: Total CK: 175 U/L (ref 38–234)

## 2022-01-27 LAB — MAGNESIUM: Magnesium: 1.9 mg/dL (ref 1.7–2.4)

## 2022-01-27 MED ORDER — MAGNESIUM SULFATE 2 GM/50ML IV SOLN
2.0000 g | Freq: Once | INTRAVENOUS | Status: AC
  Administered 2022-01-27: 2 g via INTRAVENOUS
  Filled 2022-01-27: qty 50

## 2022-01-27 MED ORDER — AMIODARONE HCL 200 MG PO TABS: ORAL_TABLET | ORAL | 0 refills | Status: DC

## 2022-01-27 NOTE — TOC Initial Note (Signed)
Transition of Care (TOC) - Initial/Assessment Note  ? ? ?Patient Details  ?Name: [name] ?MRN: [id number] ?Date of Birth: [birth year] ? ?Transition of Care (TOC) CM/SW Contact:    ?Graves-Bigelow, Ocie Cornfield, RN ?Phone Number: ?01/27/2022, 12:39 PM ? ?Clinical Narrative: Risk for readmission assessment completed. PTA patient was from home alone. Patient has support of daughter Santiago Glad and granddaughter Niger. Niger will be staying with the patient starting on Thursday throughout the weekend. Daughter Santiago Glad is at the bedside and she expressed that additional family members will be assisting with care and supervision in the home. Case Manager discussed home health services-patient and daughter agreeable to services. Medicare.gov list provided to the family and daughter chose [place]. Referral submitted to [facility] and the agency can service the patient. Start of care to begin within 24-48 hours post transition home. Daughter to provide transportation home via private vehicle. No further needs identified at this time.              ? ? ?Expected Discharge Plan: [place] ?Barriers to Discharge: No Barriers Identified ? ? ?Patient Goals and CMS Choice ?Patient states their goals for this hospitalization and ongoing recovery are:: to return home with home health services. ?CMS Medicare.gov Compare Post Acute Care list provided to:: Patient ?Choice offered to / list presented to : Adult Children, Patient ? ?Expected Discharge Plan and Services ?Expected Discharge Plan: [place] ?In-house Referral: NA ?Discharge Planning Services: CM Consult ?Post Acute Care Choice: Home Health ?Living arrangements for the past 2 months: [place] ?Expected Discharge Date: 01/27/22               ?DME Arranged: N/A ?  ?  ?  ?  ?HH Arranged: RN, Disease Management, PT, OT ?[place] Agency: [place] ?Date HH Agency Contacted: 01/27/22 ?Time [place]: [phone] ?Representative spoke with  at [place]: Tommi Rumps ? ?Prior Living Arrangements/Services ?Living arrangements for the past 2 months: [place] ?Lives with:: Self (has support of daughter and granddaughter.) ?Patient language and need for interpreter reviewed:: Yes ?Do you feel safe going back to the place where you live?: Yes      ?Need for Family Participation in Patient Care: Yes (Comment) ?Care giver support system in place?: Yes (comment) ?Current home services: DME (has a cane and riser for toilet.) ?Criminal Activity/Legal Involvement Pertinent to Current Situation/Hospitalization: No - Comment as needed ? ?Activities of Daily Living ?Home Assistive Devices/Equipment: None ?ADL Screening (condition at time of admission) ?Patient's cognitive ability adequate to safely complete daily activities?: Yes ?Is the patient deaf or have difficulty hearing?: No ?Does the patient have difficulty seeing, even when wearing glasses/contacts?: No ?Does the patient have difficulty concentrating, remembering, or making decisions?: No ?Patient able to express need for assistance with ADLs?: Yes ?Does the patient have difficulty dressing or bathing?: No ?Independently performs ADLs?: Yes (appropriate for developmental age) ?Does the patient have difficulty walking or climbing stairs?: No ?Weakness of Legs: None ?Weakness of Arms/Hands: None ? ?Permission Sought/Granted ?Permission sought to share information with : Family Supports, Customer service manager, Case Manager ?Permission granted to share information with : Yes, Verbal Permission Granted ?   ? Permission granted to share info w AGENCY: [facility] ?   ?   ? ?Emotional Assessment ?Appearance:: Appears stated age ?Attitude/Demeanor/Rapport: Engaged ?Affect (typically observed): Appropriate ?Orientation: : Oriented to Situation, Oriented to  Time, Oriented to Place, Oriented to Self ?Alcohol / Substance Use:  Not Applicable ?Psych Involvement: No (comment) ? ?Admission diagnosis:  Hypokalemia  [E87.6] ?Atrial fibrillation with rapid ventricular response ([place]) [I48.91] ?AKI (acute kidney injury) ([place]) [N17.9] ?Hypotension, unspecified hypotension type [I95.9] ?Patient Active Problem List  ? Diagnosis Date Noted  ? Stage 3b chronic kidney disease (CKD) ([place]) 01/27/2022  ? Atrial fibrillation with rapid ventricular response ([place]) 01/25/2022  ? Elevated transaminase level 01/25/2022  ? Hypokalemia 09/02/2021  ? Acute renal failure superimposed on stage 3b chronic kidney disease ([place]) 09/01/2021  ? COVID-19 virus infection 03/13/2021  ? DKA (diabetic ketoacidosis) ([place]) 03/13/2021  ? Hyperglycemia 03/23/2020  ? LADA (latent autoimmune diabetes in adults), managed as type 1 ([place]) 10/26/2018  ? Hypercholesterolemia 03/15/2018  ? Left hip pain 06/07/2017  ? Hypothyroidism 02/03/2015  ? Essential hypertension 07/10/2013  ? Type 2 diabetes mellitus ([place]) 07/10/2013  ? Upper respiratory infection 07/10/2013  ? PAF (paroxysmal atrial fibrillation) ([place]) 12/26/2012  ? Postop check 04/13/2011  ? ?PCP:  Deland Pretty, MD ?Pharmacy:   ?[id number] [place] [place] - [address] [id number]?Phone: [phone]: [phone]? ? ? ? ?Social Determinants of Health (SDOH) Interventions ?  ? ?Readmission Risk Interventions ? ?  01/27/2022  ? 12:37 PM  ?Readmission Risk Prevention Plan  ?Transportation Screening Complete  ?PCP or Specialist Appt within 3-5 Days Complete  ?[facility] Home Care Consult Complete  ?Social Work Consult for ROtter TailPlanning/Counseling Complete  ?Palliative Care Screening Not Applicable  ?Medication Review (Press photographer Complete  ? ? ? ?

## 2022-01-27 NOTE — Discharge Summary (Signed)
?Physician Discharge Summary ?  ?Patient: [name] MRN: [id number] DOB: 05-Aug-1935  ?Admit date:     01/25/2022  ?Discharge date: 01/27/22  ?Discharge Physician: Edwin Dada  ? ?PCP: Deland Pretty, MD  ? ? ? ?Recommendations at discharge:  ?Follow up with PCP Dr. Shelia Media in 1 week ?Dr. Shelia Media: Please check BP and resume olmesartan, HCTZ, and/or metoprolol as needed ?Follow up with Cardiology re: new amiodarone in 1-2 months ?Follow up with Dr. Janese Banks, Endocrinology in 2 weeks ?Dr. Janese Banks: Please check TSH, T3, fT4 and review levothyroxine usage ?Dr. Janese Banks: Please review CGM and insulin adherence ?Refer for cognitive testing due to ?mild cognitive impairment ? ? ? ? ?Discharge Diagnoses: ?Principal Problem: ?  Atrial fibrillation with rapid ventricular response ([place]) ?Active Problems: ?  LADA (latent autoimmune diabetes in adults)  ?  Acute renal failure superimposed on stage 3b chronic kidney disease ([place]) ?  Hypokalemia ?  Elevated transaminase level ?  Essential hypertension ?  Hypothyroidism ?  Hyperlipidemia ?  Stage 3b chronic kidney disease (CKD) ([place]) ? ?  ? ? ?Hospital Course: ?[name] is an 87 y.o. F with Afib on Eliquis, HTN, HLD, hypothyroidism, and DM who presented for generalized malaise for several weeks, then passing out.   ? ?Evidently has had decreased PO intake and malaise for several weeks.  Seen twice in the ER prior to this admission, both times for chief complaint documented as "chest pain" or "back pain".  ? ?EMS called twice to the house, second time patient had a syncopal episode and was found to be in Afib with RVR, glucose elevated anion gap elevated, Cr up to 2.1, BP low. ? ?Started on fluids, amiodarone gtt, subcutaneous insulin, and admitted for AKI, hypotension, and rapid afib. ? ? ? ? ? ?  ?* Atrial fibrillation with rapid ventricular response ([place]) ?Admitted and started on amiodarone.  HR converted to NSR. ? ?Cardiology recommended 400 mg BID for 1 week then 200 mg BID for 1  week then 200 mg daily ongoing. ? ?I question whether this rapid atrial fibrillation was driven the hyperglycemia/dehydration, which was corrected.  Given thyroid disease, low threshold to stop and return to metoprolol and diltiazem. ? ? ? ? ? ? ? 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  A1c here 12%, up from last Nov, consistent with average glucose 300. ? ?CGM reported at her Endocrinologists office recently was 60% of the time in very high range.  I do not think she is brittle, I suspect she is missing/forgetting doses.  ? ?DKA ruled out ? ? ? ?Acute renal failure superimposed on stage 3b chronic kidney disease ([place]) ?Likely pre-renal given chronic hyperglycemia, osmotic diuresis and olmesartan.   ? ?Here, ARB held, got fluids and Cr corrected to normal. ? ? ?Hypothyroidism ?TSH elevated.  I suspect she is missing doses (she denies vehemently) or equally likely, taking with food or other medications. ?- Follow up with endocrinology ? ?Mild cognitive impairment suspected ?Patient independent and still working.  However, she has a complicated medical regimen and would benefit from referral for neuropsychiatric testing to test for early dementia or mild cognitive impairment. ? ? ? ? ?  ? ? ? ? ? ?The [facility] Controlled Substances Registry was reviewed for this patient prior to discharge.  ? ?Consultants: Cardiology ?  ?Disposition: Home ?Diet recommendation:  ?Discharge Diet Orders (From admission, onward)  ? ?  Start     Ordered  ?  01/27/22 0000  Diet - low sodium heart healthy       ? 01/27/22 1159  ? ?  ?  ? ?  ? ?Carb modified diet ? ?DISCHARGE MEDICATION: ?Allergies as of 01/27/2022   ?No Known Allergies ?  ? ?  ?Medication List  ?  ? ?STOP taking these medications   ? ?acetaminophen-codeine 300-30 MG tablet ?Commonly known as: TYLENOL #3 ?  ?furosemide 20 MG tablet ?Commonly known as: LASIX ?  ?metoprolol succinate 25 MG 24 hr tablet ?Commonly known as: Toprol XL ?   ?olmesartan 20 MG tablet ?Commonly known as: BENICAR ?  ?triamterene-hydrochlorothiazide 37.5-25 MG tablet ?Commonly known as: MAXZIDE-25 ?  ? ?  ? ?TAKE these medications   ? ?amiodarone 200 MG tablet ?Commonly known as: PACERONE ?Take amiodarone 400 mg (two tabs) twice daily for 1 week then take 200 mg (1 tab) twice daily for 1 week then take 200 mg (1 tab) once daily from then on ?  ?atorvastatin 20 MG tablet ?Commonly known as: LIPITOR ?Take 20 mg by mouth daily. ?  ?Eliquis 2.5 MG Tabs tablet ?Generic drug: apixaban ?TAKE 1 TABLET BY MOUTH TWICE A DAY ?What changed: how much to take ?  ?levothyroxine 75 MCG tablet ?Commonly known as: SYNTHROID ?Take 75 mcg by mouth daily before breakfast. ?  ?NovoLOG FlexPen 100 UNIT/ML FlexPen ?Generic drug: insulin aspart ?Inject 8 Units into the skin 3 (three) times daily with meals. ?  ?Tiadylt ER 180 MG 24 hr capsule ?Generic drug: diltiazem ?TAKE 1 CAPSULE BY MOUTH EVERY DAY ?What changed: how much to take ?  ?Toujeo Max SoloStar 300 UNIT/ML Solostar Pen ?Generic drug: insulin glargine (2 Unit Dial) ?Inject 12 Units into the skin daily. ?  ? ?  ? ? Follow-up Information   ? ? Wallene Huh, MD. Go in 2 week(s).   ?Specialty: Endocrinology ?Contact information: ?[address] ?[place] [address] ?[phone] ? ? ?  ?  ? ? Deland Pretty, MD. Schedule an appointment as soon as possible for a visit in 1 week(s).   ?Specialty: Internal Medicine ?Why: for BP check ?Contact information: ?[place] [address] ?[phone] ? ? ?  ?  ? ? Pixie Casino, MD Follow up in 1 month(s).   ?Specialty: Cardiology ?Why: For follow up of new amiodarone ?Contact information: ?[address] ?[address] ?[place] [address] ?[phone] ? ? ?  ?  ? ? Care, [facility] Follow up.   ?Specialty: Home Health Services ?Why: Registered Nurse, Physical Therapy, Occupational Therapy-office to call with visit times. ?Contact  information: ?[address] ?[place] [address] ?[phone] ? ? ?  ?  ? ?  ?  ? ?  ? ? ?Discharge Instructions   ? ? Diet - low sodium heart healthy   Complete by: As directed ?  ? Discharge instructions   Complete by: As directed ?  ? From Dr. Loleta Books: ?You were admitted for low blood pressure, high blood sugar, kidney injury and atrial fibrillation. ? ?It appears that the low blood pressure, kidney injury and atrial fibrillation were caused by peeing so much that you became dehydrated.   ? ?The thyroid level may also be related. ? ?To correct the issue, we restarted your insulin and your sugars were controlled. ?We also started a heart rate controlling medicine amiodarone ? ?Amiodarone starts with a taper: ?Take amiodarone 400 mg twice daily for 1 week ?Next Wednesday, reduce to  200 mg twice daily for 1 week ?On Wednesday May 3rd, reduce to 200 mg once daily and keep going on that indefinitely ?See your heart doctor within 1 month ?Ask them for refills ? ? ?Also, for the next week, hold off on taking your metoprolol, olmesartan and HCTZ ?Go see Dr. Shelia Media in 1 week and ask him if you should restart any or all of these ? ? ? ? ?MEDICATION SCHEDULE ? ?First thing in the morning: ?Levothyroxine ? ? ?Morning (at least 2 hours after thyroid pill): ?Apixaban/Eliquis ?Amiodarone ?Diltiazem/Tiadylt ? ?This would also be a good time to take the Tuojeo, long-acting base insulin ? ? ?Evening before bed: ?Apixaban/Eliquis ?Amiodarone (for the first two weeks, when you are taking it twice daily) ?Atorvastatin/Lipitor ? ? ?When you take levothyroxine, it is USUALLY taken 2 hours before food or coffee and at least 2 hours before or after ANY other medicines or supplements ?Sometimes this doesn't work for people, and you can work with Dr. Janese Banks in that case to come up with a schedule that works better ? ? ? ?Also, when you resume your metoprolol and olmesartan and HCTZ, they will probably go in the morning group with  the morning apixaban and amiodarone  ? Increase activity slowly   Complete by: As directed ?  ? ?  ? ? ?Discharge Exam: ?There were no vitals filed for this visit. ? ?General: Pt is alert, awake, not in acute distress

## 2022-01-27 NOTE — Progress Notes (Signed)
Inpatient Diabetes Program Recommendations ? ?AACE/ADA: New Consensus Statement on Inpatient Glycemic Control (2015) ? ?Target Ranges:  Prepandial:   less than 140 mg/dL ?     Peak postprandial:   less than 180 mg/dL (1-2 hours) ?     Critically ill patients:  140 - 180 mg/dL  ? ?Lab Results  ?Component Value Date  ? GLUCAP 132 (H) 01/27/2022  ? HGBA1C 12.4 (H) 01/25/2022  ? ? ?Review of Glycemic Control ? Latest Reference Range & Units 01/26/22 07:54 01/26/22 17:00 01/26/22 21:13 01/27/22 07:41  ?Glucose-Capillary 70 - 99 mg/dL 245 (H) 276 (H) 298 (H) 132 (H)  ? ?Diabetes history: LADA treated as Type 1 DM ?Outpatient Diabetes medications: Novolog 8 units TID, Toujeo 12 units QD ?Current orders for Inpatient glycemic control:  ?Semglee 10 units QD ?Novolog 0-9 units TID ? ?Inpatient Diabetes Program Recommendations:   ? ?- Consider adding Novolog 3 units tid meal coverage if eating >50% of meals ? ?DM coordinator spoke with pt and daughter on 4/18.   ? ?Thanks, ?Tama Headings RN, MSN, BC-ADM ?Inpatient Diabetes Coordinator ?Team Pager [phone] (8a-5p) ?

## 2022-01-27 NOTE — Telephone Encounter (Signed)
Patient still currently admitted.  

## 2022-01-27 NOTE — Progress Notes (Addendum)
? ?Progress Note ? ?Patient Name: [name] ?Date of Encounter: 01/27/2022 ? ?[facility] Cardiologist: Pixie Casino, MD  ? ?Subjective  ? ?Lots of questions this morning. No pain. Now back in SR this morning.  ? ?Inpatient Medications  ?  ?Scheduled Meds: ? amiodarone  400 mg Oral BID  ? apixaban  2.5 mg Oral BID  ? atorvastatin  20 mg Oral Daily  ? Chlorhexidine Gluconate Cloth  6 each Topical Daily  ? insulin aspart  0-9 Units Subcutaneous TID WC  ? insulin glargine-yfgn  10 Units Subcutaneous Daily  ? levothyroxine  75 mcg Oral QAC breakfast  ? lidocaine  1 patch Transdermal Q24H  ? potassium chloride  40 mEq Oral BID  ? sodium chloride flush  10-40 mL Intracatheter Q12H  ? ?Continuous Infusions: ? ?PRN Meds: ?acetaminophen **OR** acetaminophen, sodium chloride flush  ? ?Vital Signs  ?  ?Vitals:  ? 01/26/22 2012 01/26/22 2340 01/27/22 0459 01/27/22 0732  ?BP: 98/60 112/65 112/65 (!) 114/57  ?Pulse:  70 72 67  ?Resp: (!) '23 15 14 18  '$ ?Temp: 98.3 ?F (36.8 ?C) 97.8 ?F (36.6 ?C) 98.3 ?F (36.8 ?C) 97.9 ?F (36.6 ?C)  ?TempSrc: Oral Oral Oral Oral  ?SpO2:  100% 100% 100%  ? ? ?Intake/Output Summary (Last 24 hours) at 01/27/2022 1010 ?Last data filed at 01/26/2022 2106 ?Gross per 24 hour  ?Intake 514.28 ml  ?Output --  ?Net 514.28 ml  ? ? ?  01/16/2022  ?  2:25 PM 12/31/2021  ?  1:41 PM 12/10/2021  ? 10:14 AM  ?Last 3 Weights  ?Weight (lbs) 143 lb 144 lb 6.4 oz 147 lb 3.2 oz  ?Weight (kg) 64.864 kg 65.499 kg 66.769 kg  ?   ? ?Telemetry  ?  ?Converted to SR this morning - Personally Reviewed ? ?ECG  ?  ?No new tracing ? ?Physical Exam  ? ?GEN: No acute distress. Sitting up in bed.  ?Neck: No JVD ?Cardiac: RRR, no murmurs, rubs, or gallops.  ?Respiratory: Clear to auscultation bilaterally. ?GI: Soft, nontender, non-distended  ?MS: No edema; No deformity. ?Neuro:  Nonfocal  ?Psych: Normal affect  ? ?Labs  ?  ?High Sensitivity Troponin:   ?Recent Labs  ?Lab 01/01/22 ?1037 01/01/22 ?1201  ?TROPONINIHS 4 5  ?    ?Chemistry ?Recent Labs  ?Lab 01/25/22 ?1327 01/25/22 ?1337 01/25/22 ?1700 01/25/22 ?2235 01/25/22 ?2242 01/26/22 ?1817 01/27/22 ?0500  ?NA 135   < >  --  133* 134* 132* 133*  ?K 2.4*   < >  --  3.4* 3.2* 3.7 3.9  ?CL 99  --   --  104  --  106 106  ?CO2 17*  --   --  20*  --  20* 21*  ?GLUCOSE 311*  --   --  173*  --  395* 156*  ?BUN 28*  --   --  26*  --  19 15  ?CREATININE 2.17*  --   --  1.77*  --  1.61* 1.52*  ?CALCIUM 9.4  --   --  8.5*  --  7.5* 7.9*  ?MG  --   --  2.5*  --   --  1.7  --   ?PROT 8.1  --   --   --   --   --  5.5*  ?ALBUMIN 4.5  --   --   --   --   --  3.0*  ?AST 48*  --   --   --   --   --  25  ?ALT 56*  --   --   --   --   --  31  ?ALKPHOS 75  --   --   --   --   --  46  ?BILITOT 1.0  --   --   --   --   --  0.4  ?GFRNONAA 22*  --   --  28*  --  31* 34*  ?ANIONGAP 19*  --   --  9  --  6 6  ? < > = values in this interval not displayed.  ?  ?Lipids No results for input(s): CHOL, TRIG, HDL, LABVLDL, LDLCALC, CHOLHDL in the last 168 hours.  ?Hematology ?Recent Labs  ?Lab 01/25/22 ?1327 01/25/22 ?1337 01/25/22 ?2235 01/25/22 ?2242 01/27/22 ?0500  ?WBC 6.3  --  10.9*  --  6.0  ?RBC 4.62  --  4.48  --  3.32*  ?HGB 13.7   < > 13.3 12.9 10.0*  ?HCT 41.2   < > 40.2 38.0 28.6*  ?MCV 89.2  --  89.7  --  86.1  ?MCH 29.7  --  29.7  --  30.1  ?MCHC 33.3  --  33.1  --  35.0  ?RDW 13.5  --  13.6  --  14.2  ?PLT 238  --  191  --  170  ? < > = values in this interval not displayed.  ? ?Thyroid  ?Recent Labs  ?Lab 01/25/22 ?2235  ?TSH 35.069*  ?  ?BNP ?Recent Labs  ?Lab 01/25/22 ?1327  ?BNP 555.9*  ?  ?DDimer No results for input(s): DDIMER in the last 168 hours.  ? ?Radiology  ?  ?DG Chest Port 1 View ? ?Result Date: 01/25/2022 ?CLINICAL DATA:  Hyperglycemia. EXAM: PORTABLE CHEST 1 VIEW COMPARISON:  January 01, 2022. FINDINGS: The heart size and mediastinal contours are within normal limits. Both lungs are clear. The visualized skeletal structures are unremarkable. IMPRESSION: No active disease. Electronically  Signed   By: Marijo Conception M.D.   On: 01/25/2022 13:10  ? ?[place] EKG SITE RITE ? ?Result Date: 01/26/2022 ?If [employer] not attached, placement could not be confirmed due to current cardiac rhythm.  ? ?Cardiac Studies  ? ?N/a  ? ?Patient Profile  ?   ?87 y.o. female with a hx of paroxysmal atrial fibrillation on Eliquis for anticoagulation, diabetes mellitus, hypertension, hyperlipidemia and hypothyroidism who was seen 01/25/2022 for the evaluation of atrial fibrillation with rapid ventricular rate at the request of Dr. Vanita Panda. ? ?Assessment & Plan  ?  ?Atrial fibrillation with rapid ventricular rate/Hx of paroxsymal afib: initially on IV amiodarone, but lost IV access. Transitioned to amiodarone '400mg'$  BID yesterday. Would continue '400mg'$  BID x1 week, then '200mg'$  BID x1 week, then '200mg'$  daily. Blood pressures are still soft at times, with her hypotension on admission will hold on resuming her BB at discharge. ?-- missed dose of Eliquis PTA, but has been resumed, Eliquis 2.'5mg'$  BID ?  ?AKI: Likely due to poor p.o. intake, significant dehydration and poorly controlled blood sugars  ?-- Cr 2.17>>>1.52 today ?  ?Hypokalemia:Potassium 2.4 on admission, now improved 3.9 ?-- BMET ? ?Hypothyroidism: TSH 35 ?-- says she has been complaint with meds? ?-- management per outpatient primary   ?  ?Diabetes: Hgb A1c 12.4 ?-- on SSI ?-- follows with endocrinology as an outpatient, says she has an appt in 2 weeks ? ?Will arrange outpatient follow up in the office.  ? ?For questions or updates,  please contact [place] ?Please consult [email or web address] for contact info under  ? ?  ?   ?Signed, ?Reino Bellis, NP  ?01/27/2022, 10:10 AM   ? ?I have examined the patient and reviewed assessment and plan and discussed with patient.  Agree with above as stated.   ?Converted to NSR.  COntinue Amio.  Other antiHTN meds on hold given her hypotension on presentation.   ? ?Stressed importance of compliance with meds.  Pill box may be  helpful. COntinue RF management.  ? ?Larae Grooms  ?

## 2022-01-27 NOTE — Progress Notes (Signed)
This nurse educated patient on procedure. HOB less than 45*. Held breath upon line removal. Pressure held for 5+min, no s/sx of bleeding noted, pressure drsg applied and instructed to keep CDI for 24 hrs. Instructed to remain in bed for 30 min. Pt VU. Notified nurse of line removal. Fran Lowes, RN VAST ?

## 2022-01-27 NOTE — Progress Notes (Signed)
VAT consult placed to assess PICC line. Consult mentioned that patient reports pain and swelling in right hand. Assessed patient and noted swelling and tenderness in both upper extremities. Some redness in lower right arm that resembles a bruise. Patient reports tenderness at PICC insertion site. Line flushes and draws back blood. If there is concern for a clot, PICC can remain in place as tip terminates in heart; recommendation would be to treat clot vs removal of line. Swelling is noted in both arms/hands however and patient states this has happened before.  ? ?Findings given to RN. Instructed to contact VAT if further concerns arise.  ? ?Ottis Sarnowski Lorita Officer, RN ? ?

## 2022-02-01 ENCOUNTER — Telehealth: Payer: Self-pay | Admitting: Internal Medicine

## 2022-02-01 NOTE — Telephone Encounter (Signed)
New Message: ? ? ? ?Please call, she needs to go over patient's medication. ?

## 2022-02-01 NOTE — Telephone Encounter (Signed)
Spoke with Butch Penny from [facility] to review patient's current medication list. Verified with Butch Penny the medications patient is no longer taking: metoprolol succinate 50 mg, olmesartan 20 mg, triamterene-CHTZ 3.5-25, and repatha sureclick 586 mg/mL. ?

## 2022-02-02 DIAGNOSIS — R350 Frequency of micturition: Secondary | ICD-10-CM | POA: Diagnosis not present

## 2022-02-02 DIAGNOSIS — E118 Type 2 diabetes mellitus with unspecified complications: Secondary | ICD-10-CM | POA: Diagnosis not present

## 2022-02-02 DIAGNOSIS — I480 Paroxysmal atrial fibrillation: Secondary | ICD-10-CM | POA: Diagnosis not present

## 2022-02-02 DIAGNOSIS — I10 Essential (primary) hypertension: Secondary | ICD-10-CM | POA: Diagnosis not present

## 2022-02-03 ENCOUNTER — Encounter: Payer: Self-pay | Admitting: Rehabilitative and Restorative Service Providers"

## 2022-02-03 ENCOUNTER — Ambulatory Visit (INDEPENDENT_AMBULATORY_CARE_PROVIDER_SITE_OTHER): Payer: Medicare PPO | Admitting: Rehabilitative and Restorative Service Providers"

## 2022-02-03 ENCOUNTER — Other Ambulatory Visit: Payer: Self-pay

## 2022-02-03 DIAGNOSIS — E1065 Type 1 diabetes mellitus with hyperglycemia: Secondary | ICD-10-CM | POA: Diagnosis not present

## 2022-02-03 DIAGNOSIS — M62838 Other muscle spasm: Secondary | ICD-10-CM

## 2022-02-03 DIAGNOSIS — M79601 Pain in right arm: Secondary | ICD-10-CM

## 2022-02-03 DIAGNOSIS — M6281 Muscle weakness (generalized): Secondary | ICD-10-CM | POA: Diagnosis not present

## 2022-02-03 DIAGNOSIS — R293 Abnormal posture: Secondary | ICD-10-CM | POA: Diagnosis not present

## 2022-02-03 NOTE — Therapy (Addendum)
?OUTPATIENT PHYSICAL THERAPY TREATMENT NOTE/ DISCHARGE SUMMARY ? ? ?Patient Name: [name] ?MRN: [id number] ?DOB:1934-10-24, 87 y.o., female ?Today's Date: 02/03/2022 ? ?PCP: Deland Pretty, MD ?REFERRING PROVIDER: Gregor Hams, MD ? ?END OF SESSION:  ? PT End of Session - 02/03/22 1347   ? ? Visit Number 2   ? Number of Visits 12   ? Date for PT Re-Evaluation 02/04/22   ? Authorization Type [employer] $20 copay   ? Authorization Time Period until 03/02/2022   ? Authorization - Visit Number 2   ? Authorization - Number of Visits 12   ? PT Start Time 1345   ? PT Stop Time 1423   ? PT Time Calculation (min) 38 min   ? Activity Tolerance Patient tolerated treatment well   ? Behavior During Therapy [facility] for tasks assessed/performed   ? ?  ?  ? ?  ? ? ?Past Medical History:  ?Diagnosis Date  ? Colon polyp   ? Diabetes mellitus   ? Hyperlipidemia   ? Hypertension   ? Poor appetite   ? Thyroid disease   ? ?Past Surgical History:  ?Procedure Laterality Date  ? BREAST EXCISIONAL BIOPSY Right   ? CARDIOVASCULAR STRESS TEST  07/15/2006  ? Normal scan, no ECG changes  ? TRANSTHORACIC ECHOCARDIOGRAM  01/08/2011  ? EF 60-65%, moderae mitral regurg, LA mild-moderately dilated,  ? ?Patient Active Problem List  ? Diagnosis Date Noted  ? Stage 3b chronic kidney disease (CKD) ([place]) 01/27/2022  ? Atrial fibrillation with rapid ventricular response ([place]) 01/25/2022  ? Elevated transaminase level 01/25/2022  ? Hypokalemia 09/02/2021  ? Acute renal failure superimposed on stage 3b chronic kidney disease ([place]) 09/01/2021  ? COVID-19 virus infection 03/13/2021  ? DKA (diabetic ketoacidosis) ([place]) 03/13/2021  ? Hyperglycemia 03/23/2020  ? LADA (latent autoimmune diabetes in adults), managed as type 1 ([place]) 10/26/2018  ? Hypercholesterolemia 03/15/2018  ? Left hip pain 06/07/2017  ? Hypothyroidism 02/03/2015  ? Essential hypertension 07/10/2013  ? Type 2 diabetes mellitus ([place]) 07/10/2013  ? Upper respiratory infection 07/10/2013  ? PAF (paroxysmal  atrial fibrillation) ([place]) 12/26/2012  ? Postop check 04/13/2011  ? ? ?REFERRING PROVIDER: Gregor Hams, MD ?  ?REFERRING DIAG: R07.89 (ICD-10-CM) - Other chest pain ? ?THERAPY DIAG:  ?Pain in right arm ? ?Other muscle spasm ? ?Muscle weakness (generalized) ? ?Abnormal posture ? ?PERTINENT HISTORY: Hyperlipidemia, DM, thryoid disease, HTN ? ?PRECAUTIONS: None ? ?SUBJECTIVE: Pt indicated complaints in Rt shoulder mostly today upon arrival, not so much in chest.  Pt indicated she went to hospital a few times since last visit.  ? ?PAIN:  ?Are you having pain? Yes: NPRS scale: 5/10, pain at worst 8/10 ?Pain location: Rt posterior shoulder ?Pain description: achy, sore ?Aggravating factors: getting dressed, lifting arm ?Relieving factors: heat pad ? ? ?OBJECTIVE:  ?  ?DIAGNOSTIC FINDINGS:  ?12/24/2021: xrays negative for fractures ?  ?PATIENT SURVEYS:  ?12/24/2021: FOTO intake 49, predicted 61 ?  ?COGNITION: ?          12/24/2021: Overall cognitive status: Within functional limits for tasks assessed ?                                 ?SENSATION: ?12/24/2021: WFL ?  ?POSTURE: ?12/24/2021: Rounded shoulder, mild increased thoracic kyphosis c forward head posture.  ?  ?THORACIC ROM: ?12/24/2021:  unrestricted ?  ?UPPER EXTREMITY ROM:  ?  ?Active ROM Right ?12/24/2021  Left ?12/24/2021 Right ?02/03/2022  ?Shoulder flexion AROM against gravity 125 degrees ?  ?Pain noted throughout range, increased c increased amount     ?Shoulder extension       ?Shoulder abduction       ?Shoulder adduction       ?Shoulder internal rotation WFL   64 in 90 deg abduction supine AROM  ?Shoulder external rotation WFL   85 in 90 deg abduction supine AROM  ?Elbow flexion       ?Elbow extension       ?Wrist flexion       ?Wrist extension       ?Wrist ulnar deviation       ?Wrist radial deviation       ?Wrist pronation       ?Wrist supination       ?(Blank rows = not tested) ?  ?UPPER EXTREMITY MMT: ?  ?MMT Right ?12/24/2021 Left ?12/24/2021 Right 02/03/2022   ?Shoulder flexion 5/5 5/5 5/5  ?Shoulder extension       ?Shoulder abduction 3+/5 c pain produced 5/5 4/5  ?Shoulder adduction       ?Shoulder internal rotation 5/5 5/5 5/5  ?Shoulder external rotation 5/5 5/5 4+/5  ?Middle trapezius       ?Lower trapezius       ?Elbow flexion 5/5 5/5   ?Elbow extension 5/5 5/5   ?Wrist flexion       ?Wrist extension       ?Wrist ulnar deviation       ?Wrist radial deviation       ?Wrist pronation       ?Wrist supination       ?Grip strength (lbs)       ?(Blank rows = not tested) ?  ?PALPATION:  ?12/24/2021: Palpation tenderness along pec major Rt muscle and tendon throughout chest wall into attachment to Rt humerus.  Trigger points and increased tightness noted in palpation.  Tenderness along medial aspect of Rt clavicle, Sternum.  ?            ?TODAY'S TREATMENT:  ?02/03/2022: ?            Therex:           Supine cross arm stretch 15 sec x 5 Rt ?   Supine horizontal abduction green band 2 x 10 ?   Standing green band ER c towel at side 2 x 10 bilateral ?   Standing green bilateral rows x 20 ?   Standing green gh ext x 15  ? ?   Additional time spent in review of HEP c verbal and visual reminders of technique.  ?    ? Manual: Supine Rt shoulder PROM all direction, Ir stretch in 90 deg abduction ? ?12/24/2021: ?            Therex:          HEP instruction/performance c cues for techniques, handout provided.  Trial set performed of each for comprehension and symptom assessment.  See below for exercise list. ?  ?  ?PATIENT EDUCATION: ?12/24/2021: ?Education details: HEP, POC ?Person educated: Patient ?Education method: Explanation, Demonstration, Verbal cues, and Handouts ?Education comprehension: verbalized understanding and returned demonstration ?  ?  ?HOME EXERCISE PROGRAM: ?02/03/2022: ?Access Code: [email or web address] ?URL: [email or web address] ?Date: 02/03/2022 ?Prepared by: Scot Jun ? ?Exercises ?- Shoulder External Rotation and Scapular Retraction with Resistance  -  2-3 x daily - 7 x weekly - 1 sets - 10 reps - 3-5 hold ?-  Corner Pec Major Stretch  - 2-3 x daily - 7 x weekly - 1 sets - 5 reps - 15 hold ?- Standing Shoulder Posterior Capsule Stretch (Mirrored)  - 2 x daily - 7 x weekly - 1 sets - 5 reps - 15-30 hold ?- Shoulder External Rotation with Anchored Resistance (Mirrored)  - 2 x daily - 7 x weekly - 3 sets - 10 reps ?  ?ASSESSMENT: ?  ?CLINICAL IMPRESSION: ?Return today after > 30 days inactivity due to several visits to ED.  Improvement reported for chest complaints with chief complaint today related to Rt posterior shoulder.  IR mobility restriction and strength deficits in ER noted today in recheck.  Continued skilled PT services indicated at this time. Detailed importance of attendance going forward pending any other medical issues.  ?  ?  ?OBJECTIVE IMPAIRMENTS decreased activity tolerance, decreased endurance, decreased mobility, decreased ROM, decreased strength, impaired perceived functional ability, increased muscle spasms, impaired UE functional use, improper body mechanics, postural dysfunction, and pain.  ?  ?ACTIVITY LIMITATIONS cleaning, driving, meal prep, occupation, and laundry.  ?  ?PERSONAL FACTORS Hyperlipidemia, DM, thryoid disease, HTN are also affecting patient's functional outcome.  ?  ?  ?REHAB POTENTIAL: Good ?  ?CLINICAL DECISION MAKING: Stable/uncomplicated ?  ?EVALUATION COMPLEXITY: Low ?  ?  ?GOALS: ?Goals reviewed with patient? Yes ?  ?Short term PT Goals (target date for Short term goals are 3 weeks 01/14/2022) ?Patient will demonstrate independent use of home exercise program to maintain progress from in clinic treatments. ?Goal status: partially met - 02/03/2022 ?  ?Long term PT goals (target dates for all long term goals are 6 weeks  02/04/2022 ) ?Patient will demonstrate/report pain at worst less than or equal to 2/10 to facilitate minimal limitation in daily activity secondary to pain symptoms. ?Goal status:  on going- 02/03/2022 ?   ?Patient will demonstrate independent use of home exercise program to facilitate ability to maintain/progress functional gains from skilled physical therapy services. ?Goal status: on going- 02/03/2022 ?  ?Patie

## 2022-02-05 ENCOUNTER — Other Ambulatory Visit: Payer: Self-pay

## 2022-02-05 ENCOUNTER — Encounter (HOSPITAL_COMMUNITY): Payer: Self-pay

## 2022-02-05 ENCOUNTER — Inpatient Hospital Stay (HOSPITAL_COMMUNITY)
Admission: EM | Admit: 2022-02-05 | Discharge: 2022-02-08 | DRG: 378 | Disposition: A | Discharge status: Home Health | Payer: Medicare PPO | Attending: Internal Medicine | Admitting: Internal Medicine

## 2022-02-05 DIAGNOSIS — E11649 Type 2 diabetes mellitus with hypoglycemia without coma: Secondary | ICD-10-CM | POA: Diagnosis present

## 2022-02-05 DIAGNOSIS — E876 Hypokalemia: Secondary | ICD-10-CM | POA: Diagnosis not present

## 2022-02-05 DIAGNOSIS — I129 Hypertensive chronic kidney disease with stage 1 through stage 4 chronic kidney disease, or unspecified chronic kidney disease: Secondary | ICD-10-CM | POA: Diagnosis not present

## 2022-02-05 DIAGNOSIS — K626 Ulcer of anus and rectum: Secondary | ICD-10-CM | POA: Diagnosis not present

## 2022-02-05 DIAGNOSIS — I480 Paroxysmal atrial fibrillation: Secondary | ICD-10-CM | POA: Diagnosis present

## 2022-02-05 DIAGNOSIS — Z79899 Other long term (current) drug therapy: Secondary | ICD-10-CM

## 2022-02-05 DIAGNOSIS — Z79890 Hormone replacement therapy (postmenopausal): Secondary | ICD-10-CM

## 2022-02-05 DIAGNOSIS — R195 Other fecal abnormalities: Secondary | ICD-10-CM

## 2022-02-05 DIAGNOSIS — N1832 Chronic kidney disease, stage 3b: Secondary | ICD-10-CM | POA: Diagnosis present

## 2022-02-05 DIAGNOSIS — Z87891 Personal history of nicotine dependence: Secondary | ICD-10-CM | POA: Diagnosis not present

## 2022-02-05 DIAGNOSIS — D649 Anemia, unspecified: Secondary | ICD-10-CM | POA: Diagnosis present

## 2022-02-05 DIAGNOSIS — Z7901 Long term (current) use of anticoagulants: Secondary | ICD-10-CM

## 2022-02-05 DIAGNOSIS — Z794 Long term (current) use of insulin: Secondary | ICD-10-CM | POA: Diagnosis not present

## 2022-02-05 DIAGNOSIS — E119 Type 2 diabetes mellitus without complications: Secondary | ICD-10-CM

## 2022-02-05 DIAGNOSIS — K922 Gastrointestinal hemorrhage, unspecified: Principal | ICD-10-CM

## 2022-02-05 DIAGNOSIS — Z833 Family history of diabetes mellitus: Secondary | ICD-10-CM | POA: Diagnosis not present

## 2022-02-05 DIAGNOSIS — Z86010 Personal history of colonic polyps: Secondary | ICD-10-CM

## 2022-02-05 DIAGNOSIS — D62 Acute posthemorrhagic anemia: Secondary | ICD-10-CM | POA: Diagnosis not present

## 2022-02-05 DIAGNOSIS — Z8249 Family history of ischemic heart disease and other diseases of the circulatory system: Secondary | ICD-10-CM | POA: Diagnosis not present

## 2022-02-05 DIAGNOSIS — K449 Diaphragmatic hernia without obstruction or gangrene: Secondary | ICD-10-CM | POA: Diagnosis not present

## 2022-02-05 DIAGNOSIS — E1122 Type 2 diabetes mellitus with diabetic chronic kidney disease: Secondary | ICD-10-CM | POA: Diagnosis not present

## 2022-02-05 DIAGNOSIS — I10 Essential (primary) hypertension: Secondary | ICD-10-CM | POA: Diagnosis not present

## 2022-02-05 DIAGNOSIS — E7800 Pure hypercholesterolemia, unspecified: Secondary | ICD-10-CM | POA: Diagnosis present

## 2022-02-05 DIAGNOSIS — N179 Acute kidney failure, unspecified: Secondary | ICD-10-CM | POA: Diagnosis not present

## 2022-02-05 DIAGNOSIS — Q438 Other specified congenital malformations of intestine: Secondary | ICD-10-CM

## 2022-02-05 DIAGNOSIS — E1165 Type 2 diabetes mellitus with hyperglycemia: Secondary | ICD-10-CM | POA: Diagnosis not present

## 2022-02-05 DIAGNOSIS — E039 Hypothyroidism, unspecified: Secondary | ICD-10-CM | POA: Diagnosis not present

## 2022-02-05 DIAGNOSIS — K5730 Diverticulosis of large intestine without perforation or abscess without bleeding: Secondary | ICD-10-CM | POA: Diagnosis not present

## 2022-02-05 DIAGNOSIS — K6289 Other specified diseases of anus and rectum: Secondary | ICD-10-CM | POA: Diagnosis not present

## 2022-02-05 DIAGNOSIS — K921 Melena: Principal | ICD-10-CM | POA: Diagnosis present

## 2022-02-05 DIAGNOSIS — I959 Hypotension, unspecified: Secondary | ICD-10-CM | POA: Diagnosis not present

## 2022-02-05 LAB — CBC WITH DIFFERENTIAL/PLATELET
Abs Immature Granulocytes: 0.02 10*3/uL (ref 0.00–0.07)
Basophils Absolute: 0 10*3/uL (ref 0.0–0.1)
Basophils Relative: 1 %
Eosinophils Absolute: 0 10*3/uL (ref 0.0–0.5)
Eosinophils Relative: 1 %
HCT: 28.2 % — ABNORMAL LOW (ref 36.0–46.0)
Hemoglobin: 9.1 g/dL — ABNORMAL LOW (ref 12.0–15.0)
Immature Granulocytes: 0 %
Lymphocytes Relative: 9 %
Lymphs Abs: 0.6 10*3/uL — ABNORMAL LOW (ref 0.7–4.0)
MCH: 29.6 pg (ref 26.0–34.0)
MCHC: 32.3 g/dL (ref 30.0–36.0)
MCV: 91.9 fL (ref 80.0–100.0)
Monocytes Absolute: 0.5 10*3/uL (ref 0.1–1.0)
Monocytes Relative: 9 %
Neutro Abs: 4.8 10*3/uL (ref 1.7–7.7)
Neutrophils Relative %: 80 %
Platelets: 229 10*3/uL (ref 150–400)
RBC: 3.07 MIL/uL — ABNORMAL LOW (ref 3.87–5.11)
RDW: 14.9 % (ref 11.5–15.5)
WBC: 6 10*3/uL (ref 4.0–10.5)
nRBC: 0 % (ref 0.0–0.2)

## 2022-02-05 LAB — COMPREHENSIVE METABOLIC PANEL
ALT: 35 U/L (ref 0–44)
AST: 35 U/L (ref 15–41)
Albumin: 3.7 g/dL (ref 3.5–5.0)
Alkaline Phosphatase: 53 U/L (ref 38–126)
Anion gap: 8 (ref 5–15)
BUN: 16 mg/dL (ref 8–23)
CO2: 27 mmol/L (ref 22–32)
Calcium: 8.6 mg/dL — ABNORMAL LOW (ref 8.9–10.3)
Chloride: 100 mmol/L (ref 98–111)
Creatinine, Ser: 1.3 mg/dL — ABNORMAL HIGH (ref 0.44–1.00)
GFR, Estimated: 41 mL/min — ABNORMAL LOW (ref >60)
Glucose, Bld: 425 mg/dL — ABNORMAL HIGH (ref 70–99)
Potassium: 3.6 mmol/L (ref 3.5–5.1)
Sodium: 135 mmol/L (ref 135–145)
Total Bilirubin: 0.7 mg/dL (ref 0.3–1.2)
Total Protein: 6.5 g/dL (ref 6.5–8.1)

## 2022-02-05 LAB — BLOOD GAS, VENOUS
Acid-Base Excess: 1.9 mmol/L (ref 0.0–2.0)
Bicarbonate: 26.6 mmol/L (ref 20.0–28.0)
Drawn by: 164
O2 Saturation: 63.9 %
Patient temperature: 37
pCO2, Ven: 41 mmHg — ABNORMAL LOW (ref 44–60)
pH, Ven: 7.42 (ref 7.25–7.43)
pO2, Ven: 39 mmHg (ref 32–45)

## 2022-02-05 LAB — MAGNESIUM: Magnesium: 2 mg/dL (ref 1.7–2.4)

## 2022-02-05 LAB — CBG MONITORING, ED: Glucose-Capillary: 256 mg/dL — ABNORMAL HIGH (ref 70–99)

## 2022-02-05 MED ORDER — INSULIN GLARGINE (2 UNIT DIAL) 300 UNIT/ML ~~LOC~~ SOPN: 12.0000 [IU] | PEN_INJECTOR | Freq: Every day | SUBCUTANEOUS | Status: DC

## 2022-02-05 MED ORDER — ACETAMINOPHEN 650 MG RE SUPP: 650.0000 mg | Freq: Four times a day (QID) | RECTAL | Status: DC | PRN

## 2022-02-05 MED ORDER — INSULIN GLARGINE-YFGN 100 UNIT/ML ~~LOC~~ SOLN
12.0000 [IU] | Freq: Every day | SUBCUTANEOUS | Status: DC
  Administered 2022-02-05: 12 [IU] via SUBCUTANEOUS
  Filled 2022-02-05 (×3): qty 0.12

## 2022-02-05 MED ORDER — FAMOTIDINE IN NACL 20-0.9 MG/50ML-% IV SOLN
20.0000 mg | Freq: Once | INTRAVENOUS | Status: AC
  Administered 2022-02-05: 20 mg via INTRAVENOUS
  Filled 2022-02-05: qty 50

## 2022-02-05 MED ORDER — TRAZODONE HCL 50 MG PO TABS
25.0000 mg | ORAL_TABLET | Freq: Every evening | ORAL | Status: DC | PRN
  Administered 2022-02-06 – 2022-02-07 (×2): 25 mg via ORAL
  Filled 2022-02-05 (×2): qty 1

## 2022-02-05 MED ORDER — ACETAMINOPHEN 325 MG PO TABS: 650.0000 mg | ORAL_TABLET | Freq: Four times a day (QID) | ORAL | Status: DC | PRN

## 2022-02-05 MED ORDER — ATORVASTATIN CALCIUM 10 MG PO TABS
20.0000 mg | ORAL_TABLET | Freq: Every day | ORAL | Status: DC
  Administered 2022-02-06 – 2022-02-08 (×3): 20 mg via ORAL
  Filled 2022-02-05 (×3): qty 2

## 2022-02-05 MED ORDER — INSULIN ASPART 100 UNIT/ML IJ SOLN
8.0000 [IU] | Freq: Once | INTRAMUSCULAR | Status: AC
  Administered 2022-02-05: 8 [IU] via SUBCUTANEOUS

## 2022-02-05 MED ORDER — SENNA 8.6 MG PO TABS
1.0000 | ORAL_TABLET | Freq: Two times a day (BID) | ORAL | Status: DC
  Administered 2022-02-05 – 2022-02-08 (×6): 8.6 mg via ORAL
  Filled 2022-02-05 (×5): qty 1

## 2022-02-05 MED ORDER — DILTIAZEM HCL ER COATED BEADS 180 MG PO CP24
180.0000 mg | ORAL_CAPSULE | Freq: Every day | ORAL | Status: DC
  Administered 2022-02-06 – 2022-02-07 (×2): 180 mg via ORAL
  Filled 2022-02-05 (×3): qty 1

## 2022-02-05 MED ORDER — PANTOPRAZOLE INFUSION (NEW) - SIMPLE MED
8.0000 mg/h | INTRAVENOUS | Status: DC
  Administered 2022-02-06 – 2022-02-07 (×3): 8 mg/h via INTRAVENOUS
  Filled 2022-02-05: qty 100
  Filled 2022-02-05: qty 80
  Filled 2022-02-05 (×3): qty 100
  Filled 2022-02-05: qty 80

## 2022-02-05 MED ORDER — SODIUM CHLORIDE 0.9 % IV BOLUS
500.0000 mL | Freq: Once | INTRAVENOUS | Status: AC
  Administered 2022-02-05: 500 mL via INTRAVENOUS

## 2022-02-05 MED ORDER — INSULIN ASPART 100 UNIT/ML IJ SOLN
0.0000 [IU] | INTRAMUSCULAR | Status: DC
  Administered 2022-02-05: 8 [IU] via SUBCUTANEOUS

## 2022-02-05 MED ORDER — PANTOPRAZOLE 80MG IVPB - SIMPLE MED
80.0000 mg | Freq: Once | INTRAVENOUS | Status: AC
  Administered 2022-02-05: 80 mg via INTRAVENOUS
  Filled 2022-02-05: qty 80

## 2022-02-05 MED ORDER — SODIUM CHLORIDE 0.9 % IV SOLN: INTRAVENOUS | Status: DC

## 2022-02-05 MED ORDER — AMIODARONE HCL 200 MG PO TABS
200.0000 mg | ORAL_TABLET | Freq: Every day | ORAL | Status: DC
  Administered 2022-02-06 – 2022-02-08 (×3): 200 mg via ORAL
  Filled 2022-02-05 (×3): qty 1

## 2022-02-05 MED ORDER — LEVOTHYROXINE SODIUM 75 MCG PO TABS
75.0000 ug | ORAL_TABLET | Freq: Every day | ORAL | Status: DC
  Administered 2022-02-06: 75 ug via ORAL
  Filled 2022-02-05: qty 1

## 2022-02-05 MED ORDER — PANTOPRAZOLE SODIUM 40 MG IV SOLR: 40.0000 mg | Freq: Two times a day (BID) | INTRAVENOUS | Status: DC

## 2022-02-05 NOTE — ED Provider Triage Note (Signed)
Emergency Medicine Provider Triage Evaluation Note ? ?[name] , a 87 y.o. female  was evaluated in triage.  Pt complains of concern for GI bleed.  Patient was recently hospitalized and discharged on April 17 for an AKI, hypotension, and hypokalemia.  Patient is overall not had any changes symptoms since she was discharged.  She had a follow-up appointment with her doctor's office and there is noted that she had a hemoglobin drop from 13 to 9.0.  ?She has not had any melanotic stools or blood in her stool.  No hematochezia.  No other signs of bleeding.  She did have a positive Hemoccult in the doctor's office so they sent her here for presumed GI bleed ? ?Review of Systems  ?Positive:  ?Negative:  ? ?Physical Exam  ?BP (!) 146/55 (BP Location: Right Arm)   Pulse 76   Temp 98.5 ?F (36.9 ?C) (Oral)   Resp 16   SpO2 100%  ?Gen:   Awake, no distress   ?Resp:  Normal effort  ?MSK:   Moves extremities without difficulty  ?Other:   ? ?Medical Decision Making  ?Medically screening exam initiated at 2:44 PM.  Appropriate orders placed.  Bernie Fobes was informed that the remainder of the evaluation will be completed by another provider, this initial triage assessment does not replace that evaluation, and the importance of remaining in the ED until their evaluation is complete. ? ? ?  ?Adolphus Birchwood, PA-C ?02/05/22 1445 ? ?

## 2022-02-05 NOTE — Assessment & Plan Note (Addendum)
On amiodarone, apixaban and diltiazem prior to admission.  Apixaban on hold due to concern for GI bleed.  Apixaban can be resumed in 3 days. ?

## 2022-02-05 NOTE — Assessment & Plan Note (Addendum)
-   Continue home medications 

## 2022-02-05 NOTE — Assessment & Plan Note (Addendum)
There was concern for GI loss.  Stool was noted to be heme positive.  No overt bleeding has been noted however.  ?Hemoglobin was noted to be 12.9 on April 17.  Came in with a hemoglobin of 9.1. ?Anemia panel showed ferritin of 244, TIBC 248, iron 53.  B12 level 983.  Folate 18.9.   ?Patient was seen by gastroenterology.  Underwent EGD and colonoscopy.  No active bleeding was noted.  Some diverticulosis was present.  Solitary ulcer was noted in the rectum. ?Hemoglobin stable for the last several days.  Okay for discharge today.  Follow-up with PCP.  Labs to be checked periodically.  Consider referral to hematology if patient remains persistently anemic. ?

## 2022-02-05 NOTE — ED Triage Notes (Signed)
Pt arrived POV from the drs office c/o a positive occult test and a drop in her hgb. Pt went from 13.5 to 8. Denies any dark stools or bleeding that she is aware of. Pt denies any pain.  ?

## 2022-02-05 NOTE — Assessment & Plan Note (Addendum)
Denies any noncompliance with her levothyroxine.  TSH noted to be significantly elevated at 22.  TSH was elevated at 35 on April 17.  No dose adjustments were made at that time.   ?Dose of levothyroxine was increased to 88 mcg daily.  Will need to recheck her TSH in a few weeks.   ?

## 2022-02-05 NOTE — Assessment & Plan Note (Addendum)
Hypokalemia ? ?Renal function stable.  Potassium has been repleted. ?

## 2022-02-05 NOTE — ED Provider Notes (Signed)
?[facility] ?Provider Note ? ? ?CSN: [id number] ?Arrival date & time: 02/05/22  1334 ? ?  ? ?History ? ?No chief complaint on file. ? ? ?[name] is a 87 y.o. female with a past medical history significant for hypertension, paroxysmal A-fib, type 2 diabetes, chronic kidney disease presents with concern for acute drop in hemoglobin over the last 2 weeks.  She was recently discharged from the hospital with primary admission diagnoses of A-fib, hypokalemia, hypertension, CKD, and syncope at the time, was discharged just a couple weeks ago.  She denies noticing dark, tarry, sticky stools, bright red blood per rectum.  She denies any previous history of bleeding ulcers or other internal hemorrhage.  She has not noticed any nosebleeds, heavy vaginal bleeding, or other sources of bleeding.  She reports that she has been taking her Eliquis as prescribed, did not take it today yet.  Patient reports that she took only half of her normal insulin this morning because she knew that she was going to her doctor.  Patient does endorse some fatigue but denies any abdominal pain, nausea, vomiting, or any other symptoms at this time. ? ?HPI ? ?  ? ?Home Medications ?Prior to Admission medications   ?Medication Sig Start Date End Date Taking? Authorizing Provider  ?amiodarone (PACERONE) 200 MG tablet Take amiodarone 400 mg (two tabs) twice daily for 1 week then take 200 mg (1 tab) twice daily for 1 week then take 200 mg (1 tab) once daily from then on 01/27/22   Danford, Suann Larry, MD  ?apixaban (ELIQUIS) 2.5 MG TABS tablet TAKE 1 TABLET BY MOUTH TWICE A DAY ?Patient taking differently: Take 2.5 mg by mouth 2 (two) times daily. 01/04/22   Hilty, Nadean Corwin, MD  ?atorvastatin (LIPITOR) 20 MG tablet Take 20 mg by mouth daily. 02/01/20   [provider]  ?insulin aspart (NOVOLOG FLEXPEN) 100 UNIT/ML FlexPen Inject 8 Units into the skin 3 (three) times daily with meals. 09/04/21   Mariel Aloe, MD  ?insulin glargine, 2 Unit Dial, (TOUJEO MAX SOLOSTAR) 300 UNIT/ML Solostar Pen Inject 12 Units into the skin daily. 09/04/21   Mariel Aloe, MD  ?levothyroxine (SYNTHROID) 75 MCG tablet Take 75 mcg by mouth daily before breakfast. 03/09/21   [provider]  ?TIADYLT ER 180 MG 24 hr capsule TAKE 1 CAPSULE BY MOUTH EVERY DAY ?Patient taking differently: Take 180 mg by mouth daily. 11/27/21   Hilty, Nadean Corwin, MD  ?   ? ?Allergies    ?Patient has no known allergies.   ? ?Review of Systems   ?Review of Systems  ?Constitutional:  Positive for fatigue.  ?All other systems reviewed and are negative. ? ?Physical Exam ?Updated Vital Signs ?BP (!) 146/55 (BP Location: Right Arm)   Pulse 76   Temp 98.5 ?F (36.9 ?C) (Oral)   Resp 16   Ht '5\' 2"'$  (1.575 m)   Wt 65.3 kg   SpO2 100%   BMI 26.34 kg/m?  ?Physical Exam ?Vitals and nursing note reviewed.  ?Constitutional:   ?   General: She is not in acute distress. ?   Appearance: Normal appearance.  ?HENT:  ?   Head: Normocephalic and atraumatic.  ?Eyes:  ?   General:     ?   Right eye: No discharge.     ?   Left eye: No discharge.  ?Cardiovascular:  ?   Rate and Rhythm: Normal rate and regular rhythm.  ?  Heart sounds: No murmur heard. ?  No friction rub. No gallop.  ?Pulmonary:  ?   Effort: Pulmonary effort is normal.  ?   Breath sounds: Normal breath sounds.  ?Abdominal:  ?   General: Bowel sounds are normal.  ?   Palpations: Abdomen is soft.  ?   Comments: No significant tenderness to palpation, no rebound, rigidity, guarding throughout  ?Skin: ?   General: Skin is warm and dry.  ?   Capillary Refill: Capillary refill takes less than 2 seconds.  ?Neurological:  ?   Mental Status: She is alert and oriented to person, place, and time.  ?Psychiatric:     ?   Mood and Affect: Mood normal.     ?   Behavior: Behavior normal.  ? ? ?ED Results / Procedures / Treatments   ?Labs ?(all labs ordered are listed, but only abnormal results are displayed) ?Labs  Reviewed  ?COMPREHENSIVE METABOLIC PANEL - Abnormal; Notable for the following components:  ?    Result Value  ? Glucose, Bld 425 (*)   ? Creatinine, Ser 1.30 (*)   ? Calcium 8.6 (*)   ? GFR, Estimated 41 (*)   ? All other components within normal limits  ?CBC WITH DIFFERENTIAL/PLATELET - Abnormal; Notable for the following components:  ? RBC 3.07 (*)   ? Hemoglobin 9.1 (*)   ? HCT 28.2 (*)   ? Lymphs Abs 0.6 (*)   ? All other components within normal limits  ?BLOOD GAS, VENOUS  ? ? ?EKG ?None ? ?Radiology ?No results found. ? ?Procedures ?Procedures  ? ? ?Medications Ordered in ED ?Medications  ?famotidine (PEPCID) IVPB 20 mg premix (has no administration in time range)  ? ? ?ED Course/ Medical Decision Making/ A&P ?Clinical Course as of 02/05/22 1629  ?Fri Feb 05, 2022  ?1523 13.3 11 days ago to 10.0 9 days ago to 9.1 today. [CP]  ?  ?Clinical Course User Index ?[CP] Amara Manalang H, PA-C  ? ?                        ?Medical Decision Making ?Amount and/or Complexity of Data Reviewed ?Labs: ordered. ? ?Risk ?Prescription drug management. ? ? ?This patient presents to the ED for concern of significant anemia from baseline, concern for active GI bleed, this involves an extensive number of treatment options, and is a complaint that carries with it a high risk of complications and morbidity. The emergent differential diagnosis prior to evaluation includes, but is not limited to, active GI bleed, GI hemorrhage, versus other source of blood loss.  ? ?This is not an exhaustive differential.  ? ?Past Medical History / Co-morbidities / Social History: ? hypertension, paroxysmal A-fib, type 2 diabetes, chronic kidney disease ? ?Additional history: ?Chart reviewed. Pertinent results include: Reviewed visit from PCP today that noted hemoglobin dropped to 8.8, positive Hemoccult.  Reviewed imaging, lab work from recent hospital admission. ? ?Physical Exam: ?Physical exam performed. The pertinent findings include: Did not  repeat Hemoccult because it was already performed earlier today.  Patient is overall well-appearing, no tenderness palpation of the abdomen, not in acute distress.  She has some pallor to mucous membranes. ? ?Lab Tests: ?I ordered, and personally interpreted labs.  The pertinent results include: CBC with hemoglobin of 9.1, down from baseline of 13.311 days ago.  Had dropped to 10.09 days ago.  Patient's blood sugar elevated at 425, she also has elevated creatinine of 1.3 which  is improved from recent baseline.  Patient's blood sugar likely elevated because she only took half of her normal insulin at home this morning because she was going to a doctor's appointment right afterwards.  She denies confusion, nausea, vomiting, abdominal pain.  VBG overall unremarkable, no evidence of acute DKA. ?  ?Cardiac Monitoring:  ?The patient was maintained on a cardiac monitor.  My attending physician Dr. Tomi Bamberger viewed and interpreted the cardiac monitored which showed an underlying rhythm of: NSR, moderate PR and QT prolongation. We will check a magnesium level. ?  ?Medications: ?I ordered medication including Pepcid for GI bleed, NovoLog 8 units for hyperglycemia, 500 mL fluid bolus for hyperglycemia, limited to small bolus secondary to CKD, do not want to make patient fluid overloaded.  ? ?Consultations Obtained: ?I requested consultation with the gastroenterology team,  and discussed lab and imaging findings as well as pertinent plan - they agreed to see this patient.  We will consult with the hospitalist regarding admission, spoke with the [facility] who agrees to admission at this time. ? ?I discussed this case with my attending physician Dr. Tomi Bamberger who cosigned this note including patient's presenting symptoms, physical exam, and planned diagnostics and interventions. Attending physician stated agreement with plan or made changes to plan which were implemented.   ? ?Final Clinical Impression(s) / ED Diagnoses ?Final  diagnoses:  ?None  ? ? ?Rx / DC Orders ?ED Discharge Orders   ? ? None  ? ?  ? ? ?  ?Anselmo Pickler, [place] ?02/05/22 1835 ? ?  ?Dorie Rank, MD ?02/09/22 1729 ? ?

## 2022-02-05 NOTE — H&P (Signed)
?History and Physical  ? ? [name] AQT:[id number] DOB: [birth year] DOA: 02/05/2022 ? ?DOS: the patient was seen and examined on 02/05/2022 ? ?PCP: Deland Pretty, MD  ? ?Patient coming from: Home ? ?I have personally briefly reviewed patient's old medical records in [facility] ? ?HPI - [name] is an 87 y/o with h/o hypertension, paroxysmal A-fib, type 2 diabetes, chronic kidney disease presents with concern for acute drop in hemoglobin over the last 2 weeks.  She was recently discharged from the hospital with primary admission diagnoses of A-fib, hypokalemia, hypertension, CKD, and syncope at the time, was discharged just a couple weeks ago.  She denies noticing dark, tarry, sticky stools, bright red blood per rectum.  She denies any previous history of bleeding ulcers or other internal hemorrhage.  She has not noticed any nosebleeds, heavy vaginal bleeding, or other sources of bleeding.  She reports that she has been taking her Eliquis as prescribed, did not take it today yet.  Patient reports that she took only half of her normal insulin this morning because she knew that she was going to her doctor.  Patient does endorse some fatigue but denies any abdominal pain, nausea, vomiting, or any other symptoms at this time. ? ?ED Course: T 98.5  140/78 HR 72 RR 19. Lab - Hgb 9.1 WBC 6.0. EKG NSR increased PR interval, Left axis deviation, no STEMI, a. Fib resolved from last EKG ? ?Review of Systems:  ?Review of Systems  ?Constitutional: Negative.   ?HENT:  Negative for nosebleeds.   ?Eyes: Negative.   ?Respiratory: Negative.    ?Cardiovascular: Negative.   ?Gastrointestinal:  Positive for blood in stool and constipation. Negative for abdominal pain, diarrhea, heartburn, melena, nausea and vomiting.  ?Genitourinary: Negative.   ?Musculoskeletal: Negative.   ?Skin: Negative.   ?Neurological: Negative.   ?Endo/Heme/Allergies: Negative.   ?Psychiatric/Behavioral: Negative.    ? ?Past Medical History:  ?Diagnosis  Date  ? Colon polyp   ? Diabetes mellitus   ? Hyperlipidemia   ? Hypertension   ? Poor appetite   ? Thyroid disease   ? ? ?Past Surgical History:  ?Procedure Laterality Date  ? BREAST EXCISIONAL BIOPSY Right   ? CARDIOVASCULAR STRESS TEST  07/15/2006  ? Normal scan, no ECG changes  ? TRANSTHORACIC ECHOCARDIOGRAM  01/08/2011  ? EF 60-65%, moderae mitral regurg, LA mild-moderately dilated,  ? ?Soc Hx - widowed x 3 years after 3 years of marriage. Had 3 daughters: two are deceased at 61, 19 one daughter living with two children. Patient lives alone. 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 I-ADLs ? ? reports that she quit smoking about 18 years ago. Her smoking use included cigarettes. She has never used smokeless tobacco. She reports that she does not drink alcohol and does not use drugs. ? ?No Known Allergies ? ?Family History  ?Problem Relation Age of Onset  ? Diabetes Mother   ? Hypertension Father   ? Hyperlipidemia Father   ? Diabetes Sister   ? Hypertension Sister   ? Hyperlipidemia Sister   ? Hypertension Brother   ? Stroke Sister   ? Hypertension Sister   ? Diabetes Sister   ? Cancer Daughter   ? Heart failure Child   ? Breast cancer Neg Hx   ? ? ?Prior to Admission medications   ?Medication Sig Start Date End Date Taking? Authorizing Provider  ?amiodarone (PACERONE) 200 MG tablet Take amiodarone 400 mg (two tabs) twice daily for 1 week then  take 200 mg (1 tab) twice daily for 1 week then take 200 mg (1 tab) once daily from then on 01/27/22   Edwin Dada, MD  ?apixaban (ELIQUIS) 2.5 MG TABS tablet TAKE 1 TABLET BY MOUTH TWICE A DAY ?Patient taking differently: Take 2.5 mg by mouth 2 (two) times daily. 01/04/22   Hilty, Nadean Corwin, MD  ?atorvastatin (LIPITOR) 20 MG tablet Take 20 mg by mouth daily. 02/01/20   [provider]  ?insulin aspart (NOVOLOG FLEXPEN) 100 UNIT/ML FlexPen Inject 8 Units into the skin 3 (three) times daily with meals. 09/04/21   Mariel Aloe, MD  ?insulin glargine, 2  Unit Dial, (TOUJEO MAX SOLOSTAR) 300 UNIT/ML Solostar Pen Inject 12 Units into the skin daily. 09/04/21   Mariel Aloe, MD  ?levothyroxine (SYNTHROID) 75 MCG tablet Take 75 mcg by mouth daily before breakfast. 03/09/21   [provider]  ?TIADYLT ER 180 MG 24 hr capsule TAKE 1 CAPSULE BY MOUTH EVERY DAY ?Patient taking differently: Take 180 mg by mouth daily. 11/27/21   Hilty, Nadean Corwin, MD  ? ? ?Physical Exam: ?Vitals:  ? 02/05/22 1638 02/05/22 1700 02/05/22 1800 02/05/22 1900  ?BP:  (!) 146/65 135/60 140/78  ?Pulse: 74 74 73 72  ?Resp:  '16 16 19  '$ ?Temp:      ?TempSrc:      ?SpO2: 100% 99% 98% 100%  ?Weight:      ?Height:      ? ? ?Physical Exam ?Vitals and nursing note reviewed.  ?Constitutional:   ?   Appearance: Normal appearance. She is normal weight.  ?HENT:  ?   Head: Normocephalic and atraumatic.  ?   Mouth/Throat:  ?   Mouth: Mucous membranes are moist.  ?   Pharynx: Oropharynx is clear.  ?Eyes:  ?   Extraocular Movements: Extraocular movements intact.  ?   Conjunctiva/sclera: Conjunctivae normal.  ?   Pupils: Pupils are equal, round, and reactive to light.  ?Cardiovascular:  ?   Rate and Rhythm: Normal rate and regular rhythm.  ?   Pulses: Normal pulses.  ?   Heart sounds: Normal heart sounds.  ?Pulmonary:  ?   Effort: Pulmonary effort is normal.  ?   Breath sounds: Normal breath sounds.  ?Abdominal:  ?   General: Bowel sounds are normal. There is no distension.  ?   Palpations: Abdomen is soft. There is no mass.  ?   Tenderness: There is no abdominal tenderness. There is no guarding.  ?Musculoskeletal:     ?   General: No swelling or deformity. Normal range of motion.  ?   Cervical back: Normal range of motion and neck supple.  ?Skin: ?   General: Skin is warm and dry.  ?   Coloration: Skin is not jaundiced.  ?Neurological:  ?   General: No focal deficit present.  ?   Mental Status: She is alert and oriented to person, place, and time. Mental status is at baseline.  ?Psychiatric:     ?   Mood  and Affect: Mood normal.     ?   Behavior: Behavior normal.  ?  ? ?Labs on Admission: I have personally reviewed following labs and imaging studies ? ?CBC: ?Recent Labs  ?Lab 02/05/22 ?1449  ?WBC 6.0  ?NEUTROABS 4.8  ?HGB 9.1*  ?HCT 28.2*  ?MCV 91.9  ?PLT 229  ? ?Basic Metabolic Panel: ?Recent Labs  ?Lab 02/05/22 ?1449 02/05/22 ?1514  ?NA 135  --   ?K  3.6  --   ?CL 100  --   ?CO2 27  --   ?GLUCOSE 425*  --   ?BUN 16  --   ?CREATININE 1.30*  --   ?CALCIUM 8.6*  --   ?MG  --  2.0  ? ?GFR: ?Estimated Creatinine Clearance: 28.6 mL/min (A) (by C-G formula based on SCr of 1.3 mg/dL (H)). ?Liver Function Tests: ?Recent Labs  ?Lab 02/05/22 ?1449  ?AST 35  ?ALT 35  ?ALKPHOS 53  ?BILITOT 0.7  ?PROT 6.5  ?ALBUMIN 3.7  ? ?No results for input(s): LIPASE, AMYLASE in the last 168 hours. ?No results for input(s): AMMONIA in the last 168 hours. ?Coagulation Profile: ?No results for input(s): INR, PROTIME in the last 168 hours. ?Cardiac Enzymes: ?No results for input(s): CKTOTAL, CKMB, CKMBINDEX, TROPONINI in the last 168 hours. ?BNP (last 3 results) ?No results for input(s): PROBNP in the last 8760 hours. ?HbA1C: ?No results for input(s): HGBA1C in the last 72 hours. ?CBG: ?Recent Labs  ?Lab 02/05/22 ?2107  ?GLUCAP 256*  ? ?Lipid Profile: ?No results for input(s): CHOL, HDL, LDLCALC, TRIG, CHOLHDL, LDLDIRECT in the last 72 hours. ?Thyroid Function Tests: ?No results for input(s): TSH, T4TOTAL, FREET4, T3FREE, THYROIDAB in the last 72 hours. ?Anemia Panel: ?No results for input(s): VITAMINB12, FOLATE, FERRITIN, TIBC, IRON, RETICCTPCT in the last 72 hours. ?Urine analysis: ?   ?Component Value Date/Time  ? COLORURINE YELLOW 01/25/2022 1529  ? APPEARANCEUR HAZY (A) 01/25/2022 1529  ? LABSPEC 1.011 01/25/2022 1529  ? PHURINE 6.0 01/25/2022 1529  ? GLUCOSEU 150 (A) 01/25/2022 1529  ? HGBUR MODERATE (A) 01/25/2022 1529  ? [place] NEGATIVE 01/25/2022 1529  ? KETONESUR NEGATIVE 01/25/2022 1529  ? PROTEINUR 30 (A) 01/25/2022 1529  ?  UROBILINOGEN 0.2 03/17/2011 0847  ? NITRITE NEGATIVE 01/25/2022 1529  ? LEUKOCYTESUR LARGE (A) 01/25/2022 1529  ? ? ?Radiological Exams on Admission: I have personally reviewed images ?No results found. ?

## 2022-02-05 NOTE — Assessment & Plan Note (Addendum)
Continue atorvastatin

## 2022-02-06 DIAGNOSIS — I10 Essential (primary) hypertension: Secondary | ICD-10-CM | POA: Diagnosis not present

## 2022-02-06 DIAGNOSIS — R195 Other fecal abnormalities: Secondary | ICD-10-CM

## 2022-02-06 DIAGNOSIS — I480 Paroxysmal atrial fibrillation: Secondary | ICD-10-CM

## 2022-02-06 DIAGNOSIS — Z7901 Long term (current) use of anticoagulants: Secondary | ICD-10-CM

## 2022-02-06 DIAGNOSIS — Z794 Long term (current) use of insulin: Secondary | ICD-10-CM

## 2022-02-06 DIAGNOSIS — E039 Hypothyroidism, unspecified: Secondary | ICD-10-CM | POA: Diagnosis not present

## 2022-02-06 DIAGNOSIS — D649 Anemia, unspecified: Secondary | ICD-10-CM | POA: Diagnosis not present

## 2022-02-06 DIAGNOSIS — E11649 Type 2 diabetes mellitus with hypoglycemia without coma: Secondary | ICD-10-CM

## 2022-02-06 LAB — HEMOGLOBIN
Hemoglobin: 9.3 g/dL — ABNORMAL LOW (ref 12.0–15.0)
Hemoglobin: 9.5 g/dL — ABNORMAL LOW (ref 12.0–15.0)

## 2022-02-06 LAB — HEMATOCRIT
HCT: 28.1 % — ABNORMAL LOW (ref 36.0–46.0)
HCT: 28.9 % — ABNORMAL LOW (ref 36.0–46.0)

## 2022-02-06 LAB — GLUCOSE, CAPILLARY
Glucose-Capillary: 105 mg/dL — ABNORMAL HIGH (ref 70–99)
Glucose-Capillary: 106 mg/dL — ABNORMAL HIGH (ref 70–99)
Glucose-Capillary: 159 mg/dL — ABNORMAL HIGH (ref 70–99)
Glucose-Capillary: 204 mg/dL — ABNORMAL HIGH (ref 70–99)
Glucose-Capillary: 254 mg/dL — ABNORMAL HIGH (ref 70–99)
Glucose-Capillary: 357 mg/dL — ABNORMAL HIGH (ref 70–99)
Glucose-Capillary: 39 mg/dL — CL (ref 70–99)
Glucose-Capillary: 42 mg/dL — CL (ref 70–99)
Glucose-Capillary: 59 mg/dL — ABNORMAL LOW (ref 70–99)
Glucose-Capillary: 78 mg/dL (ref 70–99)

## 2022-02-06 LAB — BASIC METABOLIC PANEL
Anion gap: 8 (ref 5–15)
BUN: 11 mg/dL (ref 8–23)
CO2: 26 mmol/L (ref 22–32)
Calcium: 8.2 mg/dL — ABNORMAL LOW (ref 8.9–10.3)
Chloride: 103 mmol/L (ref 98–111)
Creatinine, Ser: 1.08 mg/dL — ABNORMAL HIGH (ref 0.44–1.00)
GFR, Estimated: 51 mL/min — ABNORMAL LOW (ref >60)
Glucose, Bld: 114 mg/dL — ABNORMAL HIGH (ref 70–99)
Potassium: 2.8 mmol/L — ABNORMAL LOW (ref 3.5–5.1)
Sodium: 137 mmol/L (ref 135–145)

## 2022-02-06 LAB — IRON AND TIBC
Iron: 53 ug/dL (ref 28–170)
Saturation Ratios: 21 % (ref 10.4–31.8)
TIBC: 248 ug/dL — ABNORMAL LOW (ref 250–450)
UIBC: 195 ug/dL

## 2022-02-06 LAB — FERRITIN: Ferritin: 244 ng/mL (ref 11–307)

## 2022-02-06 LAB — TSH: TSH: 22.015 u[IU]/mL — ABNORMAL HIGH (ref 0.350–4.500)

## 2022-02-06 MED ORDER — POTASSIUM CHLORIDE 10 MEQ/100ML IV SOLN
10.0000 meq | INTRAVENOUS | Status: AC
  Administered 2022-02-06 (×4): 10 meq via INTRAVENOUS
  Filled 2022-02-06 (×4): qty 100

## 2022-02-06 MED ORDER — LEVOTHYROXINE SODIUM 88 MCG PO TABS
88.0000 ug | ORAL_TABLET | Freq: Every day | ORAL | Status: DC
  Administered 2022-02-07 – 2022-02-08 (×2): 88 ug via ORAL
  Filled 2022-02-06 (×2): qty 1

## 2022-02-06 MED ORDER — PEG-KCL-NACL-NASULF-NA ASC-C 100 G PO SOLR
1.0000 | Freq: Once | ORAL | Status: DC
Start: 2022-02-06 — End: 2022-02-06

## 2022-02-06 MED ORDER — INSULIN ASPART 100 UNIT/ML IJ SOLN
0.0000 [IU] | Freq: Three times a day (TID) | INTRAMUSCULAR | Status: DC
  Administered 2022-02-06: 15 [IU] via SUBCUTANEOUS
  Administered 2022-02-07: 3 [IU] via SUBCUTANEOUS
  Administered 2022-02-07 – 2022-02-08 (×2): 15 [IU] via SUBCUTANEOUS
  Administered 2022-02-08: 8 [IU] via SUBCUTANEOUS

## 2022-02-06 MED ORDER — PEG-KCL-NACL-NASULF-NA ASC-C 100 G PO SOLR
0.5000 | Freq: Once | ORAL | Status: AC
  Administered 2022-02-06: 100 g via ORAL
  Filled 2022-02-06: qty 1

## 2022-02-06 MED ORDER — DEXTROSE 50 % IV SOLN
12.5000 g | INTRAVENOUS | Status: AC
  Administered 2022-02-06: 12.5 g via INTRAVENOUS
  Filled 2022-02-06: qty 50

## 2022-02-06 MED ORDER — POTASSIUM CHLORIDE CRYS ER 20 MEQ PO TBCR
60.0000 meq | EXTENDED_RELEASE_TABLET | Freq: Once | ORAL | Status: AC
  Administered 2022-02-06: 60 meq via ORAL
  Filled 2022-02-06: qty 3

## 2022-02-06 MED ORDER — DEXTROSE-NACL 5-0.9 % IV SOLN
INTRAVENOUS | Status: DC
  Administered 2022-02-06: 1000 mL via INTRAVENOUS

## 2022-02-06 NOTE — Progress Notes (Signed)
? ?[facility] ?PROGRESS NOTE ? ? [name] GYI:[id number] DOB: 26-Jul-1935 DOA: 02/05/2022  1 ?DOS: the patient was seen and examined on 02/06/2022 ? ?PCP: Deland Pretty, MD ? ?Brief History and Hospital Course:  ?87 y/o with h/o hypertension, paroxysmal A-fib, type 2 diabetes, chronic kidney disease presented with concern for acute drop in hemoglobin over the last 2 weeks.  She was recently discharged from the hospital with primary admission diagnoses of A-fib, hypokalemia, hypertension, CKD, and syncope at the time, was discharged just a couple weeks ago.  Found to have a hemoglobin of 9.1.  Was hospitalized for further management.  Stool for occult blood was noted to be positive.   ? ?Consultants: Gastroenterology ? ?Procedures: None yet ? ? ? ?Subjective: ?Denies any abdominal pain nausea or vomiting.  No chest pain or shortness of breath. ? ? ? ?Assessment/Plan: ? ? ?* Acute anemia ?There is concern for GI loss.  Stool was noted to be heme positive.  No overt bleeding has been noted however.  ?Hemoglobin was noted to be 12.9 on April 17.  Came in with a hemoglobin of 9.1. ?Anemia panel shows ferritin of 244, TIBC 248, iron 53.  We will also check folate and B12 levels in the morning. ?Gastroenterology has been consulted.  Patient placed on PPI. ? ?Type 2 diabetes mellitus ([place]) ?Insulin dependent. Last A1C 01/26/22 12.4%.  On Lantus prior to admission.   ?Hypoglycemic episodes noted.  Will discontinue insulin for now.  Had to be placed on D5 infusion.  Likely due to n.p.o. status.  She was given glargine last night. ? ?Essential hypertension ?Monitor blood pressures.  Continue diltiazem.   ? ?PAF (paroxysmal atrial fibrillation) ([place]) ?On amiodarone, apixaban and diltiazem prior to admission.  Apixaban on hold due to concern for GI bleed.  Monitor on telemetry.   ? ?Hypercholesterolemia ?Continue atorvastatin.   ? ?Hypothyroidism ?Denies any noncompliance with her levothyroxine.  TSH noted to be  significantly elevated at 22.  TSH was elevated at 35 on April 17.  No dose adjustments were made at that time.  We will increase her dose to 88 mcg daily.  We will need to recheck her TSH in a few weeks.   ? ?Stage 3b chronic kidney disease (CKD) ([place]) ?Hypokalemia ? ?Renal function stable.  Potassium noted to be low today and will be repleted. ? ? ?DVT Prophylaxis: SCDs for now ?Code Status: Full code ?Family Communication: Discussed with patient ?Disposition Plan: Hopefully return home when improved.  PT and OT evaluation prior to discharge. ? ?Status is: Inpatient ?Remains inpatient appropriate because: Concern for GI bleed ? ? ? ? ?Medications: Scheduled: ? amiodarone  200 mg Oral Daily  ? atorvastatin  20 mg Oral Daily  ? diltiazem  180 mg Oral Daily  ? levothyroxine  75 mcg Oral Q0600  ? [START ON 02/09/2022] pantoprazole  40 mg Intravenous Q12H  ? senna  1 tablet Oral BID  ? ?Continuous: ? dextrose 5 % and 0.9% NaCl 1,000 mL (02/06/22 0424)  ? pantoprazole 8 mg/hr (02/06/22 1032)  ? ?[id number] **OR** acetaminophen, traZODone ? ?Antibiotics: ?Anti-infectives (From admission, onward)  ? ? None  ? ?  ? ? ?Objective: ? ?Vital Signs ? ?Vitals:  ? 02/05/22 2253 02/06/22 0222 02/06/22 9678 02/06/22 0914  ?BP: (!) 147/68 (!) 125/57 130/60 127/63  ?Pulse: 70 60 61 63  ?Resp: '19 20 20 19  '$ ?Temp: 98.4 ?F (36.9 ?C) 98.1 ?F (36.7 ?C) 97.6 ?F (36.4 ?C) 98.4 ?F (36.9 ?  C)  ?TempSrc: Oral Oral Oral Oral  ?SpO2: 95% 93% 98% 100%  ?Weight: 65.9 kg     ?Height: '5\' 2"'$  (1.575 m)     ? ? ?Intake/Output Summary (Last 24 hours) at 02/06/2022 1040 ?Last data filed at 02/06/2022 [phone] ?Gross per 24 hour  ?Intake 211.88 ml  ?Output 800 ml  ?Net -588.12 ml  ? ?Filed Weights  ? 02/05/22 1444 02/05/22 2253  ?Weight: 65.3 kg 65.9 kg  ? ? ?General appearance: Awake alert.  In no distress ?Resp: Clear to auscultation bilaterally.  Normal effort ?Cardio: S1-S2 is normal regular.  No S3-S4.  No rubs murmurs or bruit ?GI: Abdomen is soft.   Nontender nondistended.  Bowel sounds are present normal.  No masses organomegaly ?Extremities: No edema.  Full range of motion of lower extremities. ?Neurologic: Alert and oriented x3.  No focal neurological deficits.  ? ? ?Lab Results: ? ?Data Reviewed: I have personally reviewed labs and imaging study reports ? ?CBC: ?Recent Labs  ?Lab 02/05/22 ?1449 02/05/22 ?2324 02/06/22 ?0751  ?WBC 6.0  --   --   ?NEUTROABS 4.8  --   --   ?HGB 9.1* 9.3* 9.5*  ?HCT 28.2* 28.1* 28.9*  ?MCV 91.9  --   --   ?PLT 229  --   --   ? ? ?Basic Metabolic Panel: ?Recent Labs  ?Lab 02/05/22 ?1449 02/05/22 ?1514 02/06/22 ?0751  ?NA 135  --  137  ?K 3.6  --  2.8*  ?CL 100  --  103  ?CO2 27  --  26  ?GLUCOSE 425*  --  114*  ?BUN 16  --  11  ?CREATININE 1.30*  --  1.08*  ?CALCIUM 8.6*  --  8.2*  ?MG  --  2.0  --   ? ? ?GFR: ?Estimated Creatinine Clearance: 34.5 mL/min (A) (by C-G formula based on SCr of 1.08 mg/dL (H)). ? ?Liver Function Tests: ?Recent Labs  ?Lab 02/05/22 ?1449  ?AST 35  ?ALT 35  ?ALKPHOS 53  ?BILITOT 0.7  ?PROT 6.5  ?ALBUMIN 3.7  ? ? ? ?CBG: ?Recent Labs  ?Lab 02/06/22 ?7672 02/06/22 ?0403 02/06/22 ?0405 02/06/22 ?0430 02/06/22 ?0947  ?GLUCAP 78 42* 36* 105* 106*  ? ? ? ?Thyroid Function Tests: ?Recent Labs  ?  02/05/22 ?2324  ?TSH 22.015*  ? ? ?Anemia Panel: ?Recent Labs  ?  02/06/22 ?0751  ?FERRITIN 244  ?TIBC 248*  ?IRON 53  ? ? ? ?Radiology Studies: ?No results found. ? ? ? ? LOS: 1 day  ? ?Bonnielee Haff ? ?[facility] ?Pager on [email or web address] ? ?02/06/2022, 10:40 AM ? ? ?

## 2022-02-06 NOTE — Plan of Care (Signed)
Patient arrived from ED alert and oriented, without distress. Daughter at bedside. Patient settled into room, assessment completed. Will continue to monitor as per orders and plan of care. ?

## 2022-02-06 NOTE — Progress Notes (Signed)
Patient blood glucose level at 0000 was 39 and 59. Hypoglycemia protocol initiated, patient glucose level 78. Patient remained awake, alert and oriented during this time. Blood glucose level checked at 0400 36 and 42. Protocol initiated again, on call provider made aware, new orders for IV fluid given. Patient glucose level at 0430 105. Patient remains awake, alert and oriented. Will continue to monitor. ?

## 2022-02-06 NOTE — H&P (View-Only) (Signed)
? ?                                            Consultation Note ? ? ?Referring Provider: [facility] ?PCP: Deland Pretty, MD ?Primary Gastroenterologist: Althia Forts (Dr. Billy Fischer contact 2006) ?Reason for consultation: Anemia , FOBT+  ?Hospital Day: 2 ? ?ASSESSMENT:  ? ?87 yo female with new [place] anemia / FOBT +  on Eliquis.   ?Baseline hgb ~ 13, done over last several days to 9 range.  No overt GI blood loss ? ?Paroxysmal atrial fibrillation, on Eliquis ?Last dose of Eliquis was 4/27  ? ?Remote history of colon polyps ?A small tubular adenoma was removed in 2003. No polyps on surveillance colonoscopy in 2006 ( last colonoscopy) ? ?See PMH for additional medical problems ? ? ?PLAN:  ?Will check iron studies ?Ideally she needs an EGD and colonoscopy to evaluate new anemia and FOBT + . She is in relatively good heath ( still works part time). We discussed the procedures including risks / benefits. She will think about and let us know in a little while. Clear liquids for now in case she decides to proceed.  ? ? ?Attending Physician Note  ? ?I have taken an interval history, reviewed the chart and examined the patient. I performed a substantive portion of this encounter, including complete performance of at least one of the key components, in conjunction with the APP. I agree with the APP's note, impression and recommendations with my edits. My additional impressions and recommendations are as follows.  ? ?87 y.o. female with a new anemia, heme + stool and a personal history of a small adenomatous colon polyp in 2003. Eliquis for afib is on hold. Family members at bedside.  ? ?Trend CBC ?Iron studies   ?Colonoscopy, EGD tomorrow  ?Patient's and family's questions, concerns addressed to their satisfaction ? ?Lucio Edward, MD [facility] ?See AMION, [facility], for our on call provider  ? ? ? ? ?History of Present Illness:  ?[name] is a 87 y.o. female with a past medical history significant for  Paroxysmal atrial  fibrillation on Eliquis, hypertension, HDL, DM 2, hypothyroidism, CKD stage IIIb, remote small adenomatous colon polyp, small hiatal hernia.  See PMH for any additional medical problems. ? ?Patient was hospitalized earlier this month with a syncopal episode , AKI, and atrial fibrillation with RVR, she was discharged on 4/19.  She represented to ED yesterday from PCPs office for evaluation of a drop in her hemoglobin and Hemoccult positive stool.  No overt GI bleeding.  On 01/25/2022 her hemoglobin was 13.8, it did decline during that admission to 10.0.  Yesterday in the ED it was 9.1. ? ?Ms Riemann lives alone, still works part time.  She had mild LUQ discomfort a couple of months ago but the discomfort resolved after taking a laxative.  Other than occasional constipation she has no GI symptoms.  She has not seen any blood in her stool but admits to not always paying attention to that.  Her bowel habits have not changed.  She has no nausea, vomiting nor weight loss.  She does not take NSAIDs.  ? ?Previous GI Evaluation / History   ? ?EGD 2003 (Dr. Lajoyce Corners) ?Hiatal hernia ? ?Colonoscopy in 2003 ( Dr. Lajoyce Corners) ?Small tubular adenoma ? ?Colonoscopy in 2006  ?No polyps ? ?Recent Labs and Imaging ?DG Chest [facility]  1 View ? ?Result Date: 01/25/2022 ?CLINICAL DATA:  Hyperglycemia. EXAM: PORTABLE CHEST 1 VIEW COMPARISON:  January 01, 2022. FINDINGS: The heart size and mediastinal contours are within normal limits. Both lungs are clear. The visualized skeletal structures are unremarkable. IMPRESSION: No active disease. Electronically Signed   By: Marijo Conception M.D.   On: 01/25/2022 13:10  ? ?[place] EKG SITE RITE ? ?Result Date: 01/26/2022 ?If [employer] not attached, placement could not be confirmed due to current cardiac rhythm.  ? ?Labs:  ?Recent Labs  ?  02/05/22 ?1449 02/05/22 ?2324 02/06/22 ?0751  ?WBC 6.0  --   --   ?HGB 9.1* 9.3* 9.5*  ?HCT 28.2* 28.1* 28.9*  ?PLT 229  --   --   ? ?Recent Labs  ?  02/05/22 ?1449 02/06/22 ?0751  ?NA  135 137  ?K 3.6 2.8*  ?CL 100 103  ?CO2 27 26  ?GLUCOSE 425* 114*  ?BUN 16 11  ?CREATININE 1.30* 1.08*  ?CALCIUM 8.6* 8.2*  ? ?Recent Labs  ?  02/05/22 ?1449  ?PROT 6.5  ?ALBUMIN 3.7  ?AST 35  ?ALT 35  ?ALKPHOS 53  ?BILITOT 0.7  ? ? ?Past Medical History:  ?Diagnosis Date  ? Colon polyp   ? Diabetes mellitus   ? Hyperlipidemia   ? Hypertension   ? Poor appetite   ? Thyroid disease   ? ? ?Past Surgical History:  ?Procedure Laterality Date  ? BREAST EXCISIONAL BIOPSY Right   ? CARDIOVASCULAR STRESS TEST  07/15/2006  ? Normal scan, no ECG changes  ? TRANSTHORACIC ECHOCARDIOGRAM  01/08/2011  ? EF 60-65%, moderae mitral regurg, LA mild-moderately dilated,  ? ? ?Family History  ?Problem Relation Age of Onset  ? Diabetes Mother   ? Hypertension Father   ? Hyperlipidemia Father   ? Diabetes Sister   ? Hypertension Sister   ? Hyperlipidemia Sister   ? Hypertension Brother   ? Stroke Sister   ? Hypertension Sister   ? Diabetes Sister   ? Cancer Daughter   ? Heart failure Child   ? Breast cancer Neg Hx   ? ? ?Prior to Admission medications   ?Medication Sig Start Date End Date Taking? Authorizing Provider  ?amiodarone (PACERONE) 200 MG tablet Take amiodarone 400 mg (two tabs) twice daily for 1 week then take 200 mg (1 tab) twice daily for 1 week then take 200 mg (1 tab) once daily from then on ?Patient taking differently: Take 200 mg by mouth daily. 01/27/22  Yes Danford, Suann Larry, MD  ?apixaban (ELIQUIS) 2.5 MG TABS tablet TAKE 1 TABLET BY MOUTH TWICE A DAY ?Patient taking differently: Take 2.5 mg by mouth 2 (two) times daily. 01/04/22  Yes Hilty, Nadean Corwin, MD  ?atorvastatin (LIPITOR) 20 MG tablet Take 20 mg by mouth daily. 02/01/20  Yes [provider]  ?insulin aspart (NOVOLOG FLEXPEN) 100 UNIT/ML FlexPen Inject 8 Units into the skin 3 (three) times daily with meals. 09/04/21  Yes Mariel Aloe, MD  ?insulin glargine, 2 Unit Dial, (TOUJEO MAX SOLOSTAR) 300 UNIT/ML Solostar Pen Inject 12 Units into the skin  daily. 09/04/21  Yes Mariel Aloe, MD  ?KLOR-CON M20 20 MEQ tablet Take 1 tablet by mouth daily. 02/02/22  Yes [provider]  ?levothyroxine (SYNTHROID) 75 MCG tablet Take 75 mcg by mouth daily before breakfast. 03/09/21  Yes [provider]  ?rosuvastatin (CRESTOR) 40 MG tablet Take 40 mg by mouth daily. 01/29/22  Yes [provider]  ?  TIADYLT ER 180 MG 24 hr capsule TAKE 1 CAPSULE BY MOUTH EVERY DAY ?Patient taking differently: Take 180 mg by mouth daily. 11/27/21  Yes Hilty, Nadean Corwin, MD  ? ? ?Current Facility-Administered Medications  ?Medication Dose Route Frequency Provider Last Rate Last Admin  ? acetaminophen (TYLENOL) tablet 650 mg  650 mg Oral Q6H PRN Norins, Heinz Knuckles, MD      ? Or  ? acetaminophen (TYLENOL) suppository 650 mg  650 mg Rectal Q6H PRN Norins, Heinz Knuckles, MD      ? amiodarone (PACERONE) tablet 200 mg  200 mg Oral Daily Norins, Heinz Knuckles, MD      ? atorvastatin (LIPITOR) tablet 20 mg  20 mg Oral Daily Norins, Heinz Knuckles, MD      ? dextrose 5 %-0.9 % sodium chloride infusion   Intravenous Continuous Kristopher Oppenheim, DO 50 mL/hr at 02/06/22 0424 1,000 mL at 02/06/22 0424  ? diltiazem (CARDIZEM CD) 24 hr capsule 180 mg  180 mg Oral Daily Norins, Heinz Knuckles, MD      ? levothyroxine (SYNTHROID) tablet 75 mcg  75 mcg Oral Q0600 Neena Rhymes, MD   75 mcg at 02/06/22 0554  ? [START ON 02/09/2022] pantoprazole (PROTONIX) injection 40 mg  40 mg Intravenous Q12H Norins, Heinz Knuckles, MD      ? pantoprozole (PROTONIX) 80 mg /NS 100 mL infusion  8 mg/hr Intravenous Continuous Norins, Heinz Knuckles, MD 10 mL/hr at 02/06/22 0102 8 mg/hr at 02/06/22 0102  ? senna (SENOKOT) tablet 8.6 mg  1 tablet Oral BID Neena Rhymes, MD   8.6 mg at 02/05/22 2354  ? traZODone (DESYREL) tablet 25 mg  25 mg Oral QHS PRN Norins, Heinz Knuckles, MD      ? ? ?Allergies as of 02/05/2022  ? (No Known Allergies)  ? ? ?Social History  ? ?Socioeconomic History  ? Marital status: Widowed  ?  Spouse name: Not on file   ? Number of children: Not on file  ? Years of education: Not on file  ? Highest education level: Not on file  ?Occupational History  ? Not on file  ?Tobacco Use  ? Smoking status: Former  ?  Types: Cigaret

## 2022-02-06 NOTE — Hospital Course (Signed)
87 y/o with h/o hypertension, paroxysmal A-fib, type 2 diabetes, chronic kidney disease presented with concern for acute drop in hemoglobin over the last 2 weeks.  She was recently discharged from the hospital with primary admission diagnoses of A-fib, hypokalemia, hypertension, CKD, and syncope at the time, was discharged just a couple weeks ago.  Found to have a hemoglobin of 9.1.  Was hospitalized for further management.  Stool for occult blood was noted to be positive.   ?

## 2022-02-06 NOTE — Consult Note (Addendum)
? ?                                            Consultation Note ? ? ?Referring Provider: [facility] ?PCP: Karen Pretty, MD ?Primary Gastroenterologist: [name] (Dr. Billy Nichols contact 2006) ?Reason for consultation: Anemia , FOBT+  ?Hospital Day: 2 ? ?ASSESSMENT:  ? ?87 yo female with new [place] anemia / FOBT +  on Eliquis.   ?Baseline hgb ~ 13, done over last several days to 9 range.  No overt GI blood loss ? ?Paroxysmal atrial fibrillation, on Eliquis ?Last dose of Eliquis was 4/27  ? ?Remote history of colon polyps ?A small tubular adenoma was removed in 2003. No polyps on surveillance colonoscopy in 2006 ( last colonoscopy) ? ?See PMH for additional medical problems ? ? ?PLAN:  ?Will check iron studies ?Ideally she needs an EGD and colonoscopy to evaluate new anemia and FOBT + . She is in relatively good heath ( still works part time). We discussed the procedures including risks / benefits. She will think about and let us know in a little while. Clear liquids for now in case she decides to proceed.  ? ? ?Attending Physician Note  ? ?I have taken an interval history, reviewed the chart and examined the patient. I performed a substantive portion of this encounter, including complete performance of at least one of the key components, in conjunction with the APP. I agree with the APP's note, impression and recommendations with my edits. My additional impressions and recommendations are as follows.  ? ?87 y.o. female with a new anemia, heme + stool and a personal history of a small adenomatous colon polyp in 2003. Eliquis for afib is on hold. Family members at bedside.  ? ?Trend CBC ?Iron studies   ?Colonoscopy, EGD tomorrow  ?Patient's and family's questions, concerns addressed to their satisfaction ? ?Karen Edward, MD [facility] ?See AM[facility], [name], for our on call provider  ? ? ? ? ?History of Present Illness:  ?[name] is a 87 y.o. female with a past medical history significant for  Paroxysmal atrial  fibrillation on Eliquis, hypertension, HDL, DM 2, hypothyroidism, CKD stage IIIb, remote small adenomatous colon polyp, small hiatal hernia.  See PMH for any additional medical problems. ? ?Patient was hospitalized earlier this month with a syncopal episode , AKI, and atrial fibrillation with RVR, she was discharged on 4/19.  She represented to ED yesterday from PCPs office for evaluation of a drop in her hemoglobin and Hemoccult positive stool.  No overt GI bleeding.  On 01/25/2022 her hemoglobin was 13.8, it did decline during that admission to 10.0.  Yesterday in the ED it was 9.1. ? ?Ms Fossett lives alone, still works part time.  She had mild LUQ discomfort a couple of months ago but the discomfort resolved after taking a laxative.  Other than occasional constipation she has no GI symptoms.  She has not seen any blood in her stool but admits to not always paying attention to that.  Her bowel habits have not changed.  She has no nausea, vomiting nor weight loss.  She does not take NSAIDs.  ? ?Previous GI Evaluation / History   ? ?EGD 2003 (Dr. Lajoyce Nichols) ?Hiatal hernia ? ?Colonoscopy in 2003 ( Dr. Lajoyce Nichols) ?Small tubular adenoma ? ?Colonoscopy in 2006  ?No polyps ? ?Recent Labs and Imaging ?DG Chest [facility]  1 View ? ?Result Date: 01/25/2022 ?CLINICAL DATA:  Hyperglycemia. EXAM: PORTABLE CHEST 1 VIEW COMPARISON:  January 01, 2022. FINDINGS: The heart size and mediastinal contours are within normal limits. Both lungs are clear. The visualized skeletal structures are unremarkable. IMPRESSION: No active disease. Electronically Signed   By: Marijo Conception M.D.   On: 01/25/2022 13:10  ? ?[place] EKG SITE RITE ? ?Result Date: 01/26/2022 ?If [employer] not attached, placement could not be confirmed due to current cardiac rhythm.  ? ?Labs:  ?Recent Labs  ?  02/05/22 ?1449 02/05/22 ?2324 02/06/22 ?0751  ?WBC 6.0  --   --   ?HGB 9.1* 9.3* 9.5*  ?HCT 28.2* 28.1* 28.9*  ?PLT 229  --   --   ? ?Recent Labs  ?  02/05/22 ?1449 02/06/22 ?0751  ?NA  135 137  ?K 3.6 2.8*  ?CL 100 103  ?CO2 27 26  ?GLUCOSE 425* 114*  ?BUN 16 11  ?CREATININE 1.30* 1.08*  ?CALCIUM 8.6* 8.2*  ? ?Recent Labs  ?  02/05/22 ?1449  ?PROT 6.5  ?ALBUMIN 3.7  ?AST 35  ?ALT 35  ?ALKPHOS 53  ?BILITOT 0.7  ? ? ?Past Medical History:  ?Diagnosis Date  ? Colon polyp   ? Diabetes mellitus   ? Hyperlipidemia   ? Hypertension   ? Poor appetite   ? Thyroid disease   ? ? ?Past Surgical History:  ?Procedure Laterality Date  ? BREAST EXCISIONAL BIOPSY Right   ? CARDIOVASCULAR STRESS TEST  07/15/2006  ? Normal scan, no ECG changes  ? TRANSTHORACIC ECHOCARDIOGRAM  01/08/2011  ? EF 60-65%, moderae mitral regurg, LA mild-moderately dilated,  ? ? ?Family History  ?Problem Relation Age of Onset  ? Diabetes Mother   ? Hypertension Father   ? Hyperlipidemia Father   ? Diabetes Sister   ? Hypertension Sister   ? Hyperlipidemia Sister   ? Hypertension Brother   ? Stroke Sister   ? Hypertension Sister   ? Diabetes Sister   ? Cancer Daughter   ? Heart failure Child   ? Breast cancer Neg Hx   ? ? ?Prior to Admission medications   ?Medication Sig Start Date End Date Taking? Authorizing Provider  ?amiodarone (PACERONE) 200 MG tablet Take amiodarone 400 mg (two tabs) twice daily for 1 week then take 200 mg (1 tab) twice daily for 1 week then take 200 mg (1 tab) once daily from then on ?Patient taking differently: Take 200 mg by mouth daily. 01/27/22  Yes [name], Karen Larry, MD  ?apixaban (ELIQUIS) 2.5 MG TABS tablet TAKE 1 TABLET BY MOUTH TWICE A DAY ?Patient taking differently: Take 2.5 mg by mouth 2 (two) times daily. 01/04/22  Yes [name], Karen Corwin, MD  ?atorvastatin (LIPITOR) 20 MG tablet Take 20 mg by mouth daily. 02/01/20  Yes [provider]  ?insulin aspart (NOVOLOG FLEXPEN) 100 UNIT/ML FlexPen Inject 8 Units into the skin 3 (three) times daily with meals. 09/04/21  Yes Karen Aloe, MD  ?insulin glargine, 2 Unit Dial, (TOUJEO MAX SOLOSTAR) 300 UNIT/ML Solostar Pen Inject 12 Units into the skin  daily. 09/04/21  Yes Karen Aloe, MD  ?KLOR-CON M20 20 MEQ tablet Take 1 tablet by mouth daily. 02/02/22  Yes [provider]  ?levothyroxine (SYNTHROID) 75 MCG tablet Take 75 mcg by mouth daily before breakfast. 03/09/21  Yes [provider]  ?rosuvastatin (CRESTOR) 40 MG tablet Take 40 mg by mouth daily. 01/29/22  Yes [provider]  ?  TIADYLT ER 180 MG 24 hr capsule TAKE 1 CAPSULE BY MOUTH EVERY DAY ?Patient taking differently: Take 180 mg by mouth daily. 11/27/21  Yes [name], Karen Corwin, MD  ? ? ?Current Facility-Administered Medications  ?Medication Dose Route Frequency Provider Last Rate Last Admin  ? acetaminophen (TYLENOL) tablet 650 mg  650 mg Oral Q6H PRN Norins, Heinz Knuckles, MD      ? Or  ? acetaminophen (TYLENOL) suppository 650 mg  650 mg Rectal Q6H PRN Norins, Heinz Knuckles, MD      ? amiodarone (PACERONE) tablet 200 mg  200 mg Oral Daily Norins, Heinz Knuckles, MD      ? atorvastatin (LIPITOR) tablet 20 mg  20 mg Oral Daily Norins, Heinz Knuckles, MD      ? dextrose 5 %-0.9 % sodium chloride infusion   Intravenous Continuous Kristopher Oppenheim, DO 50 mL/hr at 02/06/22 0424 1,000 mL at 02/06/22 0424  ? diltiazem (CARDIZEM CD) 24 hr capsule 180 mg  180 mg Oral Daily Norins, Heinz Knuckles, MD      ? levothyroxine (SYNTHROID) tablet 75 mcg  75 mcg Oral Q0600 Neena Rhymes, MD   75 mcg at 02/06/22 0554  ? [START ON 02/09/2022] pantoprazole (PROTONIX) injection 40 mg  40 mg Intravenous Q12H Norins, Heinz Knuckles, MD      ? pantoprozole (PROTONIX) 80 mg /NS 100 mL infusion  8 mg/hr Intravenous Continuous Norins, Heinz Knuckles, MD 10 mL/hr at 02/06/22 0102 8 mg/hr at 02/06/22 0102  ? senna (SENOKOT) tablet 8.6 mg  1 tablet Oral BID Neena Rhymes, MD   8.6 mg at 02/05/22 2354  ? traZODone (DESYREL) tablet 25 mg  25 mg Oral QHS PRN Norins, Heinz Knuckles, MD      ? ? ?Allergies as of 02/05/2022  ? (No Known Allergies)  ? ? ?Social History  ? ?Socioeconomic History  ? Marital status: Widowed  ?  Spouse name: Not on file   ? Number of children: Not on file  ? Years of education: Not on file  ? Highest education level: Not on file  ?Occupational History  ? Not on file  ?Tobacco Use  ? Smoking status: Former  ?  Types: Cigaret

## 2022-02-06 NOTE — Plan of Care (Signed)
  Problem: Clinical Measurements: Goal: Respiratory complications will improve Outcome: Progressing   Problem: Clinical Measurements: Goal: Cardiovascular complication will be avoided Outcome: Progressing   

## 2022-02-06 NOTE — Progress Notes (Signed)
Started D5 NS @ 50 ml/hr due to persistent hypoglycemia.

## 2022-02-07 ENCOUNTER — Encounter (HOSPITAL_COMMUNITY)
Admission: EM | Disposition: A | Discharge status: Home Health | Payer: Self-pay | Source: Home / Self Care | Attending: Internal Medicine

## 2022-02-07 ENCOUNTER — Inpatient Hospital Stay (HOSPITAL_COMMUNITY): Payer: Medicare PPO | Admitting: Anesthesiology

## 2022-02-07 ENCOUNTER — Encounter (HOSPITAL_COMMUNITY): Payer: Self-pay | Admitting: Internal Medicine

## 2022-02-07 DIAGNOSIS — E039 Hypothyroidism, unspecified: Secondary | ICD-10-CM | POA: Diagnosis not present

## 2022-02-07 DIAGNOSIS — K6289 Other specified diseases of anus and rectum: Secondary | ICD-10-CM

## 2022-02-07 DIAGNOSIS — I480 Paroxysmal atrial fibrillation: Secondary | ICD-10-CM | POA: Diagnosis not present

## 2022-02-07 DIAGNOSIS — K626 Ulcer of anus and rectum: Secondary | ICD-10-CM

## 2022-02-07 DIAGNOSIS — K5730 Diverticulosis of large intestine without perforation or abscess without bleeding: Secondary | ICD-10-CM

## 2022-02-07 DIAGNOSIS — I10 Essential (primary) hypertension: Secondary | ICD-10-CM | POA: Diagnosis not present

## 2022-02-07 DIAGNOSIS — K449 Diaphragmatic hernia without obstruction or gangrene: Secondary | ICD-10-CM

## 2022-02-07 DIAGNOSIS — R195 Other fecal abnormalities: Secondary | ICD-10-CM

## 2022-02-07 DIAGNOSIS — D649 Anemia, unspecified: Secondary | ICD-10-CM | POA: Diagnosis not present

## 2022-02-07 HISTORY — PX: COLONOSCOPY WITH PROPOFOL: SHX5780

## 2022-02-07 HISTORY — PX: BIOPSY: SHX5522

## 2022-02-07 HISTORY — PX: ESOPHAGOGASTRODUODENOSCOPY (EGD) WITH PROPOFOL: SHX5813

## 2022-02-07 LAB — MAGNESIUM: Magnesium: 2 mg/dL (ref 1.7–2.4)

## 2022-02-07 LAB — BASIC METABOLIC PANEL
Anion gap: 8 (ref 5–15)
BUN: 7 mg/dL — ABNORMAL LOW (ref 8–23)
CO2: 23 mmol/L (ref 22–32)
Calcium: 8.2 mg/dL — ABNORMAL LOW (ref 8.9–10.3)
Chloride: 106 mmol/L (ref 98–111)
Creatinine, Ser: 0.9 mg/dL (ref 0.44–1.00)
GFR, Estimated: >60 mL/min (ref >60)
Glucose, Bld: 104 mg/dL — ABNORMAL HIGH (ref 70–99)
Potassium: 3.1 mmol/L — ABNORMAL LOW (ref 3.5–5.1)
Sodium: 137 mmol/L (ref 135–145)

## 2022-02-07 LAB — CBC
HCT: 28.5 % — ABNORMAL LOW (ref 36.0–46.0)
Hemoglobin: 9.5 g/dL — ABNORMAL LOW (ref 12.0–15.0)
MCH: 30.2 pg (ref 26.0–34.0)
MCHC: 33.3 g/dL (ref 30.0–36.0)
MCV: 90.5 fL (ref 80.0–100.0)
Platelets: 205 10*3/uL (ref 150–400)
RBC: 3.15 MIL/uL — ABNORMAL LOW (ref 3.87–5.11)
RDW: 15 % (ref 11.5–15.5)
WBC: 5.3 10*3/uL (ref 4.0–10.5)
nRBC: 0 % (ref 0.0–0.2)

## 2022-02-07 LAB — GLUCOSE, CAPILLARY
Glucose-Capillary: 108 mg/dL — ABNORMAL HIGH (ref 70–99)
Glucose-Capillary: 121 mg/dL — ABNORMAL HIGH (ref 70–99)
Glucose-Capillary: 170 mg/dL — ABNORMAL HIGH (ref 70–99)
Glucose-Capillary: 256 mg/dL — ABNORMAL HIGH (ref 70–99)
Glucose-Capillary: 341 mg/dL — ABNORMAL HIGH (ref 70–99)
Glucose-Capillary: 375 mg/dL — ABNORMAL HIGH (ref 70–99)
Glucose-Capillary: 85 mg/dL (ref 70–99)

## 2022-02-07 LAB — FOLATE: Folate: 18.9 ng/mL (ref >5.9)

## 2022-02-07 LAB — VITAMIN B12: Vitamin B-12: 983 pg/mL — ABNORMAL HIGH (ref 180–914)

## 2022-02-07 SURGERY — COLONOSCOPY WITH PROPOFOL
Anesthesia: Monitor Anesthesia Care

## 2022-02-07 MED ORDER — LACTATED RINGERS IV SOLN
INTRAVENOUS | Status: DC | PRN
Start: 2022-02-07 — End: 2022-02-07

## 2022-02-07 MED ORDER — PHENYLEPHRINE 80 MCG/ML (10ML) SYRINGE FOR IV PUSH (FOR BLOOD PRESSURE SUPPORT)
PREFILLED_SYRINGE | INTRAVENOUS | Status: DC | PRN
  Administered 2022-02-07: 160 ug via INTRAVENOUS
  Administered 2022-02-07: 80 ug via INTRAVENOUS
  Administered 2022-02-07: 160 ug via INTRAVENOUS

## 2022-02-07 MED ORDER — POTASSIUM CHLORIDE 10 MEQ/100ML IV SOLN
10.0000 meq | INTRAVENOUS | Status: AC
  Administered 2022-02-07: 10 meq via INTRAVENOUS
  Filled 2022-02-07: qty 100

## 2022-02-07 MED ORDER — POTASSIUM CHLORIDE 10 MEQ/100ML IV SOLN
10.0000 meq | Freq: Once | INTRAVENOUS | Status: AC
  Administered 2022-02-07: 10 meq via INTRAVENOUS
  Filled 2022-02-07: qty 100

## 2022-02-07 MED ORDER — PROPOFOL 10 MG/ML IV BOLUS
INTRAVENOUS | Status: DC | PRN
  Administered 2022-02-07: 20 mg via INTRAVENOUS

## 2022-02-07 MED ORDER — EPHEDRINE SULFATE-NACL 50-0.9 MG/10ML-% IV SOSY
PREFILLED_SYRINGE | INTRAVENOUS | Status: DC | PRN
  Administered 2022-02-07 (×2): 5 mg via INTRAVENOUS

## 2022-02-07 MED ORDER — POTASSIUM CHLORIDE CRYS ER 20 MEQ PO TBCR
60.0000 meq | EXTENDED_RELEASE_TABLET | Freq: Once | ORAL | Status: AC
  Administered 2022-02-07: 60 meq via ORAL
  Filled 2022-02-07: qty 3

## 2022-02-07 MED ORDER — LACTATED RINGERS IV SOLN
INTRAVENOUS | Status: AC | PRN
  Administered 2022-02-07: 20 mL/h via INTRAVENOUS

## 2022-02-07 MED ORDER — POLYETHYLENE GLYCOL 3350 17 G PO PACK
17.0000 g | PACK | Freq: Every day | ORAL | Status: DC
  Administered 2022-02-07 – 2022-02-08 (×2): 17 g via ORAL
  Filled 2022-02-07 (×2): qty 1

## 2022-02-07 MED ORDER — PROPOFOL 500 MG/50ML IV EMUL
INTRAVENOUS | Status: DC | PRN
Start: 2022-02-07 — End: 2022-02-07
  Administered 2022-02-07: 125 ug/kg/min via INTRAVENOUS

## 2022-02-07 MED ORDER — PANTOPRAZOLE SODIUM 40 MG PO TBEC
40.0000 mg | DELAYED_RELEASE_TABLET | Freq: Every day | ORAL | Status: DC
  Administered 2022-02-07 – 2022-02-08 (×2): 40 mg via ORAL
  Filled 2022-02-07 (×4): qty 1

## 2022-02-07 SURGICAL SUPPLY — 25 items

## 2022-02-07 NOTE — Evaluation (Signed)
Physical Therapy Evaluation ?Patient Details ?Name: [name] ?MRN: [id number] ?DOB: 01/06/1935 ?Today's Date: 02/07/2022 ? ?History of Present Illness ? 87 y/o with h/o hypertension, paroxysmal A-fib, type 2 diabetes, chronic kidney disease presented with concern for acute drop in hemoglobin over the last 2 weeks.  She was recently discharged from the hospital with primary admission diagnoses of A-fib, hypokalemia, hypertension, CKD, and syncope at the time, was discharged just a couple weeks ago.  Found to have a hemoglobin of 9.1.  Was hospitalized for further management.  Stool for occult blood was noted to be positive.  ?Clinical Impression ?  ?Pt admitted with above diagnosis. Lives at home alone, daughter can help intermittently, in a two-level home with 1 steps to enter; Prior to admission, pt was able to manage independently, did not use an assitive device to walk; Presents to PT with generalized weakness and fatigue; min assist for bed mobiltiy and to stand; pt declined progressive amb despite encouragement;  Pt currently with functional limitations due to the deficits listed below (see PT Problem List). Pt will benefit from skilled PT to increase their independence and safety with mobility to allow discharge to the venue listed below.      ?   ? ?Recommendations for follow up therapy are one component of a multi-disciplinary discharge planning process, led by the attending physician.  Recommendations may be updated based on patient status, additional functional criteria and insurance authorization. ? ?Follow Up Recommendations Home health PT ? ?  ?Assistance Recommended at Discharge PRN  ?Patient can return home with the following ? Assistance with cooking/housework ? ?  ?Equipment Recommendations Rollator (4 wheels) (consider a rollator)  ?Recommendations for Other Services ? Other (comment) (Mobility Team)  ?  ?Functional Status Assessment Patient has had a recent decline in their functional status and  demonstrates the ability to make significant improvements in function in a reasonable and predictable amount of time.  ? ?  ?Precautions / Restrictions Precautions ?Precautions: Other (comment) ?Precaution Comments: incontinent of urine ?Restrictions ?Weight Bearing Restrictions: No  ? ?  ? ?Mobility ? Bed Mobility ?Overal bed mobility: Needs Assistance ?Bed Mobility: Supine to Sit ?  ?  ?Supine to sit: Supervision ?  ?  ?General bed mobility comments: Cues to fully scoot to EOB ?  ? ?Transfers ?Overall transfer level: Needs assistance ?Equipment used: 1 person hand held assist ?Transfers: Sit to/from Stand ?Sit to Stand: Min assist ?  ?  ?  ?  ?  ?General transfer comment: Min assist to steady ?  ? ?Ambulation/Gait ?Ambulation/Gait assistance: Min assist ?Gait Distance (Feet): 2 Feet (sidesteps towards [facility]) ?Assistive device: 1 person hand held assist ?Gait Pattern/deviations: Shuffle ?  ?  ?  ?General Gait Details: Declined walking around room or hall; took sidesteps towared her L side for better bed positioning when she sits ? ?Stairs ?  ?  ?  ?  ?  ? ?Wheelchair Mobility ?  ? ?Modified Rankin (Stroke Patients Only) ?  ? ?  ? ?Balance   ?  ?  ?  ?  ?  ?  ?  ?  ?  ?  ?  ?  ?  ?  ?  ?  ?  ?  ?   ? ? ? ?Pertinent Vitals/Pain Pain Assessment ?Pain Assessment: No/denies pain  ? ? ?Home Living Family/patient expects to be discharged to:: Private residence ?Living Arrangements: Alone ?Available Help at Discharge: Family;Available PRN/intermittently ?Type of Home: House ?Home Access: Stairs to  enter ?Entrance Stairs-Rails: None ?Entrance Stairs-Number of Steps: 1 ?  ?Home Layout: Laundry or work area in basement;Able to live on main level with bedroom/bathroom ?Home Equipment: Conservation officer, nature (2 wheels) ?   ?  ?Prior Function Prior Level of Function : Independent/Modified Independent ?  ?  ?  ?  ?  ?  ?Mobility Comments: Still driving, up until previous admission ?  ?  ? ? ?Hand Dominance  ? Dominant Hand: Right ? ?   ?Extremity/Trunk Assessment  ? Upper Extremity Assessment ?Upper Extremity Assessment: Defer to OT evaluation ?  ? ?Lower Extremity Assessment ?Lower Extremity Assessment: Generalized weakness ?  ? ?   ?Communication  ? Communication: No difficulties  ?Cognition Arousal/Alertness: Awake/alert ?Behavior During Therapy: [facility] for tasks assessed/performed ?Overall Cognitive Status: Within Functional Limits for tasks assessed ?  ?  ?  ?  ?  ?  ?  ?  ?  ?  ?  ?  ?  ?  ?  ?  ?General Comments: Very hesitant to get up and move due to lines and leads; reassured pt that lines an dleads can be managed ?  ?  ? ?  ?General Comments General comments (skin integrity, edema, etc.): Hesitant to walk; tired from EGD earlier today ? ?  ?Exercises    ? ?Assessment/Plan  ?  ?PT Assessment Patient needs continued PT services  ?PT Problem List Decreased strength;Decreased activity tolerance;Decreased balance;Decreased mobility;Decreased knowledge of use of DME;Decreased safety awareness ? ?   ?  ?PT Treatment Interventions DME instruction;Gait training;Stair training;Functional mobility training;Therapeutic activities;Therapeutic exercise;Balance training;Patient/family education   ? ?PT Goals (Current goals can be found in the Care Plan section)  ?Acute Rehab PT Goals ?Patient Stated Goal: her immediate goal today was to rest ?PT Goal Formulation: With patient ?Time For Goal Achievement: 02/14/22 ?Potential to Achieve Goals: Good ? ?  ?Frequency Min 3X/week ?  ? ? ?Co-evaluation   ?  ?  ?  ?  ? ? ?  ?AM-PAC PT "6 Clicks" Mobility  ?Outcome Measure Help needed turning from your back to your side while in a flat bed without using bedrails?: None ?Help needed moving from lying on your back to sitting on the side of a flat bed without using bedrails?: A Little ?Help needed moving to and from a bed to a chair (including a wheelchair)?: A Little ?Help needed standing up from a chair using your arms (e.g., wheelchair or bedside chair)?: A  Little ?Help needed to walk in hospital room?: A Little ?Help needed climbing 3-5 steps with a railing? : A Lot ?6 Click Score: 18 ? ?  ?End of Session   ?Activity Tolerance: Patient limited by fatigue ?Patient left: in bed;with call bell/phone within reach ?Nurse Communication: Mobility status;Other (comment) (consider replacing purewick) ?PT Visit Diagnosis: Unsteadiness on feet (R26.81);Other abnormalities of gait and mobility (R26.89) ?  ? ?Time: 2637-8588 ?PT Time Calculation (min) (ACUTE ONLY): 23 min ? ? ?Charges:   PT Evaluation ?$PT Eval Low Complexity: 1 Low ?PT Treatments ?$Therapeutic Activity: 8-22 mins ?  ?   ? ? ?Roney Marion, PT  ?Acute Rehabilitation Services ?Office [phone] ? ? ?[place] ?02/07/2022, 5:31 PM ? ?

## 2022-02-07 NOTE — Op Note (Signed)
[facility] ?Patient Name: [name] ?Procedure Date : 02/07/2022 ?MRN: [id number] ?Attending MD: Ladene Artist , MD ?Date of Birth: [birth year] ?CSN: [id number] ?Age: 87 ?Admit Type: Inpatient ?Procedure:                Colonoscopy ?Indications:              Heme positive stool, anemia ?Providers:                Pricilla Riffle. Fuller Plan, MD, Grace Isaac, RN, [place]  ?                          [place], Technician, Vickii Penna, CRNA ?Referring MD:             TRH ?Medicines:                Monitored Anesthesia Care ?Complications:            No immediate complications. Estimated blood loss:  ?                          None. ?Estimated Blood Loss:     Estimated blood loss: none. ?Procedure:                Pre-Anesthesia Assessment: ?                          - Prior to the procedure, a History and Physical  ?                          was performed, and patient medications and  ?                          allergies were reviewed. The patient's tolerance of  ?                          previous anesthesia was also reviewed. The risks  ?                          and benefits of the procedure and the sedation  ?                          options and risks were discussed with the patient.  ?                          All questions were answered, and informed consent  ?                          was obtained. Prior Anticoagulants: The patient has  ?                          taken Eliquis (apixaban), last dose was 2 days  ?                          prior to procedure. ASA Grade Assessment: III - A  ?  patient with severe systemic disease. After  ?                          reviewing the risks and benefits, the patient was  ?                          deemed in satisfactory condition to undergo the  ?                          procedure. ?                          After obtaining informed consent, the colonoscope  ?                          was passed under direct vision. Throughout the  ?                           procedure, the patient's blood pressure, pulse, and  ?                          oxygen saturations were monitored continuously. The  ?                          [id number]-HQ190L ([id number]) Olympus peds colonscope was  ?                          introduced through the anus and advanced to the the  ?                          cecum, identified by appendiceal orifice and  ?                          ileocecal valve. The ileocecal valve, appendiceal  ?                          orifice, and rectum were photographed. The quality  ?                          of the bowel preparation was adequate. The  ?                          colonoscopy was somewhat difficult due to a  ?                          redundant colon, significant looping and a tortuous  ?                          colon. The patient tolerated the procedure well. ?Scope In: 10:05:10 AM ?Scope Out: 10:34:23 AM ?Scope Withdrawal Time: 0 hours 23 minutes 31 seconds  ?Total Procedure Duration: 0 hours 29 minutes 13 seconds  ?Findings: ?     The perianal and digital rectal examinations were normal. ?     Many medium-mouthed diverticula were found in the left colon. There was  ?  narrowing of the colon in association with the diverticular opening.  ?     There was evidence of diverticular spasm. There was no evidence of  ?     diverticular bleeding. ?     A segmental area of mildly erythematous mucosa was found in the mid  ?     rectum. Biopsies were taken with a cold forceps for histology. ?     A single (solitary) four mm ulcer with surrounding erythema was found in  ?     the distal rectum. No bleeding was present. No stigmata of recent  ?     bleeding were seen. Biopsies were taken with a cold forceps for  ?     histology. ?     The exam was otherwise without abnormality on direct and retroflexion  ?     views. ?Impression:               - Moderate diverticulosis in the left colon. ?                          - Erythematous mucosa in the mid rectum.  Biopsied. ?                          - A single (solitary) ulcer in the distal rectum.  ?                          Biopsied. ?                          - The examination was otherwise normal on direct  ?                          and retroflexion views. ?Recommendation:           - Heme + stool from rectal ulcer. No source noted  ?                          for anemia. ?                          - Resume Eliquis (apixaban) in 3 days at prior dose  ?                          as indicated. Refer to managing physician for  ?                          further adjustment of therapy. ?                          - Return patient to hospital ward for ongoing care. ?                          - High fiber diet. ?                          - Continue present medications. ?                          -  Miralax qd. ?                          - Await pathology results. ?Procedure Code(s):        --- Professional --- ?                          [phone], Colonoscopy, flexible; with biopsy, single  ?                          or multiple ?Diagnosis Code(s):        --- Professional --- ?                          K62.89, Other specified diseases of anus and rectum ?                          K62.6, Ulcer of anus and rectum ?                          R19.5, Other fecal abnormalities ?                          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                          K57.30, Diverticulosis of large intestine without  ?                          perforation or abscess without bleeding ?CPT copyright 2019 American Medical Association. All rights reserved. ?The codes documented in this report are preliminary and upon coder review may  ?be revised to meet current compliance requirements. ?Ladene Artist, MD ?02/07/2022 11:00:34 AM ?This report has been signed electronically. ?Number of Addenda: 0 ?

## 2022-02-07 NOTE — Anesthesia Preprocedure Evaluation (Signed)
Anesthesia Evaluation  ?Patient identified by MRN, date of birth, ID band ?Patient awake ? ? ? ?Reviewed: ?Allergy & Precautions, NPO status , Patient's Chart, lab work & pertinent test results ? ?Airway ?Mallampati: II ? ?TM Distance: >3 FB ?Neck ROM: Full ? ? ? Dental ? ?(+) Dental Advisory Given ?  ?Pulmonary ?former smoker,  ?  ?breath sounds clear to auscultation ? ? ? ? ? ? Cardiovascular ?hypertension, Pt. on medications ?+ dysrhythmias Atrial Fibrillation  ?Rhythm:Regular Rate:Normal ? ? ?  ?Neuro/Psych ?negative neurological ROS ?   ? GI/Hepatic ?Neg liver ROS, GI bleed ? ?  ?Endo/Other  ?diabetesHypothyroidism  ? Renal/GU ?Renal InsufficiencyRenal disease  ? ?  ?Musculoskeletal ? ? Abdominal ?  ?Peds ? Hematology ? ?(+) Blood dyscrasia, anemia ,   ?Anesthesia Other Findings ? ? Reproductive/Obstetrics ? ?  ? ? ? ? ? ? ? ? ? ? ? ? ? ?  ?  ? ? ? ? ? ? ? ? ?Anesthesia Physical ?Anesthesia Plan ? ?ASA: 3 ? ?Anesthesia Plan: MAC  ? ?Post-op Pain Management: Minimal or no pain anticipated  ? ?Induction:  ? ?PONV Risk Score and Plan: 2 and Propofol infusion ? ?Airway Management Planned: Natural Airway and Nasal Cannula ? ?Additional Equipment:  ? ?Intra-op Plan:  ? ?Post-operative Plan:  ? ?Informed Consent: I have reviewed the patients History and Physical, chart, labs and discussed the procedure including the risks, benefits and alternatives for the proposed anesthesia with the patient or authorized representative who has indicated his/her understanding and acceptance.  ? ? ? ? ? ?Plan Discussed with:  ? ?Anesthesia Plan Comments:   ? ? ? ? ? ? ?Anesthesia Quick Evaluation ? ?

## 2022-02-07 NOTE — Interval H&P Note (Signed)
History and Physical Interval Note: ? ?02/07/2022 ?9:30 AM ? ?[name]  has presented today for surgery, with the diagnosis of Anemia.  The various methods of treatment have been discussed with the patient and family. After consideration of risks, benefits and other options for treatment, the patient has consented to  Procedure(s): ?COLONOSCOPY WITH PROPOFOL (N/A) ?ESOPHAGOGASTRODUODENOSCOPY (EGD) WITH PROPOFOL (N/A) as a surgical intervention.  The patient's history has been reviewed, patient examined, no change in status, stable for surgery.  I have reviewed the patient's chart and labs.  Questions were answered to the patient's satisfaction.   ? ? ?Pricilla Riffle. Fuller Plan ? ? ?

## 2022-02-07 NOTE — Op Note (Addendum)
[facility] ?Patient Name: [name] ?Procedure Date : 02/07/2022 ?MRN: [id number] ?Attending MD: Ladene Artist , MD ?Date of Birth: 04-Jun-1935 ?CSN: [id number] ?Age: 87 ?Admit Type: Inpatient ?Procedure:                Upper GI endoscopy ?Indications:              Heme positive stool, anemia ?Providers:                Pricilla Riffle. Fuller Plan, MD, Grace Isaac, RN, [place]  ?                          [place], Technician, Vickii Penna, CRNA ?Referring MD:             TRH ?Medicines:                Monitored Anesthesia Care ?Complications:            No immediate complications. ?Estimated Blood Loss:     Estimated blood loss: none. ?Procedure:                Pre-Anesthesia Assessment: ?                          - Prior to the procedure, a History and Physical  ?                          was performed, and patient medications and  ?                          allergies were reviewed. The patient's tolerance of  ?                          previous anesthesia was also reviewed. The risks  ?                          and benefits of the procedure and the sedation  ?                          options and risks were discussed with the patient.  ?                          All questions were answered, and informed consent  ?                          was obtained. Prior Anticoagulants: The patient has  ?                          taken Eliquis (apixaban), last dose was 2 days  ?                          prior to procedure. ASA Grade Assessment: III - A  ?                          patient with severe systemic disease. After  ?  reviewing the risks and benefits, the patient was  ?                          deemed in satisfactory condition to undergo the  ?                          procedure. ?                          After obtaining informed consent, the endoscope was  ?                          passed under direct vision. Throughout the  ?                          procedure, the patient's blood  pressure, pulse, and  ?                          oxygen saturations were monitored continuously. The  ?                          [id number]-[id number] ([id number]) Olympus endoscope was introduced  ?                          through the mouth, and advanced to the second part  ?                          of duodenum. The upper GI endoscopy was  ?                          accomplished without difficulty. The patient  ?                          tolerated the procedure well. ?Scope In: ?Scope Out: ?Findings: ?     The esophagus was normal. ?     A small hiatal hernia was present. ?     The exam of the stomach was otherwise normal. ?     The examined duodenum was normal. ?     The cardia and gastric fundus were normal on retroflexion. ?Impression:               - Normal esophagus. ?                          - Small hiatal hernia. ?                          - Normal examined duodenum. ?                          - No specimens collected. ?Recommendation:           - High fiber diet. ?                          - Continue present medications. ?                          -  Follow antireflux measures prn. ?                          - Pantoprazole 40 mg po qd prn. ?                          - Return patient to hospital ward for ongoing care. ?                          - Resume Eliquis (apixaban) at prior dose in 3  ?                          days. Refer to managing physician for further  ?                          adjustment of therapy. ?                          - See colonoscopy report for additional  ?                          recommendations. ?                          - GI signing off. Outpatient GI follow up prn. ?Procedure Code(s):        --- Professional --- ?                          [phone], Esophagogastroduodenoscopy, flexible,  ?                          transoral; diagnostic, including collection of  ?                          specimen(s) by brushing or washing, when performed  ?                          (separate  procedure) ?Diagnosis Code(s):        --- Professional --- ?                          K44.9, Diaphragmatic hernia without obstruction or  ?                          gangrene ?                          R19.5, Other fecal abnormalities ?CPT copyright 2019 American Medical Association. All rights reserved. ?The codes documented in this report are preliminary and upon coder review may  ?be revised to meet current compliance requirements. ?Ladene Artist, MD ?02/07/2022 11:06:40 AM ?This report has been signed electronically. ?Number of Addenda: 0 ?

## 2022-02-07 NOTE — Progress Notes (Signed)
? ?[facility] ?PROGRESS NOTE ? ? [name] FAO:[id number] DOB: 1934/11/28 DOA: 02/05/2022  2 ?DOS: the patient was seen and examined on 02/07/2022 ? ?PCP: Deland Pretty, MD ? ?Brief History and Hospital Course:  ?87 y/o with h/o hypertension, paroxysmal A-fib, type 2 diabetes, chronic kidney disease presented with concern for acute drop in hemoglobin over the last 2 weeks.  She was recently discharged from the hospital with primary admission diagnoses of A-fib, hypokalemia, hypertension, CKD, and syncope at the time, was discharged just a couple weeks ago.  Found to have a hemoglobin of 9.1.  Was hospitalized for further management.  Stool for occult blood was noted to be positive.   ? ?Consultants: Gastroenterology ? ?Procedures: EGD and colonoscopy planned for today ? ? ? ?Subjective: ?Patient slightly anxious.  Denies any nausea vomiting.  Did have some abdominal cramping this morning.  Likely secondary to bowel prep.  No shortness of breath or chest pain.   ? ? ? ?Assessment/Plan: ? ? ?* Acute anemia ?There is concern for GI loss.  Stool was noted to be heme positive.  No overt bleeding has been noted however.  ?Hemoglobin was noted to be 12.9 on April 17.  Came in with a hemoglobin of 9.1. ?Anemia panel showed ferritin of 244, TIBC 248, iron 53.  B12 level 983.  Folate 18.9.   ?Hemoglobin stable this morning.   ?Plan is for EGD and colonoscopy today.  Gastroenterology following. Continue PPI. ? ?Type 2 diabetes mellitus ([place]) ?Insulin dependent. Last A1C 01/26/22 12.4%.  On Lantus prior to admission.   ?Patient had hypoglycemic episodes and had to be placed on D5 infusion.  CBGs subsequently improved and she became hyperglycemic.  Started back on SSI.  Continue to monitor for now.  Glargine on hold currently. ? ?Essential hypertension ?Continue diltiazem.  Blood pressure reasonably well controlled. ? ?PAF (paroxysmal atrial fibrillation) ([place]) ?On amiodarone, apixaban and diltiazem prior to admission.   Apixaban on hold due to concern for GI bleed.  Monitor on telemetry.   ? ?Hypercholesterolemia ?Continue atorvastatin.   ? ?Hypothyroidism ?Denies any noncompliance with her levothyroxine.  TSH noted to be significantly elevated at 22.  TSH was elevated at 35 on April 17.  No dose adjustments were made at that time.   ?Dose of levothyroxine was increased to 88 mcg daily.  Will need to recheck her TSH in a few weeks.   ? ?Stage 3b chronic kidney disease (CKD) ([place]) ?Hypokalemia ? ?Renal function stable.  Potassium better but still low.  Will be repleted.  Magnesium 2.0. ? ? ?DVT Prophylaxis: SCDs for now ?Code Status: Full code ?Family Communication: Discussed with patient ?Disposition Plan: Hopefully return home when improved.  PT and OT evaluation prior to discharge. ? ?Status is: Inpatient ?Remains inpatient appropriate because: Concern for GI bleed ? ? ? ? ?Medications: Scheduled: ? amiodarone  200 mg Oral Daily  ? atorvastatin  20 mg Oral Daily  ? diltiazem  180 mg Oral Daily  ? insulin aspart  0-15 Units Subcutaneous TID WC  ? levothyroxine  88 mcg Oral Q0600  ? [START ON 02/09/2022] pantoprazole  40 mg Intravenous Q12H  ? potassium chloride  60 mEq Oral Once  ? senna  1 tablet Oral BID  ? ?Continuous: ? dextrose 5 % and 0.9% NaCl Stopped (02/06/22 1410)  ? pantoprazole 8 mg/hr (02/07/22 0310)  ? potassium chloride 10 mEq (02/07/22 0823)  ? ?ONG:[id number] **OR** acetaminophen, traZODone ? ?Antibiotics: ?Anti-infectives (From admission, onward)  ? ?  None  ? ?  ? ? ?Objective: ? ?Vital Signs ? ?Vitals:  ? 02/06/22 0914 02/06/22 1755 02/06/22 2042 02/07/22 0422  ?BP: 127/63 (!) 130/51 103/82 103/65  ?Pulse: 63 62 65 62  ?Resp: '19 17 18 17  '$ ?Temp: 98.4 ?F (36.9 ?C) 99 ?F (37.2 ?C) 98 ?F (36.7 ?C) 97.9 ?F (36.6 ?C)  ?TempSrc: Oral Oral Oral   ?SpO2: 100% 99% 100% 99%  ?Weight:      ?Height:      ? ? ?Intake/Output Summary (Last 24 hours) at 02/07/2022 0852 ?Last data filed at 02/06/2022 1730 ?Gross per 24 hour   ?Intake 1678.12 ml  ?Output 1001 ml  ?Net 677.12 ml  ? ? ?Filed Weights  ? 02/05/22 1444 02/05/22 2253  ?Weight: 65.3 kg 65.9 kg  ? ? ?General appearance: Awake alert.  In no distress ?Resp: Clear to auscultation bilaterally.  Normal effort ?Cardio: S1-S2 is normal regular.  No S3-S4.  No rubs murmurs or bruit ?GI: Abdomen is soft.  Mildly tender in the mid abdomen without any rebound rigidity or guarding.  No masses organomegaly. ?Extremities: No edema.  Full range of motion of lower extremities. ?Neurologic: Alert and oriented x3.  No focal neurological deficits.  ? ? ?Lab Results: ? ?Data Reviewed: I have personally reviewed labs and imaging study reports ? ?CBC: ?Recent Labs  ?Lab 02/05/22 ?1449 02/05/22 ?2324 02/06/22 ?6015 02/07/22 ?0142  ?WBC 6.0  --   --  5.3  ?NEUTROABS 4.8  --   --   --   ?HGB 9.1* 9.3* 9.5* 9.5*  ?HCT 28.2* 28.1* 28.9* 28.5*  ?MCV 91.9  --   --  90.5  ?PLT 229  --   --  205  ? ? ? ?Basic Metabolic Panel: ?Recent Labs  ?Lab 02/05/22 ?1449 02/05/22 ?1514 02/06/22 ?0751 02/07/22 ?0142  ?NA 135  --  137 137  ?K 3.6  --  2.8* 3.1*  ?CL 100  --  103 106  ?CO2 27  --  26 23  ?GLUCOSE 425*  --  114* 104*  ?BUN 16  --  11 7*  ?CREATININE 1.30*  --  1.08* 0.90  ?CALCIUM 8.6*  --  8.2* 8.2*  ?MG  --  2.0  --  2.0  ? ? ? ?GFR: ?Estimated Creatinine Clearance: 41.4 mL/min (by C-G formula based on SCr of 0.9 mg/dL). ? ?Liver Function Tests: ?Recent Labs  ?Lab 02/05/22 ?1449  ?AST 35  ?ALT 35  ?ALKPHOS 53  ?BILITOT 0.7  ?PROT 6.5  ?ALBUMIN 3.7  ? ? ? ? ?CBG: ?Recent Labs  ?Lab 02/06/22 ?1627 02/06/22 ?2042 02/06/22 ?2327 02/07/22 ?0410 02/07/22 ?6153  ?GLUCAP 357* 254* 159* 85 170*  ? ? ? ? ?Thyroid Function Tests: ?Recent Labs  ?  02/05/22 ?2324  ?TSH 22.015*  ? ? ? ?Anemia Panel: ?Recent Labs  ?  02/06/22 ?0751 02/07/22 ?0142  ?VITAMINB12  --  983*  ?FOLATE  --  18.9  ?FERRITIN 244  --   ?TIBC 248*  --   ?IRON 53  --   ? ? ? ? ?Radiology Studies: ?No results found. ? ? ? ? LOS: 2 days  ? ?Bonnielee Haff ? ?[facility] ?Pager on [email or web address] ? ?02/07/2022, 8:52 AM ? ? ?

## 2022-02-07 NOTE — Anesthesia Postprocedure Evaluation (Signed)
Anesthesia Post Note ? ?Patient: [name] ? ?Procedure(s) Performed: COLONOSCOPY WITH PROPOFOL ?ESOPHAGOGASTRODUODENOSCOPY (EGD) WITH PROPOFOL ?BIOPSY ? ?  ? ?Patient location during evaluation: PACU ?Anesthesia Type: MAC ?Level of consciousness: awake and alert ?Pain management: pain level controlled ?Vital Signs Assessment: post-procedure vital signs reviewed and stable ?Respiratory status: spontaneous breathing, nonlabored ventilation, respiratory function stable and patient connected to nasal cannula oxygen ?Cardiovascular status: stable and blood pressure returned to baseline ?Postop Assessment: no apparent nausea or vomiting ?Anesthetic complications: no ? ? ?No notable events documented. ? ?Last Vitals:  ?Vitals:  ? 02/07/22 1700 02/07/22 2046  ?BP: 132/70 (!) 111/50  ?Pulse: 70 60  ?Resp: 20 (!) 22  ?Temp: 36.5 ?C 36.8 ?C  ?SpO2: 99% 100%  ?  ?Last Pain:  ?Vitals:  ? 02/07/22 2046  ?TempSrc: Oral  ?PainSc:   ? ? ?  ?  ?  ?  ?  ?  ? ?Suzette Battiest E ? ? ? ? ?

## 2022-02-07 NOTE — Transfer of Care (Signed)
Immediate Anesthesia Transfer of Care Note ? ?Patient: [name] ? ?Procedure(s) Performed: COLONOSCOPY WITH PROPOFOL ?ESOPHAGOGASTRODUODENOSCOPY (EGD) WITH PROPOFOL ?BIOPSY ? ?Patient Location: PACU ? ?Anesthesia Type:MAC ? ?Level of Consciousness: awake, alert  and patient cooperative ? ?Airway & Oxygen Therapy: Patient Spontanous Breathing ? ?Post-op Assessment: Report given to RN and Post -op Vital signs reviewed and stable ? ?Post vital signs: Reviewed and stable ? ?Last Vitals:  ?Vitals Value Taken Time  ?BP 92/41 02/07/22 1053  ?Temp 36.1 ?C 02/07/22 1051  ?Pulse 67 02/07/22 1053  ?Resp 18 02/07/22 1053  ?SpO2 100 % 02/07/22 1053  ?Vitals shown include unvalidated device data. ? ?Last Pain:  ?Vitals:  ? 02/07/22 1051  ?TempSrc:   ?PainSc: 0-No pain  ?   ? ?  ? ?Complications: No notable events documented. ?

## 2022-02-08 ENCOUNTER — Encounter (HOSPITAL_COMMUNITY): Payer: Self-pay | Admitting: Gastroenterology

## 2022-02-08 DIAGNOSIS — D649 Anemia, unspecified: Secondary | ICD-10-CM | POA: Diagnosis not present

## 2022-02-08 LAB — CBC
HCT: 27.1 % — ABNORMAL LOW (ref 36.0–46.0)
Hemoglobin: 9.1 g/dL — ABNORMAL LOW (ref 12.0–15.0)
MCH: 30.2 pg (ref 26.0–34.0)
MCHC: 33.6 g/dL (ref 30.0–36.0)
MCV: 90 fL (ref 80.0–100.0)
Platelets: 191 10*3/uL (ref 150–400)
RBC: 3.01 MIL/uL — ABNORMAL LOW (ref 3.87–5.11)
RDW: 15.3 % (ref 11.5–15.5)
WBC: 5.1 10*3/uL (ref 4.0–10.5)
nRBC: 0 % (ref 0.0–0.2)

## 2022-02-08 LAB — BASIC METABOLIC PANEL
Anion gap: 7 (ref 5–15)
BUN: 10 mg/dL (ref 8–23)
CO2: 24 mmol/L (ref 22–32)
Calcium: 8.3 mg/dL — ABNORMAL LOW (ref 8.9–10.3)
Chloride: 102 mmol/L (ref 98–111)
Creatinine, Ser: 1.19 mg/dL — ABNORMAL HIGH (ref 0.44–1.00)
GFR, Estimated: 45 mL/min — ABNORMAL LOW (ref >60)
Glucose, Bld: 390 mg/dL — ABNORMAL HIGH (ref 70–99)
Potassium: 4.1 mmol/L (ref 3.5–5.1)
Sodium: 133 mmol/L — ABNORMAL LOW (ref 135–145)

## 2022-02-08 LAB — GLUCOSE, CAPILLARY
Glucose-Capillary: 393 mg/dL — ABNORMAL HIGH (ref 70–99)
Glucose-Capillary: 393 mg/dL — ABNORMAL HIGH (ref 70–99)
Glucose-Capillary: 292 mg/dL — ABNORMAL HIGH (ref 70–99)

## 2022-02-08 MED ORDER — LIDOCAINE 5 % EX PTCH
1.0000 | MEDICATED_PATCH | CUTANEOUS | Status: DC
  Administered 2022-02-08: 1 via TRANSDERMAL
  Filled 2022-02-08: qty 1

## 2022-02-08 MED ORDER — LEVOTHYROXINE SODIUM 88 MCG PO TABS: 88.0000 ug | ORAL_TABLET | Freq: Every day | ORAL | 2 refills | Status: DC

## 2022-02-08 MED ORDER — PANTOPRAZOLE SODIUM 40 MG PO TBEC: 40.0000 mg | DELAYED_RELEASE_TABLET | Freq: Every day | ORAL | 1 refills | Status: DC

## 2022-02-08 MED ORDER — POLYETHYLENE GLYCOL 3350 17 G PO PACK
17.0000 g | PACK | Freq: Every day | ORAL | 1 refills | Status: DC
Start: 2022-02-09 — End: 2023-07-27

## 2022-02-08 MED ORDER — INSULIN GLARGINE-YFGN 100 UNIT/ML ~~LOC~~ SOLN
10.0000 [IU] | Freq: Every day | SUBCUTANEOUS | Status: DC
  Administered 2022-02-08: 10 [IU] via SUBCUTANEOUS
  Filled 2022-02-08: qty 0.1

## 2022-02-08 MED ORDER — SENNOSIDES-DOCUSATE SODIUM 8.6-50 MG PO TABS
2.0000 | ORAL_TABLET | Freq: Two times a day (BID) | ORAL | Status: DC
Start: 2022-02-08 — End: 2022-02-08
  Administered 2022-02-08: 2 via ORAL
  Filled 2022-02-08: qty 2

## 2022-02-08 MED ORDER — SENNOSIDES-DOCUSATE SODIUM 8.6-50 MG PO TABS: 2.0000 | ORAL_TABLET | Freq: Every day | ORAL | 2 refills | Status: DC

## 2022-02-08 MED ORDER — APIXABAN 2.5 MG PO TABS: 2.5000 mg | ORAL_TABLET | Freq: Two times a day (BID) | ORAL | 5 refills | Status: DC

## 2022-02-08 NOTE — TOC Transition Note (Signed)
Transition of Care (TOC) - CM/SW Discharge Note ? ? ?Patient Details  ?Name: [name] ?MRN: [id number] ?Date of Birth: 1935/06/29 ? ?Transition of Care (TOC) CM/SW Contact:  ?Tom-Johnson, Renea Ee, RN ?Phone Number: ?02/08/2022, 11:18 AM ? ? ?Clinical Narrative:    ? ?Patient is scheduled for discharge today. Admitted for Acute Anemia. Hgb today is 9.1. Recently discharged from the hospital.  ?Patient states she lives at home alone. Has a daughter and family is supportive. Independent with care and drive self prior to admission. Has a cane and walker at home. Rollator ordered from [employer] per PT's recommendation. ?PCP is  Deland Pretty, MD and uses [facility] on [address]. ?Patient is currently active with [facility] disciplines and Tommi Rumps notified to resume care at discharge with acceptance voiced. Info on AVS. ?Sister to transport at discharge. No further TOC needs noted. ? ? ?Final next level of care: [place] ?Barriers to Discharge: Barriers Resolved ? ? ?Patient Goals and CMS Choice ?Patient states their goals for this hospitalization and ongoing recovery are:: To return home ?CMS Medicare.gov Compare Post Acute Care list provided to:: Patient ?Choice offered to / list presented to : Patient ? ?Discharge Placement ?  ?           ?  ?Patient to be transferred to facility by: Sister ?  ?  ? ?Discharge Plan and Services ?  ?  ?           ?DME Arranged: Walker rolling ?DME Agency: [employer] ?Date DME Agency Contacted: 02/08/22 ?Time DME Agency Contacted: 1110 ?Representative spoke with at DME Agency: Delana Meyer ?HH Arranged: PT, OT, RN, Disease Management (Resume disciplines with [facility] agency.) ?[place] Agency: [place] ?Date HH Agency Contacted: 02/08/22 ?Time [place]: 2637 ?Representative spoke with at [place]: Tommi Rumps ? ?Social Determinants of Health (SDOH) Interventions ?  ? ? ?Readmission Risk Interventions ? ?  01/27/2022  ? 12:37 PM  ?Readmission Risk Prevention Plan   ?Transportation Screening Complete  ?PCP or Specialist Appt within 3-5 Days Complete  ?[place] or Home Care Consult Complete  ?Social Work Consult for [place] Planning/Counseling Complete  ?Palliative Care Screening Not Applicable  ?Medication Review Press photographer) Complete  ? ? ? ? ? ?

## 2022-02-08 NOTE — Plan of Care (Signed)
?  Problem: Clinical Measurements: ?Goal: Respiratory complications will improve ?Outcome: Progressing ?  ?Problem: Clinical Measurements: ?Goal: Cardiovascular complication will be avoided ?Outcome: Progressing ?  ?Problem: Nutrition: ?Goal: Adequate nutrition will be maintained ?Outcome: Progressing ?  ?Problem: Elimination: ?Goal: Will not experience complications related to urinary retention ?Outcome: Progressing ?  ?

## 2022-02-08 NOTE — Discharge Summary (Signed)
[facility]  Physician Discharge Summary   Patient ID: [name] MRN: [id number] DOB/AGE: 85-Dec-1936 85 y.o.  Admit date: 02/05/2022 Discharge date: 02/08/2022    PCP: [name], MD  DISCHARGE DIAGNOSES:  Principal Problem:   Acute anemia Active Problems:   Type 2 diabetes mellitus (HCC)   Essential hypertension   PAF (paroxysmal atrial fibrillation) (HCC)   Hypercholesterolemia   Hypothyroidism   Stage 3b chronic kidney disease (CKD) (HCC)   Occult blood in stools   Rectal ulcer   RECOMMENDATIONS FOR OUTPATIENT FOLLOW UP: Check thyroid function test in a few weeks.  See below Eliquis to be resumed on 5/3  Home Health: PT and OT Equipment/Devices: Rolling walker  CODE STATUS: Full code  DISCHARGE CONDITION: fair  Diet recommendation: As before  INITIAL HISTORY: 87 y/o with h/o hypertension, paroxysmal A-fib, type 2 diabetes, chronic kidney disease presented with concern for acute drop in hemoglobin over the last 2 weeks.  She was recently discharged from the hospital with primary admission diagnoses of A-fib, hypokalemia, hypertension, CKD, and syncope at the time, was discharged just a couple weeks ago.  Found to have a hemoglobin of 9.1.  Was hospitalized for further management.  Stool for occult blood was noted to be positive.    Consultants: Gastroenterology   Procedures:  EGD Impression:               - Normal esophagus.                           - Small hiatal hernia.                           - Normal examined duodenum.                           - No specimens collected.  Colonoscopy Impression:               - Moderate diverticulosis in the left colon.                           - Erythematous mucosa in the mid rectum. Biopsied.                           - A single (solitary) ulcer in the distal rectum.                            Biopsied.                           - The examination was otherwise normal on direct                             and retroflexion views.   HOSPITAL COURSE:   * Acute anemia There was concern for GI loss.  Stool was noted to be heme positive.  No overt bleeding has been noted however.  Hemoglobin was noted to be 12.9 on April 17.  Came in with a hemoglobin of 9.1. Anemia panel showed ferritin of 244, TIBC 248, iron 53.  B12 level 983.  Folate 18.9.   Patient was seen by gastroenterology.  Underwent EGD and colonoscopy.  No  active bleeding was noted.  Some diverticulosis was present.  Solitary ulcer was noted in the rectum. Hemoglobin stable for the last several days.  Okay for discharge today.  Follow-up with PCP.  Labs to be checked periodically.  Consider referral to hematology if patient remains persistently anemic.  Type 2 diabetes mellitus (HCC) Continue home medications.  Essential hypertension Continue home medications  PAF (paroxysmal atrial fibrillation) (HCC) On amiodarone, apixaban and diltiazem prior to admission.  Apixaban on hold due to concern for GI bleed.  Apixaban can be resumed in 3 days.  Hypercholesterolemia Continue atorvastatin.    Hypothyroidism Denies any noncompliance with her levothyroxine.  TSH noted to be significantly elevated at 22.  TSH was elevated at 35 on April 17.  No dose adjustments were made at that time.   Dose of levothyroxine was increased to 88 mcg daily.  Will need to recheck her TSH in a few weeks.    Stage 3b chronic kidney disease (CKD) (HCC) Hypokalemia  Renal function stable.  Potassium has been repleted.   Patient is stable.  Hemoglobin is stable.  Patient reassured.  Okay for discharge home today.  Discussed with the daughter as well.   PERTINENT LABS:  The results of significant diagnostics from this hospitalization (including imaging, microbiology, ancillary and laboratory) are listed below for reference.     Labs:  COVID-19 Labs  Lab Results  Component Value Date   SARSCOV2NAA NEGATIVE 08/31/2021   SARSCOV2NAA POSITIVE (A)  03/13/2021   SARSCOV2NAA NEGATIVE 03/22/2020   SARSCOV2NAA Not Detected 09/13/2019      Basic Metabolic Panel: Recent Labs  Lab 02/05/22 1449 02/05/22 1514 02/06/22 0751 02/07/22 0142 02/08/22 0617  NA 135  --  137 137 133*  K 3.6  --  2.8* 3.1* 4.1  CL 100  --  103 106 102  CO2 27  --  26 23 24   GLUCOSE 425*  --  114* 104* 390*  BUN 16  --  11 7* 10  CREATININE 1.30*  --  1.08* 0.90 1.19*  CALCIUM 8.6*  --  8.2* 8.2* 8.3*  MG  --  2.0  --  2.0  --    Liver Function Tests: Recent Labs  Lab 02/05/22 1449  AST 35  ALT 35  ALKPHOS 53  BILITOT 0.7  PROT 6.5  ALBUMIN 3.7    CBC: Recent Labs  Lab 02/05/22 1449 02/05/22 2324 02/06/22 0751 02/07/22 0142 02/08/22 0617  WBC 6.0  --   --  5.3 5.1  NEUTROABS 4.8  --   --   --   --   HGB 9.1* 9.3* 9.5* 9.5* 9.1*  HCT 28.2* 28.1* 28.9* 28.5* 27.1*  MCV 91.9  --   --  90.5 90.0  PLT 229  --   --  205 191    BNP: BNP (last 3 results) Recent Labs    03/15/21 0745 03/16/21 0039 01/25/22 1327  BNP 182.2* 88.5 555.9*     CBG: Recent Labs  Lab 02/07/22 2001 02/07/22 2356 02/08/22 0431 02/08/22 0740 02/08/22 1219  GLUCAP 341* 256* 393* 393* 292*     IMAGING STUDIES DG Chest Port 1 View  Result Date: 01/25/2022 CLINICAL DATA:  Hyperglycemia. EXAM: PORTABLE CHEST 1 VIEW COMPARISON:  January 01, 2022. FINDINGS: The heart size and mediastinal contours are within normal limits. Both lungs are clear. The visualized skeletal structures are unremarkable. IMPRESSION: No active disease. Electronically Signed   By: Lupita Raider M.D.   On: 01/25/2022  13:10   [place] EKG SITE RITE  Result Date: 01/26/2022 If Site Rite image not attached, placement could not be confirmed due to current cardiac rhythm.   DISCHARGE EXAMINATION: Vitals:   02/07/22 1700 02/07/22 2046 02/08/22 0443 02/08/22 0856  BP: 132/70 (!) 111/50 (!) 119/53 (!) 135/55  Pulse: 70 60 64 67  Resp: 20 (!) 22 20 17   Temp: 97.7 F (36.5 C) 98.3 F (36.8  C) 98.9 F (37.2 C) 98.6 F (37 C)  TempSrc: Oral Oral Oral Oral  SpO2: 99% 100% 98% 97%  Weight:      Height:       General appearance: Awake alert.  In no distress Resp: Clear to auscultation bilaterally.  Normal effort Cardio: S1-S2 is normal regular.  No S3-S4.  No rubs murmurs or bruit GI: Abdomen is soft.  Nontender nondistended.  Bowel sounds are present normal.  No masses organomegaly   DISPOSITION: Home  Discharge Instructions     Call MD for:  difficulty breathing, headache or visual disturbances   Complete by: As directed    Call MD for:  extreme fatigue   Complete by: As directed    Call MD for:  persistant dizziness or light-headedness   Complete by: As directed    Call MD for:  persistant nausea and vomiting   Complete by: As directed    Call MD for:  severe uncontrolled pain   Complete by: As directed    Call MD for:  temperature >100.4   Complete by: As directed    Diet - low sodium heart healthy   Complete by: As directed    Discharge instructions   Complete by: As directed    Please take your medications as prescribed.  Resume Eliquis only on 02/10/2022.  Follow-up with your primary care provider in 1 week or so for repeat blood work.  If you persistently remain anemic you may need to be referred to a blood doctor.  You were cared for by a hospitalist during your hospital stay. If you have any questions about your discharge medications or the care you received while you were in the hospital after you are discharged, you can call the unit and asked to speak with the hospitalist on call if the hospitalist that took care of you is not available. Once you are discharged, your primary care physician will handle any further medical issues. Please note that NO REFILLS for any discharge medications will be authorized once you are discharged, as it is imperative that you return to your primary care physician (or establish a relationship with a primary care physician if you  do not have one) for your aftercare needs so that they can reassess your need for medications and monitor your lab values. If you do not have a primary care physician, you can call [phone] for a physician referral.   Increase activity slowly   Complete by: As directed          Allergies as of 02/08/2022   No Known Allergies      Medication List     STOP taking these medications    rosuvastatin 40 MG tablet Commonly known as: CRESTOR       TAKE these medications    amiodarone 200 MG tablet Commonly known as: PACERONE Take amiodarone 400 mg (two tabs) twice daily for 1 week then take 200 mg (1 tab) twice daily for 1 week then take 200 mg (1 tab) once daily from then on What changed:  how much to take how to take this when to take this additional instructions   apixaban 2.5 MG Tabs tablet Commonly known as: Eliquis Take 1 tablet (2.5 mg total) by mouth 2 (two) times daily. RESUME ON 02/10/2022 Start taking on: Feb 10, 2022 What changed:  how much to take additional instructions   atorvastatin 20 MG tablet Commonly known as: LIPITOR Take 20 mg by mouth daily.   Klor-Con M20 20 MEQ tablet Generic drug: potassium chloride SA Take 1 tablet by mouth daily.   levothyroxine 88 MCG tablet Commonly known as: SYNTHROID Take 1 tablet (88 mcg total) by mouth daily before breakfast. What changed:  medication strength how much to take   NovoLOG FlexPen 100 UNIT/ML FlexPen Generic drug: insulin aspart Inject 8 Units into the skin 3 (three) times daily with meals.   pantoprazole 40 MG tablet Commonly known as: PROTONIX Take 1 tablet (40 mg total) by mouth daily at 6 (six) AM.   polyethylene glycol 17 g packet Commonly known as: MIRALAX / GLYCOLAX Take 17 g by mouth daily.   senna-docusate 8.6-50 MG tablet Commonly known as: Senokot-S Take 2 tablets by mouth at bedtime.   Tiadylt ER 180 MG 24 hr capsule Generic drug: diltiazem TAKE 1 CAPSULE BY MOUTH EVERY  DAY What changed: how much to take   Toujeo Max SoloStar 300 UNIT/ML Solostar Pen Generic drug: insulin glargine (2 Unit Dial) Inject 12 Units into the skin daily.          Follow-up Information     [name], MD. Schedule an appointment as soon as possible for a visit in 1 week(s).   Specialty: Internal Medicine Why: for blood work Contact information: [address] [place] [address] [place] [address] [phone]         Care, [facility] Follow up.   Specialty: Home Health Services Why: Someone wil lcall you to schedule first home visit. Contact information: [address] [place] [address] [phone]                 TOTAL DISCHARGE TIME: 35-minutes  Correne Lalani Rito Ehrlich  [facility] Pager on [email or web address]  02/09/2022, 11:18 AM

## 2022-02-08 NOTE — Care Management Important Message (Signed)
Important Message ? ?Patient Details  ?Name: [name] ?MRN: [id number] ?Date of Birth: Oct 15, 1934 ? ? ?Medicare Important Message Given:  Yes ? ?Patient left prior to IM delivery  ?will mail to  the patient home address.  ? ? ? ?Arietta Eisenstein ?02/08/2022, 3:18 PM ?

## 2022-02-08 NOTE — Care Management Important Message (Signed)
Important Message ? ?Patient Details  ?Name: [name] ?MRN: [id number] ?Date of Birth: 22-Aug-1935 ? ? ?Medicare Important Message Given:  Yes ? ?Patient discharged prior to IM delivery will mail IM to the patient home address.  ? ? ?[name] ?02/08/2022, 1:35 PM ?

## 2022-02-08 NOTE — Progress Notes (Signed)
DISCHARGE NOTE HOME [name] to be discharged Home per MD order. Discussed prescriptions and follow up appointments with the patient. Prescriptions given to patient; medication list explained in detail. Patient verbalized understanding. ? ?Skin clean, dry and intact without evidence of skin break down, no evidence of skin tears noted. IV catheter discontinued intact. Site without signs and symptoms of complications. Dressing and pressure applied. Pt denies pain at the site currently. No complaints noted. ? ?Patient free of lines, drains, and wounds.  ? ?An After Visit Summary (AVS) was printed and given to the patient. ?Patient escorted via wheelchair, and discharged home via private auto. ? ?Berneta Levins, RN  ?

## 2022-02-08 NOTE — Progress Notes (Signed)
Physical Therapy Treatment ?Patient Details ?Name: [name] ?MRN: [id number] ?DOB: November 29, 1934 ?Today's Date: 02/08/2022 ? ? ?History of Present Illness 87 y/o with h/o hypertension, paroxysmal A-fib, type 2 diabetes, chronic kidney disease presented with concern for acute drop in hemoglobin over the last 2 weeks.  She was recently discharged from the hospital with primary admission diagnoses of A-fib, hypokalemia, hypertension, CKD, and syncope at the time, was discharged just a couple weeks ago.  Found to have a hemoglobin of 9.1.  Was hospitalized for further management.  Stool for occult blood was noted to be positive. ? ?  ?PT Comments  ? ? Continuing work on functional mobility and activity tolerance;  Session focused on walkign with RW, and pt managed household distance well; Pt hopes to dc home today; if she must go up and down the stairs, she plans on getting in her car and driving around to the back door, which goes into basement; HHPT can also follow fo rday-to day mobility, safety, and management; PT in agreement with dc home  ?Recommendations for follow up therapy are one component of a multi-disciplinary discharge planning process, led by the attending physician.  Recommendations may be updated based on patient status, additional functional criteria and insurance authorization. ? ?Follow Up Recommendations ? Home health PT ?  ?  ?Assistance Recommended at Discharge PRN  ?Patient can return home with the following Assistance with cooking/housework ?  ?Equipment Recommendations ? Rollator (4 wheels)  ?  ?Recommendations for Other Services Other (comment) (Mobility Team) ? ? ?  ?Precautions / Restrictions Precautions ?Precautions: Other (comment) ?Precaution Comments: incontinent of urine ?Restrictions ?Weight Bearing Restrictions: No  ?  ? ?Mobility ? Bed Mobility ?  ?  ?  ?  ?  ?  ?  ?  ?  ? ?Transfers ?Overall transfer level: Needs assistance ?Equipment used: 1 person hand held assist ?Transfers: Sit  to/from Stand ?Sit to Stand: Min guard ?  ?  ?  ?  ?  ?General transfer comment: Noting decr control of descent to sit ?  ? ?Ambulation/Gait ?Ambulation/Gait assistance: Min guard ?Gait Distance (Feet): 280 Feet ?Assistive device: Rolling walker (2 wheels) ?Gait Pattern/deviations: Step-through pattern ?  ?  ?  ?General Gait Details: Cues for RW proximity and use, especially with turns; adjusted height for proper fit ? ? ?Stairs ?  ?  ?  ?  ?  ? ? ?Wheelchair Mobility ?  ? ?Modified Rankin (Stroke Patients Only) ?  ? ? ?  ?Balance Overall balance assessment: Mild deficits observed, not formally tested ?  ?  ?  ?  ?  ?  ?  ?  ?  ?  ?  ?  ?  ?  ?  ?  ?  ?  ?  ? ?  ?Cognition Arousal/Alertness: Awake/alert ?Behavior During Therapy: [facility] for tasks assessed/performed ?Overall Cognitive Status: Within Functional Limits for tasks assessed ?  ?  ?  ?  ?  ?  ?  ?  ?  ?  ?  ?  ?  ?  ?  ?  ?  ?  ?  ? ?  ?Exercises   ? ?  ?General Comments General comments (skin integrity, edema, etc.): increased risk for incontinence recommending brief. pt states "i dont do this at home as much" ?  ?  ? ?Pertinent Vitals/Pain Pain Assessment ?Pain Assessment: No/denies pain  ? ? ?Home Living Family/patient expects to be discharged to:: Private residence ?Living Arrangements: Alone ?Available  Help at Discharge: Family;Available PRN/intermittently ?Type of Home: House ?Home Access: Stairs to enter ?Entrance Stairs-Rails: None ?Entrance Stairs-Number of Steps: 1 ?  ?Home Layout: Laundry or work area in basement;Able to live on main level with bedroom/bathroom ?Home Equipment: Conservation officer, nature (2 wheels) ?   ?  ?Prior Function    ?  ?  ?   ? ?PT Goals (current goals can now be found in the care plan section) Acute Rehab PT Goals ?Patient Stated Goal: To get home today ?PT Goal Formulation: With patient ?Time For Goal Achievement: 02/14/22 ?Potential to Achieve Goals: Good ?Progress towards PT goals: Progressing toward goals ? ?  ?Frequency ? ? ? Min  3X/week ? ? ? ?  ?PT Plan Current plan remains appropriate  ? ? ?Co-evaluation   ?  ?  ?  ?  ? ?  ?AM-PAC PT "6 Clicks" Mobility   ?Outcome Measure ? Help needed turning from your back to your side while in a flat bed without using bedrails?: None ?Help needed moving from lying on your back to sitting on the side of a flat bed without using bedrails?: None ?Help needed moving to and from a bed to a chair (including a wheelchair)?: None ?Help needed standing up from a chair using your arms (e.g., wheelchair or bedside chair)?: A Little ?Help needed to walk in hospital room?: A Little ?Help needed climbing 3-5 steps with a railing? : A Little ?6 Click Score: 21 ? ?  ?End of Session Equipment Utilized During Treatment: Gait belt ?Activity Tolerance: Patient tolerated treatment well ?Patient left: in chair;with call bell/phone within reach ?Nurse Communication: Mobility status (OK fo rdc home from PT stnadpoint) ?PT Visit Diagnosis: Unsteadiness on feet (R26.81);Other abnormalities of gait and mobility (R26.89) ?  ? ? ?Time: 6144-3154 ?PT Time Calculation (min) (ACUTE ONLY): 21 min ? ?Charges:  $Gait Training: 8-22 mins          ?          ? ?Roney Marion, PT  ?Acute Rehabilitation Services ?Office [phone] ? ? ? ?Colletta [place] ?02/08/2022, 12:01 PM ? ?

## 2022-02-08 NOTE — Evaluation (Signed)
Occupational Therapy Evaluation ?Patient Details ?Name: [name] ?MRN: [id number] ?DOB: [birth year] ?Today's Date: 02/08/2022 ? ? ?History of Present Illness 87 y/o with h/o hypertension, paroxysmal A-fib, type 2 diabetes, chronic kidney disease presented with concern for acute drop in hemoglobin over the last 2 weeks.  She was recently discharged from the hospital with primary admission diagnoses of A-fib, hypokalemia, hypertension, CKD, and syncope at the time, was discharged just a couple weeks ago.  Found to have a hemoglobin of 9.1.  Was hospitalized for further management.  Stool for occult blood was noted to be positive.  ? ?Clinical Impression ?  ?PT admitted with GIB. Pt currently with functional limitiations due to the deficits listed below (see OT problem list). Pt currently with urgency to void bladder and unable to control. Pt reports "this is worse than at home" pt with mesh panty and pad at this time. Recommend use of brief with all mobility.  Pt will benefit from skilled OT to increase their independence and safety with adls and balance to allow discharge [place]. ?  ?   ? ?Recommendations for follow up therapy are one component of a multi-disciplinary discharge planning process, led by the attending physician.  Recommendations may be updated based on patient status, additional functional criteria and insurance authorization.  ? ?Follow Up Recommendations ? Home health OT  ?  ?Assistance Recommended at Discharge PRN  ?Patient can return home with the following A little help with walking and/or transfers;A little help with bathing/dressing/bathroom ? ?  ?Functional Status Assessment ? Patient has had a recent decline in their functional status and demonstrates the ability to make significant improvements in function in a reasonable and predictable amount of time.  ?Equipment Recommendations ? Other (comment) (RW)  ?  ?Recommendations for Other Services   ? ? ?  ?Precautions / Restrictions  Precautions ?Precautions: Other (comment) ?Precaution Comments: incontinent of urine ?Restrictions ?Weight Bearing Restrictions: No  ? ?  ? ?Mobility Bed Mobility ?Overal bed mobility: Independent ?  ?  ?  ?  ?  ?  ?General bed mobility comments: bed flat no rails ?  ? ?Transfers ?Overall transfer level: Needs assistance ?  ?Transfers: Sit to/from Stand ?Sit to Stand: Min assist ?  ?  ?  ?  ?  ?  ?  ? ?  ?Balance Overall balance assessment: Mild deficits observed, not formally tested ?  ?  ?  ?  ?  ?  ?  ?  ?  ?  ?  ?  ?  ?  ?  ?  ?  ?  ?   ? ?ADL either performed or assessed with clinical judgement  ? ?ADL Overall ADL's : Needs assistance/impaired ?Eating/Feeding: Independent ?  ?Grooming: Oral care;Supervision/safety;Standing ?  ?Upper Body Bathing: Supervision/ safety;Sitting ?  ?Lower Body Bathing: Supervison/ safety;Sit to/from stand ?  ?Upper Body Dressing : Supervision/safety;Sitting ?  ?Lower Body Dressing: Supervision/safety;Sitting/lateral leans ?  ?Toilet Transfer: Minimal assistance;Ambulation;Regular Toilet ?  ?  ?  ?  ?  ?Functional mobility during ADLs: Minimal assistance ?General ADL Comments: pt requries hand held (A) for ambulation due to unsteady  ? ? ? ?Vision Baseline Vision/History: 0 No visual deficits ?   ?   ?Perception   ?  ?Praxis   ?  ? ?Pertinent Vitals/Pain Pain Assessment ?Pain Assessment: No/denies pain  ? ? ? ?Hand Dominance Right ?  ?Extremity/Trunk Assessment Upper Extremity Assessment ?Upper Extremity Assessment: Overall WFL for tasks assessed ?  ?Lower Extremity  Assessment ?Lower Extremity Assessment: Defer to PT evaluation ?  ?Cervical / Trunk Assessment ?Cervical / Trunk Assessment: Normal ?  ?Communication Communication ?Communication: No difficulties ?  ?Cognition Arousal/Alertness: Awake/alert ?Behavior During Therapy: [facility] for tasks assessed/performed ?Overall Cognitive Status: Within Functional Limits for tasks assessed ?  ?  ?  ?  ?  ?  ?  ?  ?  ?  ?  ?  ?  ?  ?  ?  ?  ?   ?  ?General Comments  increased risk for incontinence recommending brief. pt states "i dont do this at home as much" ? ?  ?Exercises   ?  ?Shoulder Instructions    ? ? ?Home Living Family/patient expects to be discharged to:: Private residence ?Living Arrangements: Alone ?Available Help at Discharge: Family;Available PRN/intermittently ?Type of Home: House ?Home Access: Stairs to enter ?Entrance Stairs-Number of Steps: 1 ?Entrance Stairs-Rails: None ?Home Layout: Laundry or work area in basement;Able to live on main level with bedroom/bathroom ?  ?  ?Bathroom Shower/Tub: Tub/shower unit ?  ?Bathroom Toilet: Handicapped height ?  ?  ?Home Equipment: Conservation officer, nature (2 wheels) ?  ?  ?  ? ?  ?Prior Functioning/Environment Prior Level of Function : Independent/Modified Independent ?  ?  ?  ?  ?  ?  ?Mobility Comments: Still driving, up until previous admission ?  ?  ? ?  ?  ?OT Problem List: Impaired balance (sitting and/or standing);Decreased activity tolerance;Decreased safety awareness;Decreased knowledge of use of DME or AE;Decreased knowledge of precautions ?  ?   ?OT Treatment/Interventions: Self-care/ADL training;Therapeutic exercise;DME and/or AE instruction;Therapeutic activities;Patient/family education;Balance training  ?  ?OT Goals(Current goals can be found in the care plan section) Acute Rehab OT Goals ?Patient Stated Goal: to get teeth brushed ?OT Goal Formulation: With patient ?Time For Goal Achievement: 02/22/22 ?Potential to Achieve Goals: Good  ?OT Frequency: Min 2X/week ?  ? ?Co-evaluation   ?  ?  ?  ?  ? ?  ?AM-PAC OT "6 Clicks" Daily Activity     ?Outcome Measure Help from another person eating meals?: None ?Help from another person taking care of personal grooming?: None ?Help from another person toileting, which includes using toliet, bedpan, or urinal?: A Little ?Help from another person bathing (including washing, rinsing, drying)?: A Little ?Help from another person to put on and taking off  regular upper body clothing?: None ?Help from another person to put on and taking off regular lower body clothing?: A Little ?6 Click Score: 21 ?  ?End of Session Nurse Communication: Mobility status;Precautions ? ?Activity Tolerance: Patient tolerated treatment well ?Patient left: in chair;with call bell/phone within reach;with chair alarm set ? ?OT Visit Diagnosis: Unsteadiness on feet (R26.81);Muscle weakness (generalized) (M62.81)  ?              ?Time: 3662-9476 ?OT Time Calculation (min): 23 min ?Charges:  OT General Charges ?$OT Visit: 1 Visit ?OT Evaluation ?$OT Eval Moderate Complexity: 1 Mod ? ? ?Brynn, OTR/L  ?Acute Rehabilitation Services ?Office: [phone] ?. ? ? ?Jeri Modena ?02/08/2022, 10:27 AM ?

## 2022-02-09 ENCOUNTER — Encounter: Payer: Self-pay | Admitting: Gastroenterology

## 2022-02-09 LAB — SURGICAL PATHOLOGY

## 2022-02-10 ENCOUNTER — Ambulatory Visit: Payer: Medicare PPO | Admitting: Physician Assistant

## 2022-02-10 DIAGNOSIS — E1322 Other specified diabetes mellitus with diabetic chronic kidney disease: Secondary | ICD-10-CM | POA: Diagnosis not present

## 2022-02-10 DIAGNOSIS — Z794 Long term (current) use of insulin: Secondary | ICD-10-CM | POA: Diagnosis not present

## 2022-02-10 DIAGNOSIS — Z79890 Hormone replacement therapy (postmenopausal): Secondary | ICD-10-CM | POA: Diagnosis not present

## 2022-02-10 DIAGNOSIS — E890 Postprocedural hypothyroidism: Secondary | ICD-10-CM | POA: Diagnosis not present

## 2022-02-10 DIAGNOSIS — Z7985 Long-term (current) use of injectable non-insulin antidiabetic drugs: Secondary | ICD-10-CM | POA: Diagnosis not present

## 2022-02-10 DIAGNOSIS — R809 Proteinuria, unspecified: Secondary | ICD-10-CM | POA: Diagnosis not present

## 2022-02-10 DIAGNOSIS — E1365 Other specified diabetes mellitus with hyperglycemia: Secondary | ICD-10-CM | POA: Diagnosis not present

## 2022-02-10 DIAGNOSIS — N183 Chronic kidney disease, stage 3 unspecified: Secondary | ICD-10-CM | POA: Diagnosis not present

## 2022-02-10 DIAGNOSIS — I480 Paroxysmal atrial fibrillation: Secondary | ICD-10-CM | POA: Diagnosis not present

## 2022-02-10 LAB — GLUCOSE, CAPILLARY: Glucose-Capillary: 36 mg/dL — CL (ref 70–99)

## 2022-02-11 ENCOUNTER — Ambulatory Visit: Payer: Medicare PPO | Admitting: Family Medicine

## 2022-02-15 DIAGNOSIS — I480 Paroxysmal atrial fibrillation: Secondary | ICD-10-CM | POA: Diagnosis not present

## 2022-02-15 DIAGNOSIS — E559 Vitamin D deficiency, unspecified: Secondary | ICD-10-CM | POA: Diagnosis not present

## 2022-02-15 DIAGNOSIS — Z79899 Other long term (current) drug therapy: Secondary | ICD-10-CM | POA: Diagnosis not present

## 2022-02-15 DIAGNOSIS — R5383 Other fatigue: Secondary | ICD-10-CM | POA: Diagnosis not present

## 2022-02-15 DIAGNOSIS — D649 Anemia, unspecified: Secondary | ICD-10-CM | POA: Diagnosis not present

## 2022-02-15 DIAGNOSIS — E039 Hypothyroidism, unspecified: Secondary | ICD-10-CM | POA: Diagnosis not present

## 2022-02-15 DIAGNOSIS — Z09 Encounter for follow-up examination after completed treatment for conditions other than malignant neoplasm: Secondary | ICD-10-CM | POA: Diagnosis not present

## 2022-02-16 ENCOUNTER — Encounter: Payer: Medicare PPO | Admitting: Rehabilitative and Restorative Service Providers"

## 2022-02-17 ENCOUNTER — Telehealth: Payer: Self-pay | Admitting: Internal Medicine

## 2022-02-17 MED ORDER — AMIODARONE HCL 200 MG PO TABS: 200.0000 mg | ORAL_TABLET | Freq: Every day | ORAL | 2 refills | Status: DC

## 2022-02-17 NOTE — Telephone Encounter (Signed)
Rn spoke to patient . Patient states she has completed  the first,two direction  of how to take amiodarone. ? ? She state she is now taking 200 mg Amiodarone daily. ? ?RN informed patient she is to continue taking 200 mg daily. A refill prescription will be e-sent to pharmacy. ? ?Patient is aware has upcoming appointment 02/22/22 with J.Cleaver NP at [facility] office. ? ? Patient verbalized understanding. ?

## 2022-02-17 NOTE — Telephone Encounter (Signed)
New Message: ? ? ? ? ?Patient said she was put on Amiodarone while she was in the hospital. She does have any more. She wants to know if she needs to continue taking this medicine.If so, please call in asap. She is completely out of it. ? ?Pt c/o medication issue: ? ?1. Name of Medication: Amiodarone ? ?2. How are you currently taking this medication (dosage and times per day)?  ? ?3. Are you having a reaction (difficulty breathing--STAT)?  ? ?4. What is your medication issue? Does she need to continue taking this medicine ? ?

## 2022-02-18 ENCOUNTER — Encounter: Payer: Medicare PPO | Admitting: Rehabilitative and Restorative Service Providers"

## 2022-02-20 NOTE — Progress Notes (Signed)
? ?Cardiology Clinic Note  ? ?Patient Name: [name] ?Date of Encounter: 02/22/2022 ? ?Primary Care Provider:  Deland Pretty, MD ?Primary Cardiologist:  Pixie Casino, MD ? ?Patient Profile  ?  ?[name] 87 year old female presents the clinic today for follow-up evaluation of her atrial fibrillation. ? ?Past Medical History  ?  ?Past Medical History:  ?Diagnosis Date  ? Colon polyp   ? Diabetes mellitus   ? Hyperlipidemia   ? Hypertension   ? Poor appetite   ? Thyroid disease   ? ?Past Surgical History:  ?Procedure Laterality Date  ? BIOPSY  02/07/2022  ? Procedure: BIOPSY;  Surgeon: Ladene Artist, MD;  Location: [facility] ENDOSCOPY;  Service: Gastroenterology;;  ? BREAST EXCISIONAL BIOPSY Right   ? CARDIOVASCULAR STRESS TEST  07/15/2006  ? Normal scan, no ECG changes  ? COLONOSCOPY WITH PROPOFOL N/A 02/07/2022  ? Procedure: COLONOSCOPY WITH PROPOFOL;  Surgeon: Ladene Artist, MD;  Location: [place];  Service: Gastroenterology;  Laterality: N/A;  ? ESOPHAGOGASTRODUODENOSCOPY (EGD) WITH PROPOFOL N/A 02/07/2022  ? Procedure: ESOPHAGOGASTRODUODENOSCOPY (EGD) WITH PROPOFOL;  Surgeon: Ladene Artist, MD;  Location: [place];  Service: Gastroenterology;  Laterality: N/A;  ? TRANSTHORACIC ECHOCARDIOGRAM  01/08/2011  ? EF 60-65%, moderae mitral regurg, LA mild-moderately dilated,  ? ? ?Allergies ? ?No Known Allergies ? ?History of Present Illness  ?  ?[name] has a PMH of essential hypertension, paroxysmal atrial fibrillation URI, type 2 diabetes, diabetic ketoacidosis, CKD stage III, hypokalemia, hypercholesterolemia, COVID infection, and acute anemia. ? ?She was seen and evaluated for atrial fibrillation at the request of Dr. Vanita Panda during her hospital admission for syncope and atrial fibrillation with RVR, elevated glucose.  She was continued on her reduced dose of Eliquis.  She converted to sinus rhythm after being placed on IV amiodarone.  She was transitioned to p.o. amiodarone.  Due to low blood  pressures her beta-blocker was held at discharge.  Her AKI was felt to be related to poor p.o. intake. ? ?She presents to the clinic today for follow-up evaluation states she feels well.  She has not noticed any further episodes of atrial fibrillation.  Her blood pressure today is 130/64.  She reports compliance with her apixaban and denies bleeding issues.  She notes that each time she presents to the emergency department her heart rate is irregular and she is dehydrated.  We reviewed triggers for atrial fibrillation.  She expressed understanding.  I will have her increase her physical activity as tolerated, avoid triggers for atrial fibrillation, continue with current medications, and follow-up in 4 months. ? ?Today she denies chest pain, shortness of breath, lower extremity edema, fatigue, palpitations, melena, hematuria, hemoptysis, diaphoresis, weakness, presyncope, syncope, orthopnea, and PND. ? ? ? ? ?Home Medications  ?  ?Prior to Admission medications   ?Medication Sig Start Date End Date Taking? Authorizing Provider  ?amiodarone (PACERONE) 200 MG tablet Take 1 tablet (200 mg total) by mouth daily. Direction change 02/17/22   Hilty, Nadean Corwin, MD  ?apixaban (ELIQUIS) 2.5 MG TABS tablet Take 1 tablet (2.5 mg total) by mouth 2 (two) times daily. RESUME ON 02/10/2022 02/10/22   Bonnielee Haff, MD  ?atorvastatin (LIPITOR) 20 MG tablet Take 20 mg by mouth daily. 02/01/20   [provider]  ?insulin aspart (NOVOLOG FLEXPEN) 100 UNIT/ML FlexPen Inject 8 Units into the skin 3 (three) times daily with meals. 09/04/21   Mariel Aloe, MD  ?insulin glargine, 2 Unit Dial, (TOUJEO MAX SOLOSTAR) 300 UNIT/ML  Solostar Pen Inject 12 Units into the skin daily. 09/04/21   Mariel Aloe, MD  ?KLOR-CON M20 20 MEQ tablet Take 1 tablet by mouth daily. 02/02/22   [provider]  ?levothyroxine (SYNTHROID) 88 MCG tablet Take 1 tablet (88 mcg total) by mouth daily before breakfast. 02/08/22   Bonnielee Haff, MD   ?pantoprazole (PROTONIX) 40 MG tablet Take 1 tablet (40 mg total) by mouth daily at 6 (six) AM. 02/09/22   Bonnielee Haff, MD  ?polyethylene glycol (MIRALAX / GLYCOLAX) 17 g packet Take 17 g by mouth daily. 02/09/22   Bonnielee Haff, MD  ?senna-docusate (SENOKOT-S) 8.6-50 MG tablet Take 2 tablets by mouth at bedtime. 02/08/22   Bonnielee Haff, MD  ?TIADYLT ER 180 MG 24 hr capsule TAKE 1 CAPSULE BY MOUTH EVERY DAY ?Patient taking differently: Take 180 mg by mouth daily. 11/27/21   Hilty, Nadean Corwin, MD  ? ? ?Family History  ?  ?Family History  ?Problem Relation Age of Onset  ? Diabetes Mother   ? Hypertension Father   ? Hyperlipidemia Father   ? Diabetes Sister   ? Hypertension Sister   ? Hyperlipidemia Sister   ? Hypertension Brother   ? Stroke Sister   ? Hypertension Sister   ? Diabetes Sister   ? Cancer Daughter   ? Heart failure Child   ? Breast cancer Neg Hx   ? ?She indicated that her mother is deceased. She indicated that her father is deceased. She indicated that both of her sisters are alive. She indicated that both of her brothers are alive. She indicated that the status of her daughter is unknown. She indicated that the status of her child is unknown. She indicated that the status of her neg hx is unknown. ? ?Social History  ?  ?Social History  ? ?Socioeconomic History  ? Marital status: Widowed  ?  Spouse name: Not on file  ? Number of children: Not on file  ? Years of education: Not on file  ? Highest education level: Not on file  ?Occupational History  ? Not on file  ?Tobacco Use  ? Smoking status: Former  ?  Types: Cigarettes  ?  Quit date: 05/18/2003  ?  Years since quitting: 18.7  ? Smokeless tobacco: Never  ?Vaping Use  ? Vaping Use: Never used  ?Substance and Sexual Activity  ? Alcohol use: No  ? Drug use: No  ? Sexual activity: Not on file  ?Other Topics Concern  ? Not on file  ?Social History Narrative  ? Not on file  ? ?Social Determinants of Health  ? ?Financial Resource Strain: Not on file  ?Food  Insecurity: Not on file  ?Transportation Needs: Not on file  ?Physical Activity: Not on file  ?Stress: Not on file  ?Social Connections: Not on file  ?Intimate Partner Violence: Not on file  ?  ? ?Review of Systems  ?  ?General:  No chills, fever, night sweats or weight changes.  ?Cardiovascular:  No chest pain, dyspnea on exertion, edema, orthopnea, palpitations, paroxysmal nocturnal dyspnea. ?Dermatological: No rash, lesions/masses ?Respiratory: No cough, dyspnea ?Urologic: No hematuria, dysuria ?Abdominal:   No nausea, vomiting, diarrhea, bright red blood per rectum, melena, or hematemesis ?Neurologic:  No visual changes, wkns, changes in mental status. ?All other systems reviewed and are otherwise negative except as noted above. ? ?Physical Exam  ?  ?VS:  BP 130/64   Pulse 69   Ht '5\' 2"'$  (1.575 m)   Wt 134  lb 12.8 oz (61.1 kg)   SpO2 98%   BMI 24.66 kg/m?  , BMI Body mass index is 24.66 kg/m?. ?GEN: Well nourished, well developed, in no acute distress. ?HEENT: normal. ?Neck: Supple, no JVD, carotid bruits, or masses. ?Cardiac: RRR, no murmurs, rubs, or gallops. No clubbing, cyanosis, edema.  Radials/DP/PT 2+ and equal bilaterally.  ?Respiratory:  Respirations regular and unlabored, clear to auscultation bilaterally. ?GI: Soft, nontender, nondistended, BS + x 4. ?MS: no deformity or atrophy. ?Skin: warm and dry, no rash. ?Neuro:  Strength and sensation are intact. ?Psych: Normal affect. ? ?Accessory Clinical Findings  ?  ?Recent Labs: ?01/25/2022: B Natriuretic Peptide 555.9 ?02/05/2022: ALT 35; TSH 22.015 ?02/07/2022: Magnesium 2.0 ?02/08/2022: BUN 10; Creatinine, Ser 1.19; Hemoglobin 9.1; Platelets 191; Potassium 4.1; Sodium 133  ? ?Recent Lipid Panel ?   ?Component Value Date/Time  ? CHOL  01/09/2011 0430  ?  88        ?ATP III CLASSIFICATION: ? <200     mg/dL   Desirable ? 200-239  mg/dL   Borderline High ? >=240    mg/dL   High ?        ? TRIG 101 01/09/2011 0430  ? HDL 36 (L) 01/09/2011 0430  ? CHOLHDL 2.4  01/09/2011 0430  ? VLDL 20 01/09/2011 0430  ? [place]  01/09/2011 0430  ?  32        ?Total Cholesterol/HDL:CHD Risk ?Coronary Heart Disease Risk Table ?                    Men   Women ? 1/2 Average Risk   3.4   3

## 2022-02-22 ENCOUNTER — Ambulatory Visit (INDEPENDENT_AMBULATORY_CARE_PROVIDER_SITE_OTHER): Payer: Medicare PPO | Admitting: General Practice

## 2022-02-22 ENCOUNTER — Encounter: Payer: Self-pay | Admitting: General Practice

## 2022-02-22 ENCOUNTER — Telehealth: Payer: Self-pay | Admitting: Internal Medicine

## 2022-02-22 ENCOUNTER — Other Ambulatory Visit: Payer: Self-pay

## 2022-02-22 VITALS — BP 130/64 | HR 69 | Ht 62.0 in | Wt 134.8 lb

## 2022-02-22 DIAGNOSIS — E039 Hypothyroidism, unspecified: Secondary | ICD-10-CM | POA: Diagnosis not present

## 2022-02-22 DIAGNOSIS — I480 Paroxysmal atrial fibrillation: Secondary | ICD-10-CM

## 2022-02-22 DIAGNOSIS — E118 Type 2 diabetes mellitus with unspecified complications: Secondary | ICD-10-CM

## 2022-02-22 DIAGNOSIS — E782 Mixed hyperlipidemia: Secondary | ICD-10-CM | POA: Diagnosis not present

## 2022-02-22 DIAGNOSIS — I10 Essential (primary) hypertension: Secondary | ICD-10-CM

## 2022-02-22 DIAGNOSIS — Z794 Long term (current) use of insulin: Secondary | ICD-10-CM

## 2022-02-22 MED ORDER — APIXABAN 5 MG PO TABS: ORAL_TABLET | ORAL | 3 refills | Status: DC

## 2022-02-22 MED ORDER — APIXABAN 2.5 MG PO TABS: ORAL_TABLET | ORAL | 5 refills | Status: DC

## 2022-02-22 NOTE — Patient Instructions (Addendum)
Medication Instructions:  ?INCREASE ELIQUIS '5MG'$  TWICE DAILY (MAY TAKE 2 TWICE DAILY UNTIL 2.'5MG'$  ARE GONE) ? ?*If you need a refill on your cardiac medications before your next appointment, please call your pharmacy* ? ?Lab Work:   Testing/Procedures:  ?NONE    NONE ? ?If you have labs (blood work) drawn today and your tests are completely normal, you will receive your results only by: ?MyChart Message (if you have MyChart) OR  A paper copy in the mail ?If you have any lab test that is abnormal or we need to change your treatment, we will call you to review the results. ? ?Special Instructions ?Please try to avoid these triggers: ?Do not use any products that have nicotine or tobacco in them. These include cigarettes, e-cigarettes, and chewing tobacco. If you need help quitting, ask your doctor. ?Eat heart-healthy foods. Talk with your doctor about the right eating plan for you. ?Exercise regularly as told by your doctor. ?Stay hydrated ?Do not drink alcohol, Caffeine or chocolate. ?Lose weight if you are overweight. ?Do not use drugs, including cannabis ?  ?Follow-Up: ?Your next appointment:  4 month(s) In Person with Pixie Casino, MD   ? ?At [facility], you and your health needs are our priority.  As part of our continuing mission to provide you with exceptional heart care, we have created designated Provider Care Teams.  These Care Teams include your primary Cardiologist (physician) and Advanced Practice Providers (APPs -  Physician Assistants and Nurse Practitioners) who all work together to provide you with the care you need, when you need it. ? ? ?Important Information About Sugar ? ? ? ? ? ? ?       ? ?

## 2022-02-22 NOTE — Addendum Note (Signed)
Addended by: Deanna Artis A on: 02/22/2022 02:43 PM ? ? Modules accepted: Orders ? ?

## 2022-02-22 NOTE — Telephone Encounter (Signed)
?*  STAT* If patient is at the pharmacy, call can be transferred to refill team. ? ? ?1. Which medications need to be refilled? (please list name of each medication and dose if known)  ? apixaban (ELIQUIS) 2.5 MG TABS tablet  ? ? ?2. Which pharmacy/location (including street and city if local pharmacy) is medication to be sent to? ?[id number]- [place], [place] - [address]? ?3. Do they need a 30 day or 90 day supply?  ?30 day ? ?

## 2022-02-22 NOTE — Telephone Encounter (Signed)
Prescription refill request for Eliquis received. ?Indication:Afib ?Last office visit:12/22 ?Scr:1.1 ?Age: 87 ?Weight:65.9 ? ?Prescription refilled ? ?

## 2022-02-23 ENCOUNTER — Encounter: Payer: Medicare PPO | Admitting: Rehabilitative and Restorative Service Providers"

## 2022-02-23 DIAGNOSIS — H52203 Unspecified astigmatism, bilateral: Secondary | ICD-10-CM | POA: Diagnosis not present

## 2022-02-23 DIAGNOSIS — E119 Type 2 diabetes mellitus without complications: Secondary | ICD-10-CM | POA: Diagnosis not present

## 2022-02-23 DIAGNOSIS — Z961 Presence of intraocular lens: Secondary | ICD-10-CM | POA: Diagnosis not present

## 2022-02-23 DIAGNOSIS — H40013 Open angle with borderline findings, low risk, bilateral: Secondary | ICD-10-CM | POA: Diagnosis not present

## 2022-02-23 NOTE — Addendum Note (Signed)
Addended by: Waylan Rocher on: 02/23/2022 11:49 AM ? ? Modules accepted: Orders ? ?

## 2022-02-25 ENCOUNTER — Encounter: Payer: Medicare PPO | Admitting: Rehabilitative and Restorative Service Providers"

## 2022-02-26 DIAGNOSIS — I480 Paroxysmal atrial fibrillation: Secondary | ICD-10-CM | POA: Diagnosis not present

## 2022-02-26 DIAGNOSIS — E1022 Type 1 diabetes mellitus with diabetic chronic kidney disease: Secondary | ICD-10-CM | POA: Diagnosis not present

## 2022-02-26 DIAGNOSIS — I129 Hypertensive chronic kidney disease with stage 1 through stage 4 chronic kidney disease, or unspecified chronic kidney disease: Secondary | ICD-10-CM | POA: Diagnosis not present

## 2022-02-26 DIAGNOSIS — N1832 Chronic kidney disease, stage 3b: Secondary | ICD-10-CM | POA: Diagnosis not present

## 2022-03-02 ENCOUNTER — Encounter: Payer: Medicare PPO | Admitting: Rehabilitative and Restorative Service Providers"

## 2022-03-02 ENCOUNTER — Telehealth: Payer: Self-pay

## 2022-03-02 NOTE — Telephone Encounter (Signed)
tried to call [facility] number-just keeps ringing  Left message-received fax from [facility] stating that the called and reconciled pt's medication list. There are a few differences. Per [employer]'s list levothyroxine is 55mg not 75,  rosuvastatin '40mg'$  not atorva 20 mg and    trulicity 03.55mDD/2.2ml SQ pen, which is not on our list. Will call again later/pt call back.

## 2022-03-09 NOTE — Telephone Encounter (Signed)
[id number] 

## 2022-03-09 NOTE — Telephone Encounter (Signed)
Tried to call pt hm and cell number there is no voicemail. Need to discuss the medication list, see message from [place].

## 2022-03-11 NOTE — Telephone Encounter (Signed)
Pt has not called back, will await future call/appt

## 2022-03-24 DIAGNOSIS — I10 Essential (primary) hypertension: Secondary | ICD-10-CM | POA: Diagnosis not present

## 2022-03-24 DIAGNOSIS — E785 Hyperlipidemia, unspecified: Secondary | ICD-10-CM | POA: Diagnosis not present

## 2022-03-24 DIAGNOSIS — E039 Hypothyroidism, unspecified: Secondary | ICD-10-CM | POA: Diagnosis not present

## 2022-03-24 DIAGNOSIS — M85851 Other specified disorders of bone density and structure, right thigh: Secondary | ICD-10-CM | POA: Diagnosis not present

## 2022-03-25 ENCOUNTER — Emergency Department (HOSPITAL_BASED_OUTPATIENT_CLINIC_OR_DEPARTMENT_OTHER): Payer: Medicare PPO

## 2022-03-25 ENCOUNTER — Encounter (HOSPITAL_BASED_OUTPATIENT_CLINIC_OR_DEPARTMENT_OTHER): Payer: Self-pay

## 2022-03-25 ENCOUNTER — Other Ambulatory Visit: Payer: Self-pay

## 2022-03-25 ENCOUNTER — Emergency Department (HOSPITAL_BASED_OUTPATIENT_CLINIC_OR_DEPARTMENT_OTHER)
Admission: EM | Admit: 2022-03-25 | Discharge: 2022-03-26 | Disposition: A | Discharge status: Home / Self Care | Payer: Medicare PPO | Attending: Emergency Medicine | Admitting: Emergency Medicine

## 2022-03-25 DIAGNOSIS — E119 Type 2 diabetes mellitus without complications: Secondary | ICD-10-CM | POA: Insufficient documentation

## 2022-03-25 DIAGNOSIS — R7989 Other specified abnormal findings of blood chemistry: Secondary | ICD-10-CM | POA: Diagnosis not present

## 2022-03-25 DIAGNOSIS — Z87891 Personal history of nicotine dependence: Secondary | ICD-10-CM | POA: Diagnosis not present

## 2022-03-25 DIAGNOSIS — Z7901 Long term (current) use of anticoagulants: Secondary | ICD-10-CM | POA: Diagnosis not present

## 2022-03-25 DIAGNOSIS — Z794 Long term (current) use of insulin: Secondary | ICD-10-CM | POA: Diagnosis not present

## 2022-03-25 DIAGNOSIS — E860 Dehydration: Secondary | ICD-10-CM | POA: Insufficient documentation

## 2022-03-25 DIAGNOSIS — R5383 Other fatigue: Secondary | ICD-10-CM | POA: Diagnosis not present

## 2022-03-25 DIAGNOSIS — I10 Essential (primary) hypertension: Secondary | ICD-10-CM | POA: Insufficient documentation

## 2022-03-25 DIAGNOSIS — R531 Weakness: Secondary | ICD-10-CM | POA: Diagnosis not present

## 2022-03-25 LAB — PROTIME-INR
INR: 1.1 (ref 0.8–1.2)
Prothrombin Time: 13.7 seconds (ref 11.4–15.2)

## 2022-03-25 LAB — TROPONIN I (HIGH SENSITIVITY)
Troponin I (High Sensitivity): 9 ng/L (ref <18)
Troponin I (High Sensitivity): 10 ng/L (ref <18)

## 2022-03-25 LAB — CBC
HCT: 32.4 % — ABNORMAL LOW (ref 36.0–46.0)
Hemoglobin: 10.8 g/dL — ABNORMAL LOW (ref 12.0–15.0)
MCH: 29.3 pg (ref 26.0–34.0)
MCHC: 33.3 g/dL (ref 30.0–36.0)
MCV: 87.8 fL (ref 80.0–100.0)
Platelets: 241 10*3/uL (ref 150–400)
RBC: 3.69 MIL/uL — ABNORMAL LOW (ref 3.87–5.11)
RDW: 13.2 % (ref 11.5–15.5)
WBC: 6.8 10*3/uL (ref 4.0–10.5)
nRBC: 0 % (ref 0.0–0.2)

## 2022-03-25 LAB — BASIC METABOLIC PANEL
Anion gap: 8 (ref 5–15)
BUN: 23 mg/dL (ref 8–23)
CO2: 25 mmol/L (ref 22–32)
Calcium: 8.7 mg/dL — ABNORMAL LOW (ref 8.9–10.3)
Chloride: 99 mmol/L (ref 98–111)
Creatinine, Ser: 1.39 mg/dL — ABNORMAL HIGH (ref 0.44–1.00)
GFR, Estimated: 37 mL/min — ABNORMAL LOW (ref >60)
Glucose, Bld: 285 mg/dL — ABNORMAL HIGH (ref 70–99)
Potassium: 3.7 mmol/L (ref 3.5–5.1)
Sodium: 132 mmol/L — ABNORMAL LOW (ref 135–145)

## 2022-03-25 MED ORDER — LACTATED RINGERS IV BOLUS: 1000.0000 mL | Freq: Once | INTRAVENOUS | Status: DC

## 2022-03-25 MED ORDER — FAMOTIDINE IN NACL 20-0.9 MG/50ML-% IV SOLN
20.0000 mg | Freq: Once | INTRAVENOUS | Status: AC
  Administered 2022-03-25: 20 mg via INTRAVENOUS
  Filled 2022-03-25: qty 50

## 2022-03-25 MED ORDER — LIDOCAINE VISCOUS HCL 2 % MT SOLN
15.0000 mL | Freq: Once | OROMUCOSAL | Status: AC
  Administered 2022-03-25: 15 mL via ORAL
  Filled 2022-03-25: qty 15

## 2022-03-25 MED ORDER — ALUM & MAG HYDROXIDE-SIMETH 200-200-20 MG/5ML PO SUSP
30.0000 mL | Freq: Once | ORAL | Status: AC
  Administered 2022-03-25: 30 mL via ORAL
  Filled 2022-03-25: qty 30

## 2022-03-25 NOTE — Discharge Instructions (Signed)
It was a pleasure caring for you today in the emergency department. ° °Please return to the emergency department for any worsening or worrisome symptoms. ° ° °

## 2022-03-25 NOTE — ED Triage Notes (Signed)
Pt to ED by POV from home with c/o lethargy which began over a month ago. Pt had two recent hospitalizations in the past month and reports decreased energy levels since then. Pt states she is here to have her "thyroid checked". Denies any changes in appetite, however does report orthostatic hypotension symptoms. Arrives A+O, VSS, NADN.

## 2022-03-25 NOTE — ED Provider Notes (Signed)
[facility] EMERGENCY DEPARTMENT Provider Note   CSN: [id number] Arrival date & time: 03/25/22  1910     History  Chief Complaint  Patient presents with   Fatigue    [name] is a 87 y.o. female.  Patient as above with significant medical history as below, including DM, HLD, HTN, thyroid disease who presents to the ED with complaint of fatigue.  Patient accompanied by daughter.  Reports patient has been feeling tired over the past 2 months.  She has had reduced appetite, generalized fatigue/malaise.  No medication changes.  She is tolerant p.o. intake without difficulty, no nausea or vomiting, no chest pain, numbness or tingling, no falls or head injuries.  She has been compliant with her medications.  No change to bowel or bladder function.  No alcohol use.  Daughter reports patient has lost around 10 pounds unintentionally over the past 2 months.  Daughter is concerned that perhaps her thyroid levels need to be checked.  No melena or BRBPR.  No recent travel or sick contacts patient's complaints are very vague      Past Medical History:  Diagnosis Date   Colon polyp    Diabetes mellitus    Hyperlipidemia    Hypertension    Poor appetite    Thyroid disease     Past Surgical History:  Procedure Laterality Date   BIOPSY  02/07/2022   Procedure: BIOPSY;  Surgeon: Ladene Artist, MD;  Location: [facility] ENDOSCOPY;  Service: Gastroenterology;;   BREAST EXCISIONAL BIOPSY Right    CARDIOVASCULAR STRESS TEST  07/15/2006   Normal scan, no ECG changes   COLONOSCOPY WITH PROPOFOL N/A 02/07/2022   Procedure: COLONOSCOPY WITH PROPOFOL;  Surgeon: Ladene Artist, MD;  Location: [facility] ENDOSCOPY;  Service: Gastroenterology;  Laterality: N/A;   ESOPHAGOGASTRODUODENOSCOPY (EGD) WITH PROPOFOL N/A 02/07/2022   Procedure: ESOPHAGOGASTRODUODENOSCOPY (EGD) WITH PROPOFOL;  Surgeon: Ladene Artist, MD;  Location: [place];  Service: Gastroenterology;  Laterality: N/A;   TRANSTHORACIC  ECHOCARDIOGRAM  01/08/2011   EF 60-65%, moderae mitral regurg, LA mild-moderately dilated,     The history is provided by the patient and a relative. No language interpreter was used.       Home Medications Prior to Admission medications   Medication Sig Start Date End Date Taking? Authorizing Provider  amiodarone (PACERONE) 200 MG tablet Take 1 tablet (200 mg total) by mouth daily. Direction change 02/17/22   Hilty, Nadean Corwin, MD  apixaban (ELIQUIS) 5 MG TABS tablet Take 1 tablet (5 mg) twice Daily 02/22/22   Deberah Pelton, NP  atorvastatin (LIPITOR) 20 MG tablet Take 20 mg by mouth daily. 02/01/20   [provider]  insulin aspart (NOVOLOG FLEXPEN) 100 UNIT/ML FlexPen Inject 8 Units into the skin 3 (three) times daily with meals. 09/04/21   Mariel Aloe, MD  insulin glargine, 2 Unit Dial, (TOUJEO MAX SOLOSTAR) 300 UNIT/ML Solostar Pen Inject 12 Units into the skin daily. 09/04/21   Mariel Aloe, MD  KLOR-CON M20 20 MEQ tablet Take 1 tablet by mouth daily. 02/02/22   [provider]  levothyroxine (SYNTHROID) 88 MCG tablet Take 1 tablet (88 mcg total) by mouth daily before breakfast. 02/08/22   Bonnielee Haff, MD  pantoprazole (PROTONIX) 40 MG tablet Take 1 tablet (40 mg total) by mouth daily at 6 (six) AM. 02/09/22   Bonnielee Haff, MD  polyethylene glycol (MIRALAX / GLYCOLAX) 17 g packet Take 17 g by mouth daily. 02/09/22   Bonnielee Haff, MD  senna-docusate (SENOKOT-S) 8.6-50 MG tablet Take 2 tablets by mouth at bedtime. 02/08/22   Bonnielee Haff, MD  TIADYLT ER 180 MG 24 hr capsule TAKE 1 CAPSULE BY MOUTH EVERY DAY Patient taking differently: Take 180 mg by mouth daily. 11/27/21   Hilty, Nadean Corwin, MD      Allergies    Patient has no known allergies.    Review of Systems   Review of Systems  Constitutional:  Positive for activity change, appetite change, fatigue and unexpected weight change. Negative for chills and fever.  HENT:  Negative for facial swelling and  trouble swallowing.   Eyes:  Negative for photophobia and visual disturbance.  Respiratory:  Negative for cough and shortness of breath.   Cardiovascular:  Negative for chest pain and palpitations.  Gastrointestinal:  Negative for abdominal pain, nausea and vomiting.  Endocrine: Negative for polydipsia and polyuria.  Genitourinary:  Negative for difficulty urinating and hematuria.  Musculoskeletal:  Negative for gait problem and joint swelling.  Skin:  Negative for pallor and rash.  Neurological:  Negative for syncope and headaches.  Psychiatric/Behavioral:  Negative for agitation and confusion.     Physical Exam Updated Vital Signs BP (!) 158/51   Pulse 68   Temp 98.3 F (36.8 C) (Oral)   Resp 18   Ht '5\' 2"'$  (1.575 m)   Wt 55.8 kg   SpO2 100%   BMI 22.50 kg/m  Physical Exam Vitals and nursing note reviewed.  Constitutional:      General: She is not in acute distress.    Appearance: Normal appearance. She is not ill-appearing or diaphoretic.  HENT:     Head: Normocephalic and atraumatic.     Comments: No thyromegaly    Right Ear: External ear normal.     Left Ear: External ear normal.     Nose: Nose normal.     Mouth/Throat:     Mouth: Mucous membranes are moist.  Eyes:     General: No scleral icterus.       Right eye: No discharge.        Left eye: No discharge.  Cardiovascular:     Rate and Rhythm: Normal rate and regular rhythm.     Pulses: Normal pulses.     Heart sounds: Normal heart sounds.  Pulmonary:     Effort: Pulmonary effort is normal. No respiratory distress.     Breath sounds: Normal breath sounds.  Abdominal:     General: Abdomen is flat.     Palpations: Abdomen is soft.     Tenderness: There is no abdominal tenderness. There is no guarding or rebound.  Musculoskeletal:        General: Normal range of motion.     Cervical back: Normal range of motion.     Right lower leg: No edema.     Left lower leg: No edema.  Skin:    General: Skin is warm  and dry.     Capillary Refill: Capillary refill takes less than 2 seconds.  Neurological:     Mental Status: She is alert and oriented to person, place, and time.     GCS: GCS eye subscore is 4. GCS verbal subscore is 5. GCS motor subscore is 6.     Cranial Nerves: Cranial nerves 2-12 are intact. No dysarthria or facial asymmetry.     Sensory: Sensation is intact.     Motor: Motor function is intact. No tremor.     Coordination: Coordination is intact.  Gait: Gait is intact.  Psychiatric:        Attention and Perception: Attention and perception normal.        Mood and Affect: Mood normal.        Behavior: Behavior normal.     ED Results / Procedures / Treatments   Labs (all labs ordered are listed, but only abnormal results are displayed) Labs Reviewed  BASIC METABOLIC PANEL - Abnormal; Notable for the following components:      Result Value   Sodium 132 (*)    Glucose, Bld 285 (*)    Creatinine, Ser 1.39 (*)    Calcium 8.7 (*)    GFR, Estimated 37 (*)    All other components within normal limits  CBC - Abnormal; Notable for the following components:   RBC 3.69 (*)    Hemoglobin 10.8 (*)    HCT 32.4 (*)    All other components within normal limits  PROTIME-INR  TSH  TROPONIN I (HIGH SENSITIVITY)  TROPONIN I (HIGH SENSITIVITY)    EKG None  Radiology DG Chest 2 View  Result Date: 03/25/2022 CLINICAL DATA:  Weakness. EXAM: CHEST - 2 VIEW COMPARISON:  Chest x-ray 12/31/2021. FINDINGS: The heart size and mediastinal contours are within normal limits. Calcified nodular density is seen in the right upper lobe. The lungs are otherwise clear. There is no pleural effusion or pneumothorax. There are surgical clips in the upper abdomen. The visualized skeletal structures are unremarkable. IMPRESSION: No active cardiopulmonary disease. Electronically Signed   By: Ronney Asters M.D.   On: 03/25/2022 21:15    Procedures Procedures    Medications Ordered in ED Medications   famotidine (PEPCID) IVPB 20 mg premix (0 mg Intravenous Stopped 03/25/22 2320)  alum & mag hydroxide-simeth (MAALOX/MYLANTA) 200-200-20 MG/5ML suspension 30 mL (30 mLs Oral Given 03/25/22 2248)    And  lidocaine (XYLOCAINE) 2 % viscous mouth solution 15 mL (15 mLs Oral Given 03/25/22 2249)    ED Course/ Medical Decision Making/ A&P                           Medical Decision Making Amount and/or Complexity of Data Reviewed Labs: ordered. Radiology: ordered.  Risk OTC drugs. Prescription drug management.    CC: Fatigue  This patient presents to the Emergency Department for the above complaint. This involves an extensive number of treatment options and is a complaint that carries with it a high risk of complications and morbidity. Vital signs were reviewed. Serious etiologies considered.  Patient's complaints are vague ; DDx is broad and includes but was not limited to hypothyroidism, ACS, infectious, metabolic, endocrine, dehydration, electrolyte disturbance, uti, deconditioning, malignancy, other acute etiologies were considered.  Record review:  Previous records obtained and reviewed  Prior office visits, prior admissions, prior labs and imaging Echo 11/22 reviewed, EF 65 to 70%, indeterminate diastolic parameters, no clear diastolic dysfunction  Additional history obtained from family at bedside  Medical and surgical history as noted above.   Work up as above, notable for:  Labs & imaging results that were available during my care of the patient were visualized by me and considered in my medical decision making.   I ordered imaging studies which included chest x-ray. I visualized the imaging, interpreted images, and I agree with radiologist interpretation.  No acute process  Cardiac monitoring reviewed and interpreted personally which shows NSR.  ECG without evidence of acute ischemia.  Labs reviewed, her creatinine is  mildly elevated, she has variable creatinine in the past  and this is not very far from her baseline.  Hemoglobin is stable.  Troponin negative x2.    Glucose is elevated, no DKA, no anion gap.  History of diabetes mellitus.  Recommend she follow-up with her PCP regarding hyperglycemia   Management: Patient given GI cocktail, symptoms resolved. Also given IV fluids  Reassessment:  Symptoms have improved  Admission was considered.   Patient has no chest pain.  ACS work-up negative at this point.  Suspicion for ACS is very low.  Patient with mild dehydration, creatinine mildly increased from prior.  Give IV fluids.  Patient is on p.o. intake.  Blood pressure stable.  Plan discharge patient once IV fluids are completed.  Discussed with family at length with patient.  Agreeable to plan for outpatient follow-up.  Will return if symptoms change or worsen.  TSh pending at time of discharge, she will follow-up with her PCP regarding this.  The patient improved significantly and was discharged in stable condition. Detailed discussions were had with the patient regarding current findings, and need for close f/u with PCP or on call doctor. The patient has been instructed to return immediately if the symptoms worsen in any way for re-evaluation. Patient verbalized understanding and is in agreement with current care plan. All questions answered prior to discharge.            Social determinants of health include -  Social History   Socioeconomic History   Marital status: Widowed    Spouse name: Not on file   Number of children: Not on file   Years of education: Not on file   Highest education level: Not on file  Occupational History   Not on file  Tobacco Use   Smoking status: Former    Types: Cigarettes    Quit date: 05/18/2003    Years since quitting: 18.8   Smokeless tobacco: Never  Vaping Use   Vaping Use: Never used  Substance and Sexual Activity   Alcohol use: No   Drug use: No   Sexual activity: Not on file  Other Topics  Concern   Not on file  Social History Narrative   Not on file   Social Determinants of Health   Financial Resource Strain: Not on file  Food Insecurity: Not on file  Transportation Needs: Not on file  Physical Activity: Not on file  Stress: Not on file  Social Connections: Not on file  Intimate Partner Violence: Not on file      This chart was dictated using voice recognition software.  Despite best efforts to proofread,  errors can occur which can change the documentation meaning.         Final Clinical Impression(s) / ED Diagnoses Final diagnoses:  Mild dehydration  Other fatigue    Rx / DC Orders ED Discharge Orders     None         Jeanell Sparrow, DO 03/26/22 1644

## 2022-03-26 LAB — TSH: TSH: 2.902 u[IU]/mL (ref 0.350–4.500)

## 2022-03-26 NOTE — ED Provider Notes (Signed)
  Physical Exam  BP (!) 158/51   Pulse 68   Temp 98.3 F (36.8 C) (Oral)   Resp 18   Ht '5\' 2"'$  (1.575 m)   Wt 55.8 kg   SpO2 100%   BMI 22.50 kg/m   Physical Exam Vitals and nursing note reviewed.  Constitutional:      General: She is not in acute distress.    Appearance: She is well-developed. She is not diaphoretic.  HENT:     Head: Normocephalic and atraumatic.  Cardiovascular:     Rate and Rhythm: Normal rate and regular rhythm.     Heart sounds: No murmur heard.    No friction rub. No gallop.  Pulmonary:     Effort: Pulmonary effort is normal. No respiratory distress.     Breath sounds: Normal breath sounds. No wheezing.  Abdominal:     General: Bowel sounds are normal. There is no distension.     Palpations: Abdomen is soft.     Tenderness: There is no abdominal tenderness.  Musculoskeletal:        General: Normal range of motion.     Cervical back: Normal range of motion and neck supple.  Skin:    General: Skin is warm and dry.  Neurological:     General: No focal deficit present.     Mental Status: She is alert and oriented to person, place, and time.     Procedures  Procedures  ED Course / MDM    Care assumed from Dr. Pearline Cables at shift change.  Patient awaiting IV hydration.  IV access difficult to obtain, however patient seems to be tolerating oral liquids without difficulty.  I feel as though she can safely be discharged with oral hydration and as needed return.  Her creatinine is 1.3 which is consistent with baseline studies.  Clinically she appears well with stable vital signs.       Veryl Speak, MD 03/26/22 [phone]

## 2022-03-31 DIAGNOSIS — R5381 Other malaise: Secondary | ICD-10-CM | POA: Diagnosis not present

## 2022-03-31 DIAGNOSIS — E039 Hypothyroidism, unspecified: Secondary | ICD-10-CM | POA: Diagnosis not present

## 2022-03-31 DIAGNOSIS — D649 Anemia, unspecified: Secondary | ICD-10-CM | POA: Diagnosis not present

## 2022-03-31 DIAGNOSIS — E876 Hypokalemia: Secondary | ICD-10-CM | POA: Diagnosis not present

## 2022-03-31 DIAGNOSIS — R351 Nocturia: Secondary | ICD-10-CM | POA: Diagnosis not present

## 2022-03-31 DIAGNOSIS — N1831 Chronic kidney disease, stage 3a: Secondary | ICD-10-CM | POA: Diagnosis not present

## 2022-04-01 ENCOUNTER — Telehealth: Payer: Self-pay | Admitting: Physician Assistant

## 2022-04-01 NOTE — Telephone Encounter (Signed)
Scheduled appt per 6/22 referral. Pt is aware of appt date and time. Pt is aware to arrive 15 mins prior to appt time and to bring and updated insurance card. Pt is aware of appt location.

## 2022-04-02 ENCOUNTER — Other Ambulatory Visit: Payer: Self-pay | Admitting: Physician Assistant

## 2022-04-02 DIAGNOSIS — E1065 Type 1 diabetes mellitus with hyperglycemia: Secondary | ICD-10-CM | POA: Diagnosis not present

## 2022-04-02 DIAGNOSIS — Z794 Long term (current) use of insulin: Secondary | ICD-10-CM | POA: Diagnosis not present

## 2022-04-02 DIAGNOSIS — Z7985 Long-term (current) use of injectable non-insulin antidiabetic drugs: Secondary | ICD-10-CM | POA: Diagnosis not present

## 2022-04-02 DIAGNOSIS — D649 Anemia, unspecified: Secondary | ICD-10-CM

## 2022-04-05 ENCOUNTER — Inpatient Hospital Stay: Payer: Medicare PPO

## 2022-04-05 ENCOUNTER — Telehealth: Payer: Self-pay

## 2022-04-05 ENCOUNTER — Inpatient Hospital Stay: Payer: Medicare PPO | Attending: Physician Assistant | Admitting: Physician Assistant

## 2022-04-05 VITALS — BP 130/59 | HR 79 | Temp 98.0°F | Resp 16 | Ht 62.0 in | Wt 131.4 lb

## 2022-04-05 DIAGNOSIS — I4891 Unspecified atrial fibrillation: Secondary | ICD-10-CM | POA: Insufficient documentation

## 2022-04-05 DIAGNOSIS — E039 Hypothyroidism, unspecified: Secondary | ICD-10-CM | POA: Insufficient documentation

## 2022-04-05 DIAGNOSIS — Z87891 Personal history of nicotine dependence: Secondary | ICD-10-CM | POA: Insufficient documentation

## 2022-04-05 DIAGNOSIS — N189 Chronic kidney disease, unspecified: Secondary | ICD-10-CM | POA: Diagnosis not present

## 2022-04-05 DIAGNOSIS — E1165 Type 2 diabetes mellitus with hyperglycemia: Secondary | ICD-10-CM

## 2022-04-05 DIAGNOSIS — I129 Hypertensive chronic kidney disease with stage 1 through stage 4 chronic kidney disease, or unspecified chronic kidney disease: Secondary | ICD-10-CM | POA: Diagnosis not present

## 2022-04-05 DIAGNOSIS — R634 Abnormal weight loss: Secondary | ICD-10-CM | POA: Insufficient documentation

## 2022-04-05 DIAGNOSIS — E785 Hyperlipidemia, unspecified: Secondary | ICD-10-CM | POA: Diagnosis not present

## 2022-04-05 DIAGNOSIS — D649 Anemia, unspecified: Secondary | ICD-10-CM

## 2022-04-05 DIAGNOSIS — E876 Hypokalemia: Secondary | ICD-10-CM | POA: Diagnosis not present

## 2022-04-05 DIAGNOSIS — E079 Disorder of thyroid, unspecified: Secondary | ICD-10-CM

## 2022-04-05 DIAGNOSIS — K760 Fatty (change of) liver, not elsewhere classified: Secondary | ICD-10-CM | POA: Diagnosis not present

## 2022-04-05 DIAGNOSIS — D509 Iron deficiency anemia, unspecified: Secondary | ICD-10-CM

## 2022-04-05 LAB — CBC WITH DIFFERENTIAL (CANCER CENTER ONLY)
Abs Immature Granulocytes: 0.03 10*3/uL (ref 0.00–0.07)
Basophils Absolute: 0.1 10*3/uL (ref 0.0–0.1)
Basophils Relative: 1 %
Eosinophils Absolute: 0.1 10*3/uL (ref 0.0–0.5)
Eosinophils Relative: 1 %
HCT: 30.7 % — ABNORMAL LOW (ref 36.0–46.0)
Hemoglobin: 10.1 g/dL — ABNORMAL LOW (ref 12.0–15.0)
Immature Granulocytes: 0 %
Lymphocytes Relative: 11 %
Lymphs Abs: 1 10*3/uL (ref 0.7–4.0)
MCH: 29 pg (ref 26.0–34.0)
MCHC: 32.9 g/dL (ref 30.0–36.0)
MCV: 88.2 fL (ref 80.0–100.0)
Monocytes Absolute: 1 10*3/uL (ref 0.1–1.0)
Monocytes Relative: 11 %
Neutro Abs: 6.5 10*3/uL (ref 1.7–7.7)
Neutrophils Relative %: 76 %
Platelet Count: 281 10*3/uL (ref 150–400)
RBC: 3.48 MIL/uL — ABNORMAL LOW (ref 3.87–5.11)
RDW: 13.2 % (ref 11.5–15.5)
WBC Count: 8.6 10*3/uL (ref 4.0–10.5)
nRBC: 0 % (ref 0.0–0.2)

## 2022-04-05 LAB — CMP (CANCER CENTER ONLY)
ALT: 103 U/L — ABNORMAL HIGH (ref 0–44)
AST: 73 U/L — ABNORMAL HIGH (ref 15–41)
Albumin: 3.6 g/dL (ref 3.5–5.0)
Alkaline Phosphatase: 72 U/L (ref 38–126)
Anion gap: 9 (ref 5–15)
BUN: 20 mg/dL (ref 8–23)
CO2: 27 mmol/L (ref 22–32)
Calcium: 9 mg/dL (ref 8.9–10.3)
Chloride: 98 mmol/L (ref 98–111)
Creatinine: 1.16 mg/dL — ABNORMAL HIGH (ref 0.44–1.00)
GFR, Estimated: 46 mL/min — ABNORMAL LOW (ref >60)
Glucose, Bld: 272 mg/dL — ABNORMAL HIGH (ref 70–99)
Potassium: 3.1 mmol/L — ABNORMAL LOW (ref 3.5–5.1)
Sodium: 134 mmol/L — ABNORMAL LOW (ref 135–145)
Total Bilirubin: 0.4 mg/dL (ref 0.3–1.2)
Total Protein: 6.9 g/dL (ref 6.5–8.1)

## 2022-04-05 LAB — FOLATE: Folate: 19.8 ng/mL (ref >5.9)

## 2022-04-05 LAB — IRON AND IRON BINDING CAPACITY (CC-WL,HP ONLY)
Iron: 29 ug/dL (ref 28–170)
Saturation Ratios: 12 % (ref 10.4–31.8)
TIBC: 249 ug/dL — ABNORMAL LOW (ref 250–450)
UIBC: 220 ug/dL (ref 148–442)

## 2022-04-05 LAB — FERRITIN: Ferritin: 210 ng/mL (ref 11–307)

## 2022-04-05 LAB — VITAMIN B12: Vitamin B-12: 1076 pg/mL — ABNORMAL HIGH (ref 180–914)

## 2022-04-05 MED ORDER — INTEGRA PLUS PO CAPS: 1.0000 | ORAL_CAPSULE | Freq: Every morning | ORAL | 2 refills | Status: DC

## 2022-04-05 NOTE — Telephone Encounter (Signed)
Attempted to call pt regarding labs. Unable to leave a message due to VM being full.

## 2022-04-06 DIAGNOSIS — R748 Abnormal levels of other serum enzymes: Secondary | ICD-10-CM | POA: Diagnosis not present

## 2022-04-06 DIAGNOSIS — D649 Anemia, unspecified: Secondary | ICD-10-CM | POA: Diagnosis not present

## 2022-04-06 DIAGNOSIS — E871 Hypo-osmolality and hyponatremia: Secondary | ICD-10-CM | POA: Diagnosis not present

## 2022-04-07 ENCOUNTER — Telehealth: Payer: Self-pay | Admitting: Physician Assistant

## 2022-04-07 LAB — PROTEIN ELECTROPHORESIS, SERUM, WITH REFLEX
A/G Ratio: 1 (ref 0.7–1.7)
Albumin ELP: 3.2 g/dL (ref 2.9–4.4)
Alpha-1-Globulin: 0.2 g/dL (ref 0.0–0.4)
Alpha-2-Globulin: 1 g/dL (ref 0.4–1.0)
Beta Globulin: 0.9 g/dL (ref 0.7–1.3)
Gamma Globulin: 1 g/dL (ref 0.4–1.8)
Globulin, Total: 3.2 g/dL (ref 2.2–3.9)
Total Protein ELP: 6.4 g/dL (ref 6.0–8.5)

## 2022-04-07 NOTE — Telephone Encounter (Signed)
Second attempt to call the patient to review lab work. Unable to reach her as her voicemail is full. Will try again tomorrow.

## 2022-04-08 ENCOUNTER — Ambulatory Visit: Payer: Medicare PPO | Admitting: Internal Medicine

## 2022-04-08 LAB — ERYTHROPOIETIN: Erythropoietin: 29 m[IU]/mL — ABNORMAL HIGH (ref 2.6–18.5)

## 2022-04-09 ENCOUNTER — Telehealth: Payer: Self-pay | Admitting: Physician Assistant

## 2022-04-09 DIAGNOSIS — I129 Hypertensive chronic kidney disease with stage 1 through stage 4 chronic kidney disease, or unspecified chronic kidney disease: Secondary | ICD-10-CM | POA: Diagnosis not present

## 2022-04-09 DIAGNOSIS — E039 Hypothyroidism, unspecified: Secondary | ICD-10-CM | POA: Diagnosis not present

## 2022-04-09 DIAGNOSIS — N1831 Chronic kidney disease, stage 3a: Secondary | ICD-10-CM | POA: Diagnosis not present

## 2022-04-09 DIAGNOSIS — E1122 Type 2 diabetes mellitus with diabetic chronic kidney disease: Secondary | ICD-10-CM | POA: Diagnosis not present

## 2022-04-09 NOTE — Telephone Encounter (Signed)
When I saw the patient for consultation a few days ago, I let her know I would call her back with her lab results this week. She mentioned to me she receives so many spam calls that she has not set up her voicemail. I let her know our office number in case she sees a missed call from our office so she knows the call is from us/legitimate and to call us back. I have attempted to call her 3 times to review her results. Unable to reach her after numerous attempts. If she calls back, Dr. Julien Nordmann wants her to start integra plus which was sent to her pharmacy. We will see back back as scheduled in 2 months.

## 2022-04-10 DIAGNOSIS — E1065 Type 1 diabetes mellitus with hyperglycemia: Secondary | ICD-10-CM | POA: Diagnosis not present

## 2022-04-12 DIAGNOSIS — E871 Hypo-osmolality and hyponatremia: Secondary | ICD-10-CM | POA: Diagnosis not present

## 2022-04-12 DIAGNOSIS — R3129 Other microscopic hematuria: Secondary | ICD-10-CM | POA: Diagnosis not present

## 2022-04-14 DIAGNOSIS — R3129 Other microscopic hematuria: Secondary | ICD-10-CM | POA: Diagnosis not present

## 2022-04-14 DIAGNOSIS — R300 Dysuria: Secondary | ICD-10-CM | POA: Diagnosis not present

## 2022-04-19 DIAGNOSIS — E876 Hypokalemia: Secondary | ICD-10-CM | POA: Diagnosis not present

## 2022-04-19 DIAGNOSIS — R300 Dysuria: Secondary | ICD-10-CM | POA: Diagnosis not present

## 2022-04-19 DIAGNOSIS — R3129 Other microscopic hematuria: Secondary | ICD-10-CM | POA: Diagnosis not present

## 2022-04-23 DIAGNOSIS — R350 Frequency of micturition: Secondary | ICD-10-CM | POA: Diagnosis not present

## 2022-04-23 DIAGNOSIS — R3121 Asymptomatic microscopic hematuria: Secondary | ICD-10-CM | POA: Diagnosis not present

## 2022-04-23 DIAGNOSIS — R351 Nocturia: Secondary | ICD-10-CM | POA: Diagnosis not present

## 2022-04-23 DIAGNOSIS — N3941 Urge incontinence: Secondary | ICD-10-CM | POA: Diagnosis not present

## 2022-04-28 ENCOUNTER — Telehealth: Payer: Self-pay | Admitting: Physician Assistant

## 2022-04-28 NOTE — Telephone Encounter (Signed)
Called patient regarding upcoming August appointment, patient has been called and notified.  

## 2022-04-30 ENCOUNTER — Telehealth: Payer: Self-pay

## 2022-04-30 ENCOUNTER — Emergency Department (HOSPITAL_BASED_OUTPATIENT_CLINIC_OR_DEPARTMENT_OTHER): Payer: Medicare PPO

## 2022-04-30 ENCOUNTER — Emergency Department (HOSPITAL_BASED_OUTPATIENT_CLINIC_OR_DEPARTMENT_OTHER)
Admission: EM | Admit: 2022-04-30 | Discharge: 2022-05-01 | Disposition: A | Discharge status: Home / Self Care | Payer: Medicare PPO | Attending: Emergency Medicine | Admitting: Emergency Medicine

## 2022-04-30 ENCOUNTER — Encounter (HOSPITAL_BASED_OUTPATIENT_CLINIC_OR_DEPARTMENT_OTHER): Payer: Self-pay | Admitting: Emergency Medicine

## 2022-04-30 ENCOUNTER — Other Ambulatory Visit: Payer: Self-pay

## 2022-04-30 DIAGNOSIS — Z794 Long term (current) use of insulin: Secondary | ICD-10-CM | POA: Insufficient documentation

## 2022-04-30 DIAGNOSIS — S0990XA Unspecified injury of head, initial encounter: Secondary | ICD-10-CM

## 2022-04-30 DIAGNOSIS — Z23 Encounter for immunization: Secondary | ICD-10-CM | POA: Insufficient documentation

## 2022-04-30 DIAGNOSIS — I6381 Other cerebral infarction due to occlusion or stenosis of small artery: Secondary | ICD-10-CM | POA: Diagnosis not present

## 2022-04-30 DIAGNOSIS — Z79899 Other long term (current) drug therapy: Secondary | ICD-10-CM | POA: Diagnosis not present

## 2022-04-30 DIAGNOSIS — Z7901 Long term (current) use of anticoagulants: Secondary | ICD-10-CM | POA: Diagnosis not present

## 2022-04-30 DIAGNOSIS — W19XXXA Unspecified fall, initial encounter: Secondary | ICD-10-CM | POA: Diagnosis not present

## 2022-04-30 DIAGNOSIS — S0101XA Laceration without foreign body of scalp, initial encounter: Secondary | ICD-10-CM | POA: Diagnosis not present

## 2022-04-30 DIAGNOSIS — G319 Degenerative disease of nervous system, unspecified: Secondary | ICD-10-CM | POA: Diagnosis not present

## 2022-04-30 DIAGNOSIS — E119 Type 2 diabetes mellitus without complications: Secondary | ICD-10-CM | POA: Insufficient documentation

## 2022-04-30 DIAGNOSIS — Z87891 Personal history of nicotine dependence: Secondary | ICD-10-CM | POA: Diagnosis not present

## 2022-04-30 DIAGNOSIS — Z043 Encounter for examination and observation following other accident: Secondary | ICD-10-CM | POA: Diagnosis not present

## 2022-04-30 DIAGNOSIS — I10 Essential (primary) hypertension: Secondary | ICD-10-CM | POA: Diagnosis not present

## 2022-04-30 DIAGNOSIS — I6782 Cerebral ischemia: Secondary | ICD-10-CM | POA: Diagnosis not present

## 2022-04-30 DIAGNOSIS — S0001XA Abrasion of scalp, initial encounter: Secondary | ICD-10-CM

## 2022-04-30 NOTE — Telephone Encounter (Signed)
Patient came into [facility] on 04-30-22 wanting DMV paperwork that Hilty has to complete.  No one had called her to tell her it was ready.  She was informed that someone would call her as soon as that paperwork was completed.

## 2022-04-30 NOTE — ED Triage Notes (Signed)
Pt loss balance in driveway and hit head on gravel driveway. Pt has laceration to head. Pt is on blood thinners.

## 2022-04-30 NOTE — Telephone Encounter (Signed)
I contacted patient, advised that the message was sent to the wrong [name], spoke with patient and advised I would route to Dr.Hilty's nurse to see about paperwork- she needs this as soon as possible. I was unsure if this was already filled out, or if it needed to be filled out.   Patient verbalized understanding, thankful for call back.

## 2022-05-01 MED ORDER — TETANUS-DIPHTH-ACELL PERTUSSIS 5-2.5-18.5 LF-MCG/0.5 IM SUSY
0.5000 mL | PREFILLED_SYRINGE | Freq: Once | INTRAMUSCULAR | Status: AC
  Administered 2022-05-01: 0.5 mL via INTRAMUSCULAR
  Filled 2022-05-01: qty 0.5

## 2022-05-01 MED ORDER — LIDOCAINE-EPINEPHRINE (PF) 2 %-1:200000 IJ SOLN
10.0000 mL | Freq: Once | INTRAMUSCULAR | Status: AC
  Administered 2022-05-01: 10 mL via INTRADERMAL
  Filled 2022-05-01: qty 20

## 2022-05-01 NOTE — ED Provider Notes (Signed)
[place] DEPT [facility] Provider Note: Georgena Spurling, MD, FACEP  CSN: [id number] MRN: [id number] ARRIVAL: 04/30/22 at [place]: [place]   HISTORY OF PRESENT ILLNESS  05/01/22 12:03 AM [name] is a 87 y.o. female who lost her balance and fell in her gravel driveway just prior to arrival.  She struck the back of her head and has a laceration to the occiput.  Bleeding is now controlled.  She is on Eliquis.  She rates the pain in her head as a 7 out of 10.  She did not lose consciousness and has not been vomiting.   Past Medical History:  Diagnosis Date   Colon polyp    Diabetes mellitus    Hyperlipidemia    Hypertension    Poor appetite    Thyroid disease     Past Surgical History:  Procedure Laterality Date   BIOPSY  02/07/2022   Procedure: BIOPSY;  Surgeon: Ladene Artist, MD;  Location: [facility] ENDOSCOPY;  Service: Gastroenterology;;   BREAST EXCISIONAL BIOPSY Right    CARDIOVASCULAR STRESS TEST  07/15/2006   Normal scan, no ECG changes   COLONOSCOPY WITH PROPOFOL N/A 02/07/2022   Procedure: COLONOSCOPY WITH PROPOFOL;  Surgeon: Ladene Artist, MD;  Location: [place];  Service: Gastroenterology;  Laterality: N/A;   ESOPHAGOGASTRODUODENOSCOPY (EGD) WITH PROPOFOL N/A 02/07/2022   Procedure: ESOPHAGOGASTRODUODENOSCOPY (EGD) WITH PROPOFOL;  Surgeon: Ladene Artist, MD;  Location: [place];  Service: Gastroenterology;  Laterality: N/A;   TRANSTHORACIC ECHOCARDIOGRAM  01/08/2011   EF 60-65%, moderae mitral regurg, LA mild-moderately dilated,    Family History  Problem Relation Age of Onset   Diabetes Mother    Hypertension Father    Hyperlipidemia Father    Diabetes Sister    Hypertension Sister    Hyperlipidemia Sister    Hypertension Brother    Stroke Sister    Hypertension Sister    Diabetes Sister    Cancer Daughter    Heart failure Child    Breast cancer Neg Hx     Social History   Tobacco Use   Smoking status: Former     Types: Cigarettes    Quit date: 05/18/2003    Years since quitting: 18.9   Smokeless tobacco: Never  Vaping Use   Vaping Use: Never used  Substance Use Topics   Alcohol use: No   Drug use: No    Prior to Admission medications   Medication Sig Start Date End Date Taking? Authorizing Provider  amiodarone (PACERONE) 200 MG tablet Take 1 tablet (200 mg total) by mouth daily. Direction change 02/17/22   Hilty, Nadean Corwin, MD  apixaban (ELIQUIS) 5 MG TABS tablet Take 1 tablet (5 mg) twice Daily 02/22/22   Deberah Pelton, NP  atorvastatin (LIPITOR) 20 MG tablet Take 20 mg by mouth daily. 02/01/20   [provider]  FeFum-FePoly-FA-B Cmp-C-Biot (INTEGRA PLUS) CAPS Take 1 capsule by mouth every morning. 04/05/22   Heilingoetter, Cassandra L, PA-C  insulin aspart (NOVOLOG FLEXPEN) 100 UNIT/ML FlexPen Inject 8 Units into the skin 3 (three) times daily with meals. 09/04/21   Mariel Aloe, MD  insulin glargine, 2 Unit Dial, (TOUJEO MAX SOLOSTAR) 300 UNIT/ML Solostar Pen Inject 12 Units into the skin daily. 09/04/21   Mariel Aloe, MD  KLOR-CON M20 20 MEQ tablet Take 1 tablet by mouth daily. 02/02/22   [provider]  levothyroxine (SYNTHROID) 88 MCG tablet Take 1 tablet (88 mcg total) by  mouth daily before breakfast. 02/08/22   Bonnielee Haff, MD  pantoprazole (PROTONIX) 40 MG tablet Take 1 tablet (40 mg total) by mouth daily at 6 (six) AM. 02/09/22   Bonnielee Haff, MD  polyethylene glycol (MIRALAX / GLYCOLAX) 17 g packet Take 17 g by mouth daily. 02/09/22   Bonnielee Haff, MD  senna-docusate (SENOKOT-S) 8.6-50 MG tablet Take 2 tablets by mouth at bedtime. 02/08/22   Bonnielee Haff, MD  TIADYLT ER 180 MG 24 hr capsule TAKE 1 CAPSULE BY MOUTH EVERY DAY Patient taking differently: Take 180 mg by mouth daily. 11/27/21   Hilty, Nadean Corwin, MD    Allergies Patient has no known allergies.   REVIEW OF SYSTEMS  Negative except as noted here or in the History of Present  Illness.   PHYSICAL EXAMINATION  Initial Vital Signs Blood pressure (!) 134/54, pulse 69, temperature 98.6 F (37 C), temperature source Oral, resp. rate 18, height '5\' 2"'$  (1.575 m), weight 59.4 kg, SpO2 98 %.  Examination General: Well-developed, well-nourished female in no acute distress; appearance consistent with age of record HENT: normocephalic; laceration and adjacent small abrasion to occiput Eyes: Normal appearance Neck: supple; no C-spine tenderness; right soft tissue tenderness Heart: regular rate and rhythm; no murmurs, rubs or gallops Lungs: clear to auscultation bilaterally Abdomen: soft; nondistended; nontender; bowel sounds present Extremities: No deformity; full range of motion; pulses normal; trace edema of lower legs Neurologic: Awake, alert and oriented; motor function intact in all extremities and symmetric; no facial droop Skin: Warm and dry Psychiatric: Normal mood and affect   RESULTS  Summary of this visit's results, reviewed and interpreted by myself:   EKG Interpretation  Date/Time:    Ventricular Rate:    PR Interval:    QRS Duration:   QT Interval:    QTC Calculation:   R Axis:     Text Interpretation:         Laboratory Studies: No results found for this or any previous visit (from the past 24 hour(s)). Imaging Studies: CT Cervical Spine Wo Contrast  Result Date: 04/30/2022 CLINICAL DATA:  Status post fall. EXAM: CT CERVICAL SPINE WITHOUT CONTRAST TECHNIQUE: Multidetector CT imaging of the cervical spine was performed without intravenous contrast. Multiplanar CT image reconstructions were also generated. RADIATION DOSE REDUCTION: This exam was performed according to the departmental dose-optimization program which includes automated exposure control, adjustment of the mA and/or kV according to patient size and/or use of iterative reconstruction technique. COMPARISON:  None Available. FINDINGS: Alignment: There is mild straightening of the  normal cervical spine lordosis. Approximately 1 mm anterolisthesis of the C4 vertebral body is noted on C5. Skull base and vertebrae: No acute fracture. No primary bone lesion or focal pathologic process. Soft tissues and spinal canal: No prevertebral fluid or swelling. No visible canal hematoma. Disc levels: Marked severity endplate sclerosis is seen at the levels of C3-C4, C5-C6 and C6-C7. Marked severity intervertebral disc space narrowing is seen at C3-C4, C5-C6 and C6-C7. Bilateral marked severity multilevel facet joint hypertrophy is noted. Upper chest: Negative. Other: None. IMPRESSION: 1. Marked severity multilevel degenerative changes without evidence of an acute fracture or subluxation. Electronically Signed   By: Virgina Norfolk M.D.   On: 04/30/2022 23:00   CT Head Wo Contrast  Result Date: 04/30/2022 CLINICAL DATA:  Status post fall. EXAM: CT HEAD WITHOUT CONTRAST TECHNIQUE: Contiguous axial images were obtained from the base of the skull through the vertex without intravenous contrast. RADIATION DOSE REDUCTION: This exam was performed  according to the departmental dose-optimization program which includes automated exposure control, adjustment of the mA and/or kV according to patient size and/or use of iterative reconstruction technique. COMPARISON:  October 02, 2021 FINDINGS: Brain: There is mild cerebral atrophy with widening of the extra-axial spaces and ventricular dilatation. There are areas of decreased attenuation within the white matter tracts of the supratentorial brain, consistent with microvascular disease changes. 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 Vascular: No hyperdense vessel or unexpected calcification. Skull: Normal. Negative for fracture or focal lesion. Sinuses/Orbits: No acute finding. Other: None. IMPRESSION: 1. No acute intracranial abnormality. 2. Generalized cerebral atrophy with chronic white matter small vessel ischemic changes.  Electronically Signed   By: Virgina Norfolk M.D.   On: 04/30/2022 22:57    ED COURSE and MDM  Nursing notes, initial and subsequent vitals signs, including pulse oximetry, reviewed and interpreted by myself.  Vitals:   04/30/22 2234 04/30/22 2300 05/01/22 0000 05/01/22 0042  BP: (!) 171/65 (!) 134/54 (!) 135/50 (!) 133/58  Pulse: 73 69 70 73  Resp: '16 18 18 18  '$ Temp: 98.6 F (37 C)   98.5 F (36.9 C)  TempSrc: Oral   Oral  SpO2: 97% 98% 97% 97%  Weight:      Height:       Medications  lidocaine-EPINEPHrine (XYLOCAINE W/EPI) 2 %-1:200000 (PF) injection 10 mL (10 mLs Intradermal Given 05/01/22 0027)  Tdap (BOOSTRIX) injection 0.5 mL (0.5 mLs Intramuscular Given 05/01/22 0023)   No evidence of bony injury or intracranial injury on CT scans.  The patient's scalp laceration was closed as described below.  She and her daughter were advised of signs of worsening brain injury that should warrant return.   PROCEDURES  Procedures LACERATION REPAIR Performed by: Karen Chafe Vicent Febles Authorized by: Karen Chafe Xavyer Steenson Consent: Verbal consent obtained. Risks and benefits: risks, benefits and alternatives were discussed Consent given by: patient Patient identity confirmed: provided demographic data Prepped and Draped in normal sterile fashion Wound explored  Laceration Location: Occiput  Laceration Length: 2 cm  No Foreign Bodies seen or palpated  Anesthesia: local infiltration  Local anesthetic: lidocaine 2% with epinephrine  Anesthetic total: 3 ml  Irrigation method: syringe Amount of cleaning: standard  Skin closure: Staples  Number of staples: 4  Patient tolerance: Patient tolerated the procedure well with no immediate complications.   ED DIAGNOSES     ICD-10-CM   1. Fall, initial encounter  W19.XXXA     2. Minor head injury, initial encounter  S09.90XA     3. Scalp laceration, initial encounter  S01.01XA     4. Abrasion of scalp, initial encounter  S00.01XA           Jeanne Terrance, Jenny Reichmann, MD 05/01/22 [phone]

## 2022-05-02 ENCOUNTER — Observation Stay (HOSPITAL_COMMUNITY)
Admission: EM | Admit: 2022-05-02 | Discharge: 2022-05-04 | Disposition: A | Discharge status: Home Health | Payer: Medicare PPO | Attending: Student | Admitting: Student

## 2022-05-02 ENCOUNTER — Encounter (HOSPITAL_COMMUNITY): Payer: Self-pay | Admitting: Emergency Medicine

## 2022-05-02 ENCOUNTER — Other Ambulatory Visit: Payer: Self-pay

## 2022-05-02 ENCOUNTER — Emergency Department (HOSPITAL_COMMUNITY): Payer: Medicare PPO

## 2022-05-02 DIAGNOSIS — E1110 Type 2 diabetes mellitus with ketoacidosis without coma: Secondary | ICD-10-CM | POA: Diagnosis not present

## 2022-05-02 DIAGNOSIS — E871 Hypo-osmolality and hyponatremia: Secondary | ICD-10-CM | POA: Insufficient documentation

## 2022-05-02 DIAGNOSIS — E7800 Pure hypercholesterolemia, unspecified: Secondary | ICD-10-CM | POA: Diagnosis not present

## 2022-05-02 DIAGNOSIS — Z794 Long term (current) use of insulin: Secondary | ICD-10-CM | POA: Insufficient documentation

## 2022-05-02 DIAGNOSIS — I10 Essential (primary) hypertension: Secondary | ICD-10-CM | POA: Diagnosis present

## 2022-05-02 DIAGNOSIS — R739 Hyperglycemia, unspecified: Secondary | ICD-10-CM | POA: Diagnosis not present

## 2022-05-02 DIAGNOSIS — N1832 Chronic kidney disease, stage 3b: Secondary | ICD-10-CM | POA: Insufficient documentation

## 2022-05-02 DIAGNOSIS — Y92009 Unspecified place in unspecified non-institutional (private) residence as the place of occurrence of the external cause: Secondary | ICD-10-CM | POA: Diagnosis not present

## 2022-05-02 DIAGNOSIS — N1831 Chronic kidney disease, stage 3a: Secondary | ICD-10-CM | POA: Diagnosis present

## 2022-05-02 DIAGNOSIS — I480 Paroxysmal atrial fibrillation: Secondary | ICD-10-CM | POA: Diagnosis not present

## 2022-05-02 DIAGNOSIS — N179 Acute kidney failure, unspecified: Secondary | ICD-10-CM | POA: Diagnosis not present

## 2022-05-02 DIAGNOSIS — Z87891 Personal history of nicotine dependence: Secondary | ICD-10-CM | POA: Diagnosis not present

## 2022-05-02 DIAGNOSIS — E1165 Type 2 diabetes mellitus with hyperglycemia: Secondary | ICD-10-CM | POA: Diagnosis not present

## 2022-05-02 DIAGNOSIS — S0101XA Laceration without foreign body of scalp, initial encounter: Secondary | ICD-10-CM

## 2022-05-02 DIAGNOSIS — E039 Hypothyroidism, unspecified: Secondary | ICD-10-CM | POA: Diagnosis not present

## 2022-05-02 DIAGNOSIS — R2689 Other abnormalities of gait and mobility: Secondary | ICD-10-CM | POA: Diagnosis not present

## 2022-05-02 DIAGNOSIS — I440 Atrioventricular block, first degree: Secondary | ICD-10-CM

## 2022-05-02 DIAGNOSIS — R2681 Unsteadiness on feet: Secondary | ICD-10-CM

## 2022-05-02 DIAGNOSIS — M533 Sacrococcygeal disorders, not elsewhere classified: Secondary | ICD-10-CM | POA: Diagnosis not present

## 2022-05-02 DIAGNOSIS — Z7901 Long term (current) use of anticoagulants: Secondary | ICD-10-CM | POA: Insufficient documentation

## 2022-05-02 DIAGNOSIS — W19XXXA Unspecified fall, initial encounter: Secondary | ICD-10-CM | POA: Insufficient documentation

## 2022-05-02 LAB — MRSA NEXT GEN BY PCR, NASAL: MRSA by PCR Next Gen: NOT DETECTED

## 2022-05-02 LAB — BASIC METABOLIC PANEL
Anion gap: 17 — ABNORMAL HIGH (ref 5–15)
Anion gap: 9 (ref 5–15)
BUN: 24 mg/dL — ABNORMAL HIGH (ref 8–23)
BUN: 21 mg/dL (ref 8–23)
CO2: 11 mmol/L — ABNORMAL LOW (ref 22–32)
CO2: 18 mmol/L — ABNORMAL LOW (ref 22–32)
Calcium: 8.4 mg/dL — ABNORMAL LOW (ref 8.9–10.3)
Calcium: 8.7 mg/dL — ABNORMAL LOW (ref 8.9–10.3)
Chloride: 105 mmol/L (ref 98–111)
Chloride: 108 mmol/L (ref 98–111)
Creatinine, Ser: 1.51 mg/dL — ABNORMAL HIGH (ref 0.44–1.00)
Creatinine, Ser: 1.19 mg/dL — ABNORMAL HIGH (ref 0.44–1.00)
GFR, Estimated: 34 mL/min — ABNORMAL LOW (ref >60)
GFR, Estimated: 45 mL/min — ABNORMAL LOW (ref >60)
Glucose, Bld: 355 mg/dL — ABNORMAL HIGH (ref 70–99)
Glucose, Bld: 153 mg/dL — ABNORMAL HIGH (ref 70–99)
Potassium: 4.2 mmol/L (ref 3.5–5.1)
Potassium: 4.6 mmol/L (ref 3.5–5.1)
Sodium: 133 mmol/L — ABNORMAL LOW (ref 135–145)
Sodium: 135 mmol/L (ref 135–145)

## 2022-05-02 LAB — GLUCOSE, CAPILLARY
Glucose-Capillary: 139 mg/dL — ABNORMAL HIGH (ref 70–99)
Glucose-Capillary: 126 mg/dL — ABNORMAL HIGH (ref 70–99)
Glucose-Capillary: 129 mg/dL — ABNORMAL HIGH (ref 70–99)
Glucose-Capillary: 127 mg/dL — ABNORMAL HIGH (ref 70–99)

## 2022-05-02 LAB — CBG MONITORING, ED
Glucose-Capillary: 481 mg/dL — ABNORMAL HIGH (ref 70–99)
Glucose-Capillary: 426 mg/dL — ABNORMAL HIGH (ref 70–99)
Glucose-Capillary: 352 mg/dL — ABNORMAL HIGH (ref 70–99)
Glucose-Capillary: 269 mg/dL — ABNORMAL HIGH (ref 70–99)
Glucose-Capillary: 165 mg/dL — ABNORMAL HIGH (ref 70–99)
Glucose-Capillary: 195 mg/dL — ABNORMAL HIGH (ref 70–99)
Glucose-Capillary: 132 mg/dL — ABNORMAL HIGH (ref 70–99)

## 2022-05-02 LAB — BLOOD GAS, VENOUS
Acid-base deficit: 10.1 mmol/L — ABNORMAL HIGH (ref 0.0–2.0)
Bicarbonate: 16.7 mmol/L — ABNORMAL LOW (ref 20.0–28.0)
O2 Saturation: 38.6 %
Patient temperature: 37
pCO2, Ven: 39 mmHg — ABNORMAL LOW (ref 44–60)
pH, Ven: 7.24 — ABNORMAL LOW (ref 7.25–7.43)
pO2, Ven: <31 mmHg — CL (ref 32–45)

## 2022-05-02 LAB — URINALYSIS, ROUTINE W REFLEX MICROSCOPIC
Bilirubin Urine: NEGATIVE
Glucose, UA: >=500 mg/dL — AB
Hgb urine dipstick: NEGATIVE
Ketones, ur: 80 mg/dL — AB
Leukocytes,Ua: NEGATIVE
Nitrite: NEGATIVE
Protein, ur: 30 mg/dL — AB
Specific Gravity, Urine: 1.022 (ref 1.005–1.030)
pH: 5 (ref 5.0–8.0)

## 2022-05-02 LAB — CBC
HCT: 37.3 % (ref 36.0–46.0)
Hemoglobin: 11.5 g/dL — ABNORMAL LOW (ref 12.0–15.0)
MCH: 28.3 pg (ref 26.0–34.0)
MCHC: 30.8 g/dL (ref 30.0–36.0)
MCV: 91.6 fL (ref 80.0–100.0)
Platelets: 216 10*3/uL (ref 150–400)
RBC: 4.07 MIL/uL (ref 3.87–5.11)
RDW: 14.6 % (ref 11.5–15.5)
WBC: 8.5 10*3/uL (ref 4.0–10.5)
nRBC: 0 % (ref 0.0–0.2)

## 2022-05-02 LAB — BETA-HYDROXYBUTYRIC ACID: Beta-Hydroxybutyric Acid: 6.68 mmol/L — ABNORMAL HIGH (ref 0.05–0.27)

## 2022-05-02 MED ORDER — LACTATED RINGERS IV SOLN: INTRAVENOUS | Status: DC

## 2022-05-02 MED ORDER — DEXTROSE IN LACTATED RINGERS 5 % IV SOLN
INTRAVENOUS | Status: DC
Start: 2022-05-02 — End: 2022-05-03

## 2022-05-02 MED ORDER — MIRTAZAPINE 15 MG PO TABS
15.0000 mg | ORAL_TABLET | Freq: Every day | ORAL | Status: DC
  Administered 2022-05-02 – 2022-05-03 (×2): 15 mg via ORAL
  Filled 2022-05-02 (×2): qty 1

## 2022-05-02 MED ORDER — PANTOPRAZOLE SODIUM 40 MG PO TBEC
40.0000 mg | DELAYED_RELEASE_TABLET | Freq: Every day | ORAL | Status: DC
Start: 2022-05-03 — End: 2022-05-04
  Administered 2022-05-03 – 2022-05-04 (×2): 40 mg via ORAL
  Filled 2022-05-02 (×2): qty 1

## 2022-05-02 MED ORDER — INSULIN ASPART 100 UNIT/ML IJ SOLN
8.0000 [IU] | Freq: Once | INTRAMUSCULAR | Status: AC
Start: 2022-05-02 — End: 2022-05-02
  Administered 2022-05-02: 8 [IU] via SUBCUTANEOUS
  Filled 2022-05-02: qty 0.08

## 2022-05-02 MED ORDER — LACTATED RINGERS IV BOLUS
20.0000 mL/kg | Freq: Once | INTRAVENOUS | Status: AC
  Administered 2022-05-02: 1188 mL via INTRAVENOUS

## 2022-05-02 MED ORDER — DILTIAZEM HCL ER COATED BEADS 180 MG PO CP24
180.0000 mg | ORAL_CAPSULE | Freq: Every day | ORAL | Status: DC
Start: 2022-05-02 — End: 2022-05-03
  Administered 2022-05-02 – 2022-05-03 (×2): 180 mg via ORAL
  Filled 2022-05-02 (×2): qty 1

## 2022-05-02 MED ORDER — SODIUM CHLORIDE 0.9 % IV BOLUS
1500.0000 mL | Freq: Once | INTRAVENOUS | Status: AC
  Administered 2022-05-02: 1500 mL via INTRAVENOUS

## 2022-05-02 MED ORDER — APIXABAN 2.5 MG PO TABS
2.5000 mg | ORAL_TABLET | Freq: Two times a day (BID) | ORAL | Status: DC
  Administered 2022-05-02 – 2022-05-04 (×4): 2.5 mg via ORAL
  Filled 2022-05-02 (×4): qty 1

## 2022-05-02 MED ORDER — POTASSIUM CHLORIDE 10 MEQ/100ML IV SOLN
10.0000 meq | INTRAVENOUS | Status: AC
  Administered 2022-05-02 (×2): 10 meq via INTRAVENOUS
  Filled 2022-05-02 (×2): qty 100

## 2022-05-02 MED ORDER — MORPHINE SULFATE (PF) 2 MG/ML IV SOLN
2.0000 mg | Freq: Once | INTRAVENOUS | Status: AC
  Administered 2022-05-02: 2 mg via INTRAVENOUS
  Filled 2022-05-02: qty 1

## 2022-05-02 MED ORDER — DILTIAZEM HCL ER BEADS 180 MG PO CP24: 180.0000 mg | ORAL_CAPSULE | Freq: Every day | ORAL | Status: DC

## 2022-05-02 MED ORDER — AMIODARONE HCL 200 MG PO TABS
200.0000 mg | ORAL_TABLET | Freq: Two times a day (BID) | ORAL | Status: DC
  Administered 2022-05-02 – 2022-05-03 (×2): 200 mg via ORAL
  Filled 2022-05-02 (×3): qty 1

## 2022-05-02 MED ORDER — POLYETHYLENE GLYCOL 3350 17 G PO PACK
17.0000 g | PACK | Freq: Every day | ORAL | Status: DC
  Administered 2022-05-03 – 2022-05-04 (×2): 17 g via ORAL
  Filled 2022-05-02 (×2): qty 1

## 2022-05-02 MED ORDER — ORAL CARE MOUTH RINSE: 15.0000 mL | OROMUCOSAL | Status: DC | PRN

## 2022-05-02 MED ORDER — SENNOSIDES-DOCUSATE SODIUM 8.6-50 MG PO TABS
2.0000 | ORAL_TABLET | Freq: Every day | ORAL | Status: DC
  Administered 2022-05-02 – 2022-05-03 (×2): 2 via ORAL
  Filled 2022-05-02 (×2): qty 2

## 2022-05-02 MED ORDER — INSULIN REGULAR(HUMAN) IN NACL 100-0.9 UT/100ML-% IV SOLN
INTRAVENOUS | Status: DC
Start: 2022-05-02 — End: 2022-05-03
  Administered 2022-05-02: 9 [IU]/h via INTRAVENOUS
  Filled 2022-05-02: qty 100

## 2022-05-02 MED ORDER — DEXTROSE 50 % IV SOLN
0.0000 mL | INTRAVENOUS | Status: DC | PRN
  Administered 2022-05-03: 25 mL via INTRAVENOUS
  Filled 2022-05-02: qty 50

## 2022-05-02 MED ORDER — CHLORHEXIDINE GLUCONATE CLOTH 2 % EX PADS
6.0000 | MEDICATED_PAD | Freq: Every day | CUTANEOUS | Status: DC
  Administered 2022-05-02 – 2022-05-03 (×2): 6 via TOPICAL

## 2022-05-02 MED ORDER — LEVOTHYROXINE SODIUM 88 MCG PO TABS
88.0000 ug | ORAL_TABLET | Freq: Every day | ORAL | Status: DC
  Administered 2022-05-03 – 2022-05-04 (×2): 88 ug via ORAL
  Filled 2022-05-02 (×2): qty 1

## 2022-05-02 NOTE — ED Provider Notes (Signed)
[place] DEPT Provider Note   CSN: [id number] Arrival date & time: 05/02/22  1029     History  Chief Complaint  Patient presents with   Hyperglycemia    [name] is a 87 y.o. female with PMH significant for insulin dependent DM2, HLD, CKD who presents with concern for elevated blood glucose on home readings. Patient endorses some feelings of weakness and general malaise due to elevated blood sugar. She denies confusion, nausea, vomiting, abdominal pain. Does endorse increased thirst and urinary frequency. Daughter reports when they saw high readings they gave her 12 units long acting, 9 units short acting insulin last night, followed by 5 more units of short acting this morning prior to arriving in the hospital. Patient had a fall two days prior to presentation today and had clear head and neck CT, today she reports ongoing sacrum / coccyx pain. These areas were not radiographically evaluated at her old visit. She denies groin numbness, tingling, or leg weakness.   Hyperglycemia Associated symptoms: increased thirst, polyuria and weakness        Home Medications Prior to Admission medications   Medication Sig Start Date End Date Taking? Authorizing Provider  amiodarone (PACERONE) 200 MG tablet Take 1 tablet (200 mg total) by mouth daily. Direction change Patient taking differently: Take 200 mg by mouth 2 (two) times daily. Direction change 02/17/22  Yes Hilty, Nadean Corwin, MD  ELIQUIS 2.5 MG TABS tablet Take 2.5 mg by mouth 2 (two) times daily. 04/15/22  Yes [provider]  FeFum-FePoly-FA-B Cmp-C-Biot (INTEGRA PLUS) CAPS Take 1 capsule by mouth every morning. 04/05/22  Yes Heilingoetter, Cassandra L, PA-C  insulin aspart (NOVOLOG FLEXPEN) 100 UNIT/ML FlexPen Inject 8 Units into the skin 3 (three) times daily with meals. 09/04/21  Yes Mariel Aloe, MD  insulin glargine, 2 Unit Dial, (TOUJEO MAX SOLOSTAR) 300 UNIT/ML Solostar Pen Inject 12  Units into the skin daily. Patient taking differently: Inject 14 Units into the skin at bedtime. 09/04/21  Yes Mariel Aloe, MD  KLOR-CON M20 20 MEQ tablet Take 20 mEq by mouth daily. 02/02/22  Yes [provider]  levothyroxine (SYNTHROID) 88 MCG tablet Take 1 tablet (88 mcg total) by mouth daily before breakfast. 02/08/22  Yes Bonnielee Haff, MD  mirtazapine (REMERON) 15 MG tablet Take 15 mg by mouth at bedtime. 05/01/22  Yes [provider]  pantoprazole (PROTONIX) 40 MG tablet Take 1 tablet (40 mg total) by mouth daily at 6 (six) AM. 02/09/22  Yes Bonnielee Haff, MD  polyethylene glycol (MIRALAX / GLYCOLAX) 17 g packet Take 17 g by mouth daily. 02/09/22  Yes Bonnielee Haff, MD  senna-docusate (SENOKOT-S) 8.6-50 MG tablet Take 2 tablets by mouth at bedtime. 02/08/22  Yes Bonnielee Haff, MD  TIADYLT ER 180 MG 24 hr capsule TAKE 1 CAPSULE BY MOUTH EVERY DAY Patient taking differently: Take 180 mg by mouth daily. 11/27/21  Yes Hilty, Nadean Corwin, MD  apixaban (ELIQUIS) 5 MG TABS tablet Take 1 tablet (5 mg) twice Daily Patient not taking: Reported on 05/02/2022 02/22/22   Deberah Pelton, NP  TRULICITY 4.69 GE/9.5MW SOPN Inject 1.5 mg into the skin once a week. sundys 05/01/22   [provider]      Allergies    Patient has no known allergies.    Review of Systems   Review of Systems  Endocrine: Positive for polydipsia and polyuria.  Neurological:  Positive for weakness.  All other systems reviewed and are negative.  Physical Exam Updated Vital Signs BP (!) 111/46   Pulse (!) 59   Temp 98.3 F (36.8 C) (Oral)   Resp (!) 24   Ht '5\' 2"'$  (1.575 m)   Wt 59.4 kg   SpO2 99%   BMI 23.96 kg/m  Physical Exam Vitals and nursing note reviewed.  Constitutional:      General: She is not in acute distress.    Appearance: Normal appearance.     Comments: Thin, somewhat chronically ill appearing woman laying in bed in no acute distress  HENT:     Head: Normocephalic and  atraumatic.     Mouth/Throat:     Mouth: Mucous membranes are dry.  Eyes:     General:        Right eye: No discharge.        Left eye: No discharge.  Cardiovascular:     Rate and Rhythm: Normal rate and regular rhythm.     Pulses: Normal pulses.     Heart sounds: No murmur heard.    No friction rub. No gallop.  Pulmonary:     Effort: Pulmonary effort is normal.     Breath sounds: Normal breath sounds.  Abdominal:     General: Bowel sounds are normal.     Palpations: Abdomen is soft.  Musculoskeletal:     Comments: Intact strength 5/5 of bilateral lower extremities, some TTP of sacrum and coccyx without stepoff or deformity. No other midline spinal tenderness.  Skin:    General: Skin is warm and dry.     Capillary Refill: Capillary refill takes less than 2 seconds.  Neurological:     Mental Status: She is alert and oriented to person, place, and time.  Psychiatric:        Mood and Affect: Mood normal.        Behavior: Behavior normal.     ED Results / Procedures / Treatments   Labs (all labs ordered are listed, but only abnormal results are displayed) Labs Reviewed  CBC - Abnormal; Notable for the following components:      Result Value   Hemoglobin 11.5 (*)    All other components within normal limits  BASIC METABOLIC PANEL - Abnormal; Notable for the following components:   Sodium 133 (*)    CO2 11 (*)    Glucose, Bld 355 (*)    BUN 24 (*)    Creatinine, Ser 1.51 (*)    Calcium 8.4 (*)    GFR, Estimated 34 (*)    Anion gap 17 (*)    All other components within normal limits  BETA-HYDROXYBUTYRIC ACID - Abnormal; Notable for the following components:   Beta-Hydroxybutyric Acid 6.68 (*)    All other components within normal limits  BLOOD GAS, VENOUS - Abnormal; Notable for the following components:   pH, Ven 7.24 (*)    pCO2, Ven 39 (*)    pO2, Ven <31 (*)    Bicarbonate 16.7 (*)    Acid-base deficit 10.1 (*)    All other components within normal limits  CBG  MONITORING, ED - Abnormal; Notable for the following components:   Glucose-Capillary 481 (*)    All other components within normal limits  CBG MONITORING, ED - Abnormal; Notable for the following components:   Glucose-Capillary 426 (*)    All other components within normal limits  CBG MONITORING, ED - Abnormal; Notable for the following components:   Glucose-Capillary 352 (*)    All other components within normal limits  CBG MONITORING, ED - Abnormal; Notable for the following components:   Glucose-Capillary 269 (*)    All other components within normal limits  CBG MONITORING, ED - Abnormal; Notable for the following components:   Glucose-Capillary 195 (*)    All other components within normal limits  CBG MONITORING, ED - Abnormal; Notable for the following components:   Glucose-Capillary 165 (*)    All other components within normal limits  BETA-HYDROXYBUTYRIC ACID  URINALYSIS, ROUTINE W REFLEX MICROSCOPIC  CBG MONITORING, ED  CBG MONITORING, ED  CBG MONITORING, ED    EKG EKG Interpretation  Date/Time:  Sunday May 02 2022 11:24:05 EDT Ventricular Rate:  71 PR Interval:  220 QRS Duration: 113 QT Interval:  466 QTC Calculation: 507 R Axis:   -34 Text Interpretation: Sinus rhythm Prolonged PR interval Left ventricular hypertrophy ST elevation, consider inferior injury Prolonged QT interval Since last tracing QT has lengthened PR interval is longer Otherwise no significant change Confirmed by Daleen Bo [phone]) on 05/02/2022 2:52:52 PM  Radiology DG Sacrum/Coccyx  Result Date: 05/02/2022 CLINICAL DATA:  Pt complained of tailbone pain after a fall two days ago onto her bottom. Pt reported radiating pain down her right leg. EXAM: SACRUM AND COCCYX - 2+ VIEW COMPARISON:  None Available. FINDINGS: No convincing fracture.  No bone lesion. SI joints are normally spaced and aligned. Soft tissues are unremarkable. IMPRESSION: Negative. Electronically Signed   By: Lajean Manes M.D.    On: 05/02/2022 11:19   CT Cervical Spine Wo Contrast  Result Date: 04/30/2022 CLINICAL DATA:  Status post fall. EXAM: CT CERVICAL SPINE WITHOUT CONTRAST TECHNIQUE: Multidetector CT imaging of the cervical spine was performed without intravenous contrast. Multiplanar CT image reconstructions were also generated. RADIATION DOSE REDUCTION: This exam was performed according to the departmental dose-optimization program which includes automated exposure control, adjustment of the mA and/or kV according to patient size and/or use of iterative reconstruction technique. COMPARISON:  None Available. FINDINGS: Alignment: There is mild straightening of the normal cervical spine lordosis. Approximately 1 mm anterolisthesis of the C4 vertebral body is noted on C5. Skull base and vertebrae: No acute fracture. No primary bone lesion or focal pathologic process. Soft tissues and spinal canal: No prevertebral fluid or swelling. No visible canal hematoma. Disc levels: Marked severity endplate sclerosis is seen at the levels of C3-C4, C5-C6 and C6-C7. Marked severity intervertebral disc space narrowing is seen at C3-C4, C5-C6 and C6-C7. Bilateral marked severity multilevel facet joint hypertrophy is noted. Upper chest: Negative. Other: None. IMPRESSION: 1. Marked severity multilevel degenerative changes without evidence of an acute fracture or subluxation. Electronically Signed   By: Virgina Norfolk M.D.   On: 04/30/2022 23:00   CT Head Wo Contrast  Result Date: 04/30/2022 CLINICAL DATA:  Status post fall. EXAM: CT HEAD WITHOUT CONTRAST TECHNIQUE: Contiguous axial images were obtained from the base of the skull through the vertex without intravenous contrast. RADIATION DOSE REDUCTION: This exam was performed according to the departmental dose-optimization program which includes automated exposure control, adjustment of the mA and/or kV according to patient size and/or use of iterative reconstruction technique. COMPARISON:   October 02, 2021 FINDINGS: Brain: There is mild cerebral atrophy with widening of the extra-axial spaces and ventricular dilatation. There are areas of decreased attenuation within the white matter tracts of the supratentorial brain, consistent with microvascular disease changes. 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 Vascular: No hyperdense vessel or unexpected calcification. Skull: Normal. Negative for fracture or focal  lesion. Sinuses/Orbits: No acute finding. Other: None. IMPRESSION: 1. No acute intracranial abnormality. 2. Generalized cerebral atrophy with chronic white matter small vessel ischemic changes. Electronically Signed   By: Virgina Norfolk M.D.   On: 04/30/2022 22:57    Procedures Procedures    Medications Ordered in ED Medications  insulin regular, human (MYXREDLIN) 100 units/ 100 mL infusion (0.5 Units/hr Intravenous Rate/Dose Change 05/02/22 1753)  lactated ringers infusion (has no administration in time range)  dextrose 5 % in lactated ringers infusion ( Intravenous New Bag/Given 05/02/22 1821)  dextrose 50 % solution 0-50 mL (has no administration in time range)  potassium chloride 10 mEq in 100 mL IVPB (10 mEq Intravenous New Bag/Given 05/02/22 1754)  morphine (PF) 2 MG/ML injection 2 mg (2 mg Intravenous Given 05/02/22 1129)  sodium chloride 0.9 % bolus 1,500 mL (0 mLs Intravenous Stopped 05/02/22 1813)  insulin aspart (novoLOG) injection 8 Units (8 Units Subcutaneous Given 05/02/22 1226)  lactated ringers bolus 1,188 mL (0 mLs Intravenous Stopped 05/02/22 1814)    ED Course/ Medical Decision Making/ A&P                           Medical Decision Making Amount and/or Complexity of Data Reviewed Labs: ordered. Radiology: ordered.  Risk Prescription drug management. Decision regarding hospitalization.   This patient is a 87 y.o. female who presents to the ED for concern of hyperglycemia, sequelae from fall two days ago, this involves an  extensive number of treatment options, and is a complaint that carries with it a high risk of complications and morbidity. The emergent differential diagnosis prior to evaluation includes, but is not limited to,  DKA, HHS, hyperglycemia with acidosis, occult fracture or dislocation from previous fall vs other .   This is not an exhaustive differential.   Past Medical History / Co-morbidities / Social History: insulin dependent DM2, HLD, CKD  Additional history: Chart reviewed. Pertinent results include: reviewed imaging from recent ed visit for fall 2 days prior to arrival  Physical Exam: Physical exam performed. The pertinent findings include: thin, dry appearing patient, some evidence of chronic illness and advanced age but intact mental status and no TTP of abdomen or extremities  Lab Tests: I ordered, and personally interpreted labs.  The pertinent results include:  **due to repeat hemolysis, patient's BMP and VBG were not obtained until several hours into her workup despite repeated attempts. We were going to proceed to istat chem 8 for expedient results when bloodwork finally resulted** CBC revealed mild stable to slightly improved anemia with HGB of 11.5. BMP reveals pseudohyponatremia with sodium of 133 on read. Patient with likely low true potassium, on lab value K of 4.2. We will replete during insulin administration. Bicarb shows severe acidosis at 11, glucose 355 by time of bmp, was high 400 on arrival, 426 on first recheck. VBG reveals acidosis with pH of 7.24. BHP elevated at 6.68. once labwork was finalized patient was rapidly started on DKA protocol and endotool   Imaging Studies: I ordered imaging studies including plain film sacrum / coccyx. I independently visualized and interpreted imaging which showed no fractures or other traumatic injury. I agree with the radiologist interpretation.   Cardiac Monitoring:  The patient was maintained on a cardiac monitor.  My attending  physician Dr. Eulis Foster viewed and interpreted the cardiac monitored which showed an underlying rhythm of: NSR with prolonged QT and PR interval from last tracing. I agree with this  interpretation.   Medications: I ordered medication including insulin, fluids, potassium, morphine, for DKA, potassium repletion in context of intracellular shift, and pain control. Reevaluation of the patient after these medicines showed that the patient  patient will need close re-evaluation for repeat sugar readings, as well as closing of anion gap . I have reviewed the patients home medicines and have made adjustments as needed.  3:30p Care of [name] transferred to [facility] and Dr. Almyra Free at the end of my shift as the patient will require reassessment once labs/imaging have resulted. Patient presentation, ED course, and plan of care discussed with review of all pertinent labs and imaging. Please see his/her note for further details regarding further ED course and disposition. Plan at time of handoff is admit for DKA, begin endotool. This may be altered or completely changed at the discretion of the oncoming team pending results of further workup.   I discussed this case with my attending physician Dr. Eulis Foster who cosigned this note including patient's presenting symptoms, physical exam, and planned diagnostics and interventions. Attending physician stated agreement with plan or made changes to plan which were implemented.    Final Clinical Impression(s) / ED Diagnoses Final diagnoses:  Diabetic ketoacidosis without coma associated with type 2 diabetes mellitus [facility])    Rx / DC Orders ED Discharge Orders     None         Dorien Chihuahua 05/02/22 Ivar Bury, MD 05/03/22 1630

## 2022-05-02 NOTE — ED Notes (Signed)
Patient transported to X-ray 

## 2022-05-02 NOTE — ED Triage Notes (Signed)
Pt BIB EMS from home, c/o high blood sugar. Stated home meter has been reading high x 2 days. Pt call MD who recommended ED for eval, EMS placed 20 gauge LAC. Alert and oriented x4, denies pain  BP 150/70 P 72 RR 18 CBG "high"

## 2022-05-02 NOTE — H&P (Signed)
History and Physical    Patient: [name] SWN:[id number] DOB: 06-27-1935 DOA: 05/02/2022 DOS: the patient was seen and examined on 05/02/2022 PCP: Deland Pretty, MD  Patient coming from: Home  Chief Complaint:  Chief Complaint  Patient presents with   Hyperglycemia   HPI: [name] is a 87 y.o. female with medical history significant of DM2, PAF, HTN, hypothyroidism. Presenting with 3 days of elevated sugars. She has been compliant on her DM regimen. She has not had any F/N/V/D. She checked her sugars and they would read "high". So she called her doctor and had her insulin supplement raised. However, it didn't make any difference. Since her glucometer was still reading "high" today, she decided to come to the ED. Of note, she was in the ED d/t a fall she suffered while working in her garden. She had staples placed for a laceration to the back of her head. She reports some tenderness around her tailbone. Otherwise, she denies complaints.    Review of Systems: As mentioned in the history of present illness. All other systems reviewed and are negative. Past Medical History:  Diagnosis Date   Colon polyp    Diabetes mellitus    Hyperlipidemia    Hypertension    Poor appetite    Thyroid disease    Past Surgical History:  Procedure Laterality Date   BIOPSY  02/07/2022   Procedure: BIOPSY;  Surgeon: Ladene Artist, MD;  Location: [facility] ENDOSCOPY;  Service: Gastroenterology;;   BREAST EXCISIONAL BIOPSY Right    CARDIOVASCULAR STRESS TEST  07/15/2006   Normal scan, no ECG changes   COLONOSCOPY WITH PROPOFOL N/A 02/07/2022   Procedure: COLONOSCOPY WITH PROPOFOL;  Surgeon: Ladene Artist, MD;  Location: [place];  Service: Gastroenterology;  Laterality: N/A;   ESOPHAGOGASTRODUODENOSCOPY (EGD) WITH PROPOFOL N/A 02/07/2022   Procedure: ESOPHAGOGASTRODUODENOSCOPY (EGD) WITH PROPOFOL;  Surgeon: Ladene Artist, MD;  Location: [place];  Service: Gastroenterology;  Laterality: N/A;    TRANSTHORACIC ECHOCARDIOGRAM  01/08/2011   EF 60-65%, moderae mitral regurg, LA mild-moderately dilated,   Social History:  reports that she quit smoking about 18 years ago. Her smoking use included cigarettes. She has never used smokeless tobacco. She reports that she does not drink alcohol and does not use drugs.  No Known Allergies  Family History  Problem Relation Age of Onset   Diabetes Mother    Hypertension Father    Hyperlipidemia Father    Diabetes Sister    Hypertension Sister    Hyperlipidemia Sister    Hypertension Brother    Stroke Sister    Hypertension Sister    Diabetes Sister    Cancer Daughter    Heart failure Child    Breast cancer Neg Hx     Prior to Admission medications   Medication Sig Start Date End Date Taking? Authorizing Provider  amiodarone (PACERONE) 200 MG tablet Take 1 tablet (200 mg total) by mouth daily. Direction change 02/17/22   Hilty, Nadean Corwin, MD  apixaban (ELIQUIS) 5 MG TABS tablet Take 1 tablet (5 mg) twice Daily 02/22/22   Deberah Pelton, NP  atorvastatin (LIPITOR) 20 MG tablet Take 20 mg by mouth daily. 02/01/20   [provider]  FeFum-FePoly-FA-B Cmp-C-Biot (INTEGRA PLUS) CAPS Take 1 capsule by mouth every morning. 04/05/22   Heilingoetter, Cassandra L, PA-C  insulin aspart (NOVOLOG FLEXPEN) 100 UNIT/ML FlexPen Inject 8 Units into the skin 3 (three) times daily with meals. 09/04/21   Mariel Aloe, MD  insulin  glargine, 2 Unit Dial, (TOUJEO MAX SOLOSTAR) 300 UNIT/ML Solostar Pen Inject 12 Units into the skin daily. 09/04/21   Mariel Aloe, MD  KLOR-CON M20 20 MEQ tablet Take 1 tablet by mouth daily. 02/02/22   [provider]  levothyroxine (SYNTHROID) 88 MCG tablet Take 1 tablet (88 mcg total) by mouth daily before breakfast. 02/08/22   Bonnielee Haff, MD  pantoprazole (PROTONIX) 40 MG tablet Take 1 tablet (40 mg total) by mouth daily at 6 (six) AM. 02/09/22   Bonnielee Haff, MD  polyethylene glycol (MIRALAX / GLYCOLAX)  17 g packet Take 17 g by mouth daily. 02/09/22   Bonnielee Haff, MD  senna-docusate (SENOKOT-S) 8.6-50 MG tablet Take 2 tablets by mouth at bedtime. 02/08/22   Bonnielee Haff, MD  TIADYLT ER 180 MG 24 hr capsule TAKE 1 CAPSULE BY MOUTH EVERY DAY Patient taking differently: Take 180 mg by mouth daily. 11/27/21   Pixie Casino, MD    Physical Exam: Vitals:   05/02/22 1430 05/02/22 1443 05/02/22 1445 05/02/22 1500  BP: (!) 110/42  (!) 107/49 (!) 111/48  Pulse: 60  (!) 59 (!) 58  Resp: (!) '26  17 19  '$ Temp:  98.3 F (36.8 C)    TempSrc:  Oral    SpO2: 99%  98% 98%  Weight:      Height:       General: 87 y.o. female resting in bed in NAD Eyes: PERRL, normal sclera Head: staples in place posterior head ENMT: Nares patent w/o discharge, orophaynx clear, dentition normal, ears w/o discharge/lesions/ulcers Neck: Supple, trachea midline Cardiovascular: RRR, +S1, S2, no m/g/r, equal pulses throughout Respiratory: CTABL, no w/r/r, normal WOB GI: BS+, NDNT, no masses noted, no organomegaly noted MSK: No e/c/c Neuro: A&O x 3, no focal deficits Psyc: Appropriate interaction and affect, calm/cooperative  Data Reviewed:  Lab Results  Component Value Date   NA 133 (L) 05/02/2022   K 4.2 05/02/2022   CO2 11 (L) 05/02/2022   GLUCOSE 355 (H) 05/02/2022   BUN 24 (H) 05/02/2022   CREATININE 1.51 (H) 05/02/2022   CALCIUM 8.4 (L) 05/02/2022   GFRNONAA 34 (L) 05/02/2022   Lab Results  Component Value Date   WBC 8.5 05/02/2022   HGB 11.5 (L) 05/02/2022   HCT 37.3 05/02/2022   MCV 91.6 05/02/2022   PLT 216 05/02/2022   XR sacrum/coccyx Negative.  Assessment and Plan: DKA DM2 uncontrolled     - place in obs, SDU     - Endotool: insulin, fluids, glucose checks     - NPO except for meds/non-caloric fluids     - A1c     - DM coordinator  HTN     - continue home regimen when confirmed  HLD     - continue home regimen when confirmed  Hypothyroidism     - continue home regimen when  confirmed  PAF     - continue home regimen when confirmed  AKI on CKD3b     - renal US     - fluids, watch nephrotoxins  Advance Care Planning:   Code Status: FULL  Consults: None  Family Communication: None at bedside  Severity of Illness: The appropriate patient status for this patient is OBSERVATION. Observation status is judged to be reasonable and necessary in order to provide the required intensity of service to ensure the patient's safety. The patient's presenting symptoms, physical exam findings, and initial radiographic and laboratory data in the context of their medical condition  is felt to place them at decreased risk for further clinical deterioration. Furthermore, it is anticipated that the patient will be medically stable for discharge from the hospital within 2 midnights of admission.   Author: Jonnie Finner, DO 05/02/2022 3:57 PM  For on call review [email or web address].

## 2022-05-02 NOTE — ED Provider Notes (Signed)
Care assumed from previous provider PA-C Prosperi. Please see note for further details.  In short, patient is an 87 year old female presenting today with hyperglycemia.  Labs just came back revealing of DKA.  Will require Endo tool and admission.  Remainder of hyperglycemia labs pending.  Endocrinologist: Upped meds overnight but still hyperglycemic  Physical Exam  BP (!) 108/45   Pulse 64   Temp 98.3 F (36.8 C) (Oral)   Resp 16   Ht '5\' 2"'$  (1.575 m)   Wt 59.4 kg   SpO2 98%   BMI 23.96 kg/m   Physical Exam Vitals and nursing note reviewed.  Constitutional:      Appearance: Normal appearance.  HENT:     Head: Normocephalic and atraumatic.  Eyes:     General: No scleral icterus.    Conjunctiva/sclera: Conjunctivae normal.  Pulmonary:     Effort: Pulmonary effort is normal. No respiratory distress.  Skin:    Findings: No rash.  Neurological:     Mental Status: She is alert.  Psychiatric:        Mood and Affect: Mood normal.     Procedures  Procedures  ED Course / MDM    Medical Decision Making Amount and/or Complexity of Data Reviewed Labs: ordered. Radiology: ordered.  Risk Prescription drug management. Decision regarding hospitalization.   Lab work reviewed and interpreted by me. Gap 17, CO2 11.  Hospitalist paged for admission for DKA. Dr. Marylyn Ishihara accepting.    Darliss Ridgel 05/02/22 1655    Luna Fuse, MD 05/04/22 2042

## 2022-05-02 NOTE — ED Provider Notes (Signed)
  Face-to-face evaluation   History: She presents for evaluation of high blood sugar, found on home testing.  She fell 3 days ago was seen and treated and had staples in her head.  No recent weakness or dizziness.  She is hungry currently.  Physical exam: Alert elderly female conversant and cooperative.  No dysarthria or aphasia.  Abdomen soft nontender.  Normal range of motion arms and legs bilaterally.  MDM: Evaluation for  Chief Complaint  Patient presents with   Hyperglycemia     Patient presenting with high blood sugar.  Prior labs indicate persistent hyperglycemia.  Treated with IV fluids in the ED.  Anion gap elevated, CO2 low, creatinine higher than baseline.  Patient requires treatment and admission.  Medical screening examination/treatment/procedure(s) were conducted as a shared visit with non-physician practitioner(s) and myself.  I personally evaluated the patient during the encounter    Daleen Bo, MD 05/03/22 1630

## 2022-05-03 DIAGNOSIS — Z794 Long term (current) use of insulin: Secondary | ICD-10-CM

## 2022-05-03 DIAGNOSIS — W19XXXD Unspecified fall, subsequent encounter: Secondary | ICD-10-CM | POA: Diagnosis not present

## 2022-05-03 DIAGNOSIS — I959 Hypotension, unspecified: Secondary | ICD-10-CM | POA: Diagnosis not present

## 2022-05-03 DIAGNOSIS — I10 Essential (primary) hypertension: Secondary | ICD-10-CM | POA: Diagnosis not present

## 2022-05-03 DIAGNOSIS — I480 Paroxysmal atrial fibrillation: Secondary | ICD-10-CM | POA: Diagnosis not present

## 2022-05-03 DIAGNOSIS — E1165 Type 2 diabetes mellitus with hyperglycemia: Secondary | ICD-10-CM

## 2022-05-03 DIAGNOSIS — E1110 Type 2 diabetes mellitus with ketoacidosis without coma: Secondary | ICD-10-CM | POA: Diagnosis not present

## 2022-05-03 DIAGNOSIS — I440 Atrioventricular block, first degree: Secondary | ICD-10-CM | POA: Diagnosis not present

## 2022-05-03 DIAGNOSIS — N179 Acute kidney failure, unspecified: Secondary | ICD-10-CM

## 2022-05-03 DIAGNOSIS — N1831 Chronic kidney disease, stage 3a: Secondary | ICD-10-CM

## 2022-05-03 DIAGNOSIS — S0101XD Laceration without foreign body of scalp, subsequent encounter: Secondary | ICD-10-CM

## 2022-05-03 DIAGNOSIS — R2681 Unsteadiness on feet: Secondary | ICD-10-CM | POA: Diagnosis not present

## 2022-05-03 DIAGNOSIS — Y92009 Unspecified place in unspecified non-institutional (private) residence as the place of occurrence of the external cause: Secondary | ICD-10-CM

## 2022-05-03 DIAGNOSIS — S0101XA Laceration without foreign body of scalp, initial encounter: Secondary | ICD-10-CM

## 2022-05-03 LAB — BASIC METABOLIC PANEL
Anion gap: 7 (ref 5–15)
Anion gap: 5 (ref 5–15)
BUN: 20 mg/dL (ref 8–23)
BUN: 18 mg/dL (ref 8–23)
CO2: 21 mmol/L — ABNORMAL LOW (ref 22–32)
CO2: 21 mmol/L — ABNORMAL LOW (ref 22–32)
Calcium: 8.5 mg/dL — ABNORMAL LOW (ref 8.9–10.3)
Calcium: 8.4 mg/dL — ABNORMAL LOW (ref 8.9–10.3)
Chloride: 108 mmol/L (ref 98–111)
Chloride: 110 mmol/L (ref 98–111)
Creatinine, Ser: 1.1 mg/dL — ABNORMAL HIGH (ref 0.44–1.00)
Creatinine, Ser: 1.13 mg/dL — ABNORMAL HIGH (ref 0.44–1.00)
GFR, Estimated: 50 mL/min — ABNORMAL LOW (ref >60)
GFR, Estimated: 48 mL/min — ABNORMAL LOW (ref >60)
Glucose, Bld: 91 mg/dL (ref 70–99)
Glucose, Bld: 154 mg/dL — ABNORMAL HIGH (ref 70–99)
Potassium: 4 mmol/L (ref 3.5–5.1)
Potassium: 3.6 mmol/L (ref 3.5–5.1)
Sodium: 136 mmol/L (ref 135–145)
Sodium: 136 mmol/L (ref 135–145)

## 2022-05-03 LAB — GLUCOSE, CAPILLARY
Glucose-Capillary: 107 mg/dL — ABNORMAL HIGH (ref 70–99)
Glucose-Capillary: 133 mg/dL — ABNORMAL HIGH (ref 70–99)
Glucose-Capillary: 142 mg/dL — ABNORMAL HIGH (ref 70–99)
Glucose-Capillary: 146 mg/dL — ABNORMAL HIGH (ref 70–99)
Glucose-Capillary: 148 mg/dL — ABNORMAL HIGH (ref 70–99)
Glucose-Capillary: 150 mg/dL — ABNORMAL HIGH (ref 70–99)
Glucose-Capillary: 151 mg/dL — ABNORMAL HIGH (ref 70–99)
Glucose-Capillary: 169 mg/dL — ABNORMAL HIGH (ref 70–99)
Glucose-Capillary: 189 mg/dL — ABNORMAL HIGH (ref 70–99)
Glucose-Capillary: 217 mg/dL — ABNORMAL HIGH (ref 70–99)
Glucose-Capillary: 441 mg/dL — ABNORMAL HIGH (ref 70–99)
Glucose-Capillary: 73 mg/dL (ref 70–99)
Glucose-Capillary: 79 mg/dL (ref 70–99)

## 2022-05-03 LAB — MAGNESIUM: Magnesium: 2 mg/dL (ref 1.7–2.4)

## 2022-05-03 LAB — GLUCOSE, RANDOM: Glucose, Bld: 241 mg/dL — ABNORMAL HIGH (ref 70–99)

## 2022-05-03 LAB — BETA-HYDROXYBUTYRIC ACID: Beta-Hydroxybutyric Acid: 0.77 mmol/L — ABNORMAL HIGH (ref 0.05–0.27)

## 2022-05-03 LAB — HEMOGLOBIN A1C
Hgb A1c MFr Bld: 12.2 % — ABNORMAL HIGH (ref 4.8–5.6)
Mean Plasma Glucose: 303.44 mg/dL

## 2022-05-03 LAB — PHOSPHORUS: Phosphorus: 2.1 mg/dL — ABNORMAL LOW (ref 2.5–4.6)

## 2022-05-03 MED ORDER — INSULIN ASPART 100 UNIT/ML IJ SOLN
0.0000 [IU] | Freq: Every day | INTRAMUSCULAR | Status: DC
  Administered 2022-05-03: 2 [IU] via SUBCUTANEOUS

## 2022-05-03 MED ORDER — POTASSIUM CHLORIDE 2 MEQ/ML IV SOLN
INTRAVENOUS | Status: DC
  Filled 2022-05-03 (×3): qty 1000

## 2022-05-03 MED ORDER — INSULIN GLARGINE-YFGN 100 UNIT/ML ~~LOC~~ SOLN
15.0000 [IU] | Freq: Every day | SUBCUTANEOUS | Status: DC
  Administered 2022-05-03: 15 [IU] via SUBCUTANEOUS
  Filled 2022-05-03: qty 0.15

## 2022-05-03 MED ORDER — POTASSIUM PHOSPHATES 15 MMOLE/5ML IV SOLN
30.0000 mmol | Freq: Once | INTRAVENOUS | Status: AC
  Administered 2022-05-03: 30 mmol via INTRAVENOUS
  Filled 2022-05-03: qty 10

## 2022-05-03 MED ORDER — INSULIN ASPART 100 UNIT/ML IJ SOLN: 3.0000 [IU] | Freq: Three times a day (TID) | INTRAMUSCULAR | Status: DC

## 2022-05-03 MED ORDER — INSULIN ASPART 100 UNIT/ML IJ SOLN
7.0000 [IU] | Freq: Three times a day (TID) | INTRAMUSCULAR | Status: DC
  Administered 2022-05-03: 7 [IU] via SUBCUTANEOUS

## 2022-05-03 MED ORDER — INSULIN ASPART 100 UNIT/ML IJ SOLN
0.0000 [IU] | Freq: Three times a day (TID) | INTRAMUSCULAR | Status: DC
  Administered 2022-05-03 (×2): 3 [IU] via SUBCUTANEOUS
  Administered 2022-05-03: 15 [IU] via SUBCUTANEOUS
  Administered 2022-05-04: 8 [IU] via SUBCUTANEOUS

## 2022-05-03 MED ORDER — AMIODARONE HCL 200 MG PO TABS
200.0000 mg | ORAL_TABLET | Freq: Every day | ORAL | Status: DC
  Administered 2022-05-04: 200 mg via ORAL
  Filled 2022-05-03: qty 1

## 2022-05-03 MED ORDER — DILTIAZEM HCL ER COATED BEADS 120 MG PO CP24
120.0000 mg | ORAL_CAPSULE | Freq: Every day | ORAL | Status: DC
  Administered 2022-05-04: 120 mg via ORAL
  Filled 2022-05-03: qty 1

## 2022-05-03 MED ORDER — INSULIN GLARGINE-YFGN 100 UNIT/ML ~~LOC~~ SOLN
15.0000 [IU] | Freq: Two times a day (BID) | SUBCUTANEOUS | Status: DC
  Administered 2022-05-03: 15 [IU] via SUBCUTANEOUS
  Filled 2022-05-03 (×2): qty 0.15

## 2022-05-03 NOTE — Evaluation (Signed)
Physical Therapy Evaluation Patient Details Name: [name] MRN: [id number] DOB: 1935-02-04 Today's Date: 05/03/2022  History of Present Illness  87 year old female with PMH of IDDM-2, unsteady gait, paroxysmal A-fib on Eliquis, HTN, hypothyroidism and CKD-3A brought to ED by EMS due to elevated blood glucose, and admitted for DKA and AKI.  Clinical Impression  Pt admitted with above diagnosis.  Pt with very unsteady gait, requiring assist throughout distance for balance and safety. Will follow in acute setting. Pt reports I/mod I at her baseline.  May need incr supervision, assist at d/c as well as  HHPT. Has DME    Pt currently with functional limitations due to the deficits listed below (see PT Problem List). Pt will benefit from skilled PT to increase their independence and safety with mobility to allow discharge to the venue listed below.          Recommendations for follow up therapy are one component of a multi-disciplinary discharge planning process, led by the attending physician.  Recommendations may be updated based on patient status, additional functional criteria and insurance authorization.  Follow Up Recommendations Home health PT      Assistance Recommended at Discharge Intermittent Supervision/Assistance  Patient can return home with the following       Equipment Recommendations None recommended by PT  Recommendations for Other Services       Functional Status Assessment Patient has had a recent decline in their functional status and demonstrates the ability to make significant improvements in function in a reasonable and predictable amount of time.     Precautions / Restrictions Precautions Precautions: Fall Restrictions Weight Bearing Restrictions: No      Mobility  Bed Mobility Overal bed mobility: Needs Assistance Bed Mobility: Supine to Sit     Supine to sit: Mod assist     General bed mobility comments: incr time, assist wtih trunk, bed pad  used to scoot to EOB    Transfers Overall transfer level: Needs assistance Equipment used: Rolling walker (2 wheels) Transfers: Sit to/from Stand Sit to Stand: Min assist, Min guard, Mod assist           General transfer comment: mod assist to rise from bed lowest setting.  min assist from higher surface to simulate home environment    Ambulation/Gait Ambulation/Gait assistance: Min assist Gait Distance (Feet): 180 Feet Assistive device: Rolling walker (2 wheels) Gait Pattern/deviations: Step-through pattern, Decreased stride length, Trunk flexed       General Gait Details: R lateral trunk shift throughout, multi-modal cues for trunk extension, to keep feet inside RW.  pt stability improved with distance and near end of distance pt with festinating gait/anterior wt shift, assist needed tp prevent anterior LOB  Stairs            Wheelchair Mobility    Modified Rankin (Stroke Patients Only)       Balance Overall balance assessment: Needs assistance, History of Falls Sitting-balance support: No upper extremity supported, Feet supported Sitting balance-Leahy Scale: Fair     Standing balance support: No upper extremity supported, Reliant on assistive device for balance, During functional activity, Bilateral upper extremity supported Standing balance-Leahy Scale: Poor Standing balance comment: assist to maintain static  standing with and without UE support; reliant on UE support and external assist for dynamic activity                             Pertinent Vitals/Pain  Home Living Family/patient expects to be discharged to:: Private residence Living Arrangements: Alone Available Help at Discharge: Family;Available PRN/intermittently Type of Home: House Home Access: Stairs to enter   Entrance Stairs-Number of Steps: 1 to 2   Home Layout: Laundry or work area in basement;Able to live on main level with bedroom/bathroom Home Equipment: Chartered certified accountant (2 wheels);Cane - single point      Prior Function Prior Level of Function : Independent/Modified Independent             Mobility Comments: Still driving. does not amb with device       Hand Dominance        Extremity/Trunk Assessment   Upper Extremity Assessment Upper Extremity Assessment: Defer to OT evaluation    Lower Extremity Assessment Lower Extremity Assessment: Generalized weakness    Cervical / Trunk Assessment Cervical / Trunk Assessment: Other exceptions Cervical / Trunk Exceptions: R lateral shift (pt attributes to pain)  Communication   Communication: No difficulties  Cognition Arousal/Alertness: Awake/alert Behavior During Therapy: WFL for tasks assessed/performed Overall Cognitive Status: Within Functional Limits for tasks assessed                                          General Comments      Exercises     Assessment/Plan    PT Assessment Patient needs continued PT services  PT Problem List Decreased strength;Decreased mobility;Decreased activity tolerance;Decreased balance;Decreased knowledge of use of DME;Pain       PT Treatment Interventions DME instruction;Therapeutic exercise;Gait training;Balance training;Stair training;Functional mobility training;Therapeutic activities;Patient/family education    PT Goals (Current goals can be found in the Care Plan section)  Acute Rehab PT Goals Patient Stated Goal: home PT Goal Formulation: With patient Time For Goal Achievement: 05/17/22 Potential to Achieve Goals: Good    Frequency Min 3X/week     Co-evaluation               AM-PAC PT "6 Clicks" Mobility  Outcome Measure Help needed turning from your back to your side while in a flat bed without using bedrails?: A Little Help needed moving from lying on your back to sitting on the side of a flat bed without using bedrails?: A Little Help needed moving to and from a bed to a chair (including a  wheelchair)?: A Little Help needed standing up from a chair using your arms (e.g., wheelchair or bedside chair)?: A Little Help needed to walk in hospital room?: A Little Help needed climbing 3-5 steps with a railing? : A Lot 6 Click Score: 17    End of Session Equipment Utilized During Treatment: Gait belt Activity Tolerance: Patient tolerated treatment well Patient left: with call bell/phone within reach;in chair;with chair alarm set;with family/visitor present Nurse Communication: Mobility status PT Visit Diagnosis: Other abnormalities of gait and mobility (R26.89);Difficulty in walking, not elsewhere classified (R26.2)    Time: 1439-1510 PT Time Calculation (min) (ACUTE ONLY): 31 min   Charges:   PT Evaluation $PT Eval Low Complexity: 1 Low PT Treatments $Gait Training: 8-22 mins        Baxter Flattery, PT  Acute Rehab Dept [facility]) [phone]  [facility] Weekend Pager [facility] only)  [phone]  05/03/2022   [facility] 05/03/2022, 3:26 PM

## 2022-05-03 NOTE — Progress Notes (Signed)
Pt from ED; assessed, oriented to room. Pt alert and looks comfortable in bed.

## 2022-05-03 NOTE — Plan of Care (Addendum)
  Problem: Pain Managment: Goal: General experience of comfort will improve Outcome: Progressing   Problem: Safety: Goal: Ability to remain free from injury will improve Outcome: Progressing   Problem: Skin Integrity: Goal: Risk for impaired skin integrity will decrease Outcome: Progressing   Problem: Education: Goal: Ability to describe self-care measures that may prevent or decrease complications (Diabetes Survival Skills Education) will improve Outcome: Progressing   Problem: Coping: Goal: Ability to adjust to condition or change in health will improve Outcome: Progressing   Problem: Fluid Volume: Goal: Ability to maintain a balanced intake and output will improve Outcome: Progressing   Problem: Health Behavior/Discharge Planning: Goal: Ability to identify and utilize available resources and services will improve Outcome: Progressing Goal: Ability to manage health-related needs will improve Outcome: Progressing   Problem: Metabolic: Goal: Ability to maintain appropriate glucose levels will improve Outcome: Progressing   Problem: Nutritional: Goal: Maintenance of adequate nutrition will improve Outcome: Progressing Goal: Progress toward achieving an optimal weight will improve Outcome: Progressing   Problem: Skin Integrity: Goal: Risk for impaired skin integrity will decrease Outcome: Progressing   Problem: Tissue Perfusion: Goal: Adequacy of tissue perfusion will improve Outcome: Progressing

## 2022-05-03 NOTE — Progress Notes (Signed)
PROGRESS NOTE  [name] XLK:[id number] DOB: 1935-09-17   PCP: Deland Pretty, MD  Patient is from: Home.  Lives alone.  Uses rolling walker at baseline.  DOA: 05/02/2022 LOS: 0  Chief complaints Chief Complaint  Patient presents with   Hyperglycemia     Brief Narrative / Interim history: 87 year old F with PMH of IDDM-2, unsteady gait, paroxysmal A-fib on Eliquis, HTN, hypothyroidism and CKD-3A brought to ED by EMS due to elevated blood glucose, and admitted for DKA and AKI.  Reportedly had elevated blood glucose despite increasing her insulin per recommendation by her her provider, and was advised to go to ED.   In ED, vital stable.  pH 7.24.  CBG 481.  Bicarb 11.  AG 17. Cr 1.51 (baseline about 1.1).  BUN 24.  UA with> 500 glucose and 80 ketones.  BHA 6.68.  EKG sinus rhythm with prolonged PR to 220.  Patient was started on IV fluid and insulin drip, and admitted.  Subjective: Seen and examined earlier this morning.  DKA resolved.  Has no complaints.  Denies chest pain, shortness of breath, cough, GI or acute urinary symptoms.  She reports using 14 units of Toujeo and sliding scale insulin at home.  However, she is not quite clear about her insulin regimen.   Objective: Vitals:   05/03/22 0845 05/03/22 0900 05/03/22 0922 05/03/22 1000  BP:  99/62 (!) 107/47   Pulse:  (!) 51 (!) 54 63  Resp:  '16 13 15  '$ Temp: 98 F (36.7 C)     TempSrc: Oral     SpO2:  94% 99% 100%  Weight:      Height:        Examination:  GENERAL: No apparent distress.  Nontoxic. HEENT: MMM.  Vision and hearing grossly intact.  Scalp laceration with 4 staples in the back of the head. NECK: Supple.  No apparent JVD.  RESP:  No IWOB.  Fair aeration bilaterally. CVS:  RRR. Heart sounds normal.  ABD/GI/GU: BS+. Abd soft, NTND.  MSK/EXT:  Moves extremities. No apparent deformity. No edema.  SKIN: Scalp laceration with 4 staples on the back of the head NEURO: Awake, alert and oriented appropriately.  No  apparent focal neuro deficit. PSYCH: Calm. Normal affect.   Procedures:  None  Microbiology summarized: MRSA PCR screen negative.  Assessment and plan: Principal Problem:   DKA (diabetic ketoacidosis) ([place]) Active Problems:   Type 2 diabetes mellitus ([place])   Essential hypertension   Acute renal failure superimposed on stage 3a chronic kidney disease (HCC)   PAF (paroxysmal atrial fibrillation) ([place])   Hypercholesterolemia   Hypothyroidism   Uncontrolled IDDM-2 with diabetic ketoacidosis and CKD-3A: Last A1c 12.4% in 01/2022.  DKA resolved. Recent Labs  Lab 05/03/22 0425 05/03/22 0543 05/03/22 0644 05/03/22 0848 05/03/22 0948  GLUCAP 150* 146* 142* 107* 151*  -Transition to subcu insulin with Semglee 15 units, SSI-moderate and NovoLog 3 units 3 times daily with meals -Patient is not quite clear about her home insulin regimen.   -Further adjustment as appropriate -Does not seem to be on statin.  Will start unless contraindication. -Appreciate help by diabetic coordinator -Check hemoglobin A1c   Acute on chronic kidney disease: Baseline Cr 1.1-1.2.  AKI resolved. Recent Labs    02/05/22 1449 02/06/22 0751 02/07/22 0142 02/08/22 0617 03/25/22 1944 04/05/22 1322 05/02/22 1329 05/02/22 2027 05/02/22 2348 05/03/22 0426  BUN 16 11 7* '10 23 20 '$ 24* '21 20 18  '$ CREATININE 1.30* 1.08* 0.90 1.19* 1.39* 1.16*  1.51* 1.19* 1.10* 1.13*  -Continue IV fluid  Hyponatremia: Likely due to hyperglycemia.  Resolved.  Paroxysmal atrial fibrillation/first-degree AVB: Now with mild sinus bradycardia and first-degree AVB -Continue home amiodarone and Eliquis. -Decrease Cardizem CD to 120 mg daily.  Takes 180 mg daily.  Hypertension in patient with history of essential hypertension: BP 107/47.  Does not seem to be symptomatic from this.  Bradycardic to 50s.  -Continue IV fluid -Decreased Cardizem CD  Hypothyroidism -Check TSH -Continue home Synthroid  Fall at home/scalp  laceration/unsteady gait: Subsequent encounter.  Patient fell and hit the back of her head on the gravel driveway.  She thinks she stumbled on a small hill.  She reports history of unsteady gait.  She has a walker but did not use her walker when she fell.  She had a small laceration that was repaired with 4 staples.  No prodromes leading to the fall.  No LOC.  CT head without acute finding.  X-ray of sacrum and coccyx without significant finding.   -Staple removal in 3 to 4 days -PT/OT eval   Body mass index is 23.96 kg/m.          DVT prophylaxis:  apixaban (ELIQUIS) tablet 2.5 mg Start: 05/02/22 2200 apixaban (ELIQUIS) tablet 2.5 mg  Code Status: Full code Family Communication: None at bedside Level of care: Med-Surg Status is: Observation The patient will require care spanning > 2 midnights and should be moved to inpatient because: Due to hypotension, diabetic ketoacidosis   Final disposition: TBD. Consultants:  Diabetic coronary  Sch Meds:  Scheduled Meds:  amiodarone  200 mg Oral BID   apixaban  2.5 mg Oral BID   Chlorhexidine Gluconate Cloth  6 each Topical Daily   diltiazem  180 mg Oral Daily   insulin aspart  0-15 Units Subcutaneous TID WC   insulin aspart  0-5 Units Subcutaneous QHS   insulin aspart  3 Units Subcutaneous TID WC   insulin glargine-yfgn  15 Units Subcutaneous Daily   levothyroxine  88 mcg Oral Q0600   mirtazapine  15 mg Oral QHS   pantoprazole  40 mg Oral Q0600   polyethylene glycol  17 g Oral Daily   senna-docusate  2 tablet Oral QHS   Continuous Infusions:  dextrose 5% lactated ringers Stopped (05/03/22 0951)   insulin Stopped (05/03/22 0903)   lactated ringers 1,000 mL with potassium chloride 20 mEq infusion 125 mL/hr at 05/03/22 1053   lactated ringers Stopped (05/02/22 2013)   potassium PHOSPHATE IVPB (in mmol) 85 mL/hr at 05/03/22 1053   PRN Meds:.dextrose, mouth rinse  Antimicrobials: Anti-infectives (From admission, onward)    None         I have personally reviewed the following labs and images: CBC: Recent Labs  Lab 05/02/22 1045  WBC 8.5  HGB 11.5*  HCT 37.3  MCV 91.6  PLT 216   BMP &GFR Recent Labs  Lab 05/02/22 1329 05/02/22 2027 05/02/22 2348 05/03/22 0426 05/03/22 0714  NA 133* 135 136 136  --   K 4.2 4.6 4.0 3.6  --   CL 105 108 108 110  --   CO2 11* 18* 21* 21*  --   GLUCOSE 355* 153* 91 154*  --   BUN 24* '21 20 18  '$ --   CREATININE 1.51* 1.19* 1.10* 1.13*  --   CALCIUM 8.4* 8.7* 8.5* 8.4*  --   MG  --   --   --   --  2.0  PHOS  --   --   --   --  2.1*   Estimated Creatinine Clearance: 29.3 mL/min (A) (by C-G formula based on SCr of 1.13 mg/dL (H)). Liver & Pancreas: No results for input(s): "AST", "ALT", "ALKPHOS", "BILITOT", "PROT", "ALBUMIN" in the last 168 hours. No results for input(s): "LIPASE", "AMYLASE" in the last 168 hours. No results for input(s): "AMMONIA" in the last 168 hours. Diabetic: No results for input(s): "HGBA1C" in the last 72 hours. Recent Labs  Lab 05/03/22 0425 05/03/22 0543 05/03/22 0644 05/03/22 0848 05/03/22 0948  GLUCAP 150* 146* 142* 107* 151*   Cardiac Enzymes: No results for input(s): "CKTOTAL", "CKMB", "CKMBINDEX", "TROPONINI" in the last 168 hours. No results for input(s): "PROBNP" in the last 8760 hours. Coagulation Profile: No results for input(s): "INR", "PROTIME" in the last 168 hours. Thyroid Function Tests: No results for input(s): "TSH", "T4TOTAL", "FREET4", "T3FREE", "THYROIDAB" in the last 72 hours. Lipid Profile: No results for input(s): "CHOL", "HDL", "LDLCALC", "TRIG", "CHOLHDL", "LDLDIRECT" in the last 72 hours. Anemia Panel: No results for input(s): "VITAMINB12", "FOLATE", "FERRITIN", "TIBC", "IRON", "RETICCTPCT" in the last 72 hours. Urine analysis:    Component Value Date/Time   COLORURINE YELLOW 05/02/2022 1927   APPEARANCEUR CLEAR 05/02/2022 1927   LABSPEC 1.022 05/02/2022 1927   PHURINE 5.0 05/02/2022 1927    GLUCOSEU >=500 (A) 05/02/2022 1927   HGBUR NEGATIVE 05/02/2022 1927   BILIRUBINUR NEGATIVE 05/02/2022 1927   KETONESUR 80 (A) 05/02/2022 1927   PROTEINUR 30 (A) 05/02/2022 1927   UROBILINOGEN 0.2 03/17/2011 0847   NITRITE NEGATIVE 05/02/2022 1927   LEUKOCYTESUR NEGATIVE 05/02/2022 1927   Sepsis Labs: Invalid input(s): "PROCALCITONIN", "LACTICIDVEN"  Microbiology: Recent Results (from the past 240 hour(s))  MRSA Next Gen by PCR, Nasal     Status: None   Collection Time: 05/02/22  7:43 PM   Specimen: Nasal Mucosa; Nasal Swab  Result Value Ref Range Status   MRSA by PCR Next Gen NOT DETECTED NOT DETECTED Final    Comment: (NOTE) The GeneXpert MRSA Assay (FDA approved for NASAL specimens only), is one component of a comprehensive MRSA colonization surveillance program. It is not intended to diagnose MRSA infection nor to guide or monitor treatment for MRSA infections. Test performance is not FDA approved in patients less than 17 years old. Performed at [facility], [place] [address] [place], [place] [address]     Radiology Studies: DG Sacrum/Coccyx  Result Date: 05/02/2022 CLINICAL DATA:  Pt complained of tailbone pain after a fall two days ago onto her bottom. Pt reported radiating pain down her right leg. EXAM: SACRUM AND COCCYX - 2+ VIEW COMPARISON:  None Available. FINDINGS: No convincing fracture.  No bone lesion. SI joints are normally spaced and aligned. Soft tissues are unremarkable. IMPRESSION: Negative. Electronically Signed   By: Lajean Manes M.D.   On: 05/02/2022 11:19      [name]  If 7PM-7AM, please contact night-coverage [email or web address] 05/03/2022, 11:08 AM

## 2022-05-03 NOTE — Inpatient Diabetes Management (Signed)
Inpatient Diabetes Program Recommendations  AACE/ADA: New Consensus Statement on Inpatient Glycemic Control (2015)  Target Ranges:  Prepandial:   less than 140 mg/dL      Peak postprandial:   less than 180 mg/dL (1-2 hours)      Critically ill patients:  140 - 180 mg/dL   Lab Results  Component Value Date   GLUCAP 189 (H) 05/03/2022   HGBA1C 12.4 (H) 01/25/2022    Review of Glycemic Control  Diabetes history: DM1 Outpatient Diabetes medications: Toujeo 14 units QD, Novolog 8 units TID, Trulicity 0.'75mg'$  weekly Current orders for Inpatient glycemic control: Semglee 15 QD, Novolog 0-15 TID with meals and 0-5 HS + 3 units TID  HgbA1C - 12.2%  Inpatient Diabetes Program Recommendations:    Agree with orders.  If post-prandials > 180, increase Novolog to 5 units TID.  Spoke with pt at bedside regarding her diabetes control and HgbA1C of 12.2%. Pt states blood sugars are running high at home and [place]o [place] is increasing her prandial insulin. Pt states she tries to eat fairly healthy and uses CGM for blood sugar testing. Daughter states pt may be interested in an insulin pump. Instructed pt to f/u with Endo to discuss and see if candidate for a pump. Discussed hypoglycemia s/s and treatment. Pt states she drinks OJ or eats peppermint candy if blood sugar is low.  Answered questions.  Thank you. Lorenda Peck, RD, LDN, CDE Inpatient Diabetes Coordinator [phone]

## 2022-05-03 NOTE — Progress Notes (Signed)
Transition of Care (TOC) Screening Note  Patient Details  Name: [name] Date of Birth: [birth year]  Transition of Care [facility]) CM/SW Contact:    Sherie Don, LCSW Phone Number: 05/03/2022, 9:53 AM  Transition of Care Department [facility]) has reviewed patient and no TOC needs have been identified at this time. We will continue to monitor patient advancement through interdisciplinary progression rounds. If new patient transition needs arise, please place a TOC consult.

## 2022-05-03 NOTE — Plan of Care (Signed)

## 2022-05-04 ENCOUNTER — Observation Stay (HOSPITAL_COMMUNITY): Payer: Medicare PPO

## 2022-05-04 DIAGNOSIS — I440 Atrioventricular block, first degree: Secondary | ICD-10-CM | POA: Diagnosis not present

## 2022-05-04 DIAGNOSIS — E039 Hypothyroidism, unspecified: Secondary | ICD-10-CM | POA: Diagnosis not present

## 2022-05-04 DIAGNOSIS — E1110 Type 2 diabetes mellitus with ketoacidosis without coma: Secondary | ICD-10-CM | POA: Diagnosis not present

## 2022-05-04 DIAGNOSIS — N281 Cyst of kidney, acquired: Secondary | ICD-10-CM | POA: Diagnosis not present

## 2022-05-04 DIAGNOSIS — R2681 Unsteadiness on feet: Secondary | ICD-10-CM | POA: Diagnosis not present

## 2022-05-04 DIAGNOSIS — N179 Acute kidney failure, unspecified: Secondary | ICD-10-CM | POA: Diagnosis not present

## 2022-05-04 DIAGNOSIS — I10 Essential (primary) hypertension: Secondary | ICD-10-CM | POA: Diagnosis not present

## 2022-05-04 DIAGNOSIS — Y92009 Unspecified place in unspecified non-institutional (private) residence as the place of occurrence of the external cause: Secondary | ICD-10-CM

## 2022-05-04 DIAGNOSIS — S0101XD Laceration without foreign body of scalp, subsequent encounter: Secondary | ICD-10-CM | POA: Diagnosis not present

## 2022-05-04 DIAGNOSIS — E1165 Type 2 diabetes mellitus with hyperglycemia: Secondary | ICD-10-CM | POA: Diagnosis not present

## 2022-05-04 DIAGNOSIS — W19XXXD Unspecified fall, subsequent encounter: Secondary | ICD-10-CM | POA: Diagnosis not present

## 2022-05-04 LAB — LIPID PANEL
Cholesterol: 177 mg/dL (ref 0–200)
HDL: 57 mg/dL (ref >40)
LDL Cholesterol: 108 mg/dL — ABNORMAL HIGH (ref 0–99)
Total CHOL/HDL Ratio: 3.1 RATIO
Triglycerides: 60 mg/dL (ref <150)
VLDL: 12 mg/dL (ref 0–40)

## 2022-05-04 LAB — GLUCOSE, CAPILLARY
Glucose-Capillary: 264 mg/dL — ABNORMAL HIGH (ref 70–99)
Glucose-Capillary: 37 mg/dL — CL (ref 70–99)
Glucose-Capillary: 47 mg/dL — ABNORMAL LOW (ref 70–99)
Glucose-Capillary: 83 mg/dL (ref 70–99)
Glucose-Capillary: 94 mg/dL (ref 70–99)

## 2022-05-04 LAB — CBC
HCT: 34.6 % — ABNORMAL LOW (ref 36.0–46.0)
Hemoglobin: 11.1 g/dL — ABNORMAL LOW (ref 12.0–15.0)
MCH: 27.8 pg (ref 26.0–34.0)
MCHC: 32.1 g/dL (ref 30.0–36.0)
MCV: 86.7 fL (ref 80.0–100.0)
Platelets: 180 10*3/uL (ref 150–400)
RBC: 3.99 MIL/uL (ref 3.87–5.11)
RDW: 14.7 % (ref 11.5–15.5)
WBC: 6.2 10*3/uL (ref 4.0–10.5)
nRBC: 0 % (ref 0.0–0.2)

## 2022-05-04 LAB — RENAL FUNCTION PANEL
Albumin: 3 g/dL — ABNORMAL LOW (ref 3.5–5.0)
Anion gap: 11 (ref 5–15)
BUN: 14 mg/dL (ref 8–23)
CO2: 20 mmol/L — ABNORMAL LOW (ref 22–32)
Calcium: 8.5 mg/dL — ABNORMAL LOW (ref 8.9–10.3)
Chloride: 108 mmol/L (ref 98–111)
Creatinine, Ser: 1.05 mg/dL — ABNORMAL HIGH (ref 0.44–1.00)
GFR, Estimated: 52 mL/min — ABNORMAL LOW (ref >60)
Glucose, Bld: 135 mg/dL — ABNORMAL HIGH (ref 70–99)
Phosphorus: 2.7 mg/dL (ref 2.5–4.6)
Potassium: 4.1 mmol/L (ref 3.5–5.1)
Sodium: 139 mmol/L (ref 135–145)

## 2022-05-04 LAB — MAGNESIUM: Magnesium: 1.8 mg/dL (ref 1.7–2.4)

## 2022-05-04 LAB — CK: Total CK: 32 U/L — ABNORMAL LOW (ref 38–234)

## 2022-05-04 LAB — TSH: TSH: 6.381 u[IU]/mL — ABNORMAL HIGH (ref 0.350–4.500)

## 2022-05-04 MED ORDER — INSULIN GLARGINE-YFGN 100 UNIT/ML ~~LOC~~ SOLN
10.0000 [IU] | Freq: Two times a day (BID) | SUBCUTANEOUS | Status: DC
  Administered 2022-05-04: 10 [IU] via SUBCUTANEOUS
  Filled 2022-05-04 (×2): qty 0.1

## 2022-05-04 MED ORDER — AMIODARONE HCL 200 MG PO TABS: 200.0000 mg | ORAL_TABLET | Freq: Every day | ORAL | 2 refills | Status: DC

## 2022-05-04 MED ORDER — INSULIN ASPART 100 UNIT/ML IJ SOLN
3.0000 [IU] | Freq: Three times a day (TID) | INTRAMUSCULAR | Status: DC
  Administered 2022-05-04: 3 [IU] via SUBCUTANEOUS

## 2022-05-04 MED ORDER — TOUJEO MAX SOLOSTAR 300 UNIT/ML ~~LOC~~ SOPN: 14.0000 [IU] | PEN_INJECTOR | Freq: Every evening | SUBCUTANEOUS | 0 refills | Status: DC

## 2022-05-04 NOTE — Telephone Encounter (Signed)
Patient's granddaughter came by office to pick up paperwork.

## 2022-05-04 NOTE — Progress Notes (Signed)
Patient c/o sweating and feeling hot. Patient assessed and found to be clammy. CBG obtained with reading of 37. Patient given OJ per hypoglycemia protocol. Patient reassessed with CBG reading of 47.

## 2022-05-04 NOTE — Progress Notes (Signed)
Patient discharged to home w/ family. Given all belongings, instructions. Verbalized understand of instructions. Escorted to pov via w/c.

## 2022-05-04 NOTE — Progress Notes (Signed)
CBG obtained with reading of 47. Patient given regular Soft Drink per hypoglycemia protocol. Patient reassessed with CBG reading of 83. Patient reports she feels better.

## 2022-05-04 NOTE — TOC Transition Note (Signed)
Transition of Care [facility]) - CM/SW Discharge Note   Patient Details  Name: Rossy Virag MRN: [id number] Date of Birth: [birth year]  Transition of Care [facility]) CM/SW Contact:  Lennart Pall, LCSW Phone Number: 05/04/2022, 11:31 AM   Clinical Narrative:    Met with pt to review recommendations for [facility] follow up.  Pt has had [facility] in the past, however, request new agency and no preference.  Referrals placed with [facility] and information placed on AVS.  Pt ready for dc home today.  No further TOC needs.   Final next level of care: [place] Barriers to Discharge: Barriers Resolved   Patient Goals and CMS Choice Patient states their goals for this hospitalization and ongoing recovery are:: return home      Discharge Placement                       Discharge Plan and Services                DME Arranged: N/A DME Agency: NA       HH Arranged: RN, PT, OT, Nurse's Aide [place] Agency: [place] Date HH Agency Contacted: 05/04/22 Time [place]: 0102 Representative spoke with at [place]: [place] ([place]) Interventions     Readmission Risk Interventions    01/27/2022   12:37 PM  Readmission Risk Prevention Plan  Transportation Screening Complete  PCP or Specialist Appt within 3-5 Days Complete  HRI or [place] Complete  Social Work Consult for [place] Planning/Counseling Complete  Palliative Care Screening Not Applicable  Medication Review [employer]) Complete

## 2022-05-04 NOTE — Discharge Summary (Signed)
Physician Discharge Summary  [name] QPY:[id number] DOB: [birth year] DOA: 05/02/2022  PCP: Deland Pretty, MD  Admit date: 05/02/2022 Discharge date: 05/04/2022 Admitted From: Home Disposition: Home Recommendations for Outpatient Follow-up:  Follow up with PCP in 1 week Check CBC and BMP in 1 week Adjust insulin as appropriate Please follow up on the following pending results: None  Home Health: PT/OT/RN/aide Equipment/Devices: Patient had appropriate DME  Discharge Condition: Stable CODE STATUS: Full code  Follow-up Information     Deland Pretty, MD. Schedule an appointment as soon as possible for a visit in 1 week(s).   Specialty: Internal Medicine Contact information: [address] [place] [address] [phone]         [facility], [place] Follow up.   Specialty: Home Health Services Why: to provide home nursing, therapy and aide services Contact information: [place] [address] [phone]                 Hospital course 87 year old F with PMH of IDDM-2, unsteady gait, paroxysmal A-fib on Eliquis, HTN, hypothyroidism and CKD-3A brought to ED by EMS due to elevated blood glucose, and admitted for DKA and AKI.  Reportedly had elevated blood glucose despite increasing her insulin per recommendation by her her provider, and was advised to go to ED.    In ED, vital stable.  pH 7.24.  CBG 481.  Bicarb 11.  AG 17. Cr 1.51 (baseline about 1.1).  BUN 24.  UA with> 500 glucose and 80 ketones.  BHA 6.68.  EKG sinus rhythm with prolonged PR to 220.  Patient was started on IV fluid and insulin drip, and admitted.  The next day, DKA and AKI resolved.  However, she had an episode of hyperglycemia and then went into hypoglycemia after increasing insulin.  So the decision was made to discharge on home Toujeo 14 units daily and NovoLog 8 units 3 times daily which she has been doing before this admission.  She is also on  Trulicity.  Advised to maintain good hydration and follow-up with PCP.   See individual problem list below for more.   Problems addressed during this hospitalization Principal Problem:   DKA (diabetic ketoacidosis) ([place]) Active Problems:   Uncontrolled type 2 diabetes mellitus with hyperglycemia, with long-term current use of insulin (HCC)   Essential hypertension   Acute renal failure superimposed on stage 3a chronic kidney disease (HCC)   PAF (paroxysmal atrial fibrillation) ([place])   Hypercholesterolemia   Hypothyroidism   Fall at home, subsequent encounter   Unsteady gait   Scalp laceration   First degree AV block   Uncontrolled IDDM-2 with diabetic ketoacidosis and CKD-3A: Last A1c 12.2% (12.4% in 01/2022).  DKA resolved. Discharged on home medications.  She did not tolerate increased dose of insulin.  -Outpatient follow-up with PCP in 1 to 2 weeks -[facility] RN ordered.    AKI on CKD-3A: Baseline Cr 1.1-1.2.  AKI resolved.  Renal ultrasound consistent with CKD.   Hyponatremia: Likely due to hyperglycemia.  Resolved.   Paroxysmal atrial fibrillation/first-degree AVB: Now with mild sinus bradycardia and first-degree AVB.  Patient has been taking amiodarone 200 mg twice daily although it was prescribed daily. -Discharged on amiodarone 200 mg daily, Cardizem CD and Eliquis. -May consider increasing Cardizem CD given first-degree AVB   Hypertension in patient with history of essential hypertension: Normotensive.  Bradycardic to 50s but improved to 60s with exertion. -Consider decreasing Cardizem CD   Hypothyroidism: TSH slightly  elevated likely from amiodarone. -Decreased amiodarone -Continue home Synthroid   Fall at home/scalp laceration/unsteady gait: Subsequent encounter.  Patient fell and hit the back of her head on the gravel driveway.  She thinks she stumbled on a small hill.  She reports history of unsteady gait.  She has a walker but did not use her walker when she fell.  She  had a small laceration that was repaired with 4 staples.  No prodromes leading to the fall.  No LOC.  CT head without acute finding.  X-ray of sacrum and coccyx without significant finding.   -Staple removal in 3 to 4 days -HH PT/OT/RN/aide ordered.             Vital signs Vitals:   05/03/22 1716 05/03/22 2135 05/04/22 0144 05/04/22 0552  BP: (!) 107/55 (!) 111/55 (!) 129/53 135/68  Pulse: (!) 58 (!) 55 (!) 58 61  Temp: 97.8 F (36.6 C) 98.8 F (37.1 C) 97.7 F (36.5 C) 98.4 F (36.9 C)  Resp: '16 18 18 17  '$ Height:      Weight:      SpO2: 100% 98% 99% 97%  TempSrc: Oral Oral Oral Oral  BMI (Calculated):         Discharge exam  GENERAL: No apparent distress.  Nontoxic. HEENT: MMM.  Vision and hearing grossly intact.  4 staples over occipital scalp NECK: Supple.  No apparent JVD.  RESP:  No IWOB.  Fair aeration bilaterally. CVS:  RRR. Heart sounds normal.  ABD/GI/GU: BS+. Abd soft, NTND.  MSK/EXT:  Moves extremities. No apparent deformity. No edema.  SKIN: no apparent skin lesion or wound NEURO: Awake and alert. Oriented appropriately.  No apparent focal neuro deficit. PSYCH: Calm. Normal affect.   Discharge Instructions Discharge Instructions     Call MD for:  difficulty breathing, headache or visual disturbances   Complete by: As directed    Call MD for:  extreme fatigue   Complete by: As directed    Call MD for:  persistant dizziness or light-headedness   Complete by: As directed    Call MD for:  persistant nausea and vomiting   Complete by: As directed    Call MD for:  temperature >100.4   Complete by: As directed    Diet - low sodium heart healthy   Complete by: As directed    Diet Carb Modified   Complete by: As directed    Discharge instructions   Complete by: As directed    It has been a pleasure taking care of you!  You were hospitalized due to diabetic ketoacidosis/elevated blood glucose for which you have been treated with IV fluid and insulin  drips.  We made some adjustment to your diabetic medication and your heart medication during this hospitalization.  Please review your new medication list and the directions on your medications before you take them.  It is very important that you keep yourself hydrated at all time.  Follow-up with your primary care doctor in 1 to 2 weeks or sooner if needed.   Take care,   Increase activity slowly   Complete by: As directed       Allergies as of 05/04/2022   No Known Allergies      Medication List     TAKE these medications    amiodarone 200 MG tablet Commonly known as: PACERONE Take 1 tablet (200 mg total) by mouth daily. Direction change What changed: when to take this   Eliquis 2.5 MG  Tabs tablet Generic drug: apixaban Take 2.5 mg by mouth 2 (two) times daily. What changed: Another medication with the same name was removed. Continue taking this medication, and follow the directions you see here.   Integra Plus Caps Take 1 capsule by mouth every morning.   Klor-Con M20 20 MEQ tablet Generic drug: potassium chloride SA Take 20 mEq by mouth daily.   levothyroxine 88 MCG tablet Commonly known as: SYNTHROID Take 1 tablet (88 mcg total) by mouth daily before breakfast.   mirtazapine 15 MG tablet Commonly known as: REMERON Take 15 mg by mouth at bedtime.   NovoLOG FlexPen 100 UNIT/ML FlexPen Generic drug: insulin aspart Inject 8 Units into the skin 3 (three) times daily with meals.   pantoprazole 40 MG tablet Commonly known as: PROTONIX Take 1 tablet (40 mg total) by mouth daily at 6 (six) AM.   polyethylene glycol 17 g packet Commonly known as: MIRALAX / GLYCOLAX Take 17 g by mouth daily.   senna-docusate 8.6-50 MG tablet Commonly known as: Senokot-S Take 2 tablets by mouth at bedtime.   Tiadylt ER 180 MG 24 hr capsule Generic drug: diltiazem TAKE 1 CAPSULE BY MOUTH EVERY DAY What changed: how much to take   Toujeo Max SoloStar 300 UNIT/ML Solostar  Pen Generic drug: insulin glargine (2 Unit Dial) Inject 14 Units into the skin at bedtime.   Trulicity 2.83 MO/2.9UT Sopn Generic drug: Dulaglutide Inject 1.5 mg into the skin once a week. sundys        Consultations: None  Procedures/Studies:   US RENAL  Result Date: 05/04/2022 CLINICAL DATA:  Renal dysfunction EXAM: RENAL / URINARY TRACT ULTRASOUND COMPLETE COMPARISON:  CT done on 03/15/2011 FINDINGS: Right Kidney: Renal measurements: 9.1 x 5.9 x 6 cm = volume: 170 mL. There is no hydronephrosis. There is increased cortical echogenicity. There is a 6 mm cyst in the margin. Left Kidney: Renal measurements: 9.1 x 5.6 x 4.3 cm. There is no hydronephrosis. There is increased cortical echogenicity. Portions of the kidneys are not adequately visualized due to the patient's body habitus and adjacent bowel gas. Bladder: Urinary bladder is not distended. Other: None. IMPRESSION: There is no hydronephrosis. Increased cortical echogenicity suggest medical renal disease. Portions of left kidney could not be adequately visualized due to technical difficulties. Electronically Signed   By: Elmer Picker M.D.   On: 05/04/2022 09:26   DG Sacrum/Coccyx  Result Date: 05/02/2022 CLINICAL DATA:  Pt complained of tailbone pain after a fall two days ago onto her bottom. Pt reported radiating pain down her right leg. EXAM: SACRUM AND COCCYX - 2+ VIEW COMPARISON:  None Available. FINDINGS: No convincing fracture.  No bone lesion. SI joints are normally spaced and aligned. Soft tissues are unremarkable. IMPRESSION: Negative. Electronically Signed   By: Lajean Manes M.D.   On: 05/02/2022 11:19   CT Cervical Spine Wo Contrast  Result Date: 04/30/2022 CLINICAL DATA:  Status post fall. EXAM: CT CERVICAL SPINE WITHOUT CONTRAST TECHNIQUE: Multidetector CT imaging of the cervical spine was performed without intravenous contrast. Multiplanar CT image reconstructions were also generated. RADIATION DOSE REDUCTION:  This exam was performed according to the departmental dose-optimization program which includes automated exposure control, adjustment of the mA and/or kV according to patient size and/or use of iterative reconstruction technique. COMPARISON:  None Available. FINDINGS: Alignment: There is mild straightening of the normal cervical spine lordosis. Approximately 1 mm anterolisthesis of the C4 vertebral body is noted on C5. Skull base and vertebrae: No acute  fracture. No primary bone lesion or focal pathologic process. Soft tissues and spinal canal: No prevertebral fluid or swelling. No visible canal hematoma. Disc levels: Marked severity endplate sclerosis is seen at the levels of C3-C4, C5-C6 and C6-C7. Marked severity intervertebral disc space narrowing is seen at C3-C4, C5-C6 and C6-C7. Bilateral marked severity multilevel facet joint hypertrophy is noted. Upper chest: Negative. Other: None. IMPRESSION: 1. Marked severity multilevel degenerative changes without evidence of an acute fracture or subluxation. Electronically Signed   By: Virgina Norfolk M.D.   On: 04/30/2022 23:00   CT Head Wo Contrast  Result Date: 04/30/2022 CLINICAL DATA:  Status post fall. EXAM: CT HEAD WITHOUT CONTRAST TECHNIQUE: Contiguous axial images were obtained from the base of the skull through the vertex without intravenous contrast. RADIATION DOSE REDUCTION: This exam was performed according to the departmental dose-optimization program which includes automated exposure control, adjustment of the mA and/or kV according to patient size and/or use of iterative reconstruction technique. COMPARISON:  October 02, 2021 FINDINGS: Brain: There is mild cerebral atrophy with widening of the extra-axial spaces and ventricular dilatation. There are areas of decreased attenuation within the white matter tracts of the supratentorial brain, consistent with microvascular disease changes. A small chronic right frontoparietal lacunar infarct is again  seen. Vascular: No hyperdense vessel or unexpected calcification. Skull: Normal. Negative for fracture or focal lesion. Sinuses/Orbits: No acute finding. Other: None. IMPRESSION: 1. No acute intracranial abnormality. 2. Generalized cerebral atrophy with chronic white matter small vessel ischemic changes. Electronically Signed   By: Virgina Norfolk M.D.   On: 04/30/2022 22:57       The results of significant diagnostics from this hospitalization (including imaging, microbiology, ancillary and laboratory) are listed below for reference.     Microbiology: Recent Results (from the past 240 hour(s))  MRSA Next Gen by PCR, Nasal     Status: None   Collection Time: 05/02/22  7:43 PM   Specimen: Nasal Mucosa; Nasal Swab  Result Value Ref Range Status   MRSA by PCR Next Gen NOT DETECTED NOT DETECTED Final    Comment: (NOTE) The GeneXpert MRSA Assay (FDA approved for NASAL specimens only), is one component of a comprehensive MRSA colonization surveillance program. It is not intended to diagnose MRSA infection nor to guide or monitor treatment for MRSA infections. Test performance is not FDA approved in patients less than 59 years old. Performed at [facility], [address] [place], [place] [address]      Labs:  CBC: Recent Labs  Lab 05/02/22 1045 05/04/22 0333  WBC 8.5 6.2  HGB 11.5* 11.1*  HCT 37.3 34.6*  MCV 91.6 86.7  PLT 216 180   BMP &GFR Recent Labs  Lab 05/02/22 1329 05/02/22 2027 05/02/22 2348 05/03/22 0426 05/03/22 0714 05/03/22 2125 05/04/22 0333  NA 133* 135 136 136  --   --  139  K 4.2 4.6 4.0 3.6  --   --  4.1  CL 105 108 108 110  --   --  108  CO2 11* 18* 21* 21*  --   --  20*  GLUCOSE 355* 153* 91 154*  --  241* 135*  BUN 24* '21 20 18  '$ --   --  14  CREATININE 1.51* 1.19* 1.10* 1.13*  --   --  1.05*  CALCIUM 8.4* 8.7* 8.5* 8.4*  --   --  8.5*  MG  --   --   --   --  2.0  --  1.8  PHOS  --   --   --   --  2.1*  --  2.7    Estimated Creatinine Clearance: 31.5 mL/min (A) (by C-G formula based on SCr of 1.05 mg/dL (H)). Liver & Pancreas: Recent Labs  Lab 05/04/22 0333  ALBUMIN 3.0*   No results for input(s): "LIPASE", "AMYLASE" in the last 168 hours. No results for input(s): "AMMONIA" in the last 168 hours. Diabetic: Recent Labs    05/03/22 1327  HGBA1C 12.2*   Recent Labs  Lab 05/04/22 0039 05/04/22 0108 05/04/22 0203 05/04/22 0715 05/04/22 1140  GLUCAP 37* 47* 83 94 264*   Cardiac Enzymes: Recent Labs  Lab 05/04/22 0333  CKTOTAL 32*   No results for input(s): "PROBNP" in the last 8760 hours. Coagulation Profile: No results for input(s): "INR", "PROTIME" in the last 168 hours. Thyroid Function Tests: Recent Labs    05/04/22 0333  TSH 6.381*   Lipid Profile: Recent Labs    05/04/22 0333  CHOL 177  HDL 57  LDLCALC 108*  TRIG 60  CHOLHDL 3.1   Anemia Panel: No results for input(s): "VITAMINB12", "FOLATE", "FERRITIN", "TIBC", "IRON", "RETICCTPCT" in the last 72 hours. Urine analysis:    Component Value Date/Time   COLORURINE YELLOW 05/02/2022 1927   APPEARANCEUR CLEAR 05/02/2022 1927   LABSPEC 1.022 05/02/2022 1927   PHURINE 5.0 05/02/2022 1927   GLUCOSEU >=500 (A) 05/02/2022 1927   HGBUR NEGATIVE 05/02/2022 1927   BILIRUBINUR NEGATIVE 05/02/2022 1927   KETONESUR 80 (A) 05/02/2022 1927   PROTEINUR 30 (A) 05/02/2022 1927   UROBILINOGEN 0.2 03/17/2011 0847   NITRITE NEGATIVE 05/02/2022 1927   LEUKOCYTESUR NEGATIVE 05/02/2022 1927   Sepsis Labs: Invalid input(s): "PROCALCITONIN", "LACTICIDVEN"   SIGNED:  Mercy Riding, MD  [facility] 05/04/2022, 2:05 PM

## 2022-05-04 NOTE — Evaluation (Signed)
Occupational Therapy Evaluation Patient Details Name: [name] MRN: [id number] DOB: 16-Apr-1935 Today's Date: 05/04/2022   History of Present Illness 87 year old female with PMH of IDDM-2, unsteady gait, paroxysmal A-fib on Eliquis, HTN, hypothyroidism and CKD-3A brought to ED by EMS due to elevated blood glucose, and admitted for DKA and AKI.   Clinical Impression   Patient is currently requiring assistance with ADLs including min guard assist with Lower body ADLs, setup assist with Upper body ADLs,  as well as supervision assist with bed mobility with HOB elevated and Min guard assist with functional transfers to toilet.  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  During this evaluation, patient was limited by generalized weakness, impaired activity tolerance, and pain from recent fall, all of which has the potential to impact patient's safety and independence during functional mobility, as well as performance for ADLs.  Patient lives at home alone, but her granddaughter will stay with her for 3-4 days who is able to provide 24/7 supervision and assistance, however pt expressed concern for how she will manage once her granddaughter returns to [place].   Patient demonstrates good rehab potential, and should benefit from continued skilled occupational therapy services while in acute care to maximize safety, independence and quality of life at home.  Continued occupational therapy services in the home is recommended.  ?     Recommendations for follow up therapy are one component of a multi-disciplinary discharge planning process, led by the attending physician.  Recommendations may be updated based on patient status, additional functional criteria and insurance authorization.   Follow Up Recommendations  Home health OT    Assistance Recommended at Discharge Intermittent Supervision/Assistance  Patient can return  home with the following Assistance with cooking/housework;A little help with bathing/dressing/bathroom    Functional Status Assessment  Patient has had a recent decline in their functional status and demonstrates the ability to make significant improvements in function in a reasonable and predictable amount of time.  Equipment Recommendations  BSC/3in1    Recommendations for Other Services       Precautions / Restrictions Precautions Precautions: Fall Restrictions Weight Bearing Restrictions: No      Mobility Bed Mobility Overal bed mobility: Needs Assistance Bed Mobility: Supine to Sit     Supine to sit: HOB elevated, Supervision          Transfers Overall transfer level: Needs assistance Equipment used: Rolling walker (2 wheels) Transfers: Sit to/from Stand Sit to Stand: Min guard                  Balance Overall balance assessment: History of Falls, Needs assistance Sitting-balance support: No upper extremity supported, Feet supported Sitting balance-Leahy Scale: Good     Standing balance support: Reliant on assistive device for balance, During functional activity, Bilateral upper extremity supported Standing balance-Leahy Scale: Fair Standing balance comment: Moves quickly and needs cues to slow down for line and general safety                           ADL either performed or assessed with clinical judgement   ADL Overall ADL's : Needs assistance/impaired Eating/Feeding: Independent   Grooming: Set up;Sitting;Wash/dry hands Grooming Details (indicate cue type and reason): Declined standing for hand hygiene and preferred use of gel while seated Upper Body Bathing: Supervision/ safety;Set up;Sitting   Lower Body Bathing: Supervison/ safety;Sitting/lateral leans Lower Body Bathing Details (  indicate cue type and reason): No shower chair but pt given handout and education on use of BSC as tub/shower chair with step by step explanation. Upper  Body Dressing : Set up;Sitting   Lower Body Dressing: Min guard;Set up;Sitting/lateral leans;Sit to/from stand Lower Body Dressing Details (indicate cue type and reason): Pt able to don/doff socks at EOB with supervision. Min guard for standing LE dressing. Toilet Transfer: Financial planner (2 wheels);Ambulation   Toileting- Clothing Manipulation and Hygiene: Supervision/safety;Sitting/lateral lean   Tub/ Shower Transfer: Tub Product manager Details (indicate cue type and reason): see LB bathing above. Functional mobility during ADLs: Min guard;Supervision/safety       Vision Baseline Vision/History:  (readers only) Vision Assessment?: No apparent visual deficits     Perception     Praxis      Pertinent Vitals/Pain Pain Assessment Pain Assessment: Faces Faces Pain Scale: Hurts little more Pain Location: head and tailbone. Pain Descriptors / Indicators: Sore Pain Intervention(s): Other (comment), Limited activity within patient's tolerance, Monitored during session (Pt refused pain meds)     Hand Dominance Right   Extremity/Trunk Assessment Upper Extremity Assessment Upper Extremity Assessment: Overall WFL for tasks assessed   Lower Extremity Assessment Lower Extremity Assessment: Generalized weakness   Cervical / Trunk Assessment Cervical / Trunk Exceptions: 4 stiches in bacl of head from fall in garden prior to admit   Communication Communication Communication: No difficulties   Cognition Arousal/Alertness: Awake/alert Behavior During Therapy: WFL for tasks assessed/performed Overall Cognitive Status: Within Functional Limits for tasks assessed                                       General Comments       Exercises     Shoulder Instructions      Home Living Family/patient expects to be discharged to:: Private residence Living Arrangements: Alone Available Help at Discharge: Family;Available  PRN/intermittently (1 daughter lives in town, and works days. Granddaughter will stay with pt for 4 days starting today) Type of Home: House Home Access: Stairs to enter [employer] of Steps: 1 to 2 Entrance Stairs-Rails: None Home Layout: Laundry or work area in basement;Able to live on main level with bedroom/bathroom     Bathroom Shower/Tub: Tub/shower unit (easy step in tub/low lip of tub)   Bathroom Toilet: Handicapped height     Home Equipment: Conservation officer, nature (2 wheels);Cane - single point          Prior Functioning/Environment Prior Level of Function : Independent/Modified Independent;Working/employed;Driving;History of Falls (last six months)             Mobility Comments: Still driving. 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        OT Problem List: Pain;Decreased activity tolerance;Impaired balance (sitting and/or standing)      OT Treatment/Interventions: Self-care/ADL training;Therapeutic activities;Energy conservation;DME and/or AE instruction;Patient/family education;Balance training    OT Goals(Current goals can be found in the care plan section) Acute Rehab OT Goals Patient Stated Goal: To have a shower chair and to have a caregiver provided for housework OT Goal Formulation: With patient Time For Goal Achievement: 05/18/22 Potential to Achieve Goals: Good ADL Goals Pt Will Perform Tub/Shower Transfer: Tub transfer;3 in 1;with supervision Additional ADL Goal #1: Pt will engage in 10 min functional activities without loss of standing balance, in order to demonstrate improved  activity tolerance and balance needed to perform ADLs safely at home and work.  OT Frequency: Min 2X/week    Co-evaluation              AM-PAC OT "6 Clicks" Daily Activity     Outcome Measure Help from another person eating meals?: None Help from another person taking care of personal grooming?: A  Little Help from another person toileting, which includes using toliet, bedpan, or urinal?: A Little Help from another person bathing (including washing, rinsing, drying)?: A Little Help from another person to put on and taking off regular upper body clothing?: A Little Help from another person to put on and taking off regular lower body clothing?: A Little 6 Click Score: 19   End of Session Equipment Utilized During Treatment: Rolling walker (2 wheels)  Activity Tolerance: Patient tolerated treatment well Patient left: in bed;with call bell/phone within reach;with bed alarm set  OT Visit Diagnosis: Unsteadiness on feet (R26.81);Pain Pain - part of body:  (Tailbone and head)                Time: 7939-6886 OT Time Calculation (min): 31 min Charges:  OT General Charges $OT Visit: 1 Visit OT Evaluation $OT Eval Low Complexity: 1 Low OT Treatments $Self Care/Home Management : 8-22 mins  Anderson Malta, OT Acute Rehab Services Office: [phone] 05/04/2022  Julien Girt 05/04/2022, 9:21 AM

## 2022-05-08 ENCOUNTER — Encounter (HOSPITAL_COMMUNITY): Payer: Self-pay

## 2022-05-08 ENCOUNTER — Ambulatory Visit (HOSPITAL_COMMUNITY)
Admission: EM | Admit: 2022-05-08 | Discharge: 2022-05-08 | Disposition: A | Discharge status: Home / Self Care | Payer: Medicare PPO | Attending: Internal Medicine | Admitting: Internal Medicine

## 2022-05-08 DIAGNOSIS — Z4802 Encounter for removal of sutures: Secondary | ICD-10-CM | POA: Diagnosis not present

## 2022-05-08 DIAGNOSIS — S0182XD Laceration with foreign body of other part of head, subsequent encounter: Secondary | ICD-10-CM

## 2022-05-08 NOTE — ED Triage Notes (Signed)
Pt here for staple removal to the back of her head.  Staples dry and intact, no redness or swelling noted.

## 2022-05-11 DIAGNOSIS — R630 Anorexia: Secondary | ICD-10-CM | POA: Diagnosis not present

## 2022-05-11 DIAGNOSIS — D649 Anemia, unspecified: Secondary | ICD-10-CM | POA: Diagnosis not present

## 2022-05-12 DIAGNOSIS — E139 Other specified diabetes mellitus without complications: Secondary | ICD-10-CM | POA: Diagnosis not present

## 2022-05-12 DIAGNOSIS — R300 Dysuria: Secondary | ICD-10-CM | POA: Diagnosis not present

## 2022-05-12 DIAGNOSIS — I10 Essential (primary) hypertension: Secondary | ICD-10-CM | POA: Diagnosis not present

## 2022-05-12 DIAGNOSIS — N1831 Chronic kidney disease, stage 3a: Secondary | ICD-10-CM | POA: Diagnosis not present

## 2022-05-18 DIAGNOSIS — K5730 Diverticulosis of large intestine without perforation or abscess without bleeding: Secondary | ICD-10-CM | POA: Diagnosis not present

## 2022-05-18 DIAGNOSIS — N3289 Other specified disorders of bladder: Secondary | ICD-10-CM | POA: Diagnosis not present

## 2022-05-18 DIAGNOSIS — R319 Hematuria, unspecified: Secondary | ICD-10-CM | POA: Diagnosis not present

## 2022-05-18 DIAGNOSIS — N281 Cyst of kidney, acquired: Secondary | ICD-10-CM | POA: Diagnosis not present

## 2022-05-18 DIAGNOSIS — R3121 Asymptomatic microscopic hematuria: Secondary | ICD-10-CM | POA: Diagnosis not present

## 2022-05-31 NOTE — Progress Notes (Deleted)
[facility] OFFICE PROGRESS NOTE  Deland Pretty, MD [place] [address]  DIAGNOSIS: Anemia  PRIOR THERAPY: None   CURRENT THERAPY: Integra plus  INTERVAL HISTORY: [name] 87 y.o. female returns to clinic today for follow-up visit.  The patient was first seen in the clinic on 04/05/2022.  At that time, the patient was referred to the clinic for worsening anemia.  She did have acute worsening in anemia after a hospital admission in April 2023.  She then presented to the hospital again in late April 2023 for worsening anemia and she was admitted for possible GI bleed due to being Hemoccult positive.  She was reporting dark sticky stools and bright red blood per rectum.  Throughout the course of her hospitalization, she was seen by GI, Dr. Fuller Plan and underwent EGD and colonoscopy.  No active bleeding was noted.  Dr. Fuller Plan noted a solitary ulcer in the rectum.  Of note the patient also has some degree of chronic kidney disease.  Dr. Julien Nordmann recommended that she start an iron supplement with Integra plus at her last appointment.  Since last being seen, the patient is feeling ***.  Sees her PCP for thyroid dysfunction?  Ongoing weakness.  She denies any significant dyspnea on exertion she reports baseline cold intolerance.  She denies any recent syncopal episodes.  Denies any known bleeding or bruising.  Denies any chest pain.  She is here today for evaluation and repeat blood work.     MEDICAL HISTORY: Past Medical History:  Diagnosis Date   Colon polyp    Diabetes mellitus    Hyperlipidemia    Hypertension    Poor appetite    Thyroid disease     ALLERGIES:  has No Known Allergies.  MEDICATIONS:  Current Outpatient Medications  Medication Sig Dispense Refill   amiodarone (PACERONE) 200 MG tablet Take 1 tablet (200 mg total) by mouth daily. Direction change 90 tablet 2   ELIQUIS 2.5 MG TABS tablet Take 2.5 mg by mouth 2 (two) times  daily.     FeFum-FePoly-FA-B Cmp-C-Biot (INTEGRA PLUS) CAPS Take 1 capsule by mouth every morning. 30 capsule 2   insulin aspart (NOVOLOG FLEXPEN) 100 UNIT/ML FlexPen Inject 8 Units into the skin 3 (three) times daily with meals. 15 mL 0   insulin glargine, 2 Unit Dial, (TOUJEO MAX SOLOSTAR) 300 UNIT/ML Solostar Pen Inject 14 Units into the skin at bedtime. 15 mL 0   KLOR-CON M20 20 MEQ tablet Take 20 mEq by mouth daily.     levothyroxine (SYNTHROID) 88 MCG tablet Take 1 tablet (88 mcg total) by mouth daily before breakfast. 30 tablet 2   mirtazapine (REMERON) 15 MG tablet Take 15 mg by mouth at bedtime.     pantoprazole (PROTONIX) 40 MG tablet Take 1 tablet (40 mg total) by mouth daily at 6 (six) AM. 30 tablet 1   polyethylene glycol (MIRALAX / GLYCOLAX) 17 g packet Take 17 g by mouth daily. 30 each 1   senna-docusate (SENOKOT-S) 8.6-50 MG tablet Take 2 tablets by mouth at bedtime. 60 tablet 2   TIADYLT ER 180 MG 24 hr capsule TAKE 1 CAPSULE BY MOUTH EVERY DAY (Patient taking differently: Take 180 mg by mouth daily.) 90 capsule 3   TRULICITY 7.51 WC/5.8NI SOPN Inject 1.5 mg into the skin once a week. sundys     No current facility-administered medications for this visit.    SURGICAL HISTORY:  Past Surgical History:  Procedure Laterality  Date   BIOPSY  02/07/2022   Procedure: BIOPSY;  Surgeon: Ladene Artist, MD;  Location: [facility] ENDOSCOPY;  Service: Gastroenterology;;   BREAST EXCISIONAL BIOPSY Right    CARDIOVASCULAR STRESS TEST  07/15/2006   Normal scan, no ECG changes   COLONOSCOPY WITH PROPOFOL N/A 02/07/2022   Procedure: COLONOSCOPY WITH PROPOFOL;  Surgeon: Ladene Artist, MD;  Location: [place];  Service: Gastroenterology;  Laterality: N/A;   ESOPHAGOGASTRODUODENOSCOPY (EGD) WITH PROPOFOL N/A 02/07/2022   Procedure: ESOPHAGOGASTRODUODENOSCOPY (EGD) WITH PROPOFOL;  Surgeon: Ladene Artist, MD;  Location: [place];  Service: Gastroenterology;  Laterality: N/A;   TRANSTHORACIC  ECHOCARDIOGRAM  01/08/2011   EF 60-65%, moderae mitral regurg, LA mild-moderately dilated,    REVIEW OF SYSTEMS:   Review of Systems  Constitutional: Negative for appetite change, chills, fatigue, fever and unexpected weight change.  HENT:   Negative for mouth sores, nosebleeds, sore throat and trouble swallowing.   Eyes: Negative for eye problems and icterus.  Respiratory: Negative for cough, hemoptysis, shortness of breath and wheezing.   Cardiovascular: Negative for chest pain and leg swelling.  Gastrointestinal: Negative for abdominal pain, constipation, diarrhea, nausea and vomiting.  Genitourinary: Negative for bladder incontinence, difficulty urinating, dysuria, frequency and hematuria.   Musculoskeletal: Negative for back pain, gait problem, neck pain and neck stiffness.  Skin: Negative for itching and rash.  Neurological: Negative for dizziness, extremity weakness, gait problem, headaches, light-headedness and seizures.  Hematological: Negative for adenopathy. Does not bruise/bleed easily.  Psychiatric/Behavioral: Negative for confusion, depression and sleep disturbance. The patient is not nervous/anxious.     PHYSICAL EXAMINATION:  There were no vitals taken for this visit.  ECOG PERFORMANCE STATUS: {CHL ONC ECOG [id number]  Physical Exam  Constitutional: Oriented to person, place, and time and well-developed, well-nourished, and in no distress. No distress.  HENT:  Head: Normocephalic and atraumatic.  Mouth/Throat: Oropharynx is clear and moist. No oropharyngeal exudate.  Eyes: Conjunctivae are normal. Right eye exhibits no discharge. Left eye exhibits no discharge. No scleral icterus.  Neck: Normal range of motion. Neck supple.  Cardiovascular: Normal rate, regular rhythm, normal heart sounds and intact distal pulses.   Pulmonary/Chest: Effort normal and breath sounds normal. No respiratory distress. No wheezes. No rales.  Abdominal: Soft. Bowel sounds are normal.  Exhibits no distension and no mass. There is no tenderness.  Musculoskeletal: Normal range of motion. Exhibits no edema.  Lymphadenopathy:    No cervical adenopathy.  Neurological: Alert and oriented to person, place, and time. Exhibits normal muscle tone. Gait normal. Coordination normal.  Skin: Skin is warm and dry. No rash noted. Not diaphoretic. No erythema. No pallor.  Psychiatric: Mood, memory and judgment normal.  Vitals reviewed.  LABORATORY DATA: Lab Results  Component Value Date   WBC 6.2 05/04/2022   HGB 11.1 (L) 05/04/2022   HCT 34.6 (L) 05/04/2022   MCV 86.7 05/04/2022   PLT 180 05/04/2022      Chemistry      Component Value Date/Time   NA 139 05/04/2022 0333   NA 135 04/16/2020 1703   K 4.1 05/04/2022 0333   CL 108 05/04/2022 0333   CO2 20 (L) 05/04/2022 0333   BUN 14 05/04/2022 0333   BUN 20 04/16/2020 1703   CREATININE 1.05 (H) 05/04/2022 0333   CREATININE 1.16 (H) 04/05/2022 1322      Component Value Date/Time   CALCIUM 8.5 (L) 05/04/2022 0333   ALKPHOS 72 04/05/2022 1322   AST 73 (H) 04/05/2022 1322  ALT 103 (H) 04/05/2022 1322   BILITOT 0.4 04/05/2022 1322       RADIOGRAPHIC STUDIES:  US RENAL  Result Date: 05-10-2022 CLINICAL DATA:  Renal dysfunction EXAM: RENAL / URINARY TRACT ULTRASOUND COMPLETE COMPARISON:  CT done on 03/15/2011 FINDINGS: Right Kidney: Renal measurements: 9.1 x 5.9 x 6 cm = volume: 170 mL. There is no hydronephrosis. There is increased cortical echogenicity. There is a 6 mm cyst in the margin. Left Kidney: Renal measurements: 9.1 x 5.6 x 4.3 cm. There is no hydronephrosis. There is increased cortical echogenicity. Portions of the kidneys are not adequately visualized due to the patient's body habitus and adjacent bowel gas. Bladder: Urinary bladder is not distended. Other: None. IMPRESSION: There is no hydronephrosis. Increased cortical echogenicity suggest medical renal disease. Portions of left kidney could not be adequately  visualized due to technical difficulties. Electronically Signed   By: Elmer Picker M.D.   On: 05/10/22 09:26   DG Sacrum/Coccyx  Result Date: 05/02/2022 CLINICAL DATA:  Pt complained of tailbone pain after a fall two days ago onto her bottom. Pt reported radiating pain down her right leg. EXAM: SACRUM AND COCCYX - 2+ VIEW COMPARISON:  None Available. FINDINGS: No convincing fracture.  No bone lesion. SI joints are normally spaced and aligned. Soft tissues are unremarkable. IMPRESSION: Negative. Electronically Signed   By: Lajean Manes M.D.   On: 05/02/2022 11:19     ASSESSMENT/PLAN:  This is a very pleasant 87 year old African-American female being followed by the clinic for anemia, anemia felt to be multifactorial.  At the patient's last appointment, Dr. Julien Nordmann noted that the patient does have some chronic kidney disease.  Dr. Julien Nordmann would not recommend EPO injections unless she had new or worsening anemia(HBG 8 or less) as the patient's hemoglobin was around 10 at her last visit.  Dr. Julien Nordmann recommended Valinda Hoar plus. She is compliant ***  The patient is here for a 2 month follow up. She had a repeat CBC which showed ***   Recommend ***.   F/U? Her EPO was high.   The patient was advised to call immediately if she has any concerning symptoms in the interval. The patient voices understanding of current disease status and treatment options and is in agreement with the current care plan. All questions were answered. The patient knows to call the clinic with any problems, questions or concerns. We can certainly see the patient much sooner if necessary   No orders of the defined types were placed in this encounter.    I spent {CHL ONC TIME VISIT - ZSWFU:[id number]} counseling the patient face to face. The total time spent in the appointment was {CHL ONC TIME VISIT - GURKY:[id number]}.  Chestina Komatsu L Cena Bruhn, PA-C 05/31/22

## 2022-06-02 DIAGNOSIS — S0101XD Laceration without foreign body of scalp, subsequent encounter: Secondary | ICD-10-CM | POA: Diagnosis not present

## 2022-06-02 DIAGNOSIS — I129 Hypertensive chronic kidney disease with stage 1 through stage 4 chronic kidney disease, or unspecified chronic kidney disease: Secondary | ICD-10-CM | POA: Diagnosis not present

## 2022-06-02 DIAGNOSIS — N1832 Chronic kidney disease, stage 3b: Secondary | ICD-10-CM | POA: Diagnosis not present

## 2022-06-02 DIAGNOSIS — E1122 Type 2 diabetes mellitus with diabetic chronic kidney disease: Secondary | ICD-10-CM | POA: Diagnosis not present

## 2022-06-07 ENCOUNTER — Inpatient Hospital Stay: Payer: Medicare PPO | Attending: Physician Assistant

## 2022-06-07 ENCOUNTER — Inpatient Hospital Stay: Payer: Medicare PPO | Admitting: Physician Assistant

## 2022-06-07 ENCOUNTER — Telehealth: Payer: Self-pay | Admitting: Physician Assistant

## 2022-06-07 NOTE — Telephone Encounter (Signed)
The patient had an appointment scheduled for today. I called her. She forgot about her appointment. She prefers if her daughter, Santiago Glad, is the point of contact for her appointments. She states Tuesdays/Wednesdays are better for her. I will send a scheduling message to reschedule her missed appointment.

## 2022-06-08 ENCOUNTER — Telehealth: Payer: Self-pay | Admitting: Physician Assistant

## 2022-06-08 NOTE — Telephone Encounter (Signed)
Contacted patient to scheduled appointments. Left message with appointment details and a call back number if patient had any questions or could not accommodate the time we provided.   

## 2022-06-09 ENCOUNTER — Ambulatory Visit: Payer: Medicare PPO | Admitting: Physician Assistant

## 2022-06-09 ENCOUNTER — Other Ambulatory Visit: Payer: Medicare PPO

## 2022-06-16 DIAGNOSIS — R3121 Asymptomatic microscopic hematuria: Secondary | ICD-10-CM | POA: Diagnosis not present

## 2022-06-25 DIAGNOSIS — Z794 Long term (current) use of insulin: Secondary | ICD-10-CM | POA: Diagnosis not present

## 2022-06-25 DIAGNOSIS — E890 Postprocedural hypothyroidism: Secondary | ICD-10-CM | POA: Diagnosis not present

## 2022-06-25 DIAGNOSIS — Z79890 Hormone replacement therapy (postmenopausal): Secondary | ICD-10-CM | POA: Diagnosis not present

## 2022-06-25 DIAGNOSIS — Z7985 Long-term (current) use of injectable non-insulin antidiabetic drugs: Secondary | ICD-10-CM | POA: Diagnosis not present

## 2022-06-25 DIAGNOSIS — E1329 Other specified diabetes mellitus with other diabetic kidney complication: Secondary | ICD-10-CM | POA: Diagnosis not present

## 2022-06-25 DIAGNOSIS — R809 Proteinuria, unspecified: Secondary | ICD-10-CM | POA: Diagnosis not present

## 2022-06-25 NOTE — Progress Notes (Deleted)
[facility] OFFICE PROGRESS NOTE  Deland Pretty, MD [place] [address]  DIAGNOSIS: Anemia  PRIOR THERAPY: None   CURRENT THERAPY: Integra plus  INTERVAL HISTORY: [name] 87 y.o. female returns to clinic today for follow-up visit.  The patient was first seen in the clinic on 04/05/2022.  At that time, the patient was referred to the clinic for worsening anemia.  She did have acute worsening in anemia after a hospital admission in April 2023.  She then presented to the hospital again in late April 2023 for worsening anemia and she was admitted for possible GI bleed due to being Hemoccult positive.  She was reporting dark sticky stools and bright red blood per rectum.  Throughout the course of her hospitalization, she was seen by GI, Dr. Fuller Plan and underwent EGD and colonoscopy.  No active bleeding was noted.  Dr. Fuller Plan noted a solitary ulcer in the rectum.  Of note the patient also has some degree of chronic kidney disease.  Dr. Julien Nordmann recommended that she start an iron supplement with Integra plus at her last appointment.  Since last being seen, the patient is feeling ***.  Sees her PCP for thyroid dysfunction?  Ongoing weakness.  She denies any significant dyspnea on exertion she reports baseline cold intolerance.  She denies any recent syncopal episodes.  Denies any known bleeding or bruising.  Denies any chest pain.  She is here today for evaluation and repeat blood work.    MEDICAL HISTORY: Past Medical History:  Diagnosis Date   Colon polyp    Diabetes mellitus    Hyperlipidemia    Hypertension    Poor appetite    Thyroid disease     ALLERGIES:  has No Known Allergies.  MEDICATIONS:  Current Outpatient Medications  Medication Sig Dispense Refill   amiodarone (PACERONE) 200 MG tablet Take 1 tablet (200 mg total) by mouth daily. Direction change 90 tablet 2   ELIQUIS 2.5 MG TABS tablet Take 2.5 mg by mouth 2 (two) times daily.      FeFum-FePoly-FA-B Cmp-C-Biot (INTEGRA PLUS) CAPS Take 1 capsule by mouth every morning. 30 capsule 2   insulin aspart (NOVOLOG FLEXPEN) 100 UNIT/ML FlexPen Inject 8 Units into the skin 3 (three) times daily with meals. 15 mL 0   insulin glargine, 2 Unit Dial, (TOUJEO MAX SOLOSTAR) 300 UNIT/ML Solostar Pen Inject 14 Units into the skin at bedtime. 15 mL 0   KLOR-CON M20 20 MEQ tablet Take 20 mEq by mouth daily.     levothyroxine (SYNTHROID) 88 MCG tablet Take 1 tablet (88 mcg total) by mouth daily before breakfast. 30 tablet 2   mirtazapine (REMERON) 15 MG tablet Take 15 mg by mouth at bedtime.     pantoprazole (PROTONIX) 40 MG tablet Take 1 tablet (40 mg total) by mouth daily at 6 (six) AM. 30 tablet 1   polyethylene glycol (MIRALAX / GLYCOLAX) 17 g packet Take 17 g by mouth daily. 30 each 1   senna-docusate (SENOKOT-S) 8.6-50 MG tablet Take 2 tablets by mouth at bedtime. 60 tablet 2   TIADYLT ER 180 MG 24 hr capsule TAKE 1 CAPSULE BY MOUTH EVERY DAY (Patient taking differently: Take 180 mg by mouth daily.) 90 capsule 3   TRULICITY 2.33 AQ/7.6AU SOPN Inject 1.5 mg into the skin once a week. sundys     No current facility-administered medications for this visit.    SURGICAL HISTORY:  Past Surgical History:  Procedure Laterality Date  BIOPSY  02/07/2022   Procedure: BIOPSY;  Surgeon: Ladene Artist, MD;  Location: [facility] ENDOSCOPY;  Service: Gastroenterology;;   BREAST EXCISIONAL BIOPSY Right    CARDIOVASCULAR STRESS TEST  07/15/2006   Normal scan, no ECG changes   COLONOSCOPY WITH PROPOFOL N/A 02/07/2022   Procedure: COLONOSCOPY WITH PROPOFOL;  Surgeon: Ladene Artist, MD;  Location: [place];  Service: Gastroenterology;  Laterality: N/A;   ESOPHAGOGASTRODUODENOSCOPY (EGD) WITH PROPOFOL N/A 02/07/2022   Procedure: ESOPHAGOGASTRODUODENOSCOPY (EGD) WITH PROPOFOL;  Surgeon: Ladene Artist, MD;  Location: [place];  Service: Gastroenterology;  Laterality: N/A;   TRANSTHORACIC  ECHOCARDIOGRAM  01/08/2011   EF 60-65%, moderae mitral regurg, LA mild-moderately dilated,    REVIEW OF SYSTEMS:   Review of Systems  Constitutional: Negative for appetite change, chills, fatigue, fever and unexpected weight change.  HENT:   Negative for mouth sores, nosebleeds, sore throat and trouble swallowing.   Eyes: Negative for eye problems and icterus.  Respiratory: Negative for cough, hemoptysis, shortness of breath and wheezing.   Cardiovascular: Negative for chest pain and leg swelling.  Gastrointestinal: Negative for abdominal pain, constipation, diarrhea, nausea and vomiting.  Genitourinary: Negative for bladder incontinence, difficulty urinating, dysuria, frequency and hematuria.   Musculoskeletal: Negative for back pain, gait problem, neck pain and neck stiffness.  Skin: Negative for itching and rash.  Neurological: Negative for dizziness, extremity weakness, gait problem, headaches, light-headedness and seizures.  Hematological: Negative for adenopathy. Does not bruise/bleed easily.  Psychiatric/Behavioral: Negative for confusion, depression and sleep disturbance. The patient is not nervous/anxious.     PHYSICAL EXAMINATION:  There were no vitals taken for this visit.  ECOG PERFORMANCE STATUS: {CHL ONC ECOG Q3448304  Physical Exam  Constitutional: Oriented to person, place, and time and well-developed, well-nourished, and in no distress. No distress.  HENT:  Head: Normocephalic and atraumatic.  Mouth/Throat: Oropharynx is clear and moist. No oropharyngeal exudate.  Eyes: Conjunctivae are normal. Right eye exhibits no discharge. Left eye exhibits no discharge. No scleral icterus.  Neck: Normal range of motion. Neck supple.  Cardiovascular: Normal rate, regular rhythm, normal heart sounds and intact distal pulses.   Pulmonary/Chest: Effort normal and breath sounds normal. No respiratory distress. No wheezes. No rales.  Abdominal: Soft. Bowel sounds are normal.  Exhibits no distension and no mass. There is no tenderness.  Musculoskeletal: Normal range of motion. Exhibits no edema.  Lymphadenopathy:    No cervical adenopathy.  Neurological: Alert and oriented to person, place, and time. Exhibits normal muscle tone. Gait normal. Coordination normal.  Skin: Skin is warm and dry. No rash noted. Not diaphoretic. No erythema. No pallor.  Psychiatric: Mood, memory and judgment normal.  Vitals reviewed.  LABORATORY DATA: Lab Results  Component Value Date   WBC 6.2 05/04/2022   HGB 11.1 (L) 05/04/2022   HCT 34.6 (L) 05/04/2022   MCV 86.7 05/04/2022   PLT 180 05/04/2022      Chemistry      Component Value Date/Time   NA 139 05/04/2022 0333   NA 135 04/16/2020 1703   K 4.1 05/04/2022 0333   CL 108 05/04/2022 0333   CO2 20 (L) 05/04/2022 0333   BUN 14 05/04/2022 0333   BUN 20 04/16/2020 1703   CREATININE 1.05 (H) 05/04/2022 0333   CREATININE 1.16 (H) 04/05/2022 1322      Component Value Date/Time   CALCIUM 8.5 (L) 05/04/2022 0333   ALKPHOS 72 04/05/2022 1322   AST 73 (H) 04/05/2022 1322   ALT 103 (H)  04/05/2022 1322   BILITOT 0.4 04/05/2022 1322       RADIOGRAPHIC STUDIES:  No results found.   ASSESSMENT/PLAN:  ASSESSMENT/PLAN:  This is a very pleasant 87 year old African-American female being followed by the clinic for anemia, anemia felt to be multifactorial.  At the patient's last appointment, Dr. Julien Nordmann noted that the patient does have some chronic kidney disease.  Dr. Julien Nordmann would not recommend EPO injections unless she had new or worsening anemia(HBG 8 or less) as the patient's hemoglobin was around 10 at her last visit.  Dr. Julien Nordmann recommended Valinda Hoar plus. She is compliant ***  The patient is here for a 2 month follow up. She had a repeat CBC which showed ***   Recommend ***.   F/U? Her EPO was high.   The patient was advised to call immediately if she has any concerning symptoms in the interval. The patient  voices understanding of current disease status and treatment options and is in agreement with the current care plan. All questions were answered. The patient knows to call the clinic with any problems, questions or concerns. We can certainly see the patient much sooner if necessary   No orders of the defined types were placed in this encounter.    I spent {CHL ONC TIME VISIT - VJKQA:[id number]} counseling the patient face to face. The total time spent in the appointment was {CHL ONC TIME VISIT - HKFEX:[id number]}.  Karen Shadowens L Jarmal Lewelling, PA-C 06/25/22

## 2022-06-29 ENCOUNTER — Inpatient Hospital Stay: Payer: Medicare PPO | Admitting: Physician Assistant

## 2022-06-29 ENCOUNTER — Inpatient Hospital Stay: Payer: Medicare PPO | Attending: Physician Assistant

## 2022-07-07 DIAGNOSIS — Z794 Long term (current) use of insulin: Secondary | ICD-10-CM | POA: Diagnosis not present

## 2022-07-07 DIAGNOSIS — E1365 Other specified diabetes mellitus with hyperglycemia: Secondary | ICD-10-CM | POA: Diagnosis not present

## 2022-07-07 DIAGNOSIS — E139 Other specified diabetes mellitus without complications: Secondary | ICD-10-CM | POA: Diagnosis not present

## 2022-07-07 DIAGNOSIS — Z7985 Long-term (current) use of injectable non-insulin antidiabetic drugs: Secondary | ICD-10-CM | POA: Diagnosis not present

## 2022-07-08 DIAGNOSIS — E1065 Type 1 diabetes mellitus with hyperglycemia: Secondary | ICD-10-CM | POA: Diagnosis not present

## 2022-07-16 ENCOUNTER — Other Ambulatory Visit: Payer: Self-pay | Admitting: Physician Assistant

## 2022-07-16 ENCOUNTER — Ambulatory Visit: Payer: Medicare PPO | Attending: Internal Medicine | Admitting: Internal Medicine

## 2022-07-16 DIAGNOSIS — D509 Iron deficiency anemia, unspecified: Secondary | ICD-10-CM

## 2022-07-18 ENCOUNTER — Encounter (HOSPITAL_BASED_OUTPATIENT_CLINIC_OR_DEPARTMENT_OTHER): Payer: Self-pay | Admitting: Emergency Medicine

## 2022-07-18 ENCOUNTER — Emergency Department (HOSPITAL_BASED_OUTPATIENT_CLINIC_OR_DEPARTMENT_OTHER)
Admission: EM | Admit: 2022-07-18 | Discharge: 2022-07-19 | Disposition: A | Discharge status: Home / Self Care | Payer: Medicare PPO | Attending: Emergency Medicine | Admitting: Emergency Medicine

## 2022-07-18 ENCOUNTER — Encounter (HOSPITAL_COMMUNITY): Payer: Self-pay

## 2022-07-18 DIAGNOSIS — I129 Hypertensive chronic kidney disease with stage 1 through stage 4 chronic kidney disease, or unspecified chronic kidney disease: Secondary | ICD-10-CM | POA: Diagnosis not present

## 2022-07-18 DIAGNOSIS — E1010 Type 1 diabetes mellitus with ketoacidosis without coma: Secondary | ICD-10-CM | POA: Diagnosis not present

## 2022-07-18 DIAGNOSIS — Z794 Long term (current) use of insulin: Secondary | ICD-10-CM | POA: Insufficient documentation

## 2022-07-18 DIAGNOSIS — N189 Chronic kidney disease, unspecified: Secondary | ICD-10-CM | POA: Insufficient documentation

## 2022-07-18 DIAGNOSIS — N179 Acute kidney failure, unspecified: Secondary | ICD-10-CM | POA: Diagnosis not present

## 2022-07-18 DIAGNOSIS — R350 Frequency of micturition: Secondary | ICD-10-CM | POA: Insufficient documentation

## 2022-07-18 DIAGNOSIS — R739 Hyperglycemia, unspecified: Secondary | ICD-10-CM | POA: Diagnosis present

## 2022-07-18 DIAGNOSIS — I4891 Unspecified atrial fibrillation: Secondary | ICD-10-CM | POA: Diagnosis not present

## 2022-07-18 DIAGNOSIS — I480 Paroxysmal atrial fibrillation: Secondary | ICD-10-CM | POA: Insufficient documentation

## 2022-07-18 DIAGNOSIS — E1110 Type 2 diabetes mellitus with ketoacidosis without coma: Secondary | ICD-10-CM | POA: Diagnosis present

## 2022-07-18 DIAGNOSIS — Z79899 Other long term (current) drug therapy: Secondary | ICD-10-CM | POA: Insufficient documentation

## 2022-07-18 LAB — CBC
HCT: 38.7 % (ref 36.0–46.0)
Hemoglobin: 12.5 g/dL (ref 12.0–15.0)
MCH: 27.7 pg (ref 26.0–34.0)
MCHC: 32.3 g/dL (ref 30.0–36.0)
MCV: 85.8 fL (ref 80.0–100.0)
Platelets: 176 10*3/uL (ref 150–400)
RBC: 4.51 MIL/uL (ref 3.87–5.11)
RDW: 15.6 % — ABNORMAL HIGH (ref 11.5–15.5)
WBC: 6.9 10*3/uL (ref 4.0–10.5)
nRBC: 0 % (ref 0.0–0.2)

## 2022-07-18 LAB — URINALYSIS, ROUTINE W REFLEX MICROSCOPIC
Bilirubin Urine: NEGATIVE
Glucose, UA: >=500 mg/dL — AB
Ketones, ur: 15 mg/dL — AB
Leukocytes,Ua: NEGATIVE
Nitrite: NEGATIVE
Protein, ur: NEGATIVE mg/dL
Specific Gravity, Urine: 1.01 (ref 1.005–1.030)
pH: 5 (ref 5.0–8.0)

## 2022-07-18 LAB — URINALYSIS, MICROSCOPIC (REFLEX)

## 2022-07-18 LAB — CBG MONITORING, ED
Glucose-Capillary: >600 mg/dL (ref 70–99)
Glucose-Capillary: 597 mg/dL (ref 70–99)
Glucose-Capillary: 426 mg/dL — ABNORMAL HIGH (ref 70–99)
Glucose-Capillary: 481 mg/dL — ABNORMAL HIGH (ref 70–99)
Glucose-Capillary: 371 mg/dL — ABNORMAL HIGH (ref 70–99)
Glucose-Capillary: 267 mg/dL — ABNORMAL HIGH (ref 70–99)
Glucose-Capillary: 231 mg/dL — ABNORMAL HIGH (ref 70–99)

## 2022-07-18 LAB — BASIC METABOLIC PANEL
Anion gap: 15 (ref 5–15)
BUN: 32 mg/dL — ABNORMAL HIGH (ref 8–23)
CO2: 17 mmol/L — ABNORMAL LOW (ref 22–32)
Calcium: 8.8 mg/dL — ABNORMAL LOW (ref 8.9–10.3)
Chloride: 95 mmol/L — ABNORMAL LOW (ref 98–111)
Creatinine, Ser: 1.45 mg/dL — ABNORMAL HIGH (ref 0.44–1.00)
GFR, Estimated: 35 mL/min — ABNORMAL LOW (ref >60)
Glucose, Bld: 759 mg/dL (ref 70–99)
Potassium: 5.7 mmol/L — ABNORMAL HIGH (ref 3.5–5.1)
Sodium: 127 mmol/L — ABNORMAL LOW (ref 135–145)

## 2022-07-18 LAB — BETA-HYDROXYBUTYRIC ACID: Beta-Hydroxybutyric Acid: 4.12 mmol/L — ABNORMAL HIGH (ref 0.05–0.27)

## 2022-07-18 MED ORDER — LACTATED RINGERS IV SOLN
INTRAVENOUS | Status: DC
  Administered 2022-07-18: 125 mL/h via INTRAVENOUS

## 2022-07-18 MED ORDER — MIRTAZAPINE 15 MG PO TABS
15.0000 mg | ORAL_TABLET | Freq: Every day | ORAL | Status: DC
  Filled 2022-07-18: qty 1

## 2022-07-18 MED ORDER — DEXTROSE IN LACTATED RINGERS 5 % IV SOLN: INTRAVENOUS | Status: DC

## 2022-07-18 MED ORDER — DEXTROSE 50 % IV SOLN: 0.0000 mL | INTRAVENOUS | Status: DC | PRN

## 2022-07-18 MED ORDER — SENNOSIDES-DOCUSATE SODIUM 8.6-50 MG PO TABS
2.0000 | ORAL_TABLET | Freq: Every day | ORAL | Status: DC
  Administered 2022-07-19: 2 via ORAL
  Filled 2022-07-18: qty 2

## 2022-07-18 MED ORDER — LACTATED RINGERS IV BOLUS
1000.0000 mL | INTRAVENOUS | Status: AC
  Administered 2022-07-18: 1000 mL via INTRAVENOUS

## 2022-07-18 MED ORDER — APIXABAN 2.5 MG PO TABS
2.5000 mg | ORAL_TABLET | Freq: Two times a day (BID) | ORAL | Status: DC
  Administered 2022-07-19 (×2): 2.5 mg via ORAL
  Filled 2022-07-18 (×2): qty 1

## 2022-07-18 MED ORDER — AMIODARONE HCL 200 MG PO TABS
200.0000 mg | ORAL_TABLET | Freq: Every day | ORAL | Status: DC
  Administered 2022-07-19: 200 mg via ORAL
  Filled 2022-07-18: qty 1

## 2022-07-18 MED ORDER — DILTIAZEM HCL ER COATED BEADS 180 MG PO CP24
180.0000 mg | ORAL_CAPSULE | Freq: Every day | ORAL | Status: DC
  Filled 2022-07-18: qty 1

## 2022-07-18 MED ORDER — LEVOTHYROXINE SODIUM 88 MCG PO TABS
88.0000 ug | ORAL_TABLET | Freq: Every day | ORAL | Status: DC
  Filled 2022-07-18: qty 1

## 2022-07-18 MED ORDER — POLYETHYLENE GLYCOL 3350 17 G PO PACK
17.0000 g | PACK | Freq: Every day | ORAL | Status: DC
  Administered 2022-07-19: 17 g via ORAL
  Filled 2022-07-18: qty 1

## 2022-07-18 MED ORDER — SODIUM CHLORIDE 0.9 % IV BOLUS: 1000.0000 mL | Freq: Once | INTRAVENOUS | Status: DC

## 2022-07-18 MED ORDER — PANTOPRAZOLE SODIUM 40 MG PO TBEC
40.0000 mg | DELAYED_RELEASE_TABLET | Freq: Every day | ORAL | Status: DC
  Administered 2022-07-19: 40 mg via ORAL
  Filled 2022-07-18: qty 1

## 2022-07-18 MED ORDER — INSULIN REGULAR(HUMAN) IN NACL 100-0.9 UT/100ML-% IV SOLN
INTRAVENOUS | Status: DC
Start: 2022-07-18 — End: 2022-07-19
  Administered 2022-07-18: 6 [IU]/h via INTRAVENOUS
  Filled 2022-07-18: qty 100

## 2022-07-18 NOTE — ED Notes (Signed)
Iv in left Ac was started by Dr. Tamera Punt and Iv in left hand was started by [facility]

## 2022-07-18 NOTE — ED Notes (Signed)
Provider got iv access

## 2022-07-18 NOTE — Progress Notes (Signed)
Transferring facility: [facility] Requesting provider: Quentin Mulling, PA (EDP at [facility]) Reason for transfer: admission for further evaluation and management of suspected dka, although VBG, beta hydroxybutyric acid level pending at this time.    87 year old F with medical history notable for latent onset autoimmune diabetes, who presented to [place] ED  complaining of hyperglycemia per home glucometer over the last 2 days in spite of good compliance with home basal and short acting insulin regimen.  Associated with recent polyuria, but otherwise no report of acute symptoms.   Initial blood sugar in the emergency department found to be 759, with CMP revealing bicarbonate 17, anion gap 15.  VBG and beta hydroxybutyric acid results currently pending.  Vital signs in the ED were reported to be stable, including afebrile.  No overt underlying source of infection identified at this time.   Started on IV fluids and insulin drip via Endo tool.    Subsequently, I accepted this patient for transfer for inpatient admission to a SDU bed at [facility] or [facility] (first available) for further work-up and management of hyperglycemia with suspected DKA.      Check [email or web address] for on-call coverage.   Nursing staff, Please call [place] number on Amion as soon as patient's arrival, so appropriate admitting provider can evaluate the pt.     Babs Bertin, DO Hospitalist

## 2022-07-18 NOTE — ED Notes (Addendum)
Provider states that patient does not need a bmp after the bolus states to start endo tool insulin drip

## 2022-07-18 NOTE — ED Notes (Signed)
Insulin medication has not been started due to not having iv access provider is at bedside now

## 2022-07-18 NOTE — ED Provider Notes (Signed)
EMERGENCY DEPARTMENT  US GUIDANCE EXAM Emergency Ultrasound:  US Guidance for Needle Guidance  INDICATIONS: Difficult vascular access Linear probe used in real-time to visualize location of needle entry through skin.   PERFORMED BY: Myself IMAGES ARCHIVED?: Yes LIMITATIONS: None VIEWS USED: Transverse and Longitudinal INTERPRETATION: Needle visualized within vein and Right arm    Karen Johns, MD 07/18/22 1839

## 2022-07-18 NOTE — ED Notes (Signed)
Provider still at bedside trying to get iv access

## 2022-07-18 NOTE — ED Triage Notes (Signed)
Pt reports glucose has been high recently; sts reading "High" on glucometer at home; also reports UTI sxs that have not improved with abx; + urinary frequency

## 2022-07-18 NOTE — ED Notes (Signed)
Rn tired to start iv was unsuccessful. Charge nurse tried to start iv was unsuccessful also

## 2022-07-18 NOTE — ED Notes (Signed)
Please call dtg Santiago Glad at home #

## 2022-07-18 NOTE — ED Notes (Signed)
ED Provider at bedside to attempt [place] IV

## 2022-07-18 NOTE — ED Provider Notes (Cosign Needed Addendum)
[facility] Provider Note   CSN: [id number] Arrival date & time: 07/18/22  1517     History  Chief Complaint  Patient presents with   Hyperglycemia    [name] is a 87 y.o. 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  Patient reports that her blood sugar has read high for the last 2 days, she has been taking her normal 18 units long-acting as well as 8 units with meals plus sliding scale as directed, but reports that it is not significantly improving her blood sugar, although it sounds like her adjustment for high blood sugar is only to add 1 to 2 units as needed, without more significant adjustment factor.  Patient reports that she has been dealing with dysuria and urinary frequency intermittently, went to see urologist and had cystoscopy, which showed some kind of a cyst or other mass, reports that she was treated with antibiotics for 5 days, does still continue to have symptoms, reports that she has a follow-up appointment October 21 for her dysuria.  She denies any fever, chills, abdominal pain.  She reports that she is trying to eat a healthy diet for her diabetes, is not eating excessively sugary foods, sodas, or simple starches.  Denies any missed doses of her insulin.   Hyperglycemia      Home Medications Prior to Admission medications   Medication Sig Start Date End Date Taking? Authorizing Provider  amiodarone (PACERONE) 200 MG tablet Take 1 tablet (200 mg total) by mouth daily. Direction change 05/04/22   Mercy Riding, MD  ELIQUIS 2.5 MG TABS tablet Take 2.5 mg by mouth 2 (two) times daily. 04/15/22   [provider]  FeFum-FePoly-FA-B Cmp-C-Biot (FOLIVANE-PLUS) CAPS TAKE 1 CAPSULE BY MOUTH EVERY DAY IN THE MORNING 07/18/22   Heilingoetter, Cassandra L, PA-C  insulin aspart (NOVOLOG FLEXPEN) 100  UNIT/ML FlexPen Inject 8 Units into the skin 3 (three) times daily with meals. 09/04/21   Mariel Aloe, MD  insulin glargine, 2 Unit Dial, (TOUJEO MAX SOLOSTAR) 300 UNIT/ML Solostar Pen Inject 14 Units into the skin at bedtime. 05/04/22   Mercy Riding, MD  KLOR-CON M20 20 MEQ tablet Take 20 mEq by mouth daily. 02/02/22   [provider]  levothyroxine (SYNTHROID) 88 MCG tablet Take 1 tablet (88 mcg total) by mouth daily before breakfast. 02/08/22   Bonnielee Haff, MD  mirtazapine (REMERON) 15 MG tablet Take 15 mg by mouth at bedtime. 05/01/22   [provider]  pantoprazole (PROTONIX) 40 MG tablet Take 1 tablet (40 mg total) by mouth daily at 6 (six) AM. 02/09/22   Bonnielee Haff, MD  polyethylene glycol (MIRALAX / GLYCOLAX) 17 g packet Take 17 g by mouth daily. 02/09/22   Bonnielee Haff, MD  senna-docusate (SENOKOT-S) 8.6-50 MG tablet Take 2 tablets by mouth at bedtime. 02/08/22   Bonnielee Haff, MD  TIADYLT ER 180 MG 24 hr capsule TAKE 1 CAPSULE BY MOUTH EVERY DAY Patient taking differently: Take 180 mg by mouth daily. 11/27/21   Hilty, Nadean Corwin, MD  TRULICITY 6.16 WV/3.7TG SOPN Inject 1.5 mg into the skin once a week. sundys 05/01/22   [provider]      Allergies    Patient has no known allergies.    Review of Systems   Review of Systems  All other systems reviewed and are negative.   Physical  Exam Updated Vital Signs BP (!) 121/54   Pulse 65   Temp 98.1 F (36.7 C) (Oral)   Resp 18   Ht '5\' 1"'$  (1.549 m)   Wt 59.9 kg   SpO2 98%   BMI 24.94 kg/m  Physical Exam Vitals and nursing note reviewed.  Constitutional:      General: She is not in acute distress.    Appearance: Normal appearance.  HENT:     Head: Normocephalic and atraumatic.     Mouth/Throat:     Mouth: Mucous membranes are dry.  Eyes:     General:        Right eye: No discharge.        Left eye: No discharge.  Cardiovascular:     Rate and Rhythm: Normal rate and regular rhythm.      Heart sounds: No murmur heard.    No friction rub. No gallop.  Pulmonary:     Effort: Pulmonary effort is normal.     Breath sounds: Normal breath sounds.  Abdominal:     General: Bowel sounds are normal.     Palpations: Abdomen is soft.     Comments: No significant tenderness to palpation of the abdomen  Skin:    General: Skin is warm and dry.     Capillary Refill: Capillary refill takes less than 2 seconds.  Neurological:     Mental Status: She is alert and oriented to person, place, and time.  Psychiatric:        Mood and Affect: Mood normal.        Behavior: Behavior normal.     ED Results / Procedures / Treatments   Labs (all labs ordered are listed, but only abnormal results are displayed) Labs Reviewed  BASIC METABOLIC PANEL - Abnormal; Notable for the following components:      Result Value   Sodium 127 (*)    Potassium 5.7 (*)    Chloride 95 (*)    CO2 17 (*)    Glucose, Bld 759 (*)    BUN 32 (*)    Creatinine, Ser 1.45 (*)    Calcium 8.8 (*)    GFR, Estimated 35 (*)    All other components within normal limits  CBC - Abnormal; Notable for the following components:   RDW 15.6 (*)    All other components within normal limits  URINALYSIS, ROUTINE W REFLEX MICROSCOPIC - Abnormal; Notable for the following components:   Color, Urine STRAW (*)    Glucose, UA >=500 (*)    Hgb urine dipstick SMALL (*)    Ketones, ur 15 (*)    All other components within normal limits  URINALYSIS, MICROSCOPIC (REFLEX) - Abnormal; Notable for the following components:   Bacteria, UA RARE (*)    All other components within normal limits  BETA-HYDROXYBUTYRIC ACID - Abnormal; Notable for the following components:   Beta-Hydroxybutyric Acid 4.12 (*)    All other components within normal limits  CBG MONITORING, ED - Abnormal; Notable for the following components:   Glucose-Capillary >600 (*)    All other components within normal limits  CBG MONITORING, ED - Abnormal; Notable for the  following components:   Glucose-Capillary 597 (*)    All other components within normal limits  CBG MONITORING, ED - Abnormal; Notable for the following components:   Glucose-Capillary 481 (*)    All other components within normal limits  CBG MONITORING, ED - Abnormal; Notable for the following components:   Glucose-Capillary 426 (*)  All other components within normal limits  CBG MONITORING, ED - Abnormal; Notable for the following components:   Glucose-Capillary 371 (*)    All other components within normal limits  CBG MONITORING, ED - Abnormal; Notable for the following components:   Glucose-Capillary 267 (*)    All other components within normal limits  URINE CULTURE  BLOOD GAS, VENOUS  BETA-HYDROXYBUTYRIC ACID  BETA-HYDROXYBUTYRIC ACID    EKG None  Radiology No results found.  Procedures .Critical Care  Performed by: Anselmo Pickler, PA-C Authorized by: Anselmo Pickler, PA-C   Critical care provider statement:    Critical care time (minutes):  30   Critical care was necessary to treat or prevent imminent or life-threatening deterioration of the following conditions:  Endocrine crisis (DKA requiring insulin drip)   Critical care was time spent personally by me on the following activities:  Development of treatment plan with patient or surrogate, discussions with consultants, evaluation of patient's response to treatment, examination of patient, ordering and review of laboratory studies, ordering and review of radiographic studies, ordering and performing treatments and interventions, pulse oximetry, re-evaluation of patient's condition and review of old charts   Care discussed with: admitting provider       Medications Ordered in ED Medications  insulin regular, human (MYXREDLIN) 100 units/ 100 mL infusion (6 Units/hr Intravenous New Bag/Given 07/18/22 1904)  lactated ringers infusion (0 mLs Intravenous Stopped 07/18/22 1807)  dextrose 5 % in lactated  ringers infusion (has no administration in time range)  dextrose 50 % solution 0-50 mL (has no administration in time range)  lactated ringers bolus 1,000 mL (0 mLs Intravenous Stopped 07/18/22 1900)    ED Course/ Medical Decision Making/ A&P                           Medical Decision Making Amount and/or Complexity of Data Reviewed Labs: ordered.  Risk Prescription drug management. Decision regarding hospitalization.   This patient is a 87 y.o. female who presents to the ED for concern of elevated blood sugar, polyuria, this involves an extensive number of treatment options, and is a complaint that carries with it a high risk of complications and morbidity. The emergent differential diagnosis prior to evaluation includes, but is not limited to, DKA, HHS, or other hyperglycemia, hyperglycemia caused by underlying infection such as UTI, pneumonia, or other intra-abdominal infection.   This is not an exhaustive differential.   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 Pertinent results include: Reviewed lab work, imaging from recent previous emergency department visits, outpatient endocrinology and cardiology visits  Physical Exam: Physical exam performed. The pertinent findings include: Patient is quite dry on exam but no significant abdominal tenderness to palpation, otherwise stable vital signs in the emergency department, mild hypertension on arrival of 150/80  Lab Tests: I ordered, and personally interpreted labs.  The pertinent results include: Patient with BMP suggestive of developing DKA with pseudohyponatremia, sodium 127, however corrected in normal range, she does have some hyperkalemia potassium 5.7, we will address with insulin replete meant, not addressed primarily at this time.  Will likely return to normal after intracellular.  Patient does  have some evidence of developing acidosis with anion gap of 15, bicarb of 17.  Her initial glucose was 759.  She is experiencing an AKI, likely secondary to dehydration, creatinine 1.45, BUN 32.  Baseline around 1.05.  Urinalysis does show some white blood cell clumps, but only rare bacteria, no nitrates, leukocytes, some ketones noted.  Do not think this is directly presented of an acute urinary tract infection but we will send for culture.   Medications: I ordered medication including insulin, fluid bolus for today. Reevaluation of the patient after these medicines showed that the patient improved. I have reviewed the patients home medicines and have made adjustments as needed.  Consultations Obtained: I requested consultation with the hospitalist, spoke with Dr. Velia Meyer,  and discussed lab and imaging findings as well as pertinent plan - they recommend: Admission for DKA and AKI for continued monitoring and lab recheck   Disposition: After consideration of the diagnostic results and the patients response to treatment, I feel that patient would benefit from admission as discussed above.  Although patient's blood sugar is moving in a positive direction with insulin correction that she has new AKI and has had poorly controlled diabetes despite taking her medications as prescribed I think she would benefit from hospital admission for recheck of electrolytes, make sure her anion gap is closing, reevaluate kidney function and discussed with diabetes coordinator regarding more tight control of her disease process.  I discussed this case with my attending physician Dr. Tamera Punt who cosigned this note including patient's presenting symptoms, physical exam, and planned diagnostics and interventions. Attending physician stated agreement with plan or made changes to plan which were implemented.    Final Clinical Impression(s) / ED Diagnoses Final diagnoses:  Diabetic ketoacidosis without coma associated with  type 1 diabetes mellitus ([place])  AKI (acute kidney injury) [facility])    Rx / DC Orders ED Discharge Orders     None         Anselmo Pickler, PA-C 07/18/22 2224    Malvin Johns, MD 07/18/22 2243    Carlus Pavlov, Finneus Kaneshiro H, PA-C 07/18/22 2315    Malvin Johns, MD 07/20/22 1040

## 2022-07-18 NOTE — ED Notes (Addendum)
Patient states that she is voiding frequently.States that she is thirsty.Reports some dysuria

## 2022-07-18 NOTE — ED Notes (Signed)
Unsuccessful IV attempt LAC, labs collected.

## 2022-07-18 NOTE — ED Notes (Signed)
Frozen dinner given and ginger ale

## 2022-07-19 LAB — BASIC METABOLIC PANEL
Anion gap: 7 (ref 5–15)
Anion gap: 7 (ref 5–15)
BUN: 26 mg/dL — ABNORMAL HIGH (ref 8–23)
BUN: 24 mg/dL — ABNORMAL HIGH (ref 8–23)
CO2: 24 mmol/L (ref 22–32)
CO2: 23 mmol/L (ref 22–32)
Calcium: 8.6 mg/dL — ABNORMAL LOW (ref 8.9–10.3)
Calcium: 8.5 mg/dL — ABNORMAL LOW (ref 8.9–10.3)
Chloride: 103 mmol/L (ref 98–111)
Chloride: 104 mmol/L (ref 98–111)
Creatinine, Ser: 1.29 mg/dL — ABNORMAL HIGH (ref 0.44–1.00)
Creatinine, Ser: 1.03 mg/dL — ABNORMAL HIGH (ref 0.44–1.00)
GFR, Estimated: 41 mL/min — ABNORMAL LOW (ref >60)
GFR, Estimated: 53 mL/min — ABNORMAL LOW (ref >60)
Glucose, Bld: 334 mg/dL — ABNORMAL HIGH (ref 70–99)
Glucose, Bld: 246 mg/dL — ABNORMAL HIGH (ref 70–99)
Potassium: 4.1 mmol/L (ref 3.5–5.1)
Potassium: 4 mmol/L (ref 3.5–5.1)
Sodium: 134 mmol/L — ABNORMAL LOW (ref 135–145)
Sodium: 134 mmol/L — ABNORMAL LOW (ref 135–145)

## 2022-07-19 LAB — CBG MONITORING, ED
Glucose-Capillary: 114 mg/dL — ABNORMAL HIGH (ref 70–99)
Glucose-Capillary: 173 mg/dL — ABNORMAL HIGH (ref 70–99)
Glucose-Capillary: 191 mg/dL — ABNORMAL HIGH (ref 70–99)
Glucose-Capillary: 196 mg/dL — ABNORMAL HIGH (ref 70–99)
Glucose-Capillary: 218 mg/dL — ABNORMAL HIGH (ref 70–99)
Glucose-Capillary: 279 mg/dL — ABNORMAL HIGH (ref 70–99)
Glucose-Capillary: 289 mg/dL — ABNORMAL HIGH (ref 70–99)
Glucose-Capillary: 292 mg/dL — ABNORMAL HIGH (ref 70–99)
Glucose-Capillary: 73 mg/dL (ref 70–99)

## 2022-07-19 LAB — BETA-HYDROXYBUTYRIC ACID: Beta-Hydroxybutyric Acid: 0.09 mmol/L (ref 0.05–0.27)

## 2022-07-19 MED ORDER — DEXTROSE-NACL 5-0.9 % IV SOLN: INTRAVENOUS | Status: DC

## 2022-07-19 MED ORDER — INSULIN GLARGINE-YFGN 100 UNIT/ML ~~LOC~~ SOLN
12.0000 [IU] | Freq: Every day | SUBCUTANEOUS | Status: DC
  Administered 2022-07-19: 12 [IU] via SUBCUTANEOUS
  Filled 2022-07-19: qty 120

## 2022-07-19 MED ORDER — INSULIN GLARGINE-YFGN 100 UNIT/ML ~~LOC~~ SOLN
2.0000 [IU] | SUBCUTANEOUS | Status: DC
  Filled 2022-07-19: qty 20

## 2022-07-19 MED ORDER — LACTATED RINGERS IV SOLN: INTRAVENOUS | Status: DC

## 2022-07-19 MED ORDER — INSULIN ASPART 100 UNIT/ML IJ SOLN
0.0000 [IU] | Freq: Three times a day (TID) | INTRAMUSCULAR | Status: DC
  Administered 2022-07-19: 5 [IU] via SUBCUTANEOUS

## 2022-07-19 NOTE — Inpatient Diabetes Management (Addendum)
Inpatient Diabetes Program Recommendations  AACE/ADA: New Consensus Statement on Inpatient Glycemic Control (2015)  Target Ranges:  Prepandial:   less than 140 mg/dL      Peak postprandial:   less than 180 mg/dL (1-2 hours)      Critically ill patients:  140 - 180 mg/dL   Lab Results  Component Value Date   GLUCAP 191 (H) 07/19/2022   HGBA1C 12.2 (H) 05/03/2022    Review of Glycemic Control  Latest Reference Range & Units 07/18/22 16:57 07/19/22 01:20  Beta-Hydroxybutyric Acid 0.05 - 0.27 mmol/L 4.12 (H) 0.09  (H): Data is abnormally high  Diabetes history: DM1 (LADA) Outpatient Diabetes medications: Toujeo 14 units QHS Novolog 8 units TID and SSI, Trulicity 1.5 mg weekly, Freestyle Libre 2 Current orders for Inpatient glycemic control: Transitioned off of IV insulin to Semglee 12 units QD, Novolog 0-9 units TID   Inpatient Diabetes Program Recommendations:    Novolog 3 units TID with meals if consumes at least 50%  Addendum'@12'$ :02:  Spoke with Dr. Melina Copa; he is considering sending her home as her acidosis has cleared and requesting to go home.  She says her daughter Santiago Glad helps her with her diabetes.  Called and spoke with Santiago Glad and Ms. Betten's sister.  Confirmed above DM medications.  Santiago Glad states she only helps her mother with her pills.    Reviewed patient's current A1c of 12.2% (average blood glucose of 303 mg/dL) in July 2023 with daughter and sister.  Explained what a A1c is and what it measures. Also reviewed goal A1c with patient, importance of good glucose control @ home, and blood sugar goals.  Discussed long and short term complications of uncontrolled glucose.  They are not sure if she is giving herself her insulin as they are not with her all the time.  Ms. Chalupa lives on her own.  Her daughter comes to her house everyday after work.  Daughter says they will change her basal time from AM to PM so she can watch her give her Toujeo.    Called and spoke with Ms. Anastasia.   She also confirms above home medications.  She states she does not forget to take her insulins.  She says she has a [facility] endocrinologist at [facility].  Last visit was on 07/07/22 in [place].  Ms. Ashworth says she thinks her UTI contributed to her elevated glucose.  She wears a freestyle Libre 2 CGM.    Encouraged her to call her endocrinologist if her glucose begins to run high so she can make adjustments.  Ms. Canche states she does not drink any beverages with sugar and is mindful of CHO's.  Again,  encouraged her to contact her endocrinologist with concerns.    Will continue to follow while inpatient.  Thank you, Reche Dixon, MSN, [facility] Diabetes Coordinator Inpatient Diabetes Program [phone] (team pager from 8a-5p)

## 2022-07-19 NOTE — ED Notes (Signed)
Pt called out this RN asking if she is able to be discharged. This RN requested for ED provider Dr. Melina Copa to review chart. Speaking with diabetes coordinator to inquire if she can be discharged. Provider now at bedside.

## 2022-07-19 NOTE — ED Notes (Signed)
Discharge instructions reviewed with patient. Patient verbalizes understanding, no further questions at this time. Medications/prescriptions and follow up information provided. No acute distress noted at time of departure. Daughter, Santiago Glad called to pick up patient. Awaiting ride.

## 2022-07-19 NOTE — ED Notes (Signed)
Pt eating breakfast at this time.  

## 2022-07-19 NOTE — ED Notes (Signed)
Second 1065m bolus of LR was not given by previous RTherapist, sports EDP aware. Second bolus held at this time per EDP order.

## 2022-07-19 NOTE — Discharge Instructions (Signed)
You were seen in the emergency department for elevated blood sugars.  You were on insulin drip for a while and received fluids and your labs and symptoms improved.  Please continue your regular diabetes management and follow-up with your endocrinologist as scheduled.  Return to the emergency department if any worsening or concerning symptoms

## 2022-07-19 NOTE — ED Provider Notes (Signed)
Signout from Dr. Nicholes Stairs.  87 year old female with elevated blood sugars anion gap.  She was initially on insulin drip now has been off that and is on subcutaneous.  Gap is closed.  She feels back to baseline and wants to be discharged. Physical Exam  BP (!) 123/51 (BP Location: Left Arm)   Pulse 61   Temp 98.6 F (37 C) (Oral)   Resp 19   Ht '5\' 1"'$  (1.549 m)   Wt 59.9 kg   SpO2 95%   BMI 24.94 kg/m   Physical Exam  Procedures  Procedures  ED Course / MDM    Medical Decision Making Amount and/or Complexity of Data Reviewed Labs: ordered.  Risk OTC drugs. Prescription drug management. Decision regarding hospitalization.   I consulted the diabetic counselor and she was able to reach out to family.  She felt that they had a good grasp of her management and that she was appropriate for discharge.       Hayden Rasmussen, MD 07/19/22 1726

## 2022-07-19 NOTE — ED Notes (Signed)
Insulin rate maintained due to hospitalist order and transition to subq insulin at 0630

## 2022-07-20 LAB — URINE CULTURE

## 2022-08-04 DIAGNOSIS — R350 Frequency of micturition: Secondary | ICD-10-CM | POA: Diagnosis not present

## 2022-08-04 DIAGNOSIS — N3941 Urge incontinence: Secondary | ICD-10-CM | POA: Diagnosis not present

## 2022-08-04 DIAGNOSIS — R3121 Asymptomatic microscopic hematuria: Secondary | ICD-10-CM | POA: Diagnosis not present

## 2022-08-10 DIAGNOSIS — N1831 Chronic kidney disease, stage 3a: Secondary | ICD-10-CM | POA: Diagnosis not present

## 2022-08-10 DIAGNOSIS — E1122 Type 2 diabetes mellitus with diabetic chronic kidney disease: Secondary | ICD-10-CM | POA: Diagnosis not present

## 2022-08-10 DIAGNOSIS — I129 Hypertensive chronic kidney disease with stage 1 through stage 4 chronic kidney disease, or unspecified chronic kidney disease: Secondary | ICD-10-CM | POA: Diagnosis not present

## 2022-08-10 DIAGNOSIS — E039 Hypothyroidism, unspecified: Secondary | ICD-10-CM | POA: Diagnosis not present

## 2022-08-11 ENCOUNTER — Other Ambulatory Visit: Payer: Self-pay | Admitting: Family Medicine

## 2022-08-11 DIAGNOSIS — N644 Mastodynia: Secondary | ICD-10-CM

## 2022-08-12 ENCOUNTER — Encounter: Payer: Self-pay | Admitting: Internal Medicine

## 2022-08-12 DIAGNOSIS — N644 Mastodynia: Secondary | ICD-10-CM

## 2022-09-22 DIAGNOSIS — R3121 Asymptomatic microscopic hematuria: Secondary | ICD-10-CM | POA: Diagnosis not present

## 2022-09-22 DIAGNOSIS — N3941 Urge incontinence: Secondary | ICD-10-CM | POA: Diagnosis not present

## 2022-09-24 DIAGNOSIS — E890 Postprocedural hypothyroidism: Secondary | ICD-10-CM | POA: Diagnosis not present

## 2022-09-24 DIAGNOSIS — R809 Proteinuria, unspecified: Secondary | ICD-10-CM | POA: Diagnosis not present

## 2022-09-24 DIAGNOSIS — Z7985 Long-term (current) use of injectable non-insulin antidiabetic drugs: Secondary | ICD-10-CM | POA: Diagnosis not present

## 2022-09-24 DIAGNOSIS — Z794 Long term (current) use of insulin: Secondary | ICD-10-CM | POA: Diagnosis not present

## 2022-09-24 DIAGNOSIS — Z79890 Hormone replacement therapy (postmenopausal): Secondary | ICD-10-CM | POA: Diagnosis not present

## 2022-09-24 DIAGNOSIS — E1329 Other specified diabetes mellitus with other diabetic kidney complication: Secondary | ICD-10-CM | POA: Diagnosis not present

## 2022-09-24 DIAGNOSIS — E13649 Other specified diabetes mellitus with hypoglycemia without coma: Secondary | ICD-10-CM | POA: Diagnosis not present

## 2022-09-24 DIAGNOSIS — E1365 Other specified diabetes mellitus with hyperglycemia: Secondary | ICD-10-CM | POA: Diagnosis not present

## 2022-09-27 DIAGNOSIS — E1065 Type 1 diabetes mellitus with hyperglycemia: Secondary | ICD-10-CM | POA: Diagnosis not present

## 2022-09-29 ENCOUNTER — Other Ambulatory Visit: Payer: Self-pay | Admitting: Internal Medicine

## 2022-09-30 NOTE — Telephone Encounter (Signed)
Prescription refill request for Eliquis received.  Indication: afib  Last office visit: [place], 02/22/2022 Scr:1.03, 07/19/2022 Age: 87 yo  Weight: 59.9 kg   Refill sent.

## 2022-10-13 DIAGNOSIS — N3020 Other chronic cystitis without hematuria: Secondary | ICD-10-CM | POA: Diagnosis not present

## 2022-10-13 DIAGNOSIS — R3121 Asymptomatic microscopic hematuria: Secondary | ICD-10-CM | POA: Diagnosis not present

## 2022-10-14 ENCOUNTER — Other Ambulatory Visit: Payer: Self-pay | Admitting: Physician Assistant

## 2022-10-14 DIAGNOSIS — D509 Iron deficiency anemia, unspecified: Secondary | ICD-10-CM

## 2022-10-14 DIAGNOSIS — R600 Localized edema: Secondary | ICD-10-CM | POA: Diagnosis not present

## 2022-10-18 ENCOUNTER — Telehealth: Payer: Self-pay | Admitting: Internal Medicine

## 2022-10-18 ENCOUNTER — Other Ambulatory Visit: Payer: Self-pay | Admitting: Urology

## 2022-10-18 NOTE — Telephone Encounter (Signed)
Patient with diagnosis of afib on Eliquis for anticoagulation.    Procedure: bladder biopsy  Date of procedure: 11/19/22   CHA2DS2-VASc Score = 5   This indicates a 7.2% annual risk of stroke. The patient's score is based upon: CHF History: 0 HTN History: 1 Diabetes History: 1 Stroke History: 0 Vascular Disease History: 0 Age Score: 2 Gender Score: 1      CrCl 38 ml/min  Per office protocol, patient can hold Eliquis for 2-3 days prior to procedure.    **This guidance is not considered finalized until pre-operative APP has relayed final recommendations.**

## 2022-10-18 NOTE — Telephone Encounter (Signed)
   Name: [name]  DOB: [birth year]  MRN: [id number]  Primary Cardiologist: Pixie Casino, MD  Chart reviewed as part of pre-operative protocol coverage. Because of [name]'s past medical history and time since last visit, she will require a follow-up telephone visit in order to better assess preoperative cardiovascular risk.  Pre-op covering staff: - Please schedule appointment and call patient to inform them. If patient already had an upcoming appointment within acceptable timeframe, please add "pre-op clearance" to the appointment notes so provider is aware. - Please contact requesting surgeon's office via preferred method (i.e, phone, fax) to inform them of need for appointment prior to surgery.  Per office protocol, patient can hold Eliquis for 2-3 days prior to procedure.  Please restart medically safe to do so.  Elgie Collard, PA-C  10/18/2022, 3:50 PM

## 2022-10-18 NOTE — Telephone Encounter (Signed)
   Pre-operative Risk Assessment    Patient Name: [name]  DOB: 08-May-1935 MRN: [id number]      Request for Surgical Clearance    Procedure: bladder biopsy  Date of Surgery:  Clearance 11/19/22                                 Surgeon:  Dr. Link Snuffer Surgeon's Group or Practice Name:  [facility] Phone number:  [phone] Fax number:  [phone]   Type of Clearance Requested:   - Medical  - Pharmacy:  Hold Apixaban (Eliquis) 2 days prior   Type of Anesthesia:  General    Additional requests/questions:    Signed, Annabell Sabal   10/18/2022, 11:27 AM

## 2022-10-19 NOTE — Telephone Encounter (Signed)
Tried to call the pt to schedule tele visit for pre op clearance, though no answer or vm came on.

## 2022-10-20 ENCOUNTER — Telehealth: Payer: Self-pay | Admitting: *Deleted

## 2022-10-20 NOTE — Telephone Encounter (Signed)
  Patient Consent for Virtual Visit         [name] has provided verbal consent on 10/20/2022 for a virtual visit (video or telephone).   CONSENT FOR VIRTUAL VISIT FOR:  [name]  By participating in this virtual visit I agree to the following:  I hereby voluntarily request, consent and authorize [facility] and its employed or contracted physicians, physician assistants, nurse practitioners or other licensed health care professionals (the Practitioner), to provide me with telemedicine health care services (the "Services") as deemed necessary by the treating Practitioner. I acknowledge and consent to receive the Services by the Practitioner via telemedicine. I understand that the telemedicine visit will involve communicating with the Practitioner through live audiovisual communication technology and the disclosure of certain medical information by electronic transmission. I acknowledge that I have been given the opportunity to request an in-person assessment or other available alternative prior to the telemedicine visit and am voluntarily participating in the telemedicine visit.  I understand that I have the right to withhold or withdraw my consent to the use of telemedicine in the course of my care at any time, without affecting my right to future care or treatment, and that the Practitioner or I may terminate the telemedicine visit at any time. I understand that I have the right to inspect all information obtained and/or recorded in the course of the telemedicine visit and may receive copies of available information for a reasonable fee.  I understand that some of the potential risks of receiving the Services via telemedicine include:  Delay or interruption in medical evaluation due to technological equipment failure or disruption; Information transmitted may not be sufficient (e.g. poor resolution of images) to allow for appropriate medical decision making by the Practitioner;  and/or  In rare instances, security protocols could fail, causing a breach of personal health information.  Furthermore, I acknowledge that it is my responsibility to provide information about my medical history, conditions and care that is complete and accurate to the best of my ability. I acknowledge that Practitioner's advice, recommendations, and/or decision may be based on factors not within their control, such as incomplete or inaccurate data provided by me or distortions of diagnostic images or specimens that may result from electronic transmissions. I understand that the practice of medicine is not an exact science and that Practitioner makes no warranties or guarantees regarding treatment outcomes. I acknowledge that a copy of this consent can be made available to me via my patient portal ([facility]), or I can request a printed copy by calling the office of [facility].    I understand that my insurance will be billed for this visit.   I have read or had this consent read to me. I understand the contents of this consent, which adequately explains the benefits and risks of the Services being provided via telemedicine.  I have been provided ample opportunity to ask questions regarding this consent and the Services and have had my questions answered to my satisfaction. I give my informed consent for the services to be provided through the use of telemedicine in my medical care

## 2022-10-26 IMAGING — DX DG CHEST 2V
2 series · 2 of 2 positions shown · non-contrast
Comparison: 12/10/2021

CLINICAL DATA: Chest pain

EXAM:
CHEST - 2 VIEW

[chest pa]
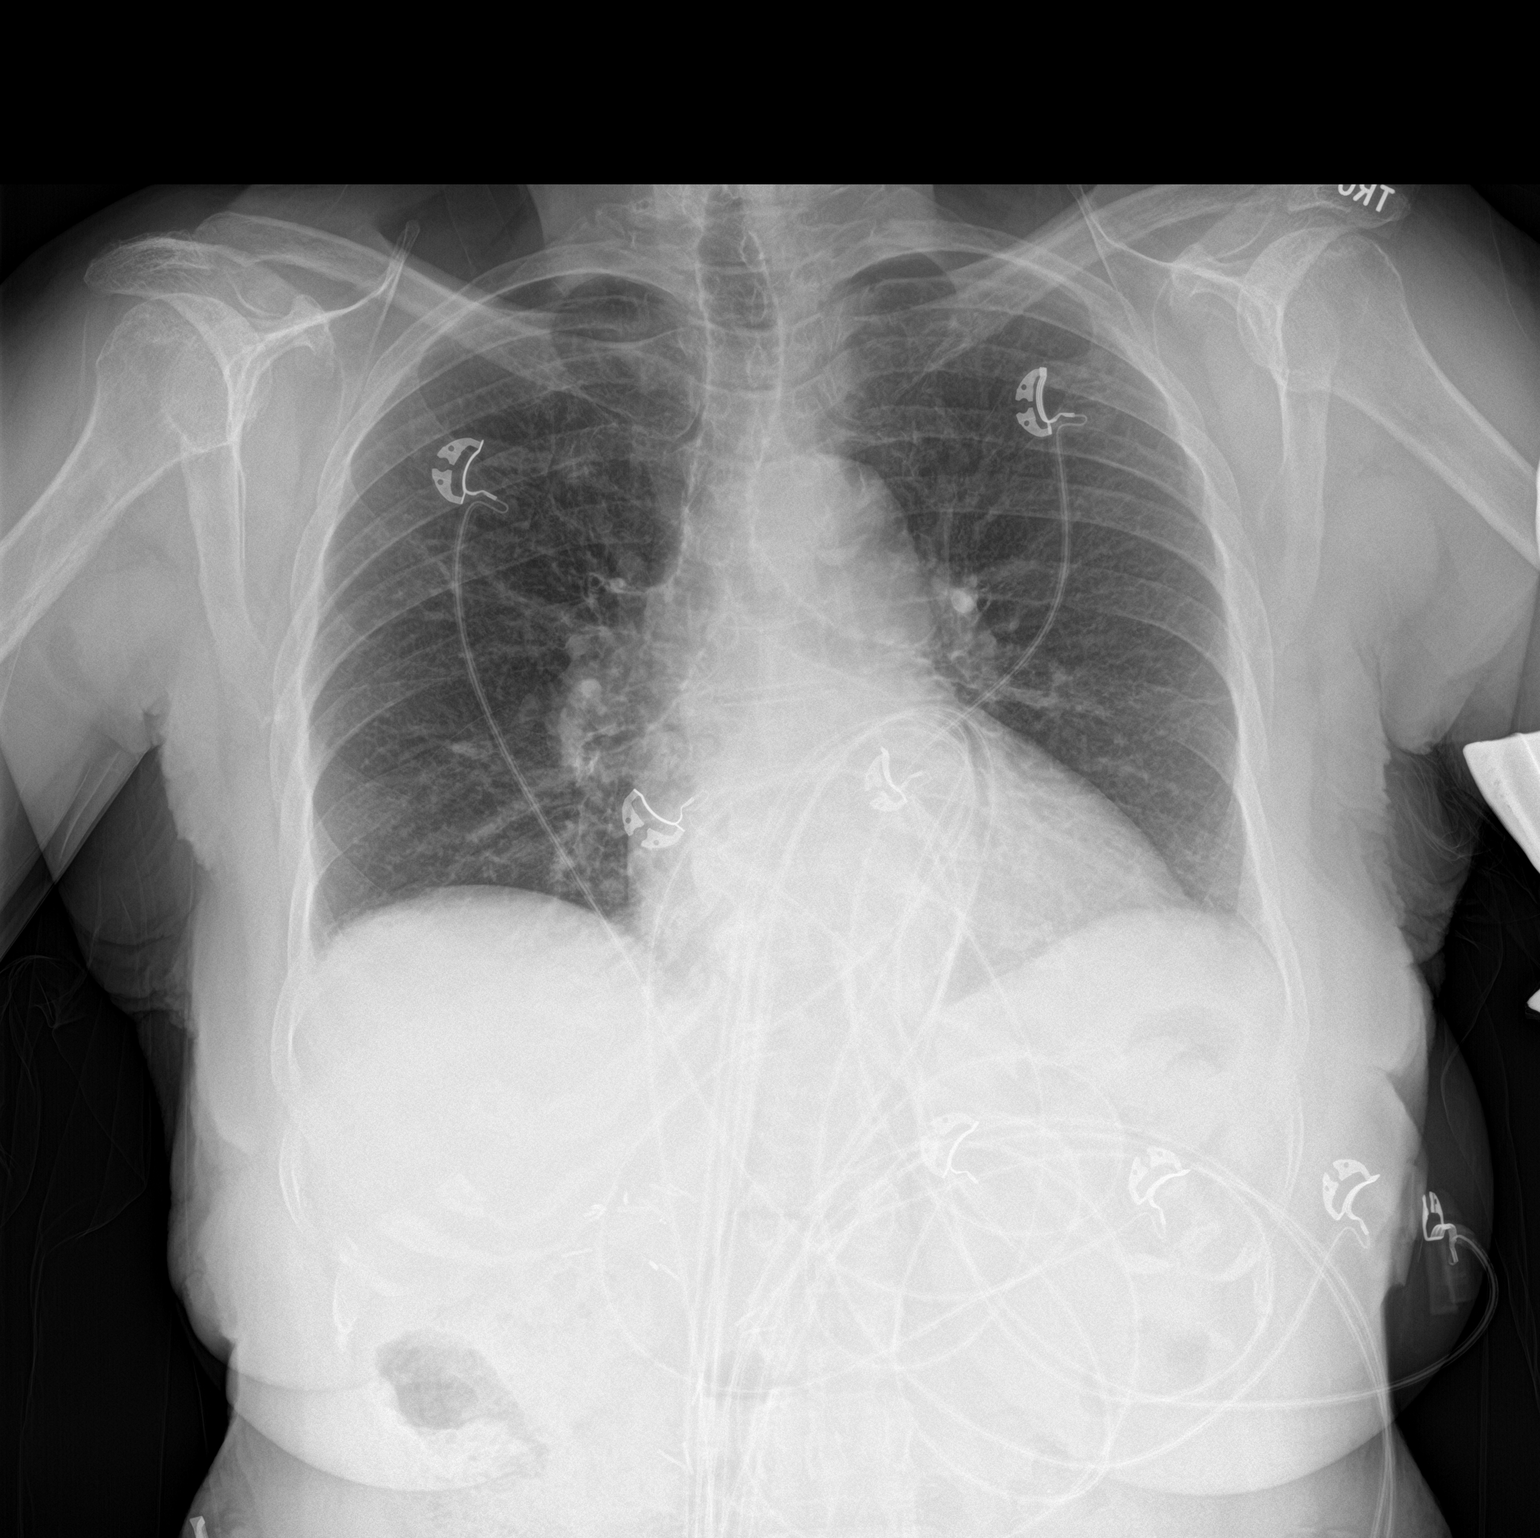

[chest lat]
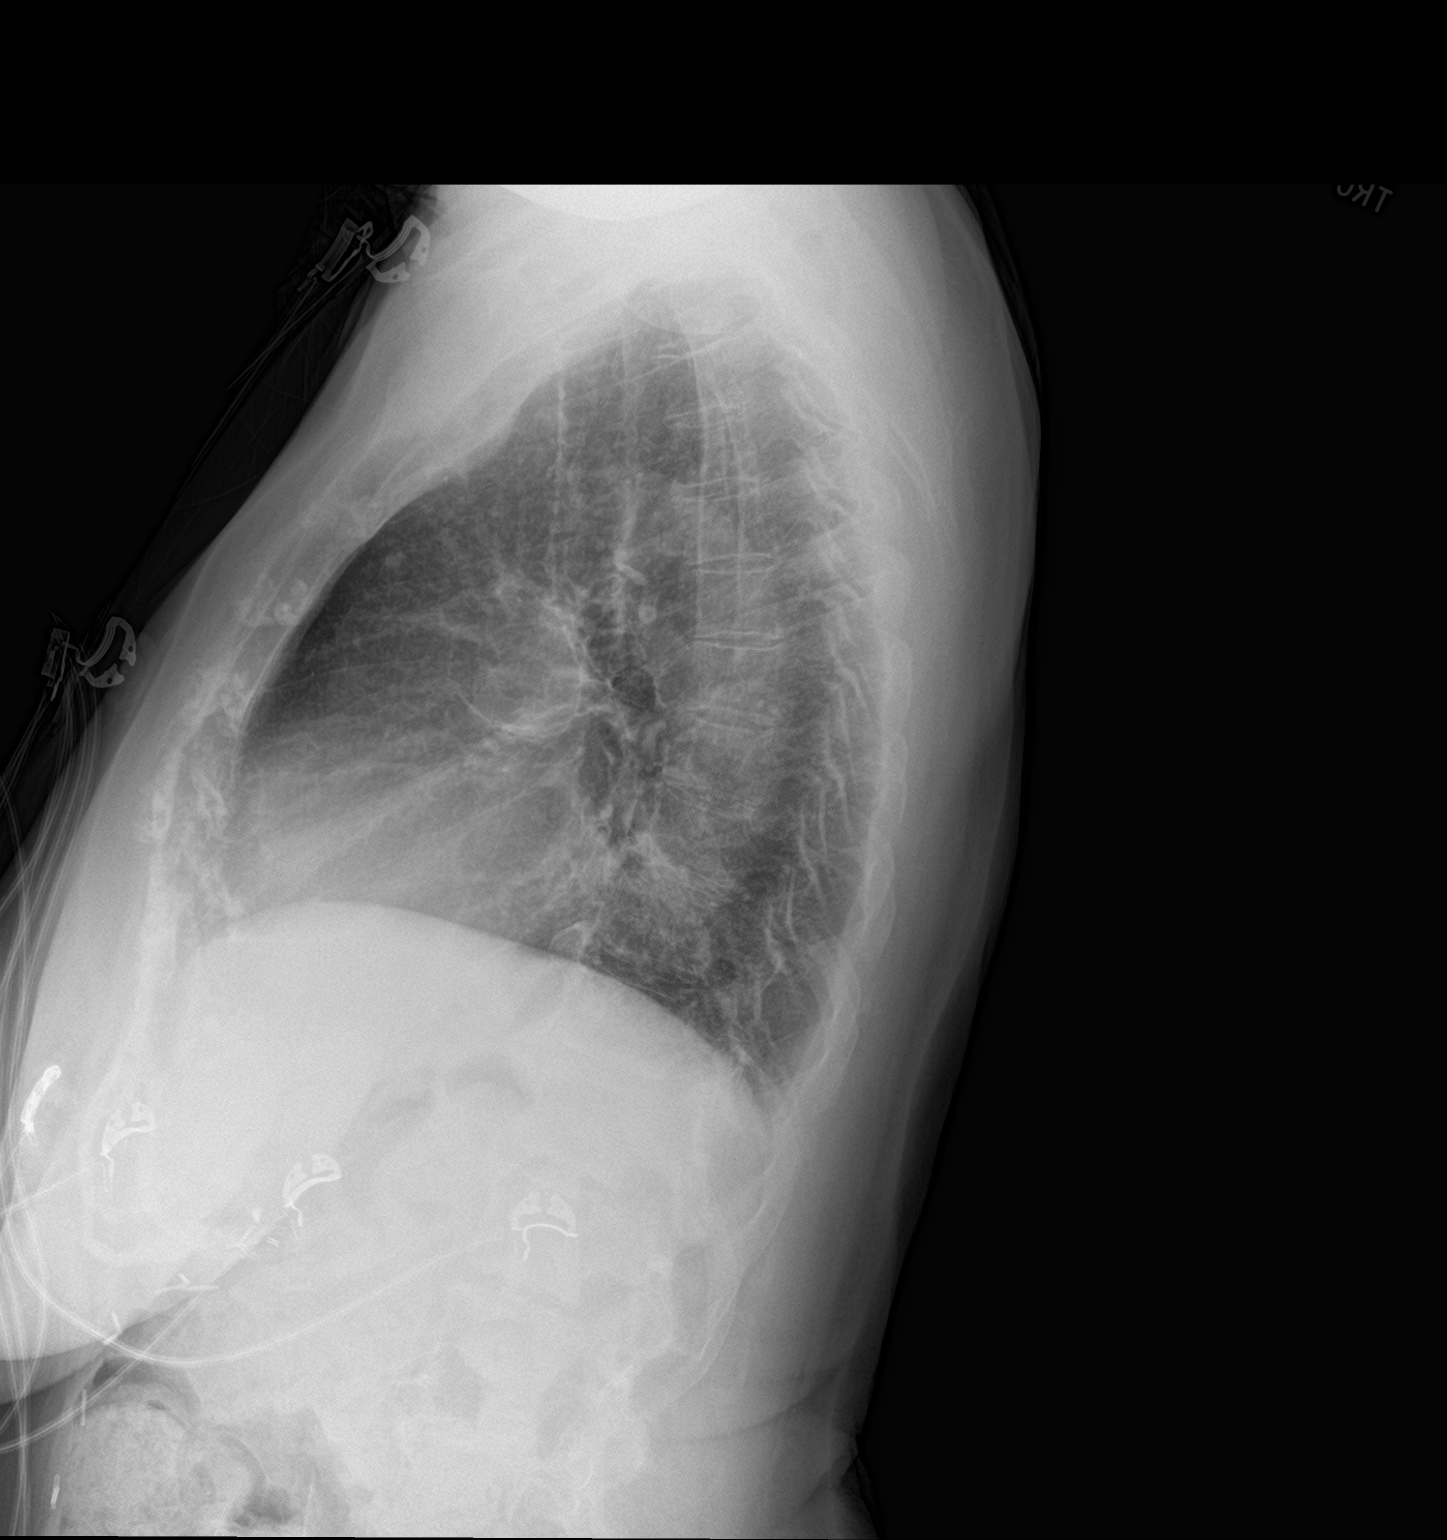

[2 of 2 positions shown; findings below may reference images not displayed]

FINDINGS: The heart size and mediastinal contours are within normal limits. No
focal airspace consolidation, pleural effusion, or pneumothorax.
IMPRESSION: No active cardiopulmonary disease.

## 2022-10-26 NOTE — Progress Notes (Signed)
Virtual Visit via Telephone Note   Because of [name]'s co-morbid illnesses, she is at least at moderate risk for complications without adequate follow up.  This format is felt to be most appropriate for this patient at this time.  The patient did not have access to video technology/had technical difficulties with video requiring transitioning to audio format only (telephone).  All issues noted in this document were discussed and addressed.  No physical exam could be performed with this format.  Please refer to the patient's chart for her consent to telehealth for [facility].  Evaluation Performed:  Preoperative cardiovascular risk assessment _____________   Date:  10/26/2022   Patient ID:  Karen, Nichols 1935/09/12, MRN [id number] Patient Location:  Home Provider location:   Office  Primary Care Provider:  Deland Pretty, MD Primary Cardiologist:  Pixie Casino, MD  Chief Complaint / Patient Profile   88 y.o. y/o female with a h/o paroxysmal AF (on Eliquis), DM type II, HTN, HLD who is pending bladder biopsy and presents today for telephonic preoperative cardiovascular risk assessment.  History of Present Illness    [name] is a 88 y.o. female who presents via audio/video conferencing for a telehealth visit today.  Pt was last seen in cardiology clinic on 02/22/2022 by Coletta Memos, NP.  At that time [name] was doing well with well-controlled BP. The patient is now pending procedure as outlined above. Since her last visit, she reports that she is doing well with well-controlled blood pressures at home.  She does report an occasional tinge of blood in her urine that is being monitored by [name] her PCP.  I advised her to contact our office if she notices increased blood in her urine or if she develops symptoms of anemia.  She denies chest pain, shortness of breath, lower extremity edema, fatigue, palpitations, melena, hematuria, hemoptysis, diaphoresis,  weakness, presyncope, syncope, orthopnea, and PND.   Per office protocol, patient can hold Eliquis for 2-3 days prior to procedure.  Please restart medically safe to do so.   Past Medical History    Past Medical History:  Diagnosis Date   Colon polyp    Diabetes mellitus    Hyperlipidemia    Hypertension    Poor appetite    Thyroid disease    Past Surgical History:  Procedure Laterality Date   BIOPSY  02/07/2022   Procedure: BIOPSY;  Surgeon: Ladene Artist, MD;  Location: [facility] ENDOSCOPY;  Service: Gastroenterology;;   BREAST EXCISIONAL BIOPSY Right    CARDIOVASCULAR STRESS TEST  07/15/2006   Normal scan, no ECG changes   COLONOSCOPY WITH PROPOFOL N/A 02/07/2022   Procedure: COLONOSCOPY WITH PROPOFOL;  Surgeon: Ladene Artist, MD;  Location: [place];  Service: Gastroenterology;  Laterality: N/A;   ESOPHAGOGASTRODUODENOSCOPY (EGD) WITH PROPOFOL N/A 02/07/2022   Procedure: ESOPHAGOGASTRODUODENOSCOPY (EGD) WITH PROPOFOL;  Surgeon: Ladene Artist, MD;  Location: [place];  Service: Gastroenterology;  Laterality: N/A;   TRANSTHORACIC ECHOCARDIOGRAM  01/08/2011   EF 60-65%, moderae mitral regurg, LA mild-moderately dilated,    Allergies  No Known Allergies  Home Medications    Prior to Admission medications   Medication Sig Start Date End Date Taking? Authorizing Provider  amiodarone (PACERONE) 200 MG tablet Take 1 tablet (200 mg total) by mouth daily. Direction change 05/04/22   Mercy Riding, MD  apixaban (ELIQUIS) 2.5 MG TABS tablet TAKE 1 TABLET (5 MG) TWICE DAILY 09/30/22   Hilty, Nadean Corwin, MD  Ascorbic Acid (VITAMIN C) 100 MG tablet Take 100 mg by mouth daily.    [provider]  FeFum-FePoly-FA-B Cmp-C-Biot (FOLIVANE-PLUS) CAPS TAKE 1 CAPSULE BY MOUTH EVERY DAY IN THE MORNING 10/14/22   Heilingoetter, Cassandra L, PA-C  insulin aspart (NOVOLOG FLEXPEN) 100 UNIT/ML FlexPen Inject 8 Units into the skin 3 (three) times daily with meals. 09/04/21   Mariel Aloe, MD  insulin glargine, 2 Unit Dial, (TOUJEO MAX SOLOSTAR) 300 UNIT/ML Solostar Pen Inject 14 Units into the skin at bedtime. 05/04/22   Mercy Riding, MD  KLOR-CON M20 20 MEQ tablet Take 20 mEq by mouth daily. 02/02/22   [provider]  levothyroxine (SYNTHROID) 88 MCG tablet Take 1 tablet (88 mcg total) by mouth daily before breakfast. 02/08/22   Bonnielee Haff, MD  pantoprazole (PROTONIX) 40 MG tablet Take 1 tablet (40 mg total) by mouth daily at 6 (six) AM. 02/09/22   Bonnielee Haff, MD  polyethylene glycol (MIRALAX / GLYCOLAX) 17 g packet Take 17 g by mouth daily. 02/09/22   Bonnielee Haff, MD  rosuvastatin (CRESTOR) 20 MG tablet Take 20 mg by mouth at bedtime. 10/14/22   [provider]  senna-docusate (SENOKOT-S) 8.6-50 MG tablet Take 2 tablets by mouth at bedtime. 02/08/22   Bonnielee Haff, MD  TIADYLT ER 180 MG 24 hr capsule TAKE 1 CAPSULE BY MOUTH EVERY DAY Patient taking differently: Take 180 mg by mouth daily. 11/27/21   Hilty, Nadean Corwin, MD  TRULICITY 7.26 OM/3.5DH SOPN Inject 1.5 mg into the skin once a week. sundys 05/01/22   [provider]    Physical Exam    Vital Signs:  [name] does not have vital signs available for review today.  Given telephonic nature of communication, physical exam is limited. AAOx3. NAD. Normal affect.  Speech and respirations are unlabored.  Accessory Clinical Findings    None  Assessment & Plan    1.  Preoperative Cardiovascular Risk Assessment: -   [name] perioperative risk of a major cardiac event is 6.6% according to the Revised Cardiac Risk Index (RCRI).  Therefore, she is at high risk for perioperative complications.   Her functional capacity is fair at 4.31 METs according to the Duke Activity Status Index (DASI). Recommendations: According to ACC/AHA guidelines, no further cardiovascular testing needed.  The patient may proceed to surgery at acceptable risk.   Antiplatelet and/or Anticoagulation  Recommendations:  Eliquis (Apixaban) can be held for 2-3 days prior to surgery.  Please resume post op when felt to be safe.      The patient was advised that if she develops new symptoms prior to surgery to contact our office to arrange for a follow-up visit, and she verbalized understanding.    A copy of this note will be routed to requesting surgeon.  Time:   Today, I have spent 8 minutes with the patient with telehealth technology discussing medical history, symptoms, and management plan.     Mable Fill, Marissa Nestle, NP  10/26/2022, 11:41 AM

## 2022-10-27 ENCOUNTER — Ambulatory Visit: Payer: Medicare PPO | Attending: Cardiovascular Disease | Admitting: Nurse Practitioner

## 2022-10-27 DIAGNOSIS — Z01810 Encounter for preprocedural cardiovascular examination: Secondary | ICD-10-CM | POA: Diagnosis not present

## 2022-10-29 ENCOUNTER — Other Ambulatory Visit: Payer: Self-pay | Admitting: Internal Medicine

## 2022-11-03 DIAGNOSIS — I10 Essential (primary) hypertension: Secondary | ICD-10-CM | POA: Diagnosis not present

## 2022-11-03 DIAGNOSIS — E139 Other specified diabetes mellitus without complications: Secondary | ICD-10-CM | POA: Diagnosis not present

## 2022-11-03 DIAGNOSIS — N1831 Chronic kidney disease, stage 3a: Secondary | ICD-10-CM | POA: Diagnosis not present

## 2022-11-03 DIAGNOSIS — I480 Paroxysmal atrial fibrillation: Secondary | ICD-10-CM | POA: Diagnosis not present

## 2022-11-03 DIAGNOSIS — R600 Localized edema: Secondary | ICD-10-CM | POA: Diagnosis not present

## 2022-11-03 DIAGNOSIS — D649 Anemia, unspecified: Secondary | ICD-10-CM | POA: Diagnosis not present

## 2022-11-09 NOTE — Progress Notes (Signed)
COVID Vaccine Completed:  Date of COVID positive in last 90 days:  PCP -  Deland Pretty, MD Cardiologist - Lyman Bishop, MD  Cardiac clearance by Ambrose Pancoast 10/27/22 in [place]   Chest x-ray - 03/25/22 Epic EKG - 05/03/22 Epic Stress Test - 2007 ECHO - 09/01/21 Epic Cardiac Cath -  Pacemaker/ICD device last checked: Spinal Cord Stimulator:  Bowel Prep -   Sleep Study -  CPAP -   Fasting Blood Sugar -  Checks Blood Sugar _____ times a day  Last dose of GLP1 agonist-  N/A GLP1 instructions:  N/A   Last dose of SGLT-2 inhibitors-  N/A SGLT-2 instructions: N/A   Blood Thinner Instructions Eliquis, hold 2-3 days  Aspirin Instructions: Last Dose:  Activity level:  Can go up a flight of stairs and perform activities of daily living without stopping and without symptoms of chest pain or shortness of breath.  Able to exercise without symptoms  Unable to go up a flight of stairs without symptoms of     Anesthesia review: HTN, a fib, DM1, DKA 07/18/22 hospitalization, CKD  Patient denies shortness of breath, fever, cough and chest pain at PAT appointment  Patient verbalized understanding of instructions that were given to them at the PAT appointment. Patient was also instructed that they will need to review over the PAT instructions again at home before surgery.

## 2022-11-09 NOTE — Patient Instructions (Signed)
SURGICAL WAITING ROOM VISITATION  Patients having surgery or a procedure may have no more than 2 support people in the waiting area - these visitors may rotate.    Children under the age of 1 must have an adult with them who is not the patient.  Due to an increase in RSV and influenza rates and associated hospitalizations, children ages 67 and under may not visit patients in [place].  If the patient needs to stay at the hospital during part of their recovery, the visitor guidelines for inpatient rooms apply. Pre-op nurse will coordinate an appropriate time for 1 support person to accompany patient in pre-op.  This support person may not rotate.    Please refer to the [facility] website for the visitor guidelines for Inpatients (after your surgery is over and you are in a regular room).     Your procedure is scheduled on: 11/19/22   Report to [facility] Main Entrance    Report to admitting at 11:15 AM   Call this number if you have problems the morning of surgery [phone]   Do not eat food or drink liquids :After Midnight.          If you have questions, please contact your surgeon's office.   FOLLOW BOWEL PREP AND ANY ADDITIONAL PRE OP INSTRUCTIONS YOU RECEIVED FROM YOUR SURGEON'S OFFICE!!!     Oral Hygiene is also important to reduce your risk of infection.                                    Remember - BRUSH YOUR TEETH THE MORNING OF SURGERY WITH YOUR REGULAR TOOTHPASTE  DENTURES WILL BE REMOVED PRIOR TO SURGERY PLEASE DO NOT APPLY "Poly grip" OR ADHESIVES!!!   Do NOT smoke after Midnight   Take these medicines the morning of surgery with A SIP OF WATER: Amiodarone, Levothyroxine, Pantoprazole, Tiadylt,   DO NOT TAKE ANY ORAL DIABETIC MEDICATIONS DAY OF YOUR SURGERY  How to Manage Your Diabetes Before and After Surgery  Why is it important to control my blood sugar before and after surgery? Improving blood sugar levels before and after surgery  helps healing and can limit problems. A way of improving blood sugar control is eating a healthy diet by:  Eating less sugar and carbohydrates  Increasing activity/exercise  Talking with your doctor about reaching your blood sugar goals High blood sugars (greater than 180 mg/dL) can raise your risk of infections and slow your recovery, so you will need to focus on controlling your diabetes during the weeks before surgery. Make sure that the doctor who takes care of your diabetes knows about your planned surgery including the date and location.  How do I manage my blood sugar before surgery? Check your blood sugar at least 4 times a day, starting 2 days before surgery, to make sure that the level is not too high or low. Check your blood sugar the morning of your surgery when you wake up and every 2 hours until you get to the Short Stay unit. If your blood sugar is less than 70 mg/dL, you will need to treat for low blood sugar: Do not take insulin. Treat a low blood sugar (less than 70 mg/dL) with  cup of clear juice (cranberry or apple), 4 glucose tablets, OR glucose gel. Recheck blood sugar in 15 minutes after treatment (to make sure it is greater than 70  mg/dL). If your blood sugar is not greater than 70 mg/dL on recheck, call [phone] for further instructions. Report your blood sugar to the short stay nurse when you get to Short Stay.  If you are admitted to the hospital after surgery: Your blood sugar will be checked by the staff and you will probably be given insulin after surgery (instead of oral diabetes medicines) to make sure you have good blood sugar levels. The goal for blood sugar control after surgery is 80-180 mg/dL.   WHAT DO I DO ABOUT MY DIABETES MEDICATION?  Do not take oral diabetes medicines (pills) the morning of surgery.  THE DAY BEFORE SURGERY, take Novolog before meals as prescribed. Take 80% of Toujeo night time dose.      THE MORNING OF SURGERY, Do not take  Novolog unless blood sugar is greater than 220, then take 50% of dose  DO NOT TAKE THE FOLLOWING 7 DAYS PRIOR TO SURGERY: Ozempic, Wegovy, Rybelsus (Semaglutide), Byetta (exenatide), Bydureon (exenatide ER), Victoza, Saxenda (liraglutide), or Trulicity (dulaglutide) Mounjaro (Tirzepatide) Adlyxin (Lixisenatide), Polyethylene Glycol Loxenatide.  If your CBG is greater than 220 mg/dL, you may take  of your sliding scale  (correction) dose of insulin.    For patients with insulin pumps: Contact your diabetes doctor for specific instructions before surgery. Decrease basal rates by 20% at midnight the night before your surgery. Note that if your surgery is planned to be longer than 2 hours, your insulin pump will be removed and intravenous (IV) insulin will be started and managed by the nurses and the anesthesiologist. You will be able to restart your insulin pump once you are awake and able to manage it.  Make sure to bring insulin pump supplies to the hospital with you in case the  site needs to be changed.  Reviewed and Endorsed by [facility] Patient Education Committee, August 2015  Bring CPAP mask and tubing day of surgery.                              You may not have any metal on your body including hair pins, jewelry, and body piercing             Do not wear make-up, lotions, powders, perfumes, or deodorant  Do not wear nail polish including gel and S&S, artificial/acrylic nails, or any other type of covering on natural nails including finger and toenails. If you have artificial nails, gel coating, etc. that needs to be removed by a nail salon please have this removed prior to surgery or surgery may need to be canceled/ delayed if the surgeon/ anesthesia feels like they are unable to be safely monitored.   Do not shave  48 hours prior to surgery.    Do not bring valuables to the hospital. [place].   Contacts, glasses, dentures or  bridgework may not be worn into surgery.  DO NOT [place]. PHARMACY WILL DISPENSE MEDICATIONS LISTED ON YOUR MEDICATION LIST TO YOU DURING YOUR ADMISSION [place]!    Patients discharged on the day of surgery will not be allowed to drive home.  Someone NEEDS to stay with you for the first 24 hours after anesthesia.   Special Instructions: Bring a copy of your healthcare power of attorney and living will documents the day  of surgery if you haven't scanned them before.              Please read over the following fact sheets you were given: IF [place] [phone] Schneiders   If you received a COVID test during your pre-op visit  it is requested that you wear a mask when out in public, stay away from anyone that may not be feeling well and notify your surgeon if you develop symptoms. If you test positive for Covid or have been in contact with anyone that has tested positive in the last 10 days please notify you surgeon.    [place] - Preparing for Surgery Before surgery, you can play an important role.  Because skin is not sterile, your skin needs to be as free of germs as possible.  You can reduce the number of germs on your skin by washing with CHG (chlorahexidine gluconate) soap before surgery.  CHG is an antiseptic cleaner which kills germs and bonds with the skin to continue killing germs even after washing. Please DO NOT use if you have an allergy to CHG or antibacterial soaps.  If your skin becomes reddened/irritated stop using the CHG and inform your nurse when you arrive at Short Stay. Do not shave (including legs and underarms) for at least 48 hours prior to the first CHG shower.  You may shave your face/neck.  Please follow these instructions carefully:  1.  Shower with CHG Soap the night before surgery and the  morning of surgery.  2.  If you choose to wash your hair, wash your hair first as  usual with your normal  shampoo.  3.  After you shampoo, rinse your hair and body thoroughly to remove the shampoo.                             4.  Use CHG as you would any other liquid soap.  You can apply chg directly to the skin and wash.  Gently with a scrungie or clean washcloth.  5.  Apply the CHG Soap to your body ONLY FROM THE NECK DOWN.   Do   not use on face/ open                           Wound or open sores. Avoid contact with eyes, ears mouth and   genitals (private parts).                       Wash face,  Genitals (private parts) with your normal soap.             6.  Wash thoroughly, paying special attention to the area where your    surgery  will be performed.  7.  Thoroughly rinse your body with warm water from the neck down.  8.  DO NOT shower/wash with your normal soap after using and rinsing off the CHG Soap.                9.  Pat yourself dry with a clean towel.            10.  Wear clean pajamas.            11.  Place clean sheets on your bed the night of your first shower and do not  sleep with pets. Day  of Surgery : Do not apply any lotions/deodorants the morning of surgery.  Please wear clean clothes to the hospital/surgery center.  FAILURE TO FOLLOW THESE INSTRUCTIONS MAY RESULT IN THE CANCELLATION OF YOUR SURGERY  PATIENT SIGNATURE_________________________________  NURSE SIGNATURE__________________________________  ________________________________________________________________________

## 2022-11-10 ENCOUNTER — Encounter (HOSPITAL_COMMUNITY)
Admission: RE | Admit: 2022-11-10 | Discharge: 2022-11-10 | Disposition: A | Discharge status: Home / Self Care | Payer: Medicare PPO | Source: Ambulatory Visit | Attending: Anesthesiology | Admitting: Anesthesiology

## 2022-11-10 DIAGNOSIS — I480 Paroxysmal atrial fibrillation: Secondary | ICD-10-CM

## 2022-11-10 DIAGNOSIS — E1165 Type 2 diabetes mellitus with hyperglycemia: Secondary | ICD-10-CM

## 2022-11-10 NOTE — Progress Notes (Signed)
COVID Vaccine Completed:  Date of COVID positive in last 90 days:  PCP -  Deland Pretty, MD Cardiologist - Lyman Bishop, MD  Cardiac clearance by Ambrose Pancoast 10/27/22 in [place]   Chest x-ray - 03/25/22 Epic EKG - 05/03/22 Epic Stress Test - 2007 ECHO - 09/01/21 Epic Cardiac Cath -  Pacemaker/ICD device last checked: Spinal Cord Stimulator:  Bowel Prep -   Sleep Study -  CPAP -   Fasting Blood Sugar -  Checks Blood Sugar _____ times a day  Last dose of GLP1 agonist-  N/A GLP1 instructions:  N/A   Last dose of SGLT-2 inhibitors-  N/A SGLT-2 instructions: N/A   Blood Thinner Instructions Eliquis, hold 2-3 days  Aspirin Instructions: Last Dose:  Activity level:  Can go up a flight of stairs and perform activities of daily living without stopping and without symptoms of chest pain or shortness of breath.  Able to exercise without symptoms  Unable to go up a flight of stairs without symptoms of     Anesthesia review: HTN, a fib, DM1, DKA 07/18/22 hospitalization, CKD  Patient denies shortness of breath, fever, cough and chest pain at PAT appointment  Patient verbalized understanding of instructions that were given to them at the PAT appointment. Patient was also instructed that they will need to review over the PAT instructions again at home before surgery.

## 2022-11-16 ENCOUNTER — Encounter (HOSPITAL_COMMUNITY)
Admission: RE | Admit: 2022-11-16 | Discharge: 2022-11-16 | Disposition: A | Discharge status: Home / Self Care | Payer: Medicare PPO | Source: Ambulatory Visit | Attending: Urology | Admitting: Urology

## 2022-11-16 ENCOUNTER — Encounter (HOSPITAL_COMMUNITY): Payer: Self-pay

## 2022-11-16 ENCOUNTER — Other Ambulatory Visit: Payer: Self-pay

## 2022-11-16 DIAGNOSIS — Z87891 Personal history of nicotine dependence: Secondary | ICD-10-CM | POA: Insufficient documentation

## 2022-11-16 DIAGNOSIS — Z9049 Acquired absence of other specified parts of digestive tract: Secondary | ICD-10-CM | POA: Diagnosis not present

## 2022-11-16 DIAGNOSIS — Z794 Long term (current) use of insulin: Secondary | ICD-10-CM | POA: Insufficient documentation

## 2022-11-16 DIAGNOSIS — Z8719 Personal history of other diseases of the digestive system: Secondary | ICD-10-CM | POA: Insufficient documentation

## 2022-11-16 DIAGNOSIS — Z90710 Acquired absence of both cervix and uterus: Secondary | ICD-10-CM | POA: Diagnosis not present

## 2022-11-16 DIAGNOSIS — Z79899 Other long term (current) drug therapy: Secondary | ICD-10-CM | POA: Insufficient documentation

## 2022-11-16 DIAGNOSIS — E785 Hyperlipidemia, unspecified: Secondary | ICD-10-CM | POA: Diagnosis not present

## 2022-11-16 DIAGNOSIS — Z01812 Encounter for preprocedural laboratory examination: Secondary | ICD-10-CM | POA: Diagnosis not present

## 2022-11-16 DIAGNOSIS — E039 Hypothyroidism, unspecified: Secondary | ICD-10-CM | POA: Insufficient documentation

## 2022-11-16 DIAGNOSIS — I10 Essential (primary) hypertension: Secondary | ICD-10-CM | POA: Diagnosis not present

## 2022-11-16 DIAGNOSIS — E119 Type 2 diabetes mellitus without complications: Secondary | ICD-10-CM | POA: Insufficient documentation

## 2022-11-16 DIAGNOSIS — N329 Bladder disorder, unspecified: Secondary | ICD-10-CM | POA: Diagnosis not present

## 2022-11-16 DIAGNOSIS — Z7901 Long term (current) use of anticoagulants: Secondary | ICD-10-CM | POA: Diagnosis not present

## 2022-11-16 DIAGNOSIS — I480 Paroxysmal atrial fibrillation: Secondary | ICD-10-CM | POA: Insufficient documentation

## 2022-11-16 DIAGNOSIS — E1165 Type 2 diabetes mellitus with hyperglycemia: Secondary | ICD-10-CM

## 2022-11-16 HISTORY — DX: Pneumonia, unspecified organism: J18.9

## 2022-11-16 HISTORY — DX: Anemia, unspecified: D64.9

## 2022-11-16 HISTORY — DX: Hypothyroidism, unspecified: E03.9

## 2022-11-16 LAB — HEMOGLOBIN A1C
Hgb A1c MFr Bld: 10.5 % — ABNORMAL HIGH (ref 4.8–5.6)
Mean Plasma Glucose: 254.65 mg/dL

## 2022-11-16 LAB — CBC
HCT: 38.8 % (ref 36.0–46.0)
Hemoglobin: 12.2 g/dL (ref 12.0–15.0)
MCH: 27.7 pg (ref 26.0–34.0)
MCHC: 31.4 g/dL (ref 30.0–36.0)
MCV: 88 fL (ref 80.0–100.0)
Platelets: 240 10*3/uL (ref 150–400)
RBC: 4.41 MIL/uL (ref 3.87–5.11)
RDW: 14.3 % (ref 11.5–15.5)
WBC: 7.2 10*3/uL (ref 4.0–10.5)
nRBC: 0 % (ref 0.0–0.2)

## 2022-11-16 LAB — BASIC METABOLIC PANEL
Anion gap: 9 (ref 5–15)
BUN: 21 mg/dL (ref 8–23)
CO2: 23 mmol/L (ref 22–32)
Calcium: 8.6 mg/dL — ABNORMAL LOW (ref 8.9–10.3)
Chloride: 102 mmol/L (ref 98–111)
Creatinine, Ser: 1.15 mg/dL — ABNORMAL HIGH (ref 0.44–1.00)
GFR, Estimated: 47 mL/min — ABNORMAL LOW (ref >60)
Glucose, Bld: 182 mg/dL — ABNORMAL HIGH (ref 70–99)
Potassium: 4.4 mmol/L (ref 3.5–5.1)
Sodium: 134 mmol/L — ABNORMAL LOW (ref 135–145)

## 2022-11-16 LAB — GLUCOSE, CAPILLARY: Glucose-Capillary: 172 mg/dL — ABNORMAL HIGH (ref 70–99)

## 2022-11-16 NOTE — Progress Notes (Signed)
Pt. Able to complete ADL's without SOB or CP

## 2022-11-16 NOTE — Patient Instructions (Addendum)
SURGICAL WAITING ROOM VISITATION  Patients having surgery or a procedure may have no more than 2 support people in the waiting area - these visitors may rotate.    Children under the age of 90 must have an adult with them who is not the patient.  Due to an increase in RSV and influenza rates and associated hospitalizations, children ages 48 and under may not visit patients in [place].  If the patient needs to stay at the hospital during part of their recovery, the visitor guidelines for inpatient rooms apply. Pre-op nurse will coordinate an appropriate time for 1 support person to accompany patient in pre-op.  This support person may not rotate.    Please refer to the [facility] website for the visitor guidelines for Inpatients (after your surgery is over and you are in a regular room).     Your procedure is scheduled on: 11/19/22   Report to [facility] Main Entrance    Report to admitting at 11:15 AM   Call this number if you have problems the morning of surgery [phone]   Do not eat food or drink liquids :After Midnight.          If you have questions, please contact your surgeon's office.   FOLLOW BOWEL PREP AND ANY ADDITIONAL PRE OP INSTRUCTIONS YOU RECEIVED FROM YOUR SURGEON'S OFFICE!!!     Oral Hygiene is also important to reduce your risk of infection.                                    Remember - BRUSH YOUR TEETH THE MORNING OF SURGERY WITH YOUR REGULAR TOOTHPASTE  DENTURES WILL BE REMOVED PRIOR TO SURGERY PLEASE DO NOT APPLY "Poly grip" OR ADHESIVES!!!   Do NOT smoke after Midnight   Take these medicines the morning of surgery with A SIP OF WATER: Amiodarone, Levothyroxine, Pantoprazole, Tiadylt,   DO NOT TAKE ANY ORAL DIABETIC MEDICATIONS DAY OF YOUR SURGERY  How to Manage Your Diabetes Before and After Surgery  Why is it important to control my blood sugar before and after surgery? Improving blood sugar levels before and after surgery  helps healing and can limit problems. A way of improving blood sugar control is eating a healthy diet by:  Eating less sugar and carbohydrates  Increasing activity/exercise  Talking with your doctor about reaching your blood sugar goals High blood sugars (greater than 180 mg/dL) can raise your risk of infections and slow your recovery, so you will need to focus on controlling your diabetes during the weeks before surgery. Make sure that the doctor who takes care of your diabetes knows about your planned surgery including the date and location.  How do I manage my blood sugar before surgery? Check your blood sugar at least 4 times a day, starting 2 days before surgery, to make sure that the level is not too high or low. Check your blood sugar the morning of your surgery when you wake up and every 2 hours until you get to the Short Stay unit. If your blood sugar is less than 70 mg/dL, you will need to treat for low blood sugar: Do not take insulin. Treat a low blood sugar (less than 70 mg/dL) with  cup of clear juice (cranberry or apple), 4 glucose tablets, OR glucose gel. Recheck blood sugar in 15 minutes after treatment (to make sure it is greater than 70  mg/dL). If your blood sugar is not greater than 70 mg/dL on recheck, call [phone] for further instructions. Report your blood sugar to the short stay nurse when you get to Short Stay.  If you are admitted to the hospital after surgery: Your blood sugar will be checked by the staff and you will probably be given insulin after surgery (instead of oral diabetes medicines) to make sure you have good blood sugar levels. The goal for blood sugar control after surgery is 80-180 mg/dL.   WHAT DO I DO ABOUT MY DIABETES MEDICATION?  Do not take oral diabetes medicines (pills) the morning of surgery.  THE DAY BEFORE SURGERY, take Novolog before meals as prescribed. Take 80% of Toujeo night time dose.      THE MORNING OF SURGERY, Do not take  Novolog unless blood sugar is greater than 220, then take 50% of dose  DO NOT TAKE THE FOLLOWING 7 DAYS PRIOR TO SURGERY: Ozempic, Wegovy, Rybelsus (Semaglutide), Byetta (exenatide), Bydureon (exenatide ER), Victoza, Saxenda (liraglutide), or Trulicity (dulaglutide) Mounjaro (Tirzepatide) Adlyxin (Lixisenatide), Polyethylene Glycol Loxenatide.  If your CBG is greater than 220 mg/dL, you may take  of your sliding scale  (correction) dose of insulin.    For patients with insulin pumps: Contact your diabetes doctor for specific instructions before surgery. Decrease basal rates by 20% at midnight the night before your surgery. Note that if your surgery is planned to be longer than 2 hours, your insulin pump will be removed and intravenous (IV) insulin will be started and managed by the nurses and the anesthesiologist. You will be able to restart your insulin pump once you are awake and able to manage it.  Make sure to bring insulin pump supplies to the hospital with you in case the  site needs to be changed.  Reviewed and Endorsed by [facility] Patient Education Committee, August 2015  Bring CPAP mask and tubing day of surgery.                              You may not have any metal on your body including hair pins, jewelry, and body piercing             Do not wear make-up, lotions, powders, perfumes, or deodorant  Do not wear nail polish including gel and S&S, artificial/acrylic nails, or any other type of covering on natural nails including finger and toenails. If you have artificial nails, gel coating, etc. that needs to be removed by a nail salon please have this removed prior to surgery or surgery may need to be canceled/ delayed if the surgeon/ anesthesia feels like they are unable to be safely monitored.   Do not shave  48 hours prior to surgery.    Do not bring valuables to the hospital. [place].   Contacts, glasses, dentures or  bridgework may not be worn into surgery.  DO NOT [place]. PHARMACY WILL DISPENSE MEDICATIONS LISTED ON YOUR MEDICATION LIST TO YOU DURING YOUR ADMISSION [place]!    Patients discharged on the day of surgery will not be allowed to drive home.  Someone NEEDS to stay with you for the first 24 hours after anesthesia.   Special Instructions: Bring a copy of your healthcare power of attorney and living will documents the day  of surgery if you haven't scanned them before.              Please read over the following fact sheets you were given: IF [place] [phone] Schneiders   If you received a COVID test during your pre-op visit  it is requested that you wear a mask when out in public, stay away from anyone that may not be feeling well and notify your surgeon if you develop symptoms. If you test positive for Covid or have been in contact with anyone that has tested positive in the last 10 days please notify you surgeon.    [place] - Preparing for Surgery Before surgery, you can play an important role.  Because skin is not sterile, your skin needs to be as free of germs as possible.  You can reduce the number of germs on your skin by washing with CHG (chlorahexidine gluconate) soap before surgery.  CHG is an antiseptic cleaner which kills germs and bonds with the skin to continue killing germs even after washing. Please DO NOT use if you have an allergy to CHG or antibacterial soaps.  If your skin becomes reddened/irritated stop using the CHG and inform your nurse when you arrive at Short Stay. Do not shave (including legs and underarms) for at least 48 hours prior to the first CHG shower.  You may shave your face/neck.  Please follow these instructions carefully:  1.  Shower with CHG Soap the night before surgery and the  morning of surgery.  2.  If you choose to wash your hair, wash your hair first as  usual with your normal  shampoo.  3.  After you shampoo, rinse your hair and body thoroughly to remove the shampoo.                             4.  Use CHG as you would any other liquid soap.  You can apply chg directly to the skin and wash.  Gently with a scrungie or clean washcloth.  5.  Apply the CHG Soap to your body ONLY FROM THE NECK DOWN.   Do   not use on face/ open                           Wound or open sores. Avoid contact with eyes, ears mouth and   genitals (private parts).                       Wash face,  Genitals (private parts) with your normal soap.             6.  Wash thoroughly, paying special attention to the area where your    surgery  will be performed.  7.  Thoroughly rinse your body with warm water from the neck down.  8.  DO NOT shower/wash with your normal soap after using and rinsing off the CHG Soap.                9.  Pat yourself dry with a clean towel.            10.  Wear clean pajamas.            11.  Place clean sheets on your bed the night of your first shower and do not  sleep with pets. Day  of Surgery : Do not apply any lotions/deodorants the morning of surgery.  Please wear clean clothes to the hospital/surgery center.  FAILURE TO FOLLOW THESE INSTRUCTIONS MAY RESULT IN THE CANCELLATION OF YOUR SURGERY  PATIENT SIGNATURE_________________________________  NURSE SIGNATURE__________________________________  ________________________________________________________________________

## 2022-11-17 NOTE — Progress Notes (Signed)
Anesthesia Chart Review   Case: [id number] Date/Time: 11/19/22 1315   Procedure: CYSTOSCOPY WITH BLADDER BIOPSY VERSUS TRANSURETHRAL RESECTION OF BLADDER TUMOR BILATERAL RETROGRADE PYELOGRAM (Bilateral) - 1 HR FOR CASE   Anesthesia type: General   Pre-op diagnosis: BLADDER LESION   Location: [facility] ROOM 05 / [facility]   Surgeons: Lucas Mallow, MD       DISCUSSION:88 y.o. former smoker with h/o HTN, DM II, PAF on Eliquis, bladder lesion scheduled for above procedure 11/19/2022 with Dr. Link Snuffer.   Pt seen by cardiology 10/27/2022. Per OV note, "[name]'s perioperative risk of a major cardiac event is 6.6% according to the Revised Cardiac Risk Index (RCRI).  Therefore, she is at high risk for perioperative complications.   Her functional capacity is fair at 4.31 METs according to the Duke Activity Status Index (DASI). Recommendations: According to ACC/AHA guidelines, no further cardiovascular testing needed.  The patient may proceed to surgery at acceptable risk.   Antiplatelet and/or Anticoagulation Recommendations:   Eliquis (Apixaban) can be held for 2-3 days prior to surgery.  Please resume post op when felt to be safe."  Elevated A1C, forwarded to PCP.  Evaluate glucose DOS.  VS: BP (!) 155/61   Pulse 60   Temp 36.8 C (Oral)   Resp 16   Ht '5\' 2"'$  (1.575 m)   Wt 63.5 kg   SpO2 100%   BMI 25.61 kg/m   PROVIDERS: Deland Pretty, MD is PCP   Cardiologist - Lyman Bishop, MD  LABS:  A1C forwarded to PCP (all labs ordered are listed, but only abnormal results are displayed)  Labs Reviewed  GLUCOSE, CAPILLARY - Abnormal; Notable for the following components:      Result Value   Glucose-Capillary 172 (*)    All other components within normal limits  HEMOGLOBIN A1C - Abnormal; Notable for the following components:   Hgb A1c MFr Bld 10.5 (*)    All other components within normal limits  BASIC METABOLIC PANEL - Abnormal; Notable for the following components:   Sodium 134 (*)     Glucose, Bld 182 (*)    Creatinine, Ser 1.15 (*)    Calcium 8.6 (*)    GFR, Estimated 47 (*)    All other components within normal limits  CBC     IMAGES:   EKG:   CV: Echo 09/01/21 1. Left ventricular ejection fraction, by estimation, is 65 to 70%. The  left ventricle has normal function. The left ventricle has no regional  wall motion abnormalities. There is mild left ventricular hypertrophy.  Left ventricular diastolic parameters  are indeterminate.   2. Right ventricular systolic function is mildly reduced. The right  ventricular size is normal. There is normal pulmonary artery systolic  pressure. The estimated right ventricular systolic pressure is 48.1 mmHg.   3. Left atrial size was mildly dilated. The atrial septum bows right  suggesting LA pressure overload.   4. The mitral valve is normal in structure. Trivial mitral valve  regurgitation. No evidence of mitral stenosis.   5. The aortic valve is tricuspid. Aortic valve regurgitation is not  visualized. Aortic valve sclerosis/calcification is present, without any  evidence of aortic stenosis.   6. The inferior vena cava is normal in size with greater than 50%  respiratory variability, suggesting right atrial pressure of 3 mmHg.   7. The patient was in atrial fibrillation.  Past Medical History:  Diagnosis Date   Anemia    Colon polyp  Diabetes mellitus    type 1   Hyperlipidemia    Hypertension    Hypothyroidism    Pneumonia    Poor appetite    Thyroid disease     Past Surgical History:  Procedure Laterality Date   ABDOMINAL HYSTERECTOMY     APPENDECTOMY     BIOPSY  02/07/2022   Procedure: BIOPSY;  Surgeon: Ladene Artist, MD;  Location: [facility];  Service: Gastroenterology;;   BREAST EXCISIONAL BIOPSY Right    CARDIOVASCULAR STRESS TEST  07/15/2006   Normal scan, no ECG changes   COLONOSCOPY WITH PROPOFOL N/A 02/07/2022   Procedure: COLONOSCOPY WITH PROPOFOL;  Surgeon: Ladene Artist,  MD;  Location: [facility];  Service: Gastroenterology;  Laterality: N/A;   ESOPHAGOGASTRODUODENOSCOPY (EGD) WITH PROPOFOL N/A 02/07/2022   Procedure: ESOPHAGOGASTRODUODENOSCOPY (EGD) WITH PROPOFOL;  Surgeon: Ladene Artist, MD;  Location: [place];  Service: Gastroenterology;  Laterality: N/A;   TRANSTHORACIC ECHOCARDIOGRAM  01/08/2011   EF 60-65%, moderae mitral regurg, LA mild-moderately dilated,    MEDICATIONS:  amiodarone (PACERONE) 200 MG tablet   apixaban (ELIQUIS) 2.5 MG TABS tablet   Ascorbic Acid (VITAMIN C) 100 MG tablet   FeFum-FePoly-FA-B Cmp-C-Biot (FOLIVANE-PLUS) CAPS   furosemide (LASIX) 20 MG tablet   insulin aspart (NOVOLOG FLEXPEN) 100 UNIT/ML FlexPen   insulin glargine, 2 Unit Dial, (TOUJEO MAX SOLOSTAR) 300 UNIT/ML Solostar Pen   levothyroxine (SYNTHROID) 88 MCG tablet   pantoprazole (PROTONIX) 40 MG tablet   polyethylene glycol (MIRALAX / GLYCOLAX) 17 g packet   rosuvastatin (CRESTOR) 20 MG tablet   senna-docusate (SENOKOT-S) 8.6-50 MG tablet   TIADYLT ER 180 MG 24 hr capsule   trimethoprim (TRIMPEX) 100 MG tablet   No current facility-administered medications for this encounter.    Karen Felix Ward, PA-C [facility]-Surgical Testing [phone]

## 2022-11-17 NOTE — Anesthesia Preprocedure Evaluation (Addendum)
Anesthesia Evaluation  Patient identified by MRN, date of birth, ID band Patient awake    Reviewed: Allergy & Precautions, H&P , NPO status , Patient's Chart, lab work & pertinent test results  Airway Mallampati: II   Neck ROM: full    Dental   Pulmonary former smoker   breath sounds clear to auscultation       Cardiovascular hypertension,  Rhythm:regular Rate:Normal     Neuro/Psych    GI/Hepatic PUD,,,  Endo/Other  diabetes, Type 2, Insulin DependentHypothyroidism    Renal/GU      Musculoskeletal   Abdominal   Peds  Hematology   Anesthesia Other Findings   Reproductive/Obstetrics                             Anesthesia Physical Anesthesia Plan  ASA: 3  Anesthesia Plan: General   Post-op Pain Management:    Induction: Intravenous  PONV Risk Score and Plan: 3 and Ondansetron, Dexamethasone and Treatment may vary due to age or medical condition  Airway Management Planned: LMA  Additional Equipment:   Intra-op Plan:   Post-operative Plan: Extubation in OR  Informed Consent: I have reviewed the patients History and Physical, chart, labs and discussed the procedure including the risks, benefits and alternatives for the proposed anesthesia with the patient or authorized representative who has indicated his/her understanding and acceptance.     Dental advisory given  Plan Discussed with: CRNA, Anesthesiologist and Surgeon  Anesthesia Plan Comments: (See PAT note 11/16/2022)       Anesthesia Quick Evaluation

## 2022-11-18 ENCOUNTER — Encounter (HOSPITAL_COMMUNITY): Payer: Self-pay | Admitting: Urology

## 2022-11-19 ENCOUNTER — Encounter (HOSPITAL_COMMUNITY): Payer: Self-pay | Admitting: Urology

## 2022-11-19 ENCOUNTER — Ambulatory Visit (HOSPITAL_COMMUNITY): Payer: Medicare PPO | Admitting: Physician Assistant

## 2022-11-19 ENCOUNTER — Ambulatory Visit (HOSPITAL_COMMUNITY): Payer: Medicare PPO

## 2022-11-19 ENCOUNTER — Ambulatory Visit (HOSPITAL_BASED_OUTPATIENT_CLINIC_OR_DEPARTMENT_OTHER): Payer: Medicare PPO | Admitting: Anesthesiology

## 2022-11-19 ENCOUNTER — Ambulatory Visit (HOSPITAL_COMMUNITY)
Admission: RE | Admit: 2022-11-19 | Discharge: 2022-11-19 | Disposition: A | Discharge status: Home / Self Care | Payer: Medicare PPO | Source: Ambulatory Visit | Attending: Urology | Admitting: Urology

## 2022-11-19 ENCOUNTER — Encounter (HOSPITAL_COMMUNITY)
Admission: RE | Disposition: A | Discharge status: Home / Self Care | Payer: Self-pay | Source: Ambulatory Visit | Attending: Urology

## 2022-11-19 DIAGNOSIS — Z87891 Personal history of nicotine dependence: Secondary | ICD-10-CM | POA: Insufficient documentation

## 2022-11-19 DIAGNOSIS — D090 Carcinoma in situ of bladder: Secondary | ICD-10-CM | POA: Diagnosis not present

## 2022-11-19 DIAGNOSIS — N3020 Other chronic cystitis without hematuria: Secondary | ICD-10-CM | POA: Diagnosis not present

## 2022-11-19 DIAGNOSIS — C672 Malignant neoplasm of lateral wall of bladder: Secondary | ICD-10-CM | POA: Diagnosis not present

## 2022-11-19 DIAGNOSIS — E039 Hypothyroidism, unspecified: Secondary | ICD-10-CM

## 2022-11-19 DIAGNOSIS — I10 Essential (primary) hypertension: Secondary | ICD-10-CM | POA: Insufficient documentation

## 2022-11-19 DIAGNOSIS — N329 Bladder disorder, unspecified: Secondary | ICD-10-CM

## 2022-11-19 DIAGNOSIS — N3592 Unspecified urethral stricture, female: Secondary | ICD-10-CM | POA: Diagnosis not present

## 2022-11-19 DIAGNOSIS — E119 Type 2 diabetes mellitus without complications: Secondary | ICD-10-CM

## 2022-11-19 DIAGNOSIS — E1165 Type 2 diabetes mellitus with hyperglycemia: Secondary | ICD-10-CM

## 2022-11-19 DIAGNOSIS — Z794 Long term (current) use of insulin: Secondary | ICD-10-CM

## 2022-11-19 DIAGNOSIS — Z7901 Long term (current) use of anticoagulants: Secondary | ICD-10-CM | POA: Diagnosis not present

## 2022-11-19 DIAGNOSIS — N3281 Overactive bladder: Secondary | ICD-10-CM | POA: Diagnosis not present

## 2022-11-19 DIAGNOSIS — D494 Neoplasm of unspecified behavior of bladder: Secondary | ICD-10-CM | POA: Diagnosis not present

## 2022-11-19 DIAGNOSIS — N1330 Unspecified hydronephrosis: Secondary | ICD-10-CM | POA: Diagnosis not present

## 2022-11-19 HISTORY — PX: CYSTOSCOPY WITH BIOPSY: SHX5122

## 2022-11-19 LAB — GLUCOSE, CAPILLARY
Glucose-Capillary: 106 mg/dL — ABNORMAL HIGH (ref 70–99)
Glucose-Capillary: 97 mg/dL (ref 70–99)

## 2022-11-19 IMAGING — DX DG CHEST 1V PORT
1 series · 1 of 1 positions shown · non-contrast
Comparison: January 01, 2022.

CLINICAL DATA: Hyperglycemia.

EXAM:
PORTABLE CHEST 1 VIEW

[chest]
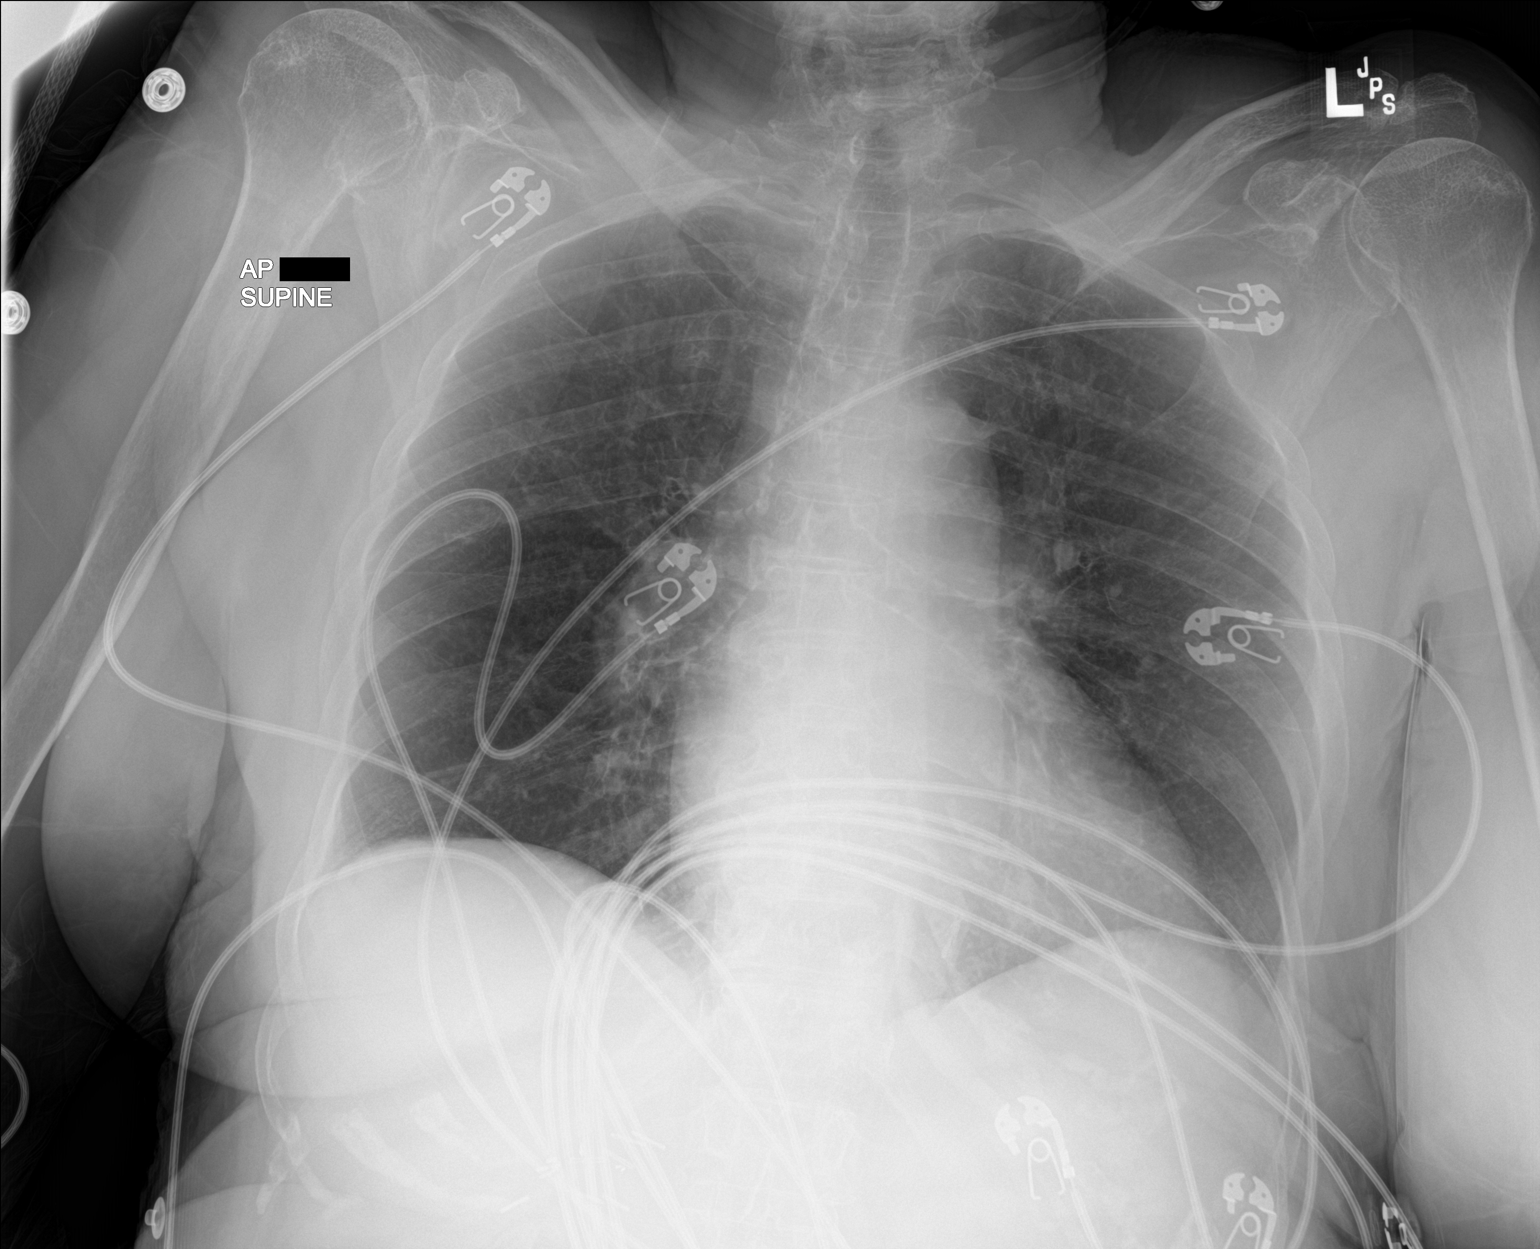

[1 of 1 positions shown; findings below may reference images not displayed]

FINDINGS: The heart size and mediastinal contours are within normal limits.
Both lungs are clear. The visualized skeletal structures are
unremarkable.
IMPRESSION: No active disease.

## 2022-11-19 SURGERY — CYSTOSCOPY, WITH BIOPSY
Anesthesia: General | Laterality: Bilateral

## 2022-11-19 MED ORDER — DEXAMETHASONE SODIUM PHOSPHATE 10 MG/ML IJ SOLN
INTRAMUSCULAR | Status: DC | PRN
  Administered 2022-11-19: 10 mg via INTRAVENOUS

## 2022-11-19 MED ORDER — ROCURONIUM BROMIDE 10 MG/ML (PF) SYRINGE
PREFILLED_SYRINGE | INTRAVENOUS | Status: AC
  Filled 2022-11-19: qty 10

## 2022-11-19 MED ORDER — HYDROCODONE-ACETAMINOPHEN 5-325 MG PO TABS: 1.0000 | ORAL_TABLET | ORAL | 0 refills | Status: DC | PRN

## 2022-11-19 MED ORDER — FENTANYL CITRATE (PF) 100 MCG/2ML IJ SOLN
INTRAMUSCULAR | Status: DC | PRN
  Administered 2022-11-19: 50 ug via INTRAVENOUS
  Administered 2022-11-19 (×6): 25 ug via INTRAVENOUS

## 2022-11-19 MED ORDER — DEXAMETHASONE SODIUM PHOSPHATE 10 MG/ML IJ SOLN
INTRAMUSCULAR | Status: AC
  Filled 2022-11-19: qty 1

## 2022-11-19 MED ORDER — ACETAMINOPHEN 10 MG/ML IV SOLN
1000.0000 mg | Freq: Once | INTRAVENOUS | Status: DC | PRN
  Administered 2022-11-19: 1000 mg via INTRAVENOUS

## 2022-11-19 MED ORDER — FENTANYL CITRATE (PF) 100 MCG/2ML IJ SOLN
INTRAMUSCULAR | Status: AC
  Filled 2022-11-19: qty 2

## 2022-11-19 MED ORDER — LIDOCAINE HCL (PF) 2 % IJ SOLN
INTRAMUSCULAR | Status: AC
  Filled 2022-11-19: qty 5

## 2022-11-19 MED ORDER — FENTANYL CITRATE PF 50 MCG/ML IJ SOSY
25.0000 ug | PREFILLED_SYRINGE | INTRAMUSCULAR | Status: DC | PRN
  Administered 2022-11-19: 25 ug via INTRAVENOUS

## 2022-11-19 MED ORDER — FENTANYL CITRATE PF 50 MCG/ML IJ SOSY
PREFILLED_SYRINGE | INTRAMUSCULAR | Status: AC
  Administered 2022-11-19: 25 ug via INTRAVENOUS
  Filled 2022-11-19: qty 1

## 2022-11-19 MED ORDER — ORAL CARE MOUTH RINSE
15.0000 mL | Freq: Once | OROMUCOSAL | Status: AC
  Administered 2022-11-19: 15 mL via OROMUCOSAL

## 2022-11-19 MED ORDER — ACETAMINOPHEN 10 MG/ML IV SOLN
INTRAVENOUS | Status: AC
  Filled 2022-11-19: qty 100

## 2022-11-19 MED ORDER — ONDANSETRON HCL 4 MG/2ML IJ SOLN
INTRAMUSCULAR | Status: AC
  Filled 2022-11-19: qty 2

## 2022-11-19 MED ORDER — LACTATED RINGERS IV SOLN: INTRAVENOUS | Status: DC

## 2022-11-19 MED ORDER — LIDOCAINE 2% (20 MG/ML) 5 ML SYRINGE
INTRAMUSCULAR | Status: DC | PRN
  Administered 2022-11-19: 60 mg via INTRAVENOUS

## 2022-11-19 MED ORDER — ONDANSETRON HCL 4 MG/2ML IJ SOLN: 4.0000 mg | Freq: Four times a day (QID) | INTRAMUSCULAR | Status: DC | PRN

## 2022-11-19 MED ORDER — PROPOFOL 10 MG/ML IV BOLUS
INTRAVENOUS | Status: DC | PRN
  Administered 2022-11-19: 100 mg via INTRAVENOUS

## 2022-11-19 MED ORDER — IOHEXOL 300 MG/ML  SOLN
INTRAMUSCULAR | Status: DC | PRN
  Administered 2022-11-19: 20 mL

## 2022-11-19 MED ORDER — CEFAZOLIN SODIUM-DEXTROSE 2-4 GM/100ML-% IV SOLN
2.0000 g | INTRAVENOUS | Status: AC
  Administered 2022-11-19: 2 g via INTRAVENOUS
  Filled 2022-11-19: qty 100

## 2022-11-19 MED ORDER — OXYCODONE HCL 5 MG/5ML PO SOLN: 5.0000 mg | Freq: Once | ORAL | Status: DC | PRN

## 2022-11-19 MED ORDER — ONDANSETRON HCL 4 MG/2ML IJ SOLN
INTRAMUSCULAR | Status: DC | PRN
  Administered 2022-11-19: 4 mg via INTRAVENOUS

## 2022-11-19 MED ORDER — SODIUM CHLORIDE 0.9 % IR SOLN
Status: DC | PRN
  Administered 2022-11-19: 3000 mL
  Administered 2022-11-19: 9000 mL

## 2022-11-19 MED ORDER — STERILE WATER FOR IRRIGATION IR SOLN
Status: DC | PRN
  Administered 2022-11-19: 3000 mL

## 2022-11-19 MED ORDER — OXYCODONE HCL 5 MG PO TABS: 5.0000 mg | ORAL_TABLET | Freq: Once | ORAL | Status: DC | PRN

## 2022-11-19 MED ORDER — CHLORHEXIDINE GLUCONATE 0.12 % MT SOLN: 15.0000 mL | Freq: Once | OROMUCOSAL | Status: AC

## 2022-11-19 SURGICAL SUPPLY — 21 items
BAG DRN RND TRDRP ANRFLXCHMBR (UROLOGICAL SUPPLIES)
BAG URINE DRAIN 2000ML AR STRL (UROLOGICAL SUPPLIES) IMPLANT
BAG URO CATCHER STRL LF (MISCELLANEOUS) ×2 IMPLANT
CATH FOLEY 2WAY SLVR  5CC 18FR (CATHETERS)
CATH FOLEY 2WAY SLVR 5CC 18FR (CATHETERS) IMPLANT
DRAPE FOOT SWITCH (DRAPES) ×2 IMPLANT
DRSG TELFA 3X8 NADH STRL (GAUZE/BANDAGES/DRESSINGS) IMPLANT
ELECT REM PT RETURN 15FT ADLT (MISCELLANEOUS) ×2 IMPLANT
GLOVE BIO SURGEON STRL SZ7.5 (GLOVE) ×2 IMPLANT
GOWN STRL REUS W/ TWL XL LVL3 (GOWN DISPOSABLE) ×2 IMPLANT
GOWN STRL REUS W/TWL XL LVL3 (GOWN DISPOSABLE) ×1
GUIDEWIRE STR DUAL SENSOR (WIRE) IMPLANT
KIT TURNOVER KIT A (KITS) IMPLANT
LOOP CUT BIPOLAR 24F LRG (ELECTROSURGICAL) IMPLANT
MANIFOLD NEPTUNE II (INSTRUMENTS) ×2 IMPLANT
PACK CYSTO (CUSTOM PROCEDURE TRAY) ×2 IMPLANT
PLUG CATH AND CAP STER (CATHETERS) IMPLANT
SYR TOOMEY IRRIG 70ML (MISCELLANEOUS) ×1
SYRINGE TOOMEY IRRIG 70ML (MISCELLANEOUS) IMPLANT
TUBING CONNECTING 10 (TUBING) ×2 IMPLANT
TUBING UROLOGY SET (TUBING) ×2 IMPLANT

## 2022-11-19 NOTE — Transfer of Care (Signed)
Immediate Anesthesia Transfer of Care Note  Patient: [name]  Procedure(s) Performed: CYSTOSCOPY WITH TRANSURETHRAL RESECTION OF BLADDER TUMOR BILATERAL RETROGRADE PYELOGRAM (Bilateral)  Patient Location: PACU  Anesthesia Type:General  Level of Consciousness: awake, alert , and oriented  Airway & Oxygen Therapy: Patient Spontanous Breathing and Patient connected to face mask oxygen  Post-op Assessment: Report given to RN and Post -op Vital signs reviewed and stable  Post vital signs: Reviewed and stable  Last Vitals:  Vitals Value Taken Time  BP 139/81   Temp    Pulse 78   Resp 20   SpO2 100%     Last Pain:  Vitals:   11/19/22 1146  TempSrc:   PainSc: 0-No pain         Complications: No notable events documented.

## 2022-11-19 NOTE — Op Note (Signed)
Operative Note  Preoperative diagnosis:  1.  Bladder tumor  Postoperative diagnosis: 1.  Bladder tumor--large 2.  Mild meatal stenosis  Procedure(s): 1.  Cystoscopy with bilateral retrograde pyelogram 2.  Urethral dilation 3.  Transurethral resection of bladder tumor--large  Surgeon: Link Snuffer, MD  Assistants: None  Anesthesia: General  Complications: None immediate  EBL: Minimal  Specimens: 1.  Bladder tumor  Drains/Catheters: 1.  None  Intraoperative findings: 1.  Normal urethra 2.  Bilateral retrograde pyelogram without any obvious filling defect or hydronephrosis.  Bladder mucosa with diffuse erythema with slightly raised papillary mucosa concerning for CIS.  There was a large amount of bladder involvement.  Majority of involvement was on the left lateral wall extending towards the posterior and this measured about 6 cm.  There was some raised erythema on the right lateral wall measuring about 3 cm.  There was another 3 cm area on the anterior bladder wall.  All of these areas were resected and/or fulgurated.  All of the abnormal areas appeared superficial rather than invasive.  Bilateral ureteral orifices were in orthotopic position and away from the area of resection.  Indication: 88 year old female with a history of chronic cystitis found to have persistent bladder erythema concerning for CIS presents for previously mentioned operation.  Description of procedure:  The patient was identified and consent was obtained.  The patient was taken to the operating room and placed in the supine position.  The patient was placed under general anesthesia.  Perioperative antibiotics were administered.  The patient was placed in dorsal lithotomy.  Patient was prepped and draped in a standard sterile fashion and a timeout was performed.  A 21 French rigid cystoscope was advanced into the urethra and into the bladder.  Complete cystoscopy was performed with findings noted above.  The  left ureteral orifice was cannulated with an open-ended ureteral catheter and a retrograde pyelogram was performed with no obvious abnormal findings.  Same was performed on the right again with no obvious abnormal findings.  I withdrew the scope and the urethral meatus was too tight for resectoscope.  Therefore, I sequentially dilated from 20 [place] up to 72 [place] with sounds.  I was then able to pass a 14 [place] resectoscope with a visual obturator and placed into the urethra and into the bladder.  I exchanged for the bipolar working element.  I resected multiple areas of slightly raised potentially papillary mucosa concerning for CIS.  There was a large amount of bladder involvement.  A lot of this tissue was resected and fulgurated.  Surrounding areas were fulgurated.  A large amount of bladder that was fulgurated.  All abnormal areas of edematous raised mucosa was treated.  I collected the specimen and reinspected bladder mucosa.  There was no evidence of any perforation.  No active bleeding.  Bilateral ureteral orifices were away from the area of resection.  I drained the bladder and withdrew the scope.  Patient tolerated the procedure well stable postoperative.  Plan: Follow-up in 1 week for pathology review

## 2022-11-19 NOTE — Anesthesia Procedure Notes (Signed)
Procedure Name: LMA Insertion Date/Time: 11/19/2022 1:24 PM  Performed by: Genelle Bal, CRNAPre-anesthesia Checklist: Patient identified, Emergency Drugs available, Suction available and Patient being monitored Patient Re-evaluated:Patient Re-evaluated prior to induction Oxygen Delivery Method: Circle system utilized Preoxygenation: Pre-oxygenation with 100% oxygen Induction Type: IV induction Ventilation: Mask ventilation without difficulty LMA: LMA inserted LMA Size: 4.0 Number of attempts: 1 Airway Equipment and Method: Bite block Placement Confirmation: positive ETCO2 Tube secured with: Tape Dental Injury: Teeth and Oropharynx as per pre-operative assessment

## 2022-11-19 NOTE — Discharge Instructions (Addendum)
Transurethral Resection of Bladder Tumor (TURBT) or Bladder Biopsy   You may use Azo as needed for bladder discomfort.  This can be purchased at any pharmacy.  Definition:  Transurethral Resection of the Bladder Tumor is a surgical procedure used to diagnose and remove tumors within the bladder. TURBT is the most common treatment for early stage bladder cancer.  General instructions:     Your recent bladder surgery requires very little post hospital care but some definite precautions.  Despite the fact that no skin incisions were used, the area around the bladder incisions are raw and covered with scabs to promote healing and prevent bleeding. Certain precautions are needed to insure that the scabs are not disturbed over the next 2-4 weeks while the healing proceeds.  Because the raw surface inside your bladder and the irritating effects of urine you may expect frequency of urination and/or urgency (a stronger desire to urinate) and perhaps even getting up at night more often. This will usually resolve or improve slowly over the healing period. You may see some blood in your urine over the first 6 weeks. Do not be alarmed, even if the urine was clear for a while. Get off your feet and drink lots of fluids until clearing occurs. If you start to pass clots or don't improve call us.  Diet:  You may return to your normal diet immediately. Because of the raw surface of your bladder, alcohol, spicy foods, foods high in acid and drinks with caffeine may cause irritation or frequency and should be used in moderation. To keep your urine flowing freely and avoid constipation, drink plenty of fluids during the day (8-10 glasses). Tip: Avoid cranberry juice because it is very acidic.  Activity:  Your physical activity doesn't need to be restricted. However, if you are very active, you may see some blood in the urine. We suggest that you reduce your activity under the circumstances until the bleeding has  stopped.  Bowels:  It is important to keep your bowels regular during the postoperative period. Straining with bowel movements can cause bleeding. A bowel movement every other day is reasonable. Use a mild laxative if needed, such as milk of magnesia 2-3 tablespoons, or 2 Dulcolax tablets. Call if you continue to have problems. If you had been taking narcotics for pain, before, during or after your surgery, you may be constipated. Take a laxative if necessary.    Medication:  You should resume your pre-surgery medications unless told not to. In addition you may be given an antibiotic to prevent or treat infection. Antibiotics are not always necessary. All medication should be taken as prescribed until the bottles are finished unless you are having an unusual reaction to one of the drugs.

## 2022-11-19 NOTE — H&P (Signed)
CC/HPI: I was consulted to assist the patient's microscopic hematuria. She smoked 30 years ago. She is on Eliquis but no aspirin   She has urge incontinence and sometimes wears 2 or less pads a day. No bedwetting or stress incontinence. She voids every 2 hours gets up 5 or 6 times a night with a good flow   She is an insulin-dependent diabetic. She has had a hysterectomy.   Patient has microscopic hematuria. She has urge incontinence frequency and significant nocturia. return with CT scan and for cystoscopy deferred for insurance reasons. Call if urine culture positive. I spent many minutes repeating the work-up of microscopic hematuria and explaining the differential diagnosis. The conversation was quite circular. I am not certain how else I could explain things better. She also would like to have her frequency day and night and urgency helped and we will discuss this next time. She understands that her urinalysis today was negative but if if its been positive in the past that should not be ignored especially in someone with a smoking history   Patient complains of a little bit of burning since some sort of biopsy many months ago in the hospital. The last hospital note was for poorly controlled diabetes. She had a positive urine culture in April 2023. CT scan demonstrated pancreatic abnormality discussed with patient. The CT scan will be sent to primary care and flag. The CT scan also demonstrated cystitis.   Urine positive   On cystoscopy patient had diffuse erythema. It was in patches. There is a few white flecks. Urine was not that cloudy. Urine was sent for culture. Pictures were taken and kept   The patient likely has chronic cystitis based upon cystoscopic findings, CT scan and at least one positive culture in the last several months. She does have a smoking history. I called in ciprofloxacin 250 mg twice a day for 7 days. I will then start her on trimethoprim 100 mg 30x11. I will repeat her  cystoscopy in 6 weeks. If she still has erythema she understands that she would need a biopsy from one of my partners and she is aware of this.. I am hoping that once a day antibiotic will help down regulate her overactive bladder. Call if culture differs   Based upon the mucosal enhancement and CT findings and degree of erythema today the cystitis may still persist at 6 weeks. Having said that I will recommend biopsy if it is still present to rule out carcinoma in situ   Send note with CT scan report to primary care about pancreas   Patient has not been taking her trimethoprim. She states she was having less burning on it but having a little bit of burning today at the end of urination only. I did not want to get a false positive. I sent the urine for culture and put her back on trimethoprim. Repeat cystoscopy in about 6 weeks. Call if culture is positive   I have a picture of the last cystoscopic findings. Last culture was negative   Co-pay for visit will be returned. She states she was going less frequently day and night on the trimethoprim.   Today  Frequency stable. Last culture negative.  Patient says she has little bit of burning at the end of urination but overall improved on trimethoprim.  On cystoscopy patient had diffuse erythema. She had white flecks in the urine. Some of the mucosa almost looked edematous. She had the urinate quite soon upon bladder filling.  The findings were in keeping with chronic cystitis and it was very diffuse  2 more pictures were taken and will be placed in the medical record   Picture was drawn. Patient understands she likely has chronic cystitis and that a biopsy would demonstrate inflammation. The differential diagnosis of carcinoma in situ was discussed. The role of referral for cystoscopy and biopsy was discussed. I want her to stay on trimethoprim.   I think she is willing to proceed with a bladder biopsy for safety reasons. She may be on a blood thinner  but it was not written in her medical record. I will see her in 6 months on trimethoprim. She understands a bladder biopsy will not change the natural history of her burning at the end of urination which she tolerates well. Call if culture positive   10/13/2022  Patient with a history of chronic cystitis. Has persistent erythema on cystoscopy and was referred to me for bladder biopsy. She denies any gross hematuria or dysuria. She maintains on trimethoprim UTI prophylaxis.   11/19/2022 Patient presents today for cystoscopy with bladder biopsy and fulguration.    ALLERGIES: None   MEDICATIONS: Trimethoprim 100 mg tablet 1 tablet PO Daily     Notes:  .   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)   NON-GU PMH: Atrial Fibrillation Cardiac murmur, unspecified GERD Hypercholesterolemia Hypertension Hyperthyroidism Other heart failure    FAMILY HISTORY: Cancer - Daughter Depression - Daughter Heart problem - Daughter Kidney Stones - Daughter Prostate Cancer - Brother renal failure - Aunt   SOCIAL HISTORY: None   REVIEW OF SYSTEMS:    GU Review Female:   Patient denies frequent urination, hard to postpone urination, burning /pain with urination, get up at night to urinate, leakage of urine, stream starts and stops, trouble starting your stream, have to strain to urinate, and being pregnant.  Gastrointestinal (Upper):   Patient denies nausea, vomiting, and indigestion/ heartburn.  Gastrointestinal (Lower):    Patient denies diarrhea and constipation.  Constitutional:   Patient denies fever, night sweats, weight loss, and fatigue.  Skin:   Patient denies skin rash/ lesion and itching.  Eyes:   Patient denies blurred vision and double vision.  Ears/ Nose/ Throat:   Patient denies sore throat and sinus problems.  Hematologic/Lymphatic:   Patient denies swollen glands and easy bruising.  Cardiovascular:   Patient denies leg swelling and chest pains.  Respiratory:   Patient denies cough and shortness of breath.  Endocrine:   Patient denies excessive thirst.  Musculoskeletal:   Patient denies back pain and joint pain.  Neurological:   Patient denies headaches and dizziness.  Psychologic:   Patient denies depression and anxiety.   VITAL SIGNS: None   MULTI-SYSTEM PHYSICAL EXAMINATION:    Constitutional: Well-nourished. No physical deformities. Normally developed. Good grooming.  Gastrointestinal: No mass, no tenderness, no rigidity, non obese abdomen.      ASSESSMENT:      ICD-10 Details  1 GU:   Microscopic hematuria - R31.21 Chronic,  Stable  2   Chronic cystitis (w/o hematuria) - N30.20 Chronic, Stable   PLAN:    proceed with cystoscopy with bladder biopsy and fulguration with bilateral retrograde pyelogram, possible TURBT. Risk benefits discussed including but not limited to bleeding, infection, injury to surrounding structures including bladder perforation requiring abdominal repair or prolonged catheterization.

## 2022-11-21 ENCOUNTER — Encounter (HOSPITAL_COMMUNITY): Payer: Self-pay | Admitting: Urology

## 2022-11-22 LAB — SURGICAL PATHOLOGY

## 2022-11-23 NOTE — Anesthesia Postprocedure Evaluation (Signed)
Anesthesia Post Note  Patient: [name]  Procedure(s) Performed: CYSTOSCOPY WITH TRANSURETHRAL RESECTION OF BLADDER TUMOR BILATERAL RETROGRADE PYELOGRAM (Bilateral)     Patient location during evaluation: PACU Anesthesia Type: General Level of consciousness: awake and alert Pain management: pain level controlled Vital Signs Assessment: post-procedure vital signs reviewed and stable Respiratory status: spontaneous breathing, nonlabored ventilation, respiratory function stable and patient connected to nasal cannula oxygen Cardiovascular status: blood pressure returned to baseline and stable Postop Assessment: no apparent nausea or vomiting Anesthetic complications: no   No notable events documented.  Last Vitals:  Vitals:   11/19/22 1545 11/19/22 1601  BP: 125/83 (!) 123/57  Pulse: 73   Resp: 18   Temp:    SpO2: 93% 93%    Last Pain:  Vitals:   11/19/22 1601  TempSrc:   PainSc: 4                  Quade Ramirez S

## 2022-11-24 ENCOUNTER — Telehealth: Payer: Self-pay | Admitting: *Deleted

## 2022-11-24 DIAGNOSIS — E1165 Type 2 diabetes mellitus with hyperglycemia: Secondary | ICD-10-CM

## 2022-11-24 NOTE — Patient Outreach (Addendum)
  Care Coordination   Initial Visit Note   11/24/2022 Name: Aailyah Dunbar MRN: [id number] DOB: [birth year]  Vonna Brabson is a 88 y.o. year old female who sees Deland Pretty, MD for primary care. I spoke with  Ella Bodo by phone today.  What matters to the patients health and wellness today?  Care Coordination program introduced- pt agreeable. Pt voicing she needs help getting some refills on RX- CSW assisted with [facility] and will ask Pharmacy team to reach out to pt as well.    Goals Addressed   None    Pt independent, lives alone, still drives and works part time (housekeeping) on M/W/F.  Pt worried about her medical bills and costs- states she owes money to [employer] and struggles with costs of RX's.  SDOH assessments and interventions completed:  Yes     Care Coordination Interventions:  Yes, provided   Follow up plan: Follow up call scheduled for 11/30/22    Encounter Outcome:  Pt. Visit Completed

## 2022-11-26 DIAGNOSIS — D090 Carcinoma in situ of bladder: Secondary | ICD-10-CM | POA: Diagnosis not present

## 2022-11-26 DIAGNOSIS — C678 Malignant neoplasm of overlapping sites of bladder: Secondary | ICD-10-CM | POA: Diagnosis not present

## 2022-11-26 DIAGNOSIS — R3121 Asymptomatic microscopic hematuria: Secondary | ICD-10-CM | POA: Diagnosis not present

## 2022-11-30 ENCOUNTER — Ambulatory Visit: Payer: Self-pay

## 2022-11-30 NOTE — Patient Instructions (Addendum)
Visit Information  Thank you for taking time to visit with me today. Please don't hesitate to contact me if I can be of assistance to you.   Following are the goals we discussed today:   Goals Addressed             This Visit's Progress    Diabetes Management       Patient Goals/Self Care Activities: -Patient/Caregiver will self-administer medications as prescribed as evidenced by self-report/primary caregiver report  -Patient/Caregiver will attend all scheduled provider appointments as evidenced by clinician review of documented attendance to scheduled appointments and patient/caregiver report -Patient/Caregiver will call provider office for new concerns or questions as evidenced by review of documented incoming telephone call notes and patient report  -check blood sugar at prescribed times -check blood sugar if I feel it is too high or too low -record values and write them down take them to all doctor visits  -Discussed low carbohydrate diet and limiting sweets  Seeing Nutritionist at Endocrinologist Patient also reports some problems with medication cost-pharmacy referral pending.  Interventions Today    Flowsheet Row Most Recent Value  Chronic Disease   Chronic disease during today's visit Diabetes  General Interventions   General Interventions Discussed/Reviewed General Interventions Discussed, Doctor Visits  Doctor Visits Discussed/Reviewed Doctor Visits Discussed  Exercise Interventions   Exercise Discussed/Reviewed Exercise Discussed  Education Interventions   Education Provided Provided Education  Provided Verbal Education On Nutrition, Blood Sugar Monitoring  Nutrition Interventions   Nutrition Discussed/Reviewed Nutrition Discussed, Decreasing sugar intake, Carbohydrate meal planning  Pharmacy Interventions   Pharmacy Dicussed/Reviewed Medications and their functions  [Pharmacy referral pending]             Our next appointment is by telephone on 12/29/22 at  1130  Please call the care guide team at [phone] if you need to cancel or reschedule your appointment.   If you are experiencing a Mental Health or [place] or need someone to talk to, please call the Suicide and Crisis Lifeline: 988   Patient verbalizes understanding of instructions and care plan provided today and agrees to view in [place]. Active MyChart status and patient understanding of how to access instructions and care plan via MyChart confirmed with patient.     The patient has been provided with contact information for the care management team and has been advised to call with any health related questions or concerns.    Jone Baseman, RN, MSN [place] [facility] Care Management Coordinator Direct Line [phone]

## 2022-11-30 NOTE — Patient Outreach (Addendum)
  Care Coordination   Initial Visit Note   11/30/2022 Name: [name] MRN: [id number] DOB: 1934/10/23  [name] is a 88 y.o. year old female who sees Deland Pretty, MD for primary care. I spoke with  Ella Bodo by phone today.Patient blood sugars are better per patient.  Using sliding scale insulin and sugars are in 200-300 range instead of 300-400 range.  Last A1c lower at 10.5.    What matters to the patients health and wellness today?  Blood sugar management    Goals Addressed             This Visit's Progress    Diabetes Management       Patient Goals/Self Care Activities: -Patient/Caregiver will self-administer medications as prescribed as evidenced by self-report/primary caregiver report  -Patient/Caregiver will attend all scheduled provider appointments as evidenced by clinician review of documented attendance to scheduled appointments and patient/caregiver report -Patient/Caregiver will call provider office for new concerns or questions as evidenced by review of documented incoming telephone call notes and patient report  -check blood sugar at prescribed times -check blood sugar if I feel it is too high or too low -record values and write them down take them to all doctor visits  -Discussed low carbohydrate diet and limiting sweets  Seeing Nutritionist at Endocrinologist Patient also reports some problems with medication cost-pharmacy referral pending.  Interventions Today    Flowsheet Row Most Recent Value  Chronic Disease   Chronic disease during today's visit Diabetes  General Interventions   General Interventions Discussed/Reviewed General Interventions Discussed, Doctor Visits  Doctor Visits Discussed/Reviewed Doctor Visits Discussed  Exercise Interventions   Exercise Discussed/Reviewed Exercise Discussed  Education Interventions   Education Provided Provided Education  Provided Verbal Education On Nutrition, Blood Sugar Monitoring  Nutrition  Interventions   Nutrition Discussed/Reviewed Nutrition Discussed, Decreasing sugar intake, Carbohydrate meal planning  Pharmacy Interventions   Pharmacy Dicussed/Reviewed Medications and their functions  [Pharmacy referral pending]             SDOH assessments and interventions completed:  Yes  SDOH Interventions Today    Flowsheet Row Most Recent Value  SDOH Interventions   Housing Interventions Intervention Not Indicated  Transportation Interventions Intervention Not Indicated  Utilities Interventions Intervention Not Indicated        Care Coordination Interventions:  Yes, provided   Follow up plan: Follow up call scheduled for March    Encounter Outcome:  Pt. Visit Completed   Jone Baseman, RN, MSN [place] [facility] Care Management Coordinator Direct Line [phone]

## 2022-12-07 ENCOUNTER — Other Ambulatory Visit: Payer: Self-pay | Admitting: Internal Medicine

## 2022-12-09 DIAGNOSIS — I129 Hypertensive chronic kidney disease with stage 1 through stage 4 chronic kidney disease, or unspecified chronic kidney disease: Secondary | ICD-10-CM | POA: Diagnosis not present

## 2022-12-09 DIAGNOSIS — E1122 Type 2 diabetes mellitus with diabetic chronic kidney disease: Secondary | ICD-10-CM | POA: Diagnosis not present

## 2022-12-09 DIAGNOSIS — N1831 Chronic kidney disease, stage 3a: Secondary | ICD-10-CM | POA: Diagnosis not present

## 2022-12-09 DIAGNOSIS — E039 Hypothyroidism, unspecified: Secondary | ICD-10-CM | POA: Diagnosis not present

## 2022-12-21 DIAGNOSIS — C678 Malignant neoplasm of overlapping sites of bladder: Secondary | ICD-10-CM | POA: Diagnosis not present

## 2022-12-21 DIAGNOSIS — R8271 Bacteriuria: Secondary | ICD-10-CM | POA: Diagnosis not present

## 2022-12-21 DIAGNOSIS — Z5111 Encounter for antineoplastic chemotherapy: Secondary | ICD-10-CM | POA: Diagnosis not present

## 2022-12-27 DIAGNOSIS — E1065 Type 1 diabetes mellitus with hyperglycemia: Secondary | ICD-10-CM | POA: Diagnosis not present

## 2022-12-29 ENCOUNTER — Ambulatory Visit: Payer: Self-pay

## 2022-12-29 DIAGNOSIS — R8271 Bacteriuria: Secondary | ICD-10-CM | POA: Diagnosis not present

## 2022-12-29 DIAGNOSIS — Z5111 Encounter for antineoplastic chemotherapy: Secondary | ICD-10-CM | POA: Diagnosis not present

## 2022-12-29 DIAGNOSIS — C678 Malignant neoplasm of overlapping sites of bladder: Secondary | ICD-10-CM | POA: Diagnosis not present

## 2022-12-29 NOTE — Patient Outreach (Signed)
  Care Coordination   Follow Up Visit Note   12/29/2022 Name: Julio Kaatz MRN: [id number] DOB: [birth year]  Shniya Ancel is a 88 y.o. year old female who sees Deland Pretty, MD for primary care. I spoke with  Ella Bodo by phone today.  What matters to the patients health and wellness today?  Begin bladder treatment today.   Patient reports there was some concern for some cancer cells in bladder after cystoscopy.  Patient states she has instructions for physician for after care and verbalize no questions.       Goals Addressed             This Visit's Progress    Diabetes Management       Patient Goals/Self Care Activities: -Patient/Caregiver will self-administer medications as prescribed as evidenced by self-report/primary caregiver report  -Patient/Caregiver will attend all scheduled provider appointments as evidenced by clinician review of documented attendance to scheduled appointments and patient/caregiver report -Patient/Caregiver will call provider office for new concerns or questions as evidenced by review of documented incoming telephone call notes and patient report  -check blood sugar at prescribed times -check blood sugar if I feel it is too high or too low -record values and write them down take them to all doctor visits  -Discussed low carbohydrate diet and limiting sweets  Seeing Nutritionist at Endocrinologist Patient currently having bladder treatments for 5 weeks.  First treatment today. Discussed taking it easy after treatments  Interventions Today    Flowsheet Row Most Recent Value  Chronic Disease   Chronic disease during today's visit Other, Diabetes  [Bladder treatment]  General Interventions   General Interventions Discussed/Reviewed General Interventions Reviewed, Doctor Visits  Doctor Visits Discussed/Reviewed Doctor Visits Discussed, Specialist  PCP/Specialist Visits Compliance with follow-up visit  Education Interventions   Education Provided  Provided Education  Provided Verbal Education On Nutrition, Blood Sugar Monitoring, Other  [bladder treatment and follow up]  Nutrition Interventions   Nutrition Discussed/Reviewed Fluid intake, Decreasing sugar intake, Portion sizes, Nutrition Reviewed             SDOH assessments and interventions completed:  Yes     Care Coordination Interventions:  Yes, provided   Follow up plan: Follow up call scheduled for April    Encounter Outcome:  Pt. Visit Completed   Jone Baseman, RN, MSN [place] [facility] Care Management Coordinator Direct Line [phone]

## 2022-12-29 NOTE — Patient Instructions (Signed)
Visit Information  Thank you for taking time to visit with me today. Please don't hesitate to contact me if I can be of assistance to you.   Following are the goals we discussed today:   Goals Addressed             This Visit's Progress    Diabetes Management       Patient Goals/Self Care Activities: -Patient/Caregiver will self-administer medications as prescribed as evidenced by self-report/primary caregiver report  -Patient/Caregiver will attend all scheduled provider appointments as evidenced by clinician review of documented attendance to scheduled appointments and patient/caregiver report -Patient/Caregiver will call provider office for new concerns or questions as evidenced by review of documented incoming telephone call notes and patient report  -check blood sugar at prescribed times -check blood sugar if I feel it is too high or too low -record values and write them down take them to all doctor visits  -Discussed low carbohydrate diet and limiting sweets  Seeing Nutritionist at Endocrinologist Patient currently having bladder treatments for 5 weeks.  First treatment today. Discussed taking it easy after treatments  Interventions Today    Flowsheet Row Most Recent Value  Chronic Disease   Chronic disease during today's visit Other, Diabetes  [Bladder treatment]  General Interventions   General Interventions Discussed/Reviewed General Interventions Reviewed, Doctor Visits  Doctor Visits Discussed/Reviewed Doctor Visits Discussed, Specialist  PCP/Specialist Visits Compliance with follow-up visit  Education Interventions   Education Provided Provided Education  Provided Verbal Education On Nutrition, Blood Sugar Monitoring, Other  [bladder treatment and follow up]  Nutrition Interventions   Nutrition Discussed/Reviewed Fluid intake, Decreasing sugar intake, Portion sizes, Nutrition Reviewed             Our next appointment is by telephone on 02/03/23 at  1130  Please call the care guide team at [phone] if you need to cancel or reschedule your appointment.   If you are experiencing a Mental Health or [place] or need someone to talk to, please call the Suicide and Crisis Lifeline: 988   Patient verbalizes understanding of instructions and care plan provided today and agrees to view in [place]. Active MyChart status and patient understanding of how to access instructions and care plan via MyChart confirmed with patient.     The patient has been provided with contact information for the care management team and has been advised to call with any health related questions or concerns.   Jone Baseman, RN, MSN [place] [facility] Care Management Coordinator Direct Line [phone]

## 2023-01-05 DIAGNOSIS — Z5111 Encounter for antineoplastic chemotherapy: Secondary | ICD-10-CM | POA: Diagnosis not present

## 2023-01-05 DIAGNOSIS — C678 Malignant neoplasm of overlapping sites of bladder: Secondary | ICD-10-CM | POA: Diagnosis not present

## 2023-01-10 ENCOUNTER — Encounter (HOSPITAL_COMMUNITY): Payer: Self-pay

## 2023-01-10 ENCOUNTER — Emergency Department (HOSPITAL_COMMUNITY): Payer: Medicare PPO

## 2023-01-10 ENCOUNTER — Emergency Department (HOSPITAL_COMMUNITY)
Admission: EM | Admit: 2023-01-10 | Discharge: 2023-01-10 | Disposition: A | Discharge status: Home / Self Care | Payer: Medicare PPO | Attending: Emergency Medicine | Admitting: Emergency Medicine

## 2023-01-10 DIAGNOSIS — M1612 Unilateral primary osteoarthritis, left hip: Secondary | ICD-10-CM | POA: Insufficient documentation

## 2023-01-10 DIAGNOSIS — N3080 Other cystitis without hematuria: Secondary | ICD-10-CM | POA: Diagnosis not present

## 2023-01-10 DIAGNOSIS — R42 Dizziness and giddiness: Secondary | ICD-10-CM | POA: Diagnosis not present

## 2023-01-10 DIAGNOSIS — Z7901 Long term (current) use of anticoagulants: Secondary | ICD-10-CM | POA: Diagnosis not present

## 2023-01-10 DIAGNOSIS — I4891 Unspecified atrial fibrillation: Secondary | ICD-10-CM | POA: Diagnosis not present

## 2023-01-10 DIAGNOSIS — R739 Hyperglycemia, unspecified: Secondary | ICD-10-CM

## 2023-01-10 DIAGNOSIS — E1165 Type 2 diabetes mellitus with hyperglycemia: Secondary | ICD-10-CM | POA: Insufficient documentation

## 2023-01-10 DIAGNOSIS — Z794 Long term (current) use of insulin: Secondary | ICD-10-CM | POA: Diagnosis not present

## 2023-01-10 DIAGNOSIS — C679 Malignant neoplasm of bladder, unspecified: Secondary | ICD-10-CM | POA: Diagnosis not present

## 2023-01-10 DIAGNOSIS — N179 Acute kidney failure, unspecified: Secondary | ICD-10-CM | POA: Diagnosis not present

## 2023-01-10 DIAGNOSIS — R109 Unspecified abdominal pain: Secondary | ICD-10-CM | POA: Diagnosis not present

## 2023-01-10 DIAGNOSIS — T50A95A Adverse effect of other bacterial vaccines, initial encounter: Secondary | ICD-10-CM | POA: Diagnosis not present

## 2023-01-10 DIAGNOSIS — K5730 Diverticulosis of large intestine without perforation or abscess without bleeding: Secondary | ICD-10-CM | POA: Diagnosis not present

## 2023-01-10 DIAGNOSIS — M25552 Pain in left hip: Secondary | ICD-10-CM | POA: Diagnosis not present

## 2023-01-10 DIAGNOSIS — N3289 Other specified disorders of bladder: Secondary | ICD-10-CM | POA: Diagnosis not present

## 2023-01-10 LAB — URINALYSIS, ROUTINE W REFLEX MICROSCOPIC
Bilirubin Urine: NEGATIVE
Glucose, UA: >=500 mg/dL — AB
Ketones, ur: 5 mg/dL — AB
Nitrite: NEGATIVE
Protein, ur: 30 mg/dL — AB
Specific Gravity, Urine: 1.02 (ref 1.005–1.030)
WBC, UA: >50 WBC/hpf (ref 0–5)
pH: 5 (ref 5.0–8.0)

## 2023-01-10 LAB — COMPREHENSIVE METABOLIC PANEL
ALT: 34 U/L (ref 0–44)
AST: 45 U/L — ABNORMAL HIGH (ref 15–41)
Albumin: 4.2 g/dL (ref 3.5–5.0)
Alkaline Phosphatase: 98 U/L (ref 38–126)
Anion gap: 14 (ref 5–15)
BUN: 38 mg/dL — ABNORMAL HIGH (ref 8–23)
CO2: 17 mmol/L — ABNORMAL LOW (ref 22–32)
Calcium: 9.3 mg/dL (ref 8.9–10.3)
Chloride: 97 mmol/L — ABNORMAL LOW (ref 98–111)
Creatinine, Ser: 1.96 mg/dL — ABNORMAL HIGH (ref 0.44–1.00)
GFR, Estimated: 25 mL/min — ABNORMAL LOW (ref >60)
Glucose, Bld: 368 mg/dL — ABNORMAL HIGH (ref 70–99)
Potassium: 4.8 mmol/L (ref 3.5–5.1)
Sodium: 128 mmol/L — ABNORMAL LOW (ref 135–145)
Total Bilirubin: 1 mg/dL (ref 0.3–1.2)
Total Protein: 8.3 g/dL — ABNORMAL HIGH (ref 6.5–8.1)

## 2023-01-10 LAB — CBC WITH DIFFERENTIAL/PLATELET
Abs Immature Granulocytes: 0.02 10*3/uL (ref 0.00–0.07)
Basophils Absolute: 0.1 10*3/uL (ref 0.0–0.1)
Basophils Relative: 1 %
Eosinophils Absolute: 0 10*3/uL (ref 0.0–0.5)
Eosinophils Relative: 0 %
HCT: 40.6 % (ref 36.0–46.0)
Hemoglobin: 13.1 g/dL (ref 12.0–15.0)
Immature Granulocytes: 0 %
Lymphocytes Relative: 16 %
Lymphs Abs: 1.3 10*3/uL (ref 0.7–4.0)
MCH: 28.1 pg (ref 26.0–34.0)
MCHC: 32.3 g/dL (ref 30.0–36.0)
MCV: 86.9 fL (ref 80.0–100.0)
Monocytes Absolute: 0.5 10*3/uL (ref 0.1–1.0)
Monocytes Relative: 6 %
Neutro Abs: 6.2 10*3/uL (ref 1.7–7.7)
Neutrophils Relative %: 77 %
Platelets: 285 10*3/uL (ref 150–400)
RBC: 4.67 MIL/uL (ref 3.87–5.11)
RDW: 14.3 % (ref 11.5–15.5)
WBC: 8.1 10*3/uL (ref 4.0–10.5)
nRBC: 0 % (ref 0.0–0.2)

## 2023-01-10 LAB — CBG MONITORING, ED
Glucose-Capillary: 527 mg/dL (ref 70–99)
Glucose-Capillary: 292 mg/dL — ABNORMAL HIGH (ref 70–99)

## 2023-01-10 LAB — LIPASE, BLOOD: Lipase: 26 U/L (ref 11–51)

## 2023-01-10 MED ORDER — LACTATED RINGERS IV BOLUS
1000.0000 mL | Freq: Once | INTRAVENOUS | Status: AC
  Administered 2023-01-10: 1000 mL via INTRAVENOUS

## 2023-01-10 NOTE — Discharge Instructions (Signed)
You were seen in the emergency department for your hip pain.  Your blood sugar was high but you had no signs of complications from her blood sugar being high and your kidney function was slightly elevated from your baseline.  You can follow-up with your primary doctor in the next few days to have your blood sugar and kidney function rechecked.  You can continue to take Tylenol as needed for your hip pain.  Your primary doctor can reassess your hip as well and if you are having continued pain you may need physical therapy.  You should return to the emergency department if your blood sugars are too high for your machine to read, you have abdominal pain, you have repetitive vomiting, you are unable to walk or if you have any other new or concerning symptoms.

## 2023-01-10 NOTE — ED Triage Notes (Signed)
Pt c/o left hip pain.  Pt's sugar is also 527. Pt staste she has only taken insulin 1x today.

## 2023-01-10 NOTE — ED Provider Triage Note (Signed)
Emergency Medicine Provider Triage Evaluation Note  [name] , a 88 y.o. female  was evaluated in triage.  Pt complains of hip pain. Report having a bladder biopsy last week.  Was doing well for 2 days but then for the past 3 days she has pain to L hip, feeling fatigue, nausea, polyuria and polydipsia.  No dysuria, no back pain, no injury  Review of Systems  Positive: As above Negative: As above  Physical Exam  BP (!) 165/76 (BP Location: Right Arm)   Pulse 82   Temp 98.4 F (36.9 C) (Oral)   Resp 16   Ht 5\' 2"  (1.575 m)   Wt 62.6 kg   SpO2 99%   BMI 25.24 kg/m  Gen:   Awake, no distress   Resp:  Normal effort  MSK:   Moves extremities without difficulty  Other:    Medical Decision Making  Medically screening exam initiated at 3:39 PM.  Appropriate orders placed.  [name] was informed that the remainder of the evaluation will be completed by another provider, this initial triage assessment does not replace that evaluation, and the importance of remaining in the ED until their evaluation is complete.     Domenic Moras, PA-C 01/10/23 1540

## 2023-01-10 NOTE — ED Notes (Signed)
Pt usually takes 12 units of insulin tid. Pt stated she did not eat lunch today, therefore she missed the second dose of insulin.

## 2023-01-10 NOTE — ED Notes (Signed)
Will perform US PIV shortly. 

## 2023-01-10 NOTE — ED Provider Notes (Signed)
[place] EMERGENCY DEPARTMENT AT [facility] Provider Note   CSN: [id number] Arrival date & time: 01/10/23  1516     History  Chief Complaint  Patient presents with   Hyperglycemia   Hip Pain    [name] is a 88 y.o. female.  Patient is an 88 year old female with a past medical history of A-fib on Eliquis, hematuria UTIs status post recent cystoscopy on trimethoprim, diabetes presenting to the emergency department with left hip pain.  She states that she has had pain in her left hip for the last 3 days.  She denies any trauma or falls.  She states that she feels the pain when she lays in bed at night when she wakes up in the morning but resolves with Tylenol and she has no pain with ambulation or activity.  She states that she has also been feeling increasingly weak and fatigued.  She states that she noted that her blood sugar was significantly elevated today which prompted her to come to the emergency department.  She states that she has been taking her medications as prescribed.  The history is provided by the patient and a relative.  Hyperglycemia Hip Pain       Home Medications Prior to Admission medications   Medication Sig Start Date End Date Taking? Authorizing Provider  amiodarone (PACERONE) 200 MG tablet Take 1 tablet (200 mg total) by mouth daily. Direction change 05/04/22   Mercy Riding, MD  Ascorbic Acid (VITAMIN C) 100 MG tablet Take 100 mg by mouth daily.    [provider]  calcium carbonate (TUMS - DOSED IN MG ELEMENTAL CALCIUM) 500 MG chewable tablet Chew 1 tablet by mouth daily.    [provider]  ELIQUIS 2.5 MG TABS tablet TAKE 1 TABLET BY MOUTH TWICE DAILY 12/08/22   Hilty, Nadean Corwin, MD  FeFum-FePoly-FA-B Cmp-C-Biot (FOLIVANE-PLUS) CAPS TAKE 1 CAPSULE BY MOUTH EVERY DAY IN THE MORNING 10/14/22   Heilingoetter, Cassandra L, PA-C  furosemide (LASIX) 20 MG tablet Take 20 mg by mouth daily as needed for fluid or edema. 10/14/22    [provider]  HYDROcodone-acetaminophen (NORCO) 5-325 MG tablet Take 1 tablet by mouth every 4 (four) hours as needed for moderate pain. 11/19/22   Marton Redwood III, MD  insulin aspart (NOVOLOG FLEXPEN) 100 UNIT/ML FlexPen Inject 8 Units into the skin 3 (three) times daily with meals. Patient taking differently: Inject 12 Units into the skin 3 (three) times daily with meals. 09/04/21   Mariel Aloe, MD  insulin glargine, 2 Unit Dial, (TOUJEO MAX SOLOSTAR) 300 UNIT/ML Solostar Pen Inject 14 Units into the skin at bedtime. Patient taking differently: Inject 18 Units into the skin at bedtime. 05/04/22   Mercy Riding, MD  levothyroxine (SYNTHROID) 88 MCG tablet Take 1 tablet (88 mcg total) by mouth daily before breakfast. 02/08/22   Bonnielee Haff, MD  pantoprazole (PROTONIX) 40 MG tablet Take 1 tablet (40 mg total) by mouth daily at 6 (six) AM. 02/09/22   Bonnielee Haff, MD  polyethylene glycol (MIRALAX / GLYCOLAX) 17 g packet Take 17 g by mouth daily. Patient taking differently: Take 17 g by mouth daily as needed for moderate constipation. 02/09/22   Bonnielee Haff, MD  rosuvastatin (CRESTOR) 20 MG tablet Take 20 mg by mouth at bedtime. 10/14/22   [provider]  senna-docusate (SENOKOT-S) 8.6-50 MG tablet Take 2 tablets by mouth at bedtime. 02/08/22   Bonnielee Haff, MD  TIADYLT ER 180 MG  24 hr capsule TAKE 1 CAPSULE BY MOUTH EVERY DAY 11/01/22   Hilty, Nadean Corwin, MD  trimethoprim (TRIMPEX) 100 MG tablet Take 100 mg by mouth daily. 10/27/22   [provider]      Allergies    Patient has no known allergies.    Review of Systems   Review of Systems  Physical Exam Updated Vital Signs BP (!) 164/69   Pulse 76   Temp 98.4 F (36.9 C) (Oral)   Resp 18   Ht 5\' 2"  (1.575 m)   Wt 62.6 kg   SpO2 98%   BMI 25.24 kg/m  Physical Exam Vitals and nursing note reviewed.  Constitutional:      General: She is not in acute distress.    Appearance: Normal appearance.   HENT:     Head: Normocephalic and atraumatic.     Nose: Nose normal.     Mouth/Throat:     Mouth: Mucous membranes are moist.     Pharynx: Oropharynx is clear.  Eyes:     Extraocular Movements: Extraocular movements intact.     Conjunctiva/sclera: Conjunctivae normal.  Cardiovascular:     Rate and Rhythm: Normal rate and regular rhythm.     Pulses: Normal pulses.     Heart sounds: Normal heart sounds.  Pulmonary:     Effort: Pulmonary effort is normal.     Breath sounds: Normal breath sounds.  Abdominal:     General: Abdomen is flat.     Palpations: Abdomen is soft.     Tenderness: There is abdominal tenderness (L flank). There is no right CVA tenderness or left CVA tenderness.  Musculoskeletal:        General: Tenderness (L lateral hip, no pain with internal/external rotation or hip flexion, negative straight leg raise) present. Normal range of motion.     Cervical back: Normal range of motion and neck supple.  Skin:    General: Skin is warm and dry.     Findings: No bruising or rash.  Neurological:     General: No focal deficit present.     Mental Status: She is alert and oriented to person, place, and time.     Sensory: No sensory deficit.     Motor: No weakness.  Psychiatric:        Mood and Affect: Mood normal.        Behavior: Behavior normal.     ED Results / Procedures / Treatments   Labs (all labs ordered are listed, but only abnormal results are displayed) Labs Reviewed  COMPREHENSIVE METABOLIC PANEL - Abnormal; Notable for the following components:      Result Value   Sodium 128 (*)    Chloride 97 (*)    CO2 17 (*)    Glucose, Bld 368 (*)    BUN 38 (*)    Creatinine, Ser 1.96 (*)    Total Protein 8.3 (*)    AST 45 (*)    GFR, Estimated 25 (*)    All other components within normal limits  URINALYSIS, ROUTINE W REFLEX MICROSCOPIC - Abnormal; Notable for the following components:   APPearance CLOUDY (*)    Glucose, UA >=500 (*)    Hgb urine dipstick  LARGE (*)    Ketones, ur 5 (*)    Protein, ur 30 (*)    Leukocytes,Ua LARGE (*)    Bacteria, UA MANY (*)    All other components within normal limits  CBG MONITORING, ED - Abnormal; Notable for the  following components:   Glucose-Capillary 527 (*)    All other components within normal limits  CBG MONITORING, ED - Abnormal; Notable for the following components:   Glucose-Capillary 292 (*)    All other components within normal limits  URINE CULTURE  CBC WITH DIFFERENTIAL/PLATELET  LIPASE, BLOOD    EKG None  Radiology CT Renal Stone Study  Result Date: 01/10/2023 CLINICAL DATA:  Bladder biopsy left hip pain EXAM: CT ABDOMEN AND PELVIS WITHOUT CONTRAST TECHNIQUE: Multidetector CT imaging of the abdomen and pelvis was performed following the standard protocol without IV contrast. RADIATION DOSE REDUCTION: This exam was performed according to the departmental dose-optimization program which includes automated exposure control, adjustment of the mA and/or kV according to patient size and/or use of iterative reconstruction technique. COMPARISON:  CT 03/15/2011 FINDINGS: Lower chest: Lung bases demonstrate no acute airspace disease. Mild cardiomegaly. Hepatobiliary: Status post cholecystectomy. No focal hepatic abnormality. Enlarged appearing common bile duct measuring up to 10 mm. Pancreas: No inflammation. Focal hypodensity at the mid pancreas, series 2, image 25 indeterminate for cystic lesion versus ductal dilatation. Spleen: Normal in size without focal abnormality. Adrenals/Urinary Tract: Adrenal glands are unremarkable. Kidneys are normal, without renal calculi, focal lesion, or hydronephrosis. Bladder is slightly thick walled on the left side with mild perivesical stranding. Small focus of air in the bladder. Stomach/Bowel: Stomach is nonenlarged. No dilated small bowel. No acute bowel wall thickening. Diverticular disease of the left colon Vascular/Lymphatic: Advanced aortic atherosclerosis. No  aneurysm. No suspicious lymph nodes. Reproductive: Hysterectomy.  No adnexal mass Other: No pelvic effusion or free air. Postsurgical changes of the lower anterior abdominal wall Musculoskeletal: No acute or suspicious osseous abnormality. IMPRESSION: 1. No CT evidence for acute intra-abdominal or pelvic abnormality. Negative for hydronephrosis 2. Slightly thick-walled appearance of the urinary bladder on the left side with mild perivesical stranding, this could be due to infection, focal inflammatory process, or a mass. Small focus of gas in the bladder presumably related to recent history of biopsy 3. Focal hypodensity at the mid pancreas, indeterminate for cystic lesion versus ductal dilatation. When the patient is clinically stable and able to follow directions and hold their breath (preferably as an outpatient) further evaluation with dedicated abdominal MRI should be considered. 4. Diverticular disease of the left colon without acute inflammatory process. 5. Aortic atherosclerosis. Aortic Atherosclerosis (ICD10-I70.0). Electronically Signed   By: Donavan Foil M.D.   On: 01/10/2023 20:29   DG Hip Unilat W or Wo Pelvis 2-3 Views Left  Result Date: 01/10/2023 CLINICAL DATA:  Pain left hip EXAM: DG HIP (WITH OR WITHOUT PELVIS) 2-3V LEFT COMPARISON:  06/07/2017 FINDINGS: No fracture or dislocation is seen in left hip. No focal lytic lesions are seen. There is no significant narrowing of joint space. Small bony spurs are noted in left hip. Degenerative changes are noted in the visualized lower lumbar spine. IMPRESSION: No recent fracture or dislocation is seen. Mild degenerative changes with small bony spurs are noted in the left hip. Lumbar spondylosis. Electronically Signed   By: Elmer Picker M.D.   On: 01/10/2023 16:03    Procedures Procedures    Medications Ordered in ED Medications  lactated ringers bolus 1,000 mL (0 mLs Intravenous Stopped 01/10/23 2131)    ED Course/ Medical Decision  Making/ A&P Clinical Course as of 01/10/23 2134  Mon Jan 10, 2023  2127 Patient's CT without acute abnormality.  Patient is stable for discharge home with primary care follow-up and was given strict return precautions. [  VK]    Clinical Course User Index [VK] Kemper Durie, DO                             Medical Decision Making This patient presents to the ED with chief complaint(s) of L hip pain with pertinent past medical history of a fib on Eliquis, DM, bladder mass s/p recent biopsy which further complicates the presenting complaint. The complaint involves an extensive differential diagnosis and also carries with it a high risk of complications and morbidity.    The differential diagnosis includes fracture, dislocation, arthritis, UTI, pyelonephritis, nephrolithiasis, no overlying skin changes making shingles unlikely, negative straight leg raise making sciatica unlikely  Additional history obtained: Additional history obtained from family Records reviewed previous admission documents  ED Course and Reassessment: On patient's arrival to the emergency department she was initially evaluated by provider in triage and had x-ray, labs and urine performed.  Accu-Chek showed initial glucose of 527 and 368 on labs.  She was given a liter of IV fluids.  No evidence of DKA.  Urine does have blood in it consistent with recent biopsy and many bacteria however she reports that she is currently on antibiotics.  Urine culture will be sent.  Hip x-ray showed mild osteoarthritis.  The patient's pain seems to be more proximal to the left hip and will have a CT performed to evaluate for possible intra abdominal abnormality as cause of her pain and she will be closely reassessed.  Independent labs interpretation:  The following labs were independently interpreted: Hyperglycemia without DKA  Independent visualization of imaging: - I independently visualized the following imaging with scope of  interpretation limited to determining acute life threatening conditions related to emergency care: Left hip x-ray, CT AP, which revealed osteoarthritis of the left hip, no acute abnormalities on CT  Consultation: - Consulted or discussed management/test interpretation w/ external professional: N/A  Consideration for admission or further workup: Patient has no emergent conditions requiring admission or further work-up at this time and is stable for discharge home with primary care follow-up  Social Determinants of health: N/A    Amount and/or Complexity of Data Reviewed Labs: ordered. Radiology: ordered.          Final Clinical Impression(s) / ED Diagnoses Final diagnoses:  AKI (acute kidney injury)  Hyperglycemia  Osteoarthritis of left hip, unspecified osteoarthritis type    Rx / DC Orders ED Discharge Orders     None         Kemper Durie, DO 01/10/23 2134

## 2023-01-10 NOTE — ED Notes (Signed)
Called lab to add on urine culture - spoke with Ovid Curd who advised they will run it.

## 2023-01-12 DIAGNOSIS — C678 Malignant neoplasm of overlapping sites of bladder: Secondary | ICD-10-CM | POA: Diagnosis not present

## 2023-01-12 DIAGNOSIS — R8271 Bacteriuria: Secondary | ICD-10-CM | POA: Diagnosis not present

## 2023-01-12 LAB — URINE CULTURE: Culture: >=100000 — AB

## 2023-01-13 ENCOUNTER — Telehealth (HOSPITAL_BASED_OUTPATIENT_CLINIC_OR_DEPARTMENT_OTHER): Payer: Self-pay

## 2023-01-13 NOTE — Telephone Encounter (Signed)
Post ED Visit - Positive Culture Follow-up: Unsuccessful Patient Follow-up  Culture assessed and recommendations reviewed by:  []  Elenor Quinones, Pharm.D. []  Heide Guile, Pharm.D., BCPS AQ-ID []  Parks Neptune, Pharm.D., BCPS []  Alycia Rossetti, Pharm.D., BCPS []  [place], Pharm.D., BCPS, AAHIVP []  Legrand Como, Pharm.D., BCPS, AAHIVP []  Wynell Balloon, PharmD []  Vincenza Hews, PharmD, BCPS  Positive urine culture  [x]  Patient discharged without antimicrobial prescription and treatment is now indicated []  Organism is resistant to prescribed ED discharge antimicrobial []  Patient with positive blood cultures  Unable to leave voicemail concerning Urine cultures.   Plan: if having symptoms start Cefadroxil 500 mg po daily x 5 days per ED provider Karen Carnes, PA.   Unable to contact patient after 3 attempts, letter will be sent to address on file  [name] 01/13/2023, 10:48 AM

## 2023-01-13 NOTE — Telephone Encounter (Signed)
Post ED Visit - Positive Culture Follow-up: Successful Patient Follow-Up  Culture assessed and recommendations reviewed by:  []  Elenor Quinones, Pharm.D. []  Heide Guile, Pharm.D., BCPS AQ-ID []  Parks Neptune, Pharm.D., BCPS []  Alycia Rossetti, Pharm.D., BCPS []  [place], Pharm.D., BCPS, AAHIVP []  Legrand Como, Pharm.D., BCPS, AAHIVP []  Salome Arnt, PharmD, BCPS []  Johnnette Gourd, PharmD, BCPS []  Hughes Better, PharmD, BCPS []  Leeroy Cha, PharmD  Positive urine culture  [x]  Patient discharged without antimicrobial prescription and treatment is now indicated []  Organism is resistant to prescribed ED discharge antimicrobial []  Patient with positive blood cultures  Plan: If having symptoms start Cefadroxil 500 mg po daily x 5 days.   Pt returned call and denies having any symptoms, states she will follow up with her provider. No antibiotics called in.   Changes discussed with ED provider: Quincy Carnes, PA   Contacted patient, date 01/13/2023, time 3:00 pm   Glennon Hamilton 01/13/2023, 3:14 PM

## 2023-01-13 NOTE — Progress Notes (Signed)
ED Antimicrobial Stewardship Positive Culture Follow Up   [name] is an 88 y.o. female who presented to [facility] on 01/10/2023 with a chief complaint of  Chief Complaint  Patient presents with   Hyperglycemia   Hip Pain    Recent Results (from the past 720 hour(s))  Urine Culture (for pregnant, neutropenic or urologic patients or patients with an indwelling urinary catheter)     Status: Abnormal   Collection Time: 01/10/23  3:49 PM   Specimen: Urine, Clean Catch  Result Value Ref Range Status   Specimen Description   Final    URINE, CLEAN CATCH Performed at [facility], [place] [address] [place], [place] [address]    Special Requests   Final    NONE Performed at [facility], [place] [address] [place], [place] [address]    Culture >=100,000 COLONIES/mL ESCHERICHIA COLI (A)  Final   Report Status 01/12/2023 FINAL  Final   Organism ID, Bacteria ESCHERICHIA COLI (A)  Final      Susceptibility   Escherichia coli - MIC*    AMPICILLIN >=32 RESISTANT Resistant     CEFAZOLIN <=4 SENSITIVE Sensitive     CEFEPIME <=0.12 SENSITIVE Sensitive     CEFTRIAXONE <=0.25 SENSITIVE Sensitive     CIPROFLOXACIN >=4 RESISTANT Resistant     GENTAMICIN <=1 SENSITIVE Sensitive     IMIPENEM <=0.25 SENSITIVE Sensitive     NITROFURANTOIN <=16 SENSITIVE Sensitive     TRIMETH/SULFA >=320 RESISTANT Resistant     AMPICILLIN/SULBACTAM 16 INTERMEDIATE Intermediate     PIP/TAZO <=4 SENSITIVE Sensitive     * >=100,000 COLONIES/mL ESCHERICHIA COLI   78 YOF with CC hyperglycemia and hip pain. No urinary symptoms reported. In February patient underwent transuretheral resection of bladder tumor, pathology revealed urothelial CIS. UA with >50 WBC, large leukocytes, many bacteria, 0-5 squamous cells.   [x]  Patient discharged originally without antimicrobial agent and treatment is now indicated  New antibiotic prescription: If having symptoms, start cefadroxil 500 mg PO daily  x 5 days.  Recommend following up need for trimethoprim prophylaxis outpatient.   ED Provider: Quincy Carnes, PA-C   Eliseo Gum, PharmD PGY1 Pharmacy Resident   01/13/2023  8:37 AM  Clinical Pharmacist Monday - Friday phone -  [phone] Saturday - Sunday phone - [phone]

## 2023-01-14 ENCOUNTER — Telehealth: Payer: Self-pay

## 2023-01-14 NOTE — Telephone Encounter (Signed)
        Patient  visited [facility] on 01/10/2023  for hyperglycemia, hip pain.   Telephone encounter attempt :  1st  A HIPAA compliant voice message was left requesting a return call.  Instructed patient to call back at [phone].   Katrell Milhorn [facility]  [facility] Population Health Community Resource Care Guide   [email or web address]  ?? [phone]   Website: [email or web address]  [email or web address]

## 2023-01-17 ENCOUNTER — Telehealth: Payer: Self-pay

## 2023-01-17 IMAGING — CR DG CHEST 2V
2 series · 2 of 2 positions shown · non-contrast
Comparison: Chest x-ray 12/31/2021.

CLINICAL DATA: Weakness.

EXAM:
CHEST - 2 VIEW

[w chest pa]
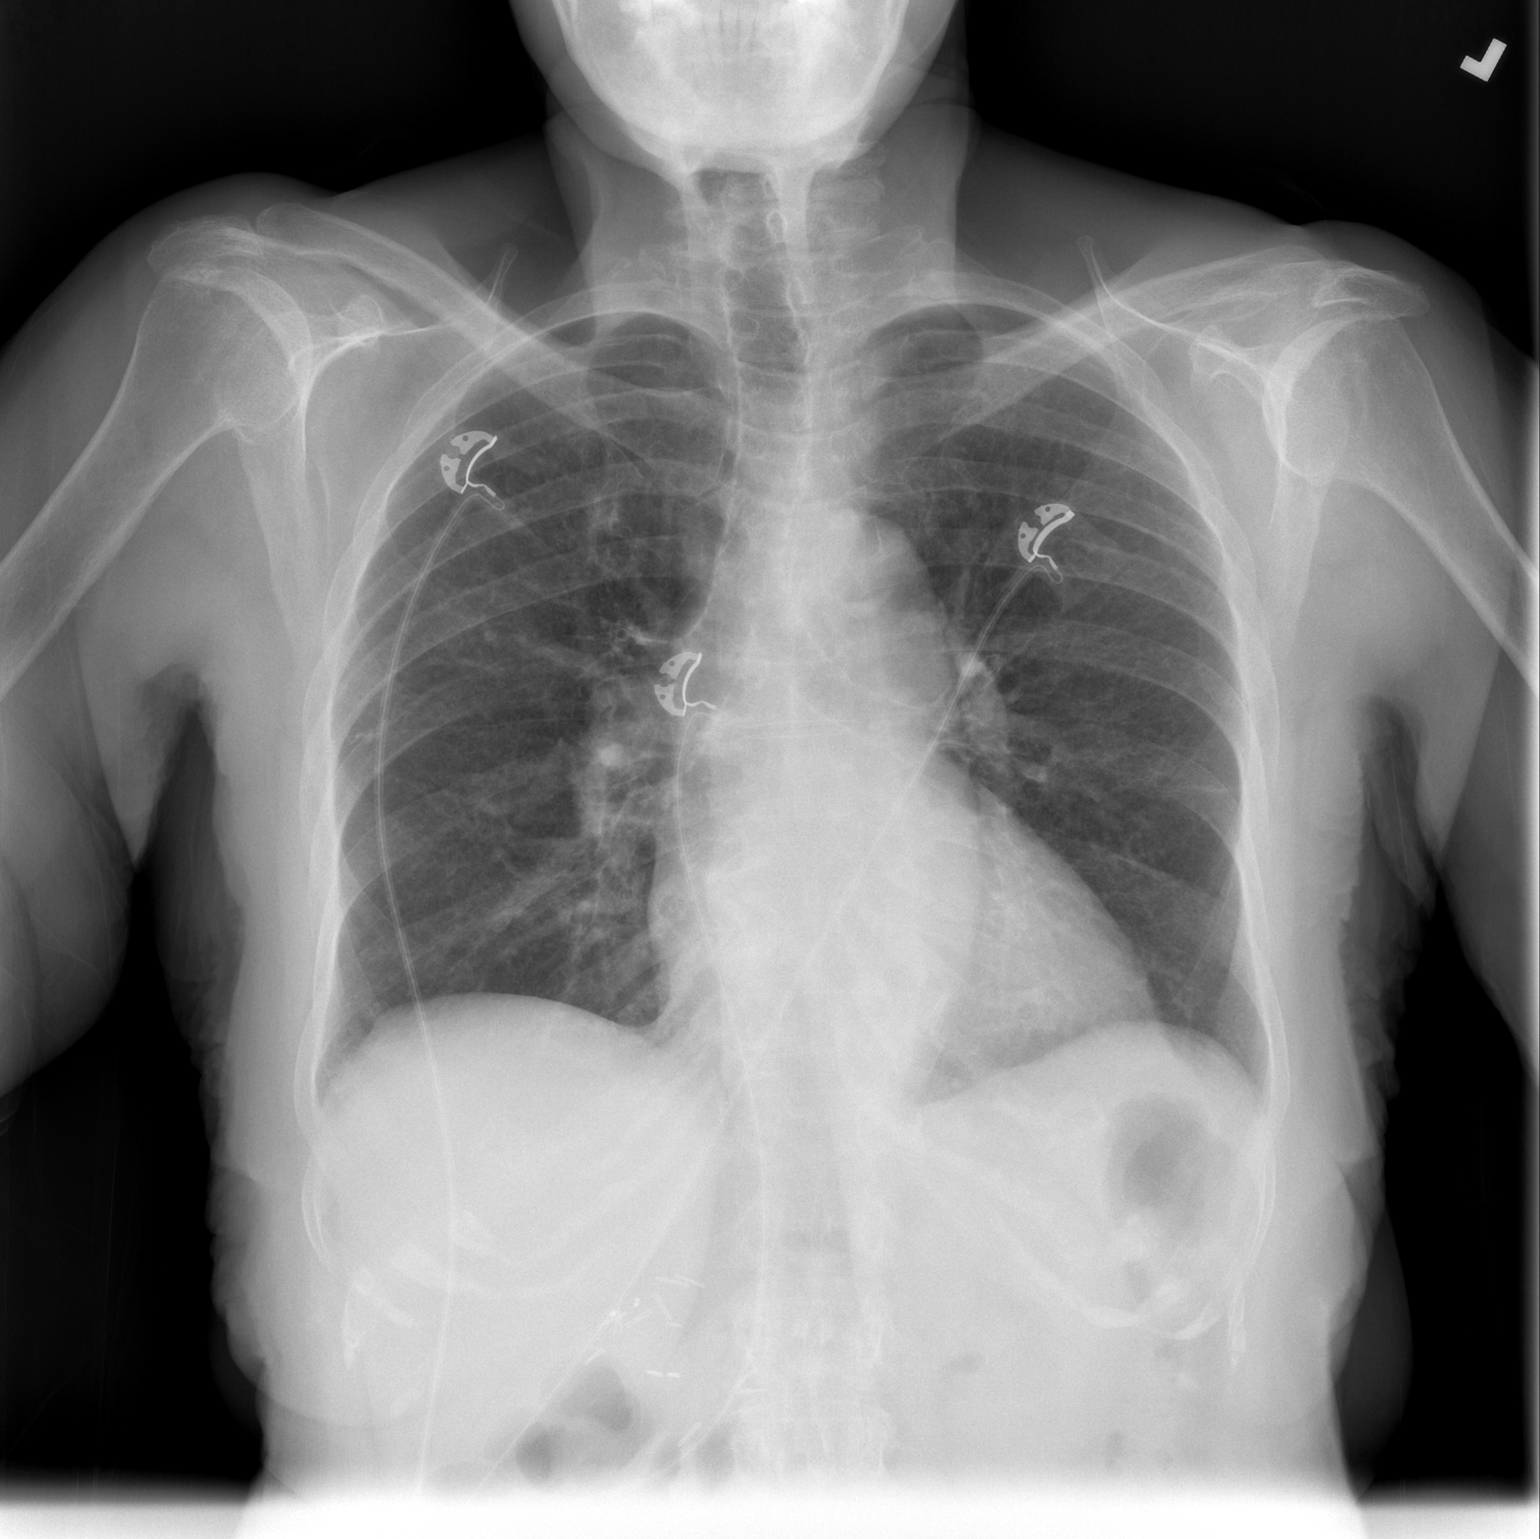

[w chest lat]
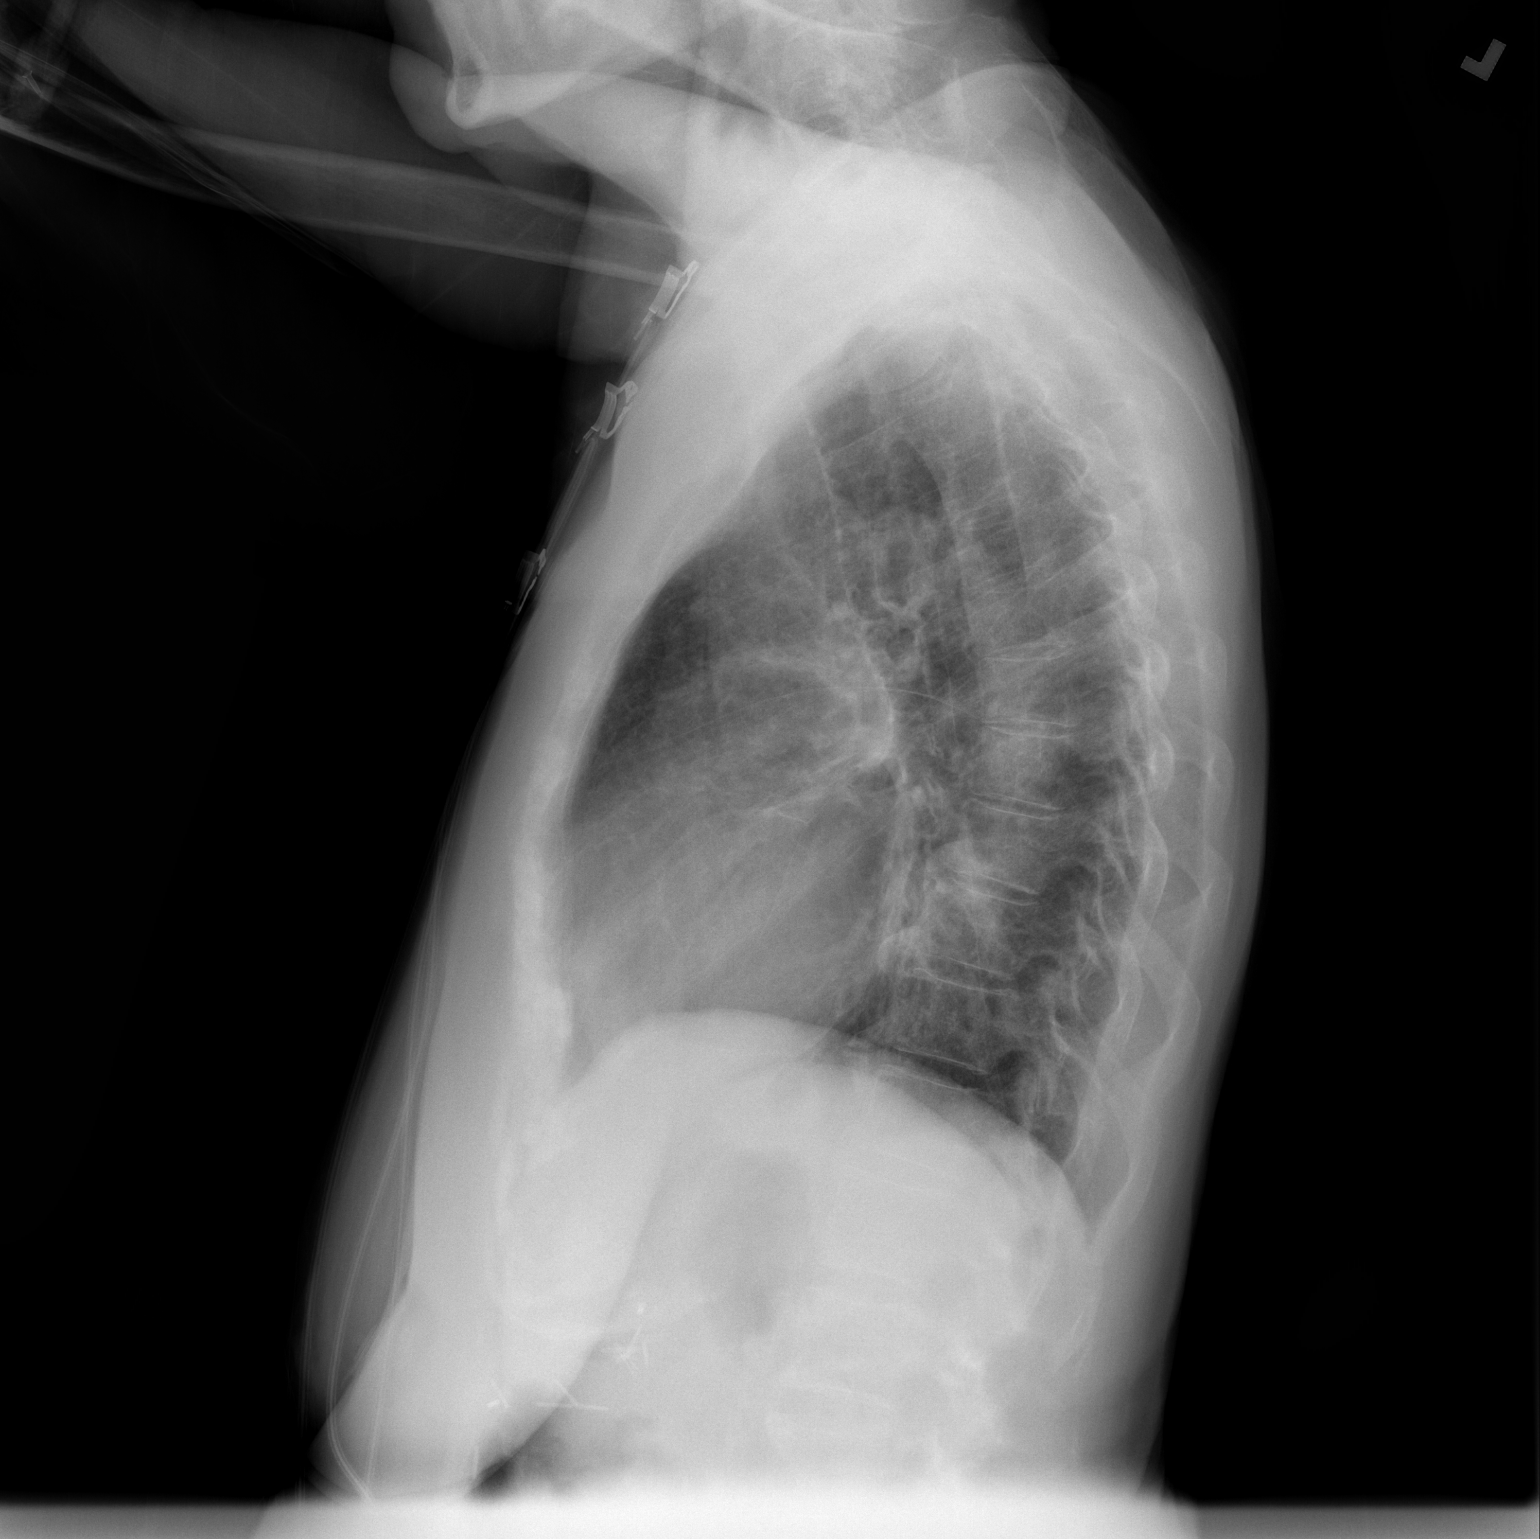

[2 of 2 positions shown; findings below may reference images not displayed]

FINDINGS: The heart size and mediastinal contours are within normal limits.
Calcified nodular density is seen in the right upper lobe. The lungs
are otherwise clear. There is no pleural effusion or pneumothorax.
There are surgical clips in the upper abdomen. The visualized
skeletal structures are unremarkable.
IMPRESSION: No active cardiopulmonary disease.

## 2023-01-17 NOTE — Telephone Encounter (Signed)
        Patient  visited [facility] on 01/10/2023  for hyperglycemia, hip pain.   Telephone encounter attempt :  2nd  No answer unable to leave message.   Derrick Tiegs [facility]  [facility] Population Health Community Resource Care Guide   [email or web address]  ?? [phone]   Website: [email or web address]  [email or web address]

## 2023-01-22 ENCOUNTER — Other Ambulatory Visit: Payer: Self-pay | Admitting: Physician Assistant

## 2023-01-22 DIAGNOSIS — D509 Iron deficiency anemia, unspecified: Secondary | ICD-10-CM

## 2023-01-26 DIAGNOSIS — Z5111 Encounter for antineoplastic chemotherapy: Secondary | ICD-10-CM | POA: Diagnosis not present

## 2023-01-26 DIAGNOSIS — C678 Malignant neoplasm of overlapping sites of bladder: Secondary | ICD-10-CM | POA: Diagnosis not present

## 2023-02-02 DIAGNOSIS — Z5111 Encounter for antineoplastic chemotherapy: Secondary | ICD-10-CM | POA: Diagnosis not present

## 2023-02-02 DIAGNOSIS — C678 Malignant neoplasm of overlapping sites of bladder: Secondary | ICD-10-CM | POA: Diagnosis not present

## 2023-02-03 ENCOUNTER — Ambulatory Visit: Payer: Self-pay

## 2023-02-03 NOTE — Patient Instructions (Signed)
Visit Information  Thank you for taking time to visit with me today. Please don't hesitate to contact me if I can be of assistance to you.   Following are the goals we discussed today:   Goals Addressed             This Visit's Progress    Diabetes Management       Patient Goals/Self Care Activities: -Patient/Caregiver will self-administer medications as prescribed as evidenced by self-report/primary caregiver report  -Patient/Caregiver will attend all scheduled provider appointments as evidenced by clinician review of documented attendance to scheduled appointments and patient/caregiver report -Patient/Caregiver will call provider office for new concerns or questions as evidenced by review of documented incoming telephone call notes and patient report  -check blood sugar at prescribed times -check blood sugar if I feel it is too high or too low -record values and write them down take them to all doctor visits  -Discussed low carbohydrate diet and limiting sweets  Seeing Nutritionist at Endocrinologist.  Appointment with endocrinologist 02/10/23 Patient currently having bladder treatments for 5 weeks.  Last treatment scheduled.   Discussed taking it easy after treatments Blood sugars running in the 200's.    Interventions Today    Flowsheet Row Most Recent Value  Chronic Disease   Chronic disease during today's visit Diabetes, Other  [Bladder Treatment]  General Interventions   General Interventions Discussed/Reviewed General Interventions Reviewed, Doctor Visits  Doctor Visits Discussed/Reviewed Doctor Visits Reviewed  PCP/Specialist Visits Compliance with follow-up visit  [Paitent having bladder treatments last one scheduled]  Exercise Interventions   Exercise Discussed/Reviewed Exercise Reviewed  Education Interventions   Education Provided Provided Education  Provided Verbal Education On Nutrition, Blood Sugar Monitoring, Other  [Patient bladder treatments on as scheduled.   Patient reports doing what the doctor asked such as drinking plenty of water.  Patient scheduled to see Endocrinologist on 02/10/23 per patient as blood sugars have been running high.]  Nutrition Interventions   Nutrition Discussed/Reviewed Decreasing sugar intake, Portion sizes, Nutrition Reviewed  Pharmacy Interventions   Pharmacy Dicussed/Reviewed Medications and their functions             Our next appointment is by telephone on 03/08/23 at 1130  Please call the care guide team at [phone] if you need to cancel or reschedule your appointment.   If you are experiencing a Mental Health or Behavioral Health Crisis or need someone to talk to, please call the Suicide and Crisis Lifeline: 988   Patient verbalizes understanding of instructions and care plan provided today and agrees to view in MyChart. Active MyChart status and patient understanding of how to access instructions and care plan via MyChart confirmed with patient.     The patient has been provided with contact information for the care management team and has been advised to call with any health related questions or concerns.   Bary Leriche, RN, MSN [facility] Care Management Care Management Coordinator Direct Line [phone]

## 2023-02-03 NOTE — Patient Outreach (Signed)
  Care Coordination   Follow Up Visit Note   02/03/2023 Name: [name] MRN: [id number] DOB: 02-07-1935  [name] is a 88 y.o. year old female who sees [name], MD for primary care. I spoke with  [name] by phone today.  What matters to the patients health and wellness today?  Following doctor's advise    Goals Addressed             This Visit's Progress    Diabetes Management       Patient Goals/Self Care Activities: -Patient/Caregiver will self-administer medications as prescribed as evidenced by self-report/primary caregiver report  -Patient/Caregiver will attend all scheduled provider appointments as evidenced by clinician review of documented attendance to scheduled appointments and patient/caregiver report -Patient/Caregiver will call provider office for new concerns or questions as evidenced by review of documented incoming telephone call notes and patient report  -check blood sugar at prescribed times -check blood sugar if I feel it is too high or too low -record values and write them down take them to all doctor visits  -Discussed low carbohydrate diet and limiting sweets  Seeing Nutritionist at Endocrinologist.  Appointment with endocrinologist 02/10/23 Patient currently having bladder treatments for 5 weeks.  Last treatment scheduled.   Discussed taking it easy after treatments Blood sugars running in the 200's.    Interventions Today    Flowsheet Row Most Recent Value  Chronic Disease   Chronic disease during today's visit Diabetes, Other  [Bladder Treatment]  General Interventions   General Interventions Discussed/Reviewed General Interventions Reviewed, Doctor Visits  Doctor Visits Discussed/Reviewed Doctor Visits Reviewed  PCP/Specialist Visits Compliance with follow-up visit  [Paitent having bladder treatments last one scheduled]  Exercise Interventions   Exercise Discussed/Reviewed Exercise Reviewed  Education Interventions   Education  Provided Provided Education  Provided Verbal Education On Nutrition, Blood Sugar Monitoring, Other  [Patient bladder treatments on as scheduled.  Patient reports doing what the doctor asked such as drinking plenty of water.  Patient scheduled to see Endocrinologist on 02/10/23 per patient as blood sugars have been running high.]  Nutrition Interventions   Nutrition Discussed/Reviewed Decreasing sugar intake, Portion sizes, Nutrition Reviewed  Pharmacy Interventions   Pharmacy Dicussed/Reviewed Medications and their functions             SDOH assessments and interventions completed:  Yes     Care Coordination Interventions:  Yes, provided   Follow up plan: Follow up call scheduled for May    Encounter Outcome:  Pt. Visit Completed   Bary Leriche, RN, MSN [facility] Care Management Care Management Coordinator Direct Line [phone]

## 2023-02-06 ENCOUNTER — Other Ambulatory Visit: Payer: Self-pay | Admitting: Internal Medicine

## 2023-02-06 DIAGNOSIS — I480 Paroxysmal atrial fibrillation: Secondary | ICD-10-CM

## 2023-02-06 NOTE — Telephone Encounter (Signed)
Prescription refill request for Eliquis received. Indication: a fib Last office visit: 10/27/22 Scr: 1.96 Age: 88 Weight: 59kg

## 2023-02-09 ENCOUNTER — Ambulatory Visit: Payer: Medicare PPO | Attending: Internal Medicine | Admitting: Internal Medicine

## 2023-02-10 ENCOUNTER — Encounter: Payer: Self-pay | Admitting: Internal Medicine

## 2023-02-11 DIAGNOSIS — R809 Proteinuria, unspecified: Secondary | ICD-10-CM | POA: Diagnosis not present

## 2023-02-11 DIAGNOSIS — Z794 Long term (current) use of insulin: Secondary | ICD-10-CM | POA: Diagnosis not present

## 2023-02-11 DIAGNOSIS — Z79890 Hormone replacement therapy (postmenopausal): Secondary | ICD-10-CM | POA: Diagnosis not present

## 2023-02-11 DIAGNOSIS — E890 Postprocedural hypothyroidism: Secondary | ICD-10-CM | POA: Diagnosis not present

## 2023-02-11 DIAGNOSIS — E1329 Other specified diabetes mellitus with other diabetic kidney complication: Secondary | ICD-10-CM | POA: Diagnosis not present

## 2023-02-11 DIAGNOSIS — E1365 Other specified diabetes mellitus with hyperglycemia: Secondary | ICD-10-CM | POA: Diagnosis not present

## 2023-02-11 DIAGNOSIS — Z7985 Long-term (current) use of injectable non-insulin antidiabetic drugs: Secondary | ICD-10-CM | POA: Diagnosis not present

## 2023-02-17 DIAGNOSIS — C678 Malignant neoplasm of overlapping sites of bladder: Secondary | ICD-10-CM | POA: Diagnosis not present

## 2023-02-17 DIAGNOSIS — Z5111 Encounter for antineoplastic chemotherapy: Secondary | ICD-10-CM | POA: Diagnosis not present

## 2023-02-24 DIAGNOSIS — Z5111 Encounter for antineoplastic chemotherapy: Secondary | ICD-10-CM | POA: Diagnosis not present

## 2023-02-24 DIAGNOSIS — C678 Malignant neoplasm of overlapping sites of bladder: Secondary | ICD-10-CM | POA: Diagnosis not present

## 2023-03-01 ENCOUNTER — Telehealth: Payer: Self-pay | Admitting: Internal Medicine

## 2023-03-01 MED ORDER — AMIODARONE HCL 200 MG PO TABS: 200.0000 mg | ORAL_TABLET | Freq: Every day | ORAL | 0 refills | Status: DC

## 2023-03-01 NOTE — Telephone Encounter (Signed)
Patient came in today and asked to speak to nurse about her Amiodoroan dosage. Patient was told to decrease to 1 tablet per day  from 2 tablets a day while she was in the hospital recently. (Early May) Callback # [phone] or [phone]

## 2023-03-02 MED ORDER — AMIODARONE HCL 200 MG PO TABS: 200.0000 mg | ORAL_TABLET | Freq: Every day | ORAL | 0 refills | Status: DC

## 2023-03-02 NOTE — Telephone Encounter (Signed)
Pt's medication was resent to a different pharmacy, enough medication until appt on 03/11/23. Confirmation received.

## 2023-03-02 NOTE — Addendum Note (Signed)
Addended by: Margaret Pyle D on: 03/02/2023 11:59 AM   Modules accepted: Orders

## 2023-03-02 NOTE — Telephone Encounter (Signed)
Per chart review, last time patient was in a [place] facility was 01/2023. Patient said she was in the hospital in May 2024. Explained timeline of prior ED visits. Explained that the last time her amiodarone dose was changed, per chart review, was July 2023. Explained that at that time, amiodarone was changed to QD. Advised her that I refilled this med yesterday. She scheduled an appointment on 03/11/23 for a follow up.

## 2023-03-08 ENCOUNTER — Ambulatory Visit: Payer: Self-pay

## 2023-03-08 NOTE — Patient Outreach (Signed)
  Care Coordination   03/08/2023 Name: Wyolene Ebarb MRN: [id number] DOB: 08-Apr-1935   Care Coordination Outreach Attempts:  An unsuccessful telephone outreach was attempted today to offer the patient information about available care coordination services.  Follow Up Plan:  Additional outreach attempts will be made to offer the patient care coordination information and services.   Encounter Outcome:  No Answer   Care Coordination Interventions:  No, not indicated    Bary Leriche, RN, MSN [facility] Care Management Care Management Coordinator Direct Line [phone]

## 2023-03-09 ENCOUNTER — Telehealth: Payer: Self-pay

## 2023-03-09 DIAGNOSIS — C678 Malignant neoplasm of overlapping sites of bladder: Secondary | ICD-10-CM | POA: Diagnosis not present

## 2023-03-09 DIAGNOSIS — D090 Carcinoma in situ of bladder: Secondary | ICD-10-CM | POA: Diagnosis not present

## 2023-03-09 NOTE — Patient Instructions (Signed)
Visit Information  Thank you for taking time to visit with me today. Please don't hesitate to contact me if I can be of assistance to you.   Following are the goals we discussed today:   Goals Addressed             This Visit's Progress    Diabetes Management       Patient Goals/Self Care Activities: -Patient/Caregiver will self-administer medications as prescribed as evidenced by self-report/primary caregiver report  -Patient/Caregiver will attend all scheduled provider appointments as evidenced by clinician review of documented attendance to scheduled appointments and patient/caregiver report -Patient/Caregiver will call provider office for new concerns or questions as evidenced by review of documented incoming telephone call notes and patient report  -check blood sugar at prescribed times -check blood sugar if I feel it is too high or too low -record values and write them down take them to all doctor visits  -Discussed low carbohydrate diet and limiting sweets  Patient bladder treatments complete.   Blood sugars running in the 200's.               Our next appointment is by telephone on 04/06/23 at 1130  Please call the care guide team at [phone] if you need to cancel or reschedule your appointment.   If you are experiencing a Mental Health or Behavioral Health Crisis or need someone to talk to, please call the Suicide and Crisis Lifeline: 988   Patient verbalizes understanding of instructions and care plan provided today and agrees to view in MyChart. Active MyChart status and patient understanding of how to access instructions and care plan via MyChart confirmed with patient.     The patient has been provided with contact information for the care management team and has been advised to call with any health related questions or concerns.   Bary Leriche, RN, MSN [facility] Care Management Care Management Coordinator Direct Line [phone]

## 2023-03-09 NOTE — Patient Outreach (Signed)
  Care Coordination   Follow Up Visit Note   03/09/2023 Name: [name] MRN: [id number] DOB: 09-29-1935  [name] is a 88 y.o. year old female who sees [name], MD for primary care. I spoke with  Adalberto Cole by phone today.  What matters to the patients health and wellness today?  Getting stronger    Goals Addressed             This Visit's Progress    Diabetes Management       Patient Goals/Self Care Activities: -Patient/Caregiver will self-administer medications as prescribed as evidenced by self-report/primary caregiver report  -Patient/Caregiver will attend all scheduled provider appointments as evidenced by clinician review of documented attendance to scheduled appointments and patient/caregiver report -Patient/Caregiver will call provider office for new concerns or questions as evidenced by review of documented incoming telephone call notes and patient report  -check blood sugar at prescribed times -check blood sugar if I feel it is too high or too low -record values and write them down take them to all doctor visits  -Discussed low carbohydrate diet and limiting sweets  Patient bladder treatments complete.   Blood sugars running in the 200's.               SDOH assessments and interventions completed:  Yes     Care Coordination Interventions:  Yes, provided   Follow up plan: Follow up call scheduled for June    Encounter Outcome:  Pt. Visit Completed {THN Tip this will not be part of the note when signed-REQUIRED REPORT FIELD DO NOT DELETE (Optional):[id number]  Bary Leriche, RN, MSN [facility] Care Management Care Management Coordinator Direct Line [phone]

## 2023-03-10 NOTE — Progress Notes (Deleted)
Cardiology Clinic Note   Patient Name: [name] Date of Encounter: 03/10/2023  Primary Care Provider:  [name], MD Primary Cardiologist:  Karen Nose, MD  Patient Profile    88 y.o. y/o female with a h/o paroxysmal AF (on Eliquis), CHA2DS2-VASc score 4, DM type II, HTN, HLD, chronic kidney disease stage III last seen by Karen Searing, NP, on 10/27/2022 for preoperative evaluation.  Past Medical History    Past Medical History:  Diagnosis Date   Anemia    Colon polyp    Diabetes mellitus    type 1   Hyperlipidemia    Hypertension    Hypothyroidism    Pneumonia    Poor appetite    Thyroid disease    Past Surgical History:  Procedure Laterality Date   ABDOMINAL HYSTERECTOMY     APPENDECTOMY     BIOPSY  02/07/2022   Procedure: BIOPSY;  Surgeon: Karen Dare, MD;  Location: [facility] ENDOSCOPY;  Service: Gastroenterology;;   BREAST EXCISIONAL BIOPSY Right    CARDIOVASCULAR STRESS TEST  07/15/2006   Normal scan, no ECG changes   COLONOSCOPY WITH PROPOFOL N/A 02/07/2022   Procedure: COLONOSCOPY WITH PROPOFOL;  Surgeon: Karen Dare, MD;  Location: [facility] ENDOSCOPY;  Service: Gastroenterology;  Laterality: N/A;   CYSTOSCOPY WITH BIOPSY Bilateral 11/19/2022   Procedure: CYSTOSCOPY WITH TRANSURETHRAL RESECTION OF BLADDER TUMOR BILATERAL RETROGRADE PYELOGRAM;  Surgeon: Karen Elliot, MD;  Location: [facility] ORS;  Service: Urology;  Laterality: Bilateral;  1 HR FOR CASE   ESOPHAGOGASTRODUODENOSCOPY (EGD) WITH PROPOFOL N/A 02/07/2022   Procedure: ESOPHAGOGASTRODUODENOSCOPY (EGD) WITH PROPOFOL;  Surgeon: Karen Dare, MD;  Location: [facility] ENDOSCOPY;  Service: Gastroenterology;  Laterality: N/A;   TRANSTHORACIC ECHOCARDIOGRAM  01/08/2011   EF 60-65%, moderae mitral regurg, LA mild-moderately dilated,    Allergies  No Known Allergies  History of Present Illness    [name] returns for ongoing assessment and management of paroxysmal AF (on Eliquis), CHA2DS2-VASc score 4,  hypertension and hyperlipidemia.   Home Medications    Current Outpatient Medications  Medication Sig Dispense Refill   amiodarone (PACERONE) 200 MG tablet Take 1 tablet (200 mg total) by mouth daily. 90 tablet 0   apixaban (ELIQUIS) 2.5 MG TABS tablet TAKE 1 TABLET BY MOUTH TWICE A DAY 180 tablet 1   Ascorbic Acid (VITAMIN C) 100 MG tablet Take 100 mg by mouth daily.     calcium carbonate (TUMS - DOSED IN MG ELEMENTAL CALCIUM) 500 MG chewable tablet Chew 1 tablet by mouth daily.     FeFum-FePoly-FA-B Cmp-C-Biot (FOLIVANE-PLUS) CAPS TAKE 1 CAPSULE BY MOUTH EVERY DAY IN THE MORNING 30 capsule 2   furosemide (LASIX) 20 MG tablet Take 20 mg by mouth daily as needed for fluid or edema.     HYDROcodone-acetaminophen (NORCO) 5-325 MG tablet Take 1 tablet by mouth every 4 (four) hours as needed for moderate pain. 6 tablet 0   insulin aspart (NOVOLOG FLEXPEN) 100 UNIT/ML FlexPen Inject 8 Units into the skin 3 (three) times daily with meals. (Patient taking differently: Inject 12 Units into the skin 3 (three) times daily with meals.) 15 mL 0   insulin glargine, 2 Unit Dial, (TOUJEO MAX SOLOSTAR) 300 UNIT/ML Solostar Pen Inject 14 Units into the skin at bedtime. (Patient taking differently: Inject 18 Units into the skin at bedtime.) 15 mL 0   levothyroxine (SYNTHROID) 88 MCG tablet Take 1 tablet (88 mcg total) by mouth daily before breakfast. 30 tablet 2   pantoprazole (PROTONIX)  40 MG tablet Take 1 tablet (40 mg total) by mouth daily at 6 (six) AM. 30 tablet 1   polyethylene glycol (MIRALAX / GLYCOLAX) 17 g packet Take 17 g by mouth daily. (Patient taking differently: Take 17 g by mouth daily as needed for moderate constipation.) 30 each 1   rosuvastatin (CRESTOR) 20 MG tablet Take 20 mg by mouth at bedtime.     senna-docusate (SENOKOT-S) 8.6-50 MG tablet Take 2 tablets by mouth at bedtime. 60 tablet 2   TIADYLT ER 180 MG 24 hr capsule TAKE 1 CAPSULE BY MOUTH EVERY DAY 90 capsule 3   trimethoprim  (TRIMPEX) 100 MG tablet Take 100 mg by mouth daily.     No current facility-administered medications for this visit.     Family History    Family History  Problem Relation Age of Onset   Diabetes Mother    Hypertension Father    Hyperlipidemia Father    Diabetes Sister    Hypertension Sister    Hyperlipidemia Sister    Hypertension Brother    Stroke Sister    Hypertension Sister    Diabetes Sister    Cancer Daughter    Heart failure Child    Breast cancer Neg Hx    She indicated that her mother is deceased. She indicated that her father is deceased. She indicated that both of her sisters are alive. She indicated that both of her brothers are alive. She indicated that the status of her daughter is unknown. She indicated that the status of her child is unknown. She indicated that the status of her neg hx is unknown.  Social History    Social History   Socioeconomic History   Marital status: Widowed    Spouse name: Not on file   Number of children: Not on file   Years of education: Not on file   Highest education level: Not on file  Occupational History   Not on file  Tobacco Use   Smoking status: Former    Types: Cigarettes    Quit date: 05/18/2003    Years since quitting: 19.8   Smokeless tobacco: Never   Tobacco comments:    Never a heavy smoker  I pack lasted 3  days  Vaping Use   Vaping Use: Never used  Substance and Sexual Activity   Alcohol use: No   Drug use: No   Sexual activity: Not Currently  Other Topics Concern   Not on file  Social History Narrative   Not on file   Social Determinants of Health   Financial Resource Strain: Not on file  Food Insecurity: Not on file  Transportation Needs: No Transportation Needs (11/30/2022)   PRAPARE - Transportation    Lack of Transportation (Medical): No    Lack of Transportation (Non-Medical): No  Physical Activity: Not on file  Stress: Not on file  Social Connections: Not on file  Intimate Partner  Violence: Not on file     Review of Systems    General:  No chills, fever, night sweats or weight changes.  Cardiovascular:  No chest pain, dyspnea on exertion, edema, orthopnea, palpitations, paroxysmal nocturnal dyspnea. Dermatological: No rash, lesions/masses Respiratory: No cough, dyspnea Urologic: No hematuria, dysuria Abdominal:   No nausea, vomiting, diarrhea, bright red blood per rectum, melena, or hematemesis Neurologic:  No visual changes, wkns, changes in mental status. All other systems reviewed and are otherwise negative except as noted above.     Physical Exam    VS:  There were no vitals taken for this visit. , BMI There is no height or weight on file to calculate BMI.     GEN: Well nourished, well developed, in no acute distress. HEENT: normal. Neck: Supple, no JVD, carotid bruits, or masses. Cardiac: RRR, no murmurs, rubs, or gallops. No clubbing, cyanosis, edema.  Radials/DP/PT 2+ and equal bilaterally.  Respiratory:  Respirations regular and unlabored, clear to auscultation bilaterally. GI: Soft, nontender, nondistended, BS + x 4. MS: no deformity or atrophy. Skin: warm and dry, no rash. Neuro:  Strength and sensation are intact. Psych: Normal affect.  Accessory Clinical Findings    ECG personally reviewed by me today- *** - No acute changes  Lab Results  Component Value Date   WBC 8.1 01/10/2023   HGB 13.1 01/10/2023   HCT 40.6 01/10/2023   MCV 86.9 01/10/2023   PLT 285 01/10/2023   Lab Results  Component Value Date   CREATININE 1.96 (H) 01/10/2023   BUN 38 (H) 01/10/2023   NA 128 (L) 01/10/2023   K 4.8 01/10/2023   CL 97 (L) 01/10/2023   CO2 17 (L) 01/10/2023   Lab Results  Component Value Date   ALT 34 01/10/2023   AST 45 (H) 01/10/2023   ALKPHOS 98 01/10/2023   BILITOT 1.0 01/10/2023   Lab Results  Component Value Date   CHOL 177 05/04/2022   HDL 57 05/04/2022   LDLCALC 108 (H) 05/04/2022   TRIG 60 05/04/2022   CHOLHDL 3.1  05/04/2022    Lab Results  Component Value Date   HGBA1C 10.5 (H) 11/16/2022    Review of Prior Studies:   Assessment & Plan   1.  ***     {Are you ordering a CV Procedure (e.g. stress test, cath, DCCV, TEE, etc)?   Press F2        :[id number]}   Signed, Bettey Mare. Liborio Nixon, ANP, AACC   03/10/2023 12:14 PM      Office [phone] Fax [phone]  Notice: This dictation was prepared with Dragon dictation along with smaller phrase technology. Any transcriptional errors that result from this process are unintentional and may not be corrected upon review.

## 2023-03-11 ENCOUNTER — Ambulatory Visit: Payer: Medicare PPO | Admitting: Adult Health

## 2023-03-15 ENCOUNTER — Telehealth: Payer: Self-pay | Admitting: Internal Medicine

## 2023-03-15 ENCOUNTER — Other Ambulatory Visit: Payer: Self-pay | Admitting: Urology

## 2023-03-15 NOTE — Telephone Encounter (Signed)
     Pre-operative Risk Assessment    Patient Name: [name]  DOB: [birth year] MRN: [id number]      Request for Surgical Clearance    Procedure:   TUR bladder tumor   Date of Surgery:  Clearance 04/04/23                                 Surgeon:  Dr. Selinda Michaels Group or Practice Name:  [facility]  Phone number:  [phone] Fax number:  [phone]   Type of Clearance Requested:   - Medical  - Pharmacy:  Hold Apixaban (Eliquis) 2 days    Type of Anesthesia:  General    Additional requests/questions:    Signed, Noe Gens   03/15/2023, 9:29 AM

## 2023-03-15 NOTE — Telephone Encounter (Signed)
Please advise holding Eliquis prior to TUR bladder tumor.   Thank you!  DW

## 2023-03-15 NOTE — Telephone Encounter (Signed)
Patient with diagnosis of afib on Eliquis for anticoagulation.    Procedure: TURBT Date of procedure: 04/04/23  CHA2DS2-VASc Score = 5  This indicates a 7.2% annual risk of stroke. The patient's score is based upon: CHF History: 0 HTN History: 1 Diabetes History: 1 Stroke History: 0 Vascular Disease History: 0 Age Score: 2 Gender Score: 1  CrCl 79mL/min Platelet count 285K  Per office protocol, patient can hold Eliquis for 3 days prior to procedure given reduced renal function.    **This guidance is not considered finalized until pre-operative APP has relayed final recommendations.**

## 2023-03-15 NOTE — Telephone Encounter (Signed)
   Name: [name]  DOB: 19-Aug-1935  MRN: [id number]  Primary Cardiologist: Chrystie Nose, MD  Chart reviewed as part of pre-operative protocol coverage. Because of [name]'s past medical history and time since last visit, she will require a follow-up in-office visit in order to better assess preoperative cardiovascular risk.  Patient is already scheduled for office visit on 03/24/2023. I have updated visit notes to reflect need for pre-op evaluation.   Pre-op covering staff:  - Please contact requesting surgeon's office via preferred method (i.e, phone, fax) to inform them of need for appointment prior to surgery.  This message will also be routed to pharmacy pool for input on holding Eliquis as requested below so that this information is available to the clearing provider at time of patient's appointment.   Carlos Levering, NP  03/15/2023, 12:15 PM

## 2023-03-15 NOTE — Progress Notes (Signed)
Sent message, via epic in basket, requesting orders in epic from surgeon.  

## 2023-03-16 NOTE — Progress Notes (Addendum)
Anesthesia Review:  PCP: Irineo Axon  Cardiologist : Hilty clear- 03/15/23- telephone encounter  Next visit on 03/24/23 per pt  Endocrinology- DR Miya Mcknight LOV 02/10/22  Diabetic Educator visit on 03/28/23  Chest x-ray : EKG : 05/03/22  Echo : 09/01/21  Stress test: 2007  Cardiac Cath :  Activity level: can do a flight of stairs without difficutly  Sleep Study/ CPAP : none  Fasting Blood Sugar :      / Checks Blood Sugar -- times a day:   Blood Thinner/ Instructions /Last Dose: ASA / Instructions/ Last Dose :    Eliquis - stop 2 days prior per pt    DM- type 1- Freestyle LIbre  Hgba1c- 03/22/23 - 11.5 - routed to Dr Joaquin Bend with meals- usual doses day before, no bedtime dose  Toujeo- Take 8 units nite before surgery  Trulicity- Last dose on 03/27/23 prior to surgery    PT came to preop alone.  PT states at preop glucose are up and down.  Glucose at preop was 424.  Pt admittted to eating prior to preop appt that she drank a Coke and ate a Snickers bar.  PT states she had a craving for candy bar.  PT states at preop she feels fine. Leticia Clas made aware and pt was instructed to go to ED if she feels bad.  PT voiced understanding.  Preop nurse called and pt has upcoming appt with Diabetes Educator on 03/28/23.  And upcoming appt 05/2023 with DR Ledon Snare.     BMP done 03/22/23 routed to Dr Alvester Morin.    Pt spaces out Pacerone and Tiadlyt in the am.  Placed note on front of chart for Tiadlyt to be given once pt arrives to keep pt on her drug regimen.

## 2023-03-17 DIAGNOSIS — M8580 Other specified disorders of bone density and structure, unspecified site: Secondary | ICD-10-CM | POA: Diagnosis not present

## 2023-03-17 DIAGNOSIS — K219 Gastro-esophageal reflux disease without esophagitis: Secondary | ICD-10-CM | POA: Diagnosis not present

## 2023-03-17 DIAGNOSIS — I700 Atherosclerosis of aorta: Secondary | ICD-10-CM | POA: Diagnosis not present

## 2023-03-17 DIAGNOSIS — Z7901 Long term (current) use of anticoagulants: Secondary | ICD-10-CM | POA: Diagnosis not present

## 2023-03-17 DIAGNOSIS — Z87891 Personal history of nicotine dependence: Secondary | ICD-10-CM | POA: Diagnosis not present

## 2023-03-17 DIAGNOSIS — F324 Major depressive disorder, single episode, in partial remission: Secondary | ICD-10-CM | POA: Diagnosis not present

## 2023-03-17 DIAGNOSIS — E611 Iron deficiency: Secondary | ICD-10-CM | POA: Diagnosis not present

## 2023-03-17 DIAGNOSIS — E785 Hyperlipidemia, unspecified: Secondary | ICD-10-CM | POA: Diagnosis not present

## 2023-03-17 DIAGNOSIS — C679 Malignant neoplasm of bladder, unspecified: Secondary | ICD-10-CM | POA: Diagnosis not present

## 2023-03-17 DIAGNOSIS — E890 Postprocedural hypothyroidism: Secondary | ICD-10-CM | POA: Diagnosis not present

## 2023-03-17 DIAGNOSIS — I129 Hypertensive chronic kidney disease with stage 1 through stage 4 chronic kidney disease, or unspecified chronic kidney disease: Secondary | ICD-10-CM | POA: Diagnosis not present

## 2023-03-17 DIAGNOSIS — K760 Fatty (change of) liver, not elsewhere classified: Secondary | ICD-10-CM | POA: Diagnosis not present

## 2023-03-17 DIAGNOSIS — N3941 Urge incontinence: Secondary | ICD-10-CM | POA: Diagnosis not present

## 2023-03-17 DIAGNOSIS — N181 Chronic kidney disease, stage 1: Secondary | ICD-10-CM | POA: Diagnosis not present

## 2023-03-17 DIAGNOSIS — N390 Urinary tract infection, site not specified: Secondary | ICD-10-CM | POA: Diagnosis not present

## 2023-03-17 DIAGNOSIS — I2510 Atherosclerotic heart disease of native coronary artery without angina pectoris: Secondary | ICD-10-CM | POA: Diagnosis not present

## 2023-03-17 DIAGNOSIS — I4891 Unspecified atrial fibrillation: Secondary | ICD-10-CM | POA: Diagnosis not present

## 2023-03-17 DIAGNOSIS — D6869 Other thrombophilia: Secondary | ICD-10-CM | POA: Diagnosis not present

## 2023-03-17 NOTE — Patient Instructions (Signed)
SURGICAL WAITING ROOM VISITATION  Patients having surgery or a procedure may have no more than 2 support people in the waiting area - these visitors may rotate.    Children under the age of 22 must have an adult with them who is not the patient.  Due to an increase in RSV and influenza rates and associated hospitalizations, children ages 28 and under may not visit patients in [facility] hospitals.  If the patient needs to stay at the hospital during part of their recovery, the visitor guidelines for inpatient rooms apply. Pre-op nurse will coordinate an appropriate time for 1 support person to accompany patient in pre-op.  This support person may not rotate.    Please refer to the [facility] website for the visitor guidelines for Inpatients (after your surgery is over and you are in a regular room).       Your procedure is scheduled on:  04/04/23    Report to [facility] Main Entrance    Report to admitting at  0730 AM   Call this number if you have problems the morning of surgery [phone]   Do not eat food  or drink liquids after midnite.                            If you have questions, please contact your surgeon's office.       Oral Hygiene is also important to reduce your risk of infection.                                    Remember - BRUSH YOUR TEETH THE MORNING OF SURGERY WITH YOUR REGULAR TOOTHPASTE  DENTURES WILL BE REMOVED PRIOR TO SURGERY PLEASE DO NOT APPLY "Poly grip" OR ADHESIVES!!!   Do NOT smoke after Midnight   Take these medicines the morning of surgery with A SIP OF WATER:  pacerone, synthroid, protonix, tiadylt                Novolog with meals-              Toujeo-   DO NOT TAKE ANY ORAL DIABETIC MEDICATIONS DAY OF YOUR SURGERY  Bring CPAP mask and tubing day of surgery.                              You may not have any metal on your body including hair pins, jewelry, and body piercing             Do not wear make-up, lotions,  powders, perfumes/cologne, or deodorant  Do not wear nail polish including gel and S&S, artificial/acrylic nails, or any other type of covering on natural nails including finger and toenails. If you have artificial nails, gel coating, etc. that needs to be removed by a nail salon please have this removed prior to surgery or surgery may need to be canceled/ delayed if the surgeon/ anesthesia feels like they are unable to be safely monitored.   Do not shave  48 hours prior to surgery.               Men may shave face and neck.   Do not bring valuables to the hospital. [facility] IS NOT             RESPONSIBLE   FOR VALUABLES.  Contacts, glasses, dentures or bridgework may not be worn into surgery.   Bring small overnight bag day of surgery.   DO NOT BRING YOUR HOME MEDICATIONS TO THE HOSPITAL. PHARMACY WILL DISPENSE MEDICATIONS LISTED ON YOUR MEDICATION LIST TO YOU DURING YOUR ADMISSION IN THE HOSPITAL!    Patients discharged on the day of surgery will not be allowed to drive home.  Someone NEEDS to stay with you for the first 24 hours after anesthesia.   Special Instructions: Bring a copy of your healthcare power of attorney and living will documents the day of surgery if you haven't scanned them before.              Please read over the following fact sheets you were given: IF YOU HAVE QUESTIONS ABOUT YOUR PRE-OP INSTRUCTIONS PLEASE CALL [phone]   If you received a COVID test during your pre-op visit  it is requested that you wear a mask when out in public, stay away from anyone that may not be feeling well and notify your surgeon if you develop symptoms. If you test positive for Covid or have been in contact with anyone that has tested positive in the last 10 days please notify you surgeon.    [facility] - Preparing for Surgery Before surgery, you can play an important role.  Because skin is not sterile, your skin needs to be as free of germs as possible.  You can reduce the  number of germs on your skin by washing with CHG (chlorahexidine gluconate) soap before surgery.  CHG is an antiseptic cleaner which kills germs and bonds with the skin to continue killing germs even after washing. Please DO NOT use if you have an allergy to CHG or antibacterial soaps.  If your skin becomes reddened/irritated stop using the CHG and inform your nurse when you arrive at Short Stay. Do not shave (including legs and underarms) for at least 48 hours prior to the first CHG shower.  You may shave your face/neck. Please follow these instructions carefully:  1.  Shower with CHG Soap the night before surgery and the  morning of Surgery.  2.  If you choose to wash your hair, wash your hair first as usual with your  normal  shampoo.  3.  After you shampoo, rinse your hair and body thoroughly to remove the  shampoo.                           4.  Use CHG as you would any other liquid soap.  You can apply chg directly  to the skin and wash                       Gently with a scrungie or clean washcloth.  5.  Apply the CHG Soap to your body ONLY FROM THE NECK DOWN.   Do not use on face/ open                           Wound or open sores. Avoid contact with eyes, ears mouth and genitals (private parts).                       Wash face,  Genitals (private parts) with your normal soap.             6.  Wash thoroughly, paying special attention to the area where  your surgery  will be performed.  7.  Thoroughly rinse your body with warm water from the neck down.  8.  DO NOT shower/wash with your normal soap after using and rinsing off  the CHG Soap.                9.  Pat yourself dry with a clean towel.            10.  Wear clean pajamas.            11.  Place clean sheets on your bed the night of your first shower and do not  sleep with pets. Day of Surgery : Do not apply any lotions/deodorants the morning of surgery.  Please wear clean clothes to the hospital/surgery center.  FAILURE TO FOLLOW  THESE INSTRUCTIONS MAY RESULT IN THE CANCELLATION OF YOUR SURGERY PATIENT SIGNATURE_________________________________  NURSE SIGNATURE__________________________________  ________________________________________________________________________

## 2023-03-22 ENCOUNTER — Encounter (HOSPITAL_COMMUNITY): Payer: Self-pay

## 2023-03-22 ENCOUNTER — Other Ambulatory Visit: Payer: Self-pay

## 2023-03-22 ENCOUNTER — Encounter (HOSPITAL_COMMUNITY)
Admission: RE | Admit: 2023-03-22 | Discharge: 2023-03-22 | Disposition: A | Discharge status: Home / Self Care | Payer: Medicare PPO | Source: Ambulatory Visit | Attending: Urology | Admitting: Urology

## 2023-03-22 VITALS — BP 136/67 | HR 67 | Temp 98.1°F | Resp 16 | Ht 62.0 in | Wt 136.0 lb

## 2023-03-22 DIAGNOSIS — Z01812 Encounter for preprocedural laboratory examination: Secondary | ICD-10-CM | POA: Diagnosis not present

## 2023-03-22 DIAGNOSIS — E1022 Type 1 diabetes mellitus with diabetic chronic kidney disease: Secondary | ICD-10-CM | POA: Insufficient documentation

## 2023-03-22 DIAGNOSIS — D303 Benign neoplasm of bladder: Secondary | ICD-10-CM | POA: Insufficient documentation

## 2023-03-22 DIAGNOSIS — N189 Chronic kidney disease, unspecified: Secondary | ICD-10-CM | POA: Insufficient documentation

## 2023-03-22 DIAGNOSIS — I4891 Unspecified atrial fibrillation: Secondary | ICD-10-CM | POA: Diagnosis not present

## 2023-03-22 DIAGNOSIS — Z01818 Encounter for other preprocedural examination: Secondary | ICD-10-CM

## 2023-03-22 DIAGNOSIS — Z87891 Personal history of nicotine dependence: Secondary | ICD-10-CM | POA: Diagnosis not present

## 2023-03-22 DIAGNOSIS — I129 Hypertensive chronic kidney disease with stage 1 through stage 4 chronic kidney disease, or unspecified chronic kidney disease: Secondary | ICD-10-CM | POA: Diagnosis not present

## 2023-03-22 DIAGNOSIS — K219 Gastro-esophageal reflux disease without esophagitis: Secondary | ICD-10-CM | POA: Diagnosis not present

## 2023-03-22 HISTORY — DX: Unspecified osteoarthritis, unspecified site: M19.90

## 2023-03-22 HISTORY — DX: Cardiac murmur, unspecified: R01.1

## 2023-03-22 LAB — BASIC METABOLIC PANEL
Anion gap: 10 (ref 5–15)
BUN: 13 mg/dL (ref 8–23)
CO2: 23 mmol/L (ref 22–32)
Calcium: 8.8 mg/dL — ABNORMAL LOW (ref 8.9–10.3)
Chloride: 100 mmol/L (ref 98–111)
Creatinine, Ser: 1.13 mg/dL — ABNORMAL HIGH (ref 0.44–1.00)
GFR, Estimated: 48 mL/min — ABNORMAL LOW (ref >60)
Glucose, Bld: 382 mg/dL — ABNORMAL HIGH (ref 70–99)
Potassium: 3.5 mmol/L (ref 3.5–5.1)
Sodium: 133 mmol/L — ABNORMAL LOW (ref 135–145)

## 2023-03-22 LAB — CBC
HCT: 37.5 % (ref 36.0–46.0)
Hemoglobin: 11.9 g/dL — ABNORMAL LOW (ref 12.0–15.0)
MCH: 28.4 pg (ref 26.0–34.0)
MCHC: 31.7 g/dL (ref 30.0–36.0)
MCV: 89.5 fL (ref 80.0–100.0)
Platelets: 269 10*3/uL (ref 150–400)
RBC: 4.19 MIL/uL (ref 3.87–5.11)
RDW: 14.5 % (ref 11.5–15.5)
WBC: 7.1 10*3/uL (ref 4.0–10.5)
nRBC: 0 % (ref 0.0–0.2)

## 2023-03-22 LAB — HEMOGLOBIN A1C
Hgb A1c MFr Bld: 11.5 % — ABNORMAL HIGH (ref 4.8–5.6)
Mean Plasma Glucose: 283.35 mg/dL

## 2023-03-22 LAB — GLUCOSE, CAPILLARY: Glucose-Capillary: 427 mg/dL — ABNORMAL HIGH (ref 70–99)

## 2023-03-24 ENCOUNTER — Ambulatory Visit: Payer: Medicare PPO | Attending: Adult Health | Admitting: Student

## 2023-03-24 ENCOUNTER — Encounter: Payer: Self-pay | Admitting: Student

## 2023-03-24 VITALS — BP 138/64 | HR 71 | Ht 62.0 in | Wt 136.4 lb

## 2023-03-24 DIAGNOSIS — Z01818 Encounter for other preprocedural examination: Secondary | ICD-10-CM

## 2023-03-24 DIAGNOSIS — E785 Hyperlipidemia, unspecified: Secondary | ICD-10-CM

## 2023-03-24 DIAGNOSIS — I480 Paroxysmal atrial fibrillation: Secondary | ICD-10-CM | POA: Diagnosis not present

## 2023-03-24 DIAGNOSIS — I10 Essential (primary) hypertension: Secondary | ICD-10-CM | POA: Diagnosis not present

## 2023-03-24 MED ORDER — FUROSEMIDE 20 MG PO TABS: 20.0000 mg | ORAL_TABLET | Freq: Every day | ORAL | 3 refills | Status: DC | PRN

## 2023-03-24 NOTE — Progress Notes (Addendum)
Cardiology Clinic Note   Date: 03/24/2023 ID: Kylee, Ernzen 07-01-1935, MRN [id number]  Primary Cardiologist:  Chrystie Nose, MD  Patient Profile    [name] is a 88 y.o. female who presents to the clinic today for 1 year follow up and pre-op evaluation.      Past medical history significant for: PAF. Echo 09/01/2021: EF 65 to 70%.  Mild LVH.  Mildly reduced RV function.  Mild LAE.  The atrial septum bows right suggesting LA pressure overload.  Aortic valve sclerosis/calcification without stenosis. Hypertension. Hyperlipidemia. Lipid panel 05/04/2022: LDL 108, HDL 57, TG 60, total 177. IDDM. CKD stage III. Anemia.     History of Present Illness    [name] is a longtime patient of Dr. Rennis Golden.  She was first evaluated for A-fib during hospital admission for syncope.  She converted to sinus rhythm on IV amiodarone and was transitioned to p.o. amiodarone.  She continues to be followed by Dr. Rennis Golden for the above outlined history.  Patient was last seen in the office by Edd Fabian, NP on 02/22/2022 for routine follow-up.  She was doing well and no medication changes were made.  Today, patient is here alone. She is doing well from a cardiac standpoint. Patient denies shortness of breath or dyspnea on exertion. No chest pain, pressure, or tightness. Denies orthopnea or PND. Patient reports occasional ankle edema that is at its best in the morning and progresses throughout the day. She manages it with lasix about once a week but ran out.  No palpitations. She is very active working part time (9 hours a week) at [employer], gardening, and performing all light to moderate household duties. She has some hematuria which she was told by urology to expect. No blood in stool or other bleeding concerns.     ROS: All other systems reviewed and are otherwise negative except as noted in History of Present Illness.  Studies Reviewed    ECG personally reviewed by me today:  NSR, prolonged QT, 71 bpm.  No significant changes from 05/03/2023.  Risk Assessment/Calculations     CHA2DS2-VASc Score = 5   This indicates a 7.2% annual risk of stroke. The patient's score is based upon: CHF History: 0 HTN History: 1 Diabetes History: 1 Stroke History: 0 Vascular Disease History: 0 Age Score: 2 Gender Score: 1             Physical Exam    VS:  BP 138/64 (BP Location: Left Arm, Patient Position: Sitting, Cuff Size: Normal)   Pulse 71   Ht 5\' 2"  (1.575 m)   Wt 136 lb 6.4 oz (61.9 kg)   SpO2 94%   BMI 24.95 kg/m  , BMI Body mass index is 24.95 kg/m.  GEN: Well nourished, well developed, in no acute distress. Neck: No JVD or carotid bruits. Cardiac:  RRR. No murmurs. No rubs or gallops.   Respiratory:  Respirations regular and unlabored. Clear to auscultation without rales, wheezing or rhonchi. GI: Soft, nontender, nondistended. Extremities: Radials/DP/PT 2+ and equal bilaterally. No clubbing or cyanosis. Mild nonpitting edema bilateral ankles.   Skin: Warm and dry, no rash. Neuro: Strength intact.  Assessment & Plan   PAF.  Patient denies palpitations. EKG NSR rate 71 bpm. Denies spontaneous bleeding concerns.  Continue amiodarone, diltiazem, Eliquis.  TSH May 2024 was normal at 4.867.  ALT normal at 34 and AST slightly elevated at 45 April 2024. Hypertension. BP today 138/64. Patient denies headaches, dizziness or  vision changes. Continue diltiazem.  Hyperlipidemia.  LDL July 2023 108.  Continue rosuvastatin. Preoperative cardiovascular risk assessment.  Transurethral resection of bladder tumor. According to the RCRI, patient has a 0.9% risk of MACE. Patient reports activity equivalent to >4.0 METS (working part time at a retirement center, gardening, and light/moderate household chores). Based on ACC/AHA guidelines, [name] would be at acceptable risk for the planned procedure without further cardiovascular testing. Per Pharm D: Can hold Eliquis 3  days prior to procedure given reduced renal function.  Disposition: Renew Lasix. Return in 1 year or sooner as needed.          Signed, Etta Grandchild. Birgit Nowling, DNP, NP-C

## 2023-03-24 NOTE — Patient Instructions (Signed)
Medication Instructions:  Your physician recommends that you continue on your current medications as directed. Please refer to the Current Medication list given to you today.  *If you need a refill on your cardiac medications before your next appointment, please call your pharmacy*   Lab Work: NONE If you have labs (blood work) drawn today and your tests are completely normal, you will receive your results only by: MyChart Message (if you have MyChart) OR A paper copy in the mail If you have any lab test that is abnormal or we need to change your treatment, we will call you to review the results.   Testing/Procedures: NONE   Follow-Up: At [facility], you and your health needs are our priority.  As part of our continuing mission to provide you with exceptional heart care, we have created designated Provider Care Teams.  These Care Teams include your primary Cardiologist (physician) and Advanced Practice Providers (APPs -  Physician Assistants and Nurse Practitioners) who all work together to provide you with the care you need, when you need it.  We recommend signing up for the patient portal called "MyChart".  Sign up information is provided on this After Visit Summary.  MyChart is used to connect with patients for Virtual Visits (Telemedicine).  Patients are able to view lab/test results, encounter notes, upcoming appointments, etc.  Non-urgent messages can be sent to your provider as well.   To learn more about what you can do with MyChart, go to [email or web address].    Your next appointment:   1 year(s)  Provider:   Kenneth C Hilty, MD    

## 2023-03-24 NOTE — [facility] Notes (Signed)
Case: [id number] Date/Time: 04/04/23 0915   Procedure: TRANSURETHRAL RESECTION OF BLADDER TUMOR (TURBT) - 1 HR FOR CASE   Anesthesia type: General   Pre-op diagnosis: BLADDER TUMOR   [facility] ROOM 03 / [facility]   Surgeons: Crista Elliot, MD       DISCUSSION: [name] is an 88 yo female who presents to PAT prior to surgery listed above. Patient is a former smoker with hx of A.fib on Eliquis, HTN, type 1 DM, CKD, thyroid disease, GERD, hx of heart murmur.  No prior anesthesia complications.  Patient follows with Cardiology for hx of A.fib. She takes Amiodirone, Eliquis, Diltiazem.  Last OV was 03/24/23 where she had pre-operative risk assessment and clearance. Noted to be doing well from a cardiac standpoint and advised to f/u in 1 year:  "Preoperative cardiovascular risk assessment.  Transurethral resection of bladder tumor. According to the RCRI, patient has a 0.9% risk of MACE. Patient reports activity equivalent to >4.0 METS (working part time at a retirement center, gardening, and light/moderate household chores). Based on ACC/AHA guidelines, Aara Klehr would be at acceptable risk for the planned procedure without further cardiovascular testing. Per Pharm D: Can hold Eliquis 3 days prior to procedure given reduced renal function."  Patient follows with PCP for her other chronic medical issues and follows with a diabetes educator. BP has been controlled. She has a significantly elevated HgA1c (11.9) on screening PAT labs but this is consistent with prior values as well and she had a similar surgery in February when her A1c was 10.5. Results have been routed to the surgeon's office and PCP. Will evaluate Glucose DOS.   VS: BP 136/67   Pulse 67   Temp 36.7 C (Oral)   Resp 16   Ht 5\' 2"  (1.575 m)   Wt 61.7 kg   SpO2 99%   BMI 24.87 kg/m   PROVIDERS: [name], MD Cardiology: Zoila Shutter, MD  LABS:  HgA1c elevated. Results routed to PCP and surgeon. Obtain Glucose  DOS (all labs ordered are listed, but only abnormal results are displayed)  Labs Reviewed  CBC - Abnormal; Notable for the following components:      Result Value   Hemoglobin 11.9 (*)    All other components within normal limits  HEMOGLOBIN A1C - Abnormal; Notable for the following components:   Hgb A1c MFr Bld 11.5 (*)    All other components within normal limits  BASIC METABOLIC PANEL - Abnormal; Notable for the following components:   Sodium 133 (*)    Glucose, Bld 382 (*)    Creatinine, Ser 1.13 (*)    Calcium 8.8 (*)    GFR, Estimated 48 (*)    All other components within normal limits  GLUCOSE, CAPILLARY - Abnormal; Notable for the following components:   Glucose-Capillary 427 (*)    All other components within normal limits     IMAGES:  CT Renal 01/10/23:  IMPRESSION: 1. No CT evidence for acute intra-abdominal or pelvic abnormality. Negative for hydronephrosis 2. Slightly thick-walled appearance of the urinary bladder on the left side with mild perivesical stranding, this could be due to infection, focal inflammatory process, or a mass. Small focus of gas in the bladder presumably related to recent history of biopsy 3. Focal hypodensity at the mid pancreas, indeterminate for cystic lesion versus ductal dilatation. When the patient is clinically stable and able to follow directions and hold their breath (preferably as an outpatient) further evaluation with dedicated abdominal  MRI should be considered. 4. Diverticular disease of the left colon without acute inflammatory process. 5. Aortic atherosclerosis.     EKG 03/24/23  NSR, rate 71 Prolonged QT   CV:  Echo 09/01/21:  IMPRESSIONS     1. Left ventricular ejection fraction, by estimation, is 65 to 70%. The  left ventricle has normal function. The left ventricle has no regional  wall motion abnormalities. There is mild left ventricular hypertrophy.  Left ventricular diastolic parameters  are  indeterminate.   2. Right ventricular systolic function is mildly reduced. The right  ventricular size is normal. There is normal pulmonary artery systolic  pressure. The estimated right ventricular systolic pressure is 31.7 mmHg.   3. Left atrial size was mildly dilated. The atrial septum bows right  suggesting LA pressure overload.   4. The mitral valve is normal in structure. Trivial mitral valve  regurgitation. No evidence of mitral stenosis.   5. The aortic valve is tricuspid. Aortic valve regurgitation is not  visualized. Aortic valve sclerosis/calcification is present, without any  evidence of aortic stenosis.   6. The inferior vena cava is normal in size with greater than 50%  respiratory variability, suggesting right atrial pressure of 3 mmHg.   7. The patient was in atrial fibrillation.   Past Medical History:  Diagnosis Date   Anemia    Arthritis    Colon polyp    Diabetes mellitus    type 1   Heart murmur    Hyperlipidemia    Hypertension    Hypothyroidism    Pneumonia    Poor appetite    Thyroid disease     Past Surgical History:  Procedure Laterality Date   ABDOMINAL HYSTERECTOMY     APPENDECTOMY     BIOPSY  02/07/2022   Procedure: BIOPSY;  Surgeon: Meryl Dare, MD;  Location: [facility] ENDOSCOPY;  Service: Gastroenterology;;   BREAST EXCISIONAL BIOPSY Right    CARDIOVASCULAR STRESS TEST  07/15/2006   Normal scan, no ECG changes   COLONOSCOPY WITH PROPOFOL N/A 02/07/2022   Procedure: COLONOSCOPY WITH PROPOFOL;  Surgeon: Meryl Dare, MD;  Location: [facility] ENDOSCOPY;  Service: Gastroenterology;  Laterality: N/A;   CYSTOSCOPY WITH BIOPSY Bilateral 11/19/2022   Procedure: CYSTOSCOPY WITH TRANSURETHRAL RESECTION OF BLADDER TUMOR BILATERAL RETROGRADE PYELOGRAM;  Surgeon: Crista Elliot, MD;  Location[facility];  Service: Urology;  Laterality: Bilateral;  1 HR FOR CASE   ESOPHAGOGASTRODUODENOSCOPY (EGD) WITH PROPOFOL N/A 02/07/2022   Procedure:  ESOPHAGOGASTRODUODENOSCOPY (EGD) WITH PROPOFOL;  Surgeon: Meryl Dare, MD;  Location: [facility] ENDOSCOPY;  Service: Gastroenterology;  Laterality: N/A;   TRANSTHORACIC ECHOCARDIOGRAM  01/08/2011   EF 60-65%, moderae mitral regurg, LA mild-moderately dilated,    MEDICATIONS:  amiodarone (PACERONE) 200 MG tablet   apixaban (ELIQUIS) 2.5 MG TABS tablet   Ascorbic Acid (VITAMIN C) 100 MG tablet   calcium carbonate (TUMS - DOSED IN MG ELEMENTAL CALCIUM) 500 MG chewable tablet   FeFum-FePoly-FA-B Cmp-C-Biot (FOLIVANE-PLUS) CAPS   furosemide (LASIX) 20 MG tablet   HYDROcodone-acetaminophen (NORCO) 5-325 MG tablet   insulin aspart (NOVOLOG FLEXPEN) 100 UNIT/ML FlexPen   insulin glargine, 2 Unit Dial, (TOUJEO MAX SOLOSTAR) 300 UNIT/ML Solostar Pen   KLOR-CON M20 20 MEQ tablet   levothyroxine (SYNTHROID) 88 MCG tablet   mirtazapine (REMERON) 15 MG tablet   pantoprazole (PROTONIX) 40 MG tablet   polyethylene glycol (MIRALAX / GLYCOLAX) 17 g packet   rosuvastatin (CRESTOR) 40 MG tablet   senna-docusate (SENOKOT-S) 8.6-50 MG tablet  TIADYLT ER 180 MG 24 hr capsule   trimethoprim (TRIMPEX) 100 MG tablet   TRULICITY 1.5 MG/0.5ML SOPN   No current facility-administered medications for this encounter.   Marcille Blanco [facility] Short Stay/Anesthesiology [facility] Phone [phone] 03/29/2023 8:43 AM

## 2023-03-27 DIAGNOSIS — E1065 Type 1 diabetes mellitus with hyperglycemia: Secondary | ICD-10-CM | POA: Diagnosis not present

## 2023-03-28 DIAGNOSIS — E139 Other specified diabetes mellitus without complications: Secondary | ICD-10-CM | POA: Diagnosis not present

## 2023-03-29 ENCOUNTER — Encounter (HOSPITAL_COMMUNITY): Payer: Self-pay

## 2023-03-29 NOTE — Anesthesia Preprocedure Evaluation (Addendum)
Anesthesia Evaluation  Patient identified by MRN, date of birth, ID band Patient awake    Reviewed: Allergy & Precautions, NPO status , Patient's Chart, lab work & pertinent test results  Airway Mallampati: II  TM Distance: >3 FB Neck ROM: Full    Dental no notable dental hx. (+) Teeth Intact, Dental Advisory Given   Pulmonary former smoker   Pulmonary exam normal breath sounds clear to auscultation       Cardiovascular hypertension, Normal cardiovascular exam+ dysrhythmias Atrial Fibrillation  Rhythm:Regular Rate:Normal  08/2021 Echo  1. Left ventricular ejection fraction, by estimation, is 65 to 70%. The  left ventricle has normal function. The left ventricle has no regional  wall motion abnormalities. There is mild left ventricular hypertrophy.  Left ventricular diastolic parameters  are indeterminate.    On Eliquis for Afib   Neuro/Psych negative neurological ROS  negative psych ROS   GI/Hepatic ,GERD  Medicated and Controlled,,  Endo/Other  diabetesHypothyroidism    Renal/GU Renal InsufficiencyRenal diseaseLab Results      Component                Value               Date                      CREATININE               1.13 (H)            03/22/2023                 NA                       133 (L)             03/22/2023                K                        3.5                 03/22/2023                    Musculoskeletal  (+) Arthritis ,    Abdominal   Peds  Hematology Lab Results      Component                Value               Date                           HGB                      11.9 (L)            03/22/2023                HCT                      37.5                03/22/2023                 PLT                      269  03/22/2023              Anesthesia Other Findings NKDA  Reproductive/Obstetrics                             Anesthesia  Physical Anesthesia Plan  ASA: 3  Anesthesia Plan: General   Post-op Pain Management: Ofirmev IV (intra-op)*   Induction: Intravenous  PONV Risk Score and Plan: Treatment may vary due to age or medical condition and Ondansetron  Airway Management Planned: LMA  Additional Equipment: None  Intra-op Plan:   Post-operative Plan:   Informed Consent: I have reviewed the patients History and Physical, chart, labs and discussed the procedure including the risks, benefits and alternatives for the proposed anesthesia with the patient or authorized representative who has indicated his/her understanding and acceptance.     Dental advisory given  Plan Discussed with:   Anesthesia Plan Comments: (See PAT note from 6/11 by K Gekas PA-C  Check trulicity dose )        Anesthesia Quick Evaluation

## 2023-03-30 DIAGNOSIS — C678 Malignant neoplasm of overlapping sites of bladder: Secondary | ICD-10-CM | POA: Diagnosis not present

## 2023-03-30 DIAGNOSIS — R3121 Asymptomatic microscopic hematuria: Secondary | ICD-10-CM | POA: Diagnosis not present

## 2023-04-04 ENCOUNTER — Ambulatory Visit (HOSPITAL_BASED_OUTPATIENT_CLINIC_OR_DEPARTMENT_OTHER): Payer: Medicare PPO | Admitting: Medical

## 2023-04-04 ENCOUNTER — Ambulatory Visit (HOSPITAL_COMMUNITY): Payer: Medicare PPO | Admitting: Physician Assistant

## 2023-04-04 ENCOUNTER — Ambulatory Visit (HOSPITAL_COMMUNITY)
Admission: RE | Admit: 2023-04-04 | Discharge: 2023-04-04 | Disposition: A | Discharge status: Home / Self Care | Payer: Medicare PPO | Source: Ambulatory Visit | Attending: Urology | Admitting: Urology

## 2023-04-04 ENCOUNTER — Encounter (HOSPITAL_COMMUNITY): Payer: Self-pay | Admitting: Urology

## 2023-04-04 ENCOUNTER — Encounter (HOSPITAL_COMMUNITY)
Admission: RE | Disposition: A | Discharge status: Home / Self Care | Payer: Self-pay | Source: Ambulatory Visit | Attending: Urology

## 2023-04-04 DIAGNOSIS — Z86008 Personal history of in-situ neoplasm of other site: Secondary | ICD-10-CM | POA: Insufficient documentation

## 2023-04-04 DIAGNOSIS — I4891 Unspecified atrial fibrillation: Secondary | ICD-10-CM | POA: Insufficient documentation

## 2023-04-04 DIAGNOSIS — Z87891 Personal history of nicotine dependence: Secondary | ICD-10-CM | POA: Diagnosis not present

## 2023-04-04 DIAGNOSIS — I10 Essential (primary) hypertension: Secondary | ICD-10-CM | POA: Diagnosis not present

## 2023-04-04 DIAGNOSIS — K219 Gastro-esophageal reflux disease without esophagitis: Secondary | ICD-10-CM | POA: Insufficient documentation

## 2023-04-04 DIAGNOSIS — D303 Benign neoplasm of bladder: Secondary | ICD-10-CM

## 2023-04-04 DIAGNOSIS — E119 Type 2 diabetes mellitus without complications: Secondary | ICD-10-CM | POA: Insufficient documentation

## 2023-04-04 DIAGNOSIS — N3000 Acute cystitis without hematuria: Secondary | ICD-10-CM | POA: Diagnosis not present

## 2023-04-04 DIAGNOSIS — Z794 Long term (current) use of insulin: Secondary | ICD-10-CM | POA: Insufficient documentation

## 2023-04-04 DIAGNOSIS — Z01818 Encounter for other preprocedural examination: Secondary | ICD-10-CM

## 2023-04-04 DIAGNOSIS — E039 Hypothyroidism, unspecified: Secondary | ICD-10-CM | POA: Insufficient documentation

## 2023-04-04 DIAGNOSIS — I129 Hypertensive chronic kidney disease with stage 1 through stage 4 chronic kidney disease, or unspecified chronic kidney disease: Secondary | ICD-10-CM | POA: Diagnosis not present

## 2023-04-04 DIAGNOSIS — N1832 Chronic kidney disease, stage 3b: Secondary | ICD-10-CM | POA: Diagnosis not present

## 2023-04-04 DIAGNOSIS — N3281 Overactive bladder: Secondary | ICD-10-CM | POA: Insufficient documentation

## 2023-04-04 DIAGNOSIS — N3091 Cystitis, unspecified with hematuria: Secondary | ICD-10-CM | POA: Diagnosis not present

## 2023-04-04 DIAGNOSIS — E139 Other specified diabetes mellitus without complications: Secondary | ICD-10-CM

## 2023-04-04 DIAGNOSIS — D494 Neoplasm of unspecified behavior of bladder: Secondary | ICD-10-CM | POA: Diagnosis not present

## 2023-04-04 DIAGNOSIS — Z91128 Patient's intentional underdosing of medication regimen for other reason: Secondary | ICD-10-CM | POA: Diagnosis not present

## 2023-04-04 DIAGNOSIS — Z7901 Long term (current) use of anticoagulants: Secondary | ICD-10-CM | POA: Insufficient documentation

## 2023-04-04 DIAGNOSIS — T378X6A Underdosing of other specified systemic anti-infectives and antiparasitics, initial encounter: Secondary | ICD-10-CM | POA: Diagnosis not present

## 2023-04-04 DIAGNOSIS — E1122 Type 2 diabetes mellitus with diabetic chronic kidney disease: Secondary | ICD-10-CM | POA: Diagnosis not present

## 2023-04-04 HISTORY — PX: TRANSURETHRAL RESECTION OF BLADDER TUMOR: SHX2575

## 2023-04-04 LAB — GLUCOSE, CAPILLARY
Glucose-Capillary: 254 mg/dL — ABNORMAL HIGH (ref 70–99)
Glucose-Capillary: 236 mg/dL — ABNORMAL HIGH (ref 70–99)
Glucose-Capillary: 201 mg/dL — ABNORMAL HIGH (ref 70–99)

## 2023-04-04 SURGERY — TURBT (TRANSURETHRAL RESECTION OF BLADDER TUMOR)
Anesthesia: General

## 2023-04-04 MED ORDER — FENTANYL CITRATE (PF) 100 MCG/2ML IJ SOLN
INTRAMUSCULAR | Status: DC | PRN
  Administered 2023-04-04 (×2): 25 ug via INTRAVENOUS

## 2023-04-04 MED ORDER — INSULIN ASPART 100 UNIT/ML IJ SOLN
INTRAMUSCULAR | Status: AC
  Filled 2023-04-04: qty 1

## 2023-04-04 MED ORDER — ONDANSETRON HCL 4 MG/2ML IJ SOLN
INTRAMUSCULAR | Status: AC
  Filled 2023-04-04: qty 2

## 2023-04-04 MED ORDER — ORAL CARE MOUTH RINSE: 15.0000 mL | Freq: Once | OROMUCOSAL | Status: AC

## 2023-04-04 MED ORDER — 0.9 % SODIUM CHLORIDE (POUR BTL) OPTIME
TOPICAL | Status: DC | PRN
  Administered 2023-04-04: 1000 mL

## 2023-04-04 MED ORDER — FENTANYL CITRATE PF 50 MCG/ML IJ SOSY
25.0000 ug | PREFILLED_SYRINGE | INTRAMUSCULAR | Status: DC | PRN
  Administered 2023-04-04: 50 ug via INTRAVENOUS

## 2023-04-04 MED ORDER — ACETAMINOPHEN 10 MG/ML IV SOLN
INTRAVENOUS | Status: AC
  Filled 2023-04-04: qty 100

## 2023-04-04 MED ORDER — INSULIN ASPART 100 UNIT/ML IJ SOLN
3.0000 [IU] | Freq: Once | INTRAMUSCULAR | Status: AC
  Administered 2023-04-04: 3 [IU] via SUBCUTANEOUS

## 2023-04-04 MED ORDER — LACTATED RINGERS IV SOLN: INTRAVENOUS | Status: DC

## 2023-04-04 MED ORDER — LIDOCAINE 2% (20 MG/ML) 5 ML SYRINGE
INTRAMUSCULAR | Status: DC | PRN
  Administered 2023-04-04: 80 mg via INTRAVENOUS

## 2023-04-04 MED ORDER — ROCURONIUM BROMIDE 10 MG/ML (PF) SYRINGE
PREFILLED_SYRINGE | INTRAVENOUS | Status: DC | PRN
  Administered 2023-04-04: 40 mg via INTRAVENOUS

## 2023-04-04 MED ORDER — CHLORHEXIDINE GLUCONATE 0.12 % MT SOLN
15.0000 mL | Freq: Once | OROMUCOSAL | Status: AC
  Administered 2023-04-04: 15 mL via OROMUCOSAL

## 2023-04-04 MED ORDER — CEFAZOLIN SODIUM-DEXTROSE 2-4 GM/100ML-% IV SOLN
2.0000 g | INTRAVENOUS | Status: AC
  Administered 2023-04-04: 2 g via INTRAVENOUS
  Filled 2023-04-04: qty 100

## 2023-04-04 MED ORDER — LABETALOL HCL 5 MG/ML IV SOLN
INTRAVENOUS | Status: AC
  Filled 2023-04-04: qty 4

## 2023-04-04 MED ORDER — DEXAMETHASONE SODIUM PHOSPHATE 10 MG/ML IJ SOLN
INTRAMUSCULAR | Status: AC
  Filled 2023-04-04: qty 1

## 2023-04-04 MED ORDER — INSULIN ASPART 100 UNIT/ML IJ SOLN
0.0000 [IU] | INTRAMUSCULAR | Status: DC | PRN
  Administered 2023-04-04: 3 [IU] via SUBCUTANEOUS
  Filled 2023-04-04: qty 1

## 2023-04-04 MED ORDER — SODIUM CHLORIDE 0.9 % IR SOLN
Status: DC | PRN
  Administered 2023-04-04: 6000 mL via INTRAVESICAL

## 2023-04-04 MED ORDER — PROPOFOL 10 MG/ML IV BOLUS
INTRAVENOUS | Status: AC
  Filled 2023-04-04: qty 20

## 2023-04-04 MED ORDER — ONDANSETRON HCL 4 MG/2ML IJ SOLN: 4.0000 mg | Freq: Once | INTRAMUSCULAR | Status: DC | PRN

## 2023-04-04 MED ORDER — FENTANYL CITRATE PF 50 MCG/ML IJ SOSY
PREFILLED_SYRINGE | INTRAMUSCULAR | Status: AC
  Filled 2023-04-04: qty 2

## 2023-04-04 MED ORDER — SUGAMMADEX SODIUM 200 MG/2ML IV SOLN
INTRAVENOUS | Status: DC | PRN
  Administered 2023-04-04: 250 mg via INTRAVENOUS

## 2023-04-04 MED ORDER — LABETALOL HCL 5 MG/ML IV SOLN
INTRAVENOUS | Status: DC | PRN
  Administered 2023-04-04: 5 mg via INTRAVENOUS

## 2023-04-04 MED ORDER — FENTANYL CITRATE (PF) 100 MCG/2ML IJ SOLN
INTRAMUSCULAR | Status: AC
  Filled 2023-04-04: qty 2

## 2023-04-04 MED ORDER — ACETAMINOPHEN 10 MG/ML IV SOLN
1000.0000 mg | Freq: Once | INTRAVENOUS | Status: DC | PRN
  Administered 2023-04-04: 1000 mg via INTRAVENOUS

## 2023-04-04 MED ORDER — EPHEDRINE 5 MG/ML INJ
INTRAVENOUS | Status: AC
  Filled 2023-04-04: qty 15

## 2023-04-04 MED ORDER — LIDOCAINE HCL (PF) 2 % IJ SOLN
INTRAMUSCULAR | Status: AC
  Filled 2023-04-04: qty 30

## 2023-04-04 MED ORDER — DEXAMETHASONE SODIUM PHOSPHATE 10 MG/ML IJ SOLN
INTRAMUSCULAR | Status: DC | PRN
  Administered 2023-04-04: 4 mg via INTRAVENOUS

## 2023-04-04 MED ORDER — PHENYLEPHRINE 80 MCG/ML (10ML) SYRINGE FOR IV PUSH (FOR BLOOD PRESSURE SUPPORT)
PREFILLED_SYRINGE | INTRAVENOUS | Status: DC | PRN
  Administered 2023-04-04: 80 ug via INTRAVENOUS

## 2023-04-04 MED ORDER — PROPOFOL 10 MG/ML IV BOLUS
INTRAVENOUS | Status: DC | PRN
  Administered 2023-04-04: 120 mg via INTRAVENOUS

## 2023-04-04 SURGICAL SUPPLY — 18 items
BAG DRN RND TRDRP ANRFLXCHMBR (UROLOGICAL SUPPLIES) ×1
BAG URINE DRAIN 2000ML AR STRL (UROLOGICAL SUPPLIES) IMPLANT
BAG URO CATCHER STRL LF (MISCELLANEOUS) ×2 IMPLANT
CATH FOLEY 2WAY SLVR 5CC 18FR (CATHETERS) IMPLANT
DRAPE FOOT SWITCH (DRAPES) ×2 IMPLANT
ELECT REM PT RETURN 15FT ADLT (MISCELLANEOUS) ×2 IMPLANT
GLOVE BIO SURGEON STRL SZ7.5 (GLOVE) ×2 IMPLANT
GOWN STRL REUS W/ TWL XL LVL3 (GOWN DISPOSABLE) ×2 IMPLANT
GOWN STRL REUS W/TWL XL LVL3 (GOWN DISPOSABLE) ×1
KIT TURNOVER KIT A (KITS) IMPLANT
LOOP CUT BIPOLAR 24F LRG (ELECTROSURGICAL) IMPLANT
MANIFOLD NEPTUNE II (INSTRUMENTS) ×2 IMPLANT
PACK CYSTO (CUSTOM PROCEDURE TRAY) ×2 IMPLANT
PLUG CATH AND CAP STRL 200 (CATHETERS) IMPLANT
SYR TOOMEY IRRIG 70ML (MISCELLANEOUS)
SYRINGE TOOMEY IRRIG 70ML (MISCELLANEOUS) IMPLANT
TUBING CONNECTING 10 (TUBING) ×2 IMPLANT
TUBING UROLOGY SET (TUBING) ×2 IMPLANT

## 2023-04-04 NOTE — Op Note (Signed)
Operative Note  Preoperative diagnosis:  1.  Bladder tumor  Postoperative diagnosis: 1.  Bladder tumor--medium  Procedure(s): 1.  Transurethral resection of bladder tumor- medium  Surgeon: Modena Slater, MD  Assistants: None  Anesthesia: General  Complications: None immediate  EBL: Minimal  Specimens: 1.  Bladder tumor  Drains/Catheters: 1.  None  Intraoperative findings: 1 normal urethra 2.  Bladder mucosa with scattered areas of erythema.  There was a collection erythema on the posterior bladder wall towards the dome that was about 2 cm that was resected and fulgurated.  There was another 3 cm area on the right lateral wall that was resected and fulgurated.  There was another area that was also 3 to 4 cm that was resected and fulgurated on the left.  Indication: 88 year old female with history of CIS with bladder erythema on surveillance cystoscopy presents for biopsy/resection.  Description of procedure:  The patient was identified and consent was obtained.  The patient was taken to the operating room and placed in the supine position.  The patient was placed under general anesthesia.  Perioperative antibiotics were administered.  The patient was placed in dorsal lithotomy.  Patient was prepped and draped in a standard sterile fashion and a timeout was performed.  A 26 French resectoscope with visual obturator in place was advanced into the urethra and into the bladder.  Complete cystoscopy was performed with findings noted above.  A bipolar loop was then used to resect the areas of interest as noted above.  Resection beds and surrounding areas were fulgurated.  Resection was away from ureteral orifices.  I collected the bladder specimen.  I reinspected bladder mucosa and there was no active bleeding.  There is no evidence of perforation.  I withdrew the scope and this concluded the operation.  Patient tolerated the procedure well was stable postoperatively.  Plan: Follow-up in  1 week for pathology review

## 2023-04-04 NOTE — Anesthesia Procedure Notes (Signed)
Procedure Name: Intubation Date/Time: 04/04/2023 9:01 AM  Performed by: Sindy Guadeloupe, CRNAPre-anesthesia Checklist: Patient identified, Emergency Drugs available, Suction available, Patient being monitored and Timeout performed Patient Re-evaluated:Patient Re-evaluated prior to induction Oxygen Delivery Method: Circle system utilized Preoxygenation: Pre-oxygenation with 100% oxygen Induction Type: IV induction Ventilation: Mask ventilation without difficulty Laryngoscope Size: Mac and 4 Grade View: Grade I Tube type: Oral Tube size: 7.0 mm Number of attempts: 1 Airway Equipment and Method: Stylet Placement Confirmation: ETT inserted through vocal cords under direct vision, positive ETCO2 and breath sounds checked- equal and bilateral Secured at: 21 cm Tube secured with: Tape Dental Injury: Teeth and Oropharynx as per pre-operative assessment

## 2023-04-04 NOTE — Discharge Instructions (Signed)

## 2023-04-04 NOTE — Transfer of Care (Signed)
Immediate Anesthesia Transfer of Care Note  Patient: [name]  Procedure(s) Performed: TRANSURETHRAL RESECTION OF BLADDER TUMOR (TURBT)  Patient Location: PACU  Anesthesia Type:General  Level of Consciousness: awake, alert , and patient cooperative  Airway & Oxygen Therapy: Patient Spontanous Breathing and Patient connected to face mask oxygen  Post-op Assessment: Report given to RN and Post -op Vital signs reviewed and stable  Post vital signs: Reviewed and stable  Last Vitals:  Vitals Value Taken Time  BP 137/79   Temp    Pulse 68 04/04/23 0939  Resp 11 04/04/23 0939  SpO2 100 % 04/04/23 0939  Vitals shown include unvalidated device data.  Last Pain:  Vitals:   04/04/23 0816  TempSrc: Oral         Complications: No notable events documented.

## 2023-04-04 NOTE — H&P (Addendum)
CC/HPI: I was consulted to assist the patient's microscopic hematuria. She smoked 30 years ago. She is on Eliquis but no aspirin   She has urge incontinence and sometimes wears 2 or less pads a day. No bedwetting or stress incontinence. She voids every 2 hours gets up 5 or 6 times a night with a good flow   She is an insulin-dependent diabetic. She has had a hysterectomy.   Patient has microscopic hematuria. She has urge incontinence frequency and significant nocturia. return with CT scan and for cystoscopy deferred for insurance reasons. Call if urine culture positive. I spent many minutes repeating the work-up of microscopic hematuria and explaining the differential diagnosis. The conversation was quite circular. I am not certain how else I could explain things better. She also would like to have her frequency day and night and urgency helped and we will discuss this next time. She understands that her urinalysis today was negative but if if its been positive in the past that should not be ignored especially in someone with a smoking history   Patient complains of a little bit of burning since some sort of biopsy many months ago in the hospital. The last hospital note was for poorly controlled diabetes. She had a positive urine culture in April 2023. CT scan demonstrated pancreatic abnormality discussed with patient. The CT scan will be sent to primary care and flag. The CT scan also demonstrated cystitis.   On cystoscopy patient had diffuse erythema. It was in patches. There is a few white flecks. Urine was not that cloudy. Urine was sent for culture. Pictures were taken and kept   The patient likely has chronic cystitis based upon cystoscopic findings, CT scan and at least one positive culture in the last several months. She does have a smoking history. I called in ciprofloxacin 250 mg twice a day for 7 days. I will then start her on trimethoprim 100 mg 30x11. I will repeat her cystoscopy in 6 weeks.  If she still has erythema she understands that she would need a biopsy from one of my partners and she is aware of this.. I am hoping that once a day antibiotic will help down regulate her overactive bladder.   Based upon the mucosal enhancement and CT findings and degree of erythema today the cystitis may still persist at 6 weeks. Having said that I will recommend biopsy if it is still present to rule out carcinoma in situ   Send note with CT scan report to primary care about pancreas   Patient has not been taking her trimethoprim. She states she was having less burning on it but having a little bit of burning today at the end of urination only. I did not want to get a false positive. I sent the urine for culture and put her back on trimethoprim. Repeat cystoscopy in about 6 weeks. Call if culture is positive   I have a picture of the last cystoscopic findings. Last culture was negative   Co-pay for visit will be returned. She states she was going less frequently day and night on the trimethoprim.   Patient says she has little bit of burning at the end of urination but overall improved on trimethoprim.  On cystoscopy patient had diffuse erythema. She had white flecks in the urine. Some of the mucosa almost looked edematous. She had the urinate quite soon upon bladder filling. The findings were in keeping with chronic cystitis and it was very diffuse  2 more  pictures were taken and will be placed in the medical record   Picture was drawn. Patient understands she likely has chronic cystitis and that a biopsy would demonstrate inflammation. The differential diagnosis of carcinoma in situ was discussed. The role of referral for cystoscopy and biopsy was discussed. I want her to stay on trimethoprim.   I think she is willing to proceed with a bladder biopsy for safety reasons. She may be on a blood thinner but it was not written in her medical record. I will see her in 6 months on trimethoprim. She  understands a bladder biopsy will not change the natural history of her burning at the end of urination which she tolerates well. Call if culture positive   TOday  Frequency stable. 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 She has had BCG. Surveillance cystoscopy was done May 2024. There may be a recurrence on the right side and Dr. Alvester Morin recommended another resection. She is scheduled for the 24th of this month   Clinically not infected. Trimethoprim 100 mg 3 x 11 sent to pharmacy and I will see her in 1 year.     ALLERGIES: None   MEDICATIONS: Levothyroxine Sodium 88 mcg tablet  Macrobid 100 mg capsule 1 capsule PO Q HS  Trimethoprim 100 mg tablet 1 tablet PO Daily  Amiodarone Hcl 200 mg tablet  Eliquis 2.5 mg tablet  Klor-Con  Novolog  Pantoprazole Sodium 40 mg tablet, delayed release  Tiadylt Er 180 mg capsule, extended release 24hr  Trulicity     Notes:  .   GU PSH: Bladder Instill AntiCA Agent - 02/24/2023, 02/17/2023, 02/02/2023, 01/26/2023, 01/05/2023, 12/29/2022, 12/21/2022 Cystoscopy - 03/09/2023, 09/22/2022, 06/16/2022 Cystoscopy TURBT >5 cm - 11/19/2022 Locm 300-399Mg /Ml Iodine,1Ml - 05/18/2022       PSH Notes: Gall Bladder surgery   NON-GU PSH: Hysterectomy     GU PMH: Bladder Cancer overlapping sites - 03/09/2023, - 02/24/2023, - 02/17/2023, - 02/02/2023, - 01/26/2023, - 01/12/2023, - 01/05/2023, - 12/29/2022, - 12/21/2022, - 11/26/2022 CIS of the bladder - 03/09/2023, - 11/26/2022 Chronic cystitis (w/o hematuria) - 10/13/2022 Microscopic hematuria - 10/13/2022, - 09/22/2022, - 08/04/2022, - 06/16/2022, - 05/18/2022, - 04/23/2022 Urge incontinence - 09/22/2022, - 08/04/2022, - 04/23/2022 Urinary Tract Inf, Unspec site - 09/22/2022 Urinary Frequency - 06/16/2022, - 04/23/2022 Nocturia - 04/23/2022      PMH Notes:  1898-10-11 00:00:00 - Note: Normal Routine History And Physical Designer, 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82)   NON-GU PMH: Atrial Fibrillation Cardiac murmur,  unspecified GERD Hypercholesterolemia Hypertension Hyperthyroidism Other heart failure    FAMILY HISTORY: Cancer - Daughter Depression - Daughter Heart problem - Daughter Kidney Stones - Daughter Prostate Cancer - Brother renal failure - Aunt   SOCIAL HISTORY: Marital Status: Widowed    REVIEW OF SYSTEMS:    GU Review Female:   Patient denies frequent urination, hard to postpone urination, burning /pain with urination, get up at night to urinate, leakage of urine, stream starts and stops, trouble starting your stream, have to strain to urinate, and being pregnant.  Gastrointestinal (Upper):   Patient denies nausea, vomiting, and indigestion/ heartburn.  Gastrointestinal (Lower):   Patient denies diarrhea and constipation.  Constitutional:   Patient denies fever, night sweats, weight loss, and fatigue.  Skin:   Patient denies skin rash/ lesion and itching.  Eyes:   Patient denies blurred vision and double vision.  Ears/ Nose/ Throat:   Patient denies sore throat and sinus problems.  Hematologic/Lymphatic:   Patient denies swollen  glands and easy bruising.  Cardiovascular:   Patient denies leg swelling and chest pains.  Respiratory:   Patient denies cough and shortness of breath.  Endocrine:   Patient denies excessive thirst.  Musculoskeletal:   Patient denies back pain and joint pain.  Neurological:   Patient denies headaches and dizziness.  Psychologic:   Patient denies depression and anxiety.   VITAL SIGNS: None   Physical exam: NAD NLR Abdomen soft NT/ND  PAST DATA REVIEW: None   PROCEDURES:          Visit Complexity - G2211          Urinalysis w/Scope Dipstick Dipstick Cont'd Micro  Color: Yellow Bilirubin: Neg mg/dL WBC/hpf: 10 - 96/EAV  Appearance: Slightly Cloudy Ketones: Trace mg/dL RBC/hpf: 3 - 40/JWJ  Specific Gravity: 1.020 Blood: 3+ ery/uL Bacteria: Rare (0-9/hpf)  pH: 5.5 Protein: Trace mg/dL Cystals: NS (Not Seen)  Glucose: Trace mg/dL Urobilinogen:  0.2 mg/dL Casts: NS (Not Seen)    Nitrites: Neg Trichomonas: Not Present    Leukocyte Esterase: 2+ leu/uL Mucous: Not Present      Epithelial Cells: NS (Not Seen)      Yeast: NS (Not Seen)      Sperm: Not Present    ASSESSMENT:      ICD-10 Details  1 GU:   Microscopic hematuria - R31.21   2   Bladder Cancer overlapping sites - C67.8      PLAN:            Medications Refill Meds: Trimethoprim 100 mg tablet 1 tablet PO Daily   #30  11 Refill(s)            Schedule Return Visit/Planned Activity: 1 Year - Office Visit          Document Letter(s):  Created for Patient: Clinical Summary         Next Appointment:      Next Appointment: 04/04/2023 09:30 AM    Appointment Type: Surgery     Location: [facility] [phone]    Provider: Modena Slater, III, M.D.    Reason for Visit: OP WL TURBT      Signed by Alfredo Martinez, M.D. on 03/31/23 at 7:17 AM (EDT

## 2023-04-04 NOTE — Anesthesia Postprocedure Evaluation (Signed)
Anesthesia Post Note  Patient: Katerine Winders  Procedure(s) Performed: TRANSURETHRAL RESECTION OF BLADDER TUMOR (TURBT)     Patient location during evaluation: PACU Anesthesia Type: General Level of consciousness: awake and alert Pain management: pain level controlled Vital Signs Assessment: post-procedure vital signs reviewed and stable Respiratory status: spontaneous breathing, nonlabored ventilation, respiratory function stable and patient connected to nasal cannula oxygen Cardiovascular status: blood pressure returned to baseline and stable Postop Assessment: no apparent nausea or vomiting Anesthetic complications: no   No notable events documented.  Last Vitals:  Vitals:   04/04/23 1030 04/04/23 1045  BP: 131/61 120/70  Pulse: 66 69  Resp: 19 16  Temp: 36.5 C 36.8 C  SpO2: 93% 92%    Last Pain:  Vitals:   04/04/23 1045  TempSrc:   PainSc: 4                  Trevor Iha

## 2023-04-05 ENCOUNTER — Encounter (HOSPITAL_COMMUNITY): Payer: Self-pay | Admitting: Urology

## 2023-04-05 LAB — SURGICAL PATHOLOGY

## 2023-04-06 ENCOUNTER — Encounter (HOSPITAL_BASED_OUTPATIENT_CLINIC_OR_DEPARTMENT_OTHER): Payer: Self-pay

## 2023-04-06 ENCOUNTER — Emergency Department (HOSPITAL_BASED_OUTPATIENT_CLINIC_OR_DEPARTMENT_OTHER): Payer: Medicare PPO

## 2023-04-06 ENCOUNTER — Ambulatory Visit: Payer: Self-pay

## 2023-04-06 ENCOUNTER — Emergency Department (HOSPITAL_BASED_OUTPATIENT_CLINIC_OR_DEPARTMENT_OTHER)
Admission: EM | Admit: 2023-04-06 | Discharge: 2023-04-07 | Disposition: A | Discharge status: Home / Self Care | Payer: Medicare PPO | Attending: Emergency Medicine | Admitting: Emergency Medicine

## 2023-04-06 ENCOUNTER — Other Ambulatory Visit: Payer: Self-pay

## 2023-04-06 DIAGNOSIS — N183 Chronic kidney disease, stage 3 unspecified: Secondary | ICD-10-CM | POA: Diagnosis not present

## 2023-04-06 DIAGNOSIS — I129 Hypertensive chronic kidney disease with stage 1 through stage 4 chronic kidney disease, or unspecified chronic kidney disease: Secondary | ICD-10-CM | POA: Diagnosis not present

## 2023-04-06 DIAGNOSIS — Z8551 Personal history of malignant neoplasm of bladder: Secondary | ICD-10-CM | POA: Insufficient documentation

## 2023-04-06 DIAGNOSIS — N3001 Acute cystitis with hematuria: Secondary | ICD-10-CM | POA: Insufficient documentation

## 2023-04-06 DIAGNOSIS — E109 Type 1 diabetes mellitus without complications: Secondary | ICD-10-CM | POA: Insufficient documentation

## 2023-04-06 DIAGNOSIS — R911 Solitary pulmonary nodule: Secondary | ICD-10-CM | POA: Diagnosis not present

## 2023-04-06 DIAGNOSIS — Z7901 Long term (current) use of anticoagulants: Secondary | ICD-10-CM | POA: Insufficient documentation

## 2023-04-06 DIAGNOSIS — C679 Malignant neoplasm of bladder, unspecified: Secondary | ICD-10-CM | POA: Insufficient documentation

## 2023-04-06 DIAGNOSIS — Z794 Long term (current) use of insulin: Secondary | ICD-10-CM | POA: Diagnosis not present

## 2023-04-06 DIAGNOSIS — K760 Fatty (change of) liver, not elsewhere classified: Secondary | ICD-10-CM | POA: Insufficient documentation

## 2023-04-06 DIAGNOSIS — D6869 Other thrombophilia: Secondary | ICD-10-CM | POA: Insufficient documentation

## 2023-04-06 DIAGNOSIS — E039 Hypothyroidism, unspecified: Secondary | ICD-10-CM | POA: Diagnosis not present

## 2023-04-06 DIAGNOSIS — R531 Weakness: Secondary | ICD-10-CM | POA: Insufficient documentation

## 2023-04-06 DIAGNOSIS — R918 Other nonspecific abnormal finding of lung field: Secondary | ICD-10-CM | POA: Diagnosis not present

## 2023-04-06 DIAGNOSIS — Z79899 Other long term (current) drug therapy: Secondary | ICD-10-CM | POA: Insufficient documentation

## 2023-04-06 LAB — COMPREHENSIVE METABOLIC PANEL
ALT: 39 U/L (ref 0–44)
AST: 34 U/L (ref 15–41)
Albumin: 3.4 g/dL — ABNORMAL LOW (ref 3.5–5.0)
Alkaline Phosphatase: 95 U/L (ref 38–126)
Anion gap: 12 (ref 5–15)
BUN: 35 mg/dL — ABNORMAL HIGH (ref 8–23)
CO2: 20 mmol/L — ABNORMAL LOW (ref 22–32)
Calcium: 8.8 mg/dL — ABNORMAL LOW (ref 8.9–10.3)
Chloride: 99 mmol/L (ref 98–111)
Creatinine, Ser: 1.6 mg/dL — ABNORMAL HIGH (ref 0.44–1.00)
GFR, Estimated: 31 mL/min — ABNORMAL LOW (ref >60)
Glucose, Bld: 320 mg/dL — ABNORMAL HIGH (ref 70–99)
Potassium: 4.4 mmol/L (ref 3.5–5.1)
Sodium: 131 mmol/L — ABNORMAL LOW (ref 135–145)
Total Bilirubin: 0.7 mg/dL (ref 0.3–1.2)
Total Protein: 7.4 g/dL (ref 6.5–8.1)

## 2023-04-06 LAB — CBC WITH DIFFERENTIAL/PLATELET
Abs Immature Granulocytes: 0.03 10*3/uL (ref 0.00–0.07)
Basophils Absolute: 0.1 10*3/uL (ref 0.0–0.1)
Basophils Relative: 1 %
Eosinophils Absolute: 0.1 10*3/uL (ref 0.0–0.5)
Eosinophils Relative: 1 %
HCT: 36.6 % (ref 36.0–46.0)
Hemoglobin: 11.9 g/dL — ABNORMAL LOW (ref 12.0–15.0)
Immature Granulocytes: 0 %
Lymphocytes Relative: 22 %
Lymphs Abs: 1.7 10*3/uL (ref 0.7–4.0)
MCH: 28.3 pg (ref 26.0–34.0)
MCHC: 32.5 g/dL (ref 30.0–36.0)
MCV: 87.1 fL (ref 80.0–100.0)
Monocytes Absolute: 0.9 10*3/uL (ref 0.1–1.0)
Monocytes Relative: 11 %
Neutro Abs: 5 10*3/uL (ref 1.7–7.7)
Neutrophils Relative %: 65 %
Platelets: 287 10*3/uL (ref 150–400)
RBC: 4.2 MIL/uL (ref 3.87–5.11)
RDW: 14.6 % (ref 11.5–15.5)
WBC: 7.7 10*3/uL (ref 4.0–10.5)
nRBC: 0 % (ref 0.0–0.2)

## 2023-04-06 LAB — URINALYSIS, W/ REFLEX TO CULTURE (INFECTION SUSPECTED)
Glucose, UA: >=500 mg/dL — AB
Ketones, ur: NEGATIVE mg/dL
Nitrite: NEGATIVE
Protein, ur: >=300 mg/dL — AB
RBC / HPF: >50 RBC/hpf (ref 0–5)
Specific Gravity, Urine: 1.02 (ref 1.005–1.030)
WBC, UA: >50 WBC/hpf (ref 0–5)
pH: 5.5 (ref 5.0–8.0)

## 2023-04-06 LAB — CBG MONITORING, ED: Glucose-Capillary: 311 mg/dL — ABNORMAL HIGH (ref 70–99)

## 2023-04-06 LAB — BETA-HYDROXYBUTYRIC ACID: Beta-Hydroxybutyric Acid: 0.18 mmol/L (ref 0.05–0.27)

## 2023-04-06 LAB — MAGNESIUM: Magnesium: 2.2 mg/dL (ref 1.7–2.4)

## 2023-04-06 MED ORDER — SODIUM CHLORIDE 0.9 % IV SOLN
1.0000 g | Freq: Once | INTRAVENOUS | Status: AC
  Administered 2023-04-06: 1 g via INTRAVENOUS
  Filled 2023-04-06: qty 10

## 2023-04-06 NOTE — Patient Outreach (Signed)
  Care Coordination   Follow Up Visit Note   04/06/2023 Name: [name] MRN: [id number] DOB: June 08, 1935  [name] is a 88 y.o. year old female who sees [name], MD for primary care. I spoke with  Adalberto Cole by phone today.  What matters to the patients health and wellness today?  Getting sugar down    Goals Addressed             This Visit's Progress    Diabetes Management       Patient Goals/Self Care Activities: -Patient/Caregiver will self-administer medications as prescribed as evidenced by self-report/primary caregiver report  -Patient/Caregiver will attend all scheduled provider appointments as evidenced by clinician review of documented attendance to scheduled appointments and patient/caregiver report -Patient/Caregiver will call provider office for new concerns or questions as evidenced by review of documented incoming telephone call notes and patient report  -check blood sugar at prescribed times -check blood sugar if I feel it is too high or too low -record values and write them down take them to all doctor visits  -Discussed low carbohydrate diet and limiting sweets  Patient bladder procedure.  She reports no cancer detected. Follow up this week.  Blood sugars running high with last check 307. Patient states that endocrinologist thinks it is due to recent bladder treatments.  Discussed importance of diet control at this time.               SDOH assessments and interventions completed:  Yes     Care Coordination Interventions:  Yes, provided   Follow up plan: Follow up call scheduled for July    Encounter Outcome:  Pt. Visit Completed   Bary Leriche, RN, MSN [facility] Care Management Care Management Coordinator Direct Line [phone]

## 2023-04-06 NOTE — ED Provider Notes (Signed)
[name] Provider Note   CSN: [id number] Arrival date & time: 04/06/23  2056     History  Chief Complaint  Patient presents with   Weakness    [name] is a 88 y.o. 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 Sxs worsening since biopsy Monday and also endorses hyperglycemia. States her blood glucose read "high" today.  However she rechecked it when she was getting ready go back to the emergency department today and her blood sugar was in the 300s.  Her daughter at bedside states that they have cameras in her home, she lives independently, and she has just been back-and-forth to the couch, fatigued, this is unusual for her.  She denies chest pain, shortness of breath, cough, flulike symptoms, headache, visual changes, abdominal pain, nausea vomit diarrhea constipation, urinary symptoms including dysuria/hematuria, vaginal symptoms, lower extremity edema.  She states she still currently taking antibiotic after being diagnosed with a UTI.   Patient's daughter states that she had been treated for UTI originally, ultimately had cystoscopy w/ urology who saw tumor, biopsy showed cancer, treated w/ 6 weeks of chemotherapy. Had another lesion which was the one biopsied on 04/04/23 which daughter states pathology has come back as inflammation, not malignancy.   Per chart review from urology note 04/04/23: The patient likely has chronic cystitis based upon cystoscopic findings, CT scan and at least one positive culture in the last several months. She does have a smoking history. I called in ciprofloxacin 250 mg twice a day for 7 days. I will then start her on trimethoprim 100 mg 30x11. I will repeat her cystoscopy in 6 weeks. If she still has  erythema she understands that she would need a biopsy from one of my partners and she is aware of this.. I am hoping that once a day antibiotic will help down regulate her overactive bladder    Weakness      Home Medications Prior to Admission medications   Medication Sig Start Date End Date Taking? Authorizing Provider  amiodarone (PACERONE) 200 MG tablet Take 1 tablet (200 mg total) by mouth daily. 03/02/23   Hilty, Lisette Abu, MD  apixaban (ELIQUIS) 2.5 MG TABS tablet TAKE 1 TABLET BY MOUTH TWICE A DAY 02/06/23   Hilty, Lisette Abu, MD  Ascorbic Acid (VITAMIN C) 100 MG tablet Take 100 mg by mouth daily.    [provider]  calcium carbonate (TUMS - DOSED IN MG ELEMENTAL CALCIUM) 500 MG chewable tablet Chew 1 tablet by mouth daily.    [provider]  FeFum-FePoly-FA-B Cmp-C-Biot (FOLIVANE-PLUS) CAPS TAKE 1 CAPSULE BY MOUTH EVERY DAY IN THE MORNING 01/23/23   Heilingoetter, Cassandra L, PA-C  furosemide (LASIX) 20 MG tablet Take 1 tablet (20 mg total) by mouth daily as needed for fluid or edema. 03/24/23   Carlos Levering, NP  HYDROcodone-acetaminophen (NORCO) 5-325 MG tablet Take 1 tablet by mouth every 4 (four) hours as needed for moderate pain. 11/19/22   Ray Church III, MD  insulin aspart (NOVOLOG FLEXPEN) 100 UNIT/ML FlexPen Inject 8 Units into the skin 3 (three) times daily with meals. Patient taking differently: Inject 12 Units into the skin 3 (three) times daily with meals. 09/04/21   Narda Bonds, MD  insulin glargine, 2 Unit Dial, (TOUJEO MAX SOLOSTAR) 300 UNIT/ML Solostar  Pen Inject 14 Units into the skin at bedtime. Patient taking differently: Inject 18 Units into the skin at bedtime. 05/04/22   Almon Hercules, MD  KLOR-CON M20 20 MEQ tablet Take 20 mEq by mouth daily. 03/22/23   [provider]  levothyroxine (SYNTHROID) 88 MCG tablet Take 1 tablet (88 mcg total) by mouth daily before breakfast. 02/08/22   Osvaldo Shipper, MD  mirtazapine (REMERON) 15 MG  tablet Take 15 mg by mouth at bedtime. 03/12/23   [provider]  pantoprazole (PROTONIX) 40 MG tablet Take 1 tablet (40 mg total) by mouth daily at 6 (six) AM. 02/09/22   Osvaldo Shipper, MD  polyethylene glycol (MIRALAX / GLYCOLAX) 17 g packet Take 17 g by mouth daily. Patient taking differently: Take 17 g by mouth daily as needed for moderate constipation. 02/09/22   Osvaldo Shipper, MD  rosuvastatin (CRESTOR) 40 MG tablet Take 40 mg by mouth daily. 02/06/23   [provider]  senna-docusate (SENOKOT-S) 8.6-50 MG tablet Take 2 tablets by mouth at bedtime. 02/08/22   Osvaldo Shipper, MD  TIADYLT ER 180 MG 24 hr capsule TAKE 1 CAPSULE BY MOUTH EVERY DAY 11/01/22   Hilty, Lisette Abu, MD  trimethoprim (TRIMPEX) 100 MG tablet Take 100 mg by mouth daily. 10/27/22   [provider]  TRULICITY 1.5 MG/0.5ML SOPN Inject 1.5 mg into the skin every 7 (seven) days.    [provider]      Allergies    Patient has no known allergies.    Review of Systems   Review of Systems  Neurological:  Positive for weakness.   A 10 point review of systems was performed and is negative unless otherwise reported in HPI.  Physical Exam Updated Vital Signs BP 130/62   Pulse 67   Temp 98 F (36.7 C)   Resp (!) 27   Ht 5\' 2"  (1.575 m)   Wt 62.6 kg   SpO2 94%   BMI 25.24 kg/m  Physical Exam General: Normal appearing elderly female, lying in bed.  HEENT: PERRLA, Sclera anicteric, MMM, trachea midline.  Cardiology: RRR, no murmurs/rubs/gallops. BL radial and DP pulses equal bilaterally.  Resp: Normal respiratory rate and effort. CTAB, no wheezes, rhonchi, crackles.  Abd: Soft, non-tender, non-distended. No rebound tenderness or guarding.  GU: Deferred. MSK: No peripheral edema or signs of trauma. Extremities without deformity or TTP. No cyanosis or clubbing. Skin: warm, dry. No rashes or lesions. Back: No CVA tenderness Neuro: A&Ox4, CNs II-XII grossly intact. MAEs. Sensation  grossly intact.  Psych: Normal mood and affect.   ED Results / Procedures / Treatments   Labs (all labs ordered are listed, but only abnormal results are displayed) Labs Reviewed  CBC WITH DIFFERENTIAL/PLATELET - Abnormal; Notable for the following components:      Result Value   Hemoglobin 11.9 (*)    All other components within normal limits  COMPREHENSIVE METABOLIC PANEL - Abnormal; Notable for the following components:   Sodium 131 (*)    CO2 20 (*)    Glucose, Bld 320 (*)    BUN 35 (*)    Creatinine, Ser 1.60 (*)    Calcium 8.8 (*)    Albumin 3.4 (*)    GFR, Estimated 31 (*)    All other components within normal limits  URINALYSIS, W/ REFLEX TO CULTURE (INFECTION SUSPECTED) - Abnormal; Notable for the following components:   Color, Urine RED (*)    APPearance CLOUDY (*)    Glucose, UA >=500 (*)  Hgb urine dipstick LARGE (*)    Bilirubin Urine MODERATE (*)    Protein, ur >=300 (*)    Leukocytes,Ua TRACE (*)    Bacteria, UA FEW (*)    All other components within normal limits  CBG MONITORING, ED - Abnormal; Notable for the following components:   Glucose-Capillary 311 (*)    All other components within normal limits  URINE CULTURE  MAGNESIUM  BETA-HYDROXYBUTYRIC ACID    EKG None  Radiology No results found.  Procedures Procedures    Medications Ordered in ED Medications  cefTRIAXone (ROCEPHIN) 1 g in sodium chloride 0.9 % 100 mL IVPB (has no administration in time range)    ED Course/ Medical Decision Making/ A&P                          Medical Decision Making Amount and/or Complexity of Data Reviewed Labs: ordered. Decision-making details documented in ED Course.    This patient presents to the ED for concern of generalized weakness, this involves an extensive number of treatment options, and is a complaint that carries with it a high risk of complications and morbidity.  I considered the following differential and admission for this acute,  potentially life threatening condition.   MDM:    DDX for generalized weakness includes but is not limited to:  Infectious processes such as UTI or , severe metabolic derangements or electrolyte abnormalities, ischemia/ACS though no CP and EKG w/o signs of arrhythmia/ischemia; heart failure though no murmurs, crackles, or LEE on exam; anemia, and intracranial/central processes but think these are unlikely given the history and physical exam.   Clinical Course as of 04/06/23 2324  Wed Apr 06, 2023  2107 Glucose-Capillary(!): 311 hyperglycemic [HN]  2208 WBC: 7.7 No leukocytosis  [HN]  2307 Urinalysis, w/ Reflex to Culture (Infection Suspected) -Urine, Clean Catch(!) Similar to prior UAs, and would be c/w recent biopsy. Culture ordered. Patient also without any symptoms of infection including fever/chills, dysuria, suprapubic TTP or flank pain. Patient is already taking ciprofloxacin x 7 days from urology. However, urine culture on 01/10/23 grew E Coli resistant to ciprofloxacin. Likely will need ceftriaxone rather than fluoroquinolone.  [HN]  2322 Anion gap: 12 No significant metabolic acidosis or elevated anion gap to indicate DKA but beta hydroxybutyrate still pending. [HN]    Clinical Course User Index [HN] Loetta Rough, MD    Labs: I Ordered, and personally interpreted labs.  The pertinent results include:  those listed above  Additional history obtained from chart review, daughter at bedside.    Social Determinants of Health: Patient lives independently   Disposition:  Patient is signed out to the oncoming ED physician Dr. Bernette Mayers who is made aware of her history, presentation, exam, workup, and plan. Plan is to give ceftriaxone/beta hydroxybutyrate and reevaluate. Patient will need f/u o/p w/ urology.   Co morbidities that complicate the patient evaluation  Past Medical History:  Diagnosis Date   Anemia    Arthritis    Colon polyp    Diabetes mellitus    type 1    Heart murmur    Hyperlipidemia    Hypertension    Hypothyroidism    Pneumonia    Poor appetite    Thyroid disease      Medicines Meds ordered this encounter  Medications   cefTRIAXone (ROCEPHIN) 1 g in sodium chloride 0.9 % 100 mL IVPB    Order Specific Question:   Antibiotic Indication:  Answer:   UTI    I have reviewed the patients home medicines and have made adjustments as needed  Problem List / ED Course: Problem List Items Addressed This Visit   None Visit Diagnoses     Generalized weakness    -  Primary                   This note was created using dictation software, which may contain spelling or grammatical errors.    Loetta Rough, MD 04/06/23 [id number]

## 2023-04-06 NOTE — ED Triage Notes (Signed)
Pt arrives with c/o generalized weakness that started about a week ago. Pt endorses fatigue. Per pt, her weakness has gotten worse since her biopsy Monday and her blood sugar has been elevated. Pt denies sick symptoms.

## 2023-04-06 NOTE — Patient Instructions (Signed)
Visit Information  Thank you for taking time to visit with me today. Please don't hesitate to contact me if I can be of assistance to you.   Following are the goals we discussed today:   Goals Addressed             This Visit's Progress    Diabetes Management       Patient Goals/Self Care Activities: -Patient/Caregiver will self-administer medications as prescribed as evidenced by self-report/primary caregiver report  -Patient/Caregiver will attend all scheduled provider appointments as evidenced by clinician review of documented attendance to scheduled appointments and patient/caregiver report -Patient/Caregiver will call provider office for new concerns or questions as evidenced by review of documented incoming telephone call notes and patient report  -check blood sugar at prescribed times -check blood sugar if I feel it is too high or too low -record values and write them down take them to all doctor visits  -Discussed low carbohydrate diet and limiting sweets  Patient bladder procedure.  She reports no cancer detected. Follow up this week.  Blood sugars running high with last check 307. Patient states that endocrinologist thinks it is due to recent bladder treatments.  Discussed importance of diet control at this time.               Our next appointment is by telephone on 04/19/23 at 1030 am  Please call the care guide team at [phone] if you need to cancel or reschedule your appointment.   If you are experiencing a Mental Health or Behavioral Health Crisis or need someone to talk to, please call the Suicide and Crisis Lifeline: 988   Patient verbalizes understanding of instructions and care plan provided today and agrees to view in MyChart. Active MyChart status and patient understanding of how to access instructions and care plan via MyChart confirmed with patient.     The patient has been provided with contact information for the care management team and has been  advised to call with any health related questions or concerns.   Bary Leriche, RN, MSN [facility] Care Management Care Management Coordinator Direct Line [phone]

## 2023-04-07 MED ORDER — CEPHALEXIN 500 MG PO CAPS: 500.0000 mg | ORAL_CAPSULE | Freq: Two times a day (BID) | ORAL | 0 refills | Status: DC

## 2023-04-07 MED ORDER — CEPHALEXIN 500 MG PO CAPS: 500.0000 mg | ORAL_CAPSULE | Freq: Two times a day (BID) | ORAL | 0 refills | Status: AC

## 2023-04-07 NOTE — ED Provider Notes (Signed)
Care of the patient assumed at the change of shift. Here for general weakness, recent bladder procedure. On Cipro after procedure, but prior culture was resistant. She has mild AKI but similar to previous. Awaiting Abx and reassessment for dispo.  Physical Exam  BP (!) 127/48   Pulse 71   Temp 98 F (36.7 C)   Resp 20   Ht 5\' 2"  (1.575 m)   Wt 62.6 kg   SpO2 93%   BMI 25.24 kg/m   Physical Exam  Procedures  Procedures  ED Course / MDM   Clinical Course as of 04/07/23 0052  Wed Apr 06, 2023  2107 Glucose-Capillary(!): 311 hyperglycemic [HN]  2208 WBC: 7.7 No leukocytosis  [HN]  2307 Urinalysis, w/ Reflex to Culture (Infection Suspected) -Urine, Clean Catch(!) Similar to prior UAs, and would be c/w recent biopsy. Culture ordered. Patient also without any symptoms of infection including fever/chills, dysuria, suprapubic TTP or flank pain. Patient is already taking ciprofloxacin x 7 days from urology. However, urine culture on 01/10/23 grew E Coli resistant to ciprofloxacin. Likely will need ceftriaxone rather than fluoroquinolone.  [HN]  2322 Anion gap: 12 No significant metabolic acidosis or elevated anion gap to indicate DKA but beta hydroxybutyrate still pending. [HN]    Clinical Course User Index [HN] Loetta Rough, MD   Medical Decision Making Problems Addressed: Acute cystitis with hematuria: acute illness or injury Generalized weakness: acute illness or injury Pulmonary nodule: undiagnosed new problem with uncertain prognosis  Amount and/or Complexity of Data Reviewed Labs: ordered. Decision-making details documented in ED Course. Radiology: ordered.   On reassessment, patient remains non toxic. Reassuring vitals. Discussed results including UTI with concern for resistance and incidental pulmonary nodule. Plan discharge with Rx for Keflex, culture pending. PCP follow up, RTED for any other concerns.         Pollyann Savoy, MD 04/07/23 [phone]

## 2023-04-07 NOTE — ED Notes (Signed)
Pt placed in a bed pan per request. Pt daughter will call when pt is ready.

## 2023-04-08 LAB — URINE CULTURE: Culture: NO GROWTH

## 2023-04-15 DIAGNOSIS — C678 Malignant neoplasm of overlapping sites of bladder: Secondary | ICD-10-CM | POA: Diagnosis not present

## 2023-04-19 ENCOUNTER — Ambulatory Visit: Payer: Self-pay

## 2023-04-19 NOTE — Patient Outreach (Signed)
  Care Coordination   Follow Up Visit Note   04/19/2023 Name: [name] MRN: [id number] DOB: 1935/05/05  Paree Nichols is a 88 y.o. year old female who sees Karen Brunette, MD for primary care. I spoke with  [name] by phone today.  What matters to the patients health and wellness today?  Recovery from bladder procedure    Goals Addressed             This Visit's Progress    Diabetes Management       Patient Goals/Self Care Activities: -Patient/Caregiver will self-administer medications as prescribed as evidenced by self-report/primary caregiver report  -Patient/Caregiver will attend all scheduled provider appointments as evidenced by clinician review of documented attendance to scheduled appointments and patient/caregiver report -Patient/Caregiver will call provider office for new concerns or questions as evidenced by review of documented incoming telephone call notes and patient report  -check blood sugar at prescribed times -check blood sugar if I feel it is too high or too low -record values and write them down take them to all doctor visits  -Discussed low carbohydrate diet and limiting sweets  Patient bladder procedure.  She reports blood tinge urine at times as patient reports to be expected.   Blood sugars running high with last check 283. Patient states that endocrinologist thinks it is due to recent bladder treatments.  Patient to make appointment with endocrinologist. Discussed importance of diet control at this time.               SDOH assessments and interventions completed:  Yes     Care Coordination Interventions:  Yes, provided   Follow up plan: Follow up call scheduled for August    Encounter Outcome:  Pt. Visit Completed   Karen Leriche, RN, MSN [facility] Care Management Care Management Coordinator Direct Line [phone]

## 2023-04-19 NOTE — Patient Instructions (Signed)
Visit Information  Thank you for taking time to visit with me today. Please don't hesitate to contact me if I can be of assistance to you.   Following are the goals we discussed today:   Goals Addressed             This Visit's Progress    Diabetes Management       Patient Goals/Self Care Activities: -Patient/Caregiver will self-administer medications as prescribed as evidenced by self-report/primary caregiver report  -Patient/Caregiver will attend all scheduled provider appointments as evidenced by clinician review of documented attendance to scheduled appointments and patient/caregiver report -Patient/Caregiver will call provider office for new concerns or questions as evidenced by review of documented incoming telephone call notes and patient report  -check blood sugar at prescribed times -check blood sugar if I feel it is too high or too low -record values and write them down take them to all doctor visits  -Discussed low carbohydrate diet and limiting sweets  Patient bladder procedure.  She reports blood tinge urine at times as patient reports to be expected.   Blood sugars running high with last check 283. Patient states that endocrinologist thinks it is due to recent bladder treatments.  Patient to make appointment with endocrinologist. Discussed importance of diet control at this time.               Our next appointment is by telephone on 05/17/23 at 1030am  Please call the care guide team at [phone] if you need to cancel or reschedule your appointment.   If you are experiencing a Mental Health or Behavioral Health Crisis or need someone to talk to, please call the Suicide and Crisis Lifeline: 988   Patient verbalizes understanding of instructions and care plan provided today and agrees to view in MyChart. Active MyChart status and patient understanding of how to access instructions and care plan via MyChart confirmed with patient.     The patient has been  provided with contact information for the care management team and has been advised to call with any health related questions or concerns.   Bary Leriche, RN, MSN [facility] Care Management Care Management Coordinator Direct Line [phone]

## 2023-04-26 DIAGNOSIS — R634 Abnormal weight loss: Secondary | ICD-10-CM | POA: Diagnosis not present

## 2023-04-26 DIAGNOSIS — R918 Other nonspecific abnormal finding of lung field: Secondary | ICD-10-CM | POA: Diagnosis not present

## 2023-04-27 DIAGNOSIS — E139 Other specified diabetes mellitus without complications: Secondary | ICD-10-CM | POA: Diagnosis not present

## 2023-04-28 ENCOUNTER — Other Ambulatory Visit: Payer: Self-pay | Admitting: Internal Medicine

## 2023-04-28 DIAGNOSIS — R918 Other nonspecific abnormal finding of lung field: Secondary | ICD-10-CM

## 2023-05-02 ENCOUNTER — Other Ambulatory Visit: Payer: Self-pay | Admitting: Physician Assistant

## 2023-05-02 DIAGNOSIS — D509 Iron deficiency anemia, unspecified: Secondary | ICD-10-CM

## 2023-05-17 ENCOUNTER — Ambulatory Visit: Payer: Self-pay

## 2023-05-17 NOTE — Patient Instructions (Signed)
Visit Information  Thank you for taking time to visit with me today. Please don't hesitate to contact me if I can be of assistance to you.   Following are the goals we discussed today:   Goals Addressed             This Visit's Progress    Diabetes Management       Patient Goals/Self Care Activities: -Patient/Caregiver will self-administer medications as prescribed as evidenced by self-report/primary caregiver report  -Patient/Caregiver will attend all scheduled provider appointments as evidenced by clinician review of documented attendance to scheduled appointments and patient/caregiver report -Patient/Caregiver will call provider office for new concerns or questions as evidenced by review of documented incoming telephone call notes and patient report  -check blood sugar at prescribed times -check blood sugar if I feel it is too high or too low -record values and write them down take them to all doctor visits  -Discussed low carbohydrate diet and limiting sweets   Patient reports that she has some weakness.  Her appetite had not been the best.  She reports her appetite medication has been increased.  Discussed importance of eating 3 small meals or 4-6 small meals per day plus increasing glucerna supplements to at least twice a day.  Most recent blood sugar 245. Discussed diabetes management and limiting carbohydrates. She verbalized understanding.            Our next appointment is by telephone on 06/14/23 at 1100 am  Please call the care guide team at [phone] if you need to cancel or reschedule your appointment.   If you are experiencing a Mental Health or Behavioral Health Crisis or need someone to talk to, please call the Suicide and Crisis Lifeline: 988   Patient verbalizes understanding of instructions and care plan provided today and agrees to view in MyChart. Active MyChart status and patient understanding of how to access instructions and care plan via MyChart  confirmed with patient.     The patient has been provided with contact information for the care management team and has been advised to call with any health related questions or concerns.   Bary Leriche, RN, MSN [facility] Care Management Care Management Coordinator Direct Line [phone]

## 2023-05-17 NOTE — Patient Outreach (Signed)
  Care Coordination   Follow Up Visit Note   05/17/2023 Name: Yasleen Lujan MRN: [id number] DOB: [birth year]  Maybeline Bulley is a 88 y.o. year old female who sees [name], MD for primary care. I spoke with  Adalberto Cole by phone today.  What matters to the patients health and wellness today?  Getting stronger    Goals Addressed             This Visit's Progress    Diabetes Management       Patient Goals/Self Care Activities: -Patient/Caregiver will self-administer medications as prescribed as evidenced by self-report/primary caregiver report  -Patient/Caregiver will attend all scheduled provider appointments as evidenced by clinician review of documented attendance to scheduled appointments and patient/caregiver report -Patient/Caregiver will call provider office for new concerns or questions as evidenced by review of documented incoming telephone call notes and patient report  -check blood sugar at prescribed times -check blood sugar if I feel it is too high or too low -record values and write them down take them to all doctor visits  -Discussed low carbohydrate diet and limiting sweets   Patient reports that she has some weakness.  Her appetite had not been the best.  She reports her appetite medication has been increased.  Discussed importance of eating 3 small meals or 4-6 small meals per day plus increasing glucerna supplements to at least twice a day.  Most recent blood sugar 245. Discussed diabetes management and limiting carbohydrates. She verbalized understanding.            SDOH assessments and interventions completed:  Yes     Care Coordination Interventions:  Yes, provided   Follow up plan: Follow up call scheduled for September.    Encounter Outcome:  Pt. Visit Completed   Bary Leriche, RN, MSN [facility] Care Management Care Management Coordinator Direct Line [phone]

## 2023-05-23 ENCOUNTER — Ambulatory Visit
Admission: RE | Admit: 2023-05-23 | Discharge: 2023-05-23 | Disposition: A | Discharge status: Home / Self Care | Payer: Medicare PPO | Source: Ambulatory Visit | Attending: Internal Medicine | Admitting: Internal Medicine

## 2023-05-23 DIAGNOSIS — R911 Solitary pulmonary nodule: Secondary | ICD-10-CM | POA: Diagnosis not present

## 2023-05-23 DIAGNOSIS — J439 Emphysema, unspecified: Secondary | ICD-10-CM | POA: Diagnosis not present

## 2023-05-23 DIAGNOSIS — R918 Other nonspecific abnormal finding of lung field: Secondary | ICD-10-CM

## 2023-05-25 ENCOUNTER — Other Ambulatory Visit: Payer: Self-pay

## 2023-05-25 ENCOUNTER — Emergency Department (HOSPITAL_COMMUNITY)
Admission: EM | Admit: 2023-05-25 | Discharge: 2023-05-25 | Disposition: A | Discharge status: Home / Self Care | Payer: Medicare PPO | Attending: Emergency Medicine | Admitting: Emergency Medicine

## 2023-05-25 ENCOUNTER — Emergency Department (HOSPITAL_COMMUNITY): Payer: Medicare PPO

## 2023-05-25 DIAGNOSIS — R531 Weakness: Secondary | ICD-10-CM | POA: Diagnosis not present

## 2023-05-25 DIAGNOSIS — Z7901 Long term (current) use of anticoagulants: Secondary | ICD-10-CM | POA: Diagnosis not present

## 2023-05-25 DIAGNOSIS — I4581 Long QT syndrome: Secondary | ICD-10-CM | POA: Diagnosis not present

## 2023-05-25 DIAGNOSIS — E119 Type 2 diabetes mellitus without complications: Secondary | ICD-10-CM | POA: Insufficient documentation

## 2023-05-25 DIAGNOSIS — Z794 Long term (current) use of insulin: Secondary | ICD-10-CM | POA: Diagnosis not present

## 2023-05-25 DIAGNOSIS — R911 Solitary pulmonary nodule: Secondary | ICD-10-CM | POA: Diagnosis not present

## 2023-05-25 DIAGNOSIS — I10 Essential (primary) hypertension: Secondary | ICD-10-CM | POA: Insufficient documentation

## 2023-05-25 DIAGNOSIS — R0989 Other specified symptoms and signs involving the circulatory and respiratory systems: Secondary | ICD-10-CM | POA: Diagnosis not present

## 2023-05-25 DIAGNOSIS — Z8551 Personal history of malignant neoplasm of bladder: Secondary | ICD-10-CM | POA: Insufficient documentation

## 2023-05-25 DIAGNOSIS — R9431 Abnormal electrocardiogram [ECG] [EKG]: Secondary | ICD-10-CM

## 2023-05-25 DIAGNOSIS — Z79899 Other long term (current) drug therapy: Secondary | ICD-10-CM | POA: Diagnosis not present

## 2023-05-25 LAB — CBC WITH DIFFERENTIAL/PLATELET
Abs Immature Granulocytes: 0.03 10*3/uL (ref 0.00–0.07)
Basophils Absolute: 0.1 10*3/uL (ref 0.0–0.1)
Basophils Relative: 1 %
Eosinophils Absolute: 0.1 10*3/uL (ref 0.0–0.5)
Eosinophils Relative: 1 %
HCT: 35.5 % — ABNORMAL LOW (ref 36.0–46.0)
Hemoglobin: 11.3 g/dL — ABNORMAL LOW (ref 12.0–15.0)
Immature Granulocytes: 0 %
Lymphocytes Relative: 22 %
Lymphs Abs: 1.7 10*3/uL (ref 0.7–4.0)
MCH: 28.7 pg (ref 26.0–34.0)
MCHC: 31.8 g/dL (ref 30.0–36.0)
MCV: 90.1 fL (ref 80.0–100.0)
Monocytes Absolute: 0.7 10*3/uL (ref 0.1–1.0)
Monocytes Relative: 9 %
Neutro Abs: 5.2 10*3/uL (ref 1.7–7.7)
Neutrophils Relative %: 67 %
Platelets: 238 10*3/uL (ref 150–400)
RBC: 3.94 MIL/uL (ref 3.87–5.11)
RDW: 14.8 % (ref 11.5–15.5)
WBC: 7.9 10*3/uL (ref 4.0–10.5)
nRBC: 0 % (ref 0.0–0.2)

## 2023-05-25 LAB — COMPREHENSIVE METABOLIC PANEL
ALT: 38 U/L (ref 0–44)
AST: 41 U/L (ref 15–41)
Albumin: 3.4 g/dL — ABNORMAL LOW (ref 3.5–5.0)
Alkaline Phosphatase: 81 U/L (ref 38–126)
Anion gap: 11 (ref 5–15)
BUN: 22 mg/dL (ref 8–23)
CO2: 22 mmol/L (ref 22–32)
Calcium: 8.8 mg/dL — ABNORMAL LOW (ref 8.9–10.3)
Chloride: 100 mmol/L (ref 98–111)
Creatinine, Ser: 1.03 mg/dL — ABNORMAL HIGH (ref 0.44–1.00)
GFR, Estimated: 53 mL/min — ABNORMAL LOW (ref >60)
Glucose, Bld: 111 mg/dL — ABNORMAL HIGH (ref 70–99)
Potassium: 4 mmol/L (ref 3.5–5.1)
Sodium: 133 mmol/L — ABNORMAL LOW (ref 135–145)
Total Bilirubin: 0.4 mg/dL (ref 0.3–1.2)
Total Protein: 6.8 g/dL (ref 6.5–8.1)

## 2023-05-25 LAB — URINALYSIS, W/ REFLEX TO CULTURE (INFECTION SUSPECTED)
Bilirubin Urine: NEGATIVE
Glucose, UA: 50 mg/dL — AB
Ketones, ur: NEGATIVE mg/dL
Nitrite: NEGATIVE
Protein, ur: 30 mg/dL — AB
RBC / HPF: >50 RBC/hpf (ref 0–5)
Specific Gravity, Urine: 1.017 (ref 1.005–1.030)
WBC, UA: >50 WBC/hpf (ref 0–5)
pH: 5 (ref 5.0–8.0)

## 2023-05-25 LAB — CBG MONITORING, ED
Glucose-Capillary: 66 mg/dL — ABNORMAL LOW (ref 70–99)
Glucose-Capillary: 212 mg/dL — ABNORMAL HIGH (ref 70–99)

## 2023-05-25 LAB — TROPONIN I (HIGH SENSITIVITY): Troponin I (High Sensitivity): 5 ng/L (ref <18)

## 2023-05-25 LAB — MAGNESIUM: Magnesium: 2.2 mg/dL (ref 1.7–2.4)

## 2023-05-25 MED ORDER — LACTATED RINGERS IV BOLUS: 1000.0000 mL | Freq: Once | INTRAVENOUS | Status: DC

## 2023-05-25 MED ORDER — CEPHALEXIN 500 MG PO CAPS: 500.0000 mg | ORAL_CAPSULE | Freq: Two times a day (BID) | ORAL | 0 refills | Status: AC

## 2023-05-25 NOTE — ED Triage Notes (Signed)
Pt had a bladder biopsy for bladder cancer in June, since then pt has continued to become weaker. Pt states she is now able to stand for only 15 min periods. Denies any pain, N/V/D, coughing. Pt states that she is still seeing some blood in her urine.

## 2023-05-25 NOTE — Discharge Instructions (Addendum)
You are being prescribed an antibiotic for a possible UTI.  Follow-up with the urine culture results because of this is negative then you should stop the antibiotics.  Otherwise, your QT interval on your EKG was longer than it should be.  Thus your cardiology team wants you to decrease your Amiodarone to 100 mg per day (instead of 200 mg).  Follow-up with your cardiologist, Dr. Rennis Golden.  If you develop new or worsening weakness, fever, dizziness, lightheadedness, passing out, or any other new/concerning symptoms then return to the ER or call 911.

## 2023-05-25 NOTE — ED Provider Notes (Signed)
[place] EMERGENCY DEPARTMENT AT [facility] Provider Note   CSN: [id number] Arrival date & time: 05/25/23  1128     History  Chief Complaint  Patient presents with   Weakness    [name] is a 88 y.o. female.  HPI 88 year old female with a history of hypertension, hyperlipidemia, paroxysmal A-fib, type 2 diabetes, bladder cancer status posttreatment, presents with generalized weakness.  For the past couple months since having a bladder biopsy she has had weakness in her legs.  She also seems to be losing some weight.  The biopsy came back negative for cancer after her treatment earlier in the year.  She is able to walk but after about 10 minutes or so her legs seem to get so weak that they will give out on her.  A few times since her biopsy she has had large amounts of hematuria that then resolved.  She denies any headache, chest pain, shortness of breath, back pain, dizziness, abdominal pain or dysuria.  No incontinence.  There is no numbness in her legs.  She walks by herself without assistance and lives at home by herself. Has good appetite but rarely can finish meals due to getting full.  Home Medications Prior to Admission medications   Medication Sig Start Date End Date Taking? Authorizing Provider  cephALEXin (KEFLEX) 500 MG capsule Take 1 capsule (500 mg total) by mouth 2 (two) times daily for 7 days. 05/25/23 06/01/23 Yes Pricilla Loveless, MD  amiodarone (PACERONE) 200 MG tablet Take 1 tablet (200 mg total) by mouth daily. 03/02/23   Hilty, Lisette Abu, MD  apixaban (ELIQUIS) 2.5 MG TABS tablet TAKE 1 TABLET BY MOUTH TWICE A DAY 02/06/23   Hilty, Lisette Abu, MD  Ascorbic Acid (VITAMIN C) 100 MG tablet Take 100 mg by mouth daily.    [provider]  calcium carbonate (TUMS - DOSED IN MG ELEMENTAL CALCIUM) 500 MG chewable tablet Chew 1 tablet by mouth daily.    [provider]  FeFum-FePoly-FA-B Cmp-C-Biot (FOLIVANE-PLUS) CAPS TAKE 1 CAPSULE BY MOUTH  EVERY DAY IN THE MORNING 05/02/23   Heilingoetter, Cassandra L, PA-C  furosemide (LASIX) 20 MG tablet Take 1 tablet (20 mg total) by mouth daily as needed for fluid or edema. 03/24/23   Carlos Levering, NP  HYDROcodone-acetaminophen (NORCO) 5-325 MG tablet Take 1 tablet by mouth every 4 (four) hours as needed for moderate pain. 11/19/22   Ray Church III, MD  insulin aspart (NOVOLOG FLEXPEN) 100 UNIT/ML FlexPen Inject 8 Units into the skin 3 (three) times daily with meals. Patient taking differently: Inject 12 Units into the skin 3 (three) times daily with meals. 09/04/21   Narda Bonds, MD  insulin glargine, 2 Unit Dial, (TOUJEO MAX SOLOSTAR) 300 UNIT/ML Solostar Pen Inject 14 Units into the skin at bedtime. Patient taking differently: Inject 18 Units into the skin at bedtime. 05/04/22   Almon Hercules, MD  KLOR-CON M20 20 MEQ tablet Take 20 mEq by mouth daily. 03/22/23   [provider]  levothyroxine (SYNTHROID) 88 MCG tablet Take 1 tablet (88 mcg total) by mouth daily before breakfast. 02/08/22   Osvaldo Shipper, MD  mirtazapine (REMERON) 15 MG tablet Take 15 mg by mouth at bedtime. 03/12/23   [provider]  pantoprazole (PROTONIX) 40 MG tablet Take 1 tablet (40 mg total) by mouth daily at 6 (six) AM. 02/09/22   Osvaldo Shipper, MD  polyethylene glycol (MIRALAX / GLYCOLAX) 17 g packet Take 17 g by  mouth daily. Patient taking differently: Take 17 g by mouth daily as needed for moderate constipation. 02/09/22   Osvaldo Shipper, MD  rosuvastatin (CRESTOR) 40 MG tablet Take 40 mg by mouth daily. 02/06/23   [provider]  senna-docusate (SENOKOT-S) 8.6-50 MG tablet Take 2 tablets by mouth at bedtime. 02/08/22   Osvaldo Shipper, MD  TIADYLT ER 180 MG 24 hr capsule TAKE 1 CAPSULE BY MOUTH EVERY DAY 11/01/22   Hilty, Lisette Abu, MD  trimethoprim (TRIMPEX) 100 MG tablet Take 100 mg by mouth daily. 10/27/22   [provider]  TRULICITY 1.5 MG/0.5ML SOPN Inject 1.5 mg into the  skin every 7 (seven) days.    [provider]      Allergies    Patient has no known allergies.    Review of Systems   Review of Systems  Constitutional:  Negative for fever.  Respiratory:  Negative for cough and shortness of breath.   Cardiovascular:  Negative for chest pain.  Gastrointestinal:  Negative for abdominal pain, diarrhea and vomiting.  Genitourinary:  Positive for hematuria. Negative for dysuria.  Musculoskeletal:  Negative for back pain.  Neurological:  Positive for weakness. Negative for dizziness, numbness and headaches.    Physical Exam Updated Vital Signs BP 129/66   Pulse 73   Temp 98 F (36.7 C) (Oral)   Resp (!) 21   Ht 5\' 2"  (1.575 m)   Wt 62.6 kg   SpO2 97%   BMI 25.24 kg/m  Physical Exam Vitals and nursing note reviewed.  Constitutional:      General: She is not in acute distress.    Appearance: She is well-developed. She is not ill-appearing or diaphoretic.  HENT:     Head: Normocephalic and atraumatic.  Cardiovascular:     Rate and Rhythm: Normal rate and regular rhythm.     Heart sounds: Normal heart sounds.  Pulmonary:     Effort: Pulmonary effort is normal.     Breath sounds: Normal breath sounds. No wheezing or rales.  Abdominal:     Palpations: Abdomen is soft.     Tenderness: There is no abdominal tenderness. There is no right CVA tenderness or left CVA tenderness.  Musculoskeletal:     Cervical back: No tenderness.     Thoracic back: No tenderness.     Lumbar back: No tenderness.  Skin:    General: Skin is warm and dry.  Neurological:     Mental Status: She is alert.     Comments: CN 3-12 grossly intact. 5/5 strength in both upper extremities. 4/5 strength in both lower extremities. Grossly normal sensation. Normal finger to nose.      ED Results / Procedures / Treatments   Labs (all labs ordered are listed, but only abnormal results are displayed) Labs Reviewed  COMPREHENSIVE METABOLIC PANEL - Abnormal; Notable  for the following components:      Result Value   Sodium 133 (*)    Glucose, Bld 111 (*)    Creatinine, Ser 1.03 (*)    Calcium 8.8 (*)    Albumin 3.4 (*)    GFR, Estimated 53 (*)    All other components within normal limits  CBC WITH DIFFERENTIAL/PLATELET - Abnormal; Notable for the following components:   Hemoglobin 11.3 (*)    HCT 35.5 (*)    All other components within normal limits  URINALYSIS, W/ REFLEX TO CULTURE (INFECTION SUSPECTED) - Abnormal; Notable for the following components:   APPearance CLOUDY (*)  Glucose, UA 50 (*)    Hgb urine dipstick LARGE (*)    Protein, ur 30 (*)    Leukocytes,Ua LARGE (*)    Bacteria, UA RARE (*)    All other components within normal limits  CBG MONITORING, ED - Abnormal; Notable for the following components:   Glucose-Capillary 66 (*)    All other components within normal limits  CBG MONITORING, ED - Abnormal; Notable for the following components:   Glucose-Capillary 212 (*)    All other components within normal limits  URINE CULTURE  MAGNESIUM  TROPONIN I (HIGH SENSITIVITY)    EKG EKG Interpretation Date/Time:  Wednesday May 25 2023 14:34:00 EDT Ventricular Rate:  73 PR Interval:  214 QRS Duration:  107 QT Interval:  510 QTC Calculation: 563 R Axis:   -27  Text Interpretation: Sinus rhythm Borderline prolonged PR interval Left ventricular hypertrophy Probable anterior infarct, age indeterminate Prolonged QT interval overall similar to June 2024 Confirmed by Pricilla Loveless [phone]) on 05/25/2023 2:43:12 PM  Radiology DG Chest 2 View  Result Date: 05/25/2023 CLINICAL DATA:  generally weak, lung nodule EXAM: CHEST - 2 VIEW COMPARISON:  CXR 04/06/23 FINDINGS: No pleural effusion. No pneumothorax. No focal airspace opacity. Low lung volumes. Likely normal cardiac and mediastinal contours when accounting for low lung volumes. No radiographically apparent displaced rib fractures. Visualized upper abdomen unremarkable. Visualized  upper abdomen is notable for surgical clips in the right upper quadrant. Vertebral body heights are maintained. IMPRESSION: No focal airspace opacity Electronically Signed   By: Lorenza Cambridge M.D.   On: 05/25/2023 13:52    Procedures Procedures    Medications Ordered in ED Medications  lactated ringers bolus 1,000 mL (has no administration in time range)    ED Course/ Medical Decision Making/ A&P                                 Medical Decision Making Amount and/or Complexity of Data Reviewed Labs: ordered.    Details: Improved renal function.  Normal WBC.  Large leukocytes but essentially no bacteria in the urinalysis. Radiology: ordered and independent interpretation performed.    Details: No pneumonia or CHF ECG/medicine tests: ordered and independent interpretation performed.    Details: Prolonged QTc in the 500s  Risk Prescription drug management.   Patient is feeling better and was able to ambulate with a walker with no issues after a bolus of fluids.  I discussed that this seems to be a progressive issue since her cancer treatment and recent surgery.  I do not think there is an acute emergent condition.  I also think she probably needs some physical therapy. She has a walker at home and given she was able to ambulate here without recurrent feelings of weakness with our walker, I think she will do okay at home.  Discussed options with her and daughter and will order home health physical therapy.  Discussed she can also talk to her PCP about other physical therapy treatments.  Her urine has large leukocytes though rare bacteria.  She does not have specific UTI symptoms but after discussion with patient and daughter they would like to start antibiotics.  Last urine culture from a few months ago had some resistances but cephalosporins were okay.  Will treat with this and have her follow-up with PCP.   She has no signs or symptoms of syncope/near syncope.  However her QTc is  prolonged into the  mid 500s.  She is on amiodarone and I discussed with Dr. Tresa Endo, it would be reasonable to cut in half to 100 mg a day given she has not had any recent A-fib episodes.  Can follow-up with Dr. Rennis Golden, her cardiologist.  Patient and daughter are aware of these changes.  Discharged home with return precautions.        Final Clinical Impression(s) / ED Diagnoses Final diagnoses:  Generalized weakness  Prolonged Q-T interval on ECG    Rx / DC Orders ED Discharge Orders          Ordered    cephALEXin (KEFLEX) 500 MG capsule  2 times daily        05/25/23 1551    Home Health        05/25/23 1551    Face-to-face encounter (required for Medicare/Medicaid patients)       Comments: I Audree Camel certify that this patient is under my care and that I, or a nurse practitioner or physician's assistant working with me, had a face-to-face encounter that meets the physician face-to-face encounter requirements with this patient on 05/25/2023. The encounter with the patient was in whole, or in part for the following medical condition(s) which is the primary reason for home health care (List medical condition): leg weakness   05/25/23 1551    Ambulatory referral to Cardiology       Comments: If you have not heard from the Cardiology office within the next 72 hours please call [phone].   05/25/23 1552              Pricilla Loveless, MD 05/25/23 1558

## 2023-05-25 NOTE — ED Notes (Signed)
Pt ambulated with a walker with a steady gait and without assistance in hallways and back to bed safely.

## 2023-05-27 LAB — URINE CULTURE

## 2023-06-01 ENCOUNTER — Telehealth: Payer: Self-pay

## 2023-06-01 NOTE — Telephone Encounter (Signed)
Transition Care Management Unsuccessful Follow-up Telephone Call  Date of discharge and from where:  05/25/2023 [facility]  Attempts:  1st Attempt  Reason for unsuccessful TCM follow-up call:  No answer/busy  Coltyn Hanning [facility]  [facility] Population Health Community Resource Care Guide   [email or web address]  ?? [phone]   Website: [email or web address]  [email or web address]

## 2023-06-01 NOTE — Telephone Encounter (Signed)
Transition Care Management Follow-up Telephone Call Date of discharge and from where: 05/25/2023 [facility] How have you been since you were released from the hospital? Patient stated she is feeling ok not great, her legs are still weak. Any questions or concerns? No  Items Reviewed: Did the pt receive and understand the discharge instructions provided? Yes  Medications obtained and verified? Yes  Other? Gave patient Scientist, product/process development for [employer] for [employer] assistance. Also mailed information to home address. Any new allergies since your discharge? No  Dietary orders reviewed? Yes Do you have support at home? Yes   Follow up appointments reviewed:  PCP Hospital f/u appt confirmed? No  Scheduled to see  on  @ . Specialist Hospital f/u appt confirmed? Yes  Scheduled to see Judieth Keens, DO on 06/03/2023 @ [facility]. Are transportation arrangements needed? No  If their condition worsens, is the pt aware to call PCP or go to the Emergency Dept.? Yes Was the patient provided with contact information for the PCP's office or ED? Yes Was to pt encouraged to call back with questions or concerns? Yes  Kiahna Banghart [facility]  [facility] Population Health Community Resource Care Guide   [email or web address]  ?? [phone]   Website: [email or web address]

## 2023-06-03 DIAGNOSIS — E1065 Type 1 diabetes mellitus with hyperglycemia: Secondary | ICD-10-CM | POA: Diagnosis not present

## 2023-06-03 DIAGNOSIS — N183 Chronic kidney disease, stage 3 unspecified: Secondary | ICD-10-CM | POA: Diagnosis not present

## 2023-06-03 DIAGNOSIS — E1022 Type 1 diabetes mellitus with diabetic chronic kidney disease: Secondary | ICD-10-CM | POA: Diagnosis not present

## 2023-06-03 DIAGNOSIS — E890 Postprocedural hypothyroidism: Secondary | ICD-10-CM | POA: Diagnosis not present

## 2023-06-07 DIAGNOSIS — E039 Hypothyroidism, unspecified: Secondary | ICD-10-CM | POA: Diagnosis not present

## 2023-06-07 DIAGNOSIS — I480 Paroxysmal atrial fibrillation: Secondary | ICD-10-CM | POA: Diagnosis not present

## 2023-06-07 DIAGNOSIS — E118 Type 2 diabetes mellitus with unspecified complications: Secondary | ICD-10-CM | POA: Diagnosis not present

## 2023-06-07 DIAGNOSIS — E785 Hyperlipidemia, unspecified: Secondary | ICD-10-CM | POA: Diagnosis not present

## 2023-06-10 LAB — LAB REPORT - SCANNED: EGFR: 51

## 2023-06-14 ENCOUNTER — Ambulatory Visit: Payer: Self-pay

## 2023-06-14 NOTE — Patient Instructions (Signed)
Visit Information  Thank you for taking time to visit with me today. Please don't hesitate to contact me if I can be of assistance to you.   Following are the goals we discussed today:   Goals Addressed             This Visit's Progress    Diabetes Management       Patient Goals/Self Care Activities: -Patient/Caregiver will self-administer medications as prescribed as evidenced by self-report/primary caregiver report  -Patient/Caregiver will attend all scheduled provider appointments as evidenced by clinician review of documented attendance to scheduled appointments and patient/caregiver report -Patient/Caregiver will call provider office for new concerns or questions as evidenced by review of documented incoming telephone call notes and patient report  -check blood sugar at prescribed times -check blood sugar if I feel it is too high or too low -record values and write them down take them to all doctor visits  -Discussed low carbohydrate diet and limiting sweets   Patient reports she is doing good.  Saw endocrinologist recently.  Trulicity increased last blood sugar 151.  Discussed diabetes management.  She verbalized understanding.  She still has some weakness.  Patient to begin outpatient therapy. No concerns.            Our next appointment is by telephone on 07/12/23 at 1100 am  Please call the care guide team at [phone] if you need to cancel or reschedule your appointment.   If you are experiencing a Mental Health or Behavioral Health Crisis or need someone to talk to, please call the Suicide and Crisis Lifeline: 988   Patient verbalizes understanding of instructions and care plan provided today and agrees to view in MyChart. Active MyChart status and patient understanding of how to access instructions and care plan via MyChart confirmed with patient.     The patient has been provided with contact information for the care management team and has been advised to call  with any health related questions or concerns.   Bary Leriche, RN, MSN [facility] Management Community Coordinator Direct Dial: [phone]  Fax: [phone] Website: [email or web address]

## 2023-06-14 NOTE — Progress Notes (Signed)
Cardiology Clinic Note   Date: 06/15/2023 ID: [name] [id number], MRN [id number]  Primary Cardiologist:  Chrystie Nose, MD  Patient Profile    [name] is a 88 y.o. female who presents to the clinic today for evaluation of lower extremity edema.     Past medical history significant for: PAF. Echo 09/01/2021: EF 65 to 70%.  Mild LVH.  Mildly reduced RV function.  Mild LAE.  The atrial septum bows right suggesting LA pressure overload.  Aortic valve sclerosis/calcification without stenosis. Hypertension. Hyperlipidemia. Lipid panel 05/04/2022: LDL 108, HDL 57, TG 60, total 177. IDDM. CKD stage III. Anemia.      History of Present Illness    [name]  is a longtime patient of Dr. Rennis Golden.  She was first evaluated for A-fib during hospital admission for syncope.  She converted to sinus rhythm on IV amiodarone and was transitioned to p.o. amiodarone.  She continues to be followed by Dr. Rennis Golden for the above outlined history.   Patient was last seen in the office by me on 03/24/2023 for routine follow up and pre-op evaluation. She was doing well at that time. She continued to work 9 hours a week at [employer] center. She was cleared to undergo TURBT which was performed on 04/04/2023.  Patient with 2 ED visits since surgery: 04/06/2023 presented to ED with complaints of generalized weakness worse since biopsy. Patient also with hyperglycemia. She was being treated for UTI with Cipro with concern for resistance. She was changed to Keflex and discharged.  05/25/2023 presented to ED for weakness. She reported continued hematuria. EKG showed prolonged QT >500. Discussed with Dr. Tresa Endo who recommended decreasing amiodarone to 100 mg daily. Patient was discharged on abx for UTI. Home PT ordered.   Today, patient is here alone. She reports weakness is slowly improving. She denies recent falls. She has been referred to PT and is looking forward to getting started. She continues to  work about 9 hours a week. She reports noticing increased edema in bilateral feet/ankles R>L over the last few weeks. She does admit to dietary indiscretion with sodium recently eating out a few times a week. She reports feet are normal in the morning and increases throughout the day particularly if she cannot elevate her feet. She has lost some weight since her biopsy and is she is trying to gain it back. She denies shortness of breath or DOE.      ROS: All other systems reviewed and are otherwise negative except as noted in History of Present Illness.  Studies Reviewed    EKG Interpretation Date/Time:  Wednesday June 15 2023 09:02:01 EDT Ventricular Rate:  70 PR Interval:  202 QRS Duration:  98 QT Interval:  498 QTC Calculation: 537 R Axis:   -32  Text Interpretation: Normal sinus rhythm Left axis deviation Moderate voltage criteria for LVH, may be normal variant ( R in aVL , Cornell product ) Nonspecific T wave abnormality Prolonged QT When compared with ECG of 25-May-2023 14:34, PREVIOUS ECG IS PRESENT Confirmed by Carlos Levering [phone]) on 06/15/2023 9:12:32 AM   Risk Assessment/Calculations     CHA2DS2-VASc Score = 5   This indicates a 7.2% annual risk of stroke. The patient's score is based upon: CHF History: 0 HTN History: 1 Diabetes History: 1 Stroke History: 0 Vascular Disease History: 0 Age Score: 2 Gender Score: 1             Physical Exam    VS:  BP 120/60   Pulse 70   Ht 5\' 2"  (1.575 m)   Wt 127 lb (57.6 kg)   SpO2 99%   BMI 23.23 kg/m  , BMI Body mass index is 23.23 kg/m.  GEN: Well nourished, well developed, in no acute distress. Neck: No JVD or carotid bruits. Cardiac:  RRR. No murmurs. No rubs or gallops.   Respiratory:  Respirations regular and unlabored. Clear to auscultation without rales, wheezing or rhonchi. GI: Soft, nontender, nondistended. Extremities: Radials/DP/PT 2+ and equal bilaterally. No clubbing or cyanosis. Mild nonpitting  edema bilateral ankles R>L.   Skin: Warm and dry, no rash. Neuro: Strength intact.  Assessment & Plan    Lower extremity edema. Increased edema bilateral feet/ankles in the last several weeks. Feet/ankles are normal in the morning and increase throughout the day. She admits to dietary indiscretion with sodium eating out several times a week. She denies shortness of breath or DOE. She has mild, nonpitting edema today. Discussed restricting sodium and utilizing compression socks and elevation. She will continue to monitor and contact the office if it worsens.  PAF.  Patient denies palpitations.  Amiodarone decreased to 100 mg daily secondary to QT >500. She denies spontaneous bleeding concerns. EKG today shows NSR, QTc 537 (slightly decreased from 8/14). Discussed with Dr. Rennis Golden who recommends continuing amiodarone 100 mg. Continue diltiazem, Eliquis.  TSH May 2024 was normal at 4.867.  ALT normal at 34 and AST slightly elevated at 45 April 2024. Appropriate Eliquis dose.   Hypertension. BP today 120/60. Patient denies headaches, dizziness or vision changes. Continue diltiazem.  Hyperlipidemia.  LDL July 2023 108.  Continue rosuvastatin. Followed by PCP.   Disposition: Return in 6 months or sooner as needed.          Signed, Etta Grandchild. Jakhia Buxton, DNP, NP-C

## 2023-06-15 ENCOUNTER — Ambulatory Visit: Payer: Medicare PPO | Attending: Student | Admitting: Student

## 2023-06-15 ENCOUNTER — Encounter: Payer: Self-pay | Admitting: Student

## 2023-06-15 VITALS — BP 120/60 | HR 70 | Ht 62.0 in | Wt 127.0 lb

## 2023-06-15 DIAGNOSIS — I480 Paroxysmal atrial fibrillation: Secondary | ICD-10-CM | POA: Diagnosis not present

## 2023-06-15 DIAGNOSIS — R600 Localized edema: Secondary | ICD-10-CM

## 2023-06-15 DIAGNOSIS — E7800 Pure hypercholesterolemia, unspecified: Secondary | ICD-10-CM

## 2023-06-15 DIAGNOSIS — I10 Essential (primary) hypertension: Secondary | ICD-10-CM

## 2023-06-15 MED ORDER — AMIODARONE HCL 100 MG PO TABS: 100.0000 mg | ORAL_TABLET | Freq: Every day | ORAL | 3 refills | Status: DC

## 2023-06-15 NOTE — Patient Instructions (Addendum)
Medication Instructions:  DECREASE AMIODARONE TO 100MG  DAILY. TAKE ONE TABLET DAILY. *If you need a refill on your cardiac medications before your next appointment, please call your pharmacy*   Lab Work: If you have labs (blood work) drawn today and your tests are completely normal, you will receive your results only by: MyChart Message (if you have MyChart) OR A paper copy in the mail If you have any lab test that is abnormal or we need to change your treatment, we will call you to review the results.  Follow-Up: At [facility], you and your health needs are our priority.  As part of our continuing mission to provide you with exceptional heart care, we have created designated Provider Care Teams.  These Care Teams include your primary Cardiologist (physician) and Advanced Practice Providers (APPs -  Physician Assistants and Nurse Practitioners) who all work together to provide you with the care you need, when you need it.  We recommend signing up for the patient portal called "MyChart".  Sign up information is provided on this After Visit Summary.  MyChart is used to connect with patients for Virtual Visits (Telemedicine).  Patients are able to view lab/test results, encounter notes, upcoming appointments, etc.  Non-urgent messages can be sent to your provider as well.   To learn more about what you can do with MyChart, go to [email or web address].    Your next appointment:   6 month(s) a letter will be mailed to you as a reminder to call the office for your 6 month appointment.  Provider:   Chrystie Nose, MD      Remember to get the debrox for ear wax

## 2023-06-29 DIAGNOSIS — R2689 Other abnormalities of gait and mobility: Secondary | ICD-10-CM | POA: Diagnosis not present

## 2023-07-04 ENCOUNTER — Ambulatory Visit (HOSPITAL_COMMUNITY)
Admission: EM | Admit: 2023-07-04 | Discharge: 2023-07-04 | Disposition: A | Discharge status: Home / Self Care | Payer: Medicare PPO

## 2023-07-04 ENCOUNTER — Encounter (HOSPITAL_COMMUNITY): Payer: Self-pay

## 2023-07-04 DIAGNOSIS — Z7901 Long term (current) use of anticoagulants: Secondary | ICD-10-CM | POA: Diagnosis not present

## 2023-07-04 DIAGNOSIS — E559 Vitamin D deficiency, unspecified: Secondary | ICD-10-CM | POA: Diagnosis not present

## 2023-07-04 DIAGNOSIS — Z0000 Encounter for general adult medical examination without abnormal findings: Secondary | ICD-10-CM | POA: Diagnosis not present

## 2023-07-04 DIAGNOSIS — Z23 Encounter for immunization: Secondary | ICD-10-CM | POA: Diagnosis not present

## 2023-07-04 DIAGNOSIS — H6121 Impacted cerumen, right ear: Secondary | ICD-10-CM

## 2023-07-04 DIAGNOSIS — R3129 Other microscopic hematuria: Secondary | ICD-10-CM | POA: Diagnosis not present

## 2023-07-04 DIAGNOSIS — I10 Essential (primary) hypertension: Secondary | ICD-10-CM | POA: Diagnosis not present

## 2023-07-04 NOTE — ED Provider Notes (Signed)
[facility]    CSN: [id number] Arrival date & time: 07/04/23  1435      History   Chief Complaint Chief Complaint  Patient presents with   Ear Fullness    HPI Norberta Cromartie is a 88 y.o. female.   Patient presents with right ear fullness x 2 weeks.  Denies dizziness, ear drainage, bleeding, headaches, and fever. Patient states she had an appointment with her primary care doctor today for ear irrigation, but was late to her appointment and they would not see her.   Ear Fullness Pertinent negatives include no headaches.    Past Medical History:  Diagnosis Date   Anemia    Arthritis    Colon polyp    Diabetes mellitus    type 1   Heart murmur    Hyperlipidemia    Hypertension    Hypothyroidism    Pneumonia    Poor appetite    Thyroid disease     Patient Active Problem List   Diagnosis Date Noted   Acquired hypercoagulable state (HCC) 04/06/2023   Fatty liver 04/06/2023   Malignant neoplasm of urinary bladder (HCC) 04/06/2023   Fall at home, subsequent encounter 05/03/2022   Unsteady gait 05/03/2022   Scalp laceration 05/03/2022   First degree AV block 05/03/2022   Occult blood in stools    Rectal ulcer    Anemia 02/05/2022   Stage 3b chronic kidney disease (CKD) (HCC) 01/27/2022   Atrial fibrillation with rapid ventricular response (HCC) 01/25/2022   Elevated transaminase level 01/25/2022   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   Hypokalemia 09/02/2021   Acute renal failure superimposed on stage 3a chronic kidney disease (HCC) 09/01/2021   COVID-19 virus infection 03/13/2021   DKA (diabetic ketoacidosis) (HCC) 03/13/2021   Hyperglycemia 03/23/2020   LADA (latent autoimmune diabetes in adults), 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   Hypercholesterolemia 03/15/2018   Left hip pain 06/07/2017   Hypothyroidism 02/03/2015   Essential hypertension 07/10/2013   Uncontrolled type 2 diabetes mellitus with hyperglycemia, with long-term current use of  insulin (HCC) 07/10/2013   Upper respiratory infection 07/10/2013   PAF (paroxysmal atrial fibrillation) (HCC) 12/26/2012   Postop check 04/13/2011    Past Surgical History:  Procedure Laterality Date   ABDOMINAL HYSTERECTOMY     APPENDECTOMY     BIOPSY  02/07/2022   Procedure: BIOPSY;  Surgeon: Meryl Dare, MD;  Location: [facility] ENDOSCOPY;  Service: Gastroenterology;;   BREAST EXCISIONAL BIOPSY Right    CARDIOVASCULAR STRESS TEST  07/15/2006   Normal scan, no ECG changes   COLONOSCOPY WITH PROPOFOL N/A 02/07/2022   Procedure: COLONOSCOPY WITH PROPOFOL;  Surgeon: Meryl Dare, MD;  Location: [facility] ENDOSCOPY;  Service: Gastroenterology;  Laterality: N/A;   CYSTOSCOPY WITH BIOPSY Bilateral 11/19/2022   Procedure: CYSTOSCOPY WITH TRANSURETHRAL RESECTION OF BLADDER TUMOR BILATERAL RETROGRADE PYELOGRAM;  Surgeon: Crista Elliot, MD;  Location: [facility] ORS;  Service: Urology;  Laterality: Bilateral;  1 HR FOR CASE   ESOPHAGOGASTRODUODENOSCOPY (EGD) WITH PROPOFOL N/A 02/07/2022   Procedure: ESOPHAGOGASTRODUODENOSCOPY (EGD) WITH PROPOFOL;  Surgeon: Meryl Dare, MD;  Location: [facility] ENDOSCOPY;  Service: Gastroenterology;  Laterality: N/A;   TRANSTHORACIC ECHOCARDIOGRAM  01/08/2011   EF 60-65%, moderae mitral regurg, LA mild-moderately dilated,   TRANSURETHRAL RESECTION OF BLADDER TUMOR N/A 04/04/2023   Procedure: TRANSURETHRAL RESECTION OF BLADDER TUMOR (TURBT);  Surgeon: Crista Elliot, MD;  Location: [facility] OR[facility];  Service: Urology;  Laterality: N/A;  1 HR FOR CASE    OB  History   No obstetric history on file.      Home Medications    Prior to Admission medications   Medication Sig Start Date End Date Taking? Authorizing Provider  amiodarone (PACERONE) 100 MG tablet Take 1 tablet (100 mg total) by mouth daily. 06/15/23   Carlos Levering, NP  apixaban (ELIQUIS) 2.5 MG TABS tablet TAKE 1 TABLET BY MOUTH TWICE A DAY 02/06/23   Hilty, Lisette Abu, MD  Ascorbic Acid (VITAMIN C) 100 MG tablet  Take 100 mg by mouth daily.    [provider]  calcium carbonate (TUMS - DOSED IN MG ELEMENTAL CALCIUM) 500 MG chewable tablet Chew 1 tablet by mouth daily.    [provider]  FeFum-FePoly-FA-B Cmp-C-Biot (FOLIVANE-PLUS) CAPS TAKE 1 CAPSULE BY MOUTH EVERY DAY IN THE MORNING 05/02/23   Heilingoetter, Cassandra L, PA-C  furosemide (LASIX) 20 MG tablet Take 1 tablet (20 mg total) by mouth daily as needed for fluid or edema. 03/24/23   Carlos Levering, NP  HYDROcodone-acetaminophen (NORCO) 5-325 MG tablet Take 1 tablet by mouth every 4 (four) hours as needed for moderate pain. 11/19/22   Ray Church III, MD  insulin aspart (NOVOLOG FLEXPEN) 100 UNIT/ML FlexPen Inject 8 Units into the skin 3 (three) times daily with meals. Patient taking differently: Inject 12 Units into the skin 3 (three) times daily with meals. 09/04/21   Narda Bonds, MD  insulin glargine, 2 Unit Dial, (TOUJEO MAX SOLOSTAR) 300 UNIT/ML Solostar Pen Inject 14 Units into the skin at bedtime. Patient taking differently: Inject 18 Units into the skin at bedtime. 05/04/22   Almon Hercules, MD  KLOR-CON M20 20 MEQ tablet Take 20 mEq by mouth daily. 03/22/23   [provider]  levothyroxine (SYNTHROID) 88 MCG tablet Take 1 tablet (88 mcg total) by mouth daily before breakfast. 02/08/22   Osvaldo Shipper, MD  mirtazapine (REMERON) 15 MG tablet Take 15 mg by mouth at bedtime. 03/12/23   [provider]  pantoprazole (PROTONIX) 40 MG tablet Take 1 tablet (40 mg total) by mouth daily at 6 (six) AM. 02/09/22   Osvaldo Shipper, MD  polyethylene glycol (MIRALAX / GLYCOLAX) 17 g packet Take 17 g by mouth daily. Patient taking differently: Take 17 g by mouth daily as needed for moderate constipation. 02/09/22   Osvaldo Shipper, MD  rosuvastatin (CRESTOR) 40 MG tablet Take 40 mg by mouth daily. 02/06/23   [provider]  senna-docusate (SENOKOT-S) 8.6-50 MG tablet Take 2 tablets by mouth at bedtime. 02/08/22    Osvaldo Shipper, MD  TIADYLT ER 180 MG 24 hr capsule TAKE 1 CAPSULE BY MOUTH EVERY DAY 11/01/22   Hilty, Lisette Abu, MD  trimethoprim (TRIMPEX) 100 MG tablet Take 100 mg by mouth daily. 10/27/22   [provider]  TRULICITY 1.5 MG/0.5ML SOPN Inject 1.5 mg into the skin every 7 (seven) days.    [provider]    Family History Family History  Problem Relation Age of Onset   Diabetes Mother    Hypertension Father    Hyperlipidemia Father    Diabetes Sister    Hypertension Sister    Hyperlipidemia Sister    Hypertension Brother    Stroke Sister    Hypertension Sister    Diabetes Sister    Cancer Daughter    Heart failure Child    Breast cancer Neg Hx     Social History Social History   Tobacco Use   Smoking status: Former  Types: Cigarettes   Smokeless tobacco: Never   Tobacco comments:    Never a heavy smoker  I pack lasted 3  days  Vaping Use   Vaping status: Never Used  Substance Use Topics   Alcohol use: No   Drug use: No     Allergies   Patient has no known allergies.   Review of Systems Review of Systems  Constitutional:  Negative for fever.  HENT:  Positive for ear pain. Negative for ear discharge.   Neurological:  Negative for dizziness and headaches.     Physical Exam Triage Vital Signs ED Triage Vitals  Encounter Vitals Group     BP 07/04/23 1551 120/68     Systolic BP Percentile --      Diastolic BP Percentile --      Pulse Rate 07/04/23 1551 66     Resp 07/04/23 1551 18     Temp 07/04/23 1551 97.6 F (36.4 C)     Temp Source 07/04/23 1551 Oral     SpO2 07/04/23 1551 94 %     Weight 07/04/23 1550 121 lb (54.9 kg)     Height --      Head Circumference --      Peak Flow --      Pain Score 07/04/23 1550 0     Pain Loc --      Pain Education --      Exclude from Growth Chart --    No data found.  Updated Vital Signs BP 120/68 (BP Location: Right Arm)   Pulse 66   Temp 97.6 F (36.4 C) (Oral)   Resp 18   Wt 121 lb  (54.9 kg)   SpO2 94%   BMI 22.13 kg/m   Visual Acuity Right Eye Distance:   Left Eye Distance:   Bilateral Distance:    Right Eye Near:   Left Eye Near:    Bilateral Near:     Physical Exam Vitals and nursing note reviewed.  Constitutional:      General: She is awake. She is not in acute distress.    Appearance: Normal appearance. She is well-developed and well-groomed. She is not ill-appearing, toxic-appearing or diaphoretic.  HENT:     Right Ear: There is impacted cerumen.     Left Ear: Tympanic membrane, ear canal and external ear normal.  Neurological:     General: No focal deficit present.     Mental Status: She is alert and oriented to person, place, and time.  Psychiatric:        Behavior: Behavior is cooperative.      UC Treatments / Results  Labs (all labs ordered are listed, but only abnormal results are displayed) Labs Reviewed - No data to display  EKG   Radiology No results found.  Procedures Procedures (including critical care time)  Medications Ordered in UC Medications - No data to display  Initial Impression / Assessment and Plan / UC Course  I have reviewed the triage vital signs and the nursing notes.  Pertinent labs & imaging results that were available during my care of the patient were reviewed by me and considered in my medical decision making (see chart for details).     Patient presented with 2-week history of right ear fullness.  Denies dizziness, ear drainage, bleeding, headaches, and fever.  States she was late to an appointment with her PCP today so they would not see her.  Upon assessment there is impacted cerumen in the right  ear.  Upon reassessment after irrigation there were no obvious signs of infection.  Discussed follow-up and return precautions. Final Clinical Impressions(s) / UC Diagnoses   Final diagnoses:  Impacted cerumen of right ear     Discharge Instructions      Follow-up with primary care doctor or return  here as needed.     ED Prescriptions   None    PDMP not reviewed this encounter.   Wynonia Lawman A, NP 07/04/23 [phone]

## 2023-07-04 NOTE — Discharge Instructions (Signed)
Follow-up with primary care doctor or return here as needed.

## 2023-07-04 NOTE — ED Triage Notes (Addendum)
Right ear fullness. X 2 weeks. Went to the Dr this morning and they told her it was wax. When she touches her ear it clears up.

## 2023-07-12 ENCOUNTER — Other Ambulatory Visit: Payer: Self-pay

## 2023-07-12 ENCOUNTER — Encounter (HOSPITAL_BASED_OUTPATIENT_CLINIC_OR_DEPARTMENT_OTHER): Payer: Self-pay

## 2023-07-12 ENCOUNTER — Ambulatory Visit: Payer: Self-pay

## 2023-07-12 ENCOUNTER — Emergency Department (HOSPITAL_BASED_OUTPATIENT_CLINIC_OR_DEPARTMENT_OTHER)
Admission: EM | Admit: 2023-07-12 | Discharge: 2023-07-13 | Disposition: A | Discharge status: Home / Self Care | Payer: Medicare PPO | Attending: Emergency Medicine | Admitting: Emergency Medicine

## 2023-07-12 DIAGNOSIS — Z9049 Acquired absence of other specified parts of digestive tract: Secondary | ICD-10-CM | POA: Diagnosis not present

## 2023-07-12 DIAGNOSIS — R531 Weakness: Secondary | ICD-10-CM | POA: Diagnosis not present

## 2023-07-12 DIAGNOSIS — N3001 Acute cystitis with hematuria: Secondary | ICD-10-CM | POA: Diagnosis not present

## 2023-07-12 DIAGNOSIS — E039 Hypothyroidism, unspecified: Secondary | ICD-10-CM | POA: Insufficient documentation

## 2023-07-12 DIAGNOSIS — Z794 Long term (current) use of insulin: Secondary | ICD-10-CM | POA: Insufficient documentation

## 2023-07-12 DIAGNOSIS — I129 Hypertensive chronic kidney disease with stage 1 through stage 4 chronic kidney disease, or unspecified chronic kidney disease: Secondary | ICD-10-CM | POA: Insufficient documentation

## 2023-07-12 DIAGNOSIS — E1022 Type 1 diabetes mellitus with diabetic chronic kidney disease: Secondary | ICD-10-CM | POA: Diagnosis not present

## 2023-07-12 DIAGNOSIS — Z7901 Long term (current) use of anticoagulants: Secondary | ICD-10-CM | POA: Diagnosis not present

## 2023-07-12 DIAGNOSIS — N1832 Chronic kidney disease, stage 3b: Secondary | ICD-10-CM | POA: Diagnosis not present

## 2023-07-12 DIAGNOSIS — R29898 Other symptoms and signs involving the musculoskeletal system: Secondary | ICD-10-CM | POA: Diagnosis not present

## 2023-07-12 DIAGNOSIS — N3289 Other specified disorders of bladder: Secondary | ICD-10-CM | POA: Diagnosis not present

## 2023-07-12 DIAGNOSIS — I6381 Other cerebral infarction due to occlusion or stenosis of small artery: Secondary | ICD-10-CM | POA: Diagnosis not present

## 2023-07-12 LAB — CBC WITH DIFFERENTIAL/PLATELET
Abs Immature Granulocytes: 0.03 10*3/uL (ref 0.00–0.07)
Basophils Absolute: 0.1 10*3/uL (ref 0.0–0.1)
Basophils Relative: 1 %
Eosinophils Absolute: 0 10*3/uL (ref 0.0–0.5)
Eosinophils Relative: 1 %
HCT: 32.4 % — ABNORMAL LOW (ref 36.0–46.0)
Hemoglobin: 10.5 g/dL — ABNORMAL LOW (ref 12.0–15.0)
Immature Granulocytes: 0 %
Lymphocytes Relative: 11 %
Lymphs Abs: 0.8 10*3/uL (ref 0.7–4.0)
MCH: 28 pg (ref 26.0–34.0)
MCHC: 32.4 g/dL (ref 30.0–36.0)
MCV: 86.4 fL (ref 80.0–100.0)
Monocytes Absolute: 0.8 10*3/uL (ref 0.1–1.0)
Monocytes Relative: 10 %
Neutro Abs: 6 10*3/uL (ref 1.7–7.7)
Neutrophils Relative %: 77 %
Platelets: 290 10*3/uL (ref 150–400)
RBC: 3.75 MIL/uL — ABNORMAL LOW (ref 3.87–5.11)
RDW: 14.4 % (ref 11.5–15.5)
WBC: 7.7 10*3/uL (ref 4.0–10.5)
nRBC: 0 % (ref 0.0–0.2)

## 2023-07-12 LAB — COMPREHENSIVE METABOLIC PANEL
ALT: 50 U/L — ABNORMAL HIGH (ref 0–44)
AST: 35 U/L (ref 15–41)
Albumin: 3.1 g/dL — ABNORMAL LOW (ref 3.5–5.0)
Alkaline Phosphatase: 96 U/L (ref 38–126)
Anion gap: 14 (ref 5–15)
BUN: 35 mg/dL — ABNORMAL HIGH (ref 8–23)
CO2: 24 mmol/L (ref 22–32)
Calcium: 8.6 mg/dL — ABNORMAL LOW (ref 8.9–10.3)
Chloride: 92 mmol/L — ABNORMAL LOW (ref 98–111)
Creatinine, Ser: 1.71 mg/dL — ABNORMAL HIGH (ref 0.44–1.00)
GFR, Estimated: 29 mL/min — ABNORMAL LOW (ref >60)
Glucose, Bld: 324 mg/dL — ABNORMAL HIGH (ref 70–99)
Potassium: 4 mmol/L (ref 3.5–5.1)
Sodium: 130 mmol/L — ABNORMAL LOW (ref 135–145)
Total Bilirubin: 0.9 mg/dL (ref 0.3–1.2)
Total Protein: 6.7 g/dL (ref 6.5–8.1)

## 2023-07-12 LAB — CBG MONITORING, ED: Glucose-Capillary: 52 mg/dL — ABNORMAL LOW (ref 70–99)

## 2023-07-12 MED ORDER — SODIUM CHLORIDE 0.9 % IV BOLUS
1000.0000 mL | Freq: Once | INTRAVENOUS | Status: AC
  Administered 2023-07-13: 1000 mL via INTRAVENOUS

## 2023-07-12 NOTE — Progress Notes (Signed)
RT unable to obtain IV access.  Blood sent to lab.

## 2023-07-12 NOTE — ED Provider Notes (Incomplete)
[place] [facility] Provider Note  CSN: [id number] Arrival date & time: 07/12/23 2005  Chief Complaint(s) Weakness  HPI [name] is a 88 y.o. female with past medical history as below, significant for HLD, HTN, hypothyroid, DM1 who presents to the ED with complaint of weakness.  She is here with daughter, reports progressive weakness over the past 2 to 3 months.  She was seen by PCP recently and her thyroid medication was adjusted.  She reports weakness into her bilateral lower extremities, worsened with exertion.  She will ambulate a few steps then her legs will feel tired and she sits down and rest.  She has not experienced difficulty breathing, chest pain or palpitations, nausea or vomiting with the symptoms.  No lightheadedness or dizziness or near syncope.  Other than thyroid medication change, no recent medication adjustments.  No change to p.o. intake.  No change with bowel or bladder function.  No headaches, numbness or tingling.  No recent falls.  No sick contacts or recent travel.  Past Medical History Past Medical History:  Diagnosis Date   Anemia    Arthritis    Colon polyp    Diabetes mellitus    type 1   Heart murmur    Hyperlipidemia    Hypertension    Hypothyroidism    Pneumonia    Poor appetite    Thyroid disease    Patient Active Problem List   Diagnosis Date Noted   Acquired hypercoagulable state (HCC) 04/06/2023   Fatty liver 04/06/2023   Malignant neoplasm of urinary bladder (HCC) 04/06/2023   Fall at home, subsequent encounter 05/03/2022   Unsteady gait 05/03/2022   Scalp laceration 05/03/2022   First degree AV block 05/03/2022   Occult blood in stools    Rectal ulcer    Anemia 02/05/2022   Stage 3b chronic kidney disease (CKD) (HCC) 01/27/2022   Atrial fibrillation with rapid ventricular response (HCC) 01/25/2022   Elevated transaminase level 01/25/2022   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   Hypokalemia  09/02/2021   Acute renal failure superimposed on stage 3a chronic kidney disease (HCC) 09/01/2021   COVID-19 virus infection 03/13/2021   DKA (diabetic ketoacidosis) (HCC) 03/13/2021   Hyperglycemia 03/23/2020   LADA (latent autoimmune diabetes in adults), 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 Authorizing Provider  amiodarone (PACERONE) 100 MG tablet Take 1 tablet (100 mg total) by mouth daily. 06/15/23   Carlos Levering, NP  apixaban (ELIQUIS) 2.5 MG TABS tablet TAKE 1 TABLET BY MOUTH TWICE A DAY 02/06/23   Hilty, Lisette Abu, MD  Ascorbic Acid (VITAMIN C) 100 MG tablet Take 100 mg by mouth daily.    [provider]  calcium carbonate (TUMS - DOSED IN MG ELEMENTAL CALCIUM) 500 MG chewable tablet Chew 1 tablet by mouth daily.    [provider]  FeFum-FePoly-FA-B Cmp-C-Biot (FOLIVANE-PLUS) CAPS TAKE 1 CAPSULE BY MOUTH EVERY DAY IN THE MORNING 05/02/23   Heilingoetter, Cassandra L, PA-C  furosemide (LASIX) 20 MG tablet Take 1 tablet (20 mg total) by mouth daily as needed for fluid  or edema. 03/24/23   Carlos Levering, NP  HYDROcodone-acetaminophen (NORCO) 5-325 MG tablet Take 1 tablet by mouth every 4 (four) hours as needed for moderate pain. 11/19/22   Ray Church III, MD  insulin aspart (NOVOLOG FLEXPEN) 100 UNIT/ML FlexPen Inject 8 Units into the skin 3 (three) times daily with meals. Patient taking differently: Inject 12 Units into the skin 3 (three) times daily with meals. 09/04/21   Narda Bonds, MD  insulin  glargine, 2 Unit Dial, (TOUJEO MAX SOLOSTAR) 300 UNIT/ML Solostar Pen Inject 14 Units into the skin at bedtime. Patient taking differently: Inject 18 Units into the skin at bedtime. 05/04/22   Almon Hercules, MD  KLOR-CON M20 20 MEQ tablet Take 20 mEq by mouth daily. 03/22/23   [provider]  levothyroxine (SYNTHROID) 88 MCG tablet Take 1 tablet (88 mcg total) by mouth daily before breakfast. 02/08/22   Osvaldo Shipper, MD  mirtazapine (REMERON) 15 MG tablet Take 15 mg by mouth at bedtime. 03/12/23   [provider]  pantoprazole (PROTONIX) 40 MG tablet Take 1 tablet (40 mg total) by mouth daily at 6 (six) AM. 02/09/22   Osvaldo Shipper, MD  polyethylene glycol (MIRALAX / GLYCOLAX) 17 g packet Take 17 g by mouth daily. Patient taking differently: Take 17 g by mouth daily as needed for moderate constipation. 02/09/22   Osvaldo Shipper, MD  rosuvastatin (CRESTOR) 40 MG tablet Take 40 mg by mouth daily. 02/06/23   [provider]  senna-docusate (SENOKOT-S) 8.6-50 MG tablet Take 2 tablets by mouth at bedtime. 02/08/22   Osvaldo Shipper, MD  TIADYLT ER 180 MG 24 hr capsule TAKE 1 CAPSULE BY MOUTH EVERY DAY 11/01/22   Hilty, Lisette Abu, MD  trimethoprim (TRIMPEX) 100 MG tablet Take 100 mg by mouth daily. 10/27/22   [provider]  TRULICITY 1.5 MG/0.5ML SOPN Inject 1.5 mg into the skin every 7 (seven) days.    [provider]                                                                                                                                    Past Surgical History Past Surgical History:  Procedure Laterality Date   ABDOMINAL HYSTERECTOMY     APPENDECTOMY     BIOPSY  02/07/2022   Procedure: BIOPSY;  Surgeon: Meryl Dare, MD;  Location: [facility] ENDOSCOPY;  Service: Gastroenterology;;   BREAST EXCISIONAL BIOPSY Right    CARDIOVASCULAR STRESS TEST  07/15/2006   Normal scan, no ECG changes   COLONOSCOPY WITH PROPOFOL N/A 02/07/2022   Procedure: COLONOSCOPY  WITH PROPOFOL;  Surgeon: Meryl Dare, MD;  Location: [facility] ENDOSCOPY;  Service: Gastroenterology;  Laterality: N/A;   CYSTOSCOPY WITH BIOPSY Bilateral 11/19/2022   Procedure: CYSTOSCOPY WITH TRANSURETHRAL RESECTION OF BLADDER TUMOR BILATERAL RETROGRADE PYELOGRAM;  Surgeon: Crista Elliot, MD;  Location: [facility] ORS;  Service: Urology;  Laterality: Bilateral;  1  HR FOR CASE   ESOPHAGOGASTRODUODENOSCOPY (EGD) WITH PROPOFOL N/A 02/07/2022   Procedure: ESOPHAGOGASTRODUODENOSCOPY (EGD) WITH PROPOFOL;  Surgeon: Meryl Dare, MD;  Location: [facility] ENDOSCOPY;  Service: Gastroenterology;  Laterality: N/A;   TRANSTHORACIC ECHOCARDIOGRAM  01/08/2011   EF 60-65%, moderae mitral regurg, LA mild-moderately dilated,   TRANSURETHRAL RESECTION OF BLADDER TUMOR N/A 04/04/2023   Procedure: TRANSURETHRAL RESECTION OF BLADDER TUMOR (TURBT);  Surgeon: Crista Elliot, MD;  Location: [facility] ORS;  Service: Urology;  Laterality: N/A;  1 HR FOR CASE   Family History Family History  Problem Relation Age of Onset   Diabetes Mother    Hypertension Father    Hyperlipidemia Father    Diabetes Sister    Hypertension Sister    Hyperlipidemia Sister    Hypertension Brother    Stroke Sister    Hypertension Sister    Diabetes Sister    Cancer Daughter    Heart failure Child    Breast cancer Neg Hx     Social History Social History   Tobacco Use   Smoking status: Former    Types: Cigarettes   Smokeless tobacco: Never   Tobacco comments:    Never a heavy smoker  I pack lasted 3  days  Vaping Use   Vaping status: Never Used  Substance Use Topics   Alcohol use: No   Drug use: No   Allergies Patient has no known allergies.  Review of Systems Review of Systems  Constitutional:  Negative for chills and fever.  Respiratory:  Negative for chest tightness and shortness of breath.   Cardiovascular:  Negative for chest pain, palpitations and leg swelling.  Gastrointestinal:  Negative for abdominal pain, nausea and  vomiting.  Genitourinary:  Negative for dysuria and urgency.  Musculoskeletal:  Negative for arthralgias.  Skin:  Negative for pallor.  Neurological:  Positive for weakness. Negative for numbness and headaches.  All other systems reviewed and are negative.   Physical Exam Vital Signs  I have reviewed the triage vital signs BP (!) 142/59 (BP Location: Left Arm)   Pulse 78   Temp 99 F (37.2 C) (Oral)   Resp 18   SpO2 96%  Physical Exam Vitals and nursing note reviewed.  Constitutional:      General: She is not in acute distress.    Appearance: Normal appearance. She is not ill-appearing.  HENT:     Head: Normocephalic and atraumatic.     Right Ear: External ear normal.     Left Ear: External ear normal.     Nose: Nose normal.     Mouth/Throat:     Mouth: Mucous membranes are moist.  Eyes:     General: No scleral icterus.       Right eye: No discharge.        Left eye: No discharge.     Extraocular Movements: Extraocular movements intact.     Pupils: Pupils are equal, round, and reactive to light.  Cardiovascular:     Rate and Rhythm: Normal rate and regular rhythm.     Pulses: Normal pulses.     Heart sounds: Normal heart sounds.  Pulmonary:     Effort: Pulmonary effort is normal. No respiratory distress.     Breath sounds: Normal breath sounds. No stridor.  Abdominal:     General: Abdomen is flat. There is no distension.     Palpations: Abdomen is soft.     Tenderness: There is no abdominal tenderness.  Musculoskeletal:  Cervical back: No rigidity.     Right lower leg: No edema.     Left lower leg: No edema.  Skin:    General: Skin is warm and dry.     Capillary Refill: Capillary refill takes less than 2 seconds.  Neurological:     Mental Status: She is alert and oriented to person, place, and time.     GCS: GCS eye subscore is 4. GCS verbal subscore is 5. GCS motor subscore is 6.     Cranial Nerves: Cranial nerves 2-12 are intact. No facial asymmetry.      Sensory: Sensation is intact. No sensory deficit.     Motor: Motor function is intact. No weakness, tremor or pronator drift.     Coordination: Coordination is intact. Finger-Nose-Finger Test normal.     Gait: Gait is intact.     Comments: Slow gait  Strength 5/5 bilateral upper and lower extremities symmetric  No drift  Psychiatric:        Mood and Affect: Mood normal.        Behavior: Behavior normal. Behavior is cooperative.     ED Results and Treatments Labs (all labs ordered are listed, but only abnormal results are displayed) Labs Reviewed  COMPREHENSIVE METABOLIC PANEL - Abnormal; Notable for the following components:      Result Value   Sodium 130 (*)    Chloride 92 (*)    Glucose, Bld 324 (*)    BUN 35 (*)    Creatinine, Ser 1.71 (*)    Calcium 8.6 (*)    Albumin 3.1 (*)    ALT 50 (*)    GFR, Estimated 29 (*)    All other components within normal limits  CBC WITH DIFFERENTIAL/PLATELET - Abnormal; Notable for the following components:   RBC 3.75 (*)    Hemoglobin 10.5 (*)    HCT 32.4 (*)    All other components within normal limits  URINALYSIS, ROUTINE W REFLEX MICROSCOPIC - Abnormal; Notable for the following components:   APPearance CLOUDY (*)    Glucose, UA 250 (*)    Hgb urine dipstick MODERATE (*)    Bilirubin Urine SMALL (*)    Leukocytes,Ua LARGE (*)    All other components within normal limits  URINALYSIS, MICROSCOPIC (REFLEX) - Abnormal; Notable for the following components:   Bacteria, UA RARE (*)    All other components within normal limits  CBG MONITORING, ED - Abnormal; Notable for the following components:   Glucose-Capillary 52 (*)    All other components within normal limits  URINE CULTURE  TSH  T4, FREE  CBG MONITORING, ED  TROPONIN I (HIGH SENSITIVITY)                                                                                                                          Radiology CT ABDOMEN PELVIS WO CONTRAST  Result Date:  07/13/2023 CLINICAL DATA:  Gross macroscopic hematuria. History of prior bladder tumor resection. Weakness  with painless hematuria. EXAM: CT ABDOMEN AND PELVIS WITHOUT CONTRAST TECHNIQUE: Multidetector CT imaging of the abdomen and pelvis was performed following the standard protocol without IV contrast. RADIATION DOSE REDUCTION: This exam was performed according to the departmental dose-optimization program which includes automated exposure control, adjustment of the mA and/or kV according to patient size and/or use of iterative reconstruction technique. COMPARISON:  01/10/2023 FINDINGS: Lower chest: Mild dependent atelectasis in the lung bases. Hepatobiliary: No focal liver abnormality is seen. Status post cholecystectomy. No biliary dilatation. Pancreas: Unremarkable. No pancreatic ductal dilatation or surrounding inflammatory changes. Spleen: Normal in size without focal abnormality. Adrenals/Urinary Tract: No adrenal gland nodules. Kidneys are symmetrical. No hydronephrosis or hydroureter. No renal or ureteral stones. Asymmetrical bladder wall thickening to the left with stranding anteriorly. This is similar to prior study and may represent residual or recurrent neoplasm or postoperative changes. Inflammatory process less likely. Stomach/Bowel: Stomach, small bowel, and colon are not abnormally distended. No wall thickening or inflammatory changes are appreciated. Appendix is not identified. Vascular/Lymphatic: Aortic atherosclerosis. No enlarged abdominal or pelvic lymph nodes. Reproductive: Status post hysterectomy. No adnexal masses. Other: No free air or free fluid in the abdomen. Postoperative sutures in the low anterior pelvic wall. Musculoskeletal: Degenerative changes in the spine and hips. No acute bony abnormalities. IMPRESSION: 1. Persistent finding of asymmetric wall thickening in the left side of the bladder with stranding adjacent. This is likely postoperative but could indicate residual or  recurrent neoplasm. 2. No renal or ureteral stone or obstruction. Electronically Signed   By: Burman Nieves M.D.   On: 07/13/2023 01:03   CT Head Wo Contrast  Result Date: 07/13/2023 CLINICAL DATA:  Bilateral leg weakness EXAM: CT HEAD WITHOUT CONTRAST TECHNIQUE: Contiguous axial images were obtained from the base of the skull through the vertex without intravenous contrast. RADIATION DOSE REDUCTION: This exam was performed according to the departmental dose-optimization program which includes automated exposure control, adjustment of the mA and/or kV according to patient size and/or use of iterative reconstruction technique. COMPARISON:  04/30/2022 FINDINGS: Brain: No evidence of acute infarction, hemorrhage, hydrocephalus, extra-axial collection or mass lesion/mass effect. Mild atrophic changes are noted. Small right-sided subinsular lacunar infarct is noted and stable. Vascular: No hyperdense vessel or unexpected calcification. Skull: Normal. Negative for fracture or focal lesion. Sinuses/Orbits: No acute finding. Other: None. IMPRESSION: No acute intracranial abnormality noted. Electronically Signed   By: Alcide Clever M.D.   On: 07/13/2023 00:28    Pertinent labs & imaging results that were available during my care of the patient were reviewed by me and considered in my medical decision making (see MDM for details).  Medications Ordered in ED Medications  sodium chloride 0.9 % bolus 1,000 mL (1,000 mLs Intravenous New Bag/Given 07/13/23 0102)  cefTRIAXone (ROCEPHIN) 1 g in sodium chloride 0.9 % 100 mL IVPB (1 g Intravenous New Bag/Given 07/13/23 0104)  Procedures Procedures Emergency Ultrasound Study:   Angiocath insertion Performed by: Sloan Leiter  Consent: Verbal consent obtained. Risks and benefits: risks, benefits and alternatives were  discussed Immediately prior to procedure the correct patient, procedure, equipment, support staff and site/side marked as needed.  Indication: difficult IV access Preparation: Patient was prepped and draped in the usual sterile fashion. Vein Location: The R medial cubital vein was visualized during assessment for potential access sites and was found to be patent/ easily compressed with linear ultrasound.  The needle was visualized with real-time ultrasound and guided into the vein. Gauge: 20g  Images were not saved due to the time sensitive nature of the situation. Normal blood return.  Patient tolerance: Patient tolerated the procedure well with no immediate complications.  (including critical care time)  Medical Decision Making / ED Course    Medical Decision Making:    [name] is a 88 y.o. female with past medical history as below, significant for HLD, HTN, hypothyroid, DM1 who presents to the ED with complaint of weakness.. The complaint involves an extensive differential diagnosis and also carries with it a high risk of complications and morbidity.  Serious etiology was considered. Ddx includes but is not limited to: Metabolic derangement, infectious, endocrine disturbance, thyroid disturbance, CVA, neuromuscular disorder, etc.  Complete initial physical exam performed, notably the patient  was no acute distress, asymptomatic.    Reviewed and confirmed nursing documentation for past medical history, family history, social history.  Vital signs reviewed.    Clinical Course as of 07/13/23 0223  Wed Jul 13, 2023  0028 UA c/w UTI  She had recent bladder biopsy, likely etiology of the RBC in her urine  [SG]  0029 Creatinine(!): 1.71 Somewhat worsened from prior; similar value 3 mos ago [SG]  0216 Feeling better [SG]  0218 Sodium(!): 130 Give IVF [SG]    Clinical Course User Index [SG] Sloan Leiter, DO     Patient with lower extremity weakness for greater than 2 months,  gradually worsening.  Will check screening labs, get head CT, IV fluids. Prior bladder tumor resection in June, now w/ hematuria, apparent UTI, weakness; will check CTAP  Echo 11/22 reviewed, EF 65 to 70%, indeterminate diastolic parameters, no clear diastolic dysfunction  She was seen in 8/24 for similar complaints and found of UTI, discharged home in stable condition. Also seen 6/24 similar   Workup shows mild UTI, creatinine similar to prior, mildly worsened from previous value but she has multiple values in the past that are similar if not worse.  IV fluids, tolerant p.o. without difficulty.  Neuroexam is nonfocal.  CT head was stable, CT abdomen with asymmetric thickening likely secondary to recent surgery but unable to rule out recurrence of neoplasm.  She has follow-up with urology in around 1 week.  Started on oral antibiotics, encourage rehydration, encourage follow-up with urology with PCP.  Prior urine culture E. coli sensitive to Ancef, will give Keflex, was given Rocephin here.  Low sufficient for pyelonephritis, she is not septic.  Neuro non-focal  The patient improved significantly and was discharged in stable condition. Detailed discussions were had with the patient regarding current findings, and need for close f/u with PCP or on call doctor. The patient has been instructed to return immediately if the symptoms worsen in any way for re-evaluation. Patient verbalized understanding and is in agreement with current care plan. All questions answered prior to discharge.  Additional history obtained: -Additional history obtained from family -External records from outside source obtained and reviewed including: Chart review including previous notes, labs, imaging, consultation notes including  Prior ER visits, prior echo, prior urology procedure   Lab Tests: -I ordered, reviewed, and interpreted labs.   The pertinent results include:   Labs Reviewed   COMPREHENSIVE METABOLIC PANEL - Abnormal; Notable for the following components:      Result Value   Sodium 130 (*)    Chloride 92 (*)    Glucose, Bld 324 (*)    BUN 35 (*)    Creatinine, Ser 1.71 (*)    Calcium 8.6 (*)    Albumin 3.1 (*)    ALT 50 (*)    GFR, Estimated 29 (*)    All other components within normal limits  CBC WITH DIFFERENTIAL/PLATELET - Abnormal; Notable for the following components:   RBC 3.75 (*)    Hemoglobin 10.5 (*)    HCT 32.4 (*)    All other components within normal limits  URINALYSIS, ROUTINE W REFLEX MICROSCOPIC - Abnormal; Notable for the following components:   APPearance CLOUDY (*)    Glucose, UA 250 (*)    Hgb urine dipstick MODERATE (*)    Bilirubin Urine SMALL (*)    Leukocytes,Ua LARGE (*)    All other components within normal limits  URINALYSIS, MICROSCOPIC (REFLEX) - Abnormal; Notable for the following components:   Bacteria, UA RARE (*)    All other components within normal limits  CBG MONITORING, ED - Abnormal; Notable for the following components:   Glucose-Capillary 52 (*)    All other components within normal limits  URINE CULTURE  TSH  T4, FREE  CBG MONITORING, ED  TROPONIN I (HIGH SENSITIVITY)    Notable for uti  EKG   EKG Interpretation Date/Time:    Ventricular Rate:    PR Interval:    QRS Duration:    QT Interval:    QTC Calculation:   R Axis:      Text Interpretation:           Imaging Studies ordered: I ordered imaging studies including CTAP CTH I independently visualized the following imaging with scope of interpretation limited to determining acute life threatening conditions related to emergency care; findings noted above, significant for as above I independently visualized and interpreted imaging. I agree with the radiologist interpretation   Medicines ordered and prescription drug management: Meds ordered this encounter  Medications   sodium chloride 0.9 % bolus 1,000 mL   cefTRIAXone (ROCEPHIN) 1  g in sodium chloride 0.9 % 100 mL IVPB    Order Specific Question:   Antibiotic Indication:    Answer:   UTI    -I have reviewed the patients home medicines and have made adjustments as needed   Consultations Obtained: na   Cardiac Monitoring: The patient was maintained on a cardiac monitor.  I personally viewed and interpreted the cardiac monitored which showed an underlying rhythm of: NSR  Social Determinants of Health:  Diagnosis or treatment significantly limited by social determinants of health: former smoker   Reevaluation: After the interventions noted above, I reevaluated the patient and found that they have improved  Co morbidities that complicate the patient evaluation  Past Medical History:  Diagnosis Date   Anemia    Arthritis    Colon polyp    Diabetes mellitus    type 1   Heart murmur    Hyperlipidemia    Hypertension  Hypothyroidism    Pneumonia    Poor appetite    Thyroid disease       Dispostion: Disposition decision including need for hospitalization was considered, and patient discharged from emergency department.    Final Clinical Impression(s) / ED Diagnoses Final diagnoses:  Generalized weakness  Acute cystitis with hematuria        Sloan Leiter, DO 07/13/23 0223    Sloan Leiter, DO 07/13/23 0225

## 2023-07-12 NOTE — ED Notes (Signed)
Attempted to get blood work.  Pt will need [place]

## 2023-07-12 NOTE — Patient Outreach (Signed)
Care Coordination   07/12/2023 Name: [name] MRN: [id number] DOB: 06-10-1935   Care Coordination Outreach Attempts:  An unsuccessful telephone outreach was attempted for a scheduled appointment today.  Follow Up Plan:  Additional outreach attempts will be made to offer the patient care coordination information and services.   Encounter Outcome:  No Answer   Care Coordination Interventions:  No, not indicated    Bary Leriche, RN, MSN [facility]  [facility], [facility] Management Community Coordinator Direct Dial: [phone]  Fax: [phone] Website: [email or web address]

## 2023-07-12 NOTE — ED Notes (Signed)
Pt's BS 52, orange juice and lance crackers given

## 2023-07-12 NOTE — ED Triage Notes (Signed)
Pt states she is "weak" Last night dtg said they had to come and get her out of car to get inside home.  Takes a few steps and has to sit down.  This is progressively getting worse.   Recently had her thyroid meds changed.

## 2023-07-13 ENCOUNTER — Emergency Department (HOSPITAL_BASED_OUTPATIENT_CLINIC_OR_DEPARTMENT_OTHER): Payer: Medicare PPO

## 2023-07-13 ENCOUNTER — Ambulatory Visit: Payer: Medicare PPO | Admitting: Nurse Practitioner

## 2023-07-13 ENCOUNTER — Telehealth: Payer: Self-pay

## 2023-07-13 DIAGNOSIS — R29898 Other symptoms and signs involving the musculoskeletal system: Secondary | ICD-10-CM | POA: Diagnosis not present

## 2023-07-13 DIAGNOSIS — I6381 Other cerebral infarction due to occlusion or stenosis of small artery: Secondary | ICD-10-CM | POA: Diagnosis not present

## 2023-07-13 DIAGNOSIS — Z9049 Acquired absence of other specified parts of digestive tract: Secondary | ICD-10-CM | POA: Diagnosis not present

## 2023-07-13 DIAGNOSIS — N3289 Other specified disorders of bladder: Secondary | ICD-10-CM | POA: Diagnosis not present

## 2023-07-13 LAB — URINALYSIS, ROUTINE W REFLEX MICROSCOPIC
Glucose, UA: 250 mg/dL — AB
Ketones, ur: NEGATIVE mg/dL
Nitrite: NEGATIVE
Protein, ur: NEGATIVE mg/dL
Specific Gravity, Urine: 1.015 (ref 1.005–1.030)
pH: 6 (ref 5.0–8.0)

## 2023-07-13 LAB — URINALYSIS, MICROSCOPIC (REFLEX)

## 2023-07-13 LAB — T4, FREE: Free T4: 1.78 ng/dL — ABNORMAL HIGH (ref 0.61–1.12)

## 2023-07-13 LAB — TSH: TSH: 1.939 u[IU]/mL (ref 0.350–4.500)

## 2023-07-13 LAB — TROPONIN I (HIGH SENSITIVITY): Troponin I (High Sensitivity): 5 ng/L (ref <18)

## 2023-07-13 MED ORDER — SODIUM CHLORIDE 0.9 % IV SOLN
1.0000 g | Freq: Once | INTRAVENOUS | Status: AC
  Administered 2023-07-13: 1 g via INTRAVENOUS
  Filled 2023-07-13 (×2): qty 10

## 2023-07-13 MED ORDER — CEPHALEXIN 500 MG PO CAPS: 500.0000 mg | ORAL_CAPSULE | Freq: Three times a day (TID) | ORAL | 0 refills | Status: AC

## 2023-07-13 NOTE — ED Notes (Signed)
Patient transported to CT 
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
## 2023-07-13 NOTE — Discharge Instructions (Signed)
It was a pleasure caring for you today in the emergency department.  Please see your urologist for follow-up and your PCP.  Please return to the emergency department for any worsening or worrisome symptoms.
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
## 2023-07-14 LAB — URINE CULTURE

## 2023-07-19 ENCOUNTER — Other Ambulatory Visit: Payer: Self-pay | Admitting: Internal Medicine

## 2023-07-20 DIAGNOSIS — D090 Carcinoma in situ of bladder: Secondary | ICD-10-CM | POA: Diagnosis not present

## 2023-07-20 DIAGNOSIS — C678 Malignant neoplasm of overlapping sites of bladder: Secondary | ICD-10-CM | POA: Diagnosis not present

## 2023-07-21 ENCOUNTER — Other Ambulatory Visit: Payer: Self-pay | Admitting: Urology

## 2023-07-25 ENCOUNTER — Ambulatory Visit: Payer: Self-pay
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
## 2023-07-31 ENCOUNTER — Other Ambulatory Visit: Payer: Self-pay

## 2023-07-31 ENCOUNTER — Encounter (HOSPITAL_COMMUNITY): Payer: Self-pay

## 2023-07-31 ENCOUNTER — Inpatient Hospital Stay (HOSPITAL_COMMUNITY)
Admission: EM | Admit: 2023-07-31 | Discharge: 2023-08-04 | DRG: 638 | Disposition: A | Discharge status: Home Health | Payer: Medicare PPO | Attending: Internal Medicine | Admitting: Internal Medicine

## 2023-07-31 ENCOUNTER — Emergency Department (HOSPITAL_COMMUNITY): Payer: Medicare PPO

## 2023-07-31 DIAGNOSIS — Z79899 Other long term (current) drug therapy: Secondary | ICD-10-CM | POA: Diagnosis not present

## 2023-07-31 DIAGNOSIS — L8990 Pressure ulcer of unspecified site, unspecified stage: Secondary | ICD-10-CM | POA: Diagnosis present

## 2023-07-31 DIAGNOSIS — E860 Dehydration: Secondary | ICD-10-CM | POA: Diagnosis not present

## 2023-07-31 DIAGNOSIS — Z8249 Family history of ischemic heart disease and other diseases of the circulatory system: Secondary | ICD-10-CM

## 2023-07-31 DIAGNOSIS — E1022 Type 1 diabetes mellitus with diabetic chronic kidney disease: Secondary | ICD-10-CM | POA: Diagnosis present

## 2023-07-31 DIAGNOSIS — I129 Hypertensive chronic kidney disease with stage 1 through stage 4 chronic kidney disease, or unspecified chronic kidney disease: Secondary | ICD-10-CM | POA: Diagnosis present

## 2023-07-31 DIAGNOSIS — M7989 Other specified soft tissue disorders: Secondary | ICD-10-CM | POA: Diagnosis not present

## 2023-07-31 DIAGNOSIS — E139 Other specified diabetes mellitus without complications: Secondary | ICD-10-CM | POA: Diagnosis present

## 2023-07-31 DIAGNOSIS — I480 Paroxysmal atrial fibrillation: Secondary | ICD-10-CM | POA: Diagnosis not present

## 2023-07-31 DIAGNOSIS — E876 Hypokalemia: Secondary | ICD-10-CM | POA: Diagnosis present

## 2023-07-31 DIAGNOSIS — E785 Hyperlipidemia, unspecified: Secondary | ICD-10-CM | POA: Diagnosis present

## 2023-07-31 DIAGNOSIS — Z79890 Hormone replacement therapy (postmenopausal): Secondary | ICD-10-CM | POA: Diagnosis not present

## 2023-07-31 DIAGNOSIS — R9431 Abnormal electrocardiogram [ECG] [EKG]: Secondary | ICD-10-CM | POA: Diagnosis not present

## 2023-07-31 DIAGNOSIS — Z7901 Long term (current) use of anticoagulants: Secondary | ICD-10-CM | POA: Diagnosis not present

## 2023-07-31 DIAGNOSIS — R1110 Vomiting, unspecified: Secondary | ICD-10-CM | POA: Diagnosis not present

## 2023-07-31 DIAGNOSIS — Z87440 Personal history of urinary (tract) infections: Secondary | ICD-10-CM | POA: Diagnosis not present

## 2023-07-31 DIAGNOSIS — E440 Moderate protein-calorie malnutrition: Secondary | ICD-10-CM | POA: Diagnosis not present

## 2023-07-31 DIAGNOSIS — E1010 Type 1 diabetes mellitus with ketoacidosis without coma: Secondary | ICD-10-CM | POA: Diagnosis not present

## 2023-07-31 DIAGNOSIS — D649 Anemia, unspecified: Secondary | ICD-10-CM | POA: Diagnosis present

## 2023-07-31 DIAGNOSIS — L89152 Pressure ulcer of sacral region, stage 2: Secondary | ICD-10-CM | POA: Diagnosis not present

## 2023-07-31 DIAGNOSIS — Z823 Family history of stroke: Secondary | ICD-10-CM

## 2023-07-31 DIAGNOSIS — Z87891 Personal history of nicotine dependence: Secondary | ICD-10-CM | POA: Diagnosis not present

## 2023-07-31 DIAGNOSIS — N179 Acute kidney failure, unspecified: Secondary | ICD-10-CM | POA: Diagnosis not present

## 2023-07-31 DIAGNOSIS — M79601 Pain in right arm: Secondary | ICD-10-CM | POA: Diagnosis present

## 2023-07-31 DIAGNOSIS — R7989 Other specified abnormal findings of blood chemistry: Secondary | ICD-10-CM | POA: Diagnosis present

## 2023-07-31 DIAGNOSIS — R531 Weakness: Secondary | ICD-10-CM | POA: Diagnosis not present

## 2023-07-31 DIAGNOSIS — E8809 Other disorders of plasma-protein metabolism, not elsewhere classified: Secondary | ICD-10-CM | POA: Diagnosis not present

## 2023-07-31 DIAGNOSIS — R17 Unspecified jaundice: Secondary | ICD-10-CM | POA: Diagnosis present

## 2023-07-31 DIAGNOSIS — Z833 Family history of diabetes mellitus: Secondary | ICD-10-CM | POA: Diagnosis not present

## 2023-07-31 DIAGNOSIS — Z7985 Long-term (current) use of injectable non-insulin antidiabetic drugs: Secondary | ICD-10-CM

## 2023-07-31 DIAGNOSIS — N1832 Chronic kidney disease, stage 3b: Secondary | ICD-10-CM | POA: Diagnosis present

## 2023-07-31 DIAGNOSIS — R739 Hyperglycemia, unspecified: Secondary | ICD-10-CM | POA: Diagnosis not present

## 2023-07-31 DIAGNOSIS — Z6822 Body mass index (BMI) 22.0-22.9, adult: Secondary | ICD-10-CM

## 2023-07-31 DIAGNOSIS — E890 Postprocedural hypothyroidism: Secondary | ICD-10-CM | POA: Diagnosis not present

## 2023-07-31 DIAGNOSIS — R0989 Other specified symptoms and signs involving the circulatory and respiratory systems: Secondary | ICD-10-CM | POA: Diagnosis not present

## 2023-07-31 DIAGNOSIS — I959 Hypotension, unspecified: Secondary | ICD-10-CM | POA: Diagnosis not present

## 2023-07-31 DIAGNOSIS — Z90710 Acquired absence of both cervix and uterus: Secondary | ICD-10-CM | POA: Diagnosis not present

## 2023-07-31 DIAGNOSIS — R319 Hematuria, unspecified: Secondary | ICD-10-CM | POA: Diagnosis not present

## 2023-07-31 DIAGNOSIS — E1110 Type 2 diabetes mellitus with ketoacidosis without coma: Secondary | ICD-10-CM | POA: Diagnosis present

## 2023-07-31 DIAGNOSIS — Z83438 Family history of other disorder of lipoprotein metabolism and other lipidemia: Secondary | ICD-10-CM

## 2023-07-31 DIAGNOSIS — R5381 Other malaise: Secondary | ICD-10-CM | POA: Diagnosis not present

## 2023-07-31 LAB — BASIC METABOLIC PANEL
Anion gap: >20 — ABNORMAL HIGH (ref 5–15)
Anion gap: 18 — ABNORMAL HIGH (ref 5–15)
Anion gap: 12 (ref 5–15)
BUN: 28 mg/dL — ABNORMAL HIGH (ref 8–23)
BUN: 27 mg/dL — ABNORMAL HIGH (ref 8–23)
BUN: 24 mg/dL — ABNORMAL HIGH (ref 8–23)
CO2: 7 mmol/L — ABNORMAL LOW (ref 22–32)
CO2: 14 mmol/L — ABNORMAL LOW (ref 22–32)
CO2: 19 mmol/L — ABNORMAL LOW (ref 22–32)
Calcium: 8.6 mg/dL — ABNORMAL LOW (ref 8.9–10.3)
Calcium: 9 mg/dL (ref 8.9–10.3)
Calcium: 8.5 mg/dL — ABNORMAL LOW (ref 8.9–10.3)
Chloride: 100 mmol/L (ref 98–111)
Chloride: 102 mmol/L (ref 98–111)
Chloride: 103 mmol/L (ref 98–111)
Creatinine, Ser: 1.67 mg/dL — ABNORMAL HIGH (ref 0.44–1.00)
Creatinine, Ser: 1.51 mg/dL — ABNORMAL HIGH (ref 0.44–1.00)
Creatinine, Ser: 1.25 mg/dL — ABNORMAL HIGH (ref 0.44–1.00)
GFR, Estimated: 30 mL/min — ABNORMAL LOW (ref >60)
GFR, Estimated: 33 mL/min — ABNORMAL LOW (ref >60)
GFR, Estimated: 42 mL/min — ABNORMAL LOW (ref >60)
Glucose, Bld: 550 mg/dL (ref 70–99)
Glucose, Bld: 274 mg/dL — ABNORMAL HIGH (ref 70–99)
Glucose, Bld: 193 mg/dL — ABNORMAL HIGH (ref 70–99)
Potassium: 5 mmol/L (ref 3.5–5.1)
Potassium: 4.4 mmol/L (ref 3.5–5.1)
Potassium: 4.4 mmol/L (ref 3.5–5.1)
Sodium: 131 mmol/L — ABNORMAL LOW (ref 135–145)
Sodium: 134 mmol/L — ABNORMAL LOW (ref 135–145)
Sodium: 134 mmol/L — ABNORMAL LOW (ref 135–145)

## 2023-07-31 LAB — GLUCOSE, CAPILLARY
Glucose-Capillary: 576 mg/dL (ref 70–99)
Glucose-Capillary: 452 mg/dL — ABNORMAL HIGH (ref 70–99)
Glucose-Capillary: 535 mg/dL (ref 70–99)
Glucose-Capillary: 447 mg/dL — ABNORMAL HIGH (ref 70–99)
Glucose-Capillary: 396 mg/dL — ABNORMAL HIGH (ref 70–99)
Glucose-Capillary: 240 mg/dL — ABNORMAL HIGH (ref 70–99)
Glucose-Capillary: 180 mg/dL — ABNORMAL HIGH (ref 70–99)
Glucose-Capillary: 176 mg/dL — ABNORMAL HIGH (ref 70–99)
Glucose-Capillary: 189 mg/dL — ABNORMAL HIGH (ref 70–99)
Glucose-Capillary: 155 mg/dL — ABNORMAL HIGH (ref 70–99)

## 2023-07-31 LAB — COMPREHENSIVE METABOLIC PANEL
ALT: 84 U/L — ABNORMAL HIGH (ref 0–44)
AST: 136 U/L — ABNORMAL HIGH (ref 15–41)
Albumin: 3.5 g/dL (ref 3.5–5.0)
Alkaline Phosphatase: 120 U/L (ref 38–126)
BUN: 28 mg/dL — ABNORMAL HIGH (ref 8–23)
CO2: <7 mmol/L — ABNORMAL LOW (ref 22–32)
Calcium: 9.3 mg/dL (ref 8.9–10.3)
Chloride: 94 mmol/L — ABNORMAL LOW (ref 98–111)
Creatinine, Ser: 1.78 mg/dL — ABNORMAL HIGH (ref 0.44–1.00)
GFR, Estimated: 27 mL/min — ABNORMAL LOW (ref >60)
Glucose, Bld: 699 mg/dL (ref 70–99)
Potassium: 6.1 mmol/L — ABNORMAL HIGH (ref 3.5–5.1)
Sodium: 128 mmol/L — ABNORMAL LOW (ref 135–145)
Total Bilirubin: 1.9 mg/dL — ABNORMAL HIGH (ref 0.3–1.2)
Total Protein: 7.4 g/dL (ref 6.5–8.1)

## 2023-07-31 LAB — I-STAT CHEM 8, ED
BUN: 28 mg/dL — ABNORMAL HIGH (ref 8–23)
Calcium, Ion: 1.14 mmol/L — ABNORMAL LOW (ref 1.15–1.40)
Chloride: 101 mmol/L (ref 98–111)
Creatinine, Ser: 1.3 mg/dL — ABNORMAL HIGH (ref 0.44–1.00)
Glucose, Bld: >700 mg/dL (ref 70–99)
HCT: 39 % (ref 36.0–46.0)
Hemoglobin: 13.3 g/dL (ref 12.0–15.0)
Potassium: 5.9 mmol/L — ABNORMAL HIGH (ref 3.5–5.1)
Sodium: 127 mmol/L — ABNORMAL LOW (ref 135–145)
TCO2: 8 mmol/L — ABNORMAL LOW (ref 22–32)

## 2023-07-31 LAB — CBC
HCT: 36.6 % (ref 36.0–46.0)
Hemoglobin: 11 g/dL — ABNORMAL LOW (ref 12.0–15.0)
MCH: 29.6 pg (ref 26.0–34.0)
MCHC: 30.1 g/dL (ref 30.0–36.0)
MCV: 98.7 fL (ref 80.0–100.0)
Platelets: 247 10*3/uL (ref 150–400)
RBC: 3.71 MIL/uL — ABNORMAL LOW (ref 3.87–5.11)
RDW: 15.2 % (ref 11.5–15.5)
WBC: 11.3 10*3/uL — ABNORMAL HIGH (ref 4.0–10.5)
nRBC: 0 % (ref 0.0–0.2)

## 2023-07-31 LAB — CBG MONITORING, ED
Glucose-Capillary: 596 mg/dL (ref 70–99)
Glucose-Capillary: >600 mg/dL (ref 70–99)
Glucose-Capillary: >600 mg/dL (ref 70–99)
Glucose-Capillary: >600 mg/dL (ref 70–99)
Glucose-Capillary: >600 mg/dL (ref 70–99)

## 2023-07-31 LAB — BLOOD GAS, VENOUS
Acid-base deficit: 22.8 mmol/L — ABNORMAL HIGH (ref 0.0–2.0)
Bicarbonate: 5.3 mmol/L — ABNORMAL LOW (ref 20.0–28.0)
O2 Saturation: 68.8 %
Patient temperature: 37
pCO2, Ven: 18 mm[Hg] — CL (ref 44–60)
pH, Ven: 7.08 — CL (ref 7.25–7.43)
pO2, Ven: 45 mm[Hg] (ref 32–45)

## 2023-07-31 LAB — LIPASE, BLOOD: Lipase: 18 U/L (ref 11–51)

## 2023-07-31 LAB — TROPONIN I (HIGH SENSITIVITY)
Troponin I (High Sensitivity): 6 ng/L (ref <18)
Troponin I (High Sensitivity): 10 ng/L (ref <18)

## 2023-07-31 LAB — MRSA NEXT GEN BY PCR, NASAL: MRSA by PCR Next Gen: NOT DETECTED

## 2023-07-31 LAB — BETA-HYDROXYBUTYRIC ACID
Beta-Hydroxybutyric Acid: >8 mmol/L — ABNORMAL HIGH (ref 0.05–0.27)
Beta-Hydroxybutyric Acid: 4.31 mmol/L — ABNORMAL HIGH (ref 0.05–0.27)

## 2023-07-31 MED ORDER — TRIMETHOPRIM 100 MG PO TABS
100.0000 mg | ORAL_TABLET | Freq: Every day | ORAL | Status: DC
  Administered 2023-08-01: 100 mg via ORAL
  Filled 2023-07-31 (×2): qty 1

## 2023-07-31 MED ORDER — LACTATED RINGERS IV BOLUS: 20.0000 mL/kg | Freq: Once | INTRAVENOUS | Status: DC

## 2023-07-31 MED ORDER — LACTATED RINGERS IV BOLUS
1000.0000 mL | Freq: Once | INTRAVENOUS | Status: AC
  Administered 2023-07-31: 1000 mL via INTRAVENOUS

## 2023-07-31 MED ORDER — ORAL CARE MOUTH RINSE: 15.0000 mL | OROMUCOSAL | Status: DC | PRN

## 2023-07-31 MED ORDER — SODIUM CHLORIDE 0.9% FLUSH
10.0000 mL | Freq: Two times a day (BID) | INTRAVENOUS | Status: DC
  Administered 2023-07-31 – 2023-08-04 (×8): 10 mL via INTRAVENOUS

## 2023-07-31 MED ORDER — SODIUM CHLORIDE 0.9% FLUSH: 10.0000 mL | Freq: Two times a day (BID) | INTRAVENOUS | Status: DC

## 2023-07-31 MED ORDER — LEVOTHYROXINE SODIUM 112 MCG PO TABS
112.0000 ug | ORAL_TABLET | Freq: Every day | ORAL | Status: DC
  Administered 2023-08-01 – 2023-08-04 (×4): 112 ug via ORAL
  Filled 2023-07-31 (×4): qty 1

## 2023-07-31 MED ORDER — AMIODARONE HCL 200 MG PO TABS
100.0000 mg | ORAL_TABLET | Freq: Every day | ORAL | Status: DC
  Administered 2023-07-31 – 2023-08-04 (×5): 100 mg via ORAL
  Filled 2023-07-31 (×5): qty 1

## 2023-07-31 MED ORDER — LACTATED RINGERS IV SOLN: INTRAVENOUS | Status: AC

## 2023-07-31 MED ORDER — PANTOPRAZOLE SODIUM 40 MG PO TBEC
40.0000 mg | DELAYED_RELEASE_TABLET | Freq: Every day | ORAL | Status: DC
  Administered 2023-08-01 – 2023-08-04 (×4): 40 mg via ORAL
  Filled 2023-07-31 (×4): qty 1

## 2023-07-31 MED ORDER — DEXTROSE 50 % IV SOLN: 0.0000 mL | INTRAVENOUS | Status: DC | PRN

## 2023-07-31 MED ORDER — INSULIN REGULAR(HUMAN) IN NACL 100-0.9 UT/100ML-% IV SOLN
INTRAVENOUS | Status: DC
  Administered 2023-07-31: 5.5 [IU]/h via INTRAVENOUS
  Administered 2023-08-01: 1.2 [IU]/h via INTRAVENOUS
  Filled 2023-07-31 (×2): qty 100

## 2023-07-31 MED ORDER — DILTIAZEM HCL ER COATED BEADS 180 MG PO CP24
180.0000 mg | ORAL_CAPSULE | Freq: Every day | ORAL | Status: DC
  Administered 2023-07-31 – 2023-08-04 (×4): 180 mg via ORAL
  Filled 2023-07-31 (×5): qty 1

## 2023-07-31 MED ORDER — DILTIAZEM HCL ER BEADS 180 MG PO CP24: 180.0000 mg | ORAL_CAPSULE | Freq: Every day | ORAL | Status: DC

## 2023-07-31 MED ORDER — ONDANSETRON HCL 4 MG/2ML IJ SOLN
4.0000 mg | Freq: Four times a day (QID) | INTRAMUSCULAR | Status: DC | PRN
  Administered 2023-07-31: 4 mg via INTRAVENOUS
  Filled 2023-07-31: qty 2

## 2023-07-31 MED ORDER — CHLORHEXIDINE GLUCONATE CLOTH 2 % EX PADS
6.0000 | MEDICATED_PAD | Freq: Every day | CUTANEOUS | Status: DC
  Administered 2023-07-31 – 2023-08-04 (×5): 6 via TOPICAL

## 2023-07-31 MED ORDER — DEXTROSE IN LACTATED RINGERS 5 % IV SOLN
INTRAVENOUS | Status: AC
  Administered 2023-08-01: 100 mL/h via INTRAVENOUS

## 2023-07-31 MED ORDER — ACETAMINOPHEN 325 MG PO TABS: 650.0000 mg | ORAL_TABLET | Freq: Four times a day (QID) | ORAL | Status: DC | PRN

## 2023-07-31 MED ORDER — ONDANSETRON HCL 4 MG PO TABS: 4.0000 mg | ORAL_TABLET | Freq: Four times a day (QID) | ORAL | Status: DC | PRN

## 2023-07-31 MED ORDER — ACETAMINOPHEN 650 MG RE SUPP: 650.0000 mg | Freq: Four times a day (QID) | RECTAL | Status: DC | PRN

## 2023-07-31 MED ORDER — APIXABAN 2.5 MG PO TABS
2.5000 mg | ORAL_TABLET | Freq: Two times a day (BID) | ORAL | Status: DC
  Administered 2023-07-31 – 2023-08-03 (×6): 2.5 mg via ORAL
  Filled 2023-07-31 (×6): qty 1

## 2023-07-31 MED ORDER — MIRTAZAPINE 15 MG PO TABS
30.0000 mg | ORAL_TABLET | Freq: Every day | ORAL | Status: DC
  Administered 2023-08-01 – 2023-08-03 (×3): 30 mg via ORAL
  Filled 2023-07-31 (×3): qty 2

## 2023-07-31 NOTE — Progress Notes (Signed)
Patient has two PIV access at this time. Talked patient's RN regarding this matter. Informed patient's RN that put in the consult, if lose one of PIV access. HS [employer]

## 2023-07-31 NOTE — ED Notes (Signed)
ED TO INPATIENT HANDOFF REPORT  ED Nurse Name and Phone #:  Mellody Dance  -  [phone]  S Name/Age/Gender [name] 88 y.o. female Room/Bed: WA19/WA19  Code Status   Code Status: Prior  Home/SNF/Other Home Patient oriented to: self, place, time, and situation Is this baseline? Yes   Triage Complete: Triage complete  Chief Complaint DKA (diabetic ketoacidosis) (HCC) [E11.10]  Triage Note Patient brought in by EMS due to high blood sugar. Reports this has been going on for the past 2 days. Pt reports weakness and started having vomiting upon arrival to the ER.   Allergies No Known Allergies  Level of Care/Admitting Diagnosis ED Disposition     ED Disposition  Admit   Condition  --   Comment  Hospital Area: [facility] [[id number]]  Level of Care: Stepdown [14]  Admit to SDU based on following criteria: Severe physiological/psychological symptoms:  Any diagnosis requiring assessment & intervention at least every 4 hours on an ongoing basis to obtain desired patient outcomes including stability and rehabilitation  May admit patient to [facility] or [facility] if equivalent level of care is available:: No  Covid Evaluation: Asymptomatic - no recent exposure (last 10 days) testing not required  Diagnosis: DKA (diabetic ketoacidosis) [facility]) [166063]  Admitting Physician: Bobette Mo [[id number]]  Attending Physician: Bobette Mo [[id number]]  Certification:: I certify this patient will need inpatient services for at least 2 midnights  Expected Medical Readiness: 08/02/2023          B Medical/Surgery History Past Medical History:  Diagnosis Date   Anemia    Arthritis    Colon polyp    Diabetes mellitus    type 1   Heart murmur    Hyperlipidemia    Hypertension    Hypothyroidism    Pneumonia    Poor appetite    Thyroid disease    Past Surgical History:  Procedure Laterality Date   ABDOMINAL HYSTERECTOMY     APPENDECTOMY     BIOPSY   02/07/2022   Procedure: BIOPSY;  Surgeon: Meryl Dare, MD;  Location: [facility] ENDOSCOPY;  Service: Gastroenterology;;   BREAST EXCISIONAL BIOPSY Right    CARDIOVASCULAR STRESS TEST  07/15/2006   Normal scan, no ECG changes   COLONOSCOPY WITH PROPOFOL N/A 02/07/2022   Procedure: COLONOSCOPY WITH PROPOFOL;  Surgeon: Meryl Dare, MD;  Location: [facility] ENDOSCOPY;  Service: Gastroenterology;  Laterality: N/A;   CYSTOSCOPY WITH BIOPSY Bilateral 11/19/2022   Procedure: CYSTOSCOPY WITH TRANSURETHRAL RESECTION OF BLADDER TUMOR BILATERAL RETROGRADE PYELOGRAM;  Surgeon: Crista Elliot, MD;  Location: [facility] ORS;  Service: Urology;  Laterality: Bilateral;  1 HR FOR CASE   ESOPHAGOGASTRODUODENOSCOPY (EGD) WITH PROPOFOL N/A 02/07/2022   Procedure: ESOPHAGOGASTRODUODENOSCOPY (EGD) WITH PROPOFOL;  Surgeon: Meryl Dare, MD;  Location: [facility] ENDOSCOPY;  Service: Gastroenterology;  Laterality: N/A;   TRANSTHORACIC ECHOCARDIOGRAM  01/08/2011   EF 60-65%, moderae mitral regurg, LA mild-moderately dilated,   TRANSURETHRAL RESECTION OF BLADDER TUMOR N/A 04/04/2023   Procedure: TRANSURETHRAL RESECTION OF BLADDER TUMOR (TURBT);  Surgeon: Crista Elliot, MD;  Location: [facility] ORS;  Service: Urology;  Laterality: N/A;  1 HR FOR CASE     A IV Location/Drains/Wounds Patient Lines/Drains/Airways Status     Active Line/Drains/Airways     Name Placement date Placement time Site Days   Peripheral IV 07/31/23 20 G 1" Right;Posterior Hand 07/31/23  1108  Hand  less than 1  Intake/Output Last 24 hours No intake or output data in the 24 hours ending 07/31/23 1200  Labs/Imaging Results for orders placed or performed during the hospital encounter of 07/31/23 (from the past 48 hour(s))  CBG monitoring, ED     Status: Abnormal   Collection Time: 07/31/23  7:59 AM  Result Value Ref Range   Glucose-Capillary 596 (HH) 70 - 99 mg/dL    Comment: Glucose reference range applies only to samples taken after  fasting for at least 8 hours.  CBG monitoring, ED     Status: Abnormal   Collection Time: 07/31/23  8:25 AM  Result Value Ref Range   Glucose-Capillary >600 (HH) 70 - 99 mg/dL    Comment: Glucose reference range applies only to samples taken after fasting for at least 8 hours.   Comment 1 Notify RN   CBC     Status: Abnormal   Collection Time: 07/31/23  9:30 AM  Result Value Ref Range   WBC 11.3 (H) 4.0 - 10.5 K/uL   RBC 3.71 (L) 3.87 - 5.11 MIL/uL   Hemoglobin 11.0 (L) 12.0 - 15.0 g/dL   HCT 16.1 09.6 - 04.5 %   MCV 98.7 80.0 - 100.0 fL   MCH 29.6 26.0 - 34.0 pg   MCHC 30.1 30.0 - 36.0 g/dL   RDW 40.9 81.1 - 91.4 %   Platelets 247 150 - 400 K/uL   nRBC 0.0 0.0 - 0.2 %    Comment: Performed at [facility], [address], [place], [place] [address]  Lipase, blood     Status: None   Collection Time: 07/31/23  9:30 AM  Result Value Ref Range   Lipase 18 11 - 51 U/L    Comment: Performed at [facility], [address] [place], [place] [address]  Comprehensive metabolic panel     Status: Abnormal   Collection Time: 07/31/23  9:30 AM  Result Value Ref Range   Sodium 128 (L) 135 - 145 mmol/L   Potassium 6.1 (H) 3.5 - 5.1 mmol/L   Chloride 94 (L) 98 - 111 mmol/L   CO2 <7 (L) 22 - 32 mmol/L   Glucose, Bld 699 (HH) 70 - 99 mg/dL    Comment: CRITICAL RESULT CALLED TO, READ BACK BY AND VERIFIED WITH BRUNSON,T. RN AT 1013 07/31/23 MULLINS,T Glucose reference range applies only to samples taken after fasting for at least 8 hours.    BUN 28 (H) 8 - 23 mg/dL   Creatinine, Ser 8.65 (H) 0.44 - 1.00 mg/dL   Calcium 9.3 8.9 - 78.4 mg/dL   Total Protein 7.4 6.5 - 8.1 g/dL   Albumin 3.5 3.5 - 5.0 g/dL   AST 696 (H) 15 - 41 U/L   ALT 84 (H) 0 - 44 U/L   Alkaline Phosphatase 120 38 - 126 U/L   Total Bilirubin 1.9 (H) 0.3 - 1.2 mg/dL   GFR, Estimated 27 (L) >60 mL/min    Comment: (NOTE) Calculated using the CKD-EPI Creatinine Equation (2021)     Anion gap NOT CALCULATED 5 - 15    Comment: Performed at [facility], [address] [place], [place] [address]  Blood gas, venous (at [facility] and [facility])     Status: Abnormal   Collection Time: 07/31/23  9:30 AM  Result Value Ref Range   pH, Ven 7.08 (LL) 7.25 - 7.43    Comment: CRITICAL RESULT CALLED TO, READ BACK BY AND VERIFIED WITH: Adrian Prince RN  AT 0945 ON 07/31/2023 BY XIONG, K    pCO2, Ven 18 (LL) 44 - 60 mmHg    Comment: CRITICAL RESULT CALLED TO, READ BACK BY AND VERIFIED WITH: Adrian Prince RN AT 0945 ON 07/31/2023 BY XIONG, K    pO2, Ven 45 32 - 45 mmHg   Bicarbonate 5.3 (L) 20.0 - 28.0 mmol/L   Acid-base deficit 22.8 (H) 0.0 - 2.0 mmol/L   O2 Saturation 68.8 %   Patient temperature 37.0     Comment: Performed at [facility], [address] [place], [place] [address]  Beta-hydroxybutyric acid     Status: Abnormal   Collection Time: 07/31/23  9:30 AM  Result Value Ref Range   Beta-Hydroxybutyric Acid >8.00 (H) 0.05 - 0.27 mmol/L    Comment: RESULT CONFIRMED BY MANUAL DILUTION Performed at [facility], [address], [place], [place] [address]   CBG monitoring, ED     Status: Abnormal   Collection Time: 07/31/23 10:11 AM  Result Value Ref Range   Glucose-Capillary >600 (HH) 70 - 99 mg/dL    Comment: Glucose reference range applies only to samples taken after fasting for at least 8 hours.  Troponin I (High Sensitivity)     Status: None   Collection Time: 07/31/23 10:40 AM  Result Value Ref Range   Troponin I (High Sensitivity) 6 <18 ng/L    Comment: (NOTE) Elevated high sensitivity troponin I (hsTnI) values and significant  changes across serial measurements may suggest ACS but many other  chronic and acute conditions are known to elevate hsTnI results.  Refer to the "Links" section for chest pain algorithms and additional  guidance. Performed at [facility], [address] [place], [place]  [address]   I-stat chem 8, ed     Status: Abnormal   Collection Time: 07/31/23 10:47 AM  Result Value Ref Range   Sodium 127 (L) 135 - 145 mmol/L   Potassium 5.9 (H) 3.5 - 5.1 mmol/L   Chloride 101 98 - 111 mmol/L   BUN 28 (H) 8 - 23 mg/dL   Creatinine, Ser 2.13 (H) 0.44 - 1.00 mg/dL   Glucose, Bld >086 (HH) 70 - 99 mg/dL    Comment: Glucose reference range applies only to samples taken after fasting for at least 8 hours.   Calcium, Ion 1.14 (L) 1.15 - 1.40 mmol/L   TCO2 8 (L) 22 - 32 mmol/L   Hemoglobin 13.3 12.0 - 15.0 g/dL   HCT 57.8 46.9 - 62.9 %  CBG monitoring, ED     Status: Abnormal   Collection Time: 07/31/23 10:53 AM  Result Value Ref Range   Glucose-Capillary >600 (HH) 70 - 99 mg/dL    Comment: Glucose reference range applies only to samples taken after fasting for at least 8 hours.  CBG monitoring, ED     Status: Abnormal   Collection Time: 07/31/23 11:50 AM  Result Value Ref Range   Glucose-Capillary >600 (HH) 70 - 99 mg/dL    Comment: Glucose reference range applies only to samples taken after fasting for at least 8 hours.   DG Chest Port 1 View  Result Date: 07/31/2023 CLINICAL DATA:  Hyperglycemia.  Weakness.  Vomiting. EXAM: PORTABLE CHEST 1 VIEW COMPARISON:  05/25/2023. FINDINGS: Low lung volume. Mildly increased interstitial markings are likely accentuated by low lung volume. No frank pulmonary edema. Bilateral lung fields are clear. No acute consolidation or lung collapse. Bilateral costophrenic angles are clear. Normal cardio-mediastinal silhouette. No  acute osseous abnormalities. The soft tissues are within normal limits. IMPRESSION: 1. Low lung volume. No active disease. Electronically Signed   By: Jules Schick M.D.   On: 07/31/2023 10:34    Pending Labs Unresulted Labs (From admission, onward)     Start     Ordered   07/31/23 1730  Beta-hydroxybutyric acid  (Diabetes Ketoacidosis (DKA))  Once-Timed,   TIMED        07/31/23 1135   07/31/23 1330  Basic  metabolic panel  (Diabetes Ketoacidosis (DKA))  STAT Now then every 4 hours ,   R (with STAT occurrences)      07/31/23 1135   07/31/23 0810  Urinalysis, Routine w reflex microscopic -Urine, Clean Catch  Once,   URGENT       Question:  Specimen Source  Answer:  Urine, Clean Catch   07/31/23 0809            Vitals/Pain Today's Vitals   07/31/23 0804 07/31/23 0805 07/31/23 0806 07/31/23 0852  BP:   (!) 123/50 (!) 135/54  Pulse:   90 86  Resp:   18 (!) 22  Temp:   (!) 97.5 F (36.4 C) (!) 97.4 F (36.3 C)  TempSrc:   Oral Oral  SpO2:   99% 100%  Weight:  54.9 kg    Height:  5\' 2"  (1.575 m)    PainSc: 0-No pain       Isolation Precautions No active isolations  Medications Medications  lactated ringers infusion (has no administration in time range)  lactated ringers bolus 1,098 mL (has no administration in time range)  insulin regular, human (MYXREDLIN) 100 units/ 100 mL infusion (5.5 Units/hr Intravenous New Bag/Given 07/31/23 1111)  dextrose 50 % solution 0-50 mL (has no administration in time range)  sodium chloride flush (NS) 0.9 % injection 10 mL (has no administration in time range)  lactated ringers bolus 1,000 mL (1,000 mLs Intravenous New Bag/Given 07/31/23 1017)    Mobility walks     Focused Assessments    R Recommendations: See Admitting Provider Note  Report given to:   Additional Notes:

## 2023-07-31 NOTE — ED Provider Notes (Addendum)
[place] EMERGENCY DEPARTMENT AT [facility] Provider Note   CSN: [id number] Arrival date & time: 07/31/23  0750     History  Chief Complaint  Patient presents with   Hyperglycemia    [name] is a 88 y.o. female.  Patient started feeling well yesterday.  But no specific complaints.  Had some nausea little bit of vomiting no significant abdominal pain no upper respiratory symptoms.  No dysuria.  But patient was treated for urinary tract infection 2 weeks ago completed her antibiotics at that time.  Has had recent follow-up with urology and there was some abnormal bladder findings.  Which the got investigate further.  Patient started with some vomiting this morning and feeling not well blood sugars were running very high.  Patient is an adult onset type I diabetic.  Temp.  97.5 pulse 90 respirations 18 blood pressure 123/50 oxygen saturations 99%.  Patient is ill-appearing.  Is followed by endocrinology.  Patient's past medical history in for hypertension diabetes thyroid disease hyperlipidemia.  Will add on thyroid-stimulating hormone follow-up.  Patient in June 2024 had transurethral resection of bladder tumor.  Patient is a former smoker cigarettes.       Home Medications Prior to Admission medications   Medication Sig Start Date End Date Taking? Authorizing Provider  acetaminophen (TYLENOL) 500 MG tablet Take 500 mg by mouth every 6 (six) hours as needed for moderate pain (pain score 4-6).    [provider]  amiodarone (PACERONE) 100 MG tablet Take 1 tablet (100 mg total) by mouth daily. 06/15/23   Carlos Levering, NP  apixaban (ELIQUIS) 2.5 MG TABS tablet TAKE 1 TABLET BY MOUTH TWICE A DAY 02/06/23   Hilty, Lisette Abu, MD  Ascorbic Acid (VITAMIN C) 100 MG tablet Take 100 mg by mouth daily.    [provider]  Calcium Polycarbophil (FIBER-CAPS PO) Take 1 capsule by mouth daily as needed (constipation).    [provider]  Dulaglutide  (TRULICITY) 0.75 MG/0.5ML SOPN Inject 0.75 mg into the skin every Sunday.    [provider]  FeFum-FePoly-FA-B Cmp-C-Biot (FOLIVANE-PLUS) CAPS TAKE 1 CAPSULE BY MOUTH EVERY DAY IN THE MORNING 05/02/23   Heilingoetter, Cassandra L, PA-C  furosemide (LASIX) 20 MG tablet Take 1 tablet (20 mg total) by mouth daily as needed for fluid or edema. 03/24/23   Carlos Levering, NP  insulin aspart (NOVOLOG FLEXPEN) 100 UNIT/ML FlexPen Inject 8 Units into the skin 3 (three) times daily with meals. Patient taking differently: Inject 2-8 Units into the skin 3 (three) times daily as needed for high blood sugar. 09/04/21   Narda Bonds, MD  insulin glargine, 2 Unit Dial, (TOUJEO MAX SOLOSTAR) 300 UNIT/ML Solostar Pen Inject 14 Units into the skin at bedtime. Patient taking differently: Inject 11 Units into the skin at bedtime. 05/04/22   Almon Hercules, MD  KLOR-CON M20 20 MEQ tablet Take 20 mEq by mouth daily. 03/22/23   [provider]  levothyroxine (SYNTHROID) 112 MCG tablet Take 112 mcg by mouth daily before breakfast.    [provider]  mirtazapine (REMERON) 30 MG tablet Take 30 mg by mouth at bedtime.    [provider]  nitrofurantoin, macrocrystal-monohydrate, (MACROBID) 100 MG capsule Take 100 mg by mouth at bedtime. 07/13/23   [provider]  pantoprazole (PROTONIX) 40 MG tablet Take 1 tablet (40 mg total) by mouth daily at 6 (six) AM. 02/09/22   Osvaldo Shipper, MD  TIADYLT ER 180 MG 24 hr  capsule TAKE 1 CAPSULE BY MOUTH EVERY DAY 11/01/22   Hilty, Lisette Abu, MD  trimethoprim (TRIMPEX) 100 MG tablet Take 100 mg by mouth daily. 10/27/22   [provider]      Allergies    Patient has no known allergies.    Review of Systems   Review of Systems  Constitutional:  Negative for chills and fever.  HENT:  Negative for ear pain and sore throat.   Eyes:  Negative for pain and visual disturbance.  Respiratory:  Negative for cough and shortness of breath.    Cardiovascular:  Negative for chest pain and palpitations.  Gastrointestinal:  Positive for nausea and vomiting. Negative for abdominal pain.  Genitourinary:  Negative for dysuria and hematuria.  Musculoskeletal:  Negative for arthralgias and back pain.  Skin:  Negative for color change and rash.  Neurological:  Negative for seizures and syncope.  All other systems reviewed and are negative.   Physical Exam Updated Vital Signs BP (!) 135/54 Comment: nurse notified  Pulse 86   Temp (!) 97.4 F (36.3 C) (Oral)   Resp (!) 22   Ht 1.575 m (5\' 2" )   Wt 54.9 kg   SpO2 100%   BMI 22.14 kg/m  Physical Exam Vitals and nursing note reviewed.  Constitutional:      General: She is not in acute distress.    Appearance: Normal appearance. She is well-developed. She is ill-appearing.  HENT:     Head: Normocephalic and atraumatic.     Mouth/Throat:     Mouth: Mucous membranes are dry.  Eyes:     Extraocular Movements: Extraocular movements intact.     Conjunctiva/sclera: Conjunctivae normal.     Pupils: Pupils are equal, round, and reactive to light.  Cardiovascular:     Rate and Rhythm: Normal rate and regular rhythm.     Heart sounds: No murmur heard. Pulmonary:     Effort: Pulmonary effort is normal. No respiratory distress.     Breath sounds: Normal breath sounds.  Abdominal:     General: There is no distension.     Palpations: Abdomen is soft.     Tenderness: There is no abdominal tenderness. There is no guarding.  Musculoskeletal:        General: No swelling.     Cervical back: Normal range of motion and neck supple.  Skin:    General: Skin is warm and dry.     Capillary Refill: Capillary refill takes less than 2 seconds.  Neurological:     General: No focal deficit present.     Mental Status: She is alert and oriented to person, place, and time.  Psychiatric:        Mood and Affect: Mood normal.     ED Results / Procedures / Treatments   Labs (all labs ordered  are listed, but only abnormal results are displayed) Labs Reviewed  CBC - Abnormal; Notable for the following components:      Result Value   WBC 11.3 (*)    RBC 3.71 (*)    Hemoglobin 11.0 (*)    All other components within normal limits  BLOOD GAS, VENOUS - Abnormal; Notable for the following components:   pH, Ven 7.08 (*)    pCO2, Ven 18 (*)    Bicarbonate 5.3 (*)    Acid-base deficit 22.8 (*)    All other components within normal limits  CBG MONITORING, ED - Abnormal; Notable for the following components:   Glucose-Capillary 596 (*)  All other components within normal limits  CBG MONITORING, ED - Abnormal; Notable for the following components:   Glucose-Capillary >600 (*)    All other components within normal limits  URINALYSIS, ROUTINE W REFLEX MICROSCOPIC  LIPASE, BLOOD  COMPREHENSIVE METABOLIC PANEL  BETA-HYDROXYBUTYRIC ACID  BASIC METABOLIC PANEL  BASIC METABOLIC PANEL  BASIC METABOLIC PANEL  BASIC METABOLIC PANEL  CBG MONITORING, ED  TROPONIN I (HIGH SENSITIVITY)    EKG EKG Interpretation Date/Time:  Sunday July 31 2023 09:42:25 EDT Ventricular Rate:  84 PR Interval:  269 QRS Duration:  135 QT Interval:  436 QTC Calculation: 516 R Axis:   -68  Text Interpretation: Sinus rhythm Prolonged PR interval Nonspecific IVCD with LAD Left ventricular hypertrophy Inferior infarct, acute (RCA) Anterior Q waves, possibly due to LVH Probable RV involvement, suggest recording right precordial leads no chest pain Confirmed by Vanetta Mulders [phone]) on 07/31/2023 9:54:11 AM  Radiology No results found.  Procedures Procedures    Medications Ordered in ED Medications  lactated ringers bolus 1,000 mL (has no administration in time range)  lactated ringers infusion (has no administration in time range)  lactated ringers bolus 1,098 mL (has no administration in time range)    ED Course/ Medical Decision Making/ A&P                                 Medical  Decision Making Amount and/or Complexity of Data Reviewed Labs: ordered. Radiology: ordered.  Risk Prescription drug management. Decision regarding hospitalization.   Clinically concerning for possible DKA.  Patient's venous blood gas came back with a pH of 7.08.  So this is consistent with DKA.  Beta hydroxybutyrate acid still pending.  White blood cell count 11.3 hemoglobin 11.  Platelets 247.  Her blood sugar here was greater than 600.  Her EKG raise some concerns of possible inferior MI.  But otherwise not significantly different from her previous EKGs.  Patient has no complaint of chest pain.  But will send troponins.  Feel that the changes probably are related to the low pH.  IV fluids ordered insulin drip ordered.  Based on the low pH patient may require admission to ICU.   Urinalysis and chest x-ray is still pending.  Electrolytes still pending.  Patient may require some IV potassium as well.  CRITICAL CARE Performed by: Vanetta Mulders Total critical care time: 60 minutes Critical care time was exclusive of separately billable procedures and treating other patients. Critical care was necessary to treat or prevent imminent or life-threatening deterioration. Critical care was time spent personally by me on the following activities: development of treatment plan with patient and/or surrogate as well as nursing, discussions with consultants, evaluation of patient's response to treatment, examination of patient, obtaining history from patient or surrogate, ordering and performing treatments and interventions, ordering and review of laboratory studies, ordering and review of radiographic studies, pulse oximetry and re-evaluation of patient's condition.   Discussed with on-call critical care.  They feel the hospitalist can do the admission.  Will contact them.   Final Clinical Impression(s) / ED Diagnoses Final diagnoses:  Diabetic ketoacidosis without coma associated with type 1  diabetes mellitus [facility])    Rx / DC Orders ED Discharge Orders     None         Vanetta Mulders, MD 07/31/23 1011    Vanetta Mulders, MD 07/31/23 1125

## 2023-07-31 NOTE — ED Notes (Addendum)
The patient had ultrasound IV access. The LR bolus was started. Moments later, swelling was noted in the area. Same had infiltrated. Fluids were paused. The order was placed for the IV team

## 2023-07-31 NOTE — ED Triage Notes (Signed)
Patient brought in by EMS due to high blood sugar. Reports this has been going on for the past 2 days. Pt reports weakness and started having vomiting upon arrival to the ER.

## 2023-07-31 NOTE — H&P (Signed)
History and Physical    Patient: [name] MWN:[id number] DOB: June 23, 1935 DOA: 07/31/2023 DOS: the patient was seen and examined on 07/31/2023 PCP: [name], MD  Patient coming from: Home  Chief Complaint:  Chief Complaint  Patient presents with   Hyperglycemia   HPI: [name] is a 88 y.o. female with medical history significant of anemia, arthritis, colon polyps, heart murmur, hyperlipidemia, hypertension, hypothyroidism, history of pneumonia, type 1 diabetes who presented emergency department complaints of hyperglycemia for the past 2 days associated with weakness and an episode of emesis in triage.  The patient was not vomiting at home.  She is oriented to name, she knows she is in the hospital but thought she was at [facility] and is disoriented to time/day.  She is able to answer simple questions.  No headaches, dyspnea, chest, back or abdominal pain at the moment.  Lab work: CBC 0 white count 11.3, hemoglobin 11.0 g deciliter platelets 247.  Venous blood gas with a pH of 7.08, pCO2 18 and pO2 of 45 mmHg.  Bicarbonate was 5.3 and acid-base deficit 22.8 mmol/L.  Beta-hydroxybutyrate [place] was greater than 8.00 mmol/L.  Lipase was normal.  CMP showed a 728, potassium 6.1, chloride 94 and CO2 less than 7 mmol/L with an not calculated anion gap.  Total bilirubin 1.9 glucose 699, BUN 28 and creatinine 1.78 mg/dL.  AST 136 and alkaline phosphatase 84 units/L.  The rest of the LFTs were normal.  Imaging: Portable 1 view chest radiograph with low lung volume.  No active disease.   ED course: Initial vital signs were temperature 97.5, pulse 90, respiration 18, BP 123/50 mmHg and O2 sat 99% on room air.  The patient received 2000 mL of LR bolus and was started on an insulin infusion.  Review of Systems: As mentioned in the history of present illness. All other systems reviewed and are negative.  Past Medical History:  Diagnosis Date   Anemia    Arthritis    Colon polyp    Diabetes  mellitus    type 1   Heart murmur    Hyperlipidemia    Hypertension    Hypothyroidism    Pneumonia    Poor appetite    Thyroid disease    Past Surgical History:  Procedure Laterality Date   ABDOMINAL HYSTERECTOMY     APPENDECTOMY     BIOPSY  02/07/2022   Procedure: BIOPSY;  Surgeon: Meryl Dare, MD;  Location: [facility] ENDOSCOPY;  Service: Gastroenterology;;   BREAST EXCISIONAL BIOPSY Right    CARDIOVASCULAR STRESS TEST  07/15/2006   Normal scan, no ECG changes   COLONOSCOPY WITH PROPOFOL N/A 02/07/2022   Procedure: COLONOSCOPY WITH PROPOFOL;  Surgeon: Meryl Dare, MD;  Location: [facility] ENDOSCOPY;  Service: Gastroenterology;  Laterality: N/A;   CYSTOSCOPY WITH BIOPSY Bilateral 11/19/2022   Procedure: CYSTOSCOPY WITH TRANSURETHRAL RESECTION OF BLADDER TUMOR BILATERAL RETROGRADE PYELOGRAM;  Surgeon: Crista Elliot, MD;  Location: [facility] ORS;  Service: Urology;  Laterality: Bilateral;  1 HR FOR CASE   ESOPHAGOGASTRODUODENOSCOPY (EGD) WITH PROPOFOL N/A 02/07/2022   Procedure: ESOPHAGOGASTRODUODENOSCOPY (EGD) WITH PROPOFOL;  Surgeon: Meryl Dare, MD;  Location: [facility] ENDOSCOPY;  Service: Gastroenterology;  Laterality: N/A;   TRANSTHORACIC ECHOCARDIOGRAM  01/08/2011   EF 60-65%, moderae mitral regurg, LA mild-moderately dilated,   TRANSURETHRAL RESECTION OF BLADDER TUMOR N/A 04/04/2023   Procedure: TRANSURETHRAL RESECTION OF BLADDER TUMOR (TURBT);  Surgeon: Crista Elliot, MD;  Location: [facility] ORS;  Service: Urology;  Laterality:  N/A;  1 HR FOR CASE   Social History:  reports that she has quit smoking. Her smoking use included cigarettes. She has never used smokeless tobacco. She reports that she does not drink alcohol and does not use drugs.  No Known Allergies  Family History  Problem Relation Age of Onset   Diabetes Mother    Hypertension Father    Hyperlipidemia Father    Diabetes Sister    Hypertension Sister    Hyperlipidemia Sister    Hypertension Brother    Stroke Sister     Hypertension Sister    Diabetes Sister    Cancer Daughter    Heart failure Child    Breast cancer Neg Hx     Prior to Admission medications   Medication Sig Start Date End Date Taking? Authorizing Provider  acetaminophen (TYLENOL) 500 MG tablet Take 500 mg by mouth every 6 (six) hours as needed for moderate pain (pain score 4-6).   Yes [provider]  amiodarone (PACERONE) 100 MG tablet Take 1 tablet (100 mg total) by mouth daily. 06/15/23  Yes Wittenborn, Gavin Pound, NP  apixaban (ELIQUIS) 2.5 MG TABS tablet TAKE 1 TABLET BY MOUTH TWICE A DAY 02/06/23  Yes Hilty, Lisette Abu, MD  Ascorbic Acid (VITAMIN C) 100 MG tablet Take 100 mg by mouth as needed.   Yes [provider]  Calcium Polycarbophil (FIBER-CAPS PO) Take 1 capsule by mouth daily as needed (constipation).   Yes [provider]  Dulaglutide (TRULICITY) 0.75 MG/0.5ML SOPN Inject 0.75 mg into the skin every Sunday.   Yes [provider]  furosemide (LASIX) 20 MG tablet Take 1 tablet (20 mg total) by mouth daily as needed for fluid or edema. 03/24/23  Yes Wittenborn, Deborah, NP  insulin aspart (NOVOLOG FLEXPEN) 100 UNIT/ML FlexPen Inject 8 Units into the skin 3 (three) times daily with meals. Patient taking differently: Inject 2-8 Units into the skin 3 (three) times daily as needed for high blood sugar. 09/04/21  Yes Narda Bonds, MD  insulin glargine, 2 Unit Dial, (TOUJEO MAX SOLOSTAR) 300 UNIT/ML Solostar Pen Inject 14 Units into the skin at bedtime. Patient taking differently: Inject 11 Units into the skin at bedtime. 05/04/22  Yes Gonfa, Boyce Medici, MD  KLOR-CON M20 20 MEQ tablet Take 20 mEq by mouth daily. 03/22/23  Yes [provider]  levothyroxine (SYNTHROID) 112 MCG tablet Take 112 mcg by mouth daily before breakfast.   Yes [provider]  mirtazapine (REMERON) 30 MG tablet Take 30 mg by mouth at bedtime.   Yes [provider]  nitrofurantoin, macrocrystal-monohydrate,  (MACROBID) 100 MG capsule Take 100 mg by mouth at bedtime. 07/13/23  Yes [provider]  pantoprazole (PROTONIX) 40 MG tablet Take 1 tablet (40 mg total) by mouth daily at 6 (six) AM. 02/09/22  Yes Osvaldo Shipper, MD  TIADYLT ER 180 MG 24 hr capsule TAKE 1 CAPSULE BY MOUTH EVERY DAY 11/01/22  Yes Hilty, Lisette Abu, MD  trimethoprim (TRIMPEX) 100 MG tablet Take 100 mg by mouth daily. 10/27/22  Yes [provider]    Physical Exam: Vitals:   07/31/23 0805 07/31/23 0806 07/31/23 0852  BP:  (!) 123/50 (!) 135/54  Pulse:  90 86  Resp:  18 (!) 22  Temp:  (!) 97.5 F (36.4 C) (!) 97.4 F (36.3 C)  TempSrc:  Oral Oral  SpO2:  99% 100%  Weight: 54.9 kg    Height: 5\' 2"  (1.575 m)  Physical Exam Vitals and nursing note reviewed.  Constitutional:      General: She is awake. She is not in acute distress.    Appearance: Normal appearance. She is normal weight. She is ill-appearing.  HENT:     Head: Normocephalic.     Nose: No rhinorrhea.     Mouth/Throat:     Mouth: Mucous membranes are dry.  Eyes:     General: No scleral icterus.    Pupils: Pupils are equal, round, and reactive to light.  Neck:     Vascular: No JVD.  Cardiovascular:     Rate and Rhythm: Normal rate and regular rhythm.     Heart sounds: S1 normal and S2 normal.  Pulmonary:     Effort: Pulmonary effort is normal.     Breath sounds: Normal breath sounds. No wheezing, rhonchi or rales.  Abdominal:     General: Bowel sounds are normal. There is no distension.     Palpations: Abdomen is soft.     Tenderness: There is no abdominal tenderness.  Musculoskeletal:     Cervical back: Neck supple.     Right lower leg: No edema.     Left lower leg: No edema.  Skin:    General: Skin is warm and dry.  Neurological:     Mental Status: She is alert. She is disoriented.  Psychiatric:        Mood and Affect: Mood normal.        Behavior: Behavior normal. Behavior is cooperative.     Data  Reviewed:  Results are pending, will review when available. 09/01/2021 echocardiogram IMPRESSIONS:   1. Left ventricular ejection fraction, by estimation, is 65 to 70%. The left ventricle has normal function. The left ventricle has no regional wall motion abnormalities. There is mild left ventricular hypertrophy. Left ventricular diastolic parameters are indeterminate.  2. Right ventricular systolic function is mildly reduced. The right ventricular size is normal. There is normal pulmonary artery systolic pressure. The estimated right ventricular systolic pressure is 31.7 mmHg.  3. Left atrial size was mildly dilated. The atrial septum bows right suggesting LA pressure overload.  4. The mitral valve is normal in structure. Trivial mitral valve regurgitation. No evidence of mitral stenosis.  5. The aortic valve is tricuspid. Aortic valve regurgitation is not visualized. Aortic valve sclerosis/calcification is present, without any evidence of aortic stenosis.  6. The inferior vena cava is normal in size with greater than 50% respiratory variability, suggesting right atrial pressure of 3 mmHg.  7. The patient was in atrial fibrillation.  EKG: Vent. rate 84 BPM PR interval 269 ms QRS duration 135 ms QT/QTcB 436/516 ms P-R-T axes 32 -68 84 Sinus rhythm Prolonged PR interval Nonspecific IVCD with LAD Left ventricular hypertrophy Inferior infarct, acute (RCA) Anterior Q waves, possibly due to LVH  Assessment and Plan: Principal Problem:   LADA (latent autoimmune diabetes in adults), managed as type 1 (HCC) Presenting with:   DKA (diabetic ketoacidosis) (HCC) With associated:   Pseudohyponatremia Observation/stepdown. Keep NPO. -May have ice chips and sips of water. Continue IV fluids. Continue insulin infusion. Monitor CBG closely. BMP every 4 hours. BHA every 8 hours. Replace electrolytes as needed. Consult diabetes coordinator. Transition to SQ insulin per Endo  tool.  Active Problems:   PAF (paroxysmal atrial fibrillation) (HCC) Continue amiodarone 100 mg p.o. daily. Continue apixaban 2.5 mg p.o. twice daily. Will resume home diltiazem in the morning.    Stage 3b chronic kidney disease (CKD) (HCC) Monitor  renal function and electrolytes.    Postablative hypothyroidism Continue levothyroxine 112 mcg p.o. daily.    Pressure injury of skin Continue local care and preventive measures.    History of recurrent UTIs Continue trimethoprim 100 mg p.o. daily. Hold nitrofurantoin until renal function improves.    Advance Care Planning:   Code Status: Full Code   Consults:   Family Communication:   Severity of Illness: The appropriate patient status for this patient is INPATIENT. Inpatient status is judged to be reasonable and necessary in order to provide the required intensity of service to ensure the patient's safety. The patient's presenting symptoms, physical exam findings, and initial radiographic and laboratory data in the context of their chronic comorbidities is felt to place them at high risk for further clinical deterioration. Furthermore, it is not anticipated that the patient will be medically stable for discharge from the hospital within 2 midnights of admission.   * I certify that at the point of admission it is my clinical judgment that the patient will require inpatient hospital care spanning beyond 2 midnights from the point of admission due to high intensity of service, high risk for further deterioration and high frequency of surveillance required.*  Author: Bobette Mo, MD 07/31/2023 11:58 AM  For on call review [email or web address].   This document was prepared using Dragon voice recognition software and may contain some unintended transcription errors.

## 2023-07-31 NOTE — ED Notes (Signed)
Attempted labs unable to obtain

## 2023-08-01 ENCOUNTER — Inpatient Hospital Stay (HOSPITAL_COMMUNITY): Payer: Medicare PPO

## 2023-08-01 DIAGNOSIS — Z87440 Personal history of urinary (tract) infections: Secondary | ICD-10-CM | POA: Diagnosis not present

## 2023-08-01 DIAGNOSIS — N1832 Chronic kidney disease, stage 3b: Secondary | ICD-10-CM | POA: Diagnosis not present

## 2023-08-01 DIAGNOSIS — M7989 Other specified soft tissue disorders: Secondary | ICD-10-CM

## 2023-08-01 DIAGNOSIS — E139 Other specified diabetes mellitus without complications: Secondary | ICD-10-CM | POA: Diagnosis not present

## 2023-08-01 DIAGNOSIS — E890 Postprocedural hypothyroidism: Secondary | ICD-10-CM

## 2023-08-01 DIAGNOSIS — I480 Paroxysmal atrial fibrillation: Secondary | ICD-10-CM

## 2023-08-01 DIAGNOSIS — R7989 Other specified abnormal findings of blood chemistry: Secondary | ICD-10-CM

## 2023-08-01 LAB — COMPREHENSIVE METABOLIC PANEL
ALT: 183 U/L — ABNORMAL HIGH (ref 0–44)
ALT: 176 U/L — ABNORMAL HIGH (ref 0–44)
ALT: 176 U/L — ABNORMAL HIGH (ref 0–44)
AST: 292 U/L — ABNORMAL HIGH (ref 15–41)
AST: 262 U/L — ABNORMAL HIGH (ref 15–41)
AST: 238 U/L — ABNORMAL HIGH (ref 15–41)
Albumin: 2.5 g/dL — ABNORMAL LOW (ref 3.5–5.0)
Albumin: 2.5 g/dL — ABNORMAL LOW (ref 3.5–5.0)
Albumin: 2.5 g/dL — ABNORMAL LOW (ref 3.5–5.0)
Alkaline Phosphatase: 88 U/L (ref 38–126)
Alkaline Phosphatase: 80 U/L (ref 38–126)
Alkaline Phosphatase: 87 U/L (ref 38–126)
Anion gap: 10 (ref 5–15)
Anion gap: 12 (ref 5–15)
Anion gap: 9 (ref 5–15)
BUN: 21 mg/dL (ref 8–23)
BUN: 19 mg/dL (ref 8–23)
BUN: 15 mg/dL (ref 8–23)
CO2: 19 mmol/L — ABNORMAL LOW (ref 22–32)
CO2: 18 mmol/L — ABNORMAL LOW (ref 22–32)
CO2: 18 mmol/L — ABNORMAL LOW (ref 22–32)
Calcium: 8.3 mg/dL — ABNORMAL LOW (ref 8.9–10.3)
Calcium: 8.5 mg/dL — ABNORMAL LOW (ref 8.9–10.3)
Calcium: 8.1 mg/dL — ABNORMAL LOW (ref 8.9–10.3)
Chloride: 103 mmol/L (ref 98–111)
Chloride: 107 mmol/L (ref 98–111)
Chloride: 108 mmol/L (ref 98–111)
Creatinine, Ser: 1.18 mg/dL — ABNORMAL HIGH (ref 0.44–1.00)
Creatinine, Ser: 1.03 mg/dL — ABNORMAL HIGH (ref 0.44–1.00)
Creatinine, Ser: 0.95 mg/dL (ref 0.44–1.00)
GFR, Estimated: 45 mL/min — ABNORMAL LOW (ref >60)
GFR, Estimated: 53 mL/min — ABNORMAL LOW (ref >60)
GFR, Estimated: 58 mL/min — ABNORMAL LOW (ref >60)
Glucose, Bld: 148 mg/dL — ABNORMAL HIGH (ref 70–99)
Glucose, Bld: 163 mg/dL — ABNORMAL HIGH (ref 70–99)
Glucose, Bld: 163 mg/dL — ABNORMAL HIGH (ref 70–99)
Potassium: 3.9 mmol/L (ref 3.5–5.1)
Potassium: 4.1 mmol/L (ref 3.5–5.1)
Potassium: 3.9 mmol/L (ref 3.5–5.1)
Sodium: 132 mmol/L — ABNORMAL LOW (ref 135–145)
Sodium: 137 mmol/L (ref 135–145)
Sodium: 135 mmol/L (ref 135–145)
Total Bilirubin: 0.6 mg/dL (ref 0.3–1.2)
Total Bilirubin: 0.7 mg/dL (ref 0.3–1.2)
Total Bilirubin: 0.6 mg/dL (ref 0.3–1.2)
Total Protein: 5.4 g/dL — ABNORMAL LOW (ref 6.5–8.1)
Total Protein: 5.4 g/dL — ABNORMAL LOW (ref 6.5–8.1)
Total Protein: 5.8 g/dL — ABNORMAL LOW (ref 6.5–8.1)

## 2023-08-01 LAB — CBC
HCT: 28.9 % — ABNORMAL LOW (ref 36.0–46.0)
Hemoglobin: 9.3 g/dL — ABNORMAL LOW (ref 12.0–15.0)
MCH: 29.1 pg (ref 26.0–34.0)
MCHC: 32.2 g/dL (ref 30.0–36.0)
MCV: 90.3 fL (ref 80.0–100.0)
Platelets: 178 10*3/uL (ref 150–400)
RBC: 3.2 MIL/uL — ABNORMAL LOW (ref 3.87–5.11)
RDW: 14.6 % (ref 11.5–15.5)
WBC: 12.8 10*3/uL — ABNORMAL HIGH (ref 4.0–10.5)
nRBC: 0 % (ref 0.0–0.2)

## 2023-08-01 LAB — URINALYSIS, COMPLETE (UACMP) WITH MICROSCOPIC
Bilirubin Urine: NEGATIVE
Glucose, UA: >=500 mg/dL — AB
Ketones, ur: 5 mg/dL — AB
Nitrite: NEGATIVE
Protein, ur: 100 mg/dL — AB
RBC / HPF: >50 RBC/hpf (ref 0–5)
Specific Gravity, Urine: 1.015 (ref 1.005–1.030)
WBC, UA: >50 WBC/hpf (ref 0–5)
pH: 6 (ref 5.0–8.0)

## 2023-08-01 LAB — GLUCOSE, CAPILLARY
Glucose-Capillary: 102 mg/dL — ABNORMAL HIGH (ref 70–99)
Glucose-Capillary: 107 mg/dL — ABNORMAL HIGH (ref 70–99)
Glucose-Capillary: 115 mg/dL — ABNORMAL HIGH (ref 70–99)
Glucose-Capillary: 125 mg/dL — ABNORMAL HIGH (ref 70–99)
Glucose-Capillary: 125 mg/dL — ABNORMAL HIGH (ref 70–99)
Glucose-Capillary: 129 mg/dL — ABNORMAL HIGH (ref 70–99)
Glucose-Capillary: 133 mg/dL — ABNORMAL HIGH (ref 70–99)
Glucose-Capillary: 135 mg/dL — ABNORMAL HIGH (ref 70–99)
Glucose-Capillary: 139 mg/dL — ABNORMAL HIGH (ref 70–99)
Glucose-Capillary: 144 mg/dL — ABNORMAL HIGH (ref 70–99)
Glucose-Capillary: 146 mg/dL — ABNORMAL HIGH (ref 70–99)
Glucose-Capillary: 146 mg/dL — ABNORMAL HIGH (ref 70–99)
Glucose-Capillary: 148 mg/dL — ABNORMAL HIGH (ref 70–99)
Glucose-Capillary: 149 mg/dL — ABNORMAL HIGH (ref 70–99)
Glucose-Capillary: 151 mg/dL — ABNORMAL HIGH (ref 70–99)
Glucose-Capillary: 152 mg/dL — ABNORMAL HIGH (ref 70–99)
Glucose-Capillary: 156 mg/dL — ABNORMAL HIGH (ref 70–99)
Glucose-Capillary: 177 mg/dL — ABNORMAL HIGH (ref 70–99)
Glucose-Capillary: 181 mg/dL — ABNORMAL HIGH (ref 70–99)
Glucose-Capillary: 188 mg/dL — ABNORMAL HIGH (ref 70–99)
Glucose-Capillary: 201 mg/dL — ABNORMAL HIGH (ref 70–99)
Glucose-Capillary: 231 mg/dL — ABNORMAL HIGH (ref 70–99)

## 2023-08-01 LAB — CBC WITH DIFFERENTIAL/PLATELET
Abs Immature Granulocytes: 0.1 10*3/uL — ABNORMAL HIGH (ref 0.00–0.07)
Basophils Absolute: 0.1 10*3/uL (ref 0.0–0.1)
Basophils Relative: 1 %
Eosinophils Absolute: 0.1 10*3/uL (ref 0.0–0.5)
Eosinophils Relative: 1 %
HCT: 29 % — ABNORMAL LOW (ref 36.0–46.0)
Hemoglobin: 9.6 g/dL — ABNORMAL LOW (ref 12.0–15.0)
Immature Granulocytes: 1 %
Lymphocytes Relative: 11 %
Lymphs Abs: 1 10*3/uL (ref 0.7–4.0)
MCH: 28.8 pg (ref 26.0–34.0)
MCHC: 33.1 g/dL (ref 30.0–36.0)
MCV: 87.1 fL (ref 80.0–100.0)
Monocytes Absolute: 0.8 10*3/uL (ref 0.1–1.0)
Monocytes Relative: 9 %
Neutro Abs: 7.3 10*3/uL (ref 1.7–7.7)
Neutrophils Relative %: 77 %
Platelets: 170 10*3/uL (ref 150–400)
RBC: 3.33 MIL/uL — ABNORMAL LOW (ref 3.87–5.11)
RDW: 14.6 % (ref 11.5–15.5)
WBC: 9.4 10*3/uL (ref 4.0–10.5)
nRBC: 0 % (ref 0.0–0.2)

## 2023-08-01 LAB — BASIC METABOLIC PANEL
Anion gap: 8 (ref 5–15)
BUN: 23 mg/dL (ref 8–23)
CO2: 21 mmol/L — ABNORMAL LOW (ref 22–32)
Calcium: 8.1 mg/dL — ABNORMAL LOW (ref 8.9–10.3)
Chloride: 102 mmol/L (ref 98–111)
Creatinine, Ser: 1.07 mg/dL — ABNORMAL HIGH (ref 0.44–1.00)
GFR, Estimated: 51 mL/min — ABNORMAL LOW (ref >60)
Glucose, Bld: 172 mg/dL — ABNORMAL HIGH (ref 70–99)
Potassium: 3.8 mmol/L (ref 3.5–5.1)
Sodium: 131 mmol/L — ABNORMAL LOW (ref 135–145)

## 2023-08-01 LAB — BETA-HYDROXYBUTYRIC ACID
Beta-Hydroxybutyric Acid: 1.19 mmol/L — ABNORMAL HIGH (ref 0.05–0.27)
Beta-Hydroxybutyric Acid: 0.86 mmol/L — ABNORMAL HIGH (ref 0.05–0.27)
Beta-Hydroxybutyric Acid: 0.25 mmol/L (ref 0.05–0.27)

## 2023-08-01 LAB — PHOSPHORUS
Phosphorus: 2.2 mg/dL — ABNORMAL LOW (ref 2.5–4.6)
Phosphorus: 2.3 mg/dL — ABNORMAL LOW (ref 2.5–4.6)

## 2023-08-01 LAB — MAGNESIUM
Magnesium: 1.9 mg/dL (ref 1.7–2.4)
Magnesium: 1.9 mg/dL (ref 1.7–2.4)

## 2023-08-01 MED ORDER — SODIUM PHOSPHATES 45 MMOLE/15ML IV SOLN
15.0000 mmol | Freq: Once | INTRAVENOUS | Status: AC
  Administered 2023-08-01: 15 mmol via INTRAVENOUS
  Filled 2023-08-01: qty 5

## 2023-08-01 MED ORDER — INSULIN ASPART 100 UNIT/ML IJ SOLN
0.0000 [IU] | Freq: Three times a day (TID) | INTRAMUSCULAR | Status: DC
  Administered 2023-08-02: 3 [IU] via SUBCUTANEOUS

## 2023-08-01 MED ORDER — SODIUM CHLORIDE 0.9 % IV BOLUS
1000.0000 mL | Freq: Once | INTRAVENOUS | Status: AC
  Administered 2023-08-01: 1000 mL via INTRAVENOUS

## 2023-08-01 MED ORDER — LACTATED RINGERS IV SOLN: INTRAVENOUS | Status: DC

## 2023-08-01 MED ORDER — INSULIN ASPART 100 UNIT/ML IJ SOLN: 0.0000 [IU] | Freq: Every day | INTRAMUSCULAR | Status: DC

## 2023-08-01 MED ORDER — INSULIN GLARGINE-YFGN 100 UNIT/ML ~~LOC~~ SOLN
11.0000 [IU] | Freq: Every day | SUBCUTANEOUS | Status: DC
  Administered 2023-08-02 – 2023-08-04 (×4): 11 [IU] via SUBCUTANEOUS
  Filled 2023-08-01 (×4): qty 0.11

## 2023-08-01 MED ORDER — DEXTROSE IN LACTATED RINGERS 5 % IV SOLN: INTRAVENOUS | Status: DC

## 2023-08-01 MED ORDER — INSULIN ASPART 100 UNIT/ML IJ SOLN
4.0000 [IU] | Freq: Three times a day (TID) | INTRAMUSCULAR | Status: DC
  Administered 2023-08-02 – 2023-08-04 (×7): 4 [IU] via SUBCUTANEOUS

## 2023-08-01 NOTE — Plan of Care (Signed)
  Problem: Nutritional: Goal: Maintenance of adequate nutrition will improve 08/01/2023 1558 by Micki Riley, RN Outcome: Not Progressing 08/01/2023 1557 by Micki Riley, RN Outcome: Progressing Goal: Progress toward achieving an optimal weight will improve 08/01/2023 1558 by Micki Riley, RN Outcome: Not Progressing 08/01/2023 1557 by Micki Riley, RN Outcome: Progressing   Problem: Nutritional: Goal: Maintenance of adequate nutrition will improve 08/01/2023 1558 by Micki Riley, RN Outcome: Not Progressing 08/01/2023 1557 by Micki Riley, RN Outcome: Progressing Goal: Maintenance of adequate weight for body size and type will improve 08/01/2023 1558 by Micki Riley, RN Outcome: Not Progressing 08/01/2023 1557 by Micki Riley, RN Outcome: Progressing   Problem: Urinary Elimination: Goal: Ability to achieve and maintain adequate renal perfusion and functioning will improve 08/01/2023 1558 by Micki Riley, RN Outcome: Not Progressing 08/01/2023 1557 by Micki Riley, RN Outcome: Progressing

## 2023-08-01 NOTE — Hospital Course (Signed)
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  She is also noted to be disoriented and further workup was done and revealed abnormal LFTs and DKA.  She is admitted placed on insulin drip and given IV fluid hydration  **Interim History She continues to feel weak and her gap is not closed yet but her beta hydroxybutyrate acid has improved and resolved.  She was transitioned to long-acting insulin regimen overnight and PT OT evaluated and recommending home health.  Nutritionist evaluated for her poor p.o. intake and have recommended supplements.  She was transferred to the medical floor given her stability and anticipate discharge in next 24 to 48 hours if she continues to further improve  Assessment and Plan:  LADA (latent autoimmune diabetes in adults), managed as type 1 (HCC) presenting as  DKA (diabetic ketoacidosis) (HCC) With associated Pseudohyponatremia -Monitor CBG closely. CBG Trend: Recent Labs  Lab 08/02/23 0143 08/02/23 0244 08/02/23 0742 08/02/23 1149 08/02/23 1151 08/02/23 1232 08/02/23 1616  GLUCAP 133* 93 201* 61* 58* 89 244*  -C/w BMP every 4 hours and BHA every 8 hours. -Her beta hydroxybutyrate acid went from >8.00 -> 4.31 -> 1.19 -> 0.86 and is now 0.25 -Replace electrolytes as needed. -Consulted diabetes coordinator. -WBC Trend: Recent Labs  Lab 07/12/23 2039 07/31/23 0930 08/01/23 0152 08/01/23 1143 08/02/23 0315  WBC 7.7 11.3* 12.8* 9.4 6.3  -Given her improvement she was transitioned to on Semglee 11 units daily and continue insulin drip for 2 hours after Semglee is given and then DC the insulin drip.  Will also add a NovoLog sensitive sliding scale  before meals and at bedtime and start meal coverage if she is allowed to eat with 4 units of NovoLog 3 times daily with meals and hold for n.p.o. and hold if she eats less than 50% of her meals -Diabetes education coordinator spoke with the endocrinology team at [facility] and the plan is to stop the Trulicity for now and will not continue at discharge   PAF (paroxysmal atrial fibrillation) (HCC) -Continue amiodarone 100 mg p.o. daily. -Continue apixaban 2.5 mg p.o. twice daily. -BP soft so hold Antihypertensives for now and likely resume in the a.m. -Continue to Monitor on Telemetry   AKI on Stage 3b chronic kidney disease (CKD) (HCC), improved  Metabolic Acidosis -BUN/Cr Trend: Recent Labs  Lab 07/31/23 1809 07/31/23 2133 08/01/23 0152 08/01/23 0705 08/01/23 1143 08/01/23 1854 08/02/23 0315  BUN 27* 24* 23 21 19 15 13   CREATININE 1.51* 1.25* 1.07* 1.18* 1.03* 0.95 0.91  -Avoid Nephrotoxic Medications, Contrast Dyes, Hypotension and Dehydration to Ensure Adequate Renal Perfusion and will need to Renally Adjust Meds -IVF now stopped -Has a metabolic acidosis with a CO2 of 18, chloride level of 70, anion gap of 110; replete with p.o. sodium bicarbonate 650 mg p.o. twice daily -Continue to Monitor and Trend Renal Function carefully and repeat CMP in the AM   Postablative Hypothyroidism -Continue Levothyroxine 112 mcg p.o. daily.  Abnormal LFTs and Hyperbilirubinemia, improving slowly -AST, ALT, and Bilirubin Trend: Recent Labs  Lab 07/12/23 2039 07/31/23 0930 08/01/23 0705 08/01/23 1143 08/01/23 1854 08/02/23 0315  AST 35 136* 292* 262* 238* 173*  ALT 50* 84* 183* 176* 176* 150*  BILITOT 0.9 1.9*  0.6 0.7 0.6 0.5  -Likely reactive in the setting of significant dehydration -If not improving will obtain right upper quadrant ultrasound and acute hepatitis panel in the morning -Continue monitor and trend and repeat CMP in the AM  Hypophosphatemia, improved  -Phos Level Trend  has gone from 2.2 -> 2.3 -> 3.0 -Continue to Monitor and Replete as Necessary  -Repeat Phos Level in the AM  Hypokalemia -Patient's K+ Level Trend: Recent Labs  Lab 07/31/23 1809 07/31/23 2133 08/01/23 0152 08/01/23 0705 08/01/23 1143 08/01/23 1854 08/02/23 0315  K 4.4 4.4 3.8 3.9 4.1 3.9 3.3*  -Replete with po Kcl 40 mEQ BID x2 -Continue to Monitor and Replete as Necessary -Repeat CMP in the AM   Normocytic Anemia -Likely Hemoconcentration on admission and now Dilutional Drop -Hgb/Hct Trend: Recent Labs  Lab 07/12/23 2039 07/31/23 0930 07/31/23 1047 08/01/23 0152 08/01/23 1143 08/02/23 0315  HGB 10.5* 11.0* 13.3 9.3* 9.6* 10.1*  HCT 32.4* 36.6 39.0 28.9* 29.0* 31.8*  MCV 86.4 98.7  --  90.3 87.1 91.9  -Check Anemia Panel in the AM  -Continue to Monitor for S/Sx of Bleeding; No overt bleeding noted -Repeat CBC in the AM    Pressure injury of skin, poA Pressure Injury 07/31/23 Coccyx Medial;Mid Stage 2 -  Partial thickness loss of dermis presenting as a shallow open injury with a red, pink wound bed without slough. (Active)  07/31/23 1418  Location: Coccyx  Location Orientation: Medial;Mid  Staging: Stage 2 -  Partial thickness loss of dermis presenting as a shallow open injury with a red, pink wound bed without slough.  Wound Description (Comments):   Present on Admission: Yes  -WOC Nurse Consult   History of Recurrent UTIs -Continue trimethoprim 100 mg p.o. daily. -Hold nitrofurantoin until renal function improves. -U/A done and showed hazy appearance with red color greater than 500 glucose, large hemoglobin, 5 ketones, trace leukocytes, negative nitrites, rare bacteria, 50 RBCs per high-powered field, 0-5 squamous epithelial cells, and greater than 50 WBCs -Urine cultures showing less than 10,000 colony forming units of insignificant growth date but will empirically place on IV ceftriaxone given that she is been on trimethoprim and nitrofurantoin and consider  treating for 5 Days  Right Arm Swelling -Likely in setting of prior IV -Check RUQ Duplex and showed "No evidence of deep vein thrombosis in the upper extremity. No evidence of superficial vein thrombosis in the upper extremity. " -C/w Elevation  Generalized Weakness -In the Setting of DKA -IVF Hydration to stop today  -PT/OT to evaluate and Treat and recommending Home Health   Hypoalbuminemia -Patient's Albumin Trend ranging from 2.4-3.5 -Continue to Monitor and Trend and repeat CMP in the AM  Moderate Protein Calorie Malnutrition Nutrition Status: Nutrition Problem: Moderate Malnutrition Etiology: chronic illness Signs/Symptoms: mild fat depletion, moderate muscle depletion, percent weight loss (12% weight loss in 1.5 months) Percent weight loss: 12 % (in 1.5 months) Interventions: Refer to RD note for recommendations, Glucerna shake, MVI

## 2023-08-01 NOTE — Progress Notes (Signed)
PROGRESS NOTE    [name]  UVO:[id number] DOB: [birth year] DOA: 07/31/2023 PCP: [name]   Brief Narrative:  HPI per [name]   [name] is a 88 y.o. female with medical history significant of anemia, arthritis, colon polyps, heart murmur, hyperlipidemia, hypertension, hypothyroidism, history of pneumonia, type 1 diabetes who presented emergency department complaints of hyperglycemia for the past 2 days associated with weakness and an episode of emesis in triage.  The patient was not vomiting at home.  She is oriented to name, she knows she is in the hospital but thought she was at [facility] and is disoriented to time/day.  She is able to answer simple questions.  No headaches, dyspnea, chest, back or abdominal pain at the moment.   Lab work: CBC 0 white count 11.3, hemoglobin 11.0 g deciliter platelets 247.  Venous blood gas with a pH of 7.08, pCO2 18 and pO2 of 45 mmHg.  Bicarbonate was 5.3 and acid-base deficit 22.8 mmol/L.  Beta-hydroxybutyrate Cacit was greater than 8.00 mmol/L.  Lipase was normal.  CMP showed a 728, potassium 6.1, chloride 94 and CO2 less than 7 mmol/L with an not calculated anion gap.  Total bilirubin 1.9 glucose 699, BUN 28 and creatinine 1.78 mg/dL.  AST 136 and alkaline phosphatase 84 units/L.  The rest of the LFTs were normal.   Imaging: Portable 1 view chest radiograph with low lung volume.  No active disease.   ED course: Initial vital signs were temperature 97.5, pulse 90, respiration 18, BP 123/50 mmHg and O2 sat 99% on room air.  The patient received 2000 mL of LR bolus and was started on an insulin infusion.  **Interim History She continues to feel weak and her gap is not closed yet.  Will transition to long-acting once gap is closed.  Will obtain PT OT to further evaluate and treat given her generalized weakness and poor p.o. intake will obtain a nutritionist evaluation.  Assessment and Plan:  LADA (latent autoimmune diabetes in  adults), managed as type 1 (HCC) presenting as  DKA (diabetic ketoacidosis) (HCC) With associated Pseudohyponatremia -Observation/stepdown change to Inpatient  -Keep NPO for now until Gap closes. -May have ice chips and sips of water. -Continue IV fluids. -Continue insulin infusion. -Monitor CBG closely. CBG Trend: Recent Labs  Lab 08/01/23 1142 08/01/23 1248 08/01/23 1357 08/01/23 1546 08/01/23 1651 08/01/23 1801 08/01/23 1852  GLUCAP 201* 231* 188* 139* 177* 181* 152*  -C/w BMP every 4 hours and BHA every 8 hours. -Her beta hydroxybutyrate acid went from >8.00 -> 4.31 -> 1.19 -> 0.86 -Replace electrolytes as needed. -Consulted diabetes coordinator. -Transition to SQ insulin per Endo tool once gap is closed and CO2 greater than 20 x 2. -WBC Trend: Recent Labs  Lab 07/12/23 2039 07/31/23 0930 08/01/23 0152 08/01/23 1143  WBC 7.7 11.3* 12.8* 9.4  -Will transition to on Semglee 11 units daily and continue insulin drip for 2 hours after Semglee is given and then DC the insulin drip.  Will also add a NovoLog sensitive sliding scale before meals and at bedtime and start meal coverage if she is allowed to eat with 4 units of NovoLog 3 times daily with meals and hold for n.p.o. and hold if she eats less than 50% of her meals   PAF (paroxysmal atrial fibrillation) (HCC) -Continue amiodarone 100 mg p.o. daily. -Continue apixaban 2.5 mg p.o. twice daily. -BP soft so hold Antihypertensives -Continue to Monitor on Telemeetry   AKI on Stage 3b  PROGRESS NOTE    [name]  UVO:[id number] DOB: [birth year] DOA: 07/31/2023 PCP: [name]   Brief Narrative:  HPI per [name]   [name] is a 88 y.o. female with medical history significant of anemia, arthritis, colon polyps, heart murmur, hyperlipidemia, hypertension, hypothyroidism, history of pneumonia, type 1 diabetes who presented emergency department complaints of hyperglycemia for the past 2 days associated with weakness and an episode of emesis in triage.  The patient was not vomiting at home.  She is oriented to name, she knows she is in the hospital but thought she was at [facility] and is disoriented to time/day.  She is able to answer simple questions.  No headaches, dyspnea, chest, back or abdominal pain at the moment.   Lab work: CBC 0 white count 11.3, hemoglobin 11.0 g deciliter platelets 247.  Venous blood gas with a pH of 7.08, pCO2 18 and pO2 of 45 mmHg.  Bicarbonate was 5.3 and acid-base deficit 22.8 mmol/L.  Beta-hydroxybutyrate Cacit was greater than 8.00 mmol/L.  Lipase was normal.  CMP showed a 728, potassium 6.1, chloride 94 and CO2 less than 7 mmol/L with an not calculated anion gap.  Total bilirubin 1.9 glucose 699, BUN 28 and creatinine 1.78 mg/dL.  AST 136 and alkaline phosphatase 84 units/L.  The rest of the LFTs were normal.   Imaging: Portable 1 view chest radiograph with low lung volume.  No active disease.   ED course: Initial vital signs were temperature 97.5, pulse 90, respiration 18, BP 123/50 mmHg and O2 sat 99% on room air.  The patient received 2000 mL of LR bolus and was started on an insulin infusion.  **Interim History She continues to feel weak and her gap is not closed yet.  Will transition to long-acting once gap is closed.  Will obtain PT OT to further evaluate and treat given her generalized weakness and poor p.o. intake will obtain a nutritionist evaluation.  Assessment and Plan:  LADA (latent autoimmune diabetes in  adults), managed as type 1 (HCC) presenting as  DKA (diabetic ketoacidosis) (HCC) With associated Pseudohyponatremia -Observation/stepdown change to Inpatient  -Keep NPO for now until Gap closes. -May have ice chips and sips of water. -Continue IV fluids. -Continue insulin infusion. -Monitor CBG closely. CBG Trend: Recent Labs  Lab 08/01/23 1142 08/01/23 1248 08/01/23 1357 08/01/23 1546 08/01/23 1651 08/01/23 1801 08/01/23 1852  GLUCAP 201* 231* 188* 139* 177* 181* 152*  -C/w BMP every 4 hours and BHA every 8 hours. -Her beta hydroxybutyrate acid went from >8.00 -> 4.31 -> 1.19 -> 0.86 -Replace electrolytes as needed. -Consulted diabetes coordinator. -Transition to SQ insulin per Endo tool once gap is closed and CO2 greater than 20 x 2. -WBC Trend: Recent Labs  Lab 07/12/23 2039 07/31/23 0930 08/01/23 0152 08/01/23 1143  WBC 7.7 11.3* 12.8* 9.4  -Will transition to on Semglee 11 units daily and continue insulin drip for 2 hours after Semglee is given and then DC the insulin drip.  Will also add a NovoLog sensitive sliding scale before meals and at bedtime and start meal coverage if she is allowed to eat with 4 units of NovoLog 3 times daily with meals and hold for n.p.o. and hold if she eats less than 50% of her meals   PAF (paroxysmal atrial fibrillation) (HCC) -Continue amiodarone 100 mg p.o. daily. -Continue apixaban 2.5 mg p.o. twice daily. -BP soft so hold Antihypertensives -Continue to Monitor on Telemeetry   AKI on Stage 3b  Profile: No results for input(s): "INR", "PROTIME" in the last 168 hours. Cardiac Enzymes: No results for input(s): "CKTOTAL", "CKMB", "CKMBINDEX", "TROPONINI" in the last 168 hours. BNP (last 3 results) No results for input(s): "PROBNP" in the last 8760 hours. HbA1C: No results for input(s): "HGBA1C" in the last 72 hours. CBG: Recent Labs  Lab 08/01/23 1357 08/01/23 1546 08/01/23 1651 08/01/23 1801 08/01/23 1852  GLUCAP 188* 139* 177* 181* 152*   Lipid Profile: No results for input(s): "CHOL", "HDL", "LDLCALC", "TRIG", "CHOLHDL", "LDLDIRECT" in the last 72 hours. Thyroid Function Tests: No results for input(s): "TSH", "T4TOTAL", "FREET4", "T3FREE", "THYROIDAB" in the last 72 hours. Anemia Panel: No results for input(s): "VITAMINB12", "FOLATE", "FERRITIN", "TIBC", "IRON", "RETICCTPCT" in the last 72 hours. Sepsis Labs: No results for input(s): "PROCALCITON", "LATICACIDVEN" in the last 168 hours.  Recent Results (from the past 240 hour(s))  MRSA Next Gen by PCR, Nasal     Status: None   Collection Time: 07/31/23  1:03 PM   Specimen: Nasal Mucosa; Nasal Swab  Result Value Ref Range Status   MRSA by PCR Next Gen NOT DETECTED NOT DETECTED Final    Comment: (NOTE) The GeneXpert MRSA Assay (FDA approved for NASAL specimens only), is one component of a comprehensive MRSA colonization surveillance program. It is not intended to diagnose MRSA infection nor to guide or monitor treatment for MRSA infections. Test performance is not FDA approved in patients less than 47 years old. Performed at [facility], [address], [place], [place] [address]     Radiology Studies: VAS [place] UPPER EXTREMITY VENOUS DUPLEX  Result Date: 08/01/2023 UPPER VENOUS STUDY  Patient Name:  CRISTELA NEALLY  Date of Exam:   08/01/2023 Medical Rec #: [id number]     Accession #:    [id number] Date of Birth: 05/28/1935     Patient Gender: F Patient Age:   63 years Exam Location:  [facility] Procedure:      VAS [place] UPPER EXTREMITY VENOUS DUPLEX Referring Phys: Marguerita Merles --------------------------------------------------------------------------------  Indications: Swelling Risk Factors: Immobility past pregnancy. Comparison Study: No prior study Performing Technologist: Shona Simpson  Examination Guidelines: A complete evaluation includes B-mode imaging, spectral Doppler, color Doppler, and power Doppler as needed of all accessible portions of each vessel. Bilateral testing is considered an integral part of a complete examination. Limited examinations for reoccurring indications may be performed as noted.  Right Findings: +----------+------------+---------+-----------+----------+-------+ RIGHT     CompressiblePhasicitySpontaneousPropertiesSummary +----------+------------+---------+-----------+----------+-------+ IJV           Full       Yes       Yes                      +----------+------------+---------+-----------+----------+-------+ Subclavian    Full       Yes       Yes                      +----------+------------+---------+-----------+----------+-------+ Axillary      Full       Yes       Yes                      +----------+------------+---------+-----------+----------+-------+ Brachial      Full       Yes       Yes                      +----------+------------+---------+-----------+----------+-------+ Radial  Profile: No results for input(s): "INR", "PROTIME" in the last 168 hours. Cardiac Enzymes: No results for input(s): "CKTOTAL", "CKMB", "CKMBINDEX", "TROPONINI" in the last 168 hours. BNP (last 3 results) No results for input(s): "PROBNP" in the last 8760 hours. HbA1C: No results for input(s): "HGBA1C" in the last 72 hours. CBG: Recent Labs  Lab 08/01/23 1357 08/01/23 1546 08/01/23 1651 08/01/23 1801 08/01/23 1852  GLUCAP 188* 139* 177* 181* 152*   Lipid Profile: No results for input(s): "CHOL", "HDL", "LDLCALC", "TRIG", "CHOLHDL", "LDLDIRECT" in the last 72 hours. Thyroid Function Tests: No results for input(s): "TSH", "T4TOTAL", "FREET4", "T3FREE", "THYROIDAB" in the last 72 hours. Anemia Panel: No results for input(s): "VITAMINB12", "FOLATE", "FERRITIN", "TIBC", "IRON", "RETICCTPCT" in the last 72 hours. Sepsis Labs: No results for input(s): "PROCALCITON", "LATICACIDVEN" in the last 168 hours.  Recent Results (from the past 240 hour(s))  MRSA Next Gen by PCR, Nasal     Status: None   Collection Time: 07/31/23  1:03 PM   Specimen: Nasal Mucosa; Nasal Swab  Result Value Ref Range Status   MRSA by PCR Next Gen NOT DETECTED NOT DETECTED Final    Comment: (NOTE) The GeneXpert MRSA Assay (FDA approved for NASAL specimens only), is one component of a comprehensive MRSA colonization surveillance program. It is not intended to diagnose MRSA infection nor to guide or monitor treatment for MRSA infections. Test performance is not FDA approved in patients less than 47 years old. Performed at [facility], [address], [place], [place] [address]     Radiology Studies: VAS [place] UPPER EXTREMITY VENOUS DUPLEX  Result Date: 08/01/2023 UPPER VENOUS STUDY  Patient Name:  CRISTELA NEALLY  Date of Exam:   08/01/2023 Medical Rec #: [id number]     Accession #:    [id number] Date of Birth: 05/28/1935     Patient Gender: F Patient Age:   63 years Exam Location:  [facility] Procedure:      VAS [place] UPPER EXTREMITY VENOUS DUPLEX Referring Phys: Marguerita Merles --------------------------------------------------------------------------------  Indications: Swelling Risk Factors: Immobility past pregnancy. Comparison Study: No prior study Performing Technologist: Shona Simpson  Examination Guidelines: A complete evaluation includes B-mode imaging, spectral Doppler, color Doppler, and power Doppler as needed of all accessible portions of each vessel. Bilateral testing is considered an integral part of a complete examination. Limited examinations for reoccurring indications may be performed as noted.  Right Findings: +----------+------------+---------+-----------+----------+-------+ RIGHT     CompressiblePhasicitySpontaneousPropertiesSummary +----------+------------+---------+-----------+----------+-------+ IJV           Full       Yes       Yes                      +----------+------------+---------+-----------+----------+-------+ Subclavian    Full       Yes       Yes                      +----------+------------+---------+-----------+----------+-------+ Axillary      Full       Yes       Yes                      +----------+------------+---------+-----------+----------+-------+ Brachial      Full       Yes       Yes                      +----------+------------+---------+-----------+----------+-------+ Radial  Profile: No results for input(s): "INR", "PROTIME" in the last 168 hours. Cardiac Enzymes: No results for input(s): "CKTOTAL", "CKMB", "CKMBINDEX", "TROPONINI" in the last 168 hours. BNP (last 3 results) No results for input(s): "PROBNP" in the last 8760 hours. HbA1C: No results for input(s): "HGBA1C" in the last 72 hours. CBG: Recent Labs  Lab 08/01/23 1357 08/01/23 1546 08/01/23 1651 08/01/23 1801 08/01/23 1852  GLUCAP 188* 139* 177* 181* 152*   Lipid Profile: No results for input(s): "CHOL", "HDL", "LDLCALC", "TRIG", "CHOLHDL", "LDLDIRECT" in the last 72 hours. Thyroid Function Tests: No results for input(s): "TSH", "T4TOTAL", "FREET4", "T3FREE", "THYROIDAB" in the last 72 hours. Anemia Panel: No results for input(s): "VITAMINB12", "FOLATE", "FERRITIN", "TIBC", "IRON", "RETICCTPCT" in the last 72 hours. Sepsis Labs: No results for input(s): "PROCALCITON", "LATICACIDVEN" in the last 168 hours.  Recent Results (from the past 240 hour(s))  MRSA Next Gen by PCR, Nasal     Status: None   Collection Time: 07/31/23  1:03 PM   Specimen: Nasal Mucosa; Nasal Swab  Result Value Ref Range Status   MRSA by PCR Next Gen NOT DETECTED NOT DETECTED Final    Comment: (NOTE) The GeneXpert MRSA Assay (FDA approved for NASAL specimens only), is one component of a comprehensive MRSA colonization surveillance program. It is not intended to diagnose MRSA infection nor to guide or monitor treatment for MRSA infections. Test performance is not FDA approved in patients less than 47 years old. Performed at [facility], [address], [place], [place] [address]     Radiology Studies: VAS [place] UPPER EXTREMITY VENOUS DUPLEX  Result Date: 08/01/2023 UPPER VENOUS STUDY  Patient Name:  CRISTELA NEALLY  Date of Exam:   08/01/2023 Medical Rec #: [id number]     Accession #:    [id number] Date of Birth: 05/28/1935     Patient Gender: F Patient Age:   63 years Exam Location:  [facility] Procedure:      VAS [place] UPPER EXTREMITY VENOUS DUPLEX Referring Phys: Marguerita Merles --------------------------------------------------------------------------------  Indications: Swelling Risk Factors: Immobility past pregnancy. Comparison Study: No prior study Performing Technologist: Shona Simpson  Examination Guidelines: A complete evaluation includes B-mode imaging, spectral Doppler, color Doppler, and power Doppler as needed of all accessible portions of each vessel. Bilateral testing is considered an integral part of a complete examination. Limited examinations for reoccurring indications may be performed as noted.  Right Findings: +----------+------------+---------+-----------+----------+-------+ RIGHT     CompressiblePhasicitySpontaneousPropertiesSummary +----------+------------+---------+-----------+----------+-------+ IJV           Full       Yes       Yes                      +----------+------------+---------+-----------+----------+-------+ Subclavian    Full       Yes       Yes                      +----------+------------+---------+-----------+----------+-------+ Axillary      Full       Yes       Yes                      +----------+------------+---------+-----------+----------+-------+ Brachial      Full       Yes       Yes                      +----------+------------+---------+-----------+----------+-------+ Radial  Profile: No results for input(s): "INR", "PROTIME" in the last 168 hours. Cardiac Enzymes: No results for input(s): "CKTOTAL", "CKMB", "CKMBINDEX", "TROPONINI" in the last 168 hours. BNP (last 3 results) No results for input(s): "PROBNP" in the last 8760 hours. HbA1C: No results for input(s): "HGBA1C" in the last 72 hours. CBG: Recent Labs  Lab 08/01/23 1357 08/01/23 1546 08/01/23 1651 08/01/23 1801 08/01/23 1852  GLUCAP 188* 139* 177* 181* 152*   Lipid Profile: No results for input(s): "CHOL", "HDL", "LDLCALC", "TRIG", "CHOLHDL", "LDLDIRECT" in the last 72 hours. Thyroid Function Tests: No results for input(s): "TSH", "T4TOTAL", "FREET4", "T3FREE", "THYROIDAB" in the last 72 hours. Anemia Panel: No results for input(s): "VITAMINB12", "FOLATE", "FERRITIN", "TIBC", "IRON", "RETICCTPCT" in the last 72 hours. Sepsis Labs: No results for input(s): "PROCALCITON", "LATICACIDVEN" in the last 168 hours.  Recent Results (from the past 240 hour(s))  MRSA Next Gen by PCR, Nasal     Status: None   Collection Time: 07/31/23  1:03 PM   Specimen: Nasal Mucosa; Nasal Swab  Result Value Ref Range Status   MRSA by PCR Next Gen NOT DETECTED NOT DETECTED Final    Comment: (NOTE) The GeneXpert MRSA Assay (FDA approved for NASAL specimens only), is one component of a comprehensive MRSA colonization surveillance program. It is not intended to diagnose MRSA infection nor to guide or monitor treatment for MRSA infections. Test performance is not FDA approved in patients less than 47 years old. Performed at [facility], [address], [place], [place] [address]     Radiology Studies: VAS [place] UPPER EXTREMITY VENOUS DUPLEX  Result Date: 08/01/2023 UPPER VENOUS STUDY  Patient Name:  CRISTELA NEALLY  Date of Exam:   08/01/2023 Medical Rec #: [id number]     Accession #:    [id number] Date of Birth: 05/28/1935     Patient Gender: F Patient Age:   63 years Exam Location:  [facility] Procedure:      VAS [place] UPPER EXTREMITY VENOUS DUPLEX Referring Phys: Marguerita Merles --------------------------------------------------------------------------------  Indications: Swelling Risk Factors: Immobility past pregnancy. Comparison Study: No prior study Performing Technologist: Shona Simpson  Examination Guidelines: A complete evaluation includes B-mode imaging, spectral Doppler, color Doppler, and power Doppler as needed of all accessible portions of each vessel. Bilateral testing is considered an integral part of a complete examination. Limited examinations for reoccurring indications may be performed as noted.  Right Findings: +----------+------------+---------+-----------+----------+-------+ RIGHT     CompressiblePhasicitySpontaneousPropertiesSummary +----------+------------+---------+-----------+----------+-------+ IJV           Full       Yes       Yes                      +----------+------------+---------+-----------+----------+-------+ Subclavian    Full       Yes       Yes                      +----------+------------+---------+-----------+----------+-------+ Axillary      Full       Yes       Yes                      +----------+------------+---------+-----------+----------+-------+ Brachial      Full       Yes       Yes                      +----------+------------+---------+-----------+----------+-------+ Radial

## 2023-08-01 NOTE — Inpatient Diabetes Management (Addendum)
Inpatient Diabetes Program Recommendations  AACE/ADA: New Consensus Statement on Inpatient Glycemic Control (2015)  Target Ranges:  Prepandial:   less than 140 mg/dL      Peak postprandial:   less than 180 mg/dL (1-2 hours)      Critically ill patients:  140 - 180 mg/dL    Latest Reference Range & Units 11/16/22 14:56 03/22/23 14:46  Hemoglobin A1C 4.8 - 5.6 % 10.5 (H) 11.5 (H)  283 mg/dl  (H): Data is abnormally high  Latest Reference Range & Units 07/31/23 09:30  Sodium 135 - 145 mmol/L 128 (L)  Potassium 3.5 - 5.1 mmol/L 6.1 (H)  Chloride 98 - 111 mmol/L 94 (L)  CO2 22 - 32 mmol/L <7 (L)  Glucose 70 - 99 mg/dL 034 (HH)  BUN 8 - 23 mg/dL 28 (H)  Creatinine 7.42 - 1.00 mg/dL 5.95 (H)  Calcium 8.9 - 10.3 mg/dL 9.3  Anion gap 5 - 15  NOT CALCULATED    Latest Reference Range & Units 07/31/23 07:59 07/31/23 08:25 07/31/23 10:11 07/31/23 10:53 07/31/23 11:50 07/31/23 13:13 07/31/23 13:49 07/31/23 14:26  Glucose-Capillary 70 - 99 mg/dL 638 (HH) >756 (HH) >433 (HH) >600 (HH)  IV Insulin Drip Started >600 (HH) 576 (HH) 535 (HH) 452 (H)  (HH): Data is critically high (H): Data is abnormally high  Latest Reference Range & Units 07/31/23 14:58 07/31/23 15:40 07/31/23 17:48 07/31/23 18:53 07/31/23 20:16 07/31/23 21:20 07/31/23 22:19 07/31/23 23:18  Glucose-Capillary 70 - 99 mg/dL 295 (H) 188 (H) 416 (H) 180 (H) 176 (H) 189 (H) 155 (H) 149 (H)  (H): Data is abnormally high  Latest Reference Range & Units 08/01/23 00:21 08/01/23 01:21 08/01/23 02:27 08/01/23 03:44 08/01/23 04:48 08/01/23 05:43 08/01/23 06:44  Glucose-Capillary 70 - 99 mg/dL 606 (H) 301 (H) 601 (H) 125 (H) 133 (H) 144 (H) 146 (H)  IV Insulin Drip Running  (H): Data is abnormally high    Admit with: DKA  History: LADA--Managed as Type 1 diabetes  Home DM Meds: Trulicity 0.75 mg Qweek        Novolog 2-8 units TID        Toujeo 11 units QHS  Current Orders: IV Insulin Drip   ENDO: Dr. Ledon Snare with [facility]  Seen 06/03/2023 Pt was told to take the following: - Continue Toujeo 12 units daily - Continue novolog 11 units TIDAC and correctional scale (2:50>150)  - Increase Trulicity to 3 mg weekly - I have discussed with the patient the blood sugars continue to be monitored via Freestyle Libre 2    MD- Note 2am BMET shows the following: Glucose 172/ Anion Gap 8/ CO2 level 21  When you allow pt to transition to SQ Insulin, please consider:  1. Start Semglee 11 units Daily (home dose basal insulin) Please continue IV Insulin Drip for 2 hours after Semglee given and then can d/c IV Insulin Drip  2. Start Novolog Sensitive Correction Scale/ SSI (0-9 units) TID AC + HS  3. Start Novolog Meal Coverage if allowed to eat PO diet: Novolog 4 units TID with meals  HOLD if pt NPO HOLD if pt eats <50% meals    Addendum 12pm: Met w/ pt and her daughter at bedside.  Pt's daughter told me pt is still a little confused but she is able to answer my questions.  Dtr present for conversation.  Pt told me she has had Diabetes for 10 years and is a Type 1 diabetic.  Takes insulin at home.  Pt  was mildly confused about her insulins (could tell me the names but confused her long-acting for the rapid-acting)--Dtr confirmed pt taking Toujeo 11 units at bedtime + Novolog with meals.  Pt confirmed she takes set dose for food coverage and SSI as well.  Pt told me she still drives and works a few hours per week.  Lives independently and takes her insulin herself but daughter very involved in her care.  Has Freestyle Libre 2 CGM but told me it "fell off" in the ED.  Uses a reader to scan for glucose levels.  Does not have any more sensors at home and needs to get refill from [facility].  I suggested to pt and Dtr that they call the [facility] [place] company to get free replacement sensor since the sensor was likely removed in the hospital.  Dtr had concerns about pt taking the Trulicity at home.  Dtr told me the ENDO increased the dose to 3  mg once weekly but that Dtr called them and asked to reduce the dose back to 0.75 mg Qweek.  Dtr concerned that pt taking Remeron for appetite stimulation (rxd by PCP) and the Trulicity (rxd by ENDO) which has appetite reduction effects.  Dtr asked me to call ENDO to get clarification on whether pt should take the Trulicity.  Call placed to pt's ENDO with [facility] to inquire about this--Awaiting call back.     --Will follow patient during hospitalization--  Ambrose Finland RN, MSN, CDCES Diabetes Coordinator Inpatient Glycemic Control Team Team Pager: [phone] (8a-5p)

## 2023-08-01 NOTE — Progress Notes (Signed)
Upper extremity venous duplex completed. Please see CV Procedures for preliminary results.  Shona Simpson, RVT 08/01/23 1:33 PM

## 2023-08-02 DIAGNOSIS — E440 Moderate protein-calorie malnutrition: Secondary | ICD-10-CM | POA: Insufficient documentation

## 2023-08-02 DIAGNOSIS — N1832 Chronic kidney disease, stage 3b: Secondary | ICD-10-CM | POA: Diagnosis not present

## 2023-08-02 DIAGNOSIS — E1010 Type 1 diabetes mellitus with ketoacidosis without coma: Secondary | ICD-10-CM

## 2023-08-02 DIAGNOSIS — Z87440 Personal history of urinary (tract) infections: Secondary | ICD-10-CM | POA: Diagnosis not present

## 2023-08-02 DIAGNOSIS — R319 Hematuria, unspecified: Secondary | ICD-10-CM

## 2023-08-02 DIAGNOSIS — I480 Paroxysmal atrial fibrillation: Secondary | ICD-10-CM | POA: Diagnosis not present

## 2023-08-02 LAB — GLUCOSE, CAPILLARY
Glucose-Capillary: 130 mg/dL — ABNORMAL HIGH (ref 70–99)
Glucose-Capillary: 133 mg/dL — ABNORMAL HIGH (ref 70–99)
Glucose-Capillary: 201 mg/dL — ABNORMAL HIGH (ref 70–99)
Glucose-Capillary: 244 mg/dL — ABNORMAL HIGH (ref 70–99)
Glucose-Capillary: 58 mg/dL — ABNORMAL LOW (ref 70–99)
Glucose-Capillary: 61 mg/dL — ABNORMAL LOW (ref 70–99)
Glucose-Capillary: 89 mg/dL (ref 70–99)
Glucose-Capillary: 93 mg/dL (ref 70–99)

## 2023-08-02 LAB — CBC WITH DIFFERENTIAL/PLATELET
Abs Immature Granulocytes: 0.02 10*3/uL (ref 0.00–0.07)
Basophils Absolute: 0.1 10*3/uL (ref 0.0–0.1)
Basophils Relative: 1 %
Eosinophils Absolute: 0.2 10*3/uL (ref 0.0–0.5)
Eosinophils Relative: 3 %
HCT: 31.8 % — ABNORMAL LOW (ref 36.0–46.0)
Hemoglobin: 10.1 g/dL — ABNORMAL LOW (ref 12.0–15.0)
Immature Granulocytes: 0 %
Lymphocytes Relative: 22 %
Lymphs Abs: 1.4 10*3/uL (ref 0.7–4.0)
MCH: 29.2 pg (ref 26.0–34.0)
MCHC: 31.8 g/dL (ref 30.0–36.0)
MCV: 91.9 fL (ref 80.0–100.0)
Monocytes Absolute: 0.6 10*3/uL (ref 0.1–1.0)
Monocytes Relative: 9 %
Neutro Abs: 4 10*3/uL (ref 1.7–7.7)
Neutrophils Relative %: 65 %
Platelets: 171 10*3/uL (ref 150–400)
RBC: 3.46 MIL/uL — ABNORMAL LOW (ref 3.87–5.11)
RDW: 15.2 % (ref 11.5–15.5)
WBC: 6.3 10*3/uL (ref 4.0–10.5)
nRBC: 0 % (ref 0.0–0.2)

## 2023-08-02 LAB — COMPREHENSIVE METABOLIC PANEL
ALT: 150 U/L — ABNORMAL HIGH (ref 0–44)
AST: 173 U/L — ABNORMAL HIGH (ref 15–41)
Albumin: 2.4 g/dL — ABNORMAL LOW (ref 3.5–5.0)
Alkaline Phosphatase: 84 U/L (ref 38–126)
Anion gap: 7 (ref 5–15)
BUN: 13 mg/dL (ref 8–23)
CO2: 18 mmol/L — ABNORMAL LOW (ref 22–32)
Calcium: 8 mg/dL — ABNORMAL LOW (ref 8.9–10.3)
Chloride: 110 mmol/L (ref 98–111)
Creatinine, Ser: 0.91 mg/dL (ref 0.44–1.00)
GFR, Estimated: >60 mL/min (ref >60)
Glucose, Bld: 92 mg/dL (ref 70–99)
Potassium: 3.3 mmol/L — ABNORMAL LOW (ref 3.5–5.1)
Sodium: 135 mmol/L (ref 135–145)
Total Bilirubin: 0.5 mg/dL (ref 0.3–1.2)
Total Protein: 5.5 g/dL — ABNORMAL LOW (ref 6.5–8.1)

## 2023-08-02 LAB — URINE CULTURE: Culture: <10000 — AB

## 2023-08-02 LAB — PHOSPHORUS: Phosphorus: 3 mg/dL (ref 2.5–4.6)

## 2023-08-02 LAB — MAGNESIUM: Magnesium: 1.9 mg/dL (ref 1.7–2.4)

## 2023-08-02 MED ORDER — GLUCERNA SHAKE PO LIQD
237.0000 mL | Freq: Two times a day (BID) | ORAL | Status: DC
  Administered 2023-08-02 – 2023-08-04 (×4): 237 mL via ORAL
  Filled 2023-08-02 (×5): qty 237

## 2023-08-02 MED ORDER — SODIUM BICARBONATE 650 MG PO TABS
650.0000 mg | ORAL_TABLET | Freq: Two times a day (BID) | ORAL | Status: DC
  Administered 2023-08-02 – 2023-08-04 (×5): 650 mg via ORAL
  Filled 2023-08-02 (×5): qty 1

## 2023-08-02 MED ORDER — ADULT MULTIVITAMIN W/MINERALS CH
1.0000 | ORAL_TABLET | Freq: Every day | ORAL | Status: DC
  Administered 2023-08-03 – 2023-08-04 (×2): 1 via ORAL
  Filled 2023-08-02 (×2): qty 1

## 2023-08-02 MED ORDER — INSULIN ASPART 100 UNIT/ML IJ SOLN
0.0000 [IU] | Freq: Three times a day (TID) | INTRAMUSCULAR | Status: DC
  Administered 2023-08-02: 2 [IU] via SUBCUTANEOUS

## 2023-08-02 MED ORDER — SODIUM CHLORIDE 0.9 % IV SOLN
1.0000 g | INTRAVENOUS | Status: DC
  Administered 2023-08-02 – 2023-08-03 (×2): 1 g via INTRAVENOUS
  Filled 2023-08-02 (×3): qty 10

## 2023-08-02 MED ORDER — SODIUM CHLORIDE 0.9 % IV BOLUS
500.0000 mL | Freq: Once | INTRAVENOUS | Status: AC
  Administered 2023-08-02: 500 mL via INTRAVENOUS

## 2023-08-02 MED ORDER — POTASSIUM CHLORIDE CRYS ER 20 MEQ PO TBCR: 20.0000 meq | EXTENDED_RELEASE_TABLET | Freq: Every day | ORAL | Status: DC

## 2023-08-02 MED ORDER — POTASSIUM CHLORIDE CRYS ER 20 MEQ PO TBCR
40.0000 meq | EXTENDED_RELEASE_TABLET | ORAL | Status: AC
  Administered 2023-08-02 (×2): 40 meq via ORAL
  Filled 2023-08-02 (×2): qty 2

## 2023-08-02 NOTE — Evaluation (Signed)
Physical Therapy Evaluation Patient Details Name: [name] MRN: [id number] DOB: [birth year] Today's Date: 08/02/2023  History of Present Illness  Patient is a 88 year old female who presented on 10/20 with weakness and disorientation. 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 WUJ:WJXBJY, arthritis, colon polyps, heart murmur, hyperlipidemia, hypertension, hypothyroidism, history of pneumonia, type 1 diabetes  Clinical Impression  Pt admitted with above diagnosis.   Pt currently with functional limitations due to the deficits listed below (see PT Problem List). Pt will benefit from acute skilled PT to increase their independence and safety with mobility to allow discharge.       The patient reports independent and working PTA and has goal to return home,reporting has family support. The patient  does present with weakness and required  assistance to steady stand and transfer to recliner.  Recommend HHPT if goal is to return home. HR 97-115 BP 117/99 post mobility.    If plan is discharge home, recommend the following: A little help with walking and/or transfers;A little help with bathing/dressing/bathroom;Assistance with cooking/housework;Direct supervision/assist for medications management;Assist for transportation;Help with stairs or ramp for entrance   Can travel by private vehicle        Equipment Recommendations None recommended by PT  Recommendations for Other Services       Functional Status Assessment Patient has had a recent decline in their functional status and demonstrates the ability to make significant improvements in function in a reasonable and predictable amount of time.     Precautions / Restrictions Precautions Precautions: Fall Restrictions Weight Bearing Restrictions: No      Mobility  Bed Mobility   Bed Mobility: Supine to Sit     Supine to sit:  Min assist     General bed mobility comments: min support to move to sitting    Transfers Overall transfer level: Needs assistance Equipment used: 2 person hand held assist Transfers: Sit to/from Stand Sit to Stand: +2 safety/equipment, Mod assist           General transfer comment: mod support to stand   and pivot step to recliner    Ambulation/Gait                  Stairs            Wheelchair Mobility     Tilt Bed    Modified Rankin (Stroke Patients Only)       Balance Overall balance assessment: Needs assistance Sitting-balance support: Bilateral upper extremity supported, Feet supported Sitting balance-Leahy Scale: Fair     Standing balance support: During functional activity, Bilateral upper extremity supported   Standing balance comment: reliant on support to stand and  pivot to recliner                             Pertinent Vitals/Pain      Home Living Family/patient expects to be discharged to:: Private residence Living Arrangements: Alone Available Help at Discharge: Family;Available PRN/intermittently Type of Home: House Home Access: Stairs to enter Entrance Stairs-Rails: None Entrance Stairs-Number of Steps: 1-2     Home Equipment: Agricultural consultant (2 wheels);Cane - single point      Prior Function Prior Level of Function : Independent/Modified Independent;Driving;Working/employed             Mobility Comments: still drives and works in housekeeping ADLs Comments: 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     Extremity/Trunk Assessment   Upper Extremity Assessment Upper Extremity Assessment: Overall WFL for tasks assessed;Right hand dominant    Lower Extremity Assessment Lower Extremity Assessment: Generalized weakness    Cervical / Trunk Assessment Cervical / Trunk Assessment: Normal  Communication   Communication Communication: No apparent difficulties  Cognition Arousal:  Alert Behavior During Therapy: WFL for tasks assessed/performed Overall Cognitive Status: Within Functional Limits for tasks assessed                                 General Comments: patient was alittle HOH during session. patient's granddaughter was present during session.        General Comments      Exercises     Assessment/Plan    PT Assessment Patient needs continued PT services  PT Problem List Decreased strength;Decreased cognition;Decreased mobility;Decreased activity tolerance;Decreased safety awareness;Cardiopulmonary status limiting activity       PT Treatment Interventions DME instruction;Therapeutic activities;Patient/family education;Functional mobility training    PT Goals (Current goals can be found in the Care Plan section)  Acute Rehab PT Goals Patient Stated Goal: to go home PT Goal Formulation: With patient/family Time For Goal Achievement: 08/16/23 Potential to Achieve Goals: Good    Frequency Min 1X/week     Co-evaluation PT/OT/SLP Co-Evaluation/Treatment: Yes Reason for Co-Treatment: To address functional/ADL transfers PT goals addressed during session: Mobility/safety with mobility OT goals addressed during session: ADL's and self-care       AM-PAC PT "6 Clicks" Mobility  Outcome Measure Help needed turning from your back to your side while in a flat bed without using bedrails?: A Little Help needed moving from lying on your back to sitting on the side of a flat bed without using bedrails?: A Little Help needed moving to and from a bed to a chair (including a wheelchair)?: A Little Help needed standing up from a chair using your arms (e.g., wheelchair or bedside chair)?: A Little Help needed to walk in hospital room?: Total Help needed climbing 3-5 steps with a railing? : Total 6 Click Score: 14    End of Session   Activity Tolerance: Patient tolerated treatment well Patient left: in chair;with call bell/phone within  reach;with family/visitor present;with chair alarm set Nurse Communication: Mobility status PT Visit Diagnosis: Unsteadiness on feet (R26.81);Muscle weakness (generalized) (M62.81);Difficulty in walking, not elsewhere classified (R26.2)    Time: 5956-3875 PT Time Calculation (min) (ACUTE ONLY): 25 min   Charges:   PT Evaluation $PT Eval Low Complexity: 1 Low   PT General Charges $$ ACUTE PT VISIT: 1 Visit         Blanchard Kelch PT Acute Rehabilitation Services Office [phone] Weekend [phone]   Rada Hay 08/02/2023, 1:26 PM

## 2023-08-02 NOTE — Progress Notes (Signed)
Initial Nutrition Assessment  DOCUMENTATION CODES:   Non-severe (moderate) malnutrition in context of chronic illness  INTERVENTION:  - Carb Modified diet per MD.  - Glucerna Shake po BID, each supplement provides 220 kcal and 10 grams of protein - Encourage intake at all meals and of supplements.  - Add Multivitamin with minerals daily - Monitor weight trends.    NUTRITION DIAGNOSIS:   Moderate Malnutrition related to chronic illness as evidenced by mild fat depletion, moderate muscle depletion, percent weight loss (12% weight loss in 1.5 months).  GOAL:   Patient will meet greater than or equal to 90% of their needs  MONITOR:   PO intake, Supplement acceptance, Weight trends  REASON FOR ASSESSMENT:   Consult Assessment of nutrition requirement/status, Poor PO  ASSESSMENT:   88 y.o. female with PMH significant of anemia, arthritis, colon polyps, heart murmur, HLD, HTN, hypothyroidism, history of PNA, type 1 diabetes who presented with complaints of hyperglycemia for the past 2 days associated with weakness. Admitted for DKA.  Patient reports she has been losing weight over the past ~2 months due to decreased appetite and eating less. Per EMR, weight stable since February until around August. From August to September patient had a 17# or 12% weight loss in 1.5 months, which is significant for the time frame.  Admit weight is the same as previous weight from 9/23 so question if this was copied over.   Patient endorses typically eating 3 meals a day at home. Over the past few weeks she has been eating 3 meals but admits they have been really small portions. Appetite has just been very poor.  She usually drinks 1-2 Glucerna a day at home but ran out recently and was too weak to get more for herself.  Thankfully, she reports her appetite is slowly coming back and she is documented to have had 75% of breakfast today. She is requesting Glucerna twice daily during admit. She has a  goal to gain back the weight she lost.    Medications reviewed and include: Insulin, Remeron  Labs reviewed:  + 3.3 HA1C 11.5 Blood Glucose 93-231   NUTRITION - FOCUSED PHYSICAL EXAM:  Flowsheet Row Most Recent Value  Orbital Region No depletion  Upper Arm Region No depletion  Thoracic and Lumbar Region Mild depletion  Buccal Region Mild depletion  Temple Region Moderate depletion  Clavicle Bone Region Moderate depletion  Clavicle and Acromion Bone Region Moderate depletion  Scapular Bone Region Unable to assess  Dorsal Hand Mild depletion  Patellar Region Mild depletion  Anterior Thigh Region Mild depletion  Posterior Calf Region No depletion  Edema (RD Assessment) None  Hair Reviewed  Eyes Reviewed  Mouth Reviewed  Skin Reviewed  Nails Reviewed       Diet Order:   Diet Order             Diet Carb Modified Fluid consistency: Thin; Room service appropriate? Yes  Diet effective now                   EDUCATION NEEDS:  Education needs have been addressed  Skin:  Skin Assessment: Skin Integrity Issues: Skin Integrity Issues:: Stage II Stage II: Mid Coccyx  Last BM:  10/22  Height:  Ht Readings from Last 1 Encounters:  07/31/23 5\' 2"  (1.575 m)   Weight:  Wt Readings from Last 1 Encounters:  07/31/23 54.9 kg    BMI:  Body mass index is 22.14 kg/m.  Estimated Nutritional Needs:  Kcal:  1550-1650 kcals Protein:  70-85 grams Fluid:  >/= 1.6L    Shelle Iron RD, LDN For contact information, refer to [facility].

## 2023-08-02 NOTE — Inpatient Diabetes Management (Signed)
Inpatient Diabetes Program Recommendations  AACE/ADA: New Consensus Statement on Inpatient Glycemic Control (2015)  Target Ranges:  Prepandial:   less than 140 mg/dL      Peak postprandial:   less than 180 mg/dL (1-2 hours)      Critically ill patients:  140 - 180 mg/dL   Lab Results  Component Value Date   GLUCAP 89 08/02/2023   HGBA1C 11.5 (H) 03/22/2023    Received Callback from  ENDO: Dr. Ledon Snare with [facility]: Plan is to stop the Trulicity for now. Nurse also called and spoke with patient's daughter.  Thank you, Billy Fischer. Orland Visconti, RN, MSN, CDCES  Diabetes Coordinator Inpatient Glycemic Control Team Team Pager [phone] (8am-5pm) 08/02/2023 12:53 PM

## 2023-08-02 NOTE — Patient Instructions (Addendum)
DUE TO COVID-19 ONLY TWO VISITORS  (aged 86 and older)  ARE ALLOWED TO COME WITH YOU AND STAY IN THE WAITING ROOM ONLY DURING PRE OP AND PROCEDURE.   **NO VISITORS ARE ALLOWED IN THE SHORT STAY AREA OR RECOVERY ROOM!!**  IF YOU WILL BE ADMITTED INTO THE HOSPITAL YOU ARE ALLOWED ONLY FOUR SUPPORT PEOPLE DURING VISITATION HOURS ONLY (7 AM -8PM)   The support person(s) must pass our screening, gel in and out, and wear a mask at all times, including in the patient's room. Patients must also wear a mask when staff or their support person are in the room. Visitors GUEST BADGE MUST BE WORN VISIBLY  One adult visitor may remain with you overnight and MUST be in the room by 8 P.M.     Your procedure is scheduled on: 08/12/23   [facility] Main [facility]    Report to admitting at : 11:00 AM   Call this number if you have problems the morning of surgery [phone]   Do not eat food :After Midnight.   After Midnight you may have the following liquids until : 10:00 AM  DAY OF SURGERY  Water Black Coffee (sugar ok, NO MILK/CREAM OR CREAMERS)  Tea (sugar ok, NO MILK/CREAM OR CREAMERS) regular and decaf                             Plain Jell-O (NO RED)                                           Fruit ices (not with fruit pulp, NO RED)                                     Popsicles (NO RED)                                                                  Juice: apple, WHITE grape, WHITE cranberry Sports drinks like Gatorade (NO RED)             FOLLOW ANY ADDITIONAL PRE OP INSTRUCTIONS YOU RECEIVED FROM YOUR SURGEON'S OFFICE!!!   Oral Hygiene is also important to reduce your risk of infection.                                    Remember - BRUSH YOUR TEETH THE MORNING OF SURGERY WITH YOUR REGULAR TOOTHPASTE  DENTURES WILL BE REMOVED PRIOR TO SURGERY PLEASE DO NOT APPLY "Poly grip" OR ADHESIVES!!!   Do NOT smoke after Midnight   Take these medicines the morning of surgery with A  SIP OF WATER: amiodaron,tiadylt ER,levothyroxine,protonix.Tylenol as needed.  How to Manage Your Diabetes Before and After Surgery  Why is it important to control my blood sugar before and after surgery? Improving blood sugar levels before and after surgery helps healing and can limit problems. A way of improving blood sugar control is eating a healthy diet by:  Eating less sugar and carbohydrates  Increasing activity/exercise  Talking with your doctor about reaching your blood sugar goals High blood sugars (greater than 180 mg/dL) can raise your risk of infections and slow your recovery, so you will need to focus on controlling your diabetes during the weeks before surgery. Make sure that the doctor who takes care of your diabetes knows about your planned surgery including the date and location.  How do I manage my blood sugar before surgery? Check your blood sugar at least 4 times a day, starting 2 days before surgery, to make sure that the level is not too high or low. Check your blood sugar the morning of your surgery when you wake up and every 2 hours until you get to the Short Stay unit. If your blood sugar is less than 70 mg/dL, you will need to treat for low blood sugar: Do not take insulin. Treat a low blood sugar (less than 70 mg/dL) with  cup of clear juice (cranberry or apple), 4 glucose tablets, OR glucose gel. Recheck blood sugar in 15 minutes after treatment (to make sure it is greater than 70 mg/dL). If your blood sugar is not greater than 70 mg/dL on recheck, call [phone] for further instructions. Report your blood sugar to the short stay nurse when you get to Short Stay.  If you are admitted to the hospital after surgery: Your blood sugar will be checked by the staff and you will probably be given insulin after surgery (instead of oral diabetes medicines) to make sure you have good blood sugar levels. The goal for blood sugar control after surgery is 80-180  mg/dL.   WHAT DO I DO ABOUT MY DIABETES MEDICATION? CHECK WITH ENDOCRINOLOGIST  THE NIGHT BEFORE SURGERY, take ONLY 80% of the toujeo insulin (8.8 units)      .     THE MORNING OF SURGERY,DO NOT TAKE ANY ORAL DIABETIC MEDICATIONS DAY OF YOUR SURGERY. If your CBG is greater than 220 mg/dL, you may take  of your sliding scale  (correction) dose of insulin.                     DO NOT TAKE THE FOLLOWING 7 DAYS PRIOR TO SURGERY: Ozempic, Wegovy, Rybelsus (Semaglutide), Byetta (exenatide), Bydureon (exenatide ER), Victoza, Saxenda (liraglutide), or Trulicity (dulaglutide) Mounjaro (Tirzepatide) Adlyxin (Lixisenatide), Polyethylene Glycol Loxenatide. HOLD trulicity after: 08/04/23             You may not have any metal on your body including hair pins, jewelry, and body piercing             Do not wear make-up, lotions, powders, perfumes/cologne, or deodorant  Do not wear nail polish including gel and S&S, artificial/acrylic nails, or any other type of covering on natural nails including finger and toenails. If you have artificial nails, gel coating, etc. that needs to be removed by a nail salon please have this removed prior to surgery or surgery may need to be canceled/ delayed if the surgeon/ anesthesia feels like they are unable to be safely monitored.   Do not shave  48 hours prior to surgery.               Men may shave face and neck.   Do not bring valuables to the hospital. [facility] IS NOT             RESPONSIBLE   FOR VALUABLES.   Contacts, glasses, or bridgework may not be  worn into surgery.   Bring small overnight bag day of surgery.   DO NOT BRING YOUR HOME MEDICATIONS TO THE HOSPITAL. PHARMACY WILL DISPENSE MEDICATIONS LISTED ON YOUR MEDICATION LIST TO YOU DURING YOUR ADMISSION IN THE HOSPITAL!    Patients discharged on the day of surgery will not be allowed to drive home.  Someone NEEDS to stay with you for the first 24 hours after anesthesia.   Special Instructions:  Bring a copy of your healthcare power of attorney and living will documents         the day of surgery if you haven't scanned them before.              Please read over the following fact sheets you were given: IF YOU HAVE QUESTIONS ABOUT YOUR PRE-OP INSTRUCTIONS PLEASE CALL [phone]    [facility] - Preparing for Surgery Before surgery, you can play an important role.  Because skin is not sterile, your skin needs to be as free of germs as possible.  You can reduce the number of germs on your skin by washing with CHG (chlorahexidine gluconate) soap before surgery.  CHG is an antiseptic cleaner which kills germs and bonds with the skin to continue killing germs even after washing. Please DO NOT use if you have an allergy to CHG or antibacterial soaps.  If your skin becomes reddened/irritated stop using the CHG and inform your nurse when you arrive at Short Stay. Do not shave (including legs and underarms) for at least 48 hours prior to the first CHG shower.  You may shave your face/neck. Please follow these instructions carefully:  1.  Shower with CHG Soap the night before surgery and the  morning of Surgery.  2.  If you choose to wash your hair, wash your hair first as usual with your  normal  shampoo.  3.  After you shampoo, rinse your hair and body thoroughly to remove the  shampoo.                           4.  Use CHG as you would any other liquid soap.  You can apply chg directly  to the skin and wash                       Gently with a scrungie or clean washcloth.  5.  Apply the CHG Soap to your body ONLY FROM THE NECK DOWN.   Do not use on face/ open                           Wound or open sores. Avoid contact with eyes, ears mouth and genitals (private parts).                       Wash face,  Genitals (private parts) with your normal soap.             6.  Wash thoroughly, paying special attention to the area where your surgery  will be performed.  7.  Thoroughly rinse your body with  warm water from the neck down.  8.  DO NOT shower/wash with your normal soap after using and rinsing off  the CHG Soap.                9.  Pat yourself dry with a clean towel.  10.  Wear clean pajamas.            11.  Place clean sheets on your bed the night of your first shower and do not  sleep with pets. Day of Surgery : Do not apply any lotions/deodorants the morning of surgery.  Please wear clean clothes to the hospital/surgery center.  FAILURE TO FOLLOW THESE INSTRUCTIONS MAY RESULT IN THE CANCELLATION OF YOUR SURGERY PATIENT SIGNATURE_________________________________  NURSE SIGNATURE__________________________________  ________________________________________________________________________

## 2023-08-02 NOTE — TOC Initial Note (Signed)
Transition of Care [facility]) - Initial/Assessment Note    Patient Details  Name: [name] MRN: [id number] Date of Birth: [birth year]  Transition of Care [facility]) CM/SW Contact:    Darleene Cleaver, LCSW Phone Number: 08/02/2023, 5:12 PM  Clinical Narrative:                  Patient is an 88 year old female who is normally alert and oriented x4.  CSW attempted to complete assessment but patient was sleeping.  CSW contacted patient's daughter [name] to complete assessment via phone, and to complete high risk readmission due to patient having a  31% readmission risk.  Per patient's daughter, patient still drives to doctors' appointments and she lives alone.  Patient does not have any problem paying for her medication and she does have a PCP.  Per patient's daughter, she is not currently receiving any home health services, and she is interested.  Patient's daughter asked if patient would be able to get a shower chair, CSW informed her that insurance does not pay for it, so they would have to pay for one themselves.  Patient already has a rolling walker at home, which patient normally does not use unless she has to.  Per patient's daughter she is in agreement for patient coming home with home health at this time.  TOC to continue to follow patient's progress throughout discharge planning.   Expected Discharge Plan: Home w Home Health Services Barriers to Discharge: Continued Medical Work up   Patient Goals and CMS Choice Patient states their goals for this hospitalization and ongoing recovery are:: To return home with home health. CMS Medicare.gov Compare Post Acute Care list provided to:: Patient Represenative (must comment) (Patient's daughter [name]) Choice offered to / list presented to : Adult Children [place] ownership interest in [facility].provided to:: Adult Children    Expected Discharge Plan and Services In-house Referral: Clinical Social Work   Post Acute Care Choice:  Home Health Living arrangements for the past 2 months: Single Family Home                                      Prior Living Arrangements/Services Living arrangements for the past 2 months: Single Family Home Lives with:: Self Patient language and need for interpreter reviewed:: Yes Do you feel safe going back to the place where you live?: Yes      Need for Family Participation in Patient Care: No (Comment) Care giver support system in place?: No (comment)   Criminal Activity/Legal Involvement Pertinent to Current Situation/Hospitalization: No - Comment as needed  Activities of Daily Living   ADL Screening (condition at time of admission) Independently performs ADLs?: Yes (appropriate for developmental age) Is the patient deaf or have difficulty hearing?: No Does the patient have difficulty seeing, even when wearing glasses/contacts?: No Does the patient have difficulty concentrating, remembering, or making decisions?: No  Permission Sought/Granted Permission sought to share information with : Case Manager, Family Supports Permission granted to share information with : Yes, Verbal Permission Granted, Yes, Release of Information Signed  Share Information with NAME: Muse,Karen Daughter [phone]  Frankey Shown Granddaughter [phone]    Marley,Vincent Nephew   [phone]  Permission granted to share info w AGENCY: HH agencies        Emotional Assessment Appearance:: Appears stated age   Affect (typically observed): Accepting, Appropriate, Calm, Pleasant, Stable Orientation: :  Oriented to Self, Oriented to Place, Oriented to  Time, Oriented to Situation Alcohol / Substance Use: Not Applicable Psych Involvement: No (comment)  Admission diagnosis:  DKA (diabetic ketoacidosis) (HCC) [E11.10] Diabetic ketoacidosis without coma associated with type 1 diabetes mellitus (HCC) [E10.10] Patient Active Problem List   Diagnosis Date Noted   Pressure  injury of skin 07/31/2023   Pseudohyponatremia 07/31/2023   History of recurrent UTIs 07/31/2023   Acquired hypercoagulable state (HCC) 04/06/2023   Fatty liver 04/06/2023   Malignant neoplasm of urinary bladder (HCC) 04/06/2023   Fall at home, subsequent encounter 05/03/2022   Unsteady gait 05/03/2022   Scalp laceration 05/03/2022   First degree AV block 05/03/2022   Occult blood in stools    Rectal ulcer    Anemia 02/05/2022   Stage 3b chronic kidney disease (CKD) (HCC) 01/27/2022   Atrial fibrillation with rapid ventricular response (HCC) 01/25/2022   Elevated transaminase level 01/25/2022   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   Hypokalemia 09/02/2021   Acute renal failure superimposed on stage 3a chronic kidney disease (HCC) 09/01/2021   COVID-19 virus infection 03/13/2021   DKA (diabetic ketoacidosis) (HCC) 03/13/2021   Hyperglycemia 03/23/2020   LADA (latent autoimmune diabetes in adults), 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   Hypercholesterolemia 03/15/2018   Left hip pain 06/07/2017   Hypothyroidism 02/03/2015   Essential hypertension 07/10/2013   Uncontrolled type 2 diabetes mellitus with hyperglycemia, with long-term current use of insulin (HCC) 07/10/2013   Upper respiratory infection 07/10/2013   PAF (paroxysmal atrial fibrillation) (HCC) 12/26/2012   Postop check 04/13/2011   PCP:  [name], MD Pharmacy:   [phone] - [place], [place] - [address] AT [facility] [address] [place] [address] Phone: [phone] Fax: [phone]  [id number] - [place], [place] - [address] [place] [address] Phone: [phone] Fax: [phone]     Social Determinants of Health (SDOH) Social History: SDOH Screenings   Food Insecurity: No Food Insecurity (07/31/2023)  Housing: Low Risk  (07/31/2023)  Transportation Needs: No Transportation Needs (07/31/2023)   Utilities: Not At Risk (07/31/2023)  Depression (PHQ2-9): Low Risk  (11/30/2022)  Financial Resource Strain: Medium Risk (06/01/2023)  Tobacco Use: Medium Risk (07/31/2023)   SDOH Interventions:     Readmission Risk Interventions    08/02/2023    5:09 PM 01/27/2022   12:37 PM  Readmission Risk Prevention Plan  Transportation Screening Complete Complete  PCP or Specialist Appt within 3-5 Days  Complete  HRI or Home Care Consult  Complete  Social Work Consult for Recovery Care Planning/Counseling  Complete  Palliative Care Screening  Not Applicable  Medication Review Oceanographer) Referral to Pharmacy Complete  PCP or Specialist appointment within 3-5 days of discharge Complete   HRI or Home Care Consult Complete   SW Recovery Care/Counseling Consult Complete   Palliative Care Screening Not Applicable   Skilled Nursing Facility Not Applicable

## 2023-08-02 NOTE — Progress Notes (Signed)
Lab 07/31/23 0930  LIPASE 18   No results for input(s): "AMMONIA" in the last 168 hours. Coagulation Profile: No results for input(s): "INR", "PROTIME" in the last 168 hours. Cardiac Enzymes: No results for input(s): "CKTOTAL", "CKMB", "CKMBINDEX", "TROPONINI" in the last 168 hours. BNP (last 3 results) No results for input(s): "PROBNP" in the last 8760 hours. HbA1C: No results for input(s): "HGBA1C" in the last 72 hours. CBG: Recent Labs  Lab 08/02/23 0742 08/02/23 1149 08/02/23 1151 08/02/23 1232 08/02/23 1616  GLUCAP 201* 61* 58* 89 244*   Lipid Profile: No results for input(s): "CHOL", "HDL", "LDLCALC", "TRIG", "CHOLHDL", "LDLDIRECT" in the last 72 hours. Thyroid Function Tests: No results for input(s): "TSH", "T4TOTAL", "FREET4", "T3FREE", "THYROIDAB" in the last 72 hours. Anemia Panel: No results for input(s): "VITAMINB12", "FOLATE", "FERRITIN", "TIBC", "IRON", "RETICCTPCT" in the last 72 hours. Sepsis Labs: No results for input(s): "PROCALCITON", "LATICACIDVEN" in the last 168 hours.  Recent Results (from the past 240 hour(s))  MRSA Next Gen by PCR, Nasal     Status: None   Collection Time: 07/31/23  1:03 PM   Specimen: Nasal Mucosa; Nasal Swab  Result Value Ref Range Status   MRSA by PCR Next Gen NOT DETECTED NOT DETECTED Final    Comment: (NOTE) The GeneXpert MRSA Assay (FDA approved for NASAL specimens only), is one component of a comprehensive MRSA colonization surveillance program. It is not  intended to diagnose MRSA infection nor to guide or monitor treatment for MRSA infections. Test performance is not FDA approved in patients less than 24 years old. Performed at [facility], [address], [place], [place] [address]   Urine Culture (for pregnant, neutropenic or urologic patients or patients with an indwelling urinary catheter)     Status: Abnormal   Collection Time: 08/01/23  3:56 PM   Specimen: Urine, Clean Catch  Result Value Ref Range Status   Specimen Description   Final    URINE, CLEAN CATCH Performed at [facility], [address] [place], [place] [address]    Special Requests   Final    NONE Performed at [facility], [address] [place], [place] [address]    Culture (A)  Final    <10,000 COLONIES/mL INSIGNIFICANT GROWTH Performed at [facility], [address], [place], [place] [address]    Report Status 08/02/2023 FINAL  Final    Radiology Studies: VAS [place] UPPER EXTREMITY VENOUS DUPLEX  Result Date: 08/01/2023 UPPER VENOUS STUDY  Patient Name:  [name]  Date of Exam:   08/01/2023 Medical Rec #: [id number]     Accession #:    [id number] Date of Birth: 06/29/1935     Patient Gender: F Patient Age:   41 years Exam Location:  [facility] Procedure:      VAS [place] UPPER EXTREMITY VENOUS DUPLEX Referring Phys: Marguerita Merles --------------------------------------------------------------------------------  Indications: Swelling Risk Factors: Immobility past pregnancy. Comparison Study: No prior study Performing Technologist: Shona Simpson  Examination Guidelines: A complete evaluation includes B-mode imaging, spectral Doppler, color Doppler, and power Doppler as needed of all accessible portions of each vessel. Bilateral testing is considered an integral part of a complete examination. Limited examinations for reoccurring indications may be performed as noted.  Right Findings:  +----------+------------+---------+-----------+----------+-------+ RIGHT     CompressiblePhasicitySpontaneousPropertiesSummary +----------+------------+---------+-----------+----------+-------+ IJV           Full       Yes       Yes                      +----------+------------+---------+-----------+----------+-------+  Lab 07/31/23 0930  LIPASE 18   No results for input(s): "AMMONIA" in the last 168 hours. Coagulation Profile: No results for input(s): "INR", "PROTIME" in the last 168 hours. Cardiac Enzymes: No results for input(s): "CKTOTAL", "CKMB", "CKMBINDEX", "TROPONINI" in the last 168 hours. BNP (last 3 results) No results for input(s): "PROBNP" in the last 8760 hours. HbA1C: No results for input(s): "HGBA1C" in the last 72 hours. CBG: Recent Labs  Lab 08/02/23 0742 08/02/23 1149 08/02/23 1151 08/02/23 1232 08/02/23 1616  GLUCAP 201* 61* 58* 89 244*   Lipid Profile: No results for input(s): "CHOL", "HDL", "LDLCALC", "TRIG", "CHOLHDL", "LDLDIRECT" in the last 72 hours. Thyroid Function Tests: No results for input(s): "TSH", "T4TOTAL", "FREET4", "T3FREE", "THYROIDAB" in the last 72 hours. Anemia Panel: No results for input(s): "VITAMINB12", "FOLATE", "FERRITIN", "TIBC", "IRON", "RETICCTPCT" in the last 72 hours. Sepsis Labs: No results for input(s): "PROCALCITON", "LATICACIDVEN" in the last 168 hours.  Recent Results (from the past 240 hour(s))  MRSA Next Gen by PCR, Nasal     Status: None   Collection Time: 07/31/23  1:03 PM   Specimen: Nasal Mucosa; Nasal Swab  Result Value Ref Range Status   MRSA by PCR Next Gen NOT DETECTED NOT DETECTED Final    Comment: (NOTE) The GeneXpert MRSA Assay (FDA approved for NASAL specimens only), is one component of a comprehensive MRSA colonization surveillance program. It is not  intended to diagnose MRSA infection nor to guide or monitor treatment for MRSA infections. Test performance is not FDA approved in patients less than 24 years old. Performed at [facility], [address], [place], [place] [address]   Urine Culture (for pregnant, neutropenic or urologic patients or patients with an indwelling urinary catheter)     Status: Abnormal   Collection Time: 08/01/23  3:56 PM   Specimen: Urine, Clean Catch  Result Value Ref Range Status   Specimen Description   Final    URINE, CLEAN CATCH Performed at [facility], [address] [place], [place] [address]    Special Requests   Final    NONE Performed at [facility], [address] [place], [place] [address]    Culture (A)  Final    <10,000 COLONIES/mL INSIGNIFICANT GROWTH Performed at [facility], [address], [place], [place] [address]    Report Status 08/02/2023 FINAL  Final    Radiology Studies: VAS [place] UPPER EXTREMITY VENOUS DUPLEX  Result Date: 08/01/2023 UPPER VENOUS STUDY  Patient Name:  [name]  Date of Exam:   08/01/2023 Medical Rec #: [id number]     Accession #:    [id number] Date of Birth: 06/29/1935     Patient Gender: F Patient Age:   41 years Exam Location:  [facility] Procedure:      VAS [place] UPPER EXTREMITY VENOUS DUPLEX Referring Phys: Marguerita Merles --------------------------------------------------------------------------------  Indications: Swelling Risk Factors: Immobility past pregnancy. Comparison Study: No prior study Performing Technologist: Shona Simpson  Examination Guidelines: A complete evaluation includes B-mode imaging, spectral Doppler, color Doppler, and power Doppler as needed of all accessible portions of each vessel. Bilateral testing is considered an integral part of a complete examination. Limited examinations for reoccurring indications may be performed as noted.  Right Findings:  +----------+------------+---------+-----------+----------+-------+ RIGHT     CompressiblePhasicitySpontaneousPropertiesSummary +----------+------------+---------+-----------+----------+-------+ IJV           Full       Yes       Yes                      +----------+------------+---------+-----------+----------+-------+  PROGRESS NOTE    Tristen Bermea  ZOX:[id number] DOB: [birth year] DOA: 07/31/2023 PCP: [name], MD   Brief Narrative:  The patient is an elderly 88 year old AAF with a past medical history significant for problem 2 anemia, arthritis, colonic polyps, history of heart murmur, hyperlipidemia, hypertension, hypothyroidism, history of pneumonia, 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  She is also noted to be disoriented and further workup was done and revealed abnormal LFTs and DKA.  She is admitted placed on insulin drip and given IV fluid hydration  **Interim History She continues to feel weak and her gap is not closed yet but her beta hydroxybutyrate acid has improved and resolved.  She was transitioned to long-acting insulin regimen overnight and PT OT evaluated and recommending home health.  Nutritionist evaluated for her poor p.o. intake and have recommended supplements.  She was transferred to the medical floor given her stability and anticipate discharge in next 24 to 48 hours if she continues to further improve  Assessment and Plan:  LADA (latent autoimmune diabetes in adults), managed as type 1 (HCC) presenting as  DKA (diabetic ketoacidosis) (HCC) With associated Pseudohyponatremia -Monitor CBG closely. CBG Trend: Recent Labs  Lab 08/02/23 0143 08/02/23 0244 08/02/23 0742 08/02/23 1149 08/02/23 1151 08/02/23 1232 08/02/23 1616  GLUCAP 133* 93 201* 61* 58* 89 244*  -C/w BMP every 4 hours and BHA every 8 hours. -Her beta hydroxybutyrate acid went from >8.00 -> 4.31 -> 1.19 -> 0.86 and is now 0.25 -Replace electrolytes as needed. -Consulted diabetes coordinator. -WBC Trend: Recent Labs  Lab 07/12/23 2039 07/31/23 0930 08/01/23 0152 08/01/23 1143 08/02/23 0315  WBC 7.7 11.3* 12.8* 9.4 6.3  -Given her improvement she was transitioned to on Semglee 11 units daily and continue  insulin drip for 2 hours after Semglee is given and then DC the insulin drip.  Will also add a NovoLog sensitive sliding scale before meals and at bedtime and start meal coverage if she is allowed to eat with 4 units of NovoLog 3 times daily with meals and hold for n.p.o. and hold if she eats less than 50% of her meals   PAF (paroxysmal atrial fibrillation) (HCC) -Continue amiodarone 100 mg p.o. daily. -Continue apixaban 2.5 mg p.o. twice daily. -BP soft so hold Antihypertensives for now and likely resume in the a.m. -Continue to Monitor on Telemetry   AKI on Stage 3b chronic kidney disease (CKD) (HCC), improved  Metabolic Acidosis -BUN/Cr Trend: Recent Labs  Lab 07/31/23 1809 07/31/23 2133 08/01/23 0152 08/01/23 0705 08/01/23 1143 08/01/23 1854 08/02/23 0315  BUN 27* 24* 23 21 19 15 13   CREATININE 1.51* 1.25* 1.07* 1.18* 1.03* 0.95 0.91  -Avoid Nephrotoxic Medications, Contrast Dyes, Hypotension and Dehydration to Ensure Adequate Renal Perfusion and will need to Renally Adjust Meds -IVF now stopped -Has a metabolic acidosis with a CO2 of 18, chloride level of 70, anion gap of 110; replete with p.o. sodium bicarbonate 650 mg p.o. twice daily -Continue to Monitor and Trend Renal Function carefully and repeat CMP in the AM   Postablative Hypothyroidism -Continue Levothyroxine 112 mcg p.o. daily.  Abnormal LFTs and Hyperbilirubinemia, improving slowly -AST, ALT, and Bilirubin Trend: Recent Labs  Lab 07/12/23 2039 07/31/23 0930 08/01/23 0705 08/01/23 1143 08/01/23 1854 08/02/23 0315  AST 35 136* 292* 262* 238* 173*  ALT 50* 84* 183* 176* 176* 150*  BILITOT 0.9 1.9* 0.6 0.7 0.6 0.5  -  PROGRESS NOTE    Tristen Bermea  ZOX:[id number] DOB: [birth year] DOA: 07/31/2023 PCP: [name], MD   Brief Narrative:  The patient is an elderly 88 year old AAF with a past medical history significant for problem 2 anemia, arthritis, colonic polyps, history of heart murmur, hyperlipidemia, hypertension, hypothyroidism, history of pneumonia, 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  She is also noted to be disoriented and further workup was done and revealed abnormal LFTs and DKA.  She is admitted placed on insulin drip and given IV fluid hydration  **Interim History She continues to feel weak and her gap is not closed yet but her beta hydroxybutyrate acid has improved and resolved.  She was transitioned to long-acting insulin regimen overnight and PT OT evaluated and recommending home health.  Nutritionist evaluated for her poor p.o. intake and have recommended supplements.  She was transferred to the medical floor given her stability and anticipate discharge in next 24 to 48 hours if she continues to further improve  Assessment and Plan:  LADA (latent autoimmune diabetes in adults), managed as type 1 (HCC) presenting as  DKA (diabetic ketoacidosis) (HCC) With associated Pseudohyponatremia -Monitor CBG closely. CBG Trend: Recent Labs  Lab 08/02/23 0143 08/02/23 0244 08/02/23 0742 08/02/23 1149 08/02/23 1151 08/02/23 1232 08/02/23 1616  GLUCAP 133* 93 201* 61* 58* 89 244*  -C/w BMP every 4 hours and BHA every 8 hours. -Her beta hydroxybutyrate acid went from >8.00 -> 4.31 -> 1.19 -> 0.86 and is now 0.25 -Replace electrolytes as needed. -Consulted diabetes coordinator. -WBC Trend: Recent Labs  Lab 07/12/23 2039 07/31/23 0930 08/01/23 0152 08/01/23 1143 08/02/23 0315  WBC 7.7 11.3* 12.8* 9.4 6.3  -Given her improvement she was transitioned to on Semglee 11 units daily and continue  insulin drip for 2 hours after Semglee is given and then DC the insulin drip.  Will also add a NovoLog sensitive sliding scale before meals and at bedtime and start meal coverage if she is allowed to eat with 4 units of NovoLog 3 times daily with meals and hold for n.p.o. and hold if she eats less than 50% of her meals   PAF (paroxysmal atrial fibrillation) (HCC) -Continue amiodarone 100 mg p.o. daily. -Continue apixaban 2.5 mg p.o. twice daily. -BP soft so hold Antihypertensives for now and likely resume in the a.m. -Continue to Monitor on Telemetry   AKI on Stage 3b chronic kidney disease (CKD) (HCC), improved  Metabolic Acidosis -BUN/Cr Trend: Recent Labs  Lab 07/31/23 1809 07/31/23 2133 08/01/23 0152 08/01/23 0705 08/01/23 1143 08/01/23 1854 08/02/23 0315  BUN 27* 24* 23 21 19 15 13   CREATININE 1.51* 1.25* 1.07* 1.18* 1.03* 0.95 0.91  -Avoid Nephrotoxic Medications, Contrast Dyes, Hypotension and Dehydration to Ensure Adequate Renal Perfusion and will need to Renally Adjust Meds -IVF now stopped -Has a metabolic acidosis with a CO2 of 18, chloride level of 70, anion gap of 110; replete with p.o. sodium bicarbonate 650 mg p.o. twice daily -Continue to Monitor and Trend Renal Function carefully and repeat CMP in the AM   Postablative Hypothyroidism -Continue Levothyroxine 112 mcg p.o. daily.  Abnormal LFTs and Hyperbilirubinemia, improving slowly -AST, ALT, and Bilirubin Trend: Recent Labs  Lab 07/12/23 2039 07/31/23 0930 08/01/23 0705 08/01/23 1143 08/01/23 1854 08/02/23 0315  AST 35 136* 292* 262* 238* 173*  ALT 50* 84* 183* 176* 176* 150*  BILITOT 0.9 1.9* 0.6 0.7 0.6 0.5  -  Lab 07/31/23 0930  LIPASE 18   No results for input(s): "AMMONIA" in the last 168 hours. Coagulation Profile: No results for input(s): "INR", "PROTIME" in the last 168 hours. Cardiac Enzymes: No results for input(s): "CKTOTAL", "CKMB", "CKMBINDEX", "TROPONINI" in the last 168 hours. BNP (last 3 results) No results for input(s): "PROBNP" in the last 8760 hours. HbA1C: No results for input(s): "HGBA1C" in the last 72 hours. CBG: Recent Labs  Lab 08/02/23 0742 08/02/23 1149 08/02/23 1151 08/02/23 1232 08/02/23 1616  GLUCAP 201* 61* 58* 89 244*   Lipid Profile: No results for input(s): "CHOL", "HDL", "LDLCALC", "TRIG", "CHOLHDL", "LDLDIRECT" in the last 72 hours. Thyroid Function Tests: No results for input(s): "TSH", "T4TOTAL", "FREET4", "T3FREE", "THYROIDAB" in the last 72 hours. Anemia Panel: No results for input(s): "VITAMINB12", "FOLATE", "FERRITIN", "TIBC", "IRON", "RETICCTPCT" in the last 72 hours. Sepsis Labs: No results for input(s): "PROCALCITON", "LATICACIDVEN" in the last 168 hours.  Recent Results (from the past 240 hour(s))  MRSA Next Gen by PCR, Nasal     Status: None   Collection Time: 07/31/23  1:03 PM   Specimen: Nasal Mucosa; Nasal Swab  Result Value Ref Range Status   MRSA by PCR Next Gen NOT DETECTED NOT DETECTED Final    Comment: (NOTE) The GeneXpert MRSA Assay (FDA approved for NASAL specimens only), is one component of a comprehensive MRSA colonization surveillance program. It is not  intended to diagnose MRSA infection nor to guide or monitor treatment for MRSA infections. Test performance is not FDA approved in patients less than 24 years old. Performed at [facility], [address], [place], [place] [address]   Urine Culture (for pregnant, neutropenic or urologic patients or patients with an indwelling urinary catheter)     Status: Abnormal   Collection Time: 08/01/23  3:56 PM   Specimen: Urine, Clean Catch  Result Value Ref Range Status   Specimen Description   Final    URINE, CLEAN CATCH Performed at [facility], [address] [place], [place] [address]    Special Requests   Final    NONE Performed at [facility], [address] [place], [place] [address]    Culture (A)  Final    <10,000 COLONIES/mL INSIGNIFICANT GROWTH Performed at [facility], [address], [place], [place] [address]    Report Status 08/02/2023 FINAL  Final    Radiology Studies: VAS [place] UPPER EXTREMITY VENOUS DUPLEX  Result Date: 08/01/2023 UPPER VENOUS STUDY  Patient Name:  [name]  Date of Exam:   08/01/2023 Medical Rec #: [id number]     Accession #:    [id number] Date of Birth: 06/29/1935     Patient Gender: F Patient Age:   41 years Exam Location:  [facility] Procedure:      VAS [place] UPPER EXTREMITY VENOUS DUPLEX Referring Phys: Marguerita Merles --------------------------------------------------------------------------------  Indications: Swelling Risk Factors: Immobility past pregnancy. Comparison Study: No prior study Performing Technologist: Shona Simpson  Examination Guidelines: A complete evaluation includes B-mode imaging, spectral Doppler, color Doppler, and power Doppler as needed of all accessible portions of each vessel. Bilateral testing is considered an integral part of a complete examination. Limited examinations for reoccurring indications may be performed as noted.  Right Findings:  +----------+------------+---------+-----------+----------+-------+ RIGHT     CompressiblePhasicitySpontaneousPropertiesSummary +----------+------------+---------+-----------+----------+-------+ IJV           Full       Yes       Yes                      +----------+------------+---------+-----------+----------+-------+  PROGRESS NOTE    Tristen Bermea  ZOX:[id number] DOB: [birth year] DOA: 07/31/2023 PCP: [name], MD   Brief Narrative:  The patient is an elderly 88 year old AAF with a past medical history significant for problem 2 anemia, arthritis, colonic polyps, history of heart murmur, hyperlipidemia, hypertension, hypothyroidism, history of pneumonia, 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  She is also noted to be disoriented and further workup was done and revealed abnormal LFTs and DKA.  She is admitted placed on insulin drip and given IV fluid hydration  **Interim History She continues to feel weak and her gap is not closed yet but her beta hydroxybutyrate acid has improved and resolved.  She was transitioned to long-acting insulin regimen overnight and PT OT evaluated and recommending home health.  Nutritionist evaluated for her poor p.o. intake and have recommended supplements.  She was transferred to the medical floor given her stability and anticipate discharge in next 24 to 48 hours if she continues to further improve  Assessment and Plan:  LADA (latent autoimmune diabetes in adults), managed as type 1 (HCC) presenting as  DKA (diabetic ketoacidosis) (HCC) With associated Pseudohyponatremia -Monitor CBG closely. CBG Trend: Recent Labs  Lab 08/02/23 0143 08/02/23 0244 08/02/23 0742 08/02/23 1149 08/02/23 1151 08/02/23 1232 08/02/23 1616  GLUCAP 133* 93 201* 61* 58* 89 244*  -C/w BMP every 4 hours and BHA every 8 hours. -Her beta hydroxybutyrate acid went from >8.00 -> 4.31 -> 1.19 -> 0.86 and is now 0.25 -Replace electrolytes as needed. -Consulted diabetes coordinator. -WBC Trend: Recent Labs  Lab 07/12/23 2039 07/31/23 0930 08/01/23 0152 08/01/23 1143 08/02/23 0315  WBC 7.7 11.3* 12.8* 9.4 6.3  -Given her improvement she was transitioned to on Semglee 11 units daily and continue  insulin drip for 2 hours after Semglee is given and then DC the insulin drip.  Will also add a NovoLog sensitive sliding scale before meals and at bedtime and start meal coverage if she is allowed to eat with 4 units of NovoLog 3 times daily with meals and hold for n.p.o. and hold if she eats less than 50% of her meals   PAF (paroxysmal atrial fibrillation) (HCC) -Continue amiodarone 100 mg p.o. daily. -Continue apixaban 2.5 mg p.o. twice daily. -BP soft so hold Antihypertensives for now and likely resume in the a.m. -Continue to Monitor on Telemetry   AKI on Stage 3b chronic kidney disease (CKD) (HCC), improved  Metabolic Acidosis -BUN/Cr Trend: Recent Labs  Lab 07/31/23 1809 07/31/23 2133 08/01/23 0152 08/01/23 0705 08/01/23 1143 08/01/23 1854 08/02/23 0315  BUN 27* 24* 23 21 19 15 13   CREATININE 1.51* 1.25* 1.07* 1.18* 1.03* 0.95 0.91  -Avoid Nephrotoxic Medications, Contrast Dyes, Hypotension and Dehydration to Ensure Adequate Renal Perfusion and will need to Renally Adjust Meds -IVF now stopped -Has a metabolic acidosis with a CO2 of 18, chloride level of 70, anion gap of 110; replete with p.o. sodium bicarbonate 650 mg p.o. twice daily -Continue to Monitor and Trend Renal Function carefully and repeat CMP in the AM   Postablative Hypothyroidism -Continue Levothyroxine 112 mcg p.o. daily.  Abnormal LFTs and Hyperbilirubinemia, improving slowly -AST, ALT, and Bilirubin Trend: Recent Labs  Lab 07/12/23 2039 07/31/23 0930 08/01/23 0705 08/01/23 1143 08/01/23 1854 08/02/23 0315  AST 35 136* 292* 262* 238* 173*  ALT 50* 84* 183* 176* 176* 150*  BILITOT 0.9 1.9* 0.6 0.7 0.6 0.5  -

## 2023-08-02 NOTE — Evaluation (Signed)
Occupational Therapy Evaluation Patient Details Name: [name] MRN: [id number] DOB: [birth year] Today's Date: 08/02/2023   History of Present Illness Patient is a 88 year old female who presented on 10/20 with weakness and disorientation. 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 WUJ:WJXBJY, arthritis, colon polyps, heart murmur, hyperlipidemia, hypertension, hypothyroidism, history of pneumonia, type 1 diabetes   Clinical Impression   Patient is a 88 year old female who was admitted for above. 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 Patient was noted to have decreased functional activity tolerance, decreased endurance, decreased standing balance, decreased safety awareness, and decreased knowledge of AD/AE impacting participation in ADLs.  Patient reported plan was to d/c home with family support and [facility] services. OT to continue to follow.        If plan is discharge home, recommend the following: A lot of help with bathing/dressing/bathroom;Assistance with cooking/housework;Direct supervision/assist for medications management;Assist for transportation;Help with stairs or ramp for entrance;Direct supervision/assist for financial management;A lot of help with walking and/or transfers    Functional Status Assessment  Patient has had a recent decline in their functional status and demonstrates the ability to make significant improvements in function in a reasonable and predictable amount of time.  Equipment Recommendations  None recommended by OT       Precautions / Restrictions Precautions Precautions: Fall Restrictions Weight Bearing Restrictions: No      Mobility Bed Mobility Overal bed mobility: Needs Assistance Bed Mobility: Supine to Sit     Supine to sit: Min assist     General bed mobility comments: min support to move to  sitting    Transfers Overall transfer level: Needs assistance Equipment used: 2 person hand held assist Transfers: Sit to/from Stand Sit to Stand: +2 safety/equipment, Mod assist           General transfer comment: mod support to stand   and pivot step to recliner      Balance Overall balance assessment: Needs assistance   Sitting balance-Leahy Scale: Fair       Standing balance-Leahy Scale: Poor       ADL either performed or assessed with clinical judgement   ADL Overall ADL's : Needs assistance/impaired Eating/Feeding: Modified independent;Sitting Eating/Feeding Details (indicate cue type and reason): in recliner Grooming: Sitting;Minimal assistance   Upper Body Bathing: Sitting;Minimal assistance   Lower Body Bathing: Sitting/lateral leans;Moderate assistance   Upper Body Dressing : Sitting;Minimal assistance   Lower Body Dressing: Sitting/lateral leans;Maximal assistance   Toilet Transfer: Minimal assistance;+2 for physical assistance;+2 for safety/equipment;Stand-pivot;Rolling walker (2 wheels) Toilet Transfer Details (indicate cue type and reason): to recliner in room. Toileting- Clothing Manipulation and Hygiene: +2 for safety/equipment;+2 for physical assistance;Minimal assistance;Sit to/from stand               Vision Patient Visual Report: No change from baseline              Pertinent Vitals/Pain Pain Assessment Pain Assessment: No/denies pain     Extremity/Trunk Assessment Upper Extremity Assessment Upper Extremity Assessment: Overall WFL for tasks assessed;Right hand dominant   Lower Extremity Assessment Lower Extremity Assessment: Generalized weakness   Cervical / Trunk Assessment Cervical / Trunk Assessment: Normal   Communication Communication Communication: No apparent difficulties   Cognition Arousal: Alert Behavior During Therapy: WFL for tasks assessed/performed Overall Cognitive Status: Within Functional Limits for  tasks assessed       General Comments: patient  was alittle HOH during session. patient's granddaughter was present during session.                Home Living Family/patient expects to be discharged to:: Private residence Living Arrangements: Alone Available Help at Discharge: Family;Available PRN/intermittently Type of Home: House Home Access: Stairs to enter Entrance Stairs-Number of Steps: 1-2 Entrance Stairs-Rails: None       Bathroom Shower/Tub: Tub/shower unit         Home Equipment: Agricultural consultant (2 wheels);Cane - single point          Prior Functioning/Environment Prior Level of Function : Independent/Modified Independent;Driving;Working/employed             Mobility Comments: still drives and works in housekeeping ADLs Comments: 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        OT Problem List: Decreased activity tolerance;Impaired balance (sitting and/or standing);Decreased coordination;Decreased safety awareness;Decreased knowledge of use of DME or AE;Decreased knowledge of precautions      OT Treatment/Interventions: Self-care/ADL training;Therapeutic activities;Patient/family education;DME and/or AE instruction;Therapeutic exercise;Balance training    OT Goals(Current goals can be found in the care plan section) Acute Rehab OT Goals Patient Stated Goal: to go home OT Goal Formulation: With patient Time For Goal Achievement: 08/16/23 Potential to Achieve Goals: Fair  OT Frequency: Min 1X/week    Co-evaluation PT/OT/SLP Co-Evaluation/Treatment: Yes Reason for Co-Treatment: To address functional/ADL transfers PT goals addressed during session: Mobility/safety with mobility OT goals addressed during session: ADL's and self-care      AM-PAC OT "6 Clicks" Daily Activity     Outcome Measure Help from another person eating meals?: A Little Help from another person taking care of personal grooming?: A Little Help from another  person toileting, which includes using toliet, bedpan, or urinal?: A Lot Help from another person bathing (including washing, rinsing, drying)?: A Lot Help from another person to put on and taking off regular upper body clothing?: A Little Help from another person to put on and taking off regular lower body clothing?: A Lot 6 Click Score: 15   End of Session Equipment Utilized During Treatment: Gait belt Nurse Communication: Mobility status  Activity Tolerance: Patient tolerated treatment well Patient left: in chair;with call bell/phone within reach;with chair alarm set  OT Visit Diagnosis: Unsteadiness on feet (R26.81);Other abnormalities of gait and mobility (R26.89);Muscle weakness (generalized) (M62.81)                Time: 1191-4782 OT Time Calculation (min): 18 min Charges:  OT General Charges $OT Visit: 1 Visit OT Evaluation $OT Eval Low Complexity: 1 Low  Eriel Dunckel OTR/L, MS Acute Rehabilitation Department Office# [phone]   Selinda Flavin 08/02/2023, 1:11 PM

## 2023-08-03 ENCOUNTER — Encounter (HOSPITAL_COMMUNITY)
Admission: RE | Admit: 2023-08-03 | Discharge: 2023-08-03 | Disposition: A | Discharge status: Home / Self Care | Payer: Medicare PPO | Source: Ambulatory Visit | Attending: Anesthesiology | Admitting: Anesthesiology

## 2023-08-03 DIAGNOSIS — E1010 Type 1 diabetes mellitus with ketoacidosis without coma: Secondary | ICD-10-CM | POA: Diagnosis not present

## 2023-08-03 DIAGNOSIS — N179 Acute kidney failure, unspecified: Secondary | ICD-10-CM

## 2023-08-03 DIAGNOSIS — N1832 Chronic kidney disease, stage 3b: Secondary | ICD-10-CM | POA: Diagnosis not present

## 2023-08-03 DIAGNOSIS — E109 Type 1 diabetes mellitus without complications: Secondary | ICD-10-CM

## 2023-08-03 LAB — CBC WITH DIFFERENTIAL/PLATELET
Abs Immature Granulocytes: 0.02 10*3/uL (ref 0.00–0.07)
Basophils Absolute: 0 10*3/uL (ref 0.0–0.1)
Basophils Relative: 1 %
Eosinophils Absolute: 0.2 10*3/uL (ref 0.0–0.5)
Eosinophils Relative: 3 %
HCT: 28.8 % — ABNORMAL LOW (ref 36.0–46.0)
Hemoglobin: 9.2 g/dL — ABNORMAL LOW (ref 12.0–15.0)
Immature Granulocytes: 1 %
Lymphocytes Relative: 24 %
Lymphs Abs: 1 10*3/uL (ref 0.7–4.0)
MCH: 29.2 pg (ref 26.0–34.0)
MCHC: 31.9 g/dL (ref 30.0–36.0)
MCV: 91.4 fL (ref 80.0–100.0)
Monocytes Absolute: 0.5 10*3/uL (ref 0.1–1.0)
Monocytes Relative: 11 %
Neutro Abs: 2.7 10*3/uL (ref 1.7–7.7)
Neutrophils Relative %: 60 %
Platelets: 160 10*3/uL (ref 150–400)
RBC: 3.15 MIL/uL — ABNORMAL LOW (ref 3.87–5.11)
RDW: 15.3 % (ref 11.5–15.5)
WBC: 4.4 10*3/uL (ref 4.0–10.5)
nRBC: 0 % (ref 0.0–0.2)

## 2023-08-03 LAB — GLUCOSE, CAPILLARY
Glucose-Capillary: 84 mg/dL (ref 70–99)
Glucose-Capillary: 221 mg/dL — ABNORMAL HIGH (ref 70–99)
Glucose-Capillary: 138 mg/dL — ABNORMAL HIGH (ref 70–99)
Glucose-Capillary: 152 mg/dL — ABNORMAL HIGH (ref 70–99)
Glucose-Capillary: 191 mg/dL — ABNORMAL HIGH (ref 70–99)

## 2023-08-03 LAB — COMPREHENSIVE METABOLIC PANEL
ALT: 103 U/L — ABNORMAL HIGH (ref 0–44)
AST: 70 U/L — ABNORMAL HIGH (ref 15–41)
Albumin: 2.2 g/dL — ABNORMAL LOW (ref 3.5–5.0)
Alkaline Phosphatase: 82 U/L (ref 38–126)
Anion gap: 6 (ref 5–15)
BUN: 13 mg/dL (ref 8–23)
CO2: 21 mmol/L — ABNORMAL LOW (ref 22–32)
Calcium: 7.9 mg/dL — ABNORMAL LOW (ref 8.9–10.3)
Chloride: 107 mmol/L (ref 98–111)
Creatinine, Ser: 0.86 mg/dL (ref 0.44–1.00)
GFR, Estimated: >60 mL/min (ref >60)
Glucose, Bld: 169 mg/dL — ABNORMAL HIGH (ref 70–99)
Potassium: 3.6 mmol/L (ref 3.5–5.1)
Sodium: 134 mmol/L — ABNORMAL LOW (ref 135–145)
Total Bilirubin: 0.4 mg/dL (ref 0.3–1.2)
Total Protein: 5 g/dL — ABNORMAL LOW (ref 6.5–8.1)

## 2023-08-03 LAB — RETICULOCYTES
Immature Retic Fract: 13.8 % (ref 2.3–15.9)
RBC.: 3.12 MIL/uL — ABNORMAL LOW (ref 3.87–5.11)
Retic Count, Absolute: 56.2 10*3/uL (ref 19.0–186.0)
Retic Ct Pct: 1.8 % (ref 0.4–3.1)

## 2023-08-03 LAB — VITAMIN B12: Vitamin B-12: 2203 pg/mL — ABNORMAL HIGH (ref 180–914)

## 2023-08-03 LAB — IRON AND TIBC
Iron: 37 ug/dL (ref 28–170)
Saturation Ratios: 21 % (ref 10.4–31.8)
TIBC: 175 ug/dL — ABNORMAL LOW (ref 250–450)
UIBC: 138 ug/dL

## 2023-08-03 LAB — MAGNESIUM: Magnesium: 1.7 mg/dL (ref 1.7–2.4)

## 2023-08-03 LAB — FOLATE: Folate: 7.2 ng/mL (ref >5.9)

## 2023-08-03 LAB — FERRITIN: Ferritin: 79 ng/mL (ref 11–307)

## 2023-08-03 LAB — PHOSPHORUS: Phosphorus: 2.6 mg/dL (ref 2.5–4.6)

## 2023-08-03 MED ORDER — POTASSIUM CHLORIDE CRYS ER 10 MEQ PO TBCR
20.0000 meq | EXTENDED_RELEASE_TABLET | Freq: Every day | ORAL | Status: DC
  Administered 2023-08-04: 20 meq via ORAL
  Filled 2023-08-03: qty 2

## 2023-08-03 MED ORDER — INSULIN ASPART 100 UNIT/ML IJ SOLN: 0.0000 [IU] | Freq: Every day | INTRAMUSCULAR | Status: DC

## 2023-08-03 MED ORDER — POTASSIUM CHLORIDE CRYS ER 10 MEQ PO TBCR
40.0000 meq | EXTENDED_RELEASE_TABLET | ORAL | Status: AC
  Administered 2023-08-03 (×2): 40 meq via ORAL
  Filled 2023-08-03: qty 4
  Filled 2023-08-03: qty 2

## 2023-08-03 MED ORDER — INSULIN ASPART 100 UNIT/ML IJ SOLN: 4.0000 [IU] | Freq: Three times a day (TID) | INTRAMUSCULAR | Status: DC

## 2023-08-03 MED ORDER — INSULIN ASPART 100 UNIT/ML IJ SOLN
0.0000 [IU] | Freq: Three times a day (TID) | INTRAMUSCULAR | Status: DC
  Administered 2023-08-03: 3 [IU] via SUBCUTANEOUS
  Administered 2023-08-03 – 2023-08-04 (×2): 5 [IU] via SUBCUTANEOUS
  Administered 2023-08-04: 3 [IU] via SUBCUTANEOUS

## 2023-08-03 NOTE — Progress Notes (Signed)
Physical Therapy Treatment Patient Details Name: [name] MRN: [id number] DOB: 25-Jul-1935 Today's Date: 08/03/2023   History of Present Illness Patient is a 88 year old female who presented on 10/20 with weakness and disorientation. 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 NWG:NFAOZH, arthritis, colon polyps, heart murmur, hyperlipidemia, hypertension, hypothyroidism, history of pneumonia, type 1 diabetes    PT Comments  The patient is eager to ambulate today, states that she feels stronger. \The patient reports that she is having urinary urgency. Patient able to ambulate  21' with  RW. BP after ambulation 126/51. The patient expresses feeling good that she ambulated today.  Patient  reports that she has been living in a motel while her home is being renovated and family is available.   If plan is discharge home, recommend the following: A little help with walking and/or transfers;A little help with bathing/dressing/bathroom;Assistance with cooking/housework;Direct supervision/assist for medications management;Assist for transportation;Help with stairs or ramp for entrance   Can travel by private vehicle        Equipment Recommendations  None recommended by PT    Recommendations for Other Services       Precautions / Restrictions Precautions Precautions: Fall Precaution Comments: monitor BP     Mobility  Bed Mobility               General bed mobility comments: in recliner    Transfers Overall transfer level: Needs assistance Equipment used: Rolling walker (2 wheels) Transfers: Sit to/from Stand Sit to Stand: Mod assist           General transfer comment: mod support to stand    from recliner to power up x 2    Ambulation/Gait Ambulation/Gait assistance: Min assist Gait Distance (Feet): 40 Feet Assistive device: Rolling walker (2 wheels) Gait  Pattern/deviations: Step-through pattern Gait velocity: decr     General Gait Details: initially unsteady  first 32' then improved the next 20'   Stairs             Wheelchair Mobility     Tilt Bed    Modified Rankin (Stroke Patients Only)       Balance Overall balance assessment: Mild deficits observed, not formally tested                                          Cognition Arousal: Alert Behavior During Therapy: WFL for tasks assessed/performed Overall Cognitive Status: Within Functional Limits for tasks assessed                                          Exercises      General Comments        Pertinent Vitals/Pain Pain Assessment Pain Assessment: No/denies pain    Home Living                          Prior Function            PT Goals (current goals can now be found in the care plan section) Progress towards PT goals: Progressing toward goals    Frequency    Min 1X/week      PT Plan      Co-evaluation  AM-PAC PT "6 Clicks" Mobility   Outcome Measure  Help needed turning from your back to your side while in a flat bed without using bedrails?: A Little Help needed moving from lying on your back to sitting on the side of a flat bed without using bedrails?: A Little Help needed moving to and from a bed to a chair (including a wheelchair)?: A Little Help needed standing up from a chair using your arms (e.g., wheelchair or bedside chair)?: A Little Help needed to walk in hospital room?: A Little Help needed climbing 3-5 steps with a railing? : A Lot 6 Click Score: 17    End of Session Equipment Utilized During Treatment: Gait belt Activity Tolerance: Patient tolerated treatment well Patient left: in chair;with call bell/phone within reach   PT Visit Diagnosis: Unsteadiness on feet (R26.81);Difficulty in walking, not elsewhere classified (R26.2)     Time: 2440-1027 PT Time  Calculation (min) (ACUTE ONLY): 46 min  Charges:    $Gait Training: 23-37 mins $Self Care/Home Management: 8-22 PT General Charges $$ ACUTE PT VISIT: 1 Visit                Blanchard Kelch PT Acute Rehabilitation Services Office [phone] Weekend [phone]    Rada Hay 08/03/2023, 1:16 PM

## 2023-08-03 NOTE — Progress Notes (Signed)
Durable Medical Equipment  (From admission, onward)           Start     Ordered   08/03/23 1613  For home use only DME Bedside commode  Once       Question:  Patient needs a bedside commode to treat with the following condition  Answer:  Osteoarthritis of left hip   08/03/23 1613            Patient: [name] DOB: 1935-03-27  [name] is confined to a single room and will need bedside commode for safety.

## 2023-08-03 NOTE — Inpatient Diabetes Management (Signed)
Inpatient Diabetes Program Recommendations  AACE/ADA: New Consensus Statement on Inpatient Glycemic Control (2015)  Target Ranges:  Prepandial:   less than 140 mg/dL      Peak postprandial:   less than 180 mg/dL (1-2 hours)      Critically ill patients:  140 - 180 mg/dL    Latest Reference Range & Units 11/16/22 14:56 03/22/23 14:46  Hemoglobin A1C 4.8 - 5.6 % 10.5 (H) 11.5 (H)  283 mg/dl    Latest Reference Range & Units 08/02/23 07:42 08/02/23 11:49 08/02/23 11:51 08/02/23 12:32 08/02/23 16:16 08/03/23 07:31  Glucose-Capillary 70 - 99 mg/dL 403 (H) 61 (L) 58 (L) 89 244 (H) 84    Admit with: DKA  History: LADA--Managed as Type 1 diabetes  Home DM Meds: Trulicity 0.75 mg Qweek        Novolog 2-8 units TID        Toujeo 11 units QHS  Current Orders:  Semglee 11 units Daily Novolog 0-15 units tid + hs Novolog 4 units tid meal coverage   MD- Hypoglycemia after Novolog and meal coverage doses  1. Pt may benefit from reducing Novolog correction to 0-9 units tid + hs 2.  Reduce Novolog meal coverage to 3 units tid if eating >50% of meals/supplements   --Will follow patient during hospitalization--  Christena Deem RN, MSN, BC-ADM Inpatient Diabetes Coordinator Team Pager [phone] (8a-5p)

## 2023-08-03 NOTE — Consult Note (Signed)
WOC Nurse Consult Note: Reason for Consult: pressure injury Wound type: Stage 2 Pressure Injury Pressure Injury POA: No Measurement: see nursing flow sheet Wound bed: clean and pink Drainage (amount, consistency, odor) none Periwound: intact  Dressing procedure/placement/frequency: Discussed with bedside nursing via  Continue to manage wound with nursing skin care order set. Change every 3 days and PRN soilage.  Turn and reposition at least every two hours   Re consult if needed, will not follow at this time. Thanks  Alexzavier Girardin [employer], RN,CWOCN, CNS, CWON-AP [phone])

## 2023-08-03 NOTE — TOC Progression Note (Addendum)
Transition of Care [facility]) - Progression Note    Patient Details  Name: [name] MRN: [id number] Date of Birth: 17-Aug-1935  [facility]) CM/SW Contact  Lavenia Atlas, RN Phone Number: 08/03/2023, 3:07 PM  Clinical Narrative:  This RNCM spoke with patient at bedside to offer choice ([email or web address]), patient chose any home health that will accept her insurance. Patient is agreeable to this RNCM returning her daughter's call regarding DME: bedside commode.  This RNCM spoke with patient's daughter [name] regarding process for bedside commode. [name] request bedside commode be delivered to bedside prior to discharge. [name] will obtain shower chair online due to being an out of pocket expense.   Notified Zack with [employer] for DME: bedside commode chair. Will notify MD re: need for DME orders with narrative.  Cindie with [facility] will follow patient post discharge for HHPT/OT services.  - 5:03pm This RNCM spoke with patient's daughter [name] who reports she will purchase shower chair online. This RNCM advised [facility] is the [facility] agency that will contact them within 24-48 hours from discharge to coordinate HHPT/OT services. [employer] [facility] is following for DME: bedside commode. Patient's daughter [name] did not have any further questions or concerns. Patient has transferred from ICU to room#1539.     TOC will continue to follow for needs.       Expected Discharge Plan: Home w Home Health Services Barriers to Discharge: Continued Medical Work up  Expected Discharge Plan and Services In-house Referral: NA Discharge Planning Services: CM Consult Post Acute Care Choice: Home Health Living arrangements for the past 2 months: Single Family Home                 DME Arranged: Bedside commode         HH Arranged: PT, Nurse's Aide           Social Determinants of Health (SDOH) Interventions SDOH Screenings   Food Insecurity: No Food Insecurity (07/31/2023)  Housing: Low Risk   (07/31/2023)  Transportation Needs: No Transportation Needs (07/31/2023)  Utilities: Not At Risk (07/31/2023)  Depression (PHQ2-9): Low Risk  (11/30/2022)  Financial Resource Strain: Medium Risk (06/01/2023)  Tobacco Use: Medium Risk (07/31/2023)    Readmission Risk Interventions    08/03/2023    2:12 PM 08/02/2023    5:09 PM 01/27/2022   12:37 PM  Readmission Risk Prevention Plan  Transportation Screening Complete Complete Complete  PCP or Specialist Appt within 3-5 Days   Complete  HRI or Home Care Consult   Complete  Social Work Consult for Recovery Care Planning/Counseling   Complete  Palliative Care Screening   Not Applicable  Medication Review [employer]) Complete Referral to Pharmacy Complete  PCP or Specialist appointment within 3-5 days of discharge Complete Complete   HRI or Home Care Consult Complete Complete   SW Recovery Care/Counseling Consult Complete Complete   Palliative Care Screening Not Applicable Not Applicable   Skilled Nursing Facility Not Applicable Not Applicable

## 2023-08-03 NOTE — Progress Notes (Addendum)
[facility] PROGRESS NOTE    Progress Note  Thania Dewaele  ZOX:[id number] DOB: 11-18-1934 DOA: 07/31/2023 PCP: [name], MD     Brief Narrative:   Davona Arbelaez is an 88 y.o. 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 Anion gap has closed bicarb greater than 20. She was transition to long-acting insulin plus sliding scale, go ahead and change her sliding scale to moderate with meal coverage.  Paroxysmal atrial fibrillation: Continue amiodarone and apixaban.  Acute kidney injury on chronic kidney disease stage IIIb: Likely prerenal azotemia from DKA. Was started on IV fluids her creatinine has returned to baseline.  Postablative hypothyroidism: Continue Synthroid.  Abnormal LFTs/bilirubin: Likely due to acute DKA, now improving.  Hypophosphatemia: Try to keep greater than 3.  Hypokalemia: Replete orally try to keep greater than 4.  Normocytic anemia: Follow-up PCP as an outpatient.  History of recurrent UTIs: Continue to measure them daily. Can resume Macrobid as an outpatient.  Generalized weakness: There are Derica. PT evaluated the patient recommended home health PT.  Hypoalbuminemia/moderate protein caloric malnutrition: Follow-up with PCP as an outpatient.  Stage II sacral decubitus ulcer present on admission: RN Pressure Injury Documentation: Pressure Injury 07/31/23 Coccyx Medial;Mid Stage 2 -  Partial thickness loss of dermis presenting as a shallow open injury with a red, pink wound bed without slough. (Active)  07/31/23 1418  Location: Coccyx  Location Orientation: Medial;Mid  Staging: Stage 2 -  Partial thickness loss of dermis presenting as a shallow open injury with a red, pink  wound bed without slough.  Wound Description (Comments):   Present on Admission: Yes  Dressing Type Foam - Lift dressing to assess site every shift 08/01/23 2330     DVT prophylaxis: Eliquis Family Communication:none Status is: Inpatient Remains inpatient appropriate because: DKA and electrolyte imbalance.    Code Status:     Code Status Orders  (From admission, onward)           Start     Ordered   07/31/23 1201  Full code  Continuous       Question:  By:  Answer:  Consent: discussion documented in EHR   07/31/23 1200           Code Status History     Date Active Date Inactive Code Status Order ID Comments User Context   05/02/2022 1945 05/04/2022 1905 Full Code [id number]  Teddy Spike, DO Inpatient   02/05/2022 2022 02/08/2022 1835 Full Code [id number]  Jacques Navy, MD ED   01/25/2022 1615 01/27/2022 2100 Full Code [id number]  Orland Mustard, MD ED   09/01/2021 0110 09/04/2021 2111 Full Code [id number]  Chotiner, Claudean Severance, MD Inpatient   03/13/2021 2044 03/16/2021 1756 Full Code [id number]  Rometta Emery, MD ED   03/23/2020 0024 03/23/2020 2227 Full Code [id number]  Liborio Nixon, MD ED   06/07/2017 2211 06/09/2017 1456 Full Code [id number]  Eduard Clos, MD ED         IV Access:   Peripheral IV   Procedures and diagnostic studies:   VAS [place] UPPER EXTREMITY VENOUS DUPLEX  Result Date: 08/01/2023 UPPER VENOUS STUDY  Patient Name:  ANGELINNA BRUUN  Date of Exam:   08/01/2023 Medical Rec #: [id number]  [facility] PROGRESS NOTE    Progress Note  Thania Dewaele  ZOX:[id number] DOB: 11-18-1934 DOA: 07/31/2023 PCP: [name], MD     Brief Narrative:   Davona Arbelaez is an 88 y.o. 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 Anion gap has closed bicarb greater than 20. She was transition to long-acting insulin plus sliding scale, go ahead and change her sliding scale to moderate with meal coverage.  Paroxysmal atrial fibrillation: Continue amiodarone and apixaban.  Acute kidney injury on chronic kidney disease stage IIIb: Likely prerenal azotemia from DKA. Was started on IV fluids her creatinine has returned to baseline.  Postablative hypothyroidism: Continue Synthroid.  Abnormal LFTs/bilirubin: Likely due to acute DKA, now improving.  Hypophosphatemia: Try to keep greater than 3.  Hypokalemia: Replete orally try to keep greater than 4.  Normocytic anemia: Follow-up PCP as an outpatient.  History of recurrent UTIs: Continue to measure them daily. Can resume Macrobid as an outpatient.  Generalized weakness: There are Derica. PT evaluated the patient recommended home health PT.  Hypoalbuminemia/moderate protein caloric malnutrition: Follow-up with PCP as an outpatient.  Stage II sacral decubitus ulcer present on admission: RN Pressure Injury Documentation: Pressure Injury 07/31/23 Coccyx Medial;Mid Stage 2 -  Partial thickness loss of dermis presenting as a shallow open injury with a red, pink wound bed without slough. (Active)  07/31/23 1418  Location: Coccyx  Location Orientation: Medial;Mid  Staging: Stage 2 -  Partial thickness loss of dermis presenting as a shallow open injury with a red, pink  wound bed without slough.  Wound Description (Comments):   Present on Admission: Yes  Dressing Type Foam - Lift dressing to assess site every shift 08/01/23 2330     DVT prophylaxis: Eliquis Family Communication:none Status is: Inpatient Remains inpatient appropriate because: DKA and electrolyte imbalance.    Code Status:     Code Status Orders  (From admission, onward)           Start     Ordered   07/31/23 1201  Full code  Continuous       Question:  By:  Answer:  Consent: discussion documented in EHR   07/31/23 1200           Code Status History     Date Active Date Inactive Code Status Order ID Comments User Context   05/02/2022 1945 05/04/2022 1905 Full Code [id number]  Teddy Spike, DO Inpatient   02/05/2022 2022 02/08/2022 1835 Full Code [id number]  Jacques Navy, MD ED   01/25/2022 1615 01/27/2022 2100 Full Code [id number]  Orland Mustard, MD ED   09/01/2021 0110 09/04/2021 2111 Full Code [id number]  Chotiner, Claudean Severance, MD Inpatient   03/13/2021 2044 03/16/2021 1756 Full Code [id number]  Rometta Emery, MD ED   03/23/2020 0024 03/23/2020 2227 Full Code [id number]  Liborio Nixon, MD ED   06/07/2017 2211 06/09/2017 1456 Full Code [id number]  Eduard Clos, MD ED         IV Access:   Peripheral IV   Procedures and diagnostic studies:   VAS [place] UPPER EXTREMITY VENOUS DUPLEX  Result Date: 08/01/2023 UPPER VENOUS STUDY  Patient Name:  ANGELINNA BRUUN  Date of Exam:   08/01/2023 Medical Rec #: [id number]  [facility] PROGRESS NOTE    Progress Note  Thania Dewaele  ZOX:[id number] DOB: 11-18-1934 DOA: 07/31/2023 PCP: [name], MD     Brief Narrative:   Davona Arbelaez is an 88 y.o. 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 Anion gap has closed bicarb greater than 20. She was transition to long-acting insulin plus sliding scale, go ahead and change her sliding scale to moderate with meal coverage.  Paroxysmal atrial fibrillation: Continue amiodarone and apixaban.  Acute kidney injury on chronic kidney disease stage IIIb: Likely prerenal azotemia from DKA. Was started on IV fluids her creatinine has returned to baseline.  Postablative hypothyroidism: Continue Synthroid.  Abnormal LFTs/bilirubin: Likely due to acute DKA, now improving.  Hypophosphatemia: Try to keep greater than 3.  Hypokalemia: Replete orally try to keep greater than 4.  Normocytic anemia: Follow-up PCP as an outpatient.  History of recurrent UTIs: Continue to measure them daily. Can resume Macrobid as an outpatient.  Generalized weakness: There are Derica. PT evaluated the patient recommended home health PT.  Hypoalbuminemia/moderate protein caloric malnutrition: Follow-up with PCP as an outpatient.  Stage II sacral decubitus ulcer present on admission: RN Pressure Injury Documentation: Pressure Injury 07/31/23 Coccyx Medial;Mid Stage 2 -  Partial thickness loss of dermis presenting as a shallow open injury with a red, pink wound bed without slough. (Active)  07/31/23 1418  Location: Coccyx  Location Orientation: Medial;Mid  Staging: Stage 2 -  Partial thickness loss of dermis presenting as a shallow open injury with a red, pink  wound bed without slough.  Wound Description (Comments):   Present on Admission: Yes  Dressing Type Foam - Lift dressing to assess site every shift 08/01/23 2330     DVT prophylaxis: Eliquis Family Communication:none Status is: Inpatient Remains inpatient appropriate because: DKA and electrolyte imbalance.    Code Status:     Code Status Orders  (From admission, onward)           Start     Ordered   07/31/23 1201  Full code  Continuous       Question:  By:  Answer:  Consent: discussion documented in EHR   07/31/23 1200           Code Status History     Date Active Date Inactive Code Status Order ID Comments User Context   05/02/2022 1945 05/04/2022 1905 Full Code [id number]  Teddy Spike, DO Inpatient   02/05/2022 2022 02/08/2022 1835 Full Code [id number]  Jacques Navy, MD ED   01/25/2022 1615 01/27/2022 2100 Full Code [id number]  Orland Mustard, MD ED   09/01/2021 0110 09/04/2021 2111 Full Code [id number]  Chotiner, Claudean Severance, MD Inpatient   03/13/2021 2044 03/16/2021 1756 Full Code [id number]  Rometta Emery, MD ED   03/23/2020 0024 03/23/2020 2227 Full Code [id number]  Liborio Nixon, MD ED   06/07/2017 2211 06/09/2017 1456 Full Code [id number]  Eduard Clos, MD ED         IV Access:   Peripheral IV   Procedures and diagnostic studies:   VAS [place] UPPER EXTREMITY VENOUS DUPLEX  Result Date: 08/01/2023 UPPER VENOUS STUDY  Patient Name:  ANGELINNA BRUUN  Date of Exam:   08/01/2023 Medical Rec #: [id number]  Accession #:    [id number] Date of Birth: [birth year]     Patient Gender: F Patient Age:   66 years Exam Location:  [facility] Procedure:      VAS [place] UPPER EXTREMITY VENOUS DUPLEX Referring Phys: Marguerita Merles --------------------------------------------------------------------------------  Indications: Swelling Risk Factors: Immobility past pregnancy. Comparison Study: No prior study Performing Technologist: Shona Simpson  Examination Guidelines: A  complete evaluation includes B-mode imaging, spectral Doppler, color Doppler, and power Doppler as needed of all accessible portions of each vessel. Bilateral testing is considered an integral part of a complete examination. Limited examinations for reoccurring indications may be performed as noted.  Right Findings: +----------+------------+---------+-----------+----------+-------+ RIGHT     CompressiblePhasicitySpontaneousPropertiesSummary +----------+------------+---------+-----------+----------+-------+ IJV           Full       Yes       Yes                      +----------+------------+---------+-----------+----------+-------+ Subclavian    Full       Yes       Yes                      +----------+------------+---------+-----------+----------+-------+ Axillary      Full       Yes       Yes                      +----------+------------+---------+-----------+----------+-------+ Brachial      Full       Yes       Yes                      +----------+------------+---------+-----------+----------+-------+ Radial        Full       Yes       Yes                      +----------+------------+---------+-----------+----------+-------+ Ulnar         Full       Yes       Yes                      +----------+------------+---------+-----------+----------+-------+ Cephalic      Full       Yes       Yes                      +----------+------------+---------+-----------+----------+-------+ Basilic       Full       Yes       Yes                      +----------+------------+---------+-----------+----------+-------+  Left Findings: +----------+------------+---------+-----------+----------+-------+ LEFT      CompressiblePhasicitySpontaneousPropertiesSummary +----------+------------+---------+-----------+----------+-------+ IJV           Full       Yes       Yes                      +----------+------------+---------+-----------+----------+-------+  Subclavian    Full       Yes       Yes                      +----------+------------+---------+-----------+----------+-------+ Axillary      Full       Yes       Yes                      +----------+------------+---------+-----------+----------+-------+  [facility] PROGRESS NOTE    Progress Note  Thania Dewaele  ZOX:[id number] DOB: 11-18-1934 DOA: 07/31/2023 PCP: [name], MD     Brief Narrative:   Davona Arbelaez is an 88 y.o. 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 Anion gap has closed bicarb greater than 20. She was transition to long-acting insulin plus sliding scale, go ahead and change her sliding scale to moderate with meal coverage.  Paroxysmal atrial fibrillation: Continue amiodarone and apixaban.  Acute kidney injury on chronic kidney disease stage IIIb: Likely prerenal azotemia from DKA. Was started on IV fluids her creatinine has returned to baseline.  Postablative hypothyroidism: Continue Synthroid.  Abnormal LFTs/bilirubin: Likely due to acute DKA, now improving.  Hypophosphatemia: Try to keep greater than 3.  Hypokalemia: Replete orally try to keep greater than 4.  Normocytic anemia: Follow-up PCP as an outpatient.  History of recurrent UTIs: Continue to measure them daily. Can resume Macrobid as an outpatient.  Generalized weakness: There are Derica. PT evaluated the patient recommended home health PT.  Hypoalbuminemia/moderate protein caloric malnutrition: Follow-up with PCP as an outpatient.  Stage II sacral decubitus ulcer present on admission: RN Pressure Injury Documentation: Pressure Injury 07/31/23 Coccyx Medial;Mid Stage 2 -  Partial thickness loss of dermis presenting as a shallow open injury with a red, pink wound bed without slough. (Active)  07/31/23 1418  Location: Coccyx  Location Orientation: Medial;Mid  Staging: Stage 2 -  Partial thickness loss of dermis presenting as a shallow open injury with a red, pink  wound bed without slough.  Wound Description (Comments):   Present on Admission: Yes  Dressing Type Foam - Lift dressing to assess site every shift 08/01/23 2330     DVT prophylaxis: Eliquis Family Communication:none Status is: Inpatient Remains inpatient appropriate because: DKA and electrolyte imbalance.    Code Status:     Code Status Orders  (From admission, onward)           Start     Ordered   07/31/23 1201  Full code  Continuous       Question:  By:  Answer:  Consent: discussion documented in EHR   07/31/23 1200           Code Status History     Date Active Date Inactive Code Status Order ID Comments User Context   05/02/2022 1945 05/04/2022 1905 Full Code [id number]  Teddy Spike, DO Inpatient   02/05/2022 2022 02/08/2022 1835 Full Code [id number]  Jacques Navy, MD ED   01/25/2022 1615 01/27/2022 2100 Full Code [id number]  Orland Mustard, MD ED   09/01/2021 0110 09/04/2021 2111 Full Code [id number]  Chotiner, Claudean Severance, MD Inpatient   03/13/2021 2044 03/16/2021 1756 Full Code [id number]  Rometta Emery, MD ED   03/23/2020 0024 03/23/2020 2227 Full Code [id number]  Liborio Nixon, MD ED   06/07/2017 2211 06/09/2017 1456 Full Code [id number]  Eduard Clos, MD ED         IV Access:   Peripheral IV   Procedures and diagnostic studies:   VAS [place] UPPER EXTREMITY VENOUS DUPLEX  Result Date: 08/01/2023 UPPER VENOUS STUDY  Patient Name:  ANGELINNA BRUUN  Date of Exam:   08/01/2023 Medical Rec #: [id number]

## 2023-08-04 DIAGNOSIS — E1010 Type 1 diabetes mellitus with ketoacidosis without coma: Secondary | ICD-10-CM | POA: Diagnosis not present

## 2023-08-04 LAB — GLUCOSE, CAPILLARY
Glucose-Capillary: 178 mg/dL — ABNORMAL HIGH (ref 70–99)
Glucose-Capillary: 210 mg/dL — ABNORMAL HIGH (ref 70–99)

## 2023-08-04 MED ORDER — NOVOLOG FLEXPEN 100 UNIT/ML ~~LOC~~ SOPN: 8.0000 [IU] | PEN_INJECTOR | Freq: Three times a day (TID) | SUBCUTANEOUS | 0 refills | Status: DC

## 2023-08-04 MED ORDER — TOUJEO MAX SOLOSTAR 300 UNIT/ML ~~LOC~~ SOPN: 14.0000 [IU] | PEN_INJECTOR | Freq: Every evening | SUBCUTANEOUS | 0 refills | Status: DC

## 2023-08-04 MED ORDER — DILTIAZEM HCL ER COATED BEADS 180 MG PO CP24: 180.0000 mg | ORAL_CAPSULE | Freq: Every day | ORAL | 1 refills | Status: DC

## 2023-08-04 NOTE — TOC Initial Note (Signed)
Transition of Care [facility]) - Initial/Assessment Note    Patient Details  Name: [name] MRN: [id number] Date of Birth: June 06, 1935  [facility] CM/SW Contact:    Harriett Sine, RN Phone Number: 08/04/2023, 2:27 PM  Clinical Narrative:                 Pt from hotel, pt d/c home with HHPT. Spoke with pt at bedside and daughter [name] on the phone about  DME, HHPT, and d/c plans. Pt states she is ready to go and she doesn't understand what is happening here. This  NCM explained to pt and [name] the process of [facility] calling pt to schedule best time and day for services.  It was also explained to both that the information was in her AVS packet. Transportation was arranged by family.    Expected Discharge Plan: Home w Home Health Services Barriers to Discharge: No Barriers Identified   Patient Goals and CMS Choice Patient states their goals for this hospitalization and ongoing recovery are:: to understand what is happening CMS Medicare.gov Compare Post Acute Care list provided to:: Other (Comment Required) (daughter [name]) Choice offered to / list presented to : Patient [place] ownership interest in [facility].provided to::  (NA)    Expected Discharge Plan and Services In-house Referral: NA Discharge Planning Services: CM Consult Post Acute Care Choice: Home Health Living arrangements for the past 2 months: Single Family Home Expected Discharge Date: 08/04/23               DME Arranged: Bedside commode DME Agency: [employer] Date DME Agency Contacted: 08/02/23     HH Arranged: PT HH Agency: [facility] Date [facility] Agency Contacted: 08/03/23      Prior Living Arrangements/Services Living arrangements for the past 2 months: Single Family Home Lives with:: Self Patient language and need for interpreter reviewed:: Yes Do you feel safe going back to the place where you live?: Yes      Need for Family Participation in Patient Care: No  (Comment) Care giver support system in place?: Yes (comment) Current home services: DME (cane, riser for toilet) Criminal Activity/Legal Involvement Pertinent to Current Situation/Hospitalization: No - Comment as needed  Activities of Daily Living   ADL Screening (condition at time of admission) Independently performs ADLs?: Yes (appropriate for developmental age) Is the patient deaf or have difficulty hearing?: No Does the patient have difficulty seeing, even when wearing glasses/contacts?: No Does the patient have difficulty concentrating, remembering, or making decisions?: No  Permission Sought/Granted Permission sought to share information with : Case Manager, Family Supports Permission granted to share information with : Yes, Verbal Permission Granted  Share Information with NAME: Muse,Karen Daughter [phone]  Frankey Shown Granddaughter [phone]    Marley,Vincent Nephew   [phone]  Permission granted to share info w AGENCY: HH agencies        Emotional Assessment Appearance:: Appears stated age Attitude/Demeanor/Rapport: Gracious Affect (typically observed): Accepting Orientation: : Oriented to Self, Oriented to Place, Oriented to  Time, Oriented to Situation Alcohol / Substance Use: Not Applicable Psych Involvement: No (comment)  Admission diagnosis:  DKA (diabetic ketoacidosis) (HCC) [E11.10] Diabetic ketoacidosis without coma associated with type 1 diabetes mellitus (HCC) [E10.10] Patient Active Problem List   Diagnosis Date Noted   Malnutrition of moderate degree 08/02/2023   Pressure injury of skin 07/31/2023   Pseudohyponatremia 07/31/2023   History of recurrent UTIs 07/31/2023   Acquired hypercoagulable state (HCC) 04/06/2023  Fatty liver 04/06/2023   Malignant neoplasm of urinary bladder (HCC) 04/06/2023   Fall at home, subsequent encounter 05/03/2022   Unsteady gait 05/03/2022   Scalp laceration 05/03/2022   First degree AV  block 05/03/2022   Occult blood in stools    Rectal ulcer    Anemia 02/05/2022   Stage 3b chronic kidney disease (CKD) (HCC) 01/27/2022   Atrial fibrillation with rapid ventricular response (HCC) 01/25/2022   Elevated transaminase level 01/25/2022   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   Hypokalemia 09/02/2021   Acute renal failure superimposed on stage 3a chronic kidney disease (HCC) 09/01/2021   COVID-19 virus infection 03/13/2021   DKA (diabetic ketoacidosis) (HCC) 03/13/2021   Hyperglycemia 03/23/2020   LADA (latent autoimmune diabetes in adults), 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   Hypercholesterolemia 03/15/2018   Left hip pain 06/07/2017   Hypothyroidism 02/03/2015   Essential hypertension 07/10/2013   Uncontrolled type 2 diabetes mellitus with hyperglycemia, with long-term current use of insulin (HCC) 07/10/2013   Upper respiratory infection 07/10/2013   PAF (paroxysmal atrial fibrillation) (HCC) 12/26/2012   Postop check 04/13/2011   PCP:  [name], MD Pharmacy:   [facility] [phone] - [place], [place] - [address] AT [facility] [address] [place] [address] Phone: [phone] Fax: [phone]  [id number] - [place], [place] - [address] [place] [address] Phone: [phone] Fax: [phone]     Social Determinants of Health (SDOH) Social History: SDOH Screenings   Food Insecurity: No Food Insecurity (07/31/2023)  Housing: Low Risk  (07/31/2023)  Transportation Needs: No Transportation Needs (07/31/2023)  Utilities: Not At Risk (07/31/2023)  Depression (PHQ2-9): Low Risk  (11/30/2022)  Financial Resource Strain: Medium Risk (06/01/2023)  Tobacco Use: Medium Risk (07/31/2023)   SDOH Interventions:     Readmission Risk Interventions    08/03/2023    2:12 PM 08/02/2023    5:09 PM 01/27/2022   12:37 PM  Readmission Risk Prevention Plan  Transportation  Screening Complete Complete Complete  PCP or Specialist Appt within 3-5 Days   Complete  HRI or Home Care Consult   Complete  Social Work Consult for Recovery Care Planning/Counseling   Complete  Palliative Care Screening   Not Applicable  Medication Review Oceanographer) Complete Referral to Pharmacy Complete  PCP or Specialist appointment within 3-5 days of discharge Complete Complete   HRI or Home Care Consult Complete Complete   SW Recovery Care/Counseling Consult Complete Complete   Palliative Care Screening Not Applicable Not Applicable   Skilled Nursing Facility Not Applicable Not Applicable

## 2023-08-04 NOTE — Discharge Summary (Addendum)
Physician Discharge Summary  [name] NWG:[id number] DOB: 11-14-1934 DOA: 07/31/2023  PCP: Karen Brunette, MD  Admit date: 07/31/2023 Discharge date: 08/04/2023  Admitted From: Home Disposition:  Home  Recommendations for Outpatient Follow-up:  Follow up with PCP in 1-2 weeks Please obtain BMP/CBC in one week   Home Health:Yes Equipment/Devices:None  Discharge Condition:STable CODE STATUS:Full Diet recommendation: Heart Healthy   Brief/Interim Summary: 88 y.o. 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 Her anion gap closed bicarb greater than 20. She was transition to long-acting to complete sliding scale her blood glucose remained well-controlled. She will follow-up with ECP as an outpatient diabetes coordinator met with her.  Paroxysmal atrial fibrillation: Continue amiodarone, diltiazem and apixaban.  Acute kidney injury on chronic kidney disease stage IIIb: Likely prerenal azotemia resolved with IV fluid resuscitation.  Hypothyroidism: Post ablative continue Synthroid.  Abnormal LFTs: Likely due to DKA now resolved.  Hypophosphatemia. Repleted.  Hypokalemia: Repleted now resolved.  Normocytic anemia: Follow-up with PCP as an outpatient.  Generalized weakness: PT evaluated the patient, she will go home with home health  PT.  Hypoalbuminemia/moderate protein caloric malnutrition: Follow-up with PCP as an outpatient.  Significant results are stage II present on admission: Continue wound care instructions  Discharge Instructions  Discharge Instructions     Diet - low sodium heart healthy   Complete by: As directed    Discharge wound care:   Complete by: As directed    Per wound care instructions   Increase activity slowly   Complete by: As directed       Allergies as of 08/04/2023   No Known Allergies      Medication List     STOP taking these medications    Trulicity 0.75 MG/0.5ML Soaj Generic drug: Dulaglutide       TAKE these medications    acetaminophen 500 MG tablet Commonly known as: TYLENOL Take 500 mg by mouth every 6 (six) hours as needed for moderate pain (pain score 4-6).   amiodarone 100 MG tablet Commonly known as: PACERONE Take 1 tablet (100 mg total) by mouth daily.   diltiazem 180 MG 24 hr capsule Commonly known as: CARDIZEM CD Take 1 capsule (180 mg total) by mouth daily.   Eliquis 2.5 MG Tabs tablet Generic drug: apixaban TAKE 1 TABLET BY MOUTH TWICE A DAY   FIBER-CAPS PO Take 1 capsule by mouth daily as needed (constipation).   furosemide 20 MG tablet Commonly known as: LASIX Take 1 tablet (20 mg total) by mouth daily as needed for fluid or edema.   Klor-Con M20 20 MEQ tablet Generic drug: potassium chloride SA Take 20 mEq by mouth daily.   levothyroxine 112 MCG tablet Commonly known as: SYNTHROID Take 112 mcg by mouth daily before breakfast.   mirtazapine 30 MG tablet Commonly known as: REMERON Take 30 mg by mouth at bedtime.  nitrofurantoin (macrocrystal-monohydrate) 100 MG capsule Commonly known as: MACROBID Take 100 mg by mouth at bedtime.   NovoLOG FlexPen 100 UNIT/ML FlexPen Generic drug: insulin aspart Inject 8 Units into the skin 3 (three) times daily with meals. What changed:  how much to take when to take this reasons to  take this   pantoprazole 40 MG tablet Commonly known as: PROTONIX Take 1 tablet (40 mg total) by mouth daily at 6 (six) AM.   Tiadylt ER 180 MG 24 hr capsule Generic drug: diltiazem TAKE 1 CAPSULE BY MOUTH EVERY DAY   Toujeo Max SoloStar 300 UNIT/ML Solostar Pen Generic drug: insulin glargine (2 Unit Dial) Inject 14 Units into the skin at bedtime. What changed: how much to take   trimethoprim 100 MG tablet Commonly known as: TRIMPEX Take 100 mg by mouth daily.   vitamin C 100 MG tablet Take 100 mg by mouth as needed.               Durable Medical Equipment  (From admission, onward)           Start     Ordered   08/03/23 1613  For home use only DME Bedside commode  Once       Question:  Patient needs a bedside commode to treat with the following condition  Answer:  Osteoarthritis of left hip   08/03/23 1613              Discharge Care Instructions  (From admission, onward)           Start     Ordered   08/04/23 0000  Discharge wound care:       Comments: Per wound care instructions   08/04/23 0827            Follow-up Information     [facility] Follow up.   Why: A representative with [facility] will contact with you regarding DME: bedside commode. Contact information: [address] [place] [address] [phone]         [facility] Follow up.   Specialty: Home Health Services Why: A representative from [facility] will contact you within 24-48 hours after being discharge from the hospital, regarding your home health physical therapy and home health occupational therapy services. Contact information: [address] [place] [address] [phone]                No Known Allergies  Consultations: None   Procedures/Studies: VAS [place] UPPER EXTREMITY VENOUS DUPLEX  Result Date: 08/01/2023 UPPER VENOUS STUDY  Patient Name:  [name]  Date of Exam:   08/01/2023  Medical Rec #: [id number]     Accession #:    [id number] Date of Birth: Aug 17, 1935     Patient Gender: F Patient Age:   88 years Exam Location:  [facility] Procedure:      VAS [place] UPPER EXTREMITY VENOUS DUPLEX Referring Phys: Marguerita Merles --------------------------------------------------------------------------------  Indications: Swelling Risk Factors: Immobility past pregnancy. Comparison Study: No prior study Performing Technologist: Shona Simpson  Examination Guidelines: A complete evaluation includes B-mode imaging, spectral Doppler, color Doppler, and power Doppler as needed of all accessible portions of each vessel. Bilateral testing is considered an integral part of a complete examination. Limited examinations for reoccurring indications may be performed as noted.  Right Findings: +----------+------------+---------+-----------+----------+-------+ RIGHT     CompressiblePhasicitySpontaneousPropertiesSummary +----------+------------+---------+-----------+----------+-------+ IJV           Full  Yes       Yes                      +----------+------------+---------+-----------+----------+-------+ Subclavian    Full       Yes       Yes                      +----------+------------+---------+-----------+----------+-------+ Axillary      Full       Yes       Yes                      +----------+------------+---------+-----------+----------+-------+ Brachial      Full       Yes       Yes                      +----------+------------+---------+-----------+----------+-------+ Radial        Full       Yes       Yes                      +----------+------------+---------+-----------+----------+-------+ Ulnar         Full       Yes       Yes                      +----------+------------+---------+-----------+----------+-------+ Cephalic      Full       Yes       Yes                      +----------+------------+---------+-----------+----------+-------+  Basilic       Full       Yes       Yes                      +----------+------------+---------+-----------+----------+-------+  Left Findings: +----------+------------+---------+-----------+----------+-------+ LEFT      CompressiblePhasicitySpontaneousPropertiesSummary +----------+------------+---------+-----------+----------+-------+ IJV           Full       Yes       Yes                      +----------+------------+---------+-----------+----------+-------+ Subclavian    Full       Yes       Yes                      +----------+------------+---------+-----------+----------+-------+ Axillary      Full       Yes       Yes                      +----------+------------+---------+-----------+----------+-------+ Brachial      Full       Yes       Yes                      +----------+------------+---------+-----------+----------+-------+ Radial        Full       Yes       Yes                      +----------+------------+---------+-----------+----------+-------+ Ulnar         Full       Yes       Yes                      +----------+------------+---------+-----------+----------+-------+  Cephalic      Full       Yes       Yes                      +----------+------------+---------+-----------+----------+-------+ Basilic       Full       Yes       Yes                      +----------+------------+---------+-----------+----------+-------+  Summary:  Right: No evidence of deep vein thrombosis in the upper extremity. No evidence of superficial vein thrombosis in the upper extremity.  Left: No evidence of deep vein thrombosis in the upper extremity. No evidence of superficial vein thrombosis in the upper extremity.  *See table(s) above for measurements and observations.  Diagnosing physician: Gerarda Fraction Electronically signed by Gerarda Fraction on 08/01/2023 at 4:59:55 PM.    Final    DG Chest Port 1 View  Result Date: 07/31/2023 CLINICAL DATA:   Hyperglycemia.  Weakness.  Vomiting. EXAM: PORTABLE CHEST 1 VIEW COMPARISON:  05/25/2023. FINDINGS: Low lung volume. Mildly increased interstitial markings are likely accentuated by low lung volume. No frank pulmonary edema. Bilateral lung fields are clear. No acute consolidation or lung collapse. Bilateral costophrenic angles are clear. Normal cardio-mediastinal silhouette. No acute osseous abnormalities. The soft tissues are within normal limits. IMPRESSION: 1. Low lung volume. No active disease. Electronically Signed   By: Jules Schick M.D.   On: 07/31/2023 10:34   CT ABDOMEN PELVIS WO CONTRAST  Result Date: 07/13/2023 CLINICAL DATA:  Gross macroscopic hematuria. History of prior bladder tumor resection. Weakness with painless hematuria. EXAM: CT ABDOMEN AND PELVIS WITHOUT CONTRAST TECHNIQUE: Multidetector CT imaging of the abdomen and pelvis was performed following the standard protocol without IV contrast. RADIATION DOSE REDUCTION: This exam was performed according to the departmental dose-optimization program which includes automated exposure control, adjustment of the mA and/or kV according to patient size and/or use of iterative reconstruction technique. COMPARISON:  01/10/2023 FINDINGS: Lower chest: Mild dependent atelectasis in the lung bases. Hepatobiliary: No focal liver abnormality is seen. Status post cholecystectomy. No biliary dilatation. Pancreas: Unremarkable. No pancreatic ductal dilatation or surrounding inflammatory changes. Spleen: Normal in size without focal abnormality. Adrenals/Urinary Tract: No adrenal gland nodules. Kidneys are symmetrical. No hydronephrosis or hydroureter. No renal or ureteral stones. Asymmetrical bladder wall thickening to the left with stranding anteriorly. This is similar to prior study and may represent residual or recurrent neoplasm or postoperative changes. Inflammatory process less likely. Stomach/Bowel: Stomach, small bowel, and colon are not abnormally  distended. No wall thickening or inflammatory changes are appreciated. Appendix is not identified. Vascular/Lymphatic: Aortic atherosclerosis. No enlarged abdominal or pelvic lymph nodes. Reproductive: Status post hysterectomy. No adnexal masses. Other: No free air or free fluid in the abdomen. Postoperative sutures in the low anterior pelvic wall. Musculoskeletal: Degenerative changes in the spine and hips. No acute bony abnormalities. IMPRESSION: 1. Persistent finding of asymmetric wall thickening in the left side of the bladder with stranding adjacent. This is likely postoperative but could indicate residual or recurrent neoplasm. 2. No renal or ureteral stone or obstruction. Electronically Signed   By: Burman Nieves M.D.   On: 07/13/2023 01:03   CT Head Wo Contrast  Result Date: 07/13/2023 CLINICAL DATA:  Bilateral leg weakness EXAM: CT HEAD WITHOUT CONTRAST TECHNIQUE: Contiguous axial images were obtained from the base of the skull through the vertex without intravenous contrast. RADIATION DOSE REDUCTION:  This exam was performed according to the departmental dose-optimization program which includes automated exposure control, adjustment of the mA and/or kV according to patient size and/or use of iterative reconstruction technique. COMPARISON:  04/30/2022 FINDINGS: Brain: No evidence of acute infarction, hemorrhage, hydrocephalus, extra-axial collection or mass lesion/mass effect. Mild atrophic changes are noted. Small right-sided subinsular lacunar infarct is noted and stable. Vascular: No hyperdense vessel or unexpected calcification. Skull: Normal. Negative for fracture or focal lesion. Sinuses/Orbits: No acute finding. Other: None. IMPRESSION: No acute intracranial abnormality noted. Electronically Signed   By: Alcide Clever M.D.   On: 07/13/2023 00:28   (Echo, Carotid, EGD, Colonoscopy, ERCP)    Subjective: No complaints feels great.  Discharge Exam: Vitals:   08/03/23 2005 08/04/23 0509   BP: (!) 118/54 (!) 135/58  Pulse: 72 70  Resp: 20 18  Temp: 98.7 F (37.1 C) 98.2 F (36.8 C)  SpO2: 100% 97%   Vitals:   08/03/23 1600 08/03/23 1653 08/03/23 2005 08/04/23 0509  BP: (!) 140/47 (!) 135/50 (!) 118/54 (!) 135/58  Pulse: 67 71 72 70  Resp: (!) 24 18 20 18   Temp:  98.1 F (36.7 C) 98.7 F (37.1 C) 98.2 F (36.8 C)  TempSrc:  Oral  Oral  SpO2: 97% 100% 100% 97%  Weight:      Height:        General: Pt is alert, awake, not in acute distress Cardiovascular: RRR, S1/S2 +, no rubs, no gallops Respiratory: CTA bilaterally, no wheezing, no rhonchi Abdominal: Soft, NT, ND, bowel sounds + Extremities: no edema, no cyanosis    The results of significant diagnostics from this hospitalization (including imaging, microbiology, ancillary and laboratory) are listed below for reference.     Microbiology: Recent Results (from the past 240 hour(s))  MRSA Next Gen by PCR, Nasal     Status: None   Collection Time: 07/31/23  1:03 PM   Specimen: Nasal Mucosa; Nasal Swab  Result Value Ref Range Status   MRSA by PCR Next Gen NOT DETECTED NOT DETECTED Final    Comment: (NOTE) The GeneXpert MRSA Assay (FDA approved for NASAL specimens only), is one component of a comprehensive MRSA colonization surveillance program. It is not intended to diagnose MRSA infection nor to guide or monitor treatment for MRSA infections. Test performance is not FDA approved in patients less than 9 years old. Performed at [facility], [address] [place], [place] [address]   Urine Culture (for pregnant, neutropenic or urologic patients or patients with an indwelling urinary catheter)     Status: Abnormal   Collection Time: 08/01/23  3:56 PM   Specimen: Urine, Clean Catch  Result Value Ref Range Status   Specimen Description   Final    URINE, CLEAN CATCH Performed at [facility], [address] [place], [place] [address]    Special Requests   Final     NONE Performed at [facility], [address] [place], [place] [address]    Culture (A)  Final    <10,000 COLONIES/mL INSIGNIFICANT GROWTH Performed at [facility], [address], [place], [place] [address]    Report Status 08/02/2023 FINAL  Final     Labs: BNP (last 3 results) No results for input(s): "BNP" in the last 8760 hours. Basic Metabolic Panel: Recent Labs  Lab 08/01/23 0705 08/01/23 1143 08/01/23 1854 08/02/23 0315 08/03/23 0318  NA 132* 137 135 135 134*  K 3.9 4.1 3.9 3.3*  3.6  CL 103 107 108 110 107  CO2 19* 18* 18* 18* 21*  GLUCOSE 148* 163* 163* 92 169*  BUN 21 19 15 13 13   CREATININE 1.18* 1.03* 0.95 0.91 0.86  CALCIUM 8.3* 8.5* 8.1* 8.0* 7.9*  MG  --  1.9 1.9 1.9 1.7  PHOS  --  2.2* 2.3* 3.0 2.6   Liver Function Tests: Recent Labs  Lab 08/01/23 0705 08/01/23 1143 08/01/23 1854 08/02/23 0315 08/03/23 0318  AST 292* 262* 238* 173* 70*  ALT 183* 176* 176* 150* 103*  ALKPHOS 88 80 87 84 82  BILITOT 0.6 0.7 0.6 0.5 0.4  PROT 5.4* 5.4* 5.8* 5.5* 5.0*  ALBUMIN 2.5* 2.5* 2.5* 2.4* 2.2*   Recent Labs  Lab 07/31/23 0930  LIPASE 18   No results for input(s): "AMMONIA" in the last 168 hours. CBC: Recent Labs  Lab 07/31/23 0930 07/31/23 1047 08/01/23 0152 08/01/23 1143 08/02/23 0315 08/03/23 0318  WBC 11.3*  --  12.8* 9.4 6.3 4.4  NEUTROABS  --   --   --  7.3 4.0 2.7  HGB 11.0* 13.3 9.3* 9.6* 10.1* 9.2*  HCT 36.6 39.0 28.9* 29.0* 31.8* 28.8*  MCV 98.7  --  90.3 87.1 91.9 91.4  PLT 247  --  178 170 171 160   Cardiac Enzymes: No results for input(s): "CKTOTAL", "CKMB", "CKMBINDEX", "TROPONINI" in the last 168 hours. BNP: Invalid input(s): "POCBNP" CBG: Recent Labs  Lab 08/03/23 1119 08/03/23 1606 08/03/23 1656 08/03/23 2126 08/04/23 0749  GLUCAP 221* 138* 152* 191* 178*   D-Dimer No results for input(s): "DDIMER" in the last 72 hours. Hgb A1c No results for input(s): "HGBA1C" in the last 72  hours. Lipid Profile No results for input(s): "CHOL", "HDL", "LDLCALC", "TRIG", "CHOLHDL", "LDLDIRECT" in the last 72 hours. Thyroid function studies No results for input(s): "TSH", "T4TOTAL", "T3FREE", "THYROIDAB" in the last 72 hours.  Invalid input(s): "FREET3" Anemia work up Recent Labs    08/03/23 0318  VITAMINB12 2,203*  FOLATE 7.2  FERRITIN 79  TIBC 175*  IRON 37  RETICCTPCT 1.8   Urinalysis    Component Value Date/Time   COLORURINE RED (A) 08/01/2023 1149   APPEARANCEUR HAZY (A) 08/01/2023 1149   LABSPEC 1.015 08/01/2023 1149   PHURINE 6.0 08/01/2023 1149   GLUCOSEU >=500 (A) 08/01/2023 1149   HGBUR LARGE (A) 08/01/2023 1149   BILIRUBINUR NEGATIVE 08/01/2023 1149   KETONESUR 5 (A) 08/01/2023 1149   PROTEINUR 100 (A) 08/01/2023 1149   UROBILINOGEN 0.2 03/17/2011 0847   NITRITE NEGATIVE 08/01/2023 1149   LEUKOCYTESUR TRACE (A) 08/01/2023 1149   Sepsis Labs Recent Labs  Lab 08/01/23 0152 08/01/23 1143 08/02/23 0315 08/03/23 0318  WBC 12.8* 9.4 6.3 4.4   Microbiology Recent Results (from the past 240 hour(s))  MRSA Next Gen by PCR, Nasal     Status: None   Collection Time: 07/31/23  1:03 PM   Specimen: Nasal Mucosa; Nasal Swab  Result Value Ref Range Status   MRSA by PCR Next Gen NOT DETECTED NOT DETECTED Final    Comment: (NOTE) The GeneXpert MRSA Assay (FDA approved for NASAL specimens only), is one component of a comprehensive MRSA colonization surveillance program. It is not intended to diagnose MRSA infection nor to guide or monitor treatment for MRSA infections. Test performance is not FDA approved in patients less than 93 years old. Performed at [facility], [address], [place], [place] [address]   Urine Culture (for pregnant, neutropenic or urologic patients  or patients with an indwelling urinary catheter)     Status: Abnormal   Collection Time: 08/01/23  3:56 PM   Specimen: Urine, Clean Catch  Result Value Ref Range  Status   Specimen Description   Final    URINE, CLEAN CATCH Performed at [facility], [address] [place], [place] [address]    Special Requests   Final    NONE Performed at [facility], [address] [place], [place] [address]    Culture (A)  Final    <10,000 COLONIES/mL INSIGNIFICANT GROWTH Performed at [facility], [address], [place], [place] [address]    Report Status 08/02/2023 FINAL  Final     Time coordinating discharge: Over 35 minutes  SIGNED:   Marinda Elk, MD  [facility] 08/04/2023, 8:28 AM Pager   If 7PM-7AM, please contact night-coverage [email or web address] Password [email or web address]

## 2023-08-04 NOTE — Care Management Important Message (Signed)
Important Message  Patient Details IM Letter given. Name: [name] MRN: [id number] Date of Birth: [birth year]   Important Message Given:  Yes - Medicare IM     [name] 08/04/2023, 9:07 AM

## 2023-08-04 NOTE — [place] FL2 (Deleted)
[place] MEDICAID FL2 LEVEL OF CARE FORM     IDENTIFICATION  Patient Name: [name] Birthdate: 1935/07/11 Sex: female Admission Date (Current Location): 07/31/2023  [facility] and [place] Number:  Producer, television/film/video and Address:  [facility],  [address], [place] [address]      Provider Number: [id number]  Attending Physician Name and Address:  David Stall, Darin Engels, MD  Relative Name and Phone Number:  Verlon Setting (Daughter)  [phone] [facility])    Current Level of Care: Hospital Recommended Level of Care: Skilled Nursing Facility Prior Approval Number:    Date Approved/Denied:   PASRR Number:    Discharge Plan: SNF    Current Diagnoses: Patient Active Problem List   Diagnosis Date Noted   Malnutrition of moderate degree 08/02/2023   Pressure injury of skin 07/31/2023   Pseudohyponatremia 07/31/2023   History of recurrent UTIs 07/31/2023   Acquired hypercoagulable state (HCC) 04/06/2023   Fatty liver 04/06/2023   Malignant neoplasm of urinary bladder (HCC) 04/06/2023   Fall at home, subsequent encounter 05/03/2022   Unsteady gait 05/03/2022   Scalp laceration 05/03/2022   First degree AV block 05/03/2022   Occult blood in stools    Rectal ulcer    Anemia 02/05/2022   Stage 3b chronic kidney disease (CKD) (HCC) 01/27/2022   Atrial fibrillation with rapid ventricular response (HCC) 01/25/2022   Elevated transaminase level 01/25/2022   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   Hypokalemia 09/02/2021   Acute renal failure superimposed on stage 3a chronic kidney disease (HCC) 09/01/2021   COVID-19 virus infection 03/13/2021   DKA (diabetic ketoacidosis) (HCC) 03/13/2021   Hyperglycemia 03/23/2020   LADA (latent autoimmune diabetes in adults), 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   Hypercholesterolemia 03/15/2018   Left hip pain 06/07/2017   Hypothyroidism 02/03/2015   Essential hypertension 07/10/2013   Uncontrolled type 2 diabetes mellitus  with hyperglycemia, with long-term current use of insulin (HCC) 07/10/2013   Upper respiratory infection 07/10/2013   PAF (paroxysmal atrial fibrillation) (HCC) 12/26/2012   Postop check 04/13/2011    Orientation RESPIRATION BLADDER Height & Weight     Self, Time, Situation, Place  Normal Continent Weight: 54.9 kg Height:  5\' 2"  (157.5 cm)  BEHAVIORAL SYMPTOMS/MOOD NEUROLOGICAL BOWEL NUTRITION STATUS      Continent Diet  AMBULATORY STATUS COMMUNICATION OF NEEDS Skin   Limited Assist Verbally Normal                       Personal Care Assistance Level of Assistance  Bathing, Feeding, Dressing, Total care Bathing Assistance: Limited assistance Feeding assistance: Limited assistance Dressing Assistance: Limited assistance Total Care Assistance: Limited assistance   Functional Limitations Info  Sight, Hearing, Speech Sight Info: Adequate Hearing Info: Adequate Speech Info: Adequate    SPECIAL CARE FACTORS FREQUENCY  PT (By licensed PT), OT (By licensed OT)     PT Frequency: 5x per week OT Frequency: 5X per week            Contractures Contractures Info: Not present    Additional Factors Info                  Current Medications (08/04/2023):  This is the current hospital active medication list Current Facility-Administered Medications  Medication Dose Route Frequency Provider Last Rate Last Admin   acetaminophen (TYLENOL) tablet 650 mg  650 mg Oral Q6H PRN Marinda Elk, MD       Or   acetaminophen (TYLENOL) suppository 650  mg  650 mg Rectal Q6H PRN Marinda Elk, MD       amiodarone (PACERONE) tablet 100 mg  100 mg Oral Daily Marinda Elk, MD   100 mg at 08/04/23 1056   Chlorhexidine Gluconate Cloth 2 % PADS 6 each  6 each Topical Daily Marinda Elk, MD   6 each at 08/04/23 1056   dextrose 50 % solution 0-50 mL  0-50 mL Intravenous PRN Marinda Elk, MD       diltiazem (CARDIZEM CD) 24 hr capsule 180 mg  180 mg Oral  Daily Marinda Elk, MD   180 mg at 08/04/23 1056   feeding supplement (GLUCERNA SHAKE) (GLUCERNA SHAKE) liquid 237 mL  237 mL Oral BID BM Marinda Elk, MD   237 mL at 08/04/23 1056   insulin aspart (novoLOG) injection 0-15 Units  0-15 Units Subcutaneous TID WC Marinda Elk, MD   3 Units at 08/04/23 4098   insulin aspart (novoLOG) injection 0-5 Units  0-5 Units Subcutaneous QHS Marinda Elk, MD       insulin aspart (novoLOG) injection 4 Units  4 Units Subcutaneous TID WC Marinda Elk, MD   4 Units at 08/04/23 1191   insulin glargine-yfgn (SEMGLEE) injection 11 Units  11 Units Subcutaneous Daily Marinda Elk, MD   11 Units at 08/04/23 1057   levothyroxine (SYNTHROID) tablet 112 mcg  112 mcg Oral Q0600 Marinda Elk, MD   112 mcg at 08/04/23 0502   mirtazapine (REMERON) tablet 30 mg  30 mg Oral QHS Marinda Elk, MD   30 mg at 08/03/23 2131   multivitamin with minerals tablet 1 tablet  1 tablet Oral Daily Marinda Elk, MD   1 tablet at 08/04/23 1056   ondansetron (ZOFRAN) tablet 4 mg  4 mg Oral Q6H PRN Marinda Elk, MD       Or   ondansetron [facility]) injection 4 mg  4 mg Intravenous Q6H PRN Marinda Elk, MD   4 mg at 07/31/23 1340   Oral care mouth rinse  15 mL Mouth Rinse PRN Marinda Elk, MD       pantoprazole (PROTONIX) EC tablet 40 mg  40 mg Oral Daily Marinda Elk, MD   40 mg at 08/04/23 1056   potassium chloride (KLOR-CON M) CR tablet 20 mEq  20 mEq Oral Daily Marinda Elk, MD   20 mEq at 08/04/23 1056   sodium bicarbonate tablet 650 mg  650 mg Oral BID Marinda Elk, MD   650 mg at 08/04/23 1056   sodium chloride flush (NS) 0.9 % injection 10 mL  10 mL Intravenous Q12H Marinda Elk, MD   10 mL at 08/04/23 1057     Discharge Medications: Please see discharge summary for a list of discharge medications.  Relevant Imaging Results:  Relevant Lab  Results:   Additional Information    Harriett Sine, RN

## 2023-08-08 ENCOUNTER — Telehealth: Payer: Self-pay

## 2023-08-08 NOTE — Patient Outreach (Signed)
Care Coordination   08/08/2023 Name: [name] MRN: [id number] DOB: [birth year]   Care Coordination Outreach Attempts:  An unsuccessful telephone outreach was attempted today to offer the patient information about available care coordination services.  Follow Up Plan:  Additional outreach attempts will be made to offer the patient care coordination information and services.   Encounter Outcome:  Patient Request to Call Back   Care Coordination Interventions:  No, not indicated    [name] Idelle Jo, RN, MSN [facility]  [facility], [facility] Management Community Coordinator Direct Dial: [phone]  Fax: [phone] Website: [email or web address]

## 2023-08-09 ENCOUNTER — Other Ambulatory Visit: Payer: Self-pay | Admitting: Internal Medicine

## 2023-08-09 DIAGNOSIS — M1990 Unspecified osteoarthritis, unspecified site: Secondary | ICD-10-CM | POA: Diagnosis not present

## 2023-08-09 DIAGNOSIS — N1832 Chronic kidney disease, stage 3b: Secondary | ICD-10-CM | POA: Diagnosis not present

## 2023-08-09 DIAGNOSIS — E039 Hypothyroidism, unspecified: Secondary | ICD-10-CM | POA: Diagnosis not present

## 2023-08-09 DIAGNOSIS — E1022 Type 1 diabetes mellitus with diabetic chronic kidney disease: Secondary | ICD-10-CM | POA: Diagnosis not present

## 2023-08-09 DIAGNOSIS — M25551 Pain in right hip: Secondary | ICD-10-CM | POA: Diagnosis not present

## 2023-08-09 DIAGNOSIS — Z8551 Personal history of malignant neoplasm of bladder: Secondary | ICD-10-CM | POA: Diagnosis not present

## 2023-08-09 DIAGNOSIS — E139 Other specified diabetes mellitus without complications: Secondary | ICD-10-CM | POA: Diagnosis not present

## 2023-08-09 DIAGNOSIS — R600 Localized edema: Secondary | ICD-10-CM | POA: Diagnosis not present

## 2023-08-09 DIAGNOSIS — I480 Paroxysmal atrial fibrillation: Secondary | ICD-10-CM | POA: Diagnosis not present

## 2023-08-09 DIAGNOSIS — E785 Hyperlipidemia, unspecified: Secondary | ICD-10-CM | POA: Diagnosis not present

## 2023-08-09 DIAGNOSIS — N179 Acute kidney failure, unspecified: Secondary | ICD-10-CM | POA: Diagnosis not present

## 2023-08-09 DIAGNOSIS — Z09 Encounter for follow-up examination after completed treatment for conditions other than malignant neoplasm: Secondary | ICD-10-CM | POA: Diagnosis not present

## 2023-08-09 DIAGNOSIS — I998 Other disorder of circulatory system: Secondary | ICD-10-CM | POA: Diagnosis not present

## 2023-08-09 DIAGNOSIS — D631 Anemia in chronic kidney disease: Secondary | ICD-10-CM | POA: Diagnosis not present

## 2023-08-09 DIAGNOSIS — I130 Hypertensive heart and chronic kidney disease with heart failure and stage 1 through stage 4 chronic kidney disease, or unspecified chronic kidney disease: Secondary | ICD-10-CM | POA: Diagnosis not present

## 2023-08-09 DIAGNOSIS — E440 Moderate protein-calorie malnutrition: Secondary | ICD-10-CM | POA: Diagnosis not present

## 2023-08-09 DIAGNOSIS — I509 Heart failure, unspecified: Secondary | ICD-10-CM | POA: Diagnosis not present

## 2023-08-09 NOTE — Patient Instructions (Addendum)
SURGICAL WAITING ROOM VISITATION  Patients having surgery or a procedure may have no more than 2 support people in the waiting area - these visitors may rotate.    Children under the age of 11 must have an adult with them who is not the patient.  Due to an increase in RSV and influenza rates and associated hospitalizations, children ages 4 and under may not visit patients in [facility] hospitals.  If the patient needs to stay at the hospital during part of their recovery, the visitor guidelines for inpatient rooms apply. Pre-op nurse will coordinate an appropriate time for 1 support person to accompany patient in pre-op.  This support person may not rotate.    Please refer to the [facility] website for the visitor guidelines for Inpatients (after your surgery is over and you are in a regular room).       Your procedure is scheduled on: 08/12/23   Report to [facility] Main Entrance    Report to admitting at  11 AM   Call this number if you have problems the morning of surgery [phone]   Do not eat any food or drink liquids after midnight  FOLLOW BOWEL PREP AND ANY ADDITIONAL PRE OP INSTRUCTIONS YOU RECEIVED FROM YOUR SURGEON'S OFFICE!!!     Oral Hygiene is also important to reduce your risk of infection.                                    Remember - BRUSH YOUR TEETH THE MORNING OF SURGERY WITH YOUR REGULAR TOOTHPASTE  DENTURES WILL BE REMOVED PRIOR TO SURGERY PLEASE DO NOT APPLY "Poly grip" OR ADHESIVES!!!   Stop all vitamins and herbal supplements 7 days before surgery.   Take these medicines the morning of surgery with A SIP OF WATER: Amiodarone, Cardizem, Synthroid, Pantoprazole,Tiadult  DO NOT TAKE ANY ORAL DIABETIC MEDICATIONS DAY OF YOUR SURGERY Hold Trulicity the day of surgery Do not take bedtime dose of Novolog the night before surgery. If CBG is >220 may take 1/2 of morning dose of Novolog insulin. Take only 80% of [place]jeo insulin the night before  surgery=      units             You may not have any metal on your body including hair pins, jewelry, and body piercing             Do not wear make-up, lotions, powders, perfumes/cologne, or deodorant  Do not wear nail polish including gel and S&S, artificial/acrylic nails, or any other type of covering on natural nails including finger and toenails. If you have artificial nails, gel coating, etc. that needs to be removed by a nail salon please have this removed prior to surgery or surgery may need to be canceled/ delayed if the surgeon/ anesthesia feels like they are unable to be safely monitored.   Do not shave  48 hours prior to surgery.    Do not bring valuables to the hospital. [facility] IS NOT             RESPONSIBLE   FOR VALUABLES.   Contacts, glasses, dentures or bridgework may not be worn into surgery.  DO NOT BRING YOUR HOME MEDICATIONS TO THE HOSPITAL. PHARMACY WILL DISPENSE MEDICATIONS LISTED ON YOUR MEDICATION LIST TO YOU DURING YOUR ADMISSION IN THE HOSPITAL!    Patients discharged on the day of surgery will not  be allowed to drive home.  Someone NEEDS to stay with you for the first 24 hours after anesthesia.   Special Instructions: Bring a copy of your healthcare power of attorney and living will documents the day of surgery if you haven't scanned them before.              Please read over the following fact sheets you were given: IF YOU HAVE QUESTIONS ABOUT YOUR PRE-OP INSTRUCTIONS PLEASE CALL [phone] [name]   If you received a COVID test during your pre-op visit  it is requested that you wear a mask when out in public, stay away from anyone that may not be feeling well and notify your surgeon if you develop symptoms. If you test positive for Covid or have been in contact with anyone that has tested positive in the last 10 days please notify you surgeon.     - Preparing for Surgery Before surgery, you can play an important role.  Because skin is not  sterile, your skin needs to be as free of germs as possible.  You can reduce the number of germs on your skin by washing with CHG (chlorahexidine gluconate) soap before surgery.  CHG is an antiseptic cleaner which kills germs and bonds with the skin to continue killing germs even after washing. Please DO NOT use if you have an allergy to CHG or antibacterial soaps.  If your skin becomes reddened/irritated stop using the CHG and inform your nurse when you arrive at Short Stay. Do not shave (including legs and underarms) for at least 48 hours prior to the first CHG shower.  You may shave your face/neck.  Please follow these instructions carefully:  1.  Shower with CHG Soap the night before surgery and the  morning of surgery.  2.  If you choose to wash your hair, wash your hair first as usual with your normal  shampoo.  3.  After you shampoo, rinse your hair and body thoroughly to remove the shampoo.                             4.  Use CHG as you would any other liquid soap.  You can apply chg directly to the skin and wash.  Gently with a scrungie or clean washcloth.  5.  Apply the CHG Soap to your body ONLY FROM THE NECK DOWN.   Do   not use on face/ open                           Wound or open sores. Avoid contact with eyes, ears mouth and   genitals (private parts).                       Wash face,  Genitals (private parts) with your normal soap.             6.  Wash thoroughly, paying special attention to the area where your    surgery  will be performed.  7.  Thoroughly rinse your body with warm water from the neck down.  8.  DO NOT shower/wash with your normal soap after using and rinsing off the CHG Soap.                9.  Pat yourself dry with a clean towel.  10.  Wear clean pajamas.            11.  Place clean sheets on your bed the night of your first shower and do not  sleep with pets. Day of Surgery : Do not apply any lotions/deodorants the morning of surgery.  Please wear  clean clothes to the hospital/surgery center.  FAILURE TO FOLLOW THESE INSTRUCTIONS MAY RESULT IN THE CANCELLATION OF YOUR SURGERY  PATIENT SIGNATURE_________________________________  NURSE SIGNATURE__________________________________  ________________________________________________________________________

## 2023-08-09 NOTE — Progress Notes (Addendum)
COVID Vaccine received:  []  No [x]  Yes Date of any COVID positive Test in last 90 days: no PCP - [name] MD Cardiologist - Zoila Shutter MD  Chest x-ray - 07/31/23 EKG -  08/05/23 Stress Test -  ECHO - 09/01/21 Epic Cardiac Cath -   Bowel Prep - [x]  No  []   Yes ______  Pacemaker / ICD device [x]  No []  Yes   Spinal Cord Stimulator:[x]  No []  Yes       History of Sleep Apnea? [x]  No []  Yes   CPAP used?- [x]  No []  Yes    Does the patient monitor blood sugar?          []  No []  Yes  []  N/A  Patient has: []  NO Hx DM   []  Pre-DM                 [x]  DM1  []   DM2 Does patient have a [employer] or Dexacom? []  No [x]  Yes   Fasting Blood Sugar Ranges- 250 Checks Blood Sugar ___5__ times a day  GLP1 agonist / usual dose - no GLP1 instructions:  SGLT-2 inhibitors / usual dose - no SGLT-2 instructions:   Blood Thinner / Instructions:Eliquis- Stop 2 days before surgery per MD office. Aspirin Instructions:no  Comments:   Activity level: Patient is able  to climb a flight of stairs without difficulty; [x]  No CP  [x]  No SOB,___   Patient can  perform ADLs without assistance.   Anesthesia review: DM1,Murmur, HTN, Afib, ED for DKA 07/31/23. CBG at PST visit 438  Patient denies shortness of breath, fever, cough and chest pain at PAT appointment.  Patient verbalized understanding and agreement to the Pre-Surgical Instructions that were given to them at this PAT appointment. Patient was also educated of the need to review these PAT instructions again prior to his/her surgery.I reviewed the appropriate phone numbers to call if they have any and questions or concerns.

## 2023-08-10 ENCOUNTER — Other Ambulatory Visit: Payer: Self-pay

## 2023-08-10 ENCOUNTER — Encounter (HOSPITAL_COMMUNITY)
Admission: RE | Admit: 2023-08-10 | Discharge: 2023-08-10 | Disposition: A | Discharge status: Home / Self Care | Payer: Medicare PPO | Source: Ambulatory Visit | Attending: Urology | Admitting: Urology

## 2023-08-10 VITALS — BP 145/59 | HR 79 | Temp 98.4°F | Resp 16 | Ht 62.0 in | Wt 125.0 lb

## 2023-08-10 DIAGNOSIS — N1832 Chronic kidney disease, stage 3b: Secondary | ICD-10-CM | POA: Diagnosis not present

## 2023-08-10 DIAGNOSIS — E109 Type 1 diabetes mellitus without complications: Secondary | ICD-10-CM

## 2023-08-10 DIAGNOSIS — Z87891 Personal history of nicotine dependence: Secondary | ICD-10-CM | POA: Insufficient documentation

## 2023-08-10 DIAGNOSIS — E039 Hypothyroidism, unspecified: Secondary | ICD-10-CM | POA: Insufficient documentation

## 2023-08-10 DIAGNOSIS — D494 Neoplasm of unspecified behavior of bladder: Secondary | ICD-10-CM | POA: Insufficient documentation

## 2023-08-10 DIAGNOSIS — I129 Hypertensive chronic kidney disease with stage 1 through stage 4 chronic kidney disease, or unspecified chronic kidney disease: Secondary | ICD-10-CM | POA: Insufficient documentation

## 2023-08-10 DIAGNOSIS — E1022 Type 1 diabetes mellitus with diabetic chronic kidney disease: Secondary | ICD-10-CM | POA: Insufficient documentation

## 2023-08-10 DIAGNOSIS — Z794 Long term (current) use of insulin: Secondary | ICD-10-CM | POA: Insufficient documentation

## 2023-08-10 DIAGNOSIS — Z01812 Encounter for preprocedural laboratory examination: Secondary | ICD-10-CM | POA: Insufficient documentation

## 2023-08-10 DIAGNOSIS — E119 Type 2 diabetes mellitus without complications: Secondary | ICD-10-CM

## 2023-08-10 DIAGNOSIS — I4891 Unspecified atrial fibrillation: Secondary | ICD-10-CM | POA: Diagnosis not present

## 2023-08-10 LAB — BASIC METABOLIC PANEL
Anion gap: 12 (ref 5–15)
BUN: 13 mg/dL (ref 8–23)
CO2: 21 mmol/L — ABNORMAL LOW (ref 22–32)
Calcium: 8.5 mg/dL — ABNORMAL LOW (ref 8.9–10.3)
Chloride: 97 mmol/L — ABNORMAL LOW (ref 98–111)
Creatinine, Ser: 0.85 mg/dL (ref 0.44–1.00)
GFR, Estimated: >60 mL/min (ref >60)
Glucose, Bld: 466 mg/dL — ABNORMAL HIGH (ref 70–99)
Potassium: 4.5 mmol/L (ref 3.5–5.1)
Sodium: 130 mmol/L — ABNORMAL LOW (ref 135–145)

## 2023-08-10 LAB — CBC
HCT: 33.4 % — ABNORMAL LOW (ref 36.0–46.0)
Hemoglobin: 10.4 g/dL — ABNORMAL LOW (ref 12.0–15.0)
MCH: 29 pg (ref 26.0–34.0)
MCHC: 31.1 g/dL (ref 30.0–36.0)
MCV: 93 fL (ref 80.0–100.0)
Platelets: 325 10*3/uL (ref 150–400)
RBC: 3.59 MIL/uL — ABNORMAL LOW (ref 3.87–5.11)
RDW: 15.1 % (ref 11.5–15.5)
WBC: 6.3 10*3/uL (ref 4.0–10.5)
nRBC: 0 % (ref 0.0–0.2)

## 2023-08-10 LAB — GLUCOSE, CAPILLARY: Glucose-Capillary: 438 mg/dL — ABNORMAL HIGH (ref 70–99)

## 2023-08-10 LAB — HEMOGLOBIN A1C
Hgb A1c MFr Bld: 10.1 % — ABNORMAL HIGH (ref 4.8–5.6)
Mean Plasma Glucose: 243.17 mg/dL

## 2023-08-10 NOTE — Progress Notes (Signed)
Pt's CBG was 438. Denied feeling bad in any way. She gave herself sliding scale insulin. Antionette Poles PA Anesthesia notified. No new orders.

## 2023-08-11 ENCOUNTER — Telehealth: Payer: Self-pay

## 2023-08-11 DIAGNOSIS — E1022 Type 1 diabetes mellitus with diabetic chronic kidney disease: Secondary | ICD-10-CM | POA: Diagnosis not present

## 2023-08-11 DIAGNOSIS — N179 Acute kidney failure, unspecified: Secondary | ICD-10-CM | POA: Diagnosis not present

## 2023-08-11 DIAGNOSIS — E440 Moderate protein-calorie malnutrition: Secondary | ICD-10-CM | POA: Diagnosis not present

## 2023-08-11 DIAGNOSIS — I480 Paroxysmal atrial fibrillation: Secondary | ICD-10-CM | POA: Diagnosis not present

## 2023-08-11 DIAGNOSIS — D631 Anemia in chronic kidney disease: Secondary | ICD-10-CM | POA: Diagnosis not present

## 2023-08-11 DIAGNOSIS — I509 Heart failure, unspecified: Secondary | ICD-10-CM | POA: Diagnosis not present

## 2023-08-11 DIAGNOSIS — N1832 Chronic kidney disease, stage 3b: Secondary | ICD-10-CM | POA: Diagnosis not present

## 2023-08-11 DIAGNOSIS — M1990 Unspecified osteoarthritis, unspecified site: Secondary | ICD-10-CM | POA: Diagnosis not present

## 2023-08-11 DIAGNOSIS — I130 Hypertensive heart and chronic kidney disease with heart failure and stage 1 through stage 4 chronic kidney disease, or unspecified chronic kidney disease: Secondary | ICD-10-CM | POA: Diagnosis not present

## 2023-08-11 NOTE — Progress Notes (Signed)
Anesthesia Chart Review   Case: [id number] Date/Time: 08/12/23 1300   Procedures:      CYSTOSCOPY WITH BIOPSY FULGURATION  BILATERAL RETROGRADE PYELOGRAM (Bilateral)     POSSIBLE TRANSURETHRAL RESECTION OF BLADDER TUMOR (TURBT) - 45 MINS FOR CASE   Anesthesia type: General   Pre-op diagnosis: BLADDER TUMOR   Location: [facility] PROCEDURE [facility] / [facility]   Surgeons: Crista Elliot, MD       DISCUSSION:88 y.o. former smoker with h/o HTN, DM Type I, hypothyroidism, a-fib, CKD Stage IIIb, bladder tumor scheduled for above procedure 08/12/2023 with Dr. Modena Slater.   Pt with recent admission for DKA 10/20-10/24/2024. Pt seen by endo and PCP after hospitalization.  No changes to insulin at this time.  There is a note that patient has inquired about an insulin pump, this has not been initiated. Glucose at PAT visit 438, non fasting.  Discussed with Dr. Shannan Harper office.  If case is note emergent recommend better management of diabetes before proceeding.  High risk of DOS cancellation if blood sugar remains high.  Attempted to call patient to discuss home readings and recent  endo visit, no answer, mailbox full.   VS: BP (!) 145/59   Pulse 79   Temp 36.9 C (Oral)   Resp 16   Ht 5\' 2"  (1.575 m)   Wt 56.7 kg   SpO2 97%   BMI 22.86 kg/m   PROVIDERS: [name], MD is PCP   Zoila Shutter, MD is Cardiologist  LABS:  labs forwarded to surgeon and PCP (all labs ordered are listed, but only abnormal results are displayed)  Labs Reviewed  HEMOGLOBIN A1C - Abnormal; Notable for the following components:      Result Value   Hgb A1c MFr Bld 10.1 (*)    All other components within normal limits  CBC - Abnormal; Notable for the following components:   RBC 3.59 (*)    Hemoglobin 10.4 (*)    HCT 33.4 (*)    All other components within normal limits  BASIC METABOLIC PANEL - Abnormal; Notable for the following components:   Sodium 130 (*)    Chloride 97 (*)    CO2 21 (*)    Glucose, Bld 466 (*)     Calcium 8.5 (*)    All other components within normal limits  GLUCOSE, CAPILLARY - Abnormal; Notable for the following components:   Glucose-Capillary 438 (*)    All other components within normal limits     IMAGES:   EKG:   CV: Echo 09/01/21 1. Left ventricular ejection fraction, by estimation, is 65 to 70%. The  left ventricle has normal function. The left ventricle has no regional  wall motion abnormalities. There is mild left ventricular hypertrophy.  Left ventricular diastolic parameters  are indeterminate.   2. Right ventricular systolic function is mildly reduced. The right  ventricular size is normal. There is normal pulmonary artery systolic  pressure. The estimated right ventricular systolic pressure is 31.7 mmHg.   3. Left atrial size was mildly dilated. The atrial septum bows right  suggesting LA pressure overload.   4. The mitral valve is normal in structure. Trivial mitral valve  regurgitation. No evidence of mitral stenosis.   5. The aortic valve is tricuspid. Aortic valve regurgitation is not  visualized. Aortic valve sclerosis/calcification is present, without any  evidence of aortic stenosis.   6. The inferior vena cava is normal in size with greater than 50%  respiratory variability, suggesting right atrial  pressure of 3 mmHg.   7. The patient was in atrial fibrillation.  Past Medical History:  Diagnosis Date   Anemia    Arthritis    Colon polyp    Diabetes mellitus    type 1   Heart murmur    Hyperlipidemia    Hypertension    Hypothyroidism    Pneumonia    Poor appetite    Thyroid disease     Past Surgical History:  Procedure Laterality Date   ABDOMINAL HYSTERECTOMY     APPENDECTOMY     BIOPSY  02/07/2022   Procedure: BIOPSY;  Surgeon: Meryl Dare, MD;  Location: [facility] ENDOSCOPY;  Service: Gastroenterology;;   BREAST EXCISIONAL BIOPSY Right    CARDIOVASCULAR STRESS TEST  07/15/2006   Normal scan, no ECG changes   COLONOSCOPY WITH  PROPOFOL N/A 02/07/2022   Procedure: COLONOSCOPY WITH PROPOFOL;  Surgeon: Meryl Dare, MD;  Location[facility] ENDOSCOPY;  Service: Gastroenterology;  Laterality: N/A;   CYSTOSCOPY WITH BIOPSY Bilateral 11/19/2022   Procedure: CYSTOSCOPY WITH TRANSURETHRAL RESECTION OF BLADDER TUMOR BILATERAL RETROGRADE PYELOGRAM;  Surgeon: Crista Elliot, MD;  Location: [facility];  Service: Urology;  Laterality: Bilateral;  1 HR FOR CASE   ESOPHAGOGASTRODUODENOSCOPY (EGD) WITH PROPOFOL N/A 02/07/2022   Procedure: ESOPHAGOGASTRODUODENOSCOPY (EGD) WITH PROPOFOL;  Surgeon: Meryl Dare, MD;  Location: [facility] ENDOSCOPY;  Service: Gastroenterology;  Laterality: N/A;   TRANSTHORACIC ECHOCARDIOGRAM  01/08/2011   EF 60-65%, moderae mitral regurg, LA mild-moderately dilated,   TRANSURETHRAL RESECTION OF BLADDER TUMOR N/A 04/04/2023   Procedure: TRANSURETHRAL RESECTION OF BLADDER TUMOR (TURBT);  Surgeon: Crista Elliot, MD;  Location: [facility];  Service: Urology;  Laterality: N/A;  1 HR FOR CASE    MEDICATIONS:  acetaminophen (TYLENOL) 500 MG tablet   amiodarone (PACERONE) 100 MG tablet   amiodarone (PACERONE) 200 MG tablet   apixaban (ELIQUIS) 2.5 MG TABS tablet   Ascorbic Acid (VITAMIN C) 100 MG tablet   Calcium Polycarbophil (FIBER-CAPS PO)   diltiazem (CARDIZEM CD) 180 MG 24 hr capsule   furosemide (LASIX) 20 MG tablet   insulin aspart (NOVOLOG FLEXPEN) 100 UNIT/ML FlexPen   insulin glargine, 2 Unit Dial, (TOUJEO MAX SOLOSTAR) 300 UNIT/ML Solostar Pen   KLOR-CON M20 20 MEQ tablet   levothyroxine (SYNTHROID) 112 MCG tablet   mirtazapine (REMERON) 30 MG tablet   nitrofurantoin, macrocrystal-monohydrate, (MACROBID) 100 MG capsule   pantoprazole (PROTONIX) 40 MG tablet   TIADYLT ER 180 MG 24 hr capsule   trimethoprim (TRIMPEX) 100 MG tablet   No current facility-administered medications for this encounter.      Jodell Cipro Ward, PA-C [facility] Pre-Surgical Testing [phone]
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
## 2023-08-12 DIAGNOSIS — N1832 Chronic kidney disease, stage 3b: Secondary | ICD-10-CM | POA: Diagnosis not present

## 2023-08-12 DIAGNOSIS — N179 Acute kidney failure, unspecified: Secondary | ICD-10-CM | POA: Diagnosis not present

## 2023-08-12 DIAGNOSIS — I509 Heart failure, unspecified: Secondary | ICD-10-CM | POA: Diagnosis not present

## 2023-08-12 DIAGNOSIS — I130 Hypertensive heart and chronic kidney disease with heart failure and stage 1 through stage 4 chronic kidney disease, or unspecified chronic kidney disease: Secondary | ICD-10-CM | POA: Diagnosis not present

## 2023-08-12 DIAGNOSIS — M1990 Unspecified osteoarthritis, unspecified site: Secondary | ICD-10-CM | POA: Diagnosis not present

## 2023-08-12 DIAGNOSIS — I480 Paroxysmal atrial fibrillation: Secondary | ICD-10-CM | POA: Diagnosis not present

## 2023-08-12 DIAGNOSIS — E440 Moderate protein-calorie malnutrition: Secondary | ICD-10-CM | POA: Diagnosis not present

## 2023-08-12 DIAGNOSIS — E1022 Type 1 diabetes mellitus with diabetic chronic kidney disease: Secondary | ICD-10-CM | POA: Diagnosis not present

## 2023-08-12 DIAGNOSIS — D631 Anemia in chronic kidney disease: Secondary | ICD-10-CM | POA: Diagnosis not present

## 2023-08-15 ENCOUNTER — Ambulatory Visit: Payer: Self-pay

## 2023-08-15 NOTE — Patient Outreach (Signed)
  Care Coordination   08/15/2023 Name: [name] MRN: [id number] DOB: [birth year]   Care Coordination Outreach Attempts:  An unsuccessful telephone outreach was attempted for a scheduled appointment today.  Follow Up Plan:  Additional outreach attempts will be made to offer the patient care coordination information and services.   Encounter Outcome:  No Answer   Care Coordination Interventions:  No, not indicated    Bary Leriche, RN, MSN [facility]  [facility], [facility] Management Community Coordinator Direct Dial: [phone]  Fax: [phone] Website: [email or web address]

## 2023-08-16 DIAGNOSIS — D649 Anemia, unspecified: Secondary | ICD-10-CM | POA: Diagnosis not present

## 2023-08-16 DIAGNOSIS — I480 Paroxysmal atrial fibrillation: Secondary | ICD-10-CM | POA: Diagnosis not present

## 2023-08-16 DIAGNOSIS — Z8639 Personal history of other endocrine, nutritional and metabolic disease: Secondary | ICD-10-CM | POA: Diagnosis not present

## 2023-08-16 DIAGNOSIS — E441 Mild protein-calorie malnutrition: Secondary | ICD-10-CM | POA: Diagnosis not present

## 2023-08-17 DIAGNOSIS — I509 Heart failure, unspecified: Secondary | ICD-10-CM | POA: Diagnosis not present

## 2023-08-17 DIAGNOSIS — N1832 Chronic kidney disease, stage 3b: Secondary | ICD-10-CM | POA: Diagnosis not present

## 2023-08-17 DIAGNOSIS — R748 Abnormal levels of other serum enzymes: Secondary | ICD-10-CM | POA: Diagnosis not present

## 2023-08-17 DIAGNOSIS — Z8639 Personal history of other endocrine, nutritional and metabolic disease: Secondary | ICD-10-CM | POA: Diagnosis not present

## 2023-08-17 DIAGNOSIS — E1022 Type 1 diabetes mellitus with diabetic chronic kidney disease: Secondary | ICD-10-CM | POA: Diagnosis not present

## 2023-08-17 DIAGNOSIS — D649 Anemia, unspecified: Secondary | ICD-10-CM | POA: Diagnosis not present

## 2023-08-17 DIAGNOSIS — E440 Moderate protein-calorie malnutrition: Secondary | ICD-10-CM | POA: Diagnosis not present

## 2023-08-17 DIAGNOSIS — D631 Anemia in chronic kidney disease: Secondary | ICD-10-CM | POA: Diagnosis not present

## 2023-08-17 DIAGNOSIS — M1990 Unspecified osteoarthritis, unspecified site: Secondary | ICD-10-CM | POA: Diagnosis not present

## 2023-08-17 DIAGNOSIS — I130 Hypertensive heart and chronic kidney disease with heart failure and stage 1 through stage 4 chronic kidney disease, or unspecified chronic kidney disease: Secondary | ICD-10-CM | POA: Diagnosis not present

## 2023-08-17 DIAGNOSIS — I480 Paroxysmal atrial fibrillation: Secondary | ICD-10-CM | POA: Diagnosis not present

## 2023-08-17 DIAGNOSIS — N179 Acute kidney failure, unspecified: Secondary | ICD-10-CM | POA: Diagnosis not present

## 2023-08-18 DIAGNOSIS — I509 Heart failure, unspecified: Secondary | ICD-10-CM | POA: Diagnosis not present

## 2023-08-18 DIAGNOSIS — M1990 Unspecified osteoarthritis, unspecified site: Secondary | ICD-10-CM | POA: Diagnosis not present

## 2023-08-18 DIAGNOSIS — D631 Anemia in chronic kidney disease: Secondary | ICD-10-CM | POA: Diagnosis not present

## 2023-08-18 DIAGNOSIS — E1022 Type 1 diabetes mellitus with diabetic chronic kidney disease: Secondary | ICD-10-CM | POA: Diagnosis not present

## 2023-08-18 DIAGNOSIS — E440 Moderate protein-calorie malnutrition: Secondary | ICD-10-CM | POA: Diagnosis not present

## 2023-08-18 DIAGNOSIS — I130 Hypertensive heart and chronic kidney disease with heart failure and stage 1 through stage 4 chronic kidney disease, or unspecified chronic kidney disease: Secondary | ICD-10-CM | POA: Diagnosis not present

## 2023-08-18 DIAGNOSIS — I480 Paroxysmal atrial fibrillation: Secondary | ICD-10-CM | POA: Diagnosis not present

## 2023-08-18 DIAGNOSIS — N1832 Chronic kidney disease, stage 3b: Secondary | ICD-10-CM | POA: Diagnosis not present

## 2023-08-18 DIAGNOSIS — N179 Acute kidney failure, unspecified: Secondary | ICD-10-CM | POA: Diagnosis not present

## 2023-08-19 DIAGNOSIS — I509 Heart failure, unspecified: Secondary | ICD-10-CM | POA: Diagnosis not present

## 2023-08-19 DIAGNOSIS — M1990 Unspecified osteoarthritis, unspecified site: Secondary | ICD-10-CM | POA: Diagnosis not present

## 2023-08-19 DIAGNOSIS — N179 Acute kidney failure, unspecified: Secondary | ICD-10-CM | POA: Diagnosis not present

## 2023-08-19 DIAGNOSIS — E1022 Type 1 diabetes mellitus with diabetic chronic kidney disease: Secondary | ICD-10-CM | POA: Diagnosis not present

## 2023-08-19 DIAGNOSIS — E440 Moderate protein-calorie malnutrition: Secondary | ICD-10-CM | POA: Diagnosis not present

## 2023-08-19 DIAGNOSIS — D631 Anemia in chronic kidney disease: Secondary | ICD-10-CM | POA: Diagnosis not present

## 2023-08-19 DIAGNOSIS — N1832 Chronic kidney disease, stage 3b: Secondary | ICD-10-CM | POA: Diagnosis not present

## 2023-08-19 DIAGNOSIS — I480 Paroxysmal atrial fibrillation: Secondary | ICD-10-CM | POA: Diagnosis not present

## 2023-08-19 DIAGNOSIS — I130 Hypertensive heart and chronic kidney disease with heart failure and stage 1 through stage 4 chronic kidney disease, or unspecified chronic kidney disease: Secondary | ICD-10-CM | POA: Diagnosis not present

## 2023-08-22 DIAGNOSIS — I130 Hypertensive heart and chronic kidney disease with heart failure and stage 1 through stage 4 chronic kidney disease, or unspecified chronic kidney disease: Secondary | ICD-10-CM | POA: Diagnosis not present

## 2023-08-22 DIAGNOSIS — I509 Heart failure, unspecified: Secondary | ICD-10-CM | POA: Diagnosis not present

## 2023-08-22 DIAGNOSIS — E1022 Type 1 diabetes mellitus with diabetic chronic kidney disease: Secondary | ICD-10-CM | POA: Diagnosis not present

## 2023-08-22 DIAGNOSIS — N1832 Chronic kidney disease, stage 3b: Secondary | ICD-10-CM | POA: Diagnosis not present

## 2023-08-23 DIAGNOSIS — M1990 Unspecified osteoarthritis, unspecified site: Secondary | ICD-10-CM | POA: Diagnosis not present

## 2023-08-23 DIAGNOSIS — E440 Moderate protein-calorie malnutrition: Secondary | ICD-10-CM | POA: Diagnosis not present

## 2023-08-23 DIAGNOSIS — I509 Heart failure, unspecified: Secondary | ICD-10-CM | POA: Diagnosis not present

## 2023-08-23 DIAGNOSIS — I480 Paroxysmal atrial fibrillation: Secondary | ICD-10-CM | POA: Diagnosis not present

## 2023-08-23 DIAGNOSIS — D631 Anemia in chronic kidney disease: Secondary | ICD-10-CM | POA: Diagnosis not present

## 2023-08-23 DIAGNOSIS — E1022 Type 1 diabetes mellitus with diabetic chronic kidney disease: Secondary | ICD-10-CM | POA: Diagnosis not present

## 2023-08-23 DIAGNOSIS — N1832 Chronic kidney disease, stage 3b: Secondary | ICD-10-CM | POA: Diagnosis not present

## 2023-08-23 DIAGNOSIS — I130 Hypertensive heart and chronic kidney disease with heart failure and stage 1 through stage 4 chronic kidney disease, or unspecified chronic kidney disease: Secondary | ICD-10-CM | POA: Diagnosis not present

## 2023-08-23 DIAGNOSIS — N179 Acute kidney failure, unspecified: Secondary | ICD-10-CM | POA: Diagnosis not present

## 2023-08-30 ENCOUNTER — Telehealth: Payer: Self-pay

## 2023-08-30 NOTE — Patient Outreach (Signed)
  Care Coordination   08/30/2023 Name: [name] MRN: [id number] DOB: 1935-09-06   Care Coordination Outreach Attempts:  A second unsuccessful outreach was attempted today to offer the patient with information about available care coordination services.  Follow Up Plan:  Additional outreach attempts will be made to offer the patient care coordination information and services.   Encounter Outcome:  No Answer   Care Coordination Interventions:  No, not indicated    Bary Leriche, RN, MSN [facility]  [facility], [facility] Management Community Coordinator Direct Dial: [phone]  Fax: [phone] Website: [email or web address]

## 2023-08-31 DIAGNOSIS — E440 Moderate protein-calorie malnutrition: Secondary | ICD-10-CM | POA: Diagnosis not present

## 2023-08-31 DIAGNOSIS — N1832 Chronic kidney disease, stage 3b: Secondary | ICD-10-CM | POA: Diagnosis not present

## 2023-08-31 DIAGNOSIS — M1990 Unspecified osteoarthritis, unspecified site: Secondary | ICD-10-CM | POA: Diagnosis not present

## 2023-08-31 DIAGNOSIS — I130 Hypertensive heart and chronic kidney disease with heart failure and stage 1 through stage 4 chronic kidney disease, or unspecified chronic kidney disease: Secondary | ICD-10-CM | POA: Diagnosis not present

## 2023-08-31 DIAGNOSIS — N179 Acute kidney failure, unspecified: Secondary | ICD-10-CM | POA: Diagnosis not present

## 2023-08-31 DIAGNOSIS — I480 Paroxysmal atrial fibrillation: Secondary | ICD-10-CM | POA: Diagnosis not present

## 2023-08-31 DIAGNOSIS — D631 Anemia in chronic kidney disease: Secondary | ICD-10-CM | POA: Diagnosis not present

## 2023-08-31 DIAGNOSIS — E1022 Type 1 diabetes mellitus with diabetic chronic kidney disease: Secondary | ICD-10-CM | POA: Diagnosis not present

## 2023-08-31 DIAGNOSIS — I509 Heart failure, unspecified: Secondary | ICD-10-CM | POA: Diagnosis not present

## 2023-09-06 NOTE — Progress Notes (Signed)
COVID Vaccine received:  []  No [x]  Yes Date of any COVID positive Test in last 90 days: no PCP - [name] MD Cardiologist - Zoila Shutter MD  Chest x-ray - 07/31/23 Epic EKG - 07/31/23 Epic  Stress Test -  ECHO - 09/01/21 Epic Cardiac Cath -   Bowel Prep - []  No  []   Yes ______  Pacemaker / ICD device [x]  No []  Yes   Spinal Cord Stimulator:[x]  No []  Yes       History of Sleep Apnea? [x]  No []  Yes   CPAP used?- [x]  No []  Yes    Does the patient monitor blood sugar?          []  No [x]  Yes  []  N/A  Patient has: []  NO Hx DM   []  Pre-DM                 [x]  DM1  []   DM2 Does patient have a [employer] or Dexacom? []  No [x]  Yes   Fasting Blood Sugar Ranges- 248 Checks Blood Sugar ___4__ times a day  GLP1 agonist / usual dose - no GLP1 instructions:  SGLT-2 inhibitors / usual dose - no SGLT-2 instructions:   Blood Thinner / Instructions:Eliquis- stop 2 days prior to surgery Aspirin Instructions:  Comments:   Activity level: Patient is able  to climb a flight of stairs without difficulty; [x]  No CP  [x]  No SOB,  ___   Patient can perform ADLs without assistance.   Anesthesia review:   Patient denies shortness of breath, fever, cough and chest pain at PAT appointment.  Patient verbalized understanding and agreement to the Pre-Surgical Instructions that were given to them at this PAT appointment. Patient was also educated of the need to review these PAT instructions again prior to his/her surgery.I reviewed the appropriate phone numbers to call if they have any and questions or concerns.

## 2023-09-06 NOTE — Patient Instructions (Signed)
SURGICAL WAITING ROOM VISITATION  Patients having surgery or a procedure may have no more than 2 support people in the waiting area - these visitors may rotate.    Children under the age of 17 must have an adult with them who is not the patient.  Due to an increase in RSV and influenza rates and associated hospitalizations, children ages 74 and under may not visit patients in [facility] hospitals.  If the patient needs to stay at the hospital during part of their recovery, the visitor guidelines for inpatient rooms apply. Pre-op nurse will coordinate an appropriate time for 1 support person to accompany patient in pre-op.  This support person may not rotate.    Please refer to the [facility] website for the visitor guidelines for Inpatients (after your surgery is over and you are in a regular room).       Your procedure is scheduled on: 09/12/23   Report to [facility] Main Entrance    Report to admitting at 7:30 AM   Call this number if you have problems the morning of surgery [phone]   Do not eat food  or drink liquids :After Midnight.        Oral Hygiene is also important to reduce your risk of infection.                                    Remember - BRUSH YOUR TEETH THE MORNING OF SURGERY WITH YOUR REGULAR TOOTHPASTE  DENTURES WILL BE REMOVED PRIOR TO SURGERY PLEASE DO NOT APPLY "Poly grip" OR ADHESIVES!!!   Stop all vitamins and herbal supplements 7 days before surgery.   Take these medicines the morning of surgery with A SIP OF WATER: Tylenol, Amiodarone, Cardizem, Levothyroxine, Tiadylt, Pantoprazole, Trimethoprim  DO NOT TAKE ANY ORAL DIABETIC MEDICATIONS DAY OF YOUR SURGERY Do not take the evening dose of Novolog insulin the evening prior to surgery. The day of surgery if Blood sugar is >220 take half of Novolog dose. Take half of Toujeo insulin dose the evening Prior to surgery.             You may not have any metal on your body including hair pins,  jewelry, and body piercing             Do not wear make-up, lotions, powders, perfumes/cologne, or deodorant  Do not wear nail polish including gel and S&S, artificial/acrylic nails, or any other type of covering on natural nails including finger and toenails. If you have artificial nails, gel coating, etc. that needs to be removed by a nail salon please have this removed prior to surgery or surgery may need to be canceled/ delayed if the surgeon/ anesthesia feels like they are unable to be safely monitored.   Do not shave  48 hours prior to surgery.    Do not bring valuables to the hospital. [facility] IS NOT             RESPONSIBLE   FOR VALUABLES.   Contacts, glasses, dentures or bridgework may not be worn into surgery.   Bring small overnight bag day of surgery.   DO NOT BRING YOUR HOME MEDICATIONS TO THE HOSPITAL. PHARMACY WILL DISPENSE MEDICATIONS LISTED ON YOUR MEDICATION LIST TO YOU DURING YOUR ADMISSION IN THE HOSPITAL!    Patients discharged on the day of surgery will not be allowed to drive home.  Someone NEEDS  to stay with you for the first 24 hours after anesthesia.   Special Instructions: Bring a copy of your healthcare power of attorney and living will documents the day of surgery if you haven't scanned them before.              Please read over the following fact sheets you were given: IF YOU HAVE QUESTIONS ABOUT YOUR PRE-OP INSTRUCTIONS PLEASE CALL [phone] [facility]   If you received a COVID test during your pre-op visit  it is requested that you wear a mask when out in public, stay away from anyone that may not be feeling well and notify your surgeon if you develop symptoms. If you test positive for Covid or have been in contact with anyone that has tested positive in the last 10 days please notify you surgeon.    [place] - Preparing for Surgery Before surgery, you can play an important role.  Because skin is not sterile, your skin needs to be as free of germs as  possible.  You can reduce the number of germs on your skin by washing with CHG (chlorahexidine gluconate) soap before surgery.  CHG is an antiseptic cleaner which kills germs and bonds with the skin to continue killing germs even after washing. Please DO NOT use if you have an allergy to CHG or antibacterial soaps.  If your skin becomes reddened/irritated stop using the CHG and inform your nurse when you arrive at Short Stay. Do not shave (including legs and underarms) for at least 48 hours prior to the first CHG shower.  You may shave your face/neck.  Please follow these instructions carefully:  1.  Shower with CHG Soap the night before surgery and the  morning of surgery.  2.  If you choose to wash your hair, wash your hair first as usual with your normal  shampoo.  3.  After you shampoo, rinse your hair and body thoroughly to remove the shampoo.                             4.  Use CHG as you would any other liquid soap.  You can apply chg directly to the skin and wash.  Gently with a scrungie or clean washcloth.  5.  Apply the CHG Soap to your body ONLY FROM THE NECK DOWN.   Do   not use on face/ open                           Wound or open sores. Avoid contact with eyes, ears mouth and   genitals (private parts).                       Wash face,  Genitals (private parts) with your normal soap.             6.  Wash thoroughly, paying special attention to the area where your    surgery  will be performed.  7.  Thoroughly rinse your body with warm water from the neck down.  8.  DO NOT shower/wash with your normal soap after using and rinsing off the CHG Soap.                9.  Pat yourself dry with a clean towel.            10.  Wear clean pajamas.  11.  Place clean sheets on your bed the night of your first shower and do not  sleep with pets. Day of Surgery : Do not apply any lotions/deodorants the morning of surgery.  Please wear clean clothes to the hospital/surgery  center.  FAILURE TO FOLLOW THESE INSTRUCTIONS MAY RESULT IN THE CANCELLATION OF YOUR SURGERY  PATIENT SIGNATURE_________________________________  NURSE SIGNATURE__________________________________  ________________________________________________________________________

## 2023-09-07 ENCOUNTER — Encounter (HOSPITAL_COMMUNITY): Payer: Self-pay

## 2023-09-07 ENCOUNTER — Encounter (HOSPITAL_COMMUNITY)
Admission: RE | Admit: 2023-09-07 | Discharge: 2023-09-07 | Disposition: A | Discharge status: Home / Self Care | Payer: Medicare PPO | Source: Ambulatory Visit | Attending: Urology | Admitting: Urology

## 2023-09-07 ENCOUNTER — Other Ambulatory Visit: Payer: Self-pay

## 2023-09-07 ENCOUNTER — Telehealth: Payer: Self-pay

## 2023-09-07 VITALS — BP 133/66 | HR 79 | Temp 97.8°F | Resp 16 | Ht 62.0 in | Wt 118.0 lb

## 2023-09-07 DIAGNOSIS — E1065 Type 1 diabetes mellitus with hyperglycemia: Secondary | ICD-10-CM | POA: Diagnosis not present

## 2023-09-07 DIAGNOSIS — I10 Essential (primary) hypertension: Secondary | ICD-10-CM | POA: Insufficient documentation

## 2023-09-07 DIAGNOSIS — Z01812 Encounter for preprocedural laboratory examination: Secondary | ICD-10-CM | POA: Diagnosis not present

## 2023-09-07 DIAGNOSIS — E1322 Other specified diabetes mellitus with diabetic chronic kidney disease: Secondary | ICD-10-CM | POA: Diagnosis not present

## 2023-09-07 DIAGNOSIS — N183 Chronic kidney disease, stage 3 unspecified: Secondary | ICD-10-CM | POA: Diagnosis not present

## 2023-09-07 DIAGNOSIS — E1329 Other specified diabetes mellitus with other diabetic kidney complication: Secondary | ICD-10-CM | POA: Diagnosis not present

## 2023-09-07 DIAGNOSIS — E119 Type 2 diabetes mellitus without complications: Secondary | ICD-10-CM | POA: Insufficient documentation

## 2023-09-07 DIAGNOSIS — E1365 Other specified diabetes mellitus with hyperglycemia: Secondary | ICD-10-CM | POA: Diagnosis not present

## 2023-09-07 DIAGNOSIS — R809 Proteinuria, unspecified: Secondary | ICD-10-CM | POA: Diagnosis not present

## 2023-09-07 DIAGNOSIS — E890 Postprocedural hypothyroidism: Secondary | ICD-10-CM | POA: Diagnosis not present

## 2023-09-07 DIAGNOSIS — Z794 Long term (current) use of insulin: Secondary | ICD-10-CM | POA: Diagnosis not present

## 2023-09-07 DIAGNOSIS — E139 Other specified diabetes mellitus without complications: Secondary | ICD-10-CM | POA: Diagnosis not present

## 2023-09-07 LAB — BASIC METABOLIC PANEL
Anion gap: 15 (ref 5–15)
BUN: 28 mg/dL — ABNORMAL HIGH (ref 8–23)
CO2: 20 mmol/L — ABNORMAL LOW (ref 22–32)
Calcium: 9 mg/dL (ref 8.9–10.3)
Chloride: 95 mmol/L — ABNORMAL LOW (ref 98–111)
Creatinine, Ser: 1.39 mg/dL — ABNORMAL HIGH (ref 0.44–1.00)
GFR, Estimated: 37 mL/min — ABNORMAL LOW (ref >60)
Glucose, Bld: 310 mg/dL — ABNORMAL HIGH (ref 70–99)
Potassium: 3.8 mmol/L (ref 3.5–5.1)
Sodium: 130 mmol/L — ABNORMAL LOW (ref 135–145)

## 2023-09-07 LAB — GLUCOSE, CAPILLARY: Glucose-Capillary: 252 mg/dL — ABNORMAL HIGH (ref 70–99)

## 2023-09-07 LAB — CBC
HCT: 36.1 % (ref 36.0–46.0)
Hemoglobin: 11.4 g/dL — ABNORMAL LOW (ref 12.0–15.0)
MCH: 28.4 pg (ref 26.0–34.0)
MCHC: 31.6 g/dL (ref 30.0–36.0)
MCV: 89.8 fL (ref 80.0–100.0)
Platelets: 266 10*3/uL (ref 150–400)
RBC: 4.02 MIL/uL (ref 3.87–5.11)
RDW: 14.1 % (ref 11.5–15.5)
WBC: 7.7 10*3/uL (ref 4.0–10.5)
nRBC: 0 % (ref 0.0–0.2)

## 2023-09-07 NOTE — Patient Outreach (Signed)
  Care Coordination   09/07/2023 Name: [name] MRN: [id number] DOB: 1934-12-14   Care Coordination Outreach Attempts:  A third unsuccessful outreach was attempted today to offer the patient with information about available care coordination services.  Follow Up Plan:  No further outreach attempts will be made at this time. We have been unable to contact the patient to offer or enroll patient in care coordination services  Encounter Outcome:  No Answer   Care Coordination Interventions:  No, not indicated    Karen Levi Idelle Jo, RN, MSN RN Care Manager [facility], Population Health Direct Dial: [phone]  Fax: [phone] Website: [email or web address]

## 2023-09-10 ENCOUNTER — Other Ambulatory Visit: Payer: Self-pay

## 2023-09-10 ENCOUNTER — Emergency Department (HOSPITAL_BASED_OUTPATIENT_CLINIC_OR_DEPARTMENT_OTHER)
Admission: EM | Admit: 2023-09-10 | Discharge: 2023-09-10 | Disposition: A | Discharge status: Home / Self Care | Payer: Medicare PPO | Attending: Emergency Medicine | Admitting: Emergency Medicine

## 2023-09-10 ENCOUNTER — Encounter (HOSPITAL_BASED_OUTPATIENT_CLINIC_OR_DEPARTMENT_OTHER): Payer: Self-pay

## 2023-09-10 DIAGNOSIS — R531 Weakness: Secondary | ICD-10-CM | POA: Insufficient documentation

## 2023-09-10 DIAGNOSIS — E876 Hypokalemia: Secondary | ICD-10-CM | POA: Insufficient documentation

## 2023-09-10 DIAGNOSIS — Z794 Long term (current) use of insulin: Secondary | ICD-10-CM | POA: Diagnosis not present

## 2023-09-10 DIAGNOSIS — R9431 Abnormal electrocardiogram [ECG] [EKG]: Secondary | ICD-10-CM | POA: Insufficient documentation

## 2023-09-10 DIAGNOSIS — E109 Type 1 diabetes mellitus without complications: Secondary | ICD-10-CM | POA: Diagnosis not present

## 2023-09-10 DIAGNOSIS — Z7901 Long term (current) use of anticoagulants: Secondary | ICD-10-CM | POA: Diagnosis not present

## 2023-09-10 DIAGNOSIS — I480 Paroxysmal atrial fibrillation: Secondary | ICD-10-CM | POA: Diagnosis not present

## 2023-09-10 LAB — CBC
HCT: 34.2 % — ABNORMAL LOW (ref 36.0–46.0)
Hemoglobin: 11.2 g/dL — ABNORMAL LOW (ref 12.0–15.0)
MCH: 28.6 pg (ref 26.0–34.0)
MCHC: 32.7 g/dL (ref 30.0–36.0)
MCV: 87.5 fL (ref 80.0–100.0)
Platelets: 237 10*3/uL (ref 150–400)
RBC: 3.91 MIL/uL (ref 3.87–5.11)
RDW: 13.9 % (ref 11.5–15.5)
WBC: 7.3 10*3/uL (ref 4.0–10.5)
nRBC: 0 % (ref 0.0–0.2)

## 2023-09-10 LAB — I-STAT CHEM 8, ED
BUN: 22 mg/dL (ref 8–23)
Calcium, Ion: 1.15 mmol/L (ref 1.15–1.40)
Chloride: 99 mmol/L (ref 98–111)
Creatinine, Ser: 1.3 mg/dL — ABNORMAL HIGH (ref 0.44–1.00)
Glucose, Bld: 211 mg/dL — ABNORMAL HIGH (ref 70–99)
HCT: 36 % (ref 36.0–46.0)
Hemoglobin: 12.2 g/dL (ref 12.0–15.0)
Potassium: 3.2 mmol/L — ABNORMAL LOW (ref 3.5–5.1)
Sodium: 137 mmol/L (ref 135–145)
TCO2: 22 mmol/L (ref 22–32)

## 2023-09-10 LAB — URINALYSIS, MICROSCOPIC (REFLEX): WBC, UA: >50 WBC/hpf (ref 0–5)

## 2023-09-10 LAB — TROPONIN I (HIGH SENSITIVITY): Troponin I (High Sensitivity): 4 ng/L (ref <18)

## 2023-09-10 LAB — URINALYSIS, ROUTINE W REFLEX MICROSCOPIC
Bilirubin Urine: NEGATIVE
Glucose, UA: >=500 mg/dL — AB
Ketones, ur: NEGATIVE mg/dL
Nitrite: NEGATIVE
Protein, ur: NEGATIVE mg/dL
Specific Gravity, Urine: 1.02 (ref 1.005–1.030)
pH: 6 (ref 5.0–8.0)

## 2023-09-10 LAB — MAGNESIUM: Magnesium: 2 mg/dL (ref 1.7–2.4)

## 2023-09-10 LAB — CBG MONITORING, ED: Glucose-Capillary: 261 mg/dL — ABNORMAL HIGH (ref 70–99)

## 2023-09-10 MED ORDER — POTASSIUM CHLORIDE CRYS ER 20 MEQ PO TBCR
40.0000 meq | EXTENDED_RELEASE_TABLET | Freq: Once | ORAL | Status: AC
  Administered 2023-09-10: 40 meq via ORAL
  Filled 2023-09-10: qty 2

## 2023-09-10 MED ORDER — LACTATED RINGERS IV BOLUS
1000.0000 mL | Freq: Once | INTRAVENOUS | Status: AC
  Administered 2023-09-10: 1000 mL via INTRAVENOUS

## 2023-09-10 MED ORDER — MAGNESIUM SULFATE 2 GM/50ML IV SOLN
2.0000 g | INTRAVENOUS | Status: AC
  Administered 2023-09-10: 2 g via INTRAVENOUS
  Filled 2023-09-10: qty 50

## 2023-09-10 NOTE — ED Triage Notes (Signed)
The patient has had some weakness since yesterday. She denied cough, shortness of breath, chest pain, N/V/D, fever. Recent admit for diabetes.

## 2023-09-10 NOTE — ED Provider Notes (Signed)
[place] [facility] Provider Note   CSN: [id number] Arrival date & time: 09/10/23  1324     History  Chief Complaint  Patient presents with   Weakness    [name] is a 88 y.o. female who presents for feeling weak. History is given by the patient, review of EMR, and her daughter, [name] at bedside.  Patient has history of insulin-dependent diabetes, unexplained weight loss, paroxysmal atrial fibrillation on blood thinning medications.  Denies melena or hematochezia.  Patient reports that her weakness has been progressive over time but also waxes and wanes.  She thinks she might be dehydrated.  Her daughter reports that she is lost a significant amount of weight and was 128 pounds when she was discharged from the hospital 2 weeks ago after a DKA episode and is now down to 113 pounds.  She was recently taken off of her Trulicity because of her weight loss and is currently being transitioned to an insulin pump.  She is scheduled for a retrograde cystoscopy with biopsy in 2 weeks.  She denies fevers, urinary symptoms, abdominal pain, shortness of breath, near syncope.  Patient reports that she is just too tired to stand up for long .   Weakness      Home Medications Prior to Admission medications   Medication Sig Start Date End Date Taking? Authorizing Provider  acetaminophen (TYLENOL) 500 MG tablet Take 500 mg by mouth every 6 (six) hours as needed for moderate pain (pain score 4-6). Patient not taking: Reported on 09/07/2023    [provider]  amiodarone (PACERONE) 100 MG tablet Take 1 tablet (100 mg total) by mouth daily. Patient not taking: Reported on 09/07/2023 06/15/23   Carlos Levering, NP  amiodarone (PACERONE) 200 MG tablet TAKE 1 TABLET BY MOUTH EVERY DAY 08/09/23   Hilty, Lisette Abu, MD  apixaban (ELIQUIS) 2.5 MG TABS tablet TAKE 1 TABLET BY MOUTH TWICE A DAY 02/06/23   Hilty, Lisette Abu, MD  Ascorbic Acid (VITAMIN C) 100 MG  tablet Take 100 mg by mouth daily.    [provider]  Calcium Polycarbophil (FIBER-CAPS PO) Take 3-5 capsules by mouth as needed (constipation).    [provider]  furosemide (LASIX) 20 MG tablet Take 1 tablet (20 mg total) by mouth daily as needed for fluid or edema. 03/24/23   Carlos Levering, NP  insulin aspart (NOVOLOG FLEXPEN) 100 UNIT/ML FlexPen Inject 8 Units into the skin 3 (three) times daily with meals. Patient taking differently: Inject 11 Units into the skin 3 (three) times daily with meals. 08/04/23   Marinda Elk, MD  insulin glargine, 2 Unit Dial, (TOUJEO MAX SOLOSTAR) 300 UNIT/ML Solostar Pen Inject 14 Units into the skin at bedtime. Patient taking differently: Inject 12 Units into the skin at bedtime. 08/04/23   Marinda Elk, MD  KLOR-CON M20 20 MEQ tablet Take 20 mEq by mouth daily. 03/22/23   [provider]  levothyroxine (SYNTHROID) 100 MCG tablet Take 100 mcg by mouth daily before breakfast.    [provider]  levothyroxine (SYNTHROID) 112 MCG tablet Take 112 mcg by mouth daily before breakfast. Patient not taking: Reported on 09/07/2023    [provider]  mirtazapine (REMERON) 30 MG tablet Take 30 mg by mouth at bedtime as needed (sleep).    [provider]  nitrofurantoin, macrocrystal-monohydrate, (MACROBID) 100 MG capsule Take 100 mg by mouth at bedtime. 07/13/23   [provider]  pantoprazole (PROTONIX) 40  MG tablet Take 1 tablet (40 mg total) by mouth daily at 6 (six) AM. 02/09/22   Osvaldo Shipper, MD  TIADYLT ER 180 MG 24 hr capsule TAKE 1 CAPSULE BY MOUTH EVERY DAY 11/01/22   Hilty, Lisette Abu, MD  trimethoprim (TRIMPEX) 100 MG tablet Take 100 mg by mouth daily. 10/27/22   [provider]      Allergies    Patient has no known allergies.    Review of Systems   Review of Systems  Neurological:  Positive for weakness.    Physical Exam Updated Vital Signs BP 116/62   Pulse  63   Temp 97.7 F (36.5 C) (Oral)   Resp 20   Ht 5\' 2"  (1.575 m)   Wt 51.3 kg   SpO2 99%   BMI 20.67 kg/m  Physical Exam Vitals and nursing note reviewed.  Constitutional:      General: She is not in acute distress.    Appearance: She is underweight. She is not diaphoretic.  HENT:     Head: Normocephalic and atraumatic.     Right Ear: External ear normal.     Left Ear: External ear normal.     Nose: Nose normal.     Mouth/Throat:     Mouth: Mucous membranes are moist.  Eyes:     General: No scleral icterus.    Conjunctiva/sclera: Conjunctivae normal.  Cardiovascular:     Rate and Rhythm: Normal rate and regular rhythm.     Heart sounds: Normal heart sounds. No murmur heard.    No friction rub. No gallop.  Pulmonary:     Effort: Pulmonary effort is normal. No respiratory distress.     Breath sounds: Normal breath sounds.  Abdominal:     General: Bowel sounds are normal. There is no distension.     Palpations: Abdomen is soft. There is no mass.     Tenderness: There is no abdominal tenderness. There is no guarding.  Musculoskeletal:     Cervical back: Normal range of motion.  Skin:    General: Skin is warm and dry.  Neurological:     Mental Status: She is alert and oriented to person, place, and time.  Psychiatric:        Behavior: Behavior normal.     ED Results / Procedures / Treatments   Labs (all labs ordered are listed, but only abnormal results are displayed) Labs Reviewed  CBC - Abnormal; Notable for the following components:      Result Value   Hemoglobin 11.2 (*)    HCT 34.2 (*)    All other components within normal limits  URINALYSIS, ROUTINE W REFLEX MICROSCOPIC - Abnormal; Notable for the following components:   Glucose, UA >=500 (*)    Hgb urine dipstick SMALL (*)    Leukocytes,Ua SMALL (*)    All other components within normal limits  URINALYSIS, MICROSCOPIC (REFLEX) - Abnormal; Notable for the following components:   Bacteria, UA FEW (*)     All other components within normal limits  CBG MONITORING, ED - Abnormal; Notable for the following components:   Glucose-Capillary 261 (*)    All other components within normal limits  I-STAT CHEM 8, ED - Abnormal; Notable for the following components:   Potassium 3.2 (*)    Creatinine, Ser 1.30 (*)    Glucose, Bld 211 (*)    All other components within normal limits  MAGNESIUM  TROPONIN I (HIGH SENSITIVITY)    EKG EKG Interpretation Date/Time:  Saturday September 10 2023 10:08:42 EST Ventricular Rate:  62 PR Interval:  191 QRS Duration:  107 QT Interval:  619 QTC Calculation: 629 R Axis:   -18  Text Interpretation: Sinus rhythm Consider left atrial enlargement Probable left ventricular hypertrophy Borderline T abnormalities, anterior leads new Prolonged QT interval Confirmed by Gwyneth Sprout ([id number]) on 09/10/2023 11:18:19 AM  Radiology No results found.  Procedures Procedures    Medications Ordered in ED Medications  lactated ringers bolus 1,000 mL (0 mLs Intravenous Stopped 09/10/23 1155)  potassium chloride SA (KLOR-CON M) CR tablet 40 mEq (40 mEq Oral Given 09/10/23 1250)  magnesium sulfate IVPB 2 g 50 mL (0 g Intravenous Stopped 09/10/23 1252)    ED Course/ Medical Decision Making/ A&P Clinical Course as of 09/10/23 1349  Sat Sep 10, 2023  1043 Glucose-Capillary(!): 261 [AH]  1125 Potassium(!): 3.2 [AH]  1126 Creatinine(!): 1.30 [AH]  1306 Magnesium Mag wnl  [AH]    Clinical Course User Index [AH] Arthor Captain, PA-C                                 Medical Decision Making This patient presents to the ED for concern of weakness , this involves an extensive number of treatment options, and is a complaint that carries with it a high risk of complications and morbidity.  The differential diagnosis of weakness includes but is not limited to neurologic causes (GBS, myasthenia gravis, CVA, MS, ALS, transverse myelitis, spinal cord injury, CVA, botulism, )  and other causes: ACS, Arrhythmia, syncope, orthostatic hypotension, sepsis, hypoglycemia, electrolyte disturbance, hypothyroidism, respiratory failure, symptomatic anemia, dehydration, heat injury, polypharmacy, malignancy.    Co morbidities:       has a past medical history of Anemia, Arthritis, Colon polyp, Diabetes mellitus, Heart murmur, Hyperlipidemia, Hypertension, Hypothyroidism, Pneumonia, Poor appetite, and Thyroid disease.   Social Determinants of Health:       SDOH Screenings Food Insecurity: No Food Insecurity (07/31/2023) Housing: Low Risk  (07/31/2023) Transportation Needs: No Transportation Needs (08/11/2023) Utilities: Not At Risk (07/31/2023) Depression (PHQ2-9): Low Risk  (11/30/2022) Financial Resource Strain: Medium Risk (06/01/2023) Tobacco Use: Low Risk  (09/07/2023)     Received from [facility] Recent Concern: Tobacco Use - Medium Risk (09/07/2023) Health Literacy: Adequate Health Literacy (08/11/2023)   Additional history:  {Additional history obtained from daughter  {External records from outside source obtained and reviewed including Diabetes education records from [facility]  Lab Tests:  I Ordered, and personally interpreted labs.  The pertinent results include:   Urine appears contaminated.  I reviewed patient's previous urines and they all look the same.  Previous urine cultures have not grown out anything specific and other than urinary frequency in the setting of high blood sugars patient is not having burning frequency or urgency suggestive of UTI. Potassium was slightly low.  She was given oral potassium.  Creatinine is slightly above average but is consistent with her previous creatinine value. CBC without significant abnormality Troponin is negative. Patient noted to have QT prolongation however her magnesium level is normal, potassium repleted orally.    Imaging Studies:   Cardiac Monitoring/ECG:       The patient was maintained on a  cardiac monitor.  I personally viewed and interpreted the cardiac monitored which showed an underlying rhythm of: Sinus rhythm  Medicines ordered and prescription drug management:  I ordered medication including Medications -potassium Reevaluation of the patient after these medicines  showed that the patient feels slightly better I have reviewed the patients home medicines and have made adjustments as needed  Test Considered:       I considered IV magnesium however her mag level is normal.  Critical Interventions:         Consultations Obtained:   Problem List / ED Course:      Generalized weakness  (primary encounter diagnosis)  QT prolongation  Hypokalemia    MDM: Patient here with complaints of generalized weakness.  Suspect deconditioning.  No acute findings today.  Do not think the patient needs treatment for UTI. Patient's QT is newly prolonged at 629.  I reviewed the patient's medications do not see any specific medication causing QT prolongation.  She will need close outpatient follow-up with PCP for reevaluation.  And is undergoing a cystoscopy in 2 days. Advised to follow closely with PCP.  She may do significantly better if she has home physical therapy.  No acute findings today.  Appropriate for discharge   Dispostion:  After consideration of the diagnostic results and the patients response to treatment, I feel that the patent would benefit from discharge with close outpatient follow-up and strict return precautions..    Amount and/or Complexity of Data Reviewed Labs: ordered. Decision-making details documented in ED Course.  Risk Prescription drug management.           Final Clinical Impression(s) / ED Diagnoses Final diagnoses:  Generalized weakness  QT prolongation  Hypokalemia    Rx / DC Orders ED Discharge Orders     None         Arthor Captain, PA-C 09/10/23 1351    Gwyneth Sprout, MD 09/11/23 [phone]

## 2023-09-10 NOTE — ED Notes (Signed)
Magnesium was initiated and the IV was difficult to flush. I flushed it again and found the site was swelling. Pt began to have pain with palpation of region afterwards. IV was pulled and EDP was updated.

## 2023-09-10 NOTE — ED Notes (Signed)
ED Provider at bedside. 

## 2023-09-10 NOTE — ED Notes (Signed)
Pt advised she had a bladder biopsy in January 2024, started chemo treatment for findings, and then had another bladder biopsy in April 2024. After the first biopsy, advised she's been chronically weak and fatigued since then. Near 11 months of complaint. No recent trauma or illness. Pt advised she can walk for 20 minutes or so around the house, but then needs to lay down or rest.

## 2023-09-10 NOTE — Discharge Instructions (Addendum)
Contact a health care provider if: Your weakness does not improve or gets worse. Your weakness affects your ability to think clearly. Your weakness affects your ability to do your normal daily activities. Get help right away if: You develop sudden weakness, especially on one side of your face or body. You have chest pain. You have trouble breathing or shortness of breath. You have problems with your vision. You have trouble talking or swallowing. You have trouble standing or walking. You are light-headed or lose consciousness. These symptoms may be an emergency. Get help right away. Call 911. Do not wait to see if the symptoms will go away. Do not drive yourself to the hospital.

## 2023-09-10 NOTE — ED Notes (Signed)
Report rec'd from prev RN 

## 2023-09-12 ENCOUNTER — Ambulatory Visit (HOSPITAL_COMMUNITY): Payer: Medicare PPO | Admitting: Physician Assistant

## 2023-09-12 ENCOUNTER — Encounter (HOSPITAL_COMMUNITY)
Admission: RE | Disposition: A | Discharge status: Home / Self Care | Payer: Self-pay | Source: Ambulatory Visit | Attending: Urology

## 2023-09-12 ENCOUNTER — Encounter (HOSPITAL_COMMUNITY): Payer: Self-pay | Admitting: Urology

## 2023-09-12 ENCOUNTER — Ambulatory Visit (HOSPITAL_COMMUNITY): Payer: Medicare PPO

## 2023-09-12 ENCOUNTER — Ambulatory Visit (HOSPITAL_COMMUNITY)
Admission: RE | Admit: 2023-09-12 | Discharge: 2023-09-12 | Disposition: A | Discharge status: Home / Self Care | Payer: Medicare PPO | Source: Ambulatory Visit | Attending: Urology | Admitting: Urology

## 2023-09-12 DIAGNOSIS — E119 Type 2 diabetes mellitus without complications: Secondary | ICD-10-CM | POA: Insufficient documentation

## 2023-09-12 DIAGNOSIS — I10 Essential (primary) hypertension: Secondary | ICD-10-CM | POA: Diagnosis not present

## 2023-09-12 DIAGNOSIS — D494 Neoplasm of unspecified behavior of bladder: Secondary | ICD-10-CM | POA: Diagnosis not present

## 2023-09-12 DIAGNOSIS — Z7901 Long term (current) use of anticoagulants: Secondary | ICD-10-CM | POA: Insufficient documentation

## 2023-09-12 DIAGNOSIS — I129 Hypertensive chronic kidney disease with stage 1 through stage 4 chronic kidney disease, or unspecified chronic kidney disease: Secondary | ICD-10-CM | POA: Diagnosis not present

## 2023-09-12 DIAGNOSIS — C679 Malignant neoplasm of bladder, unspecified: Secondary | ICD-10-CM | POA: Diagnosis not present

## 2023-09-12 DIAGNOSIS — D090 Carcinoma in situ of bladder: Secondary | ICD-10-CM | POA: Insufficient documentation

## 2023-09-12 DIAGNOSIS — N1831 Chronic kidney disease, stage 3a: Secondary | ICD-10-CM | POA: Diagnosis not present

## 2023-09-12 DIAGNOSIS — N3021 Other chronic cystitis with hematuria: Secondary | ICD-10-CM | POA: Insufficient documentation

## 2023-09-12 DIAGNOSIS — N3941 Urge incontinence: Secondary | ICD-10-CM | POA: Insufficient documentation

## 2023-09-12 DIAGNOSIS — Z794 Long term (current) use of insulin: Secondary | ICD-10-CM | POA: Insufficient documentation

## 2023-09-12 DIAGNOSIS — Z87891 Personal history of nicotine dependence: Secondary | ICD-10-CM | POA: Diagnosis not present

## 2023-09-12 DIAGNOSIS — E1122 Type 2 diabetes mellitus with diabetic chronic kidney disease: Secondary | ICD-10-CM | POA: Diagnosis not present

## 2023-09-12 HISTORY — PX: CYSTOSCOPY WITH BIOPSY: SHX5122

## 2023-09-12 LAB — GLUCOSE, CAPILLARY
Glucose-Capillary: 172 mg/dL — ABNORMAL HIGH (ref 70–99)
Glucose-Capillary: 75 mg/dL (ref 70–99)
Glucose-Capillary: 97 mg/dL (ref 70–99)

## 2023-09-12 SURGERY — CYSTOSCOPY, WITH BIOPSY
Anesthesia: General | Laterality: Bilateral

## 2023-09-12 MED ORDER — ROCURONIUM BROMIDE 10 MG/ML (PF) SYRINGE
PREFILLED_SYRINGE | INTRAVENOUS | Status: DC | PRN
  Administered 2023-09-12: 10 mg via INTRAVENOUS
  Administered 2023-09-12: 30 mg via INTRAVENOUS

## 2023-09-12 MED ORDER — CHLORHEXIDINE GLUCONATE 0.12 % MT SOLN: 15.0000 mL | Freq: Once | OROMUCOSAL | Status: DC

## 2023-09-12 MED ORDER — FENTANYL CITRATE (PF) 100 MCG/2ML IJ SOLN: INTRAMUSCULAR | Status: DC | PRN

## 2023-09-12 MED ORDER — PHENYLEPHRINE 80 MCG/ML (10ML) SYRINGE FOR IV PUSH (FOR BLOOD PRESSURE SUPPORT)
PREFILLED_SYRINGE | INTRAVENOUS | Status: DC | PRN
  Administered 2023-09-12: 80 ug via INTRAVENOUS
  Administered 2023-09-12: 120 ug via INTRAVENOUS

## 2023-09-12 MED ORDER — STERILE WATER FOR IRRIGATION IR SOLN
Status: DC | PRN
  Administered 2023-09-12: 3000 mL

## 2023-09-12 MED ORDER — FENTANYL CITRATE (PF) 100 MCG/2ML IJ SOLN
INTRAMUSCULAR | Status: AC
  Filled 2023-09-12: qty 2

## 2023-09-12 MED ORDER — FENTANYL CITRATE (PF) 100 MCG/2ML IJ SOLN
INTRAMUSCULAR | Status: DC | PRN
  Administered 2023-09-12: 25 ug via INTRAVENOUS
  Administered 2023-09-12: 50 ug via INTRAVENOUS

## 2023-09-12 MED ORDER — SUGAMMADEX SODIUM 200 MG/2ML IV SOLN
INTRAVENOUS | Status: DC | PRN
  Administered 2023-09-12: 110 mg via INTRAVENOUS

## 2023-09-12 MED ORDER — INSULIN ASPART 100 UNIT/ML IJ SOLN
0.0000 [IU] | INTRAMUSCULAR | Status: DC | PRN
Start: 2023-09-12 — End: 2023-09-12

## 2023-09-12 MED ORDER — ORAL CARE MOUTH RINSE: 15.0000 mL | Freq: Once | OROMUCOSAL | Status: AC

## 2023-09-12 MED ORDER — ACETAMINOPHEN 500 MG PO TABS
1000.0000 mg | ORAL_TABLET | Freq: Once | ORAL | Status: AC
  Administered 2023-09-12: 1000 mg via ORAL
  Filled 2023-09-12: qty 2

## 2023-09-12 MED ORDER — LACTATED RINGERS IV SOLN: INTRAVENOUS | Status: DC

## 2023-09-12 MED ORDER — ORAL CARE MOUTH RINSE: 15.0000 mL | Freq: Once | OROMUCOSAL | Status: DC

## 2023-09-12 MED ORDER — CEFAZOLIN SODIUM-DEXTROSE 2-4 GM/100ML-% IV SOLN
2.0000 g | INTRAVENOUS | Status: AC
  Administered 2023-09-12: 2 g via INTRAVENOUS
  Filled 2023-09-12: qty 100

## 2023-09-12 MED ORDER — ONDANSETRON HCL 4 MG/2ML IJ SOLN
INTRAMUSCULAR | Status: DC | PRN
  Administered 2023-09-12: 4 mg via INTRAVENOUS

## 2023-09-12 MED ORDER — CHLORHEXIDINE GLUCONATE 0.12 % MT SOLN
15.0000 mL | Freq: Once | OROMUCOSAL | Status: AC
  Administered 2023-09-12: 15 mL via OROMUCOSAL

## 2023-09-12 MED ORDER — SODIUM CHLORIDE 0.9 % IR SOLN
Status: DC | PRN
  Administered 2023-09-12: 3000 mL via INTRAVESICAL

## 2023-09-12 MED ORDER — PROPOFOL 10 MG/ML IV BOLUS
INTRAVENOUS | Status: DC | PRN
  Administered 2023-09-12: 70 mg via INTRAVENOUS
  Administered 2023-09-12: 30 mg via INTRAVENOUS

## 2023-09-12 MED ORDER — EPHEDRINE SULFATE-NACL 50-0.9 MG/10ML-% IV SOSY
PREFILLED_SYRINGE | INTRAVENOUS | Status: DC | PRN
  Administered 2023-09-12: 5 mg via INTRAVENOUS
  Administered 2023-09-12: 10 mg via INTRAVENOUS

## 2023-09-12 MED ORDER — DEXAMETHASONE SODIUM PHOSPHATE 10 MG/ML IJ SOLN
INTRAMUSCULAR | Status: DC | PRN
  Administered 2023-09-12: 4 mg via INTRAVENOUS

## 2023-09-12 MED ORDER — IOHEXOL 300 MG/ML  SOLN
INTRAMUSCULAR | Status: DC | PRN
  Administered 2023-09-12: 29 mL

## 2023-09-12 MED ORDER — LIDOCAINE 2% (20 MG/ML) 5 ML SYRINGE
INTRAMUSCULAR | Status: DC | PRN
  Administered 2023-09-12: 60 mg via INTRAVENOUS

## 2023-09-12 SURGICAL SUPPLY — 19 items
BAG URINE DRAIN 2000ML AR STRL (UROLOGICAL SUPPLIES) IMPLANT
BAG URO CATCHER STRL LF (MISCELLANEOUS) ×3 IMPLANT
CATH FOLEY 2WAY SLVR 5CC 18FR (CATHETERS) IMPLANT
CATH URETL OPEN END 6FR 70 (CATHETERS) ×1 IMPLANT
DRAPE FOOT SWITCH (DRAPES) ×3 IMPLANT
ELECT REM PT RETURN 15FT ADLT (MISCELLANEOUS) ×3 IMPLANT
GLOVE BIO SURGEON STRL SZ7.5 (GLOVE) ×3 IMPLANT
GOWN STRL REUS W/ TWL XL LVL3 (GOWN DISPOSABLE) ×3 IMPLANT
GUIDEWIRE STR DUAL SENSOR (WIRE) ×1 IMPLANT
KIT TURNOVER KIT A (KITS) IMPLANT
LOOP CUT BIPOLAR 24F LRG (ELECTROSURGICAL) IMPLANT
MANIFOLD NEPTUNE II (INSTRUMENTS) ×3 IMPLANT
PACK CYSTO (CUSTOM PROCEDURE TRAY) ×3 IMPLANT
PAD TELFA 2X3 NADH STRL (GAUZE/BANDAGES/DRESSINGS) ×1 IMPLANT
PLUG CATH AND CAP STRL 200 (CATHETERS) IMPLANT
SYR TOOMEY IRRIG 70ML (MISCELLANEOUS)
SYRINGE TOOMEY IRRIG 70ML (MISCELLANEOUS) IMPLANT
TUBING CONNECTING 10 (TUBING) ×3 IMPLANT
TUBING UROLOGY SET (TUBING) ×3 IMPLANT

## 2023-09-12 NOTE — Anesthesia Postprocedure Evaluation (Signed)
Anesthesia Post Note  Patient: [name]  Procedure(s) Performed: CYSTOSCOPY WITH BIOPSY FULGURATION  BILATERAL RETROGRADE PYELOGRAM, BILATERAL DIAGNOSTIC URETEROSCOPY (Bilateral)     Patient location during evaluation: PACU Anesthesia Type: General Level of consciousness: awake Pain management: pain level controlled Vital Signs Assessment: post-procedure vital signs reviewed and stable Respiratory status: spontaneous breathing, nonlabored ventilation and respiratory function stable Cardiovascular status: blood pressure returned to baseline and stable Postop Assessment: no apparent nausea or vomiting Anesthetic complications: no   No notable events documented.  Last Vitals:  Vitals:   09/12/23 1030 09/12/23 1045  BP: (!) 139/57 (!) 130/57  Pulse: 67 68  Resp: (!) 28 19  Temp:  (!) 36.4 C  SpO2: 94% 96%    Last Pain:  Vitals:   09/12/23 1045  TempSrc:   PainSc: 0-No pain                 [name]

## 2023-09-12 NOTE — Anesthesia Procedure Notes (Signed)
Procedure Name: Intubation Date/Time: 09/12/2023 9:06 AM  Performed by: Sindy Guadeloupe, CRNAPre-anesthesia Checklist: Patient identified, Emergency Drugs available, Suction available, Patient being monitored and Timeout performed Patient Re-evaluated:Patient Re-evaluated prior to induction Oxygen Delivery Method: Circle system utilized Preoxygenation: Pre-oxygenation with 100% oxygen Induction Type: IV induction Ventilation: Mask ventilation without difficulty Laryngoscope Size: Mac and 4 Grade View: Grade I Tube type: Oral Tube size: 7.0 mm Number of attempts: 1 Airway Equipment and Method: Stylet Placement Confirmation: ETT inserted through vocal cords under direct vision, positive ETCO2 and breath sounds checked- equal and bilateral Secured at: 21 cm Tube secured with: Tape Dental Injury: Teeth and Oropharynx as per pre-operative assessment

## 2023-09-12 NOTE — Transfer of Care (Signed)
Immediate Anesthesia Transfer of Care Note  Patient: [name]  Procedure(s) Performed: CYSTOSCOPY WITH BIOPSY FULGURATION  BILATERAL RETROGRADE PYELOGRAM, BILATERAL DIAGNOSTIC URETEROSCOPY (Bilateral)  Patient Location: PACU  Anesthesia Type:General  Level of Consciousness: awake, drowsy, and patient cooperative  Airway & Oxygen Therapy: Patient Spontanous Breathing and Patient connected to face mask oxygen  Post-op Assessment: Report given to RN and Post -op Vital signs reviewed and stable  Post vital signs: Reviewed and stable  Last Vitals:  Vitals Value Taken Time  BP 151/62 09/12/23 1000  Temp    Pulse 67 09/12/23 1002  Resp 11 09/12/23 1002  SpO2 100 % 09/12/23 1002  Vitals shown include unfiled device data.  Last Pain:  Vitals:   09/12/23 0824  TempSrc:   PainSc: 0-No pain         Complications: No notable events documented.

## 2023-09-12 NOTE — Anesthesia Preprocedure Evaluation (Addendum)
Anesthesia Evaluation  Patient identified by MRN, date of birth, ID band Patient awake    Reviewed: Allergy & Precautions, NPO status , Patient's Chart, lab work & pertinent test results  Airway Mallampati: II  TM Distance: >3 FB Neck ROM: Full    Dental  (+) Missing,    Pulmonary former smoker   Pulmonary exam normal        Cardiovascular hypertension, Normal cardiovascular exam+ dysrhythmias Atrial Fibrillation      Neuro/Psych negative neurological ROS  negative psych ROS   GI/Hepatic Neg liver ROS, PUD,,,  Endo/Other  diabetes, Insulin DependentHypothyroidism    Renal/GU negative Renal ROS     Musculoskeletal  (+) Arthritis ,    Abdominal   Peds  Hematology  (+) Blood dyscrasia (Eliquis)   Anesthesia Other Findings BLADDER TUMOR  Reproductive/Obstetrics                             Anesthesia Physical Anesthesia Plan  ASA: 3  Anesthesia Plan: General   Post-op Pain Management:    Induction: Intravenous  PONV Risk Score and Plan: 3 and Ondansetron, Dexamethasone and Treatment may vary due to age or medical condition  Airway Management Planned: Oral ETT  Additional Equipment:   Intra-op Plan:   Post-operative Plan: Extubation in OR  Informed Consent: I have reviewed the patients History and Physical, chart, labs and discussed the procedure including the risks, benefits and alternatives for the proposed anesthesia with the patient or authorized representative who has indicated his/her understanding and acceptance.     Dental advisory given  Plan Discussed with: CRNA  Anesthesia Plan Comments:         Anesthesia Quick Evaluation

## 2023-09-12 NOTE — Op Note (Signed)
Operative Note  Preoperative diagnosis:  1.  Bladder tumor  Postoperative diagnosis: 1.  Bladder tumor  Procedure(s): 1.  Cystoscopy with bladder biopsy and fulguration of medium bladder tumor 2.  Bilateral retrograde pyelogram 3.  Bilateral diagnostic ureteroscopy  Surgeon: Modena Slater, MD  Assistants: None  Anesthesia: General  Complications: None immediate  EBL: Minimal  Specimens: 1.  Bladder biopsy x 2  Drains/Catheters: 1.  None  Intraoperative findings: 1.  Normal urethra 2.  Bladder mucosa with an area of erythema on posterior bladder wall towards the dome that measured about 2 cm.  This was biopsied and then fulgurated.  She also had an area of erythema on the right lateral wall that measured about 1 cm that was biopsied and fulgurated.  No papillary bladder tumors  3.  Retrograde pyelogram revealed filling defects in the distal ureters bilaterally.  There is no hydronephrosis.  There were some air bubbles on the right in the renal pelvis but no fixed filling defect proximally.  4.  Diagnostic ureteroscopy of both distal ureters revealed no tumors.  There appeared to be some annular narrowing that was easily bypassed with the scope that caused the appearance of filling defects in the distal ureter bilaterally.  Indication: 88 year old female with a history of CIS of the bladder presents for the previously mentioned operation after being found to have areas of erythema on the bladder.  Description of procedure:  The patient was identified and consent was obtained.  The patient was taken to the operating room and placed in the supine position.  The patient was placed under general anesthesia.  Perioperative antibiotics were administered.  The patient was placed in dorsal lithotomy.  Patient was prepped and draped in a standard sterile fashion and a timeout was performed.  A 21 French rigid cystoscope was advanced into the urethra and into the bladder.  Complete  cystoscopy was performed with findings noted above.  Cold cup biopsies were used to biopsy the areas of interest which were passed off for specimen.  I then used a Bugbee to fulgurate the abnormal areas.  Total area of fulguration was 2 cm on the posterior bladder wall and about 1 cm on the right lateral wall.  There was no active bleeding noted.  There was no evidence of any perforation.  I intubated the right ureteral orifice with an open-ended ureteral catheter and a retrograde pyelogram was performed with findings noted above.  I did note there to be some abnormality in the distal third of the ureter that was concerning for possible filling defects.  I performed a retrograde pyelogram on the left in the same fashion and again noted what appeared to be filling defects in the distal third of the left ureter as well.  Proximal ureters were without filling defect or hydronephrosis.  I intubated the right ureteral orifice with a sensor wire and advanced that up to the kidney under fluoroscopic guidance.  A semirigid ureteroscope was advanced alongside the wire up to the mid ureter.  I noted there to be some annular narrowing and several areas in the distal ureter that was easily bypassed with the scope but no tumors or erythema.  No areas concerning for tumor.  I then advanced the scope up the left ureter carefully to the mid ureter and noted the same findings on the left again with some areas of annular narrowing that was easily bypassed with the scope.  There were no tumors or concerning spots.  I withdrew the  scope and the wire.  I readvanced the cystoscope and emptied the bladder.  This concluded the operation.  Patient tolerated the procedure well was stable postoperatively.  Plan: Follow-up in 1 week for pathology review

## 2023-09-12 NOTE — Discharge Instructions (Addendum)
Transurethral Resection of Bladder Tumor (TURBT) or Bladder Biopsy   Restart Eliquis on Wednesday morning as long as you are not having any bleeding  Definition:  Transurethral Resection of the Bladder Tumor is a surgical procedure used to diagnose and remove tumors within the bladder. TURBT is the most common treatment for early stage bladder cancer.  General instructions:     Your recent bladder surgery requires very little post hospital care but some definite precautions.  Despite the fact that no skin incisions were used, the area around the bladder incisions are raw and covered with scabs to promote healing and prevent bleeding. Certain precautions are needed to insure that the scabs are not disturbed over the next 2-4 weeks while the healing proceeds.  Because the raw surface inside your bladder and the irritating effects of urine you may expect frequency of urination and/or urgency (a stronger desire to urinate) and perhaps even getting up at night more often. This will usually resolve or improve slowly over the healing period. You may see some blood in your urine over the first 6 weeks. Do not be alarmed, even if the urine was clear for a while. Get off your feet and drink lots of fluids until clearing occurs. If you start to pass clots or don't improve call us.  Diet:  You may return to your normal diet immediately. Because of the raw surface of your bladder, alcohol, spicy foods, foods high in acid and drinks with caffeine may cause irritation or frequency and should be used in moderation. To keep your urine flowing freely and avoid constipation, drink plenty of fluids during the day (8-10 glasses). Tip: Avoid cranberry juice because it is very acidic.  Activity:  Your physical activity doesn't need to be restricted. However, if you are very active, you may see some blood in the urine. We suggest that you reduce your activity under the circumstances until the bleeding has  stopped.  Bowels:  It is important to keep your bowels regular during the postoperative period. Straining with bowel movements can cause bleeding. A bowel movement every other day is reasonable. Use a mild laxative if needed, such as milk of magnesia 2-3 tablespoons, or 2 Dulcolax tablets. Call if you continue to have problems. If you had been taking narcotics for pain, before, during or after your surgery, you may be constipated. Take a laxative if necessary.    Medication:  You should resume your pre-surgery medications unless told not to. In addition you may be given an antibiotic to prevent or treat infection. Antibiotics are not always necessary. All medication should be taken as prescribed until the bottles are finished unless you are having an unusual reaction to one of the drugs.

## 2023-09-12 NOTE — H&P (Signed)
CC/HPI: I was consulted to assist the patient's microscopic hematuria. She smoked 30 years ago. She is on Eliquis but no aspirin   She has urge incontinence and sometimes wears 2 or less pads a day. No bedwetting or stress incontinence. She voids every 2 hours gets up 5 or 6 times a night with a good flow   She is an insulin-dependent diabetic. She has had a hysterectomy.   Patient has microscopic hematuria. She has urge incontinence frequency and significant nocturia. return with CT scan and for cystoscopy deferred for insurance reasons. Call if urine culture positive. I spent many minutes repeating the work-up of microscopic hematuria and explaining the differential diagnosis. The conversation was quite circular. I am not certain how else I could explain things better. She also would like to have her frequency day and night and urgency helped and we will discuss this next time. She understands that her urinalysis today was negative but if if its been positive in the past that should not be ignored especially in someone with a smoking history   Patient complains of a little bit of burning since some sort of biopsy many months ago in the hospital. The last hospital note was for poorly controlled diabetes. She had a positive urine culture in April 2023. CT scan demonstrated pancreatic abnormality discussed with patient. The CT scan will be sent to primary care and flag. The CT scan also demonstrated cystitis.   Urine positive   On cystoscopy patient had diffuse erythema. It was in patches. There is a few white flecks. Urine was not that cloudy. Urine was sent for culture. Pictures were taken and kept   The patient likely has chronic cystitis based upon cystoscopic findings, CT scan and at least one positive culture in the last several months. She does have a smoking history. I called in ciprofloxacin 250 mg twice a day for 7 days. I will then start her on trimethoprim 100 mg 30x11. I will repeat her  cystoscopy in 6 weeks. If she still has erythema she understands that she would need a biopsy from one of my partners and she is aware of this.. I am hoping that once a day antibiotic will help down regulate her overactive bladder. Call if culture differs   Based upon the mucosal enhancement and CT findings and degree of erythema today the cystitis may still persist at 6 weeks. Having said that I will recommend biopsy if it is still present to rule out carcinoma in situ   Send note with CT scan report to primary care about pancreas   Patient has not been taking her trimethoprim. She states she was having less burning on it but having a little bit of burning today at the end of urination only. I did not want to get a false positive. I sent the urine for culture and put her back on trimethoprim. Repeat cystoscopy in about 6 weeks. Call if culture is positive   I have a picture of the last cystoscopic findings. Last culture was negative   Co-pay for visit will be returned. She states she was going less frequently day and night on the trimethoprim.   Today  Frequency stable. Last culture negative.  Patient says she has little bit of burning at the end of urination but overall improved on trimethoprim.  On cystoscopy patient had diffuse erythema. She had white flecks in the urine. Some of the mucosa almost looked edematous. She had the urinate quite soon upon bladder filling.  The findings were in keeping with chronic cystitis and it was very diffuse  2 more pictures were taken and will be placed in the medical record   Picture was drawn. Patient understands she likely has chronic cystitis and that a biopsy would demonstrate inflammation. The differential diagnosis of carcinoma in situ was discussed. The role of referral for cystoscopy and biopsy was discussed. I want her to stay on trimethoprim.   I think she is willing to proceed with a bladder biopsy for safety reasons. She may be on a blood thinner  but it was not written in her medical record. I will see her in 6 months on trimethoprim. She understands a bladder biopsy will not change the natural history of her burning at the end of urination which she tolerates well. Call if culture positive   10/13/2022  Patient with a history of chronic cystitis. Has persistent erythema on cystoscopy and was referred to me for bladder biopsy. She denies any gross hematuria or dysuria. She maintains on trimethoprim UTI prophylaxis.   11/26/2022  Patient status post transurethral resection of bladder tumor with bilateral retrograde pyelogram. She had extensive erythema throughout the bladder which was resected and/or fulgurated. 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   03/09/2023  Patient did well with BCG. She presents for surveillance cystoscopy.   04/15/2023  Patient underwent a TURBT. This revealed no evidence of malignancy. Revealed inflammation. She is doing well.   07/20/2023  Patient presents today for surveillance cystoscopy. She went to the hospital on 10/1 for progressive weakness. Was treated for UTI with Keflex. Urine culture showed mixed growth. She denies any hematuria or dysuria. Unable to leave urine sample today.   09/12/2023 Patient presents today for hospital bladder biopsy and fulguration, possible TURBT, bilateral retrograde pyelogram.  Blood sugar 172 today.  She has been working on that and gotten better control.      ALLERGIES: No Allergies    MEDICATIONS: Levothyroxine Sodium 88 mcg tablet  Nitrofurantoin Mono-Macro 100 mg capsule 1 capsule PO Q HS  Trimethoprim 100 mg tablet 1 tablet PO Daily  Amiodarone Hcl 200 mg tablet  Eliquis 2.5 mg tablet  Klor-Con  Novolog  Pantoprazole Sodium 40 mg tablet, delayed release  Tiadylt Er 180 mg capsule, extended release 24hr  Trulicity      Notes:  .   GU PSH: Bladder Instill AntiCA Agent - 02/24/2023, 02/17/2023, 02/02/2023, 01/26/2023,  01/05/2023, 12/29/2022, 12/21/2022 Cystoscopy - 03/09/2023, 09/22/2022, 06/16/2022 Cystoscopy TURBT >5 cm - 11/19/2022 Locm 300-399Mg /Ml Iodine,1Ml - 05/18/2022       PSH Notes: Gall Bladder surgery   NON-GU PSH: Hysterectomy Visit Complexity (formerly GPC1X) - 04/15/2023, 03/30/2023         GU PMH: Bladder Cancer overlapping sites - 04/15/2023, - 03/30/2023, - 03/09/2023, - 02/24/2023, - 02/17/2023, - 02/02/2023, - 01/26/2023, - 01/12/2023, - 01/05/2023, - 12/29/2022, - 12/21/2022, - 11/26/2022 Microscopic hematuria - 03/30/2023, - 10/13/2022, - 09/22/2022, - 08/04/2022, - 06/16/2022, - 05/18/2022, - 04/23/2022 CIS of the bladder - 03/09/2023, - 11/26/2022 Chronic cystitis (w/o hematuria) - 10/13/2022 Urge incontinence - 09/22/2022, - 08/04/2022, - 04/23/2022 Urinary Tract Inf, Unspec site - 09/22/2022 Urinary Frequency - 06/16/2022, - 04/23/2022 Nocturia - 04/23/2022      PMH Notes:  1898-10-11 00:00:00 - Note: Normal Routine History And Physical Senior Citizen (65-80)   NON-GU PMH: Atrial Fibrillation Cardiac murmur, unspecified GERD Hypercholesterolemia Hypertension Hyperthyroidism Other heart failure    FAMILY HISTORY: Cancer - Daughter Depression - Daughter  Heart problem - Daughter Kidney Stones - Daughter Prostate Cancer - Brother renal failure - Aunt   SOCIAL HISTORY: Marital Status: Widowed    REVIEW OF SYSTEMS:     GU Review Female:  Patient denies frequent urination, hard to postpone urination, burning /pain with urination, get up at night to urinate, leakage of urine, stream starts and stops, trouble starting your stream, have to strain to urinate, and being pregnant.    Gastrointestinal (Upper):  Patient denies nausea, vomiting, and indigestion/ heartburn.    Gastrointestinal (Lower):  Patient denies diarrhea and constipation.    Constitutional:  Patient denies fever, night sweats, weight loss, and fatigue.    Skin:  Patient denies skin rash/ lesion and itching.    Eyes:  Patient denies blurred  vision and double vision.    Ears/ Nose/ Throat:  Patient denies sore throat and sinus problems.    Hematologic/Lymphatic:  Patient denies easy bruising and swollen glands.    Cardiovascular:  Patient denies leg swelling and chest pains.    Respiratory:  Patient denies cough and shortness of breath.    Endocrine:  Patient denies excessive thirst.    Musculoskeletal:  Patient denies back pain and joint pain.    Neurological:  Patient denies headaches and dizziness.    Psychologic:  Patient denies depression and anxiety.    VITAL SIGNS: None     MULTI-SYSTEM PHYSICAL EXAMINATION:      Constitutional: Well-nourished. No physical deformities. Normally developed. Good grooming.     Gastrointestinal: No mass, no tenderness, no rigidity, non obese abdomen.     Eyes: Normal conjunctivae. Normal eyelids.     Musculoskeletal: Normal gait and station of head and neck.             ASSESSMENT:     ICD-10 Details  1 GU:  Bladder Cancer overlapping sites - C67.8 Chronic, Stable  2  CIS of the bladder - D09.0 Chronic, Stable   PLAN:   Document  Letter(s):  Created for Patient: Clinical Summary   Notes:  Plan for cystoscopy with bladder biopsy and fulguration, possible TURBT. Also plan for bilateral retrograde pyelogram.

## 2023-09-13 ENCOUNTER — Encounter (HOSPITAL_COMMUNITY): Payer: Self-pay | Admitting: Urology

## 2023-09-13 DIAGNOSIS — E1065 Type 1 diabetes mellitus with hyperglycemia: Secondary | ICD-10-CM | POA: Diagnosis not present

## 2023-09-13 LAB — SURGICAL PATHOLOGY

## 2023-09-16 ENCOUNTER — Emergency Department (HOSPITAL_BASED_OUTPATIENT_CLINIC_OR_DEPARTMENT_OTHER)
Admission: EM | Admit: 2023-09-16 | Discharge: 2023-09-16 | Disposition: A | Discharge status: Home / Self Care | Payer: Medicare PPO | Attending: Emergency Medicine | Admitting: Emergency Medicine

## 2023-09-16 ENCOUNTER — Encounter (HOSPITAL_BASED_OUTPATIENT_CLINIC_OR_DEPARTMENT_OTHER): Payer: Self-pay

## 2023-09-16 ENCOUNTER — Emergency Department (HOSPITAL_BASED_OUTPATIENT_CLINIC_OR_DEPARTMENT_OTHER): Payer: Medicare PPO

## 2023-09-16 ENCOUNTER — Other Ambulatory Visit: Payer: Self-pay

## 2023-09-16 DIAGNOSIS — Z794 Long term (current) use of insulin: Secondary | ICD-10-CM | POA: Diagnosis not present

## 2023-09-16 DIAGNOSIS — Z7901 Long term (current) use of anticoagulants: Secondary | ICD-10-CM | POA: Diagnosis not present

## 2023-09-16 DIAGNOSIS — R531 Weakness: Secondary | ICD-10-CM

## 2023-09-16 DIAGNOSIS — E119 Type 2 diabetes mellitus without complications: Secondary | ICD-10-CM | POA: Insufficient documentation

## 2023-09-16 DIAGNOSIS — K8689 Other specified diseases of pancreas: Secondary | ICD-10-CM | POA: Diagnosis not present

## 2023-09-16 DIAGNOSIS — N3000 Acute cystitis without hematuria: Secondary | ICD-10-CM | POA: Diagnosis not present

## 2023-09-16 DIAGNOSIS — N3289 Other specified disorders of bladder: Secondary | ICD-10-CM | POA: Diagnosis not present

## 2023-09-16 DIAGNOSIS — R410 Disorientation, unspecified: Secondary | ICD-10-CM | POA: Diagnosis not present

## 2023-09-16 LAB — CBC WITH DIFFERENTIAL/PLATELET
Abs Immature Granulocytes: 0.01 10*3/uL (ref 0.00–0.07)
Basophils Absolute: 0.1 10*3/uL (ref 0.0–0.1)
Basophils Relative: 1 %
Eosinophils Absolute: 0.1 10*3/uL (ref 0.0–0.5)
Eosinophils Relative: 1 %
HCT: 34.6 % — ABNORMAL LOW (ref 36.0–46.0)
Hemoglobin: 11.2 g/dL — ABNORMAL LOW (ref 12.0–15.0)
Immature Granulocytes: 0 %
Lymphocytes Relative: 18 %
Lymphs Abs: 1.1 10*3/uL (ref 0.7–4.0)
MCH: 28.4 pg (ref 26.0–34.0)
MCHC: 32.4 g/dL (ref 30.0–36.0)
MCV: 87.6 fL (ref 80.0–100.0)
Monocytes Absolute: 0.5 10*3/uL (ref 0.1–1.0)
Monocytes Relative: 8 %
Neutro Abs: 4.3 10*3/uL (ref 1.7–7.7)
Neutrophils Relative %: 72 %
Platelets: 227 10*3/uL (ref 150–400)
RBC: 3.95 MIL/uL (ref 3.87–5.11)
RDW: 14 % (ref 11.5–15.5)
WBC: 6 10*3/uL (ref 4.0–10.5)
nRBC: 0 % (ref 0.0–0.2)

## 2023-09-16 LAB — URINALYSIS, ROUTINE W REFLEX MICROSCOPIC
Glucose, UA: >=500 mg/dL — AB
Ketones, ur: 40 mg/dL — AB
Nitrite: NEGATIVE
Protein, ur: 100 mg/dL — AB
Specific Gravity, Urine: 1.02 (ref 1.005–1.030)
pH: 5.5 (ref 5.0–8.0)

## 2023-09-16 LAB — COMPREHENSIVE METABOLIC PANEL
ALT: 35 U/L (ref 0–44)
AST: 25 U/L (ref 15–41)
Albumin: 3.1 g/dL — ABNORMAL LOW (ref 3.5–5.0)
Alkaline Phosphatase: 86 U/L (ref 38–126)
Anion gap: 14 (ref 5–15)
BUN: 31 mg/dL — ABNORMAL HIGH (ref 8–23)
CO2: 20 mmol/L — ABNORMAL LOW (ref 22–32)
Calcium: 8.6 mg/dL — ABNORMAL LOW (ref 8.9–10.3)
Chloride: 95 mmol/L — ABNORMAL LOW (ref 98–111)
Creatinine, Ser: 1.5 mg/dL — ABNORMAL HIGH (ref 0.44–1.00)
GFR, Estimated: 34 mL/min — ABNORMAL LOW (ref >60)
Glucose, Bld: 284 mg/dL — ABNORMAL HIGH (ref 70–99)
Potassium: 4.2 mmol/L (ref 3.5–5.1)
Sodium: 129 mmol/L — ABNORMAL LOW (ref 135–145)
Total Bilirubin: 1.4 mg/dL — ABNORMAL HIGH (ref <1.2)
Total Protein: 6.5 g/dL (ref 6.5–8.1)

## 2023-09-16 LAB — LIPASE, BLOOD: Lipase: 21 U/L (ref 11–51)

## 2023-09-16 LAB — URINALYSIS, MICROSCOPIC (REFLEX): WBC, UA: >50 WBC/hpf (ref 0–5)

## 2023-09-16 LAB — CBG MONITORING, ED: Glucose-Capillary: 240 mg/dL — ABNORMAL HIGH (ref 70–99)

## 2023-09-16 MED ORDER — SODIUM CHLORIDE 0.9 % IV BOLUS
1000.0000 mL | Freq: Once | INTRAVENOUS | Status: DC
Start: 2023-09-16 — End: 2023-09-16

## 2023-09-16 MED ORDER — CEPHALEXIN 500 MG PO CAPS: 500.0000 mg | ORAL_CAPSULE | Freq: Two times a day (BID) | ORAL | 0 refills | Status: AC

## 2023-09-16 MED ORDER — CEPHALEXIN 250 MG PO CAPS
500.0000 mg | ORAL_CAPSULE | Freq: Once | ORAL | Status: AC
  Administered 2023-09-16: 500 mg via ORAL
  Filled 2023-09-16: qty 2

## 2023-09-16 MED ORDER — SODIUM CHLORIDE 0.9 % IV BOLUS
1000.0000 mL | Freq: Once | INTRAVENOUS | Status: AC
  Administered 2023-09-16: 1000 mL via INTRAVENOUS

## 2023-09-16 NOTE — ED Provider Notes (Signed)
[place] [facility] AT [facility] Provider Note   CSN: [id number] Arrival date & time: 09/16/23  1643     History  Chief Complaint  Patient presents with   Weakness    [name] is a 88 y.o. female.  Patient here with ongoing generalized weakness and dehydration concerns.  She denies any cough or sputum production.  She had a bladder biopsy about 5 days ago.  She has been doing well.  She did have some episode of vomiting once the day of the surgery.  And some lower abdominal discomfort later that night but has mostly improved throughout the last few days.  She denies any fever or chills.  Denies any constipation or diarrhea.  She denies any pain at urination.  Blood sugars been slightly erratic sometimes that will be in the 300s sometimes it will be in the 100s.  Patient has a history of hypertension, high cholesterol.  Denies any chest pain shortness of breath or stroke symptoms.  She has had decreased appetite now for months.  The history is provided by the patient.       Home Medications Prior to Admission medications   Medication Sig Start Date End Date Taking? Authorizing Provider  cephALEXin (KEFLEX) 500 MG capsule Take 1 capsule (500 mg total) by mouth 2 (two) times daily for 5 days. 09/16/23 09/21/23 Yes Mischele Detter, DO  acetaminophen (TYLENOL) 500 MG tablet Take 500 mg by mouth every 6 (six) hours as needed for moderate pain (pain score 4-6). Patient not taking: Reported on 09/07/2023    [provider]  amiodarone (PACERONE) 100 MG tablet Take 1 tablet (100 mg total) by mouth daily. Patient not taking: Reported on 09/07/2023 06/15/23   Carlos Levering, NP  amiodarone (PACERONE) 200 MG tablet TAKE 1 TABLET BY MOUTH EVERY DAY 08/09/23   Hilty, Lisette Abu, MD  apixaban (ELIQUIS) 2.5 MG TABS tablet TAKE 1 TABLET BY MOUTH TWICE A DAY 02/06/23   Hilty, Lisette Abu, MD  Ascorbic Acid (VITAMIN C) 100 MG tablet Take 100 mg by mouth daily.     [provider]  Calcium Polycarbophil (FIBER-CAPS PO) Take 3-5 capsules by mouth as needed (constipation).    [provider]  furosemide (LASIX) 20 MG tablet Take 1 tablet (20 mg total) by mouth daily as needed for fluid or edema. 03/24/23   Carlos Levering, NP  insulin aspart (NOVOLOG FLEXPEN) 100 UNIT/ML FlexPen Inject 8 Units into the skin 3 (three) times daily with meals. Patient taking differently: Inject 11 Units into the skin 3 (three) times daily with meals. 08/04/23   Marinda Elk, MD  insulin glargine, 2 Unit Dial, (TOUJEO MAX SOLOSTAR) 300 UNIT/ML Solostar Pen Inject 14 Units into the skin at bedtime. Patient taking differently: Inject 12 Units into the skin at bedtime. 08/04/23   Marinda Elk, MD  KLOR-CON M20 20 MEQ tablet Take 20 mEq by mouth daily. 03/22/23   [provider]  levothyroxine (SYNTHROID) 100 MCG tablet Take 100 mcg by mouth daily before breakfast.    [provider]  levothyroxine (SYNTHROID) 112 MCG tablet Take 112 mcg by mouth daily before breakfast. Patient not taking: Reported on 09/07/2023    [provider]  mirtazapine (REMERON) 30 MG tablet Take 30 mg by mouth at bedtime as needed (sleep).    [provider]  nitrofurantoin, macrocrystal-monohydrate, (MACROBID) 100 MG capsule Take 100 mg by mouth at bedtime. 07/13/23   [provider]  pantoprazole (PROTONIX)  40 MG tablet Take 1 tablet (40 mg total) by mouth daily at 6 (six) AM. 02/09/22   Osvaldo Shipper, MD  TIADYLT ER 180 MG 24 hr capsule TAKE 1 CAPSULE BY MOUTH EVERY DAY 11/01/22   Hilty, Lisette Abu, MD  trimethoprim (TRIMPEX) 100 MG tablet Take 100 mg by mouth daily. 10/27/22   [provider]      Allergies    Patient has no known allergies.    Review of Systems   Review of Systems  Physical Exam Updated Vital Signs BP (!) 129/55   Pulse 78   Temp 97.8 F (36.6 C) (Oral)   Resp (!) 26   Ht 5\' 2"  (1.575 m)    Wt 51 kg   SpO2 98%   BMI 20.56 kg/m  Physical Exam Vitals and nursing note reviewed.  Constitutional:      General: She is not in acute distress.    Appearance: She is well-developed. She is not ill-appearing.  HENT:     Head: Normocephalic and atraumatic.     Nose: Nose normal.     Mouth/Throat:     Mouth: Mucous membranes are moist.  Eyes:     Extraocular Movements: Extraocular movements intact.     Conjunctiva/sclera: Conjunctivae normal.     Pupils: Pupils are equal, round, and reactive to light.  Cardiovascular:     Rate and Rhythm: Normal rate and regular rhythm.     Pulses: Normal pulses.     Heart sounds: Normal heart sounds. No murmur heard. Pulmonary:     Effort: Pulmonary effort is normal. No respiratory distress.     Breath sounds: Normal breath sounds.  Abdominal:     Palpations: Abdomen is soft.     Tenderness: There is no abdominal tenderness.  Musculoskeletal:        General: No swelling. Normal range of motion.     Cervical back: Normal range of motion and neck supple.  Skin:    General: Skin is warm and dry.     Capillary Refill: Capillary refill takes less than 2 seconds.  Neurological:     General: No focal deficit present.     Mental Status: She is alert and oriented to person, place, and time.     Cranial Nerves: No cranial nerve deficit.     Sensory: No sensory deficit.     Motor: No weakness.     Coordination: Coordination normal.  Psychiatric:        Mood and Affect: Mood normal.     ED Results / Procedures / Treatments   Labs (all labs ordered are listed, but only abnormal results are displayed) Labs Reviewed  URINALYSIS, ROUTINE W REFLEX MICROSCOPIC - Abnormal; Notable for the following components:      Result Value   APPearance CLOUDY (*)    Glucose, UA >=500 (*)    Hgb urine dipstick MODERATE (*)    Bilirubin Urine SMALL (*)    Ketones, ur 40 (*)    Protein, ur 100 (*)    Leukocytes,Ua MODERATE (*)    All other components within  normal limits  CBC WITH DIFFERENTIAL/PLATELET - Abnormal; Notable for the following components:   Hemoglobin 11.2 (*)    HCT 34.6 (*)    All other components within normal limits  COMPREHENSIVE METABOLIC PANEL - Abnormal; Notable for the following components:   Sodium 129 (*)    Chloride 95 (*)    CO2 20 (*)    Glucose, Bld 284 (*)  BUN 31 (*)    Creatinine, Ser 1.50 (*)    Calcium 8.6 (*)    Albumin 3.1 (*)    Total Bilirubin 1.4 (*)    GFR, Estimated 34 (*)    All other components within normal limits  URINALYSIS, MICROSCOPIC (REFLEX) - Abnormal; Notable for the following components:   Bacteria, UA MANY (*)    All other components within normal limits  CBG MONITORING, ED - Abnormal; Notable for the following components:   Glucose-Capillary 240 (*)    All other components within normal limits  URINE CULTURE  LIPASE, BLOOD    EKG EKG Interpretation Date/Time:  Friday September 16 2023 16:58:29 EST Ventricular Rate:  81 PR Interval:  184 QRS Duration:  99 QT Interval:  426 QTC Calculation: 495 R Axis:   -22  Text Interpretation: Sinus rhythm Consider left atrial enlargement Confirmed by Virgina Norfolk [phone]) on 09/16/2023 5:14:05 PM  Radiology CT Renal Stone Study  Result Date: 09/16/2023 CLINICAL DATA:  Bladder biopsy with weakness and vomiting EXAM: CT ABDOMEN AND PELVIS WITHOUT CONTRAST TECHNIQUE: Multidetector CT imaging of the abdomen and pelvis was performed following the standard protocol without IV contrast. RADIATION DOSE REDUCTION: This exam was performed according to the departmental dose-optimization program which includes automated exposure control, adjustment of the mA and/or kV according to patient size and/or use of iterative reconstruction technique. COMPARISON:  CT 07/13/2023 FINDINGS: Lower chest: Lung bases demonstrate no acute airspace disease. Mild cardiomegaly. Hepatobiliary: Cholecystectomy. Prominent common bile duct presumably due to postsurgical  change. Pancreas: Atrophic.  No inflammation Spleen: Normal in size without focal abnormality. Adrenals/Urinary Tract: Adrenal glands are normal. Kidneys show no hydronephrosis. Diffuse bladder wall thickening. Moderate air in the urinary bladder Stomach/Bowel: Stomach is within normal limits. No evidence of bowel wall thickening, distention, or inflammatory changes. Vascular/Lymphatic: Moderate aortic atherosclerosis. No aneurysm. No suspicious lymph nodes Reproductive: Status post hysterectomy. No adnexal masses. Other: Negative for free air or free fluid. Postoperative changes along the anterior pelvic wall. Musculoskeletal: No acute or suspicious osseous abnormality IMPRESSION: 1. Diffuse bladder wall thickening. Moderate air in the urinary bladder, correlate for any recent instrumentation. 2. Otherwise no CT evidence for acute intra-abdominal or pelvic abnormality. 3. Aortic atherosclerosis. Aortic Atherosclerosis (ICD10-I70.0). Electronically Signed   By: Jasmine Pang M.D.   On: 09/16/2023 19:43   DG Chest Portable 1 View  Result Date: 09/16/2023 CLINICAL DATA:  Weakness, confusion EXAM: PORTABLE CHEST 1 VIEW COMPARISON:  07/31/2023 FINDINGS: Single frontal view of the chest demonstrates a stable cardiac silhouette. No airspace disease, effusion, or pneumothorax. Chronic scarring unchanged. No acute bony abnormalities. IMPRESSION: 1. Stable chest, no acute process. Electronically Signed   By: Sharlet Salina M.D.   On: 09/16/2023 17:53    Procedures Procedures    Medications Ordered in ED Medications  cephALEXin (KEFLEX) capsule 500 mg (has no administration in time range)  sodium chloride 0.9 % bolus 1,000 mL (0 mLs Intravenous Stopped 09/16/23 1914)    ED Course/ Medical Decision Making/ A&P                                 Medical Decision Making Amount and/or Complexity of Data Reviewed Labs: ordered. Radiology: ordered.  Risk Prescription drug management.   Jaton Seibert is here  with generalized weakness.  History of A-fib on amiodarone and Eliquis, history of diabetes.  Overall reassuring vitals.  Reassuring exam.  She has had ongoing  weakness now for what seems to be several months.  She had bladder cancer earlier this year and then went into remission after some therapy and now there is a concern for maybe reoccurrence.  She is supposed to follow-up with urology next week to go over her most recent bladder biopsy.  She had 1 episode of vomiting after the biopsy on Monday but has generally done well since.  She has no active abdominal pain she does not have any troubles having bowel movements or using the bathroom or urinating.  Overall we will check basic labs and urinalysis and likely to consider CT scan abdomen and pelvis.  She appears well.  I have very low suspicion for any postop complications as she is not having any major abdominal pain currently and no pain with urination or bowel movements.  No pain with urination.  Overall we will check CBC CMP urinalysis.  EKG shows sinus rhythm.  No ischemic changes.  She not having any chest pain and no concern for ACS or PE.  Sugars have been up and down and will make sure she is not in DKA as well.  Will give fluid bolus as she does look slightly dehydrated on exam.  Lab work per my review interpretation shows kidney function at 1.5.  It has been around 1.3 recently.  Lab work otherwise unremarkable.  CT scan with no acute findings otherwise.  Expected postop changes.  At this time I do not have any concern for postop complication or biopsy complication.  I do not think she has a bladder or ureter injury.  She is not having any abdominal pain currently.  She is urinating well.  There is no ascites on CT scan.  Overall she is feeling better after IV fluids.  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  She is made aware of this and I do think some of her generalized weakness could be from oncology process  ongoing.  We had a conversation about this and she will follow-up with urology has in place next week.  Will let them continue to guide her treatment.  Will have her follow-up with her primary care doctor to recheck kidney function and electrolytes to make sure those are stable.  She understand strict return precautions if she develops severe abdominal pain, nausea vomiting difficulty urinating as we may need to reevaluate her for postop/procedural injury but at this time I do not have suspicion for that.  She was discharged in good condition.  She understands return precautions.  She has adequate follow-up in place already with urology next week.  This chart was dictated using voice recognition software.  Despite best efforts to proofread,  errors can occur which can change the documentation meaning.         Final Clinical Impression(s) / ED Diagnoses Final diagnoses:  Weakness  Acute cystitis without hematuria    Rx / DC Orders ED Discharge Orders          Ordered    cephALEXin (KEFLEX) 500 MG capsule  2 times daily        09/16/23 2021              Virgina Norfolk, DO 09/16/23 2022

## 2023-09-16 NOTE — Discharge Instructions (Signed)
Take Keflex 500 twice daily for 5 days.  Follow-up with your primary care doctor for them to continue to monitor your kidney function and electrolytes as we discussed and follow-up with Dr. Alvester Morin with urology to further discuss your pathology findings as we discussed.  Please return if symptoms worsen including fever, severe abdominal pain nausea or vomiting.

## 2023-09-16 NOTE — ED Triage Notes (Signed)
The patient is having continued weakness. She had a bladder biopsy on Monday and had an episode of vomiting.

## 2023-09-19 DIAGNOSIS — R5381 Other malaise: Secondary | ICD-10-CM | POA: Diagnosis not present

## 2023-09-19 DIAGNOSIS — K862 Cyst of pancreas: Secondary | ICD-10-CM | POA: Diagnosis not present

## 2023-09-19 DIAGNOSIS — R634 Abnormal weight loss: Secondary | ICD-10-CM | POA: Diagnosis not present

## 2023-09-19 DIAGNOSIS — C679 Malignant neoplasm of bladder, unspecified: Secondary | ICD-10-CM | POA: Diagnosis not present

## 2023-09-19 LAB — URINE CULTURE: Culture: >=100000 — AB

## 2023-09-20 ENCOUNTER — Telehealth (HOSPITAL_BASED_OUTPATIENT_CLINIC_OR_DEPARTMENT_OTHER): Payer: Self-pay | Admitting: *Deleted

## 2023-09-20 NOTE — Telephone Encounter (Signed)
Post ED Visit - Positive Culture Follow-up: Successful Patient Follow-Up  Culture assessed and recommendations reviewed by:  [x]  Daylene Posey, Pharm.D. []  Celedonio Miyamoto, Pharm.D., BCPS AQ-ID []  Garvin Fila, Pharm.D., BCPS []  Georgina Pillion, Pharm.D., BCPS []  [place], [address].D., BCPS, AAHIVP []  Estella Husk, Pharm.D., BCPS, AAHIVP []  Lysle Pearl, PharmD, BCPS []  Phillips Climes, PharmD, BCPS []  Agapito Games, PharmD, BCPS []  Verlan Friends, PharmD  Positive urine culture  []  Patient discharged without antimicrobial prescription and treatment is now indicated [x]  Organism is resistant to prescribed ED discharge antimicrobial []  Patient with positive blood cultures  Changes discussed with ED provider: Luther Hearing  PA-C New antibiotic prescription  Fosfomycin 3 g PO X1 Called to [facility] on [address] patient,'s daughter on date 0919, time 0919   Bing Quarry 09/20/2023, 9:08 AM

## 2023-09-20 NOTE — Progress Notes (Signed)
ED Antimicrobial Stewardship Positive Culture Follow Up   [name] is an 88 y.o. female who presented to [facility] on 09/16/2023 with a chief complaint of  Chief Complaint  Patient presents with   Weakness    Recent Results (from the past 720 hour(s))  Urine Culture     Status: Abnormal   Collection Time: 09/16/23  5:17 PM   Specimen: Urine, Clean Catch  Result Value Ref Range Status   Specimen Description   Final    URINE, CLEAN CATCH Performed at [facility], [address] [place], [place] [address]    Special Requests   Final    NONE Performed at [facility], [address] [place], [place] [address]    Culture (A)  Final    >=100,000 COLONIES/mL KLEBSIELLA PNEUMONIAE Confirmed Extended Spectrum Beta-Lactamase Producer (ESBL).  In bloodstream infections from ESBL organisms, carbapenems are preferred over piperacillin/tazobactam. They are shown to have a lower risk of mortality.    Report Status 09/19/2023 FINAL  Final   Organism ID, Bacteria KLEBSIELLA PNEUMONIAE (A)  Final      Susceptibility   Klebsiella pneumoniae - MIC*    AMPICILLIN >=32 RESISTANT Resistant     CEFAZOLIN >=64 RESISTANT Resistant     CEFEPIME >=32 RESISTANT Resistant     CEFTRIAXONE >=64 RESISTANT Resistant     CIPROFLOXACIN >=4 RESISTANT Resistant     GENTAMICIN >=16 RESISTANT Resistant     IMIPENEM <=0.25 SENSITIVE Sensitive     NITROFURANTOIN 256 RESISTANT Resistant     TRIMETH/SULFA >=320 RESISTANT Resistant     AMPICILLIN/SULBACTAM >=32 RESISTANT Resistant     PIP/TAZO >=128 RESISTANT Resistant ug/mL    * >=100,000 COLONIES/mL KLEBSIELLA PNEUMONIAE    [x]  Treated with keflex, organism resistant to prescribed antimicrobial []  Patient discharged originally without antimicrobial agent and treatment is now indicated  New antibiotic prescription: Fosfomycin  ED Provider: Luther Hearing, PA-C   Daylene Posey 09/20/2023, 7:31 AM Clinical  Pharmacist Monday - Friday phone -  [phone] Saturday - Sunday phone - [phone]

## 2023-09-21 ENCOUNTER — Telehealth: Payer: Self-pay | Admitting: Physician Assistant

## 2023-09-21 DIAGNOSIS — C678 Malignant neoplasm of overlapping sites of bladder: Secondary | ICD-10-CM | POA: Diagnosis not present

## 2023-09-21 DIAGNOSIS — E118 Type 2 diabetes mellitus with unspecified complications: Secondary | ICD-10-CM | POA: Diagnosis not present

## 2023-09-21 DIAGNOSIS — R8271 Bacteriuria: Secondary | ICD-10-CM | POA: Diagnosis not present

## 2023-09-21 DIAGNOSIS — I10 Essential (primary) hypertension: Secondary | ICD-10-CM | POA: Diagnosis not present

## 2023-09-21 DIAGNOSIS — D090 Carcinoma in situ of bladder: Secondary | ICD-10-CM | POA: Diagnosis not present

## 2023-09-21 DIAGNOSIS — N1831 Chronic kidney disease, stage 3a: Secondary | ICD-10-CM | POA: Diagnosis not present

## 2023-09-21 DIAGNOSIS — E785 Hyperlipidemia, unspecified: Secondary | ICD-10-CM | POA: Diagnosis not present

## 2023-09-21 NOTE — Telephone Encounter (Signed)
Scheduled appointment per staff message. Talked with the patients daughter who is aware of the made appointments.

## 2023-09-24 ENCOUNTER — Inpatient Hospital Stay (HOSPITAL_COMMUNITY)
Admission: EM | Admit: 2023-09-24 | Discharge: 2023-09-27 | DRG: 690 | Disposition: A | Discharge status: Home Health | Payer: Medicare PPO | Attending: Family Medicine | Admitting: Family Medicine

## 2023-09-24 ENCOUNTER — Other Ambulatory Visit: Payer: Self-pay

## 2023-09-24 ENCOUNTER — Encounter (HOSPITAL_COMMUNITY): Payer: Self-pay

## 2023-09-24 DIAGNOSIS — Z90710 Acquired absence of both cervix and uterus: Secondary | ICD-10-CM

## 2023-09-24 DIAGNOSIS — C679 Malignant neoplasm of bladder, unspecified: Secondary | ICD-10-CM | POA: Diagnosis present

## 2023-09-24 DIAGNOSIS — Z1612 Extended spectrum beta lactamase (ESBL) resistance: Secondary | ICD-10-CM | POA: Diagnosis present

## 2023-09-24 DIAGNOSIS — Z79890 Hormone replacement therapy (postmenopausal): Secondary | ICD-10-CM | POA: Diagnosis not present

## 2023-09-24 DIAGNOSIS — E16A1 Hypoglycemia level 1: Secondary | ICD-10-CM | POA: Diagnosis not present

## 2023-09-24 DIAGNOSIS — Z833 Family history of diabetes mellitus: Secondary | ICD-10-CM

## 2023-09-24 DIAGNOSIS — N390 Urinary tract infection, site not specified: Principal | ICD-10-CM | POA: Diagnosis present

## 2023-09-24 DIAGNOSIS — Z1624 Resistance to multiple antibiotics: Secondary | ICD-10-CM | POA: Diagnosis present

## 2023-09-24 DIAGNOSIS — Z87891 Personal history of nicotine dependence: Secondary | ICD-10-CM

## 2023-09-24 DIAGNOSIS — E039 Hypothyroidism, unspecified: Secondary | ICD-10-CM | POA: Diagnosis present

## 2023-09-24 DIAGNOSIS — E1065 Type 1 diabetes mellitus with hyperglycemia: Secondary | ICD-10-CM | POA: Diagnosis present

## 2023-09-24 DIAGNOSIS — Z823 Family history of stroke: Secondary | ICD-10-CM

## 2023-09-24 DIAGNOSIS — I480 Paroxysmal atrial fibrillation: Secondary | ICD-10-CM | POA: Diagnosis present

## 2023-09-24 DIAGNOSIS — R81 Glycosuria: Secondary | ICD-10-CM | POA: Diagnosis present

## 2023-09-24 DIAGNOSIS — B9620 Unspecified Escherichia coli [E. coli] as the cause of diseases classified elsewhere: Secondary | ICD-10-CM | POA: Diagnosis present

## 2023-09-24 DIAGNOSIS — Z860100 Personal history of colon polyps, unspecified: Secondary | ICD-10-CM

## 2023-09-24 DIAGNOSIS — E875 Hyperkalemia: Secondary | ICD-10-CM | POA: Diagnosis present

## 2023-09-24 DIAGNOSIS — M7989 Other specified soft tissue disorders: Secondary | ICD-10-CM | POA: Diagnosis present

## 2023-09-24 DIAGNOSIS — R9431 Abnormal electrocardiogram [ECG] [EKG]: Secondary | ICD-10-CM | POA: Diagnosis not present

## 2023-09-24 DIAGNOSIS — R319 Hematuria, unspecified: Secondary | ICD-10-CM | POA: Diagnosis present

## 2023-09-24 DIAGNOSIS — Z794 Long term (current) use of insulin: Secondary | ICD-10-CM

## 2023-09-24 DIAGNOSIS — E10649 Type 1 diabetes mellitus with hypoglycemia without coma: Secondary | ICD-10-CM | POA: Diagnosis not present

## 2023-09-24 DIAGNOSIS — Z7901 Long term (current) use of anticoagulants: Secondary | ICD-10-CM | POA: Diagnosis not present

## 2023-09-24 DIAGNOSIS — B9689 Other specified bacterial agents as the cause of diseases classified elsewhere: Secondary | ICD-10-CM | POA: Diagnosis not present

## 2023-09-24 DIAGNOSIS — I10 Essential (primary) hypertension: Secondary | ICD-10-CM | POA: Diagnosis present

## 2023-09-24 DIAGNOSIS — E871 Hypo-osmolality and hyponatremia: Secondary | ICD-10-CM | POA: Diagnosis present

## 2023-09-24 DIAGNOSIS — Z79899 Other long term (current) drug therapy: Secondary | ICD-10-CM | POA: Diagnosis not present

## 2023-09-24 DIAGNOSIS — E785 Hyperlipidemia, unspecified: Secondary | ICD-10-CM | POA: Diagnosis present

## 2023-09-24 DIAGNOSIS — B961 Klebsiella pneumoniae [K. pneumoniae] as the cause of diseases classified elsewhere: Secondary | ICD-10-CM | POA: Diagnosis present

## 2023-09-24 DIAGNOSIS — Z8249 Family history of ischemic heart disease and other diseases of the circulatory system: Secondary | ICD-10-CM

## 2023-09-24 DIAGNOSIS — Z83438 Family history of other disorder of lipoprotein metabolism and other lipidemia: Secondary | ICD-10-CM

## 2023-09-24 DIAGNOSIS — B9629 Other Escherichia coli [E. coli] as the cause of diseases classified elsewhere: Principal | ICD-10-CM

## 2023-09-24 LAB — BASIC METABOLIC PANEL
Anion gap: 8 (ref 5–15)
BUN: 23 mg/dL (ref 8–23)
CO2: 24 mmol/L (ref 22–32)
Calcium: 8.4 mg/dL — ABNORMAL LOW (ref 8.9–10.3)
Chloride: 96 mmol/L — ABNORMAL LOW (ref 98–111)
Creatinine, Ser: 1.11 mg/dL — ABNORMAL HIGH (ref 0.44–1.00)
GFR, Estimated: 48 mL/min — ABNORMAL LOW (ref >60)
Glucose, Bld: 406 mg/dL — ABNORMAL HIGH (ref 70–99)
Potassium: 5.6 mmol/L — ABNORMAL HIGH (ref 3.5–5.1)
Sodium: 128 mmol/L — ABNORMAL LOW (ref 135–145)

## 2023-09-24 LAB — URINALYSIS, ROUTINE W REFLEX MICROSCOPIC
Bacteria, UA: NONE SEEN
Bilirubin Urine: NEGATIVE
Glucose, UA: >=500 mg/dL — AB
Hgb urine dipstick: NEGATIVE
Ketones, ur: NEGATIVE mg/dL
Leukocytes,Ua: NEGATIVE
Nitrite: NEGATIVE
Protein, ur: NEGATIVE mg/dL
Specific Gravity, Urine: 1.016 (ref 1.005–1.030)
pH: 7 (ref 5.0–8.0)

## 2023-09-24 LAB — GLUCOSE, CAPILLARY
Glucose-Capillary: 335 mg/dL — ABNORMAL HIGH (ref 70–99)
Glucose-Capillary: 218 mg/dL — ABNORMAL HIGH (ref 70–99)

## 2023-09-24 LAB — CBC WITH DIFFERENTIAL/PLATELET
Abs Immature Granulocytes: 0.02 10*3/uL (ref 0.00–0.07)
Basophils Absolute: 0.1 10*3/uL (ref 0.0–0.1)
Basophils Relative: 1 %
Eosinophils Absolute: 0.1 10*3/uL (ref 0.0–0.5)
Eosinophils Relative: 1 %
HCT: 35.1 % — ABNORMAL LOW (ref 36.0–46.0)
Hemoglobin: 11.2 g/dL — ABNORMAL LOW (ref 12.0–15.0)
Immature Granulocytes: 0 %
Lymphocytes Relative: 14 %
Lymphs Abs: 0.9 10*3/uL (ref 0.7–4.0)
MCH: 28.6 pg (ref 26.0–34.0)
MCHC: 31.9 g/dL (ref 30.0–36.0)
MCV: 89.5 fL (ref 80.0–100.0)
Monocytes Absolute: 0.5 10*3/uL (ref 0.1–1.0)
Monocytes Relative: 7 %
Neutro Abs: 4.8 10*3/uL (ref 1.7–7.7)
Neutrophils Relative %: 77 %
Platelets: 200 10*3/uL (ref 150–400)
RBC: 3.92 MIL/uL (ref 3.87–5.11)
RDW: 14.5 % (ref 11.5–15.5)
WBC: 6.3 10*3/uL (ref 4.0–10.5)
nRBC: 0 % (ref 0.0–0.2)

## 2023-09-24 LAB — PHOSPHORUS: Phosphorus: 2.6 mg/dL (ref 2.5–4.6)

## 2023-09-24 LAB — TSH: TSH: 11.85 u[IU]/mL — ABNORMAL HIGH (ref 0.350–4.500)

## 2023-09-24 LAB — MAGNESIUM: Magnesium: 2.4 mg/dL (ref 1.7–2.4)

## 2023-09-24 LAB — CBG MONITORING, ED: Glucose-Capillary: 376 mg/dL — ABNORMAL HIGH (ref 70–99)

## 2023-09-24 MED ORDER — SENNOSIDES-DOCUSATE SODIUM 8.6-50 MG PO TABS
1.0000 | ORAL_TABLET | Freq: Every evening | ORAL | Status: DC | PRN
  Administered 2023-09-24 – 2023-09-25 (×2): 1 via ORAL
  Filled 2023-09-24 (×2): qty 1

## 2023-09-24 MED ORDER — INSULIN GLARGINE-YFGN 100 UNIT/ML ~~LOC~~ SOLN: 12.0000 [IU] | Freq: Two times a day (BID) | SUBCUTANEOUS | Status: DC

## 2023-09-24 MED ORDER — ONDANSETRON HCL 4 MG PO TABS: 4.0000 mg | ORAL_TABLET | Freq: Four times a day (QID) | ORAL | Status: DC | PRN

## 2023-09-24 MED ORDER — APIXABAN 2.5 MG PO TABS
2.5000 mg | ORAL_TABLET | Freq: Two times a day (BID) | ORAL | Status: DC
  Administered 2023-09-24 – 2023-09-27 (×6): 2.5 mg via ORAL
  Filled 2023-09-24 (×6): qty 1

## 2023-09-24 MED ORDER — ACETAMINOPHEN 650 MG RE SUPP: 650.0000 mg | Freq: Four times a day (QID) | RECTAL | Status: DC | PRN

## 2023-09-24 MED ORDER — AMIODARONE HCL 200 MG PO TABS: 200.0000 mg | ORAL_TABLET | Freq: Every day | ORAL | Status: DC

## 2023-09-24 MED ORDER — INSULIN ASPART 100 UNIT/ML IJ SOLN
0.0000 [IU] | Freq: Every day | INTRAMUSCULAR | Status: DC
  Administered 2023-09-24 – 2023-09-26 (×2): 2 [IU] via SUBCUTANEOUS
  Filled 2023-09-24: qty 0.05

## 2023-09-24 MED ORDER — MIRTAZAPINE 30 MG PO TABS: 30.0000 mg | ORAL_TABLET | Freq: Every evening | ORAL | Status: DC | PRN

## 2023-09-24 MED ORDER — INSULIN GLARGINE-YFGN 100 UNIT/ML ~~LOC~~ SOLN
12.0000 [IU] | Freq: Every day | SUBCUTANEOUS | Status: DC
  Administered 2023-09-24 – 2023-09-26 (×3): 12 [IU] via SUBCUTANEOUS
  Filled 2023-09-24 (×4): qty 0.12

## 2023-09-24 MED ORDER — INSULIN ASPART 100 UNIT/ML IJ SOLN
0.0000 [IU] | Freq: Three times a day (TID) | INTRAMUSCULAR | Status: DC
  Administered 2023-09-25: 2 [IU] via SUBCUTANEOUS
  Administered 2023-09-25: 3 [IU] via SUBCUTANEOUS
  Administered 2023-09-25: 5 [IU] via SUBCUTANEOUS
  Administered 2023-09-26: 8 [IU] via SUBCUTANEOUS
  Administered 2023-09-26: 3 [IU] via SUBCUTANEOUS
  Administered 2023-09-26 – 2023-09-27 (×2): 8 [IU] via SUBCUTANEOUS
  Administered 2023-09-27: 5 [IU] via SUBCUTANEOUS
  Filled 2023-09-24: qty 0.15

## 2023-09-24 MED ORDER — INSULIN ASPART 100 UNIT/ML IJ SOLN
6.0000 [IU] | Freq: Once | INTRAMUSCULAR | Status: AC
  Administered 2023-09-24: 6 [IU] via SUBCUTANEOUS
  Filled 2023-09-24: qty 0.06

## 2023-09-24 MED ORDER — LEVOTHYROXINE SODIUM 100 MCG PO TABS
100.0000 ug | ORAL_TABLET | Freq: Every day | ORAL | Status: DC
  Administered 2023-09-25 – 2023-09-27 (×3): 100 ug via ORAL
  Filled 2023-09-24 (×3): qty 1

## 2023-09-24 MED ORDER — ONDANSETRON HCL 4 MG/2ML IJ SOLN: 4.0000 mg | Freq: Four times a day (QID) | INTRAMUSCULAR | Status: DC | PRN

## 2023-09-24 MED ORDER — MEROPENEM 500 MG IV SOLR
500.0000 mg | Freq: Two times a day (BID) | INTRAVENOUS | Status: DC
  Administered 2023-09-24 – 2023-09-25 (×2): 500 mg via INTRAVENOUS
  Filled 2023-09-24 (×3): qty 10

## 2023-09-24 MED ORDER — AMIODARONE HCL 200 MG PO TABS
100.0000 mg | ORAL_TABLET | Freq: Every day | ORAL | Status: DC
  Administered 2023-09-24 – 2023-09-26 (×3): 100 mg via ORAL
  Filled 2023-09-24 (×3): qty 1

## 2023-09-24 MED ORDER — DILTIAZEM HCL ER COATED BEADS 180 MG PO CP24
180.0000 mg | ORAL_CAPSULE | Freq: Every day | ORAL | Status: DC
  Administered 2023-09-24 – 2023-09-27 (×4): 180 mg via ORAL
  Filled 2023-09-24 (×4): qty 1

## 2023-09-24 MED ORDER — ACETAMINOPHEN 325 MG PO TABS: 650.0000 mg | ORAL_TABLET | Freq: Four times a day (QID) | ORAL | Status: DC | PRN

## 2023-09-24 NOTE — Progress Notes (Addendum)
Pharmacy Antibiotic Note  [name] is a 88 y.o. female admitted on 09/24/2023 with an ESBL UTI. Ucx in the chart (found in the media tab) were taken 12/6 at an outpatient facility and show ESBL Klebsiella pneumoniae--susceptible to Avycaz, imipenem, meropenem, Vabomere. Pharmacy has been consulted for meropenem dosing.  Plan: - Start meropenem 500mg  IV q12hrs  - Monitor renal function, cultures, and overall clinical picture  Height: 5\' 2"  (157.5 cm) Weight: 51 kg (112 lb 7 oz) IBW/kg (Calculated) : 50.1  Temp (24hrs), Avg:98 F (36.7 C), Min:98 F (36.7 C), Max:98 F (36.7 C)  No results for input(s): "WBC", "CREATININE", "LATICACIDVEN", "VANCOTROUGH", "VANCOPEAK", "VANCORANDOM", "GENTTROUGH", "GENTPEAK", "GENTRANDOM", "TOBRATROUGH", "TOBRAPEAK", "TOBRARND", "AMIKACINPEAK", "AMIKACINTROU", "AMIKACIN" in the last 168 hours.  Estimated Creatinine Clearance: 21.3 mL/min (A) (by C-G formula based on SCr of 1.5 mg/dL (H)).    No Known Allergies  Antimicrobials this admission: 12/14 meropenem >>   Dose adjustments this admission: N/A  Microbiology results: 12/14 BCx: not yet collected 12/14 Ucx: not yet collected 12/6 UCx: ESBL K pneumo    Thank you for allowing pharmacy to be a part of this patient's care.  Cherylin Mylar, PharmD Clinical Pharmacist  12/14/20241:32 PM

## 2023-09-24 NOTE — Plan of Care (Signed)

## 2023-09-24 NOTE — ED Notes (Signed)
ED TO INPATIENT HANDOFF REPORT  Name/Age/Gender [name] 88 y.o. female  Code Status    Code Status Orders  (From admission, onward)           Start     Ordered   09/24/23 1637  Full code  Continuous       Question:  By:  Answer:  Consent: discussion documented in EHR   09/24/23 1636           Code Status History     Date Active Date Inactive Code Status Order ID Comments User Context   07/31/2023 1200 08/04/2023 1951 Full Code [id number]  Bobette Mo, MD ED   05/02/2022 1945 05/04/2022 1905 Full Code [id number]  Teddy Spike, DO Inpatient   02/05/2022 2022 02/08/2022 1835 Full Code [id number]  Jacques Navy, MD ED   01/25/2022 1615 01/27/2022 2100 Full Code [id number]  Orland Mustard, MD ED   09/01/2021 0110 09/04/2021 2111 Full Code [id number]  Chotiner, Claudean Severance, MD Inpatient   03/13/2021 2044 03/16/2021 1756 Full Code [id number]  Rometta Emery, MD ED   03/23/2020 0024 03/23/2020 2227 Full Code [id number]  Liborio Nixon, MD ED   06/07/2017 2211 06/09/2017 1456 Full Code [id number]  Eduard Clos, MD ED       Home/SNF/Other Home  Chief Complaint Urinary tract infection due to ESBL Klebsiella [N39.0, B96.89]  Level of Care/Admitting Diagnosis ED Disposition     ED Disposition  Admit   Condition  --   Comment  Hospital Area: [facility] [[id number]]  Level of Care: Med-Surg [16]  May place patient in observation at [facility] or [facility] if equivalent level of care is available:: No  Covid Evaluation: Asymptomatic - no recent exposure (last 10 days) testing not required  Diagnosis: Urinary tract infection due to ESBL Klebsiella [723104]  Admitting Physician: Steffanie Rainwater [[id number]]  Attending Physician: Steffanie Rainwater [[id number]]          Medical History Past Medical History:  Diagnosis Date   Anemia    Arthritis    Colon polyp    Diabetes mellitus    type 1   Heart murmur    Hyperlipidemia     Hypertension    Hypothyroidism    Pneumonia    Poor appetite    Thyroid disease     Allergies No Known Allergies  IV Location/Drains/Wounds Patient Lines/Drains/Airways Status     Active Line/Drains/Airways     Name Placement date Placement time Site Days   Peripheral IV 09/24/23 22 G 1" Right Antecubital 09/24/23  1448  Antecubital  less than 1   Pressure Injury 07/31/23 Coccyx Medial;Mid Stage 2 -  Partial thickness loss of dermis presenting as a shallow open injury with a red, pink wound bed without slough. 07/31/23  1418  -- 55            Labs/Imaging Results for orders placed or performed during the hospital encounter of 09/24/23 (from the past 48 hours)  CBC with Differential     Status: Abnormal   Collection Time: 09/24/23  2:35 PM  Result Value Ref Range   WBC 6.3 4.0 - 10.5 K/uL   RBC 3.92 3.87 - 5.11 MIL/uL   Hemoglobin 11.2 (L) 12.0 - 15.0 g/dL   HCT 09.3 (L) 81.8 - 29.9 %   MCV 89.5 80.0 - 100.0 fL   MCH 28.6 26.0 - 34.0 pg   MCHC 31.9 30.0 - 36.0 g/dL  RDW 14.5 11.5 - 15.5 %   Platelets 200 150 - 400 K/uL   nRBC 0.0 0.0 - 0.2 %   Neutrophils Relative % 77 %   Neutro Abs 4.8 1.7 - 7.7 K/uL   Lymphocytes Relative 14 %   Lymphs Abs 0.9 0.7 - 4.0 K/uL   Monocytes Relative 7 %   Monocytes Absolute 0.5 0.1 - 1.0 K/uL   Eosinophils Relative 1 %   Eosinophils Absolute 0.1 0.0 - 0.5 K/uL   Basophils Relative 1 %   Basophils Absolute 0.1 0.0 - 0.1 K/uL   Immature Granulocytes 0 %   Abs Immature Granulocytes 0.02 0.00 - 0.07 K/uL    Comment: Performed at [facility], [address], [place], [place] [address]  Basic metabolic panel     Status: Abnormal   Collection Time: 09/24/23  2:35 PM  Result Value Ref Range   Sodium 128 (L) 135 - 145 mmol/L   Potassium 5.6 (H) 3.5 - 5.1 mmol/L   Chloride 96 (L) 98 - 111 mmol/L   CO2 24 22 - 32 mmol/L   Glucose, Bld 406 (H) 70 - 99 mg/dL    Comment: Glucose reference range applies only to samples  taken after fasting for at least 8 hours.   BUN 23 8 - 23 mg/dL   Creatinine, Ser 1.91 (H) 0.44 - 1.00 mg/dL   Calcium 8.4 (L) 8.9 - 10.3 mg/dL   GFR, Estimated 48 (L) >60 mL/min    Comment: (NOTE) Calculated using the CKD-EPI Creatinine Equation (2021)    Anion gap 8 5 - 15    Comment: Performed at [facility], [address] [place], [place] [address]  Urinalysis, Routine w reflex microscopic -Urine, Clean Catch     Status: Abnormal   Collection Time: 09/24/23  2:36 PM  Result Value Ref Range   Color, Urine STRAW (A) YELLOW   APPearance CLEAR CLEAR   Specific Gravity, Urine 1.016 1.005 - 1.030   pH 7.0 5.0 - 8.0   Glucose, UA >=500 (A) NEGATIVE mg/dL   Hgb urine dipstick NEGATIVE NEGATIVE   Bilirubin Urine NEGATIVE NEGATIVE   Ketones, ur NEGATIVE NEGATIVE mg/dL   Protein, ur NEGATIVE NEGATIVE mg/dL   Nitrite NEGATIVE NEGATIVE   Leukocytes,Ua NEGATIVE NEGATIVE   RBC / HPF 0-5 0 - 5 RBC/hpf   WBC, UA 0-5 0 - 5 WBC/hpf   Bacteria, UA NONE SEEN NONE SEEN   Squamous Epithelial / HPF 0-5 0 - 5 /HPF    Comment: Performed at [facility], [address] [place], [place] [address]   No results found.  Pending Labs Unresulted Labs (From admission, onward)     Start     Ordered   09/24/23 1304  Urine Culture  Once,   URGENT       Question:  Indication  Answer:  Dysuria   09/24/23 1303   09/24/23 1304  Blood culture (routine x 2)  BLOOD CULTURE X 2,   R (with STAT occurrences)      09/24/23 1303            Vitals/Pain Today's Vitals   09/24/23 1230 09/24/23 1233 09/24/23 1553 09/24/23 1638  BP: 135/61  (!) 150/74   Pulse: 80  96   Resp: 20  19   Temp: 98 F (36.7 C)   97.8 F (36.6 C)  TempSrc:   Oral Oral  SpO2: 98%  100%   Weight:  51 kg  Height:  5\' 2"  (1.575 m)    PainSc:  0-No pain      Isolation Precautions No active isolations  Medications Medications  meropenem (MERREM) 500 mg in sodium chloride 0.9 % 100 mL  IVPB (500 mg Intravenous New Bag/Given 09/24/23 1458)  acetaminophen (TYLENOL) tablet 650 mg (has no administration in time range)    Or  acetaminophen (TYLENOL) suppository 650 mg (has no administration in time range)  senna-docusate (Senokot-S) tablet 1 tablet (has no administration in time range)  ondansetron (ZOFRAN) tablet 4 mg (has no administration in time range)    Or  ondansetron (ZOFRAN) injection 4 mg (has no administration in time range)  insulin aspart (novoLOG) injection 0-15 Units (has no administration in time range)  insulin aspart (novoLOG) injection 0-5 Units (has no administration in time range)    Mobility walks with person assist

## 2023-09-24 NOTE — ED Triage Notes (Addendum)
Pt reports bladder biopsy on 09/12/2023 and was seen on 09/16/23 and placed on Keflex.  Since patient has started having hematuria.  Pt currently on eliquis.  Denies abd pain, fever, body aches, chills, urinary frequency.

## 2023-09-24 NOTE — ED Notes (Signed)
Pt stated that when she was placed in exam room after being brought from triage, she was not given the call button. Patient Access informed me that pt was upset because she urinated on herself. I went in to check on pt & see what was going on and pt explained the situation. I offered pt a brief to change in and she refused. I also offered for her to speak with the charge nurse because the assigned nurse was on lunch. I continued to apologize and proceeded to get the charge nurse.

## 2023-09-24 NOTE — ED Provider Notes (Signed)
Care signed out to me pending labs, but will need admission for ESBL UTI requiring meropenem  Patient without complaints other than some generalized weakness.  Does have some hematuria minor dysuria Physical Exam  BP (!) 150/74 (BP Location: Left Arm)   Pulse 96   Temp 98 F (36.7 C)   Resp 19   Ht 5\' 2"  (1.575 m)   Wt 51 kg   SpO2 100%   BMI 20.56 kg/m   Physical Exam HENT:     Head: Normocephalic and atraumatic.     Nose: Nose normal.     Mouth/Throat:     Mouth: Mucous membranes are moist.  Eyes:     Conjunctiva/sclera: Conjunctivae normal.  Cardiovascular:     Rate and Rhythm: Normal rate.  Pulmonary:     Effort: Pulmonary effort is normal.  Musculoskeletal:     Right lower leg: No edema.     Left lower leg: No edema.  Skin:    General: Skin is warm.     Capillary Refill: Capillary refill takes less than 2 seconds.  Neurological:     Mental Status: She is oriented to person, place, and time.  Psychiatric:        Mood and Affect: Mood normal.        Behavior: Behavior normal.     Procedures  Procedures  ED Course / MDM   Clinical Course as of 09/24/23 1602  Sat Sep 24, 2023  1544 Basic metabolic panel(!) Appears to have had hyponatremia for at least the past month.  Also appears that she has had some low potassium recently and was prescribed potassium pills.  After discussion with patient and she states that she has been drinking close to a gallon of water as she was told this would help with her UTI and with her hematuria.   [TY]    Clinical Course User Index [TY] Coral Spikes, DO   Medical Decision Making Well-appearing 88 year old here for meropenem as outpatient urine culture with ESBL UTI.  Afebrile vital signs reassuring.  Signed out to me by Dr. Rhae Hammock from morning.  See his note for full HPI.  Does appear to have hyponatremia and hyperkalemia.  EKG does not appear to have changes for her hyperkalemia.  After talking with patient she has been drinking  close to a gallon of water recently to help with her hematuria and urinary tract infection and has been taking potassium supplementation as she was told her potassium is low.  Urine without overt sign of infection. Will admit for antibiotics and for her hyponatremia and hyperkalemia.   Amount and/or Complexity of Data Reviewed Labs: ordered. Decision-making details documented in ED Course. ECG/medicine tests: ordered.  Risk Decision regarding hospitalization.        Coral Spikes, DO 09/24/23 [phone]

## 2023-09-24 NOTE — Progress Notes (Addendum)
Pt arrived from Er   no acute distress noted at this time  pt pleasant and agreeable with care,   meg and this nurse completed skin care check.  Med rec  completed with pharmacy  and md  daughter at bedside and is good historian of mothers care, she gives pts meds Awaiting all meds to be updated    bed locked and in low position safety measures in place  handoff report given to jasen Rn at bedside

## 2023-09-24 NOTE — H&P (Signed)
History and Physical    Patient: [name] WUJ:[id number] DOB: 25-Feb-1935 DOA: 09/24/2023 DOS: the patient was seen and examined on 09/24/2023 PCP: [name], MD  Patient coming from: Home  Chief Complaint:  Chief Complaint  Patient presents with   Hematuria   HPI: [name] is a 88 y.o. female with medical history significant of HTN, LADA (managed as T1DM), HLD, A-fib on Eliquis, hypothyroidism, and chronic cystitis on daily antibiotics who presents to the ED for evaluation of persistent urinary frequency/urgency and intermittent hematuria. States she was seen on 12/2 by urology for bladder biopsy.  She was having some intermittent hematuria as well as urinary frequency and urgency for UA.  She presented to the ED for evaluation of generalized weakness and UA and urine culture were collected. She was discharged home with Keflex however urine culture grew ESBL Klebsiella pneumoniae only sensitive to imipenem. She had another urine culture by her urologist yesterday that also grew ESBL Klebsiella pneumonia only susceptible to Avycaz, imipenem, meropenem and Vabomere.  She was advised to come to the ED for IV meropenem.  Patient reports generalized weakness over the last few weeks and intermittent hematuria and frequent urination but denies any fevers, chills, dysuria, palpitations, dizziness or shortness of breath.  ED course: Initial vitals showed temp 98, RR 20, HR 80, BP 135/61, SpO2 98% on room air. Labs show sodium 128 (Katz corrected Na of 133), K+ 5.6, bicarb 24, glucose 406, creatinine 1.11, WBC 6.3, Hgb 11.2, platelet 200, UA shows significant glucosuria, no hemoglobinuria, negative nitrate, negative leuk, WBC 0-5, no bacteria. EKG shows normal sinus rhythm with LAE Pharmacy was consulted for IV meropenem [facility] was consulted for admission  Review of Systems: As mentioned in the history of present illness. All other systems reviewed and are negative. Past Medical History:   Diagnosis Date   Anemia    Arthritis    Colon polyp    Diabetes mellitus    type 1   Heart murmur    Hyperlipidemia    Hypertension    Hypothyroidism    Pneumonia    Poor appetite    Thyroid disease    Past Surgical History:  Procedure Laterality Date   ABDOMINAL HYSTERECTOMY     APPENDECTOMY     BIOPSY  02/07/2022   Procedure: BIOPSY;  Surgeon: Meryl Dare, MD;  Location: [facility] ENDOSCOPY;  Service: Gastroenterology;;   BREAST EXCISIONAL BIOPSY Right    CARDIOVASCULAR STRESS TEST  07/15/2006   Normal scan, no ECG changes   COLONOSCOPY WITH PROPOFOL N/A 02/07/2022   Procedure: COLONOSCOPY WITH PROPOFOL;  Surgeon: Meryl Dare, MD;  Location: [facility] ENDOSCOPY;  Service: Gastroenterology;  Laterality: N/A;   CYSTOSCOPY WITH BIOPSY Bilateral 11/19/2022   Procedure: CYSTOSCOPY WITH TRANSURETHRAL RESECTION OF BLADDER TUMOR BILATERAL RETROGRADE PYELOGRAM;  Surgeon: Crista Elliot, MD;  Location: [facility] ORS;  Service: Urology;  Laterality: Bilateral;  1 HR FOR CASE   CYSTOSCOPY WITH BIOPSY Bilateral 09/12/2023   Procedure: CYSTOSCOPY WITH BIOPSY FULGURATION  BILATERAL RETROGRADE PYELOGRAM, BILATERAL DIAGNOSTIC URETEROSCOPY;  Surgeon: Crista Elliot, MD;  Location: [facility] ORS;  Service: Urology;  Laterality: Bilateral;   ESOPHAGOGASTRODUODENOSCOPY (EGD) WITH PROPOFOL N/A 02/07/2022   Procedure: ESOPHAGOGASTRODUODENOSCOPY (EGD) WITH PROPOFOL;  Surgeon: Meryl Dare, MD;  Location: [facility] ENDOSCOPY;  Service: Gastroenterology;  Laterality: N/A;   TRANSTHORACIC ECHOCARDIOGRAM  01/08/2011   EF 60-65%, moderae mitral regurg, LA mild-moderately dilated,   TRANSURETHRAL RESECTION OF BLADDER TUMOR N/A 04/04/2023   Procedure: TRANSURETHRAL  RESECTION OF BLADDER TUMOR (TURBT);  Surgeon: Crista Elliot, MD;  Location: [facility] ORS;  Service: Urology;  Laterality: N/A;  1 HR FOR CASE   Social History:  reports that she has quit smoking. Her smoking use included cigarettes. She has never used smokeless  tobacco. She reports that she does not drink alcohol and does not use drugs.  No Known Allergies  Family History  Problem Relation Age of Onset   Diabetes Mother    Hypertension Father    Hyperlipidemia Father    Diabetes Sister    Hypertension Sister    Hyperlipidemia Sister    Hypertension Brother    Stroke Sister    Hypertension Sister    Diabetes Sister    Cancer Daughter    Heart failure Child    Breast cancer Neg Hx     Prior to Admission medications   Medication Sig Start Date End Date Taking? Authorizing Provider  acetaminophen (TYLENOL) 500 MG tablet Take 500 mg by mouth every 6 (six) hours as needed for moderate pain (pain score 4-6).   Yes [provider]  amiodarone (PACERONE) 200 MG tablet TAKE 1 TABLET BY MOUTH EVERY DAY 08/09/23  Yes Hilty, Lisette Abu, MD  apixaban (ELIQUIS) 2.5 MG TABS tablet TAKE 1 TABLET BY MOUTH TWICE A DAY 02/06/23  Yes Hilty, Lisette Abu, MD  Ascorbic Acid (VITAMIN C) 100 MG tablet Take 100 mg by mouth daily.   Yes [provider]  Calcium Polycarbophil (FIBER-CAPS PO) Take 3-5 capsules by mouth as needed (constipation).   Yes [provider]  fosfomycin (MONUROL) 3 g PACK Take 3 g by mouth as directed. 09/20/23  Yes [provider]  furosemide (LASIX) 20 MG tablet Take 1 tablet (20 mg total) by mouth daily as needed for fluid or edema. 03/24/23  Yes Wittenborn, Deborah, NP  insulin aspart (NOVOLOG FLEXPEN) 100 UNIT/ML FlexPen Inject 8 Units into the skin 3 (three) times daily with meals. Patient taking differently: Inject 11 Units into the skin See admin instructions. If BG is >220 08/04/23  Yes Marinda Elk, MD  insulin glargine, 2 Unit Dial, (TOUJEO MAX SOLOSTAR) 300 UNIT/ML Solostar Pen Inject 14 Units into the skin at bedtime. Patient taking differently: Inject 12 Units into the skin at bedtime. 08/04/23  Yes Marinda Elk, MD  KLOR-CON M20 20 MEQ tablet Take 20 mEq by mouth daily. 03/22/23  Yes  [provider]  levothyroxine (SYNTHROID) 100 MCG tablet Take 100 mcg by mouth daily before breakfast.   Yes [provider]  levothyroxine (SYNTHROID) 112 MCG tablet Take 112 mcg by mouth daily before breakfast.   Yes [provider]  mirtazapine (REMERON) 30 MG tablet Take 30 mg by mouth at bedtime as needed (sleep).   Yes [provider]  nitrofurantoin, macrocrystal-monohydrate, (MACROBID) 100 MG capsule Take 100 mg by mouth at bedtime. 07/13/23  Yes [provider]  TIADYLT ER 180 MG 24 hr capsule TAKE 1 CAPSULE BY MOUTH EVERY DAY 11/01/22  Yes Hilty, Lisette Abu, MD  trimethoprim (TRIMPEX) 100 MG tablet Take 100 mg by mouth daily. 10/27/22  Yes [provider]  amiodarone (PACERONE) 100 MG tablet Take 1 tablet (100 mg total) by mouth daily. Patient not taking: Reported on 09/07/2023 06/15/23   Carlos Levering, NP  pantoprazole (PROTONIX) 40 MG tablet Take 1 tablet (40 mg total) by mouth daily at 6 (six) AM. Patient not taking: Reported on 09/24/2023 02/09/22   Osvaldo Shipper, MD  Physical Exam: Vitals:   09/24/23 1230 09/24/23 1233  BP: 135/61   Pulse: 80   Resp: 20   Temp: 98 F (36.7 C)   SpO2: 98%   Weight:  51 kg  Height:  5\' 2"  (1.575 m)   General: Pleasant, well-appearing elderly woman laying in bed. No acute distress. HEENT: [place]/AT. Anicteric sclera CV: RRR. No murmurs, rubs, or gallops. Trace BLE edema up to her ankles. Pulmonary: Lungs CTAB. Normal effort. No wheezing or rales. Abdominal: Soft, nontender, nondistended. Normal bowel sounds. Extremities: Palpable radial and DP pulses. Normal ROM. Skin: Warm and dry. No obvious rash or lesions. Neuro: A&Ox3. Moves all extremities. Normal sensation to light touch. No focal deficit. Psych: Normal mood and affect  Data Reviewed: Labs show sodium 128 (Katz corrected Na of 133), K+ 5.6, bicarb 24, glucose 406, creatinine 1.11, WBC 6.3, Hgb 11.2, platelet 200, UA shows  significant glucosuria, no hemoglobinuria, negative nitrate, negative leuk, WBC 0-5 no bacteria. EKG shows normal sinus rhythm with LAE   Assessment and Plan: [name] is a 88 y.o. female with medical history significant of HTN, LADA (managed as T1DM), HLD, A-fib on Eliquis, hypothyroidism, and chronic cystitis on daily antibiotics who presents to the ED after urine culture grew ESBL Klebsiella pneumoniae and admitted for IV antibiotics  # UTI with ESBL Klebsiella pneumonia Patient with chronic cystitis on daily trimethoprim followed by urology found to have a recent urine culture with ESBL Klebsiella pneumonia that is only sensitive to a few antibiotics including meropenem. Patient afebrile, hemodynamically stable. UA on admission without any signs of infection. 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  She follows endocrinology at [facility].  Patient reports she is currently taking Toujeo 12 units at bedtime and NovoLog 11 units with meals as well as sliding scale. CBG elevated to 376. -One-time dose of NovoLog 6 units -Semglee 12 units at bedtime -SSI with meals, CBG monitoring  # Chronic hyponatremia # Pseudohyponatremia Patient with a history of chronic mild hyponatremia with sodium 130-133 over the last year. She has also had intermittent pseudohyponatremia in the setting of her elevated blood sugars. Sodium of 128 on admission but corrects to 133 with blood sugar of 406.  -Repeat BMP after improvement in blood sugar  # Hyperkalemia  K+ of 5.6 on admission. Kidney function improving. Patient has a history of hypokalemia and has been taking 20 mEq of KCl daily which is likely the cause of her hyperkalemia. -Hold potassium supplementation -Anticipate improvement with insulin -Consider Lokelma if  K+ persistently elevated tomorrow  # A-fib Currently rhythm and rate controlled. HR stable in the 70s and 80s. She does report feeling more fatigue over the last few weeks. -Continue amiodarone, diltiazem and Eliquis  # Hypothyroidism -Continue Synthroid -Repeat TSH  # Generalized weakness Reports feeling weak and fatigue over the last few weeks. -Follow-up TSH, mag, Phos, vitamin D -PT/OT eval and treat  # BLE edema Patient on Lasix 20 mg as needed for leg swelling and KCl 20 mEq daily.  She does have trace BLE edema on exam.  She denies shortness of breath or dyspnea on exertion. -Hold Lasix today -Apply TED hose    Advance Care Planning:   Code Status: Full Code   Consults: Urology  Family Communication: No family at bedside  Severity of Illness: The appropriate  patient status for this patient is OBSERVATION. Observation status is judged to be reasonable and necessary in order to provide the required intensity of service to ensure the patient's safety. The patient's presenting symptoms, physical exam findings, and initial radiographic and laboratory data in the context of their medical condition is felt to place them at decreased risk for further clinical deterioration. Furthermore, it is anticipated that the patient will be medically stable for discharge from the hospital within 2 midnights of admission.   Author: Steffanie Rainwater, MD 09/24/2023 3:44 PM  For on call review [email or web address].

## 2023-09-24 NOTE — ED Notes (Signed)
Following up due to pt upset and urinating on self, at first she was reluctant to allow staff to help her change and put on dry pants, after talking with her a few minutes she was in agreement. She was provided with blue pants and a diaper.

## 2023-09-24 NOTE — ED Provider Notes (Signed)
[facility]PARTMENT AT [facility] Provider Note   CSN: [id number] Arrival date & time: 09/24/23  1214     History  Chief Complaint  Patient presents with   Hematuria    [name] is a 88 y.o. female.  88 year old female with past medical history of atrial fibrillation on Eliquis presenting to the emergency department today with persistent urinary frequency and urgency as well as intermittent hematuria.  The patient was seen earlier this month and had a bladder biopsy.  She reports he has been having some intermittent bleeding as well as some urinary frequency and urgency since then.  She has been treated with antibiotics as an outpatient.  She was called by her urologist yesterday and was told that she needed to come to the hospital for treatment with IV antibiotics.  The patient is showing appeared today for this.  She denies any fevers.  She apparently was growing ESBL and was sent here for meropenem.   Hematuria       Home Medications Prior to Admission medications   Medication Sig Start Date End Date Taking? Authorizing Provider  amiodarone (PACERONE) 200 MG tablet TAKE 1 TABLET BY MOUTH EVERY DAY 08/09/23  Yes Hilty, Lisette Abu, MD  apixaban (ELIQUIS) 2.5 MG TABS tablet TAKE 1 TABLET BY MOUTH TWICE A DAY 02/06/23  Yes Hilty, Lisette Abu, MD  furosemide (LASIX) 20 MG tablet Take 1 tablet (20 mg total) by mouth daily as needed for fluid or edema. 03/24/23  Yes Wittenborn, Deborah, NP  insulin aspart (NOVOLOG FLEXPEN) 100 UNIT/ML FlexPen Inject 8 Units into the skin 3 (three) times daily with meals. Patient taking differently: Inject 11 Units into the skin See admin instructions. If BG is >220 08/04/23  Yes Marinda Elk, MD  insulin glargine, 2 Unit Dial, (TOUJEO MAX SOLOSTAR) 300 UNIT/ML Solostar Pen Inject 14 Units into the skin at bedtime. Patient taking differently: Inject 12 Units into the skin at bedtime. 08/04/23  Yes Marinda Elk, MD   acetaminophen (TYLENOL) 500 MG tablet Take 500 mg by mouth every 6 (six) hours as needed for moderate pain (pain score 4-6). Patient not taking: Reported on 09/07/2023    [provider]  amiodarone (PACERONE) 100 MG tablet Take 1 tablet (100 mg total) by mouth daily. Patient not taking: Reported on 09/07/2023 06/15/23   Carlos Levering, NP  Ascorbic Acid (VITAMIN C) 100 MG tablet Take 100 mg by mouth daily.    [provider]  Calcium Polycarbophil (FIBER-CAPS PO) Take 3-5 capsules by mouth as needed (constipation).    [provider]  KLOR-CON M20 20 MEQ tablet Take 20 mEq by mouth daily. 03/22/23   [provider]  levothyroxine (SYNTHROID) 100 MCG tablet Take 100 mcg by mouth daily before breakfast.    [provider]  levothyroxine (SYNTHROID) 112 MCG tablet Take 112 mcg by mouth daily before breakfast. Patient not taking: Reported on 09/07/2023    [provider]  mirtazapine (REMERON) 30 MG tablet Take 30 mg by mouth at bedtime as needed (sleep).    [provider]  nitrofurantoin, macrocrystal-monohydrate, (MACROBID) 100 MG capsule Take 100 mg by mouth at bedtime. 07/13/23   [provider]  pantoprazole (PROTONIX) 40 MG tablet Take 1 tablet (40 mg total) by mouth daily at 6 (six) AM. 02/09/22   Osvaldo Shipper, MD  TIADYLT ER 180 MG 24 hr capsule TAKE 1 CAPSULE BY MOUTH EVERY DAY 11/01/22   Hilty, Lisette Abu, MD  trimethoprim (TRIMPEX) 100 MG tablet Take 100 mg by mouth daily. 10/27/22   [provider]      Allergies    Patient has no known allergies.    Review of Systems   Review of Systems  Genitourinary:  Positive for frequency, hematuria and urgency.  All other systems reviewed and are negative.   Physical Exam Updated Vital Signs BP 135/61   Pulse 80   Temp 98 F (36.7 C)   Resp 20   Ht 5\' 2"  (1.575 m)   Wt 51 kg   SpO2 98%   BMI 20.56 kg/m  Physical Exam Vitals and nursing note  reviewed.   Gen: NAD Eyes: PERRL, EOMI HEENT: no oropharyngeal swelling Neck: trachea midline Resp: clear to auscultation bilaterally Card: RRR, no murmurs, rubs, or gallops Abd: nontender, nondistended Extremities: no calf tenderness, no edema Vascular: 2+ radial pulses bilaterally, 2+ DP pulses bilaterally Skin: no rashes Psyc: acting appropriately   ED Results / Procedures / Treatments   Labs (all labs ordered are listed, but only abnormal results are displayed) Labs Reviewed  CBC WITH DIFFERENTIAL/PLATELET - Abnormal; Notable for the following components:      Result Value   Hemoglobin 11.2 (*)    HCT 35.1 (*)    All other components within normal limits  URINE CULTURE  CULTURE, BLOOD (ROUTINE X 2)  CULTURE, BLOOD (ROUTINE X 2)  BASIC METABOLIC PANEL  URINALYSIS, ROUTINE W REFLEX MICROSCOPIC    EKG None  Radiology No results found.  Procedures Procedures    Medications Ordered in ED Medications  meropenem (MERREM) 500 mg in sodium chloride 0.9 % 100 mL IVPB (has no administration in time range)    ED Course/ Medical Decision Making/ A&P                                 Medical Decision Making 88 year old female with past medical history of diabetes, hypertension, hyperlipidemia, and atrial fibrillation on Eliquis presenting to the emergency department today with intermittent hematuria and persistent urinary symptoms despite multiple rounds of oral antibiotics.  I will repeat her labs here as well as urinalysis and urine culture.  I will touch base with urology as we do not have the results here in our system.  I will reevaluate for ultimate disposition.  Discussed patient's case with urology.  They were able to upload a copy of her results.  Does appear that the patient has ESBL and is resistant to multiple antibiotics with exception of meropenem.  This is ordered here.  The patient's labs are pending at the time of signout.  Signed out to Dr. Maple Hudson at 1500.   The plan is for admission for IV antibiotics.  Amount and/or Complexity of Data Reviewed Labs: ordered.           Final Clinical Impression(s) / ED Diagnoses Final diagnoses:  UTI due to extended-spectrum beta lactamase (ESBL) producing Escherichia coli    Rx / DC Orders ED Discharge Orders     None         Durwin Glaze, MD 09/24/23 1454

## 2023-09-25 DIAGNOSIS — N390 Urinary tract infection, site not specified: Secondary | ICD-10-CM | POA: Diagnosis not present

## 2023-09-25 DIAGNOSIS — B9689 Other specified bacterial agents as the cause of diseases classified elsewhere: Secondary | ICD-10-CM | POA: Diagnosis not present

## 2023-09-25 LAB — GLUCOSE, CAPILLARY
Glucose-Capillary: 58 mg/dL — ABNORMAL LOW (ref 70–99)
Glucose-Capillary: 54 mg/dL — ABNORMAL LOW (ref 70–99)
Glucose-Capillary: 82 mg/dL (ref 70–99)
Glucose-Capillary: 134 mg/dL — ABNORMAL HIGH (ref 70–99)
Glucose-Capillary: 184 mg/dL — ABNORMAL HIGH (ref 70–99)
Glucose-Capillary: 222 mg/dL — ABNORMAL HIGH (ref 70–99)
Glucose-Capillary: 198 mg/dL — ABNORMAL HIGH (ref 70–99)

## 2023-09-25 LAB — CBC
HCT: 29.5 % — ABNORMAL LOW (ref 36.0–46.0)
Hemoglobin: 9.5 g/dL — ABNORMAL LOW (ref 12.0–15.0)
MCH: 28.7 pg (ref 26.0–34.0)
MCHC: 32.2 g/dL (ref 30.0–36.0)
MCV: 89.1 fL (ref 80.0–100.0)
Platelets: 196 10*3/uL (ref 150–400)
RBC: 3.31 MIL/uL — ABNORMAL LOW (ref 3.87–5.11)
RDW: 14.5 % (ref 11.5–15.5)
WBC: 5.4 10*3/uL (ref 4.0–10.5)
nRBC: 0 % (ref 0.0–0.2)

## 2023-09-25 LAB — COMPREHENSIVE METABOLIC PANEL
ALT: 26 U/L (ref 0–44)
AST: 28 U/L (ref 15–41)
Albumin: 2.4 g/dL — ABNORMAL LOW (ref 3.5–5.0)
Alkaline Phosphatase: 71 U/L (ref 38–126)
Anion gap: 5 (ref 5–15)
BUN: 20 mg/dL (ref 8–23)
CO2: 25 mmol/L (ref 22–32)
Calcium: 8.1 mg/dL — ABNORMAL LOW (ref 8.9–10.3)
Chloride: 98 mmol/L (ref 98–111)
Creatinine, Ser: 0.67 mg/dL (ref 0.44–1.00)
GFR, Estimated: >60 mL/min (ref >60)
Glucose, Bld: 168 mg/dL — ABNORMAL HIGH (ref 70–99)
Potassium: 4.5 mmol/L (ref 3.5–5.1)
Sodium: 128 mmol/L — ABNORMAL LOW (ref 135–145)
Total Bilirubin: 0.4 mg/dL (ref <1.2)
Total Protein: 5.2 g/dL — ABNORMAL LOW (ref 6.5–8.1)

## 2023-09-25 LAB — VITAMIN D 25 HYDROXY (VIT D DEFICIENCY, FRACTURES): Vit D, 25-Hydroxy: 31.59 ng/mL (ref 30–100)

## 2023-09-25 MED ORDER — SODIUM CHLORIDE 0.9 % IV SOLN
1.0000 g | Freq: Two times a day (BID) | INTRAVENOUS | Status: DC
  Administered 2023-09-25 – 2023-09-27 (×4): 1 g via INTRAVENOUS
  Filled 2023-09-25 (×6): qty 20

## 2023-09-25 NOTE — Hospital Course (Signed)
[name] is a 88 y.o. female with medical history significant of hypertension, diabetes mellitus, hyperlipidemia, atrial fibrillation on Eliquis, hypothyroidism and chronic cystitis on daily antibiotic presented to hospital with urinary frequency urgency and intermittent hematuria.  Of note she was recently seen by urology as outpatient and had a biopsy of the bladder done on 09/12/2023.  She had been having intermittent hematuria since then.  In the ED she also complained of generalized weakness and UA and urine culture were collected.  Patient was then discharged home with Keflex but since urine culture came out to be positive for  ESBL Klebsiella pneumonia only susceptible to Avycaz, imipenem, meropenem and Vabomere, she was advised to come to the ED for IV meropenem.  In the ED vitals were stable.  Patient had mild hyponatremia at 133.  WBC at 6.3.  Urinalysis showed significant glycosuria negative nitrate.  EKG showed normal sinus rhythm.  Patient was then considered for admission to hospital for further evaluation and treatment.    Assessment and Plan:  Symptomatic UTI with ESBL Klebsiella pneumonia Patient with chronic cystitis on daily trimethoprim followed by urology found to have a recent urine culture with ESBL Klebsiella pneumonia that is only sensitive to a few antibiotics including meropenem.  Continue IV meropenem.  Repeat urine culture blood cultures pending.-   Latent autoimmune diabetes type 1 with hyperglycemia. Being managed as type 1 diabetes care at endocrinology in the [facility].  Patient is on Toujeo 12 units at bedtime and NovoLog 11 units with meals as well as sliding scale.  Continue to monitor closely.   # Chronic hyponatremia Baseline sodium level between 1 30-1 33.  Will continue to monitor.  Hyperkalemia Improved.  Latest potassium of 4.4.   Paroxysmal atrial fibrillation. Rate controlled on amiodarone, diltiazem and Eliquis   Hypothyroidism -Continue Synthroid.   TSH at 11.8.  Will need outpatient monitoring for acute illnesses improved.   Generalized weakness Fatigue and weakness over the last few weeks.  TSH is slightly elevated.  Add T3-T4, Magnesium phosphorus vitamin D within normal limit.  PT OT evaluation and treatment.  BLE edema On Lasix and potassium as outpatient.  Lasix currently on hold.

## 2023-09-25 NOTE — Evaluation (Signed)
Occupational Therapy Evaluation Patient Details Name: [name] MRN: [id number] DOB: October 14, 1934 Today's Date: 09/25/2023   History of Present Illness [name] is a 88 yr old female brought to the hospital with ongoing urinary urgency, intermittent hematuria, and after her urine cultures were noted to have ESBL Klebsiella pneumoniae. PMH: HTN, LADA, a fib, chronic cystitis   Clinical Impression   The pt is currently limited by the below listed deficits which compromise her her ADL performance and overall functional independence (see OT problem list). During the session today, she required CGA to min assist for assessed tasks, including sit to stand, simulated toileting, and ambulating in the hall without an assistive device. She also appeared to be with slight deconditioning & occasional unsteadiness in standing. She will benefit from OT services in the acute care setting, to maximize her independence with self-care tasks & to facilitate her safe return home. Anticipate she will need post-acute home health therapy vs. no follow-up therapy needs, pending functional progress during her hospital stay.        If plan is discharge home, recommend the following: Assist for transportation;Assistance with cooking/housework    Functional Status Assessment  Patient has had a recent decline in their functional status and demonstrates the ability to make significant improvements in function in a reasonable and predictable amount of time.  Equipment Recommendations  Tub/shower seat    Recommendations for Other Services       Precautions / Restrictions Restrictions Weight Bearing Restrictions Per Provider Order: No      Mobility Bed Mobility Overal bed mobility: Needs Assistance Bed Mobility: Supine to Sit     Supine to sit: Supervision          Transfers Overall transfer level: Needs assistance Equipment used: None Transfers: Sit to/from Stand Sit to Stand: Contact guard  assist                  Balance     Sitting balance-Leahy Scale: Good         Standing balance comment: CGA to min assist             ADL either performed or assessed with clinical judgement   ADL Overall ADL's : Needs assistance/impaired Eating/Feeding: Independent;Sitting   Grooming: Contact guard assist;Standing           Upper Body Dressing : Set up;Sitting   Lower Body Dressing: Contact guard assist;Sit to/from stand   Toilet Transfer: Contact guard assist;Ambulation   Toileting- Clothing Manipulation and Hygiene: Contact guard assist;Sit to/from stand                Pertinent Vitals/Pain Pain Assessment Pain Assessment: No/denies pain     Extremity/Trunk Assessment Upper Extremity Assessment Upper Extremity Assessment: Overall WFL for tasks assessed   Lower Extremity Assessment Lower Extremity Assessment: Overall WFL for tasks assessed       Communication Communication Communication: No apparent difficulties   Cognition Arousal: Alert Behavior During Therapy: WFL for tasks assessed/performed Overall Cognitive Status: Within Functional Limits for tasks assessed                     Home Living Family/patient expects to be discharged to:: Private residence Living Arrangements: Alone Available Help at Discharge: Family Type of Home: House Home Access: Level entry (level entry into walk out basement; flight up to main level of home)     Home Layout: Laundry or work area in basement     [employer] Shower/Tub: Barista  unit         Home Equipment: Rollator (4 wheels);BSC/3in1          Prior Functioning/Environment Prior Level of Function : Independent/Modified Independent;Driving;Working/employed             Mobility Comments: She occasionally used a rollator for ambulation. ADLs Comments: She was independent with ADLs, driving, cooking light meals, and cleaning. She works ~9 hrs per week.        OT  Problem List: Decreased strength;Impaired balance (sitting and/or standing);Decreased knowledge of use of DME or AE      OT Treatment/Interventions: Self-care/ADL training;Therapeutic exercise;Energy conservation;DME and/or AE instruction;Patient/family education;Balance training;Therapeutic activities    OT Goals(Current goals can be found in the care plan section) Acute Rehab OT Goals OT Goal Formulation: With patient Time For Goal Achievement: 10/09/23 Potential to Achieve Goals: Good ADL Goals Pt Will Perform Grooming: with modified independence;standing Pt Will Perform Lower Body Dressing: with modified independence;sit to/from stand Pt Will Transfer to Toilet: with modified independence;ambulating Pt Will Perform Toileting - Clothing Manipulation and hygiene: with modified independence;sit to/from stand  OT Frequency: Min 1X/week    Co-evaluation PT/OT/SLP Co-Evaluation/Treatment: Yes Reason for Co-Treatment: To address functional/ADL transfers PT goals addressed during session: Mobility/safety with mobility OT goals addressed during session: ADL's and self-care      AM-PAC OT "6 Clicks" Daily Activity     Outcome Measure Help from another person eating meals?: None Help from another person taking care of personal grooming?: A Little Help from another person toileting, which includes using toliet, bedpan, or urinal?: A Little Help from another person bathing (including washing, rinsing, drying)?: A Little Help from another person to put on and taking off regular upper body clothing?: A Little Help from another person to put on and taking off regular lower body clothing?: A Little 6 Click Score: 19   End of Session Equipment Utilized During Treatment: Gait belt Nurse Communication: Mobility status  Activity Tolerance: Patient tolerated treatment well Patient left: in chair;with call bell/phone within reach;with chair alarm set  OT Visit Diagnosis: Unsteadiness on feet  (R26.81);Muscle weakness (generalized) (M62.81)                Time: 3151-7616 OT Time Calculation (min): 28 min Charges:  OT General Charges $OT Visit: 1 Visit OT Evaluation $OT Eval Moderate Complexity: 1 Mod    [name], OTR/L 09/25/2023, 1:59 PM

## 2023-09-25 NOTE — Evaluation (Signed)
Physical Therapy Evaluation Patient Details Name: Alizae Hervey MRN: [id number] DOB: 11/29/1934 Today's Date: 09/25/2023  History of Present Illness  Mrs. Whisonant is a 88 yr old female brought to the hospital with ongoing urinary urgency, intermittent hematuria, and after her urine cultures were noted to have ESBL Klebsiella pneumoniae. PMH: HTN, LADA, a fib, chronic cystitis  Clinical Impression  Pt admitted as above an presenting with functional mobility limitations 2* generalized weakness, decreased activity tolerance and ambulatory balance deficits.  During the session today, she required CGA to min assist for assessed tasks, including sit to stand and ambulating in the hall without an assistive device. She also appeared to be with slight deconditioning & occasional unsteadiness in standing. She will benefit from PT services in the acute care setting, to maximize her independence and safety for return home. Anticipate she will need post-acute home health therapy vs. no follow-up therapy needs, pending functional progress during her hospital stay.        If plan is discharge home, recommend the following: A little help with walking and/or transfers;A little help with bathing/dressing/bathroom;Assistance with cooking/housework;Assist for transportation;Help with stairs or ramp for entrance   Can travel by private vehicle        Equipment Recommendations None recommended by PT  Recommendations for Other Services       Functional Status Assessment Patient has had a recent decline in their functional status and demonstrates the ability to make significant improvements in function in a reasonable and predictable amount of time.     Precautions / Restrictions Precautions Precautions: Fall Restrictions Weight Bearing Restrictions Per Provider Order: No      Mobility  Bed Mobility Overal bed mobility: Needs Assistance Bed Mobility: Supine to Sit     Supine to sit: Supervision           Transfers Overall transfer level: Needs assistance Equipment used: None Transfers: Sit to/from Stand Sit to Stand: Contact guard assist           General transfer comment: Steady assist only    Ambulation/Gait Ambulation/Gait assistance: Min assist Gait Distance (Feet): 375 Feet Assistive device: 1 person hand held assist Gait Pattern/deviations: Step-through pattern, Shuffle Gait velocity: decr     General Gait Details: Steady assist and HHA for stability.  Stairs            Wheelchair Mobility     Tilt Bed    Modified Rankin (Stroke Patients Only)       Balance Overall balance assessment: Needs assistance Sitting-balance support: No upper extremity supported, Feet supported Sitting balance-Leahy Scale: Good     Standing balance support: No upper extremity supported Standing balance-Leahy Scale: Fair                               Pertinent Vitals/Pain Pain Assessment Pain Assessment: No/denies pain    Home Living Family/patient expects to be discharged to:: Private residence Living Arrangements: Alone Available Help at Discharge: Family Type of Home: House Home Access: Level entry       Home Layout: Laundry or work area in basement Home Equipment: Rollator (4 wheels);BSC/3in1 Additional Comments: Has basement and sometimes does stairs but frequently drives car around to back of house where she has level entry basement access    Prior Function Prior Level of Function : Independent/Modified Independent;Driving;Working/employed             Mobility Comments: She occasionally use a rollator  for ambulation. ADLs Comments: She was independent with ADLs, driving, cooking light meals, and cleaning. She works ~9 hrs per week.     Extremity/Trunk Assessment   Upper Extremity Assessment Upper Extremity Assessment: Overall WFL for tasks assessed    Lower Extremity Assessment Lower Extremity Assessment: Overall WFL for tasks  assessed    Cervical / Trunk Assessment Cervical / Trunk Assessment: Normal  Communication   Communication Communication: No apparent difficulties  Cognition Arousal: Alert Behavior During Therapy: WFL for tasks assessed/performed Overall Cognitive Status: Within Functional Limits for tasks assessed                                          General Comments      Exercises     Assessment/Plan    PT Assessment Patient needs continued PT services  PT Problem List Decreased activity tolerance;Decreased balance;Decreased mobility       PT Treatment Interventions DME instruction;Gait training;Stair training;Functional mobility training;Therapeutic activities;Therapeutic exercise;Balance training;Patient/family education    PT Goals (Current goals can be found in the Care Plan section)  Acute Rehab PT Goals Patient Stated Goal: Regain IND PT Goal Formulation: With patient Time For Goal Achievement: 10/02/23 Potential to Achieve Goals: Good    Frequency Min 1X/week     Co-evaluation PT/OT/SLP Co-Evaluation/Treatment: Yes Reason for Co-Treatment: To address functional/ADL transfers PT goals addressed during session: Mobility/safety with mobility OT goals addressed during session: ADL's and self-care       AM-PAC PT "6 Clicks" Mobility  Outcome Measure Help needed turning from your back to your side while in a flat bed without using bedrails?: None Help needed moving from lying on your back to sitting on the side of a flat bed without using bedrails?: None Help needed moving to and from a bed to a chair (including a wheelchair)?: A Little Help needed standing up from a chair using your arms (e.g., wheelchair or bedside chair)?: A Little Help needed to walk in hospital room?: A Little Help needed climbing 3-5 steps with a railing? : A Lot 6 Click Score: 19    End of Session Equipment Utilized During Treatment: Gait belt Activity Tolerance: Patient  tolerated treatment well Patient left: in chair;with call bell/phone within reach;with chair alarm set Nurse Communication: Mobility status PT Visit Diagnosis: Unsteadiness on feet (R26.81);Difficulty in walking, not elsewhere classified (R26.2)    Time: 2542-7062 PT Time Calculation (min) (ACUTE ONLY): 30 min   Charges:   PT Evaluation $PT Eval Low Complexity: 1 Low   PT General Charges $$ ACUTE PT VISIT: 1 Visit         Mauro Kaufmann PT Acute Rehabilitation Services Pager [phone] Office [phone]   Arlo Butt 09/25/2023, 2:10 PM

## 2023-09-25 NOTE — Progress Notes (Signed)
Pharmacy Antibiotic Note  [name] is a 88 y.o. female admitted on 09/24/2023 with an ESBL UTI. Ucx in the chart (found in the media tab) were taken 12/6 at an outpatient facility and show ESBL Klebsiella pneumoniae--susceptible to Avycaz, imipenem, meropenem, Vabomere. Pharmacy has been consulted for meropenem dosing.  SCr improved today.  Plan: Increase meropenem to 1g IV q12hrs  Monitor clinical picture, renal function F/U C&S, abx deescalation / LOT  Height: 5\' 2"  (157.5 cm) Weight: 51 kg (112 lb 7 oz) IBW/kg (Calculated) : 50.1  Temp (24hrs), Avg:98 F (36.7 C), Min:97.8 F (36.6 C), Max:98.1 F (36.7 C)  Recent Labs  Lab 09/24/23 1435 09/25/23 0625  WBC 6.3 5.4  CREATININE 1.11* 0.67    Estimated Creatinine Clearance: 39.9 mL/min (by C-G formula based on SCr of 0.67 mg/dL).    No Known Allergies  Antimicrobials this admission: 12/14 meropenem >>   Dose adjustments this admission: N/A  Microbiology results: 12/14 BCx: sent 12/14 Ucx: sent 12/6 UCx: ESBL K pneumo   Thank you for allowing pharmacy to be a part of this patient's care.  Enzo Bi, PharmD, BCPS, BCIDP Clinical Pharmacist 09/25/2023 9:42 AM

## 2023-09-25 NOTE — Progress Notes (Addendum)
PROGRESS NOTE    [name]  GMW:[id number] DOB: [birth year] DOA: 09/24/2023 PCP: [name], MD    Brief Narrative:    [name] is a 88 y.o. female with medical history significant of hypertension, diabetes mellitus, hyperlipidemia, atrial fibrillation on Eliquis, hypothyroidism and chronic cystitis on daily antibiotic presented to hospital with urinary frequency urgency and intermittent hematuria.  Of note she was recently seen by urology as outpatient and had a biopsy of the bladder done on 09/12/2023.  She had been having intermittent hematuria since then.  In the ED she also complained of generalized weakness and UA and urine culture were collected.  Patient was then discharged home with Keflex but since urine culture came out to be positive for  ESBL Klebsiella pneumonia only susceptible to Avycaz, imipenem, meropenem and Vabomere, she was advised to come to the ED for IV meropenem.  In the ED vitals were stable.  Patient had mild hyponatremia at 133.  WBC at 6.3.  Urinalysis showed significant glycosuria negative nitrate.  EKG showed normal sinus rhythm.  Patient was then considered for admission to hospital for further evaluation and treatment.    Assessment and Plan:  Symptomatic UTI with ESBL Klebsiella pneumonia Patient with chronic cystitis on daily trimethoprim followed by urology found to have a recent urine culture with ESBL Klebsiella pneumonia that is only sensitive to a few antibiotics including meropenem.  Urine culture was done 09/16/2023.  Was on outpatient Keflex which has been discontinued.  Continue IV meropenem.  Repeat urine culture blood cultures have been sent, pending.  Will reach out to infectious disease for further opinion.   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 Being managed as type [facility] diabetes care [facility]  Patient is on Toujeo 12 units at bedtime and NovoLog 11 units with meals as well as sliding scale.  Continue to  monitor closely.  Bladder cancer.  Seen by urology Dr. Alvester Morin as outpatient on 09/12/2023 and underwent cystoscopy with bladder biopsy and fulguration of medium bladder tumor with bilateral retrograde pyelogram.  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  Will need to follow-up with urology as outpatient.   # Chronic hyponatremia Baseline sodium level between 1 30-1 33.  Will continue to monitor.  Hyperkalemia Improved.  Latest potassium of 4.4.   Paroxysmal atrial fibrillation. Rate controlled on amiodarone, diltiazem and Eliquis   Hypothyroidism -Continue Synthroid.  TSH at 11.8.  Will need outpatient monitoring for acute illnesses improved.   Generalized weakness Fatigue and weakness over the last few weeks.  TSH is slightly elevated.  Add T3-T4, Magnesium phosphorus vitamin D within normal limit.  PT OT evaluation and treatment.  BLE edema On Lasix and potassium as outpatient.  Lasix currently on hold.     DVT prophylaxis: apixaban (ELIQUIS) tablet 2.5 mg Start: 09/24/23 2200 Place TED hose Start: 09/24/23 1729 apixaban (ELIQUIS) tablet 2.5 mg   Code Status:     Code Status: Full Code  Disposition: Likely home in 1 to 2 days.  Status is: Observation  The patient will require care spanning > 2 midnights and should be moved to inpatient because: IV antibiotic,   Family Communication: Spoke with the patient's daughter on the phone and updated her about the clinical condition of the patient.  Consultants:  Will reach out to to ID for opinion  Procedures:  None  Antimicrobials:  Meropenem IV  Anti-infectives (From admission, onward)  Start     Dose/Rate Route Frequency Ordered Stop   09/25/23 1200  meropenem (MERREM) 1 g in sodium chloride 0.9 % 100 mL IVPB        1 g 200 mL/hr over 30 Minutes Intravenous Every 12 hours 09/25/23 0943     09/24/23 1400  meropenem (MERREM) 500 mg in sodium chloride 0.9 % 100 mL  IVPB  Status:  Discontinued        500 mg 200 mL/hr over 30 Minutes Intravenous Every 12 hours 09/24/23 1336 09/25/23 0943        Subjective: Today, patient was seen and examined at bedside.  Patient states that she does have some hematuria but has less dysuria urgency today.  No fever chills or rigor.  No shortness of breath cough or congestion.  Objective: Vitals:   09/24/23 2145 09/25/23 0233 09/25/23 0649 09/25/23 0959  BP: (!) 116/58 (!) 109/50 (!) 99/58 119/60  Pulse: 72 68 61 66  Resp: 18 18 18    Temp: 98.1 F (36.7 C) 98.1 F (36.7 C) 98.1 F (36.7 C)   TempSrc: Oral Oral Oral   SpO2: 98% 97% 100%   Weight:      Height:        Intake/Output Summary (Last 24 hours) at 09/25/2023 1054 Last data filed at 09/25/2023 0900 Gross per 24 hour  Intake 317.1 ml  Output --  Net 317.1 ml   Filed Weights   09/24/23 1233  Weight: 51 kg    Physical Examination: Body mass index is 20.56 kg/m.  General:  Average built, not in obvious distress, elderly female HENT:   No scleral pallor or icterus noted. Oral mucosa is moist.  Chest:  Clear breath sounds.  No crackles or wheezes.  CVS: S1 &S2 heard. No murmur.  Regular rate and rhythm. Abdomen: Soft, nontender, nondistended.  Bowel sounds are heard.   Extremities: No cyanosis, clubbing or edema.  Peripheral pulses are palpable. Psych: Alert, awake and oriented, normal mood CNS:  No cranial nerve deficits.  Power equal in all extremities.   Skin: Warm and dry.  No rashes noted.  Data Reviewed:   CBC: Recent Labs  Lab 09/24/23 1435 09/25/23 0625  WBC 6.3 5.4  NEUTROABS 4.8  --   HGB 11.2* 9.5*  HCT 35.1* 29.5*  MCV 89.5 89.1  PLT 200 196    Basic Metabolic Panel: Recent Labs  Lab 09/24/23 1435 09/24/23 1929 09/25/23 0625  NA 128*  --  128*  K 5.6*  --  4.5  CL 96*  --  98  CO2 24  --  25  GLUCOSE 406*  --  168*  BUN 23  --  20  CREATININE 1.11*  --  0.67  CALCIUM 8.4*  --  8.1*  MG  --  2.4  --    PHOS  --  2.6  --     Liver Function Tests: Recent Labs  Lab 09/25/23 0625  AST 28  ALT 26  ALKPHOS 71  BILITOT 0.4  PROT 5.2*  ALBUMIN 2.4*     Radiology Studies: No results found.    LOS: 0 days     Joycelyn Das, MD [facility] Available via Epic secure chat 7am-7pm After these hours, please refer to coverage provider listed on [email or web address] 09/25/2023, 10:54 AM

## 2023-09-26 DIAGNOSIS — Z794 Long term (current) use of insulin: Secondary | ICD-10-CM | POA: Diagnosis not present

## 2023-09-26 DIAGNOSIS — Z1612 Extended spectrum beta lactamase (ESBL) resistance: Secondary | ICD-10-CM

## 2023-09-26 DIAGNOSIS — B9629 Other Escherichia coli [E. coli] as the cause of diseases classified elsewhere: Secondary | ICD-10-CM | POA: Diagnosis not present

## 2023-09-26 DIAGNOSIS — E16A1 Hypoglycemia level 1: Secondary | ICD-10-CM | POA: Diagnosis not present

## 2023-09-26 DIAGNOSIS — Z7901 Long term (current) use of anticoagulants: Secondary | ICD-10-CM | POA: Diagnosis not present

## 2023-09-26 DIAGNOSIS — I480 Paroxysmal atrial fibrillation: Secondary | ICD-10-CM | POA: Diagnosis present

## 2023-09-26 DIAGNOSIS — Z79890 Hormone replacement therapy (postmenopausal): Secondary | ICD-10-CM | POA: Diagnosis not present

## 2023-09-26 DIAGNOSIS — Z860100 Personal history of colon polyps, unspecified: Secondary | ICD-10-CM | POA: Diagnosis not present

## 2023-09-26 DIAGNOSIS — E785 Hyperlipidemia, unspecified: Secondary | ICD-10-CM | POA: Diagnosis present

## 2023-09-26 DIAGNOSIS — E039 Hypothyroidism, unspecified: Secondary | ICD-10-CM | POA: Diagnosis present

## 2023-09-26 DIAGNOSIS — B961 Klebsiella pneumoniae [K. pneumoniae] as the cause of diseases classified elsewhere: Secondary | ICD-10-CM | POA: Diagnosis present

## 2023-09-26 DIAGNOSIS — C679 Malignant neoplasm of bladder, unspecified: Secondary | ICD-10-CM | POA: Diagnosis present

## 2023-09-26 DIAGNOSIS — E875 Hyperkalemia: Secondary | ICD-10-CM | POA: Diagnosis present

## 2023-09-26 DIAGNOSIS — B9689 Other specified bacterial agents as the cause of diseases classified elsewhere: Secondary | ICD-10-CM | POA: Diagnosis not present

## 2023-09-26 DIAGNOSIS — Z87891 Personal history of nicotine dependence: Secondary | ICD-10-CM | POA: Diagnosis not present

## 2023-09-26 DIAGNOSIS — Z1624 Resistance to multiple antibiotics: Secondary | ICD-10-CM | POA: Diagnosis present

## 2023-09-26 DIAGNOSIS — B9620 Unspecified Escherichia coli [E. coli] as the cause of diseases classified elsewhere: Secondary | ICD-10-CM | POA: Diagnosis present

## 2023-09-26 DIAGNOSIS — Z90710 Acquired absence of both cervix and uterus: Secondary | ICD-10-CM | POA: Diagnosis not present

## 2023-09-26 DIAGNOSIS — Z8249 Family history of ischemic heart disease and other diseases of the circulatory system: Secondary | ICD-10-CM | POA: Diagnosis not present

## 2023-09-26 DIAGNOSIS — E10649 Type 1 diabetes mellitus with hypoglycemia without coma: Secondary | ICD-10-CM | POA: Diagnosis not present

## 2023-09-26 DIAGNOSIS — Z79899 Other long term (current) drug therapy: Secondary | ICD-10-CM | POA: Diagnosis not present

## 2023-09-26 DIAGNOSIS — E1065 Type 1 diabetes mellitus with hyperglycemia: Secondary | ICD-10-CM | POA: Diagnosis present

## 2023-09-26 DIAGNOSIS — E871 Hypo-osmolality and hyponatremia: Secondary | ICD-10-CM | POA: Diagnosis present

## 2023-09-26 DIAGNOSIS — N390 Urinary tract infection, site not specified: Secondary | ICD-10-CM | POA: Diagnosis present

## 2023-09-26 DIAGNOSIS — R81 Glycosuria: Secondary | ICD-10-CM | POA: Diagnosis present

## 2023-09-26 DIAGNOSIS — I10 Essential (primary) hypertension: Secondary | ICD-10-CM | POA: Diagnosis present

## 2023-09-26 LAB — BASIC METABOLIC PANEL
Anion gap: 8 (ref 5–15)
BUN: 19 mg/dL (ref 8–23)
CO2: 22 mmol/L (ref 22–32)
Calcium: 8.5 mg/dL — ABNORMAL LOW (ref 8.9–10.3)
Chloride: 102 mmol/L (ref 98–111)
Creatinine, Ser: 0.56 mg/dL (ref 0.44–1.00)
GFR, Estimated: >60 mL/min (ref >60)
Glucose, Bld: 63 mg/dL — ABNORMAL LOW (ref 70–99)
Potassium: 4.2 mmol/L (ref 3.5–5.1)
Sodium: 132 mmol/L — ABNORMAL LOW (ref 135–145)

## 2023-09-26 LAB — CBC
HCT: 37.2 % (ref 36.0–46.0)
Hemoglobin: 11.8 g/dL — ABNORMAL LOW (ref 12.0–15.0)
MCH: 29.1 pg (ref 26.0–34.0)
MCHC: 31.7 g/dL (ref 30.0–36.0)
MCV: 91.6 fL (ref 80.0–100.0)
Platelets: 213 10*3/uL (ref 150–400)
RBC: 4.06 MIL/uL (ref 3.87–5.11)
RDW: 14.6 % (ref 11.5–15.5)
WBC: 6.8 10*3/uL (ref 4.0–10.5)
nRBC: 0 % (ref 0.0–0.2)

## 2023-09-26 LAB — GLUCOSE, CAPILLARY
Glucose-Capillary: 58 mg/dL — ABNORMAL LOW (ref 70–99)
Glucose-Capillary: 64 mg/dL — ABNORMAL LOW (ref 70–99)
Glucose-Capillary: 95 mg/dL (ref 70–99)
Glucose-Capillary: 289 mg/dL — ABNORMAL HIGH (ref 70–99)
Glucose-Capillary: 273 mg/dL — ABNORMAL HIGH (ref 70–99)
Glucose-Capillary: 159 mg/dL — ABNORMAL HIGH (ref 70–99)
Glucose-Capillary: 232 mg/dL — ABNORMAL HIGH (ref 70–99)

## 2023-09-26 LAB — URINE CULTURE: Culture: NO GROWTH

## 2023-09-26 LAB — MAGNESIUM: Magnesium: 2.2 mg/dL (ref 1.7–2.4)

## 2023-09-26 NOTE — Care Management Obs Status (Addendum)
MEDICARE OBSERVATION STATUS NOTIFICATION   Patient Details  Name: Zaryia Petithomme MRN: [id number] Date of Birth: 05-18-1935   Medicare Observation Status Notification Given:  Yes   Beckie Busing, RN 09/26/2023, 1:34 PM

## 2023-09-26 NOTE — Plan of Care (Signed)

## 2023-09-26 NOTE — Progress Notes (Signed)
Mobility Specialist - Progress Note   09/26/23 1100  Mobility  Activity Ambulated with assistance in hallway  Level of Assistance Standby assist, set-up cues, supervision of patient - no hands on  Assistive Device Four wheel walker  Distance Ambulated (ft) 500 ft  Range of Motion/Exercises Active  Activity Response Tolerated well  Mobility Referral Yes  Mobility visit 1 Mobility  Mobility Specialist Start Time (ACUTE ONLY) 1007  Mobility Specialist Stop Time (ACUTE ONLY) 1029  Mobility Specialist Time Calculation (min) (ACUTE ONLY) 22 min   Received in chair and agreed to mobility. Had no issues throughout session. Returned to chair with all needs met. Alarm on.  Marilynne Halsted Mobility Specialist

## 2023-09-26 NOTE — Inpatient Diabetes Management (Signed)
Inpatient Diabetes Program Recommendations  AACE/ADA: New Consensus Statement on Inpatient Glycemic Control (2015)  Target Ranges:  Prepandial:   less than 140 mg/dL      Peak postprandial:   less than 180 mg/dL (1-2 hours)      Critically ill patients:  140 - 180 mg/dL   Lab Results  Component Value Date   GLUCAP 289 (H) 09/26/2023   HGBA1C 10.1 (H) 08/10/2023    Review of Glycemic Control  Latest Reference Range & Units 09/25/23 21:57 09/26/23 05:14 09/26/23 05:42 09/26/23 06:12 09/26/23 08:04  Glucose-Capillary 70 - 99 mg/dL 161 (H) 58 (L) 64 (L) 95 289 (H)  (H): Data is abnormally high (L): Data is abnormally low  Diabetes history: LADA/DM1 Outpatient Diabetes medications: Toujeo 12 units every day, Novolog 11 units TID if > 220 mg/dL Current orders for Inpatient glycemic control: Semglee 12 units every day, Novolog 0-15 units TID and 0-5 units QHS  Inpatient Diabetes Program Recommendations:    Hypoglycemia this morning-58 mg/dL.  Please decrease basal:  Semglee 8 units at bedtime.  Will continue to follow while inpatient.  Thank you, Dulce Sellar, MSN, CDCES Diabetes Coordinator Inpatient Diabetes Program [phone] (team pager from 8a-5p)

## 2023-09-26 NOTE — Progress Notes (Signed)
Mobility Specialist - Progress Note   09/26/23 1440  Mobility  Activity Ambulated with assistance in hallway  Level of Assistance Standby assist, set-up cues, supervision of patient - no hands on  Assistive Device Four wheel walker  Distance Ambulated (ft) 500 ft  Range of Motion/Exercises Active  Activity Response Tolerated well  Mobility Referral Yes  Mobility visit 1 Mobility  Mobility Specialist Start Time (ACUTE ONLY) 1410  Mobility Specialist Stop Time (ACUTE ONLY) 1423  Mobility Specialist Time Calculation (min) (ACUTE ONLY) 13 min   Received in chair and agreed to mobility. Had no issues throughout session. Returned to chair with all needs met.  Marilynne Halsted Mobility Specialist

## 2023-09-26 NOTE — Progress Notes (Signed)
[facility]  PROGRESS NOTE  [name] YQM:[id number] DOB: [birth year] DOA: 09/24/2023 PCP: Karen Brunette, MD   Brief HPI:   88 y.o. female with medical history significant of hypertension, diabetes mellitus, hyperlipidemia, atrial fibrillation on Eliquis, hypothyroidism and chronic cystitis on daily antibiotic presented to hospital with urinary frequency urgency and intermittent hematuria.  Of note she was recently seen by urology as outpatient and had a biopsy of the bladder done on 09/12/2023.  She had been having intermittent hematuria since then.  In the ED she also complained of generalized weakness and UA and urine culture were collected.  Patient was then discharged home with Keflex but since urine culture came out to be positive for  ESBL Klebsiella pneumonia only susceptible to Avycaz, imipenem, meropenem and Vabomere, she was advised to come to the ED for IV meropenem.  In the ED vitals were stable.  Patient had mild hyponatremia at 133.  WBC at 6.3.  Urinalysis showed significant glycosuria negative nitrate.  EKG showed normal sinus rhythm.  Patient was then considered for admission to hospital for further evaluation and treatment.        Assessment/Plan:   Symptomatic UTI with ESBL Klebsiella pneumonia Patient with chronic cystitis on daily trimethoprim followed by urology found to have a recent urine culture with ESBL Klebsiella pneumonia that is only sensitive to a few antibiotics including meropenem.  Urine culture was done 09/16/2023.  Was on outpatient Keflex which has been discontinued.  Continue IV meropenem.   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 Being managed as type 1 diabetes care [facility].  Patient is on Toujeo 12 units at bedtime and NovoLog 11 units with meals as well as sliding scale.   -Blood glucose has been labile   Bladder cancer.  Seen by urology Dr. Alvester Nichols  as outpatient on 09/12/2023 and underwent cystoscopy with bladder biopsy and fulguration of medium bladder tumor with bilateral retrograde pyelogram.  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  Will need to follow-up with urology as outpatient.   # Chronic hyponatremia Baseline sodium level between 1 30-1 33.  Will continue to monitor.   Hyperkalemia Improved.  Latest potassium of 4.4.   Paroxysmal atrial fibrillation. Rate controlled on amiodarone, diltiazem and Eliquis   Hypothyroidism -Continue Synthroid.  TSH at 11.8.  Will need outpatient monitoring for acute illnesses improved.   Generalized weakness Fatigue and weakness over the last few weeks.  TSH is slightly elevated.  Add T3-T4, Magnesium phosphorus vitamin D within normal limit.  PT OT evaluation and treatment.   BLE edema On Lasix and potassium as outpatient.  Lasix currently on hold.    Medications     amiodarone  100 mg Oral Daily   apixaban  2.5 mg Oral BID   diltiazem  180 mg Oral Daily   insulin aspart  0-15 Units Subcutaneous TID WC   insulin aspart  0-5 Units Subcutaneous QHS   insulin glargine-yfgn  12 Units Subcutaneous QHS   levothyroxine  100 mcg Oral Q0600     Data Reviewed:   CBG:  Recent Labs  Lab 09/25/23 2157 09/26/23 0514 09/26/23 0542 09/26/23 0612 09/26/23 0804  GLUCAP 198* 58* 64* 95 289*    SpO2: 100 %    Vitals:   09/25/23 0959 09/25/23 1408 09/25/23 1932 09/26/23 0645  BP: 119/60 (!) 106/49 (!) 146/57 [name])  108/56  Pulse: 66 65 73 66  Resp:  18 18 18   Temp:  98.9 F (37.2 C) 98.3 F (36.8 C) 97.7 F (36.5 C)  TempSrc:  Oral Oral Oral  SpO2:  98% 97% 100%  Weight:      Height:          Data Reviewed:  Basic Metabolic Panel: Recent Labs  Lab 09/24/23 1435 09/24/23 1929 09/25/23 0625 09/26/23 0541  NA 128*  --  128* 132*  K 5.6*  --  4.5 4.2  CL 96*  --  98 102  CO2 24  --  25 22  GLUCOSE 406*  --   168* 63*  BUN 23  --  20 19  CREATININE 1.11*  --  0.67 0.56  CALCIUM 8.4*  --  8.1* 8.5*  MG  --  2.4  --  2.2  PHOS  --  2.6  --   --     CBC: Recent Labs  Lab 09/24/23 1435 09/25/23 0625 09/26/23 0541  WBC 6.3 5.4 6.8  NEUTROABS 4.8  --   --   HGB 11.2* 9.5* 11.8*  HCT 35.1* 29.5* 37.2  MCV 89.5 89.1 91.6  PLT 200 196 213    LFT Recent Labs  Lab 09/25/23 0625  AST 28  ALT 26  ALKPHOS 71  BILITOT 0.4  PROT 5.2*  ALBUMIN 2.4*     Antibiotics: Anti-infectives (From admission, onward)    Start     Dose/Rate Route Frequency Ordered Stop   09/25/23 1200  meropenem (MERREM) 1 g in sodium chloride 0.9 % 100 mL IVPB        1 g 200 mL/hr over 30 Minutes Intravenous Every 12 hours 09/25/23 0943     09/24/23 1400  meropenem (MERREM) 500 mg in sodium chloride 0.9 % 100 mL IVPB  Status:  Discontinued        500 mg 200 mL/hr over 30 Minutes Intravenous Every 12 hours 09/24/23 1336 09/25/23 0943        DVT prophylaxis: Eliquis  Code Status: Full code  Family Communication: No family at bedside   CONSULTS    Subjective   Denies any complaints   Objective    Physical Examination:   General-appears in no acute distress Heart-S1-S2, regular, no murmur auscultated Lungs-clear to auscultation bilaterally, no wheezing or crackles auscultated Abdomen-soft, nontender, no organomegaly Extremities-no edema in the lower extremities Neuro-alert, oriented x3, no focal deficit noted  Status is: Inpatient:             [name]   [facility] If 7PM-7AM, please contact night-coverage at [email or web address], Office  [phone]   09/26/2023, 11:20 AM  LOS: 0 days

## 2023-09-26 NOTE — Progress Notes (Signed)
Hypoglycemic Event  CBG: 58  Treatment: 8 oz juice/soda  Symptoms: None  Follow-up CBG: Time:0615 CBG Result:94  Possible Reasons for Event: Medication regimen:    Comments/MD notified:    Elly Modena

## 2023-09-26 NOTE — Plan of Care (Signed)
  Problem: Education: Goal: Ability to describe self-care measures that may prevent or decrease complications (Diabetes Survival Skills Education) will improve Outcome: Progressing Goal: Individualized Educational Video(s) Outcome: Progressing   

## 2023-09-27 DIAGNOSIS — B9689 Other specified bacterial agents as the cause of diseases classified elsewhere: Secondary | ICD-10-CM | POA: Diagnosis not present

## 2023-09-27 DIAGNOSIS — N390 Urinary tract infection, site not specified: Secondary | ICD-10-CM | POA: Diagnosis not present

## 2023-09-27 DIAGNOSIS — Z1612 Extended spectrum beta lactamase (ESBL) resistance: Secondary | ICD-10-CM | POA: Diagnosis not present

## 2023-09-27 DIAGNOSIS — B9629 Other Escherichia coli [E. coli] as the cause of diseases classified elsewhere: Secondary | ICD-10-CM | POA: Diagnosis not present

## 2023-09-27 LAB — GLUCOSE, CAPILLARY
Glucose-Capillary: 208 mg/dL — ABNORMAL HIGH (ref 70–99)
Glucose-Capillary: 280 mg/dL — ABNORMAL HIGH (ref 70–99)

## 2023-09-27 LAB — COMPREHENSIVE METABOLIC PANEL
ALT: 30 U/L (ref 0–44)
AST: 40 U/L (ref 15–41)
Albumin: 2.2 g/dL — ABNORMAL LOW (ref 3.5–5.0)
Alkaline Phosphatase: 72 U/L (ref 38–126)
Anion gap: 7 (ref 5–15)
BUN: 29 mg/dL — ABNORMAL HIGH (ref 8–23)
CO2: 24 mmol/L (ref 22–32)
Calcium: 8.1 mg/dL — ABNORMAL LOW (ref 8.9–10.3)
Chloride: 97 mmol/L — ABNORMAL LOW (ref 98–111)
Creatinine, Ser: 0.83 mg/dL (ref 0.44–1.00)
GFR, Estimated: >60 mL/min (ref >60)
Glucose, Bld: 269 mg/dL — ABNORMAL HIGH (ref 70–99)
Potassium: 4.3 mmol/L (ref 3.5–5.1)
Sodium: 128 mmol/L — ABNORMAL LOW (ref 135–145)
Total Bilirubin: 0.3 mg/dL (ref <1.2)
Total Protein: 4.8 g/dL — ABNORMAL LOW (ref 6.5–8.1)

## 2023-09-27 NOTE — Discharge Summary (Signed)
Physician Discharge Summary   Patient: [name] MRN: [id number] DOB: June 21, 1935  Admit date:     09/24/2023  Discharge date: 09/27/23  Discharge Physician: [name]   PCP: [name], MD   Recommendations at discharge:   Follow-up PCP in 2 weeks Check TSH in 2 weeks as outpatient  Discharge Diagnoses: Principal Problem:   Urinary tract infection due to ESBL Klebsiella Active Problems:   UTI (urinary tract infection)  Resolved Problems:   * No resolved hospital problems. *   88 y.o. female with medical history significant of hypertension, diabetes mellitus, hyperlipidemia, atrial fibrillation on Eliquis, hypothyroidism and chronic cystitis on daily antibiotic presented to hospital with urinary frequency urgency and intermittent hematuria.  Of note she was recently seen by urology as outpatient and had a biopsy of the bladder done on 09/12/2023.  She had been having intermittent hematuria since then.  In the ED she also complained of generalized weakness and UA and urine culture were collected.  Patient was then discharged home with Keflex but since urine culture came out to be positive for  ESBL Klebsiella pneumonia only susceptible to Avycaz, imipenem, meropenem and Vabomere, she was advised to come to the ED for IV meropenem.  In the ED vitals were stable.  Patient had mild hyponatremia at 133.  WBC at 6.3.  Urinalysis showed significant glycosuria negative nitrate.  EKG showed normal sinus rhythm.  Patient was then considered for admission to hospital for further evaluation and treatment.          Assessment/Plan:    Symptomatic UTI with ESBL Klebsiella pneumonia Patient with chronic cystitis on daily trimethoprim followed by urology found to have a recent urine culture with ESBL Klebsiella pneumonia that is only sensitive to a few antibiotics including meropenem.  Urine culture was done 09/16/2023.  Was on outpatient Keflex which has been discontinued.  Started on IV  meropenem.  -Repeat urine culture showed no growth.  -Will discontinue meropenem.  Discussed with ID.   -No further treatment required. -Continue prophylactic dose of trimethoprim which she was taking at home.   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 Continue home medications   Bladder cancer.  Seen by urology Dr. Alvester Morin as outpatient on 09/12/2023 and underwent cystoscopy with bladder biopsy and fulguration of medium bladder tumor with bilateral retrograde pyelogram.  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  Will need to follow-up with urology as outpatient.   # Chronic hyponatremia -Sodium has been chronically low for past 6 months. -Today sodium is 128. -Follow-up with PCP as outpatient.   Hyperkalemia -Resolved.  Will discontinue potassium supplementation at home.   Paroxysmal atrial fibrillation. Rate controlled on amiodarone, diltiazem and Eliquis   Hypothyroidism -Continue Synthroid.  TSH at 11.8.  Will need outpatient monitoring for acute illnesses improved.   Generalized weakness Fatigue and weakness over the last few weeks.  TSH is slightly elevated.  Add T3-T4, Home health PT ordered   BLE edema On Lasix as needed          Consultants:  Procedures performed:  Disposition: Home Diet recommendation:  Discharge Diet Orders (From admission, onward)     Start     Ordered   09/27/23 0000  Diet - low sodium heart healthy        09/27/23 1323           Regular diet DISCHARGE MEDICATION: Allergies as of 09/27/2023   No Known  Allergies      Medication List     STOP taking these medications    fosfomycin 3 g Pack Commonly known as: MONUROL   Klor-Con M20 20 MEQ tablet Generic drug: potassium chloride SA   nitrofurantoin (macrocrystal-monohydrate) 100 MG capsule Commonly known as: MACROBID       TAKE these medications    acetaminophen 500 MG tablet Commonly known as:  TYLENOL Take 500 mg by mouth every 6 (six) hours as needed for moderate pain (pain score 4-6).   amiodarone 100 MG tablet Commonly known as: PACERONE Take 1 tablet (100 mg total) by mouth daily. What changed: Another medication with the same name was removed. Continue taking this medication, and follow the directions you see here.   Eliquis 2.5 MG Tabs tablet Generic drug: apixaban TAKE 1 TABLET BY MOUTH TWICE A DAY   FIBER-CAPS PO Take 3-5 capsules by mouth as needed (constipation).   furosemide 20 MG tablet Commonly known as: LASIX Take 1 tablet (20 mg total) by mouth daily as needed for fluid or edema.   levothyroxine 112 MCG tablet Commonly known as: SYNTHROID Take 112 mcg by mouth daily before breakfast. What changed: Another medication with the same name was removed. Continue taking this medication, and follow the directions you see here.   mirtazapine 30 MG tablet Commonly known as: REMERON Take 30 mg by mouth at bedtime as needed (sleep).   NovoLOG FlexPen 100 UNIT/ML FlexPen Generic drug: insulin aspart Inject 8 Units into the skin 3 (three) times daily with meals. What changed:  how much to take when to take this additional instructions   pantoprazole 40 MG tablet Commonly known as: PROTONIX Take 1 tablet (40 mg total) by mouth daily at 6 (six) AM.   Tiadylt ER 180 MG 24 hr capsule Generic drug: diltiazem TAKE 1 CAPSULE BY MOUTH EVERY DAY   Toujeo Max SoloStar 300 UNIT/ML Solostar Pen Generic drug: insulin glargine (2 Unit Dial) Inject 14 Units into the skin at bedtime. What changed: how much to take   trimethoprim 100 MG tablet Commonly known as: TRIMPEX Take 100 mg by mouth daily.   vitamin C 100 MG tablet Take 100 mg by mouth daily.        Follow-up Information     [name], MD Follow up in 2 week(s).   Specialty: Internal Medicine Why: Check TSH as outpatient in 2 weeks Contact information: [address] [place] [address] [place]  [place] [address] [phone]                Discharge Exam: Ceasar Mons Weights   09/24/23 1233  Weight: 51 kg   General-appears in no acute distress Heart-S1-S2, regular, no murmur auscultated Lungs-clear to auscultation bilaterally, no wheezing or crackles auscultated Abdomen-soft, nontender, no organomegaly Extremities-no edema in the lower extremities Neuro-alert, oriented x3, no focal deficit noted  Condition at discharge: good  The results of significant diagnostics from this hospitalization (including imaging, microbiology, ancillary and laboratory) are listed below for reference.   Imaging Studies: CT Renal Stone Study Result Date: 09/16/2023 CLINICAL DATA:  Bladder biopsy with weakness and vomiting EXAM: CT ABDOMEN AND PELVIS WITHOUT CONTRAST TECHNIQUE: Multidetector CT imaging of the abdomen and pelvis was performed following the standard protocol without IV contrast. RADIATION DOSE REDUCTION: This exam was performed according to the departmental dose-optimization program which includes automated exposure control, adjustment of the mA and/or kV according to patient size and/or use of iterative reconstruction technique. COMPARISON:  CT 07/13/2023 FINDINGS: Lower  chest: Lung bases demonstrate no acute airspace disease. Mild cardiomegaly. Hepatobiliary: Cholecystectomy. Prominent common bile duct presumably due to postsurgical change. Pancreas: Atrophic.  No inflammation Spleen: Normal in size without focal abnormality. Adrenals/Urinary Tract: Adrenal glands are normal. Kidneys show no hydronephrosis. Diffuse bladder wall thickening. Moderate air in the urinary bladder Stomach/Bowel: Stomach is within normal limits. No evidence of bowel wall thickening, distention, or inflammatory changes. Vascular/Lymphatic: Moderate aortic atherosclerosis. No aneurysm. No suspicious lymph nodes Reproductive: Status post hysterectomy. No adnexal masses. Other: Negative for free air or free fluid.  Postoperative changes along the anterior pelvic wall. Musculoskeletal: No acute or suspicious osseous abnormality IMPRESSION: 1. Diffuse bladder wall thickening. Moderate air in the urinary bladder, correlate for any recent instrumentation. 2. Otherwise no CT evidence for acute intra-abdominal or pelvic abnormality. 3. Aortic atherosclerosis. Aortic Atherosclerosis (ICD10-I70.0). Electronically Signed   By: Jasmine Pang M.D.   On: 09/16/2023 19:43   DG Chest Portable 1 View Result Date: 09/16/2023 CLINICAL DATA:  Weakness, confusion EXAM: PORTABLE CHEST 1 VIEW COMPARISON:  07/31/2023 FINDINGS: Single frontal view of the chest demonstrates a stable cardiac silhouette. No airspace disease, effusion, or pneumothorax. Chronic scarring unchanged. No acute bony abnormalities. IMPRESSION: 1. Stable chest, no acute process. Electronically Signed   By: Sharlet Salina M.D.   On: 09/16/2023 17:53   DG C-Arm 1-60 Min-No Report Result Date: 09/12/2023 Fluoroscopy was utilized by the requesting physician.  No radiographic interpretation.    Microbiology: Results for orders placed or performed during the hospital encounter of 09/24/23  Blood culture (routine x 2)     Status: None (Preliminary result)   Collection Time: 09/24/23  2:35 PM   Specimen: BLOOD  Result Value Ref Range Status   Specimen Description   Final    BLOOD RIGHT ANTECUBITAL Performed at [facility], [address] [place], [place] [address]    Special Requests   Final    BOTTLES DRAWN AEROBIC AND ANAEROBIC Blood Culture results may not be optimal due to an inadequate volume of blood received in culture bottles Performed at [facility], [address], [place], [place] [address]    Culture   Final    NO GROWTH 3 DAYS Performed at [facility], [address] [place], [place] [address]    Report Status PENDING  Incomplete  Blood culture (routine x 2)     Status: None (Preliminary result)    Collection Time: 09/24/23  2:35 PM   Specimen: BLOOD  Result Value Ref Range Status   Specimen Description   Final    BLOOD LEFT ANTECUBITAL Performed at [facility], [address], [place], [place] [address]    Special Requests   Final    BOTTLES DRAWN AEROBIC AND ANAEROBIC Blood Culture adequate volume Performed at [facility], [address], [place], [place] [address]    Culture   Final    NO GROWTH 3 DAYS Performed at [facility], [address] [place], [place] [address]    Report Status PENDING  Incomplete  Urine Culture     Status: None   Collection Time: 09/24/23  2:36 PM   Specimen: Urine, Clean Catch  Result Value Ref Range Status   Specimen Description   Final    URINE, CLEAN CATCH Performed at [facility], [address] [place], [place] [address]    Special Requests   Final    NONE Performed at [facility], [address], [place], [place] [address]    Culture   Final    NO GROWTH Performed at [facility], [address], [place], [place] [address]    Report Status 09/26/2023 FINAL  Final    Labs: CBC: Recent Labs  Lab 09/24/23 1435 09/25/23 0625 09/26/23 0541  WBC 6.3 5.4 6.8  NEUTROABS 4.8  --   --   HGB 11.2* 9.5* 11.8*  HCT 35.1* 29.5* 37.2  MCV 89.5 89.1 91.6  PLT 200 196 213   Basic Metabolic Panel: Recent Labs  Lab 09/24/23 1435 09/24/23 1929 09/25/23 0625 09/26/23 0541 09/27/23 0602  NA 128*  --  128* 132* 128*  K 5.6*  --  4.5 4.2 4.3  CL 96*  --  98 102 97*  CO2 24  --  25 22 24   GLUCOSE 406*  --  168* 63* 269*  BUN 23  --  20 19 29*  CREATININE 1.11*  --  0.67 0.56 0.83  CALCIUM 8.4*  --  8.1* 8.5* 8.1*  MG  --  2.4  --  2.2  --   PHOS  --  2.6  --   --   --    Liver Function Tests: Recent Labs  Lab 09/25/23 0625 09/27/23 0602  AST 28 40  ALT 26 30  ALKPHOS 71 72  BILITOT 0.4 0.3  PROT 5.2* 4.8*  ALBUMIN 2.4* 2.2*    CBG: Recent Labs  Lab 09/26/23 1212 09/26/23 1546 09/26/23 2108 09/27/23 0735 09/27/23 1136  GLUCAP 273* 159* 232* 208* 280*    Discharge time spent: greater than 30 minutes.  Signed: [name], MD [facility] 09/27/2023

## 2023-09-27 NOTE — Progress Notes (Signed)
Physical Therapy Treatment Patient Details Name: [name] MRN: [id number] DOB: 1935-04-03 Today's Date: 09/27/2023   History of Present Illness [name] is a 88 yr old female brought to the hospital with ongoing urinary urgency, intermittent hematuria, and after her urine cultures were noted to have ESBL Klebsiella pneumoniae. PMH: HTN, LADA, a fib, chronic cystitis    PT Comments   Pt admitted with above diagnosis.  Pt currently with functional limitations due to the deficits listed below (see PT Problem List). Pt seated EOB when PT arrived. Pt agreeable to therapy intervention. Pt reports she is going home this afternoon. Pt reports no pain, SOB or dizziness. Pt states she wants to get as strong as possible. Pt is mod I for bed mobility, S for transfer tasks at rollator level with good recall for brake management, S for gait tasks in hallway with min cues for posture for 500 feet. Pt elected to return to EOB and all needs in place. Pt will benefit from acute skilled PT to increase their independence and safety with mobility to allow discharge.      If plan is discharge home, recommend the following: A little help with walking and/or transfers;A little help with bathing/dressing/bathroom;Assistance with cooking/housework;Assist for transportation;Help with stairs or ramp for entrance   Can travel by private vehicle        Equipment Recommendations  None recommended by PT    Recommendations for Other Services       Precautions / Restrictions Precautions Precautions: Fall Restrictions Weight Bearing Restrictions Per Provider Order: No     Mobility  Bed Mobility Overal bed mobility: Needs Assistance       Supine to sit: Modified independent (Device/Increase time)     General bed mobility comments: pt seated EOB when PT arrived    Transfers Overall transfer level: Needs assistance Equipment used: Rollator (4 wheels) Transfers: Sit to/from Stand Sit to Stand:  Supervision           General transfer comment: pt demonstrated good recall for rollator brake management with min cues    Ambulation/Gait Ambulation/Gait assistance: Supervision Gait Distance (Feet): 500 Feet   Gait Pattern/deviations: Step-through pattern Gait velocity: decr     General Gait Details: min cues for posture and safety   Stairs             Wheelchair Mobility     Tilt Bed    Modified Rankin (Stroke Patients Only)       Balance Overall balance assessment: Needs assistance Sitting-balance support: No upper extremity supported, Feet supported Sitting balance-Leahy Scale: Good     Standing balance support: No upper extremity supported Standing balance-Leahy Scale: Fair Standing balance comment: static standing no UE support                            Cognition Arousal: Alert Behavior During Therapy: WFL for tasks assessed/performed Overall Cognitive Status: Within Functional Limits for tasks assessed                                          Exercises      General Comments        Pertinent Vitals/Pain Pain Assessment Pain Assessment: No/denies pain    Home Living  Prior Function            PT Goals (current goals can now be found in the care plan section) Acute Rehab PT Goals Patient Stated Goal: Regain IND PT Goal Formulation: With patient Time For Goal Achievement: 10/02/23 Potential to Achieve Goals: Good Progress towards PT goals: Progressing toward goals    Frequency    Min 1X/week      PT Plan      Co-evaluation              AM-PAC PT "6 Clicks" Mobility   Outcome Measure  Help needed turning from your back to your side while in a flat bed without using bedrails?: None Help needed moving from lying on your back to sitting on the side of a flat bed without using bedrails?: None Help needed moving to and from a bed to a chair (including a  wheelchair)?: A Little Help needed standing up from a chair using your arms (e.g., wheelchair or bedside chair)?: A Little Help needed to walk in hospital room?: A Little Help needed climbing 3-5 steps with a railing? : A Lot 6 Click Score: 19    End of Session Equipment Utilized During Treatment: Gait belt Activity Tolerance: Patient tolerated treatment well Patient left: with call bell/phone within reach (EOB per pt preferance) Nurse Communication: Mobility status PT Visit Diagnosis: Unsteadiness on feet (R26.81);Difficulty in walking, not elsewhere classified (R26.2)     Time: 2951-8841 PT Time Calculation (min) (ACUTE ONLY): 17 min  Charges:    $Gait Training: 8-22 mins PT General Charges $$ ACUTE PT VISIT: 1 Visit                     Johnny Bridge, PT Acute Rehab    Jacqualyn Posey 09/27/2023, 3:09 PM

## 2023-09-27 NOTE — Progress Notes (Signed)
[name] to be D/C'd home per MD order. Discussed with the patient and all questions fully answered.  Skin clean, dry and intact without evidence of skin break down, no evidence of skin tears noted.  IV catheter discontinued intact. Site without signs and symptoms of complications. Dressing and pressure applied.  An After Visit Summary was printed and given to the patient.  Patient escorted via WC, and D/C home via private auto.  Jon Gills  09/27/2023

## 2023-09-27 NOTE — TOC Transition Note (Signed)
Transition of Care [facility]) - Discharge Note   Patient Details  Name: [name] MRN: [id number] Date of Birth: 1934/11/05  Transition [facility]) CM/SW Contact:  Beckie Busing, RN Phone Number:[phone]  09/27/2023, 2:27 PM   Clinical Narrative:    Patient with discharge orders . TOC consulted for Home health services. CM at bedside to offer patient choice. Patient states that MD has already explained that she needs therapy and she is agreeable and does not have a preference to agency. Home health referral has been accepted by [facility] with Frances Furbish. AVS has been updated. No other toc noted. TOC will sign off   Final next level of care: Home w Home Health Services Barriers to Discharge: No Barriers Identified   Patient Goals and CMS Choice   CMS Medicare.gov Compare Post Acute Care list provided to:: Patient Choice offered to / list presented to : Patient [employer] ownership interest in [facility].provided to:: Patient    Discharge Placement                       Discharge Plan and Services Additional resources added to the After Visit Summary for                  DME Arranged: N/A DME Agency: NA         HH Agency: [facility] Date [facility] Agency Contacted: 09/27/23 Time HH Agency Contacted: 1424 Representative spoke with at [facility] Agency: Kandee Keen  Social Drivers of Health (SDOH) Interventions SDOH Screenings   Food Insecurity: No Food Insecurity (09/24/2023)  Housing: Low Risk  (09/24/2023)  Transportation Needs: No Transportation Needs (09/24/2023)  Utilities: Not At Risk (09/24/2023)  Depression (PHQ2-9): Low Risk  (11/30/2022)  Financial Resource Strain: Medium Risk (06/01/2023)  Tobacco Use: Medium Risk (09/24/2023)  Health Literacy: Adequate Health Literacy (08/11/2023)     Readmission Risk Interventions    08/03/2023    2:12 PM 08/02/2023    5:09 PM 01/27/2022   12:37 PM  Readmission Risk Prevention Plan  Transportation Screening  Complete Complete Complete  PCP or Specialist Appt within 3-5 Days   Complete  HRI or Home Care Consult   Complete  Social Work Consult for Recovery Care Planning/Counseling   Complete  Palliative Care Screening   Not Applicable  Medication Review Oceanographer) Complete Referral to Pharmacy Complete  PCP or Specialist appointment within 3-5 days of discharge Complete Complete   HRI or Home Care Consult Complete Complete   SW Recovery Care/Counseling Consult Complete Complete   Palliative Care Screening Not Applicable Not Applicable   Skilled Nursing Facility Not Applicable Not Applicable

## 2023-09-27 NOTE — Plan of Care (Signed)

## 2023-09-27 NOTE — Inpatient Diabetes Management (Addendum)
Inpatient Diabetes Program Recommendations  AACE/ADA: New Consensus Statement on Inpatient Glycemic Control (2015)  Target Ranges:  Prepandial:   less than 140 mg/dL      Peak postprandial:   less than 180 mg/dL (1-2 hours)      Critically ill patients:  140 - 180 mg/dL    Latest Reference Range & Units 09/26/23 08:04 09/26/23 12:12 09/26/23 15:46 09/26/23 21:08  Glucose-Capillary 70 - 99 mg/dL 161 (H)  8 units Novolog  273 (H)  8 units Novolog  159 (H)  3 units Novolog  232 (H)  2 units Novolog  12 units Semglee  (H): Data is abnormally high  Latest Reference Range & Units 09/27/23 07:35  Glucose-Capillary 70 - 99 mg/dL 096 (H)  (H): Data is abnormally high      History: LADA--Managed as Type 1 diabetes   Home DM Meds: Trulicity 0.75 mg Qweek (NOT taking--Stopped at ENDO office)                              Novolog 2-8 units TID                              Toujeo 12 units QHS   Current Orders: Semglee 12 units at bedtime      Novolog Moderate Correction Scale/ SSI (0-15 units) TID AC + HS      MD- Please consider:  1. Increase Semglee slightly to 14 units at bedtime  2. Reduce the Novolog SSI to the 0-9 unit Sensitive scale  3. Start Novolog Meal Coverage: Novolog 6 units TID with meals (~50% home dose) HOLD if pt NPO HOLD if pt eats <50% meals      ENDO: Dr. Ledon Snare with [facility] Last Seen 09/07/2023 Pt was told to take the following: - Take Toujeo 12 units daily/ 14 units PM - Take Novolog 14 units TIDAC -Take Novolog per the following SSI: Pre-meal blood glucose less than 150: No correctional insulin Pre-meal blood glucose 150-200: 1 units Pre-meal blood glucose 201-250: 2 units Pre-meal blood glucose 251-300: 4 units Pre-meal blood glucose 300-350: 4 units Pre-meal blood glucose 351-400: 6 units Pre-meal blood glucose greater than 400: 6 units and recheck blood glucose again in two hours. If blood glucose is not lower, then please contact your  endocrinologist's office for further instructions.     --Will follow patient during hospitalization--  Ambrose Finland RN, MSN, CDCES Diabetes Coordinator Inpatient Glycemic Control Team Team Pager: [phone] (8a-5p)

## 2023-09-29 DIAGNOSIS — B961 Klebsiella pneumoniae [K. pneumoniae] as the cause of diseases classified elsewhere: Secondary | ICD-10-CM | POA: Diagnosis not present

## 2023-09-29 DIAGNOSIS — N3001 Acute cystitis with hematuria: Secondary | ICD-10-CM | POA: Diagnosis not present

## 2023-09-29 DIAGNOSIS — C678 Malignant neoplasm of overlapping sites of bladder: Secondary | ICD-10-CM | POA: Diagnosis not present

## 2023-09-29 DIAGNOSIS — Z1612 Extended spectrum beta lactamase (ESBL) resistance: Secondary | ICD-10-CM | POA: Diagnosis not present

## 2023-09-29 DIAGNOSIS — D630 Anemia in neoplastic disease: Secondary | ICD-10-CM | POA: Diagnosis not present

## 2023-09-29 DIAGNOSIS — I119 Hypertensive heart disease without heart failure: Secondary | ICD-10-CM | POA: Diagnosis not present

## 2023-09-29 DIAGNOSIS — E1022 Type 1 diabetes mellitus with diabetic chronic kidney disease: Secondary | ICD-10-CM | POA: Diagnosis not present

## 2023-09-29 DIAGNOSIS — N3021 Other chronic cystitis with hematuria: Secondary | ICD-10-CM | POA: Diagnosis not present

## 2023-09-29 DIAGNOSIS — Z483 Aftercare following surgery for neoplasm: Secondary | ICD-10-CM | POA: Diagnosis not present

## 2023-09-29 LAB — CULTURE, BLOOD (ROUTINE X 2)
Culture: NO GROWTH
Culture: NO GROWTH
Special Requests: ADEQUATE

## 2023-09-30 DIAGNOSIS — I119 Hypertensive heart disease without heart failure: Secondary | ICD-10-CM | POA: Diagnosis not present

## 2023-09-30 DIAGNOSIS — N3021 Other chronic cystitis with hematuria: Secondary | ICD-10-CM | POA: Diagnosis not present

## 2023-09-30 DIAGNOSIS — Z483 Aftercare following surgery for neoplasm: Secondary | ICD-10-CM | POA: Diagnosis not present

## 2023-09-30 DIAGNOSIS — Z1612 Extended spectrum beta lactamase (ESBL) resistance: Secondary | ICD-10-CM | POA: Diagnosis not present

## 2023-09-30 DIAGNOSIS — N3001 Acute cystitis with hematuria: Secondary | ICD-10-CM | POA: Diagnosis not present

## 2023-09-30 DIAGNOSIS — B961 Klebsiella pneumoniae [K. pneumoniae] as the cause of diseases classified elsewhere: Secondary | ICD-10-CM | POA: Diagnosis not present

## 2023-09-30 DIAGNOSIS — D630 Anemia in neoplastic disease: Secondary | ICD-10-CM | POA: Diagnosis not present

## 2023-09-30 DIAGNOSIS — E1022 Type 1 diabetes mellitus with diabetic chronic kidney disease: Secondary | ICD-10-CM | POA: Diagnosis not present

## 2023-09-30 DIAGNOSIS — C678 Malignant neoplasm of overlapping sites of bladder: Secondary | ICD-10-CM | POA: Diagnosis not present

## 2023-10-03 DIAGNOSIS — Z483 Aftercare following surgery for neoplasm: Secondary | ICD-10-CM | POA: Diagnosis not present

## 2023-10-03 DIAGNOSIS — B961 Klebsiella pneumoniae [K. pneumoniae] as the cause of diseases classified elsewhere: Secondary | ICD-10-CM | POA: Diagnosis not present

## 2023-10-03 DIAGNOSIS — N3021 Other chronic cystitis with hematuria: Secondary | ICD-10-CM | POA: Diagnosis not present

## 2023-10-03 DIAGNOSIS — I119 Hypertensive heart disease without heart failure: Secondary | ICD-10-CM | POA: Diagnosis not present

## 2023-10-03 DIAGNOSIS — D630 Anemia in neoplastic disease: Secondary | ICD-10-CM | POA: Diagnosis not present

## 2023-10-03 DIAGNOSIS — E1022 Type 1 diabetes mellitus with diabetic chronic kidney disease: Secondary | ICD-10-CM | POA: Diagnosis not present

## 2023-10-03 DIAGNOSIS — N3001 Acute cystitis with hematuria: Secondary | ICD-10-CM | POA: Diagnosis not present

## 2023-10-03 DIAGNOSIS — Z1612 Extended spectrum beta lactamase (ESBL) resistance: Secondary | ICD-10-CM | POA: Diagnosis not present

## 2023-10-03 DIAGNOSIS — C678 Malignant neoplasm of overlapping sites of bladder: Secondary | ICD-10-CM | POA: Diagnosis not present

## 2023-10-04 DIAGNOSIS — Z794 Long term (current) use of insulin: Secondary | ICD-10-CM | POA: Diagnosis not present

## 2023-10-04 DIAGNOSIS — E1065 Type 1 diabetes mellitus with hyperglycemia: Secondary | ICD-10-CM | POA: Diagnosis not present

## 2023-10-07 DIAGNOSIS — D630 Anemia in neoplastic disease: Secondary | ICD-10-CM | POA: Diagnosis not present

## 2023-10-07 DIAGNOSIS — Z483 Aftercare following surgery for neoplasm: Secondary | ICD-10-CM | POA: Diagnosis not present

## 2023-10-07 DIAGNOSIS — I119 Hypertensive heart disease without heart failure: Secondary | ICD-10-CM | POA: Diagnosis not present

## 2023-10-07 DIAGNOSIS — Z1612 Extended spectrum beta lactamase (ESBL) resistance: Secondary | ICD-10-CM | POA: Diagnosis not present

## 2023-10-07 DIAGNOSIS — B961 Klebsiella pneumoniae [K. pneumoniae] as the cause of diseases classified elsewhere: Secondary | ICD-10-CM | POA: Diagnosis not present

## 2023-10-07 DIAGNOSIS — N3001 Acute cystitis with hematuria: Secondary | ICD-10-CM | POA: Diagnosis not present

## 2023-10-07 DIAGNOSIS — E1022 Type 1 diabetes mellitus with diabetic chronic kidney disease: Secondary | ICD-10-CM | POA: Diagnosis not present

## 2023-10-07 DIAGNOSIS — N3021 Other chronic cystitis with hematuria: Secondary | ICD-10-CM | POA: Diagnosis not present

## 2023-10-07 DIAGNOSIS — C678 Malignant neoplasm of overlapping sites of bladder: Secondary | ICD-10-CM | POA: Diagnosis not present

## 2023-10-10 DIAGNOSIS — Z1612 Extended spectrum beta lactamase (ESBL) resistance: Secondary | ICD-10-CM | POA: Diagnosis not present

## 2023-10-10 DIAGNOSIS — Z483 Aftercare following surgery for neoplasm: Secondary | ICD-10-CM | POA: Diagnosis not present

## 2023-10-10 DIAGNOSIS — D630 Anemia in neoplastic disease: Secondary | ICD-10-CM | POA: Diagnosis not present

## 2023-10-10 DIAGNOSIS — C678 Malignant neoplasm of overlapping sites of bladder: Secondary | ICD-10-CM | POA: Diagnosis not present

## 2023-10-10 DIAGNOSIS — I119 Hypertensive heart disease without heart failure: Secondary | ICD-10-CM | POA: Diagnosis not present

## 2023-10-10 DIAGNOSIS — B961 Klebsiella pneumoniae [K. pneumoniae] as the cause of diseases classified elsewhere: Secondary | ICD-10-CM | POA: Diagnosis not present

## 2023-10-10 DIAGNOSIS — N3021 Other chronic cystitis with hematuria: Secondary | ICD-10-CM | POA: Diagnosis not present

## 2023-10-10 DIAGNOSIS — E1022 Type 1 diabetes mellitus with diabetic chronic kidney disease: Secondary | ICD-10-CM | POA: Diagnosis not present

## 2023-10-10 DIAGNOSIS — N3001 Acute cystitis with hematuria: Secondary | ICD-10-CM | POA: Diagnosis not present

## 2023-10-13 ENCOUNTER — Other Ambulatory Visit: Payer: Self-pay | Admitting: Student

## 2023-10-13 DIAGNOSIS — Z1612 Extended spectrum beta lactamase (ESBL) resistance: Secondary | ICD-10-CM | POA: Diagnosis not present

## 2023-10-13 DIAGNOSIS — C678 Malignant neoplasm of overlapping sites of bladder: Secondary | ICD-10-CM | POA: Diagnosis not present

## 2023-10-13 DIAGNOSIS — D630 Anemia in neoplastic disease: Secondary | ICD-10-CM | POA: Diagnosis not present

## 2023-10-13 DIAGNOSIS — E1022 Type 1 diabetes mellitus with diabetic chronic kidney disease: Secondary | ICD-10-CM | POA: Diagnosis not present

## 2023-10-13 DIAGNOSIS — I119 Hypertensive heart disease without heart failure: Secondary | ICD-10-CM | POA: Diagnosis not present

## 2023-10-13 DIAGNOSIS — N3021 Other chronic cystitis with hematuria: Secondary | ICD-10-CM | POA: Diagnosis not present

## 2023-10-13 DIAGNOSIS — B961 Klebsiella pneumoniae [K. pneumoniae] as the cause of diseases classified elsewhere: Secondary | ICD-10-CM | POA: Diagnosis not present

## 2023-10-13 DIAGNOSIS — N3001 Acute cystitis with hematuria: Secondary | ICD-10-CM | POA: Diagnosis not present

## 2023-10-13 DIAGNOSIS — Z483 Aftercare following surgery for neoplasm: Secondary | ICD-10-CM | POA: Diagnosis not present

## 2023-10-14 DIAGNOSIS — E1022 Type 1 diabetes mellitus with diabetic chronic kidney disease: Secondary | ICD-10-CM | POA: Diagnosis not present

## 2023-10-14 DIAGNOSIS — N3021 Other chronic cystitis with hematuria: Secondary | ICD-10-CM | POA: Diagnosis not present

## 2023-10-14 DIAGNOSIS — Z483 Aftercare following surgery for neoplasm: Secondary | ICD-10-CM | POA: Diagnosis not present

## 2023-10-14 DIAGNOSIS — D630 Anemia in neoplastic disease: Secondary | ICD-10-CM | POA: Diagnosis not present

## 2023-10-14 DIAGNOSIS — Z1612 Extended spectrum beta lactamase (ESBL) resistance: Secondary | ICD-10-CM | POA: Diagnosis not present

## 2023-10-14 DIAGNOSIS — N3001 Acute cystitis with hematuria: Secondary | ICD-10-CM | POA: Diagnosis not present

## 2023-10-14 DIAGNOSIS — B961 Klebsiella pneumoniae [K. pneumoniae] as the cause of diseases classified elsewhere: Secondary | ICD-10-CM | POA: Diagnosis not present

## 2023-10-14 DIAGNOSIS — C678 Malignant neoplasm of overlapping sites of bladder: Secondary | ICD-10-CM | POA: Diagnosis not present

## 2023-10-14 DIAGNOSIS — I119 Hypertensive heart disease without heart failure: Secondary | ICD-10-CM | POA: Diagnosis not present

## 2023-10-15 NOTE — Progress Notes (Deleted)
 [facility] OFFICE PROGRESS NOTE  Karen Nottingham, MD [address] [place] [address]  DIAGNOSIS: Iron Deficiency Anemia   PRIOR THERAPY: None  CURRENT THERAPY: 1) Iron Supplement with *** p.o. daily  INTERVAL HISTORY: [name] 89 y.o. female returns to the clinic today for a follow up visit. The patient was lost to follow up since 2023. She does not check her voicemails and she does not answer calls from numbers she does not recognisze. She was referred to the clinic for anemia that was felt to be multifactorial with IDA and anemia of chronic kidney disease. However, her hemoglobin was above 10 which he would not recommend EPO injections. She was instructed to take iron supplements.   Since being seen, she was re-referred to the clinic for anemia.   Overall, the patient reports ongoing weakness. She denies any significant dyspnea on exertion since she will rest if she feels like she is becoming short of breath. She is not taking any iron supplements. She reports some cold intolerance since being put on a blood thinner. She denies any syncopal episodes. She denies any known abnormal bleeding including epistaxis, gingival bleeding, hemoptysis, hematemesis, melena, or hematochezia.   She denies ever needing an iron infusion or blood transfusion before. She is presently not on any iron supplements. ***. She denies any fevers.  She may have night sweats if she has too many covers on, but otherwise denies any night sweats.  It appears that the patient lost approximately *** pounds since ***. She denies any lymphadenopathy or abdominal pain.  She reports she previously saw a nephrologist but it has been many years since she was discharged.  She denies any NSAID use.  She has some chronic kidney disease.       MEDICAL HISTORY: Past Medical History:  Diagnosis Date   Anemia    Arthritis    Colon polyp    Diabetes mellitus    type 1   Heart murmur     Hyperlipidemia    Hypertension    Hypothyroidism    Pneumonia    Poor appetite    Thyroid  disease     ALLERGIES:  has no known allergies.  MEDICATIONS:  Current Outpatient Medications  Medication Sig Dispense Refill   acetaminophen  (TYLENOL ) 500 MG tablet Take 500 mg by mouth every 6 (six) hours as needed for moderate pain (pain score 4-6).     amiodarone  (PACERONE ) 100 MG tablet Take 1 tablet (100 mg total) by mouth daily. 90 tablet 3   apixaban  (ELIQUIS ) 2.5 MG TABS tablet TAKE 1 TABLET BY MOUTH TWICE A DAY 180 tablet 1   Ascorbic Acid (VITAMIN C) 100 MG tablet Take 100 mg by mouth daily.     Calcium  Polycarbophil (FIBER-CAPS PO) Take 3-5 capsules by mouth as needed (constipation).     furosemide  (LASIX ) 20 MG tablet TAKE 1 TABLET (20 MG TOTAL) BY MOUTH DAILY AS NEEDED FOR FLUID OR EDEMA. 90 tablet 2   insulin  aspart (NOVOLOG  FLEXPEN) 100 UNIT/ML FlexPen Inject 8 Units into the skin 3 (three) times daily with meals. (Patient taking differently: Inject 11 Units into the skin See admin instructions. If BG is >220) 15 mL 0   insulin  glargine, 2 Unit Dial , (TOUJEO  MAX SOLOSTAR) 300 UNIT/ML Solostar Pen Inject 14 Units into the skin at bedtime. (Patient taking differently: Inject 12 Units into the skin at bedtime.) 3 mL 0   levothyroxine  (SYNTHROID ) 112 MCG tablet Take 112 mcg by  mouth daily before breakfast.     mirtazapine  (REMERON ) 30 MG tablet Take 30 mg by mouth at bedtime as needed (sleep).     pantoprazole  (PROTONIX ) 40 MG tablet Take 1 tablet (40 mg total) by mouth daily at 6 (six) AM. 30 tablet 1   TIADYLT  ER 180 MG 24 hr capsule TAKE 1 CAPSULE BY MOUTH EVERY DAY 90 capsule 3   trimethoprim  (TRIMPEX ) 100 MG tablet Take 100 mg by mouth daily.     No current facility-administered medications for this visit.    SURGICAL HISTORY:  Past Surgical History:  Procedure Laterality Date   ABDOMINAL HYSTERECTOMY     APPENDECTOMY     BIOPSY  02/07/2022   Procedure: BIOPSY;  Surgeon:  Aneita Gwendlyn DASEN, MD;  Location: [facility] ENDOSCOPY;  Service: Gastroenterology;;   BREAST EXCISIONAL BIOPSY Right    CARDIOVASCULAR STRESS TEST  07/15/2006   Normal scan, no ECG changes   COLONOSCOPY WITH PROPOFOL  N/A 02/07/2022   Procedure: COLONOSCOPY WITH PROPOFOL ;  Surgeon: Aneita Gwendlyn DASEN, MD;  Location: [facility] ENDOSCOPY;  Service: Gastroenterology;  Laterality: N/A;   CYSTOSCOPY WITH BIOPSY Bilateral 11/19/2022   Procedure: CYSTOSCOPY WITH TRANSURETHRAL RESECTION OF BLADDER TUMOR BILATERAL RETROGRADE PYELOGRAM;  Surgeon: Carolee Sherwood JONETTA DOUGLAS, MD;  Location: [facility] ORS;  Service: Urology;  Laterality: Bilateral;  1 HR FOR CASE   CYSTOSCOPY WITH BIOPSY Bilateral 09/12/2023   Procedure: CYSTOSCOPY WITH BIOPSY FULGURATION  BILATERAL RETROGRADE PYELOGRAM, BILATERAL DIAGNOSTIC URETEROSCOPY;  Surgeon: Carolee Sherwood JONETTA DOUGLAS, MD;  Location: [facility] ORS;  Service: Urology;  Laterality: Bilateral;   ESOPHAGOGASTRODUODENOSCOPY (EGD) WITH PROPOFOL  N/A 02/07/2022   Procedure: ESOPHAGOGASTRODUODENOSCOPY (EGD) WITH PROPOFOL ;  Surgeon: Aneita Gwendlyn DASEN, MD;  Location: [facility] ENDOSCOPY;  Service: Gastroenterology;  Laterality: N/A;   TRANSTHORACIC ECHOCARDIOGRAM  01/08/2011   EF 60-65%, moderae mitral regurg, LA mild-moderately dilated,   TRANSURETHRAL RESECTION OF BLADDER TUMOR N/A 04/04/2023   Procedure: TRANSURETHRAL RESECTION OF BLADDER TUMOR (TURBT);  Surgeon: Carolee Sherwood JONETTA DOUGLAS, MD;  Location: [facility];  Service: Urology;  Laterality: N/A;  1 HR FOR CASE    REVIEW OF SYSTEMS:   Review of Systems  Constitutional: Negative for appetite change, chills, fatigue, fever and unexpected weight change.  HENT:   Negative for mouth sores, nosebleeds, sore throat and trouble swallowing.   Eyes: Negative for eye problems and icterus.  Respiratory: Negative for cough, hemoptysis, shortness of breath and wheezing.   Cardiovascular: Negative for chest pain and leg swelling.  Gastrointestinal: Negative for abdominal pain, constipation, diarrhea,  nausea and vomiting.  Genitourinary: Negative for bladder incontinence, difficulty urinating, dysuria, frequency and hematuria.   Musculoskeletal: Negative for back pain, gait problem, neck pain and neck stiffness.  Skin: Negative for itching and rash.  Neurological: Negative for dizziness, extremity weakness, gait problem, headaches, light-headedness and seizures.  Hematological: Negative for adenopathy. Does not bruise/bleed easily.  Psychiatric/Behavioral: Negative for confusion, depression and sleep disturbance. The patient is not nervous/anxious.     PHYSICAL EXAMINATION:  There were no vitals taken for this visit.  ECOG PERFORMANCE STATUS: {CHL ONC ECOG [id number]  Physical Exam  Constitutional: Oriented to person, place, and time and well-developed, well-nourished, and in no distress. No distress.  HENT:  Head: Normocephalic and atraumatic.  Mouth/Throat: Oropharynx is clear and moist. No oropharyngeal exudate.  Eyes: Conjunctivae are normal. Right eye exhibits no discharge. Left eye exhibits no discharge. No scleral icterus.  Neck: Normal range of motion. Neck supple.  Cardiovascular: Normal rate, regular rhythm, normal heart sounds and  intact distal pulses.   Pulmonary/Chest: Effort normal and breath sounds normal. No respiratory distress. No wheezes. No rales.  Abdominal: Soft. Bowel sounds are normal. Exhibits no distension and no mass. There is no tenderness.  Musculoskeletal: Normal range of motion. Exhibits no edema.  Lymphadenopathy:    No cervical adenopathy.  Neurological: Alert and oriented to person, place, and time. Exhibits normal muscle tone. Gait normal. Coordination normal.  Skin: Skin is warm and dry. No rash noted. Not diaphoretic. No erythema. No pallor.  Psychiatric: Mood, memory and judgment normal.  Vitals reviewed.  LABORATORY DATA: Lab Results  Component Value Date   WBC 6.8 09/26/2023   HGB 11.8 (L) 09/26/2023   HCT 37.2 09/26/2023   MCV 91.6  09/26/2023   PLT 213 09/26/2023      Chemistry      Component Value Date/Time   NA 128 (L) 09/27/2023 0602   NA 135 04/16/2020 1703   K 4.3 09/27/2023 0602   CL 97 (L) 09/27/2023 0602   CO2 24 09/27/2023 0602   BUN 29 (H) 09/27/2023 0602   BUN 20 04/16/2020 1703   CREATININE 0.83 09/27/2023 0602   CREATININE 1.16 (H) 04/05/2022 1322      Component Value Date/Time   CALCIUM  8.1 (L) 09/27/2023 0602   ALKPHOS 72 09/27/2023 0602   AST 40 09/27/2023 0602   AST 73 (H) 04/05/2022 1322   ALT 30 09/27/2023 0602   ALT 103 (H) 04/05/2022 1322   BILITOT 0.3 09/27/2023 0602   BILITOT 0.4 04/05/2022 1322       RADIOGRAPHIC STUDIES:  CT Renal Stone Study Result Date: 09/16/2023 CLINICAL DATA:  Bladder biopsy with weakness and vomiting EXAM: CT ABDOMEN AND PELVIS WITHOUT CONTRAST TECHNIQUE: Multidetector CT imaging of the abdomen and pelvis was performed following the standard protocol without IV contrast. RADIATION DOSE REDUCTION: This exam was performed according to the departmental dose-optimization program which includes automated exposure control, adjustment of the mA and/or kV according to patient size and/or use of iterative reconstruction technique. COMPARISON:  CT 07/13/2023 FINDINGS: Lower chest: Lung bases demonstrate no acute airspace disease. Mild cardiomegaly. Hepatobiliary: Cholecystectomy. Prominent common bile duct presumably due to postsurgical change. Pancreas: Atrophic.  No inflammation Spleen: Normal in size without focal abnormality. Adrenals/Urinary Tract: Adrenal glands are normal. Kidneys show no hydronephrosis. Diffuse bladder wall thickening. Moderate air in the urinary bladder Stomach/Bowel: Stomach is within normal limits. No evidence of bowel wall thickening, distention, or inflammatory changes. Vascular/Lymphatic: Moderate aortic atherosclerosis. No aneurysm. No suspicious lymph nodes Reproductive: Status post hysterectomy. No adnexal masses. Other: Negative for free  air or free fluid. Postoperative changes along the anterior pelvic wall. Musculoskeletal: No acute or suspicious osseous abnormality IMPRESSION: 1. Diffuse bladder wall thickening. Moderate air in the urinary bladder, correlate for any recent instrumentation. 2. Otherwise no CT evidence for acute intra-abdominal or pelvic abnormality. 3. Aortic atherosclerosis. Aortic Atherosclerosis (ICD10-I70.0). Electronically Signed   By: Luke Bun M.D.   On: 09/16/2023 19:43   DG Chest Portable 1 View Result Date: 09/16/2023 CLINICAL DATA:  Weakness, confusion EXAM: PORTABLE CHEST 1 VIEW COMPARISON:  07/31/2023 FINDINGS: Single frontal view of the chest demonstrates a stable cardiac silhouette. No airspace disease, effusion, or pneumothorax. Chronic scarring unchanged. No acute bony abnormalities. IMPRESSION: 1. Stable chest, no acute process. Electronically Signed   By: Ozell Daring M.D.   On: 09/16/2023 17:53     ASSESSMENT/PLAN:  This is a very pleasant 89 year old African-American female referred to clinic for anemia.  The patient had several lab studies performed today including a CBC, CMP, iron studies, epo, ferritin, B12, folic acid , and SPEP with immunofixation.    Her CBC from today demonstrates normal white blood cell count and platelet count.  Her hemoglobin is slightly low at ***.  Her MCV is within normal limits at ***.  Her CMP shows ***.     Her iron show some low end of normal iron and saturation.  Therefore she was sent a prescription of Integra plus  to take 1 tablet p.o. daily.  Her other labs are pending at this time.***  Dr. Sherrod believes that her anemia could be multifactorial. Her anemia could also be anemia of chronic disease and kidney disease. She has some chronic kidney disease although her creatinine***. Dr. Sherrod discussed that with anemia secondary to kidney disease that we sometimes may administer EPO injections in the clinic, however, Dr. Sherrod would not recommend  that for this patient while her hemoglobin is around 10.  He may consider it in the future if she has new or worsening anemia with hemoglobin around 8 or less. ***  Dr. Sherrod recommends that the patient continue with her iron supplement for now and close monitoring.  We will see her back for follow-up visit in 2 months for repeat CBC, iron studies, and ferritin.  Of course, if there are any abnormalities on her pending lab work, I will call the patient sooner and relay any further instructions.   The patient was advised to call immediately if she has any concerning symptoms in the interval. The patient voices understanding of current disease status and treatment options and is in agreement with the current care plan. All questions were answered. The patient knows to call the clinic with any problems, questions or concerns. We can certainly see the patient much sooner if necessary           No orders of the defined types were placed in this encounter.    I spent {CHL ONC TIME VISIT - DTPQU:[id number]} counseling the patient face to face. The total time spent in the appointment was {CHL ONC TIME VISIT - DTPQU:[id number]}.  Kassem Kibbe L Tamana Hatfield, PA-C 10/15/23

## 2023-10-17 ENCOUNTER — Other Ambulatory Visit: Payer: Self-pay | Admitting: Physician Assistant

## 2023-10-17 DIAGNOSIS — B961 Klebsiella pneumoniae [K. pneumoniae] as the cause of diseases classified elsewhere: Secondary | ICD-10-CM | POA: Diagnosis not present

## 2023-10-17 DIAGNOSIS — I119 Hypertensive heart disease without heart failure: Secondary | ICD-10-CM | POA: Diagnosis not present

## 2023-10-17 DIAGNOSIS — D649 Anemia, unspecified: Secondary | ICD-10-CM

## 2023-10-17 DIAGNOSIS — E1022 Type 1 diabetes mellitus with diabetic chronic kidney disease: Secondary | ICD-10-CM | POA: Diagnosis not present

## 2023-10-17 DIAGNOSIS — Z1612 Extended spectrum beta lactamase (ESBL) resistance: Secondary | ICD-10-CM | POA: Diagnosis not present

## 2023-10-17 DIAGNOSIS — C678 Malignant neoplasm of overlapping sites of bladder: Secondary | ICD-10-CM | POA: Diagnosis not present

## 2023-10-17 DIAGNOSIS — N3021 Other chronic cystitis with hematuria: Secondary | ICD-10-CM | POA: Diagnosis not present

## 2023-10-17 DIAGNOSIS — D630 Anemia in neoplastic disease: Secondary | ICD-10-CM | POA: Diagnosis not present

## 2023-10-17 DIAGNOSIS — Z483 Aftercare following surgery for neoplasm: Secondary | ICD-10-CM | POA: Diagnosis not present

## 2023-10-17 DIAGNOSIS — N3001 Acute cystitis with hematuria: Secondary | ICD-10-CM | POA: Diagnosis not present

## 2023-10-18 ENCOUNTER — Inpatient Hospital Stay: Payer: Medicare PPO

## 2023-10-18 ENCOUNTER — Inpatient Hospital Stay: Payer: Medicare PPO | Admitting: Physician Assistant

## 2023-10-18 DIAGNOSIS — Z1612 Extended spectrum beta lactamase (ESBL) resistance: Secondary | ICD-10-CM | POA: Diagnosis not present

## 2023-10-18 DIAGNOSIS — E1022 Type 1 diabetes mellitus with diabetic chronic kidney disease: Secondary | ICD-10-CM | POA: Diagnosis not present

## 2023-10-18 DIAGNOSIS — B961 Klebsiella pneumoniae [K. pneumoniae] as the cause of diseases classified elsewhere: Secondary | ICD-10-CM | POA: Diagnosis not present

## 2023-10-18 DIAGNOSIS — I119 Hypertensive heart disease without heart failure: Secondary | ICD-10-CM | POA: Diagnosis not present

## 2023-10-18 DIAGNOSIS — N3001 Acute cystitis with hematuria: Secondary | ICD-10-CM | POA: Diagnosis not present

## 2023-10-18 DIAGNOSIS — N3021 Other chronic cystitis with hematuria: Secondary | ICD-10-CM | POA: Diagnosis not present

## 2023-10-18 DIAGNOSIS — C678 Malignant neoplasm of overlapping sites of bladder: Secondary | ICD-10-CM | POA: Diagnosis not present

## 2023-10-18 DIAGNOSIS — D630 Anemia in neoplastic disease: Secondary | ICD-10-CM | POA: Diagnosis not present

## 2023-10-18 DIAGNOSIS — Z483 Aftercare following surgery for neoplasm: Secondary | ICD-10-CM | POA: Diagnosis not present

## 2023-10-20 ENCOUNTER — Telehealth: Payer: Self-pay | Admitting: Physician Assistant

## 2023-10-20 DIAGNOSIS — N3001 Acute cystitis with hematuria: Secondary | ICD-10-CM | POA: Diagnosis not present

## 2023-10-20 DIAGNOSIS — Z1612 Extended spectrum beta lactamase (ESBL) resistance: Secondary | ICD-10-CM | POA: Diagnosis not present

## 2023-10-20 DIAGNOSIS — Z483 Aftercare following surgery for neoplasm: Secondary | ICD-10-CM | POA: Diagnosis not present

## 2023-10-20 DIAGNOSIS — E1022 Type 1 diabetes mellitus with diabetic chronic kidney disease: Secondary | ICD-10-CM | POA: Diagnosis not present

## 2023-10-20 DIAGNOSIS — B961 Klebsiella pneumoniae [K. pneumoniae] as the cause of diseases classified elsewhere: Secondary | ICD-10-CM | POA: Diagnosis not present

## 2023-10-20 DIAGNOSIS — C678 Malignant neoplasm of overlapping sites of bladder: Secondary | ICD-10-CM | POA: Diagnosis not present

## 2023-10-20 DIAGNOSIS — D630 Anemia in neoplastic disease: Secondary | ICD-10-CM | POA: Diagnosis not present

## 2023-10-20 DIAGNOSIS — N3021 Other chronic cystitis with hematuria: Secondary | ICD-10-CM | POA: Diagnosis not present

## 2023-10-20 DIAGNOSIS — I119 Hypertensive heart disease without heart failure: Secondary | ICD-10-CM | POA: Diagnosis not present

## 2023-10-20 NOTE — Telephone Encounter (Signed)
 Rescheduled appointments per scheduling message. Patient will be mailed an appointment reminder and is active on MyChart.

## 2023-10-25 DIAGNOSIS — C678 Malignant neoplasm of overlapping sites of bladder: Secondary | ICD-10-CM | POA: Diagnosis not present

## 2023-10-25 DIAGNOSIS — R8271 Bacteriuria: Secondary | ICD-10-CM | POA: Diagnosis not present

## 2023-10-26 DIAGNOSIS — Z1612 Extended spectrum beta lactamase (ESBL) resistance: Secondary | ICD-10-CM | POA: Diagnosis not present

## 2023-10-26 DIAGNOSIS — C678 Malignant neoplasm of overlapping sites of bladder: Secondary | ICD-10-CM | POA: Diagnosis not present

## 2023-10-26 DIAGNOSIS — N3021 Other chronic cystitis with hematuria: Secondary | ICD-10-CM | POA: Diagnosis not present

## 2023-10-26 DIAGNOSIS — D630 Anemia in neoplastic disease: Secondary | ICD-10-CM | POA: Diagnosis not present

## 2023-10-26 DIAGNOSIS — B961 Klebsiella pneumoniae [K. pneumoniae] as the cause of diseases classified elsewhere: Secondary | ICD-10-CM | POA: Diagnosis not present

## 2023-10-26 DIAGNOSIS — I119 Hypertensive heart disease without heart failure: Secondary | ICD-10-CM | POA: Diagnosis not present

## 2023-10-26 DIAGNOSIS — Z483 Aftercare following surgery for neoplasm: Secondary | ICD-10-CM | POA: Diagnosis not present

## 2023-10-26 DIAGNOSIS — E1022 Type 1 diabetes mellitus with diabetic chronic kidney disease: Secondary | ICD-10-CM | POA: Diagnosis not present

## 2023-10-26 DIAGNOSIS — N3001 Acute cystitis with hematuria: Secondary | ICD-10-CM | POA: Diagnosis not present

## 2023-10-27 NOTE — Progress Notes (Deleted)
[facility] OFFICE PROGRESS NOTE  [name], MD [address] [place] [address]  DIAGNOSIS: Iron Deficiency Anemia   PRIOR THERAPY: None  CURRENT THERAPY: 1) Iron Supplement with *** p.o. daily  INTERVAL HISTORY: [name] 89 y.o. female returns to the clinic today for a follow up visit. The patient was lost to follow up since 2023. She does not check her voicemails and she does not answer calls from numbers she does not recognisze. She was referred to the clinic for anemia that was felt to be multifactorial with IDA and anemia of chronic kidney disease. However, her hemoglobin was above 10 which he would not recommend EPO injections. She was instructed to take iron supplements.   Since being seen, she was re-referred to the clinic for anemia.   Overall, the patient reports ongoing weakness. She denies any significant dyspnea on exertion since she will rest if she feels like she is becoming short of breath. She is not taking any iron supplements. She reports some cold intolerance since being put on a blood thinner. She denies any syncopal episodes. She denies any known abnormal bleeding including epistaxis, gingival bleeding, hemoptysis, hematemesis, melena, or hematochezia.   She denies ever needing an iron infusion or blood transfusion before. She is presently not on any iron supplements. ***. She denies any fevers.  She may have night sweats if she has too many covers on, but otherwise denies any night sweats.  It appears that the patient lost approximately *** pounds since ***. She denies any lymphadenopathy or abdominal pain.  She reports she previously saw a nephrologist but it has been many years since she was discharged.  She denies any NSAID use.  She has some chronic kidney disease.       MEDICAL HISTORY: Past Medical History:  Diagnosis Date   Anemia    Arthritis    Colon polyp    Diabetes mellitus    type 1   Heart murmur     Hyperlipidemia    Hypertension    Hypothyroidism    Pneumonia    Poor appetite    Thyroid disease     ALLERGIES:  has no known allergies.  MEDICATIONS:  Current Outpatient Medications  Medication Sig Dispense Refill   acetaminophen (TYLENOL) 500 MG tablet Take 500 mg by mouth every 6 (six) hours as needed for moderate pain (pain score 4-6).     amiodarone (PACERONE) 100 MG tablet Take 1 tablet (100 mg total) by mouth daily. 90 tablet 3   apixaban (ELIQUIS) 2.5 MG TABS tablet TAKE 1 TABLET BY MOUTH TWICE A DAY 180 tablet 1   Ascorbic Acid (VITAMIN C) 100 MG tablet Take 100 mg by mouth daily.     Calcium Polycarbophil (FIBER-CAPS PO) Take 3-5 capsules by mouth as needed (constipation).     furosemide (LASIX) 20 MG tablet TAKE 1 TABLET (20 MG TOTAL) BY MOUTH DAILY AS NEEDED FOR FLUID OR EDEMA. 90 tablet 2   insulin aspart (NOVOLOG FLEXPEN) 100 UNIT/ML FlexPen Inject 8 Units into the skin 3 (three) times daily with meals. (Patient taking differently: Inject 11 Units into the skin See admin instructions. If BG is >220) 15 mL 0   insulin glargine, 2 Unit Dial, (TOUJEO MAX SOLOSTAR) 300 UNIT/ML Solostar Pen Inject 14 Units into the skin at bedtime. (Patient taking differently: Inject 12 Units into the skin at bedtime.) 3 mL 0   levothyroxine (SYNTHROID) 112 MCG tablet Take 112 mcg by  mouth daily before breakfast.     mirtazapine (REMERON) 30 MG tablet Take 30 mg by mouth at bedtime as needed (sleep).     pantoprazole (PROTONIX) 40 MG tablet Take 1 tablet (40 mg total) by mouth daily at 6 (six) AM. 30 tablet 1   TIADYLT ER 180 MG 24 hr capsule TAKE 1 CAPSULE BY MOUTH EVERY DAY 90 capsule 3   trimethoprim (TRIMPEX) 100 MG tablet Take 100 mg by mouth daily.     No current facility-administered medications for this visit.    SURGICAL HISTORY:  Past Surgical History:  Procedure Laterality Date   ABDOMINAL HYSTERECTOMY     APPENDECTOMY     BIOPSY  02/07/2022   Procedure: BIOPSY;  Surgeon:  Meryl Dare, MD;  Location: [facility] ENDOSCOPY;  Service: Gastroenterology;;   BREAST EXCISIONAL BIOPSY Right    CARDIOVASCULAR STRESS TEST  07/15/2006   Normal scan, no ECG changes   COLONOSCOPY WITH PROPOFOL N/A 02/07/2022   Procedure: COLONOSCOPY WITH PROPOFOL;  Surgeon: Meryl Dare, MD;  Location: [facility] ENDOSCOPY;  Service: Gastroenterology;  Laterality: N/A;   CYSTOSCOPY WITH BIOPSY Bilateral 11/19/2022   Procedure: CYSTOSCOPY WITH TRANSURETHRAL RESECTION OF BLADDER TUMOR BILATERAL RETROGRADE PYELOGRAM;  Surgeon: Crista Elliot, MD;  Location: [facility];  Service: Urology;  Laterality: Bilateral;  1 HR FOR CASE   CYSTOSCOPY WITH BIOPSY Bilateral 09/12/2023   Procedure: CYSTOSCOPY WITH BIOPSY FULGURATION  BILATERAL RETROGRADE PYELOGRAM, BILATERAL DIAGNOSTIC URETEROSCOPY;  Surgeon: Crista Elliot, MD;  Location: [facility];  Service: Urology;  Laterality: Bilateral;   ESOPHAGOGASTRODUODENOSCOPY (EGD) WITH PROPOFOL N/A 02/07/2022   Procedure: ESOPHAGOGASTRODUODENOSCOPY (EGD) WITH PROPOFOL;  Surgeon: Meryl Dare, MD;  Location: [facility] ENDOSCOPY;  Service: Gastroenterology;  Laterality: N/A;   TRANSTHORACIC ECHOCARDIOGRAM  01/08/2011   EF 60-65%, moderae mitral regurg, LA mild-moderately dilated,   TRANSURETHRAL RESECTION OF BLADDER TUMOR N/A 04/04/2023   Procedure: TRANSURETHRAL RESECTION OF BLADDER TUMOR (TURBT);  Surgeon: Crista Elliot, MD;  Location: [facility];  Service: Urology;  Laterality: N/A;  1 HR FOR CASE    REVIEW OF SYSTEMS:   Review of Systems  Constitutional: Negative for appetite change, chills, fatigue, fever and unexpected weight change.  HENT:   Negative for mouth sores, nosebleeds, sore throat and trouble swallowing.   Eyes: Negative for eye problems and icterus.  Respiratory: Negative for cough, hemoptysis, shortness of breath and wheezing.   Cardiovascular: Negative for chest pain and leg swelling.  Gastrointestinal: Negative for abdominal pain, constipation, diarrhea,  nausea and vomiting.  Genitourinary: Negative for bladder incontinence, difficulty urinating, dysuria, frequency and hematuria.   Musculoskeletal: Negative for back pain, gait problem, neck pain and neck stiffness.  Skin: Negative for itching and rash.  Neurological: Negative for dizziness, extremity weakness, gait problem, headaches, light-headedness and seizures.  Hematological: Negative for adenopathy. Does not bruise/bleed easily.  Psychiatric/Behavioral: Negative for confusion, depression and sleep disturbance. The patient is not nervous/anxious.     PHYSICAL EXAMINATION:  There were no vitals taken for this visit.  ECOG PERFORMANCE STATUS: {CHL ONC ECOG [id number]  Physical Exam  Constitutional: Oriented to person, place, and time and well-developed, well-nourished, and in no distress. No distress.  HENT:  Head: Normocephalic and atraumatic.  Mouth/Throat: Oropharynx is clear and moist. No oropharyngeal exudate.  Eyes: Conjunctivae are normal. Right eye exhibits no discharge. Left eye exhibits no discharge. No scleral icterus.  Neck: Normal range of motion. Neck supple.  Cardiovascular: Normal rate, regular rhythm, normal heart sounds and  intact distal pulses.   Pulmonary/Chest: Effort normal and breath sounds normal. No respiratory distress. No wheezes. No rales.  Abdominal: Soft. Bowel sounds are normal. Exhibits no distension and no mass. There is no tenderness.  Musculoskeletal: Normal range of motion. Exhibits no edema.  Lymphadenopathy:    No cervical adenopathy.  Neurological: Alert and oriented to person, place, and time. Exhibits normal muscle tone. Gait normal. Coordination normal.  Skin: Skin is warm and dry. No rash noted. Not diaphoretic. No erythema. No pallor.  Psychiatric: Mood, memory and judgment normal.  Vitals reviewed.  LABORATORY DATA: Lab Results  Component Value Date   WBC 6.8 09/26/2023   HGB 11.8 (L) 09/26/2023   HCT 37.2 09/26/2023   MCV 91.6  09/26/2023   PLT 213 09/26/2023      Chemistry      Component Value Date/Time   NA 128 (L) 09/27/2023 0602   NA 135 04/16/2020 1703   K 4.3 09/27/2023 0602   CL 97 (L) 09/27/2023 0602   CO2 24 09/27/2023 0602   BUN 29 (H) 09/27/2023 0602   BUN 20 04/16/2020 1703   CREATININE 0.83 09/27/2023 0602   CREATININE 1.16 (H) 04/05/2022 1322      Component Value Date/Time   CALCIUM 8.1 (L) 09/27/2023 0602   ALKPHOS 72 09/27/2023 0602   AST 40 09/27/2023 0602   AST 73 (H) 04/05/2022 1322   ALT 30 09/27/2023 0602   ALT 103 (H) 04/05/2022 1322   BILITOT 0.3 09/27/2023 0602   BILITOT 0.4 04/05/2022 1322       RADIOGRAPHIC STUDIES:  No results found.    ASSESSMENT/PLAN:  This is a very pleasant 89 year old African-American female referred to clinic for anemia.  The patient had several lab studies performed today including a CBC, CMP, iron studies, epo, ferritin, B12, folic acid, and SPEP with immunofixation.    Her CBC from today demonstrates normal white blood cell count and platelet count.  Her hemoglobin is slightly low at ***.  Her MCV is within normal limits at ***.  Her CMP shows ***.     Her iron show some low end of normal iron and saturation.  Therefore she was sent a prescription of Integra plus to take 1 tablet p.o. daily.  Her other labs are pending at this time.***  Dr. Arbutus Ped believes that her anemia could be multifactorial. Her anemia could also be anemia of chronic disease and kidney disease. She has some chronic kidney disease although her creatinine***. Dr. Arbutus Ped discussed that with anemia secondary to kidney disease that we sometimes may administer EPO injections in the clinic, however, Dr. Arbutus Ped would not recommend that for this patient while her hemoglobin is around 10.  He may consider it in the future if she has new or worsening anemia with hemoglobin around 8 or less. ***  Dr. Arbutus Ped recommends that the patient continue with her iron supplement for now  and close monitoring.  We will see her back for follow-up visit in 2 months for repeat CBC, iron studies, and ferritin.  Of course, if there are any abnormalities on her pending lab work, I will call the patient sooner and relay any further instructions.   The patient was advised to call immediately if she has any concerning symptoms in the interval. The patient voices understanding of current disease status and treatment options and is in agreement with the current care plan. All questions were answered. The patient knows to call the clinic with any problems, questions or  concerns. We can certainly see the patient much sooner if necessary           No orders of the defined types were placed in this encounter.    I spent {CHL ONC TIME VISIT - ZOXWR:[id number]} counseling the patient face to face. The total time spent in the appointment was {CHL ONC TIME VISIT - BJYNW:[id number]}.  Eliane Hammersmith L Yenifer Saccente, PA-C 10/27/23

## 2023-11-01 DIAGNOSIS — C678 Malignant neoplasm of overlapping sites of bladder: Secondary | ICD-10-CM | POA: Diagnosis not present

## 2023-11-01 DIAGNOSIS — Z5111 Encounter for antineoplastic chemotherapy: Secondary | ICD-10-CM | POA: Diagnosis not present

## 2023-11-02 ENCOUNTER — Inpatient Hospital Stay: Payer: Medicare PPO | Admitting: Physician Assistant

## 2023-11-02 ENCOUNTER — Inpatient Hospital Stay: Payer: Medicare PPO | Attending: Internal Medicine

## 2023-11-08 DIAGNOSIS — R8271 Bacteriuria: Secondary | ICD-10-CM | POA: Diagnosis not present

## 2023-11-08 DIAGNOSIS — C678 Malignant neoplasm of overlapping sites of bladder: Secondary | ICD-10-CM | POA: Diagnosis not present

## 2023-11-09 ENCOUNTER — Other Ambulatory Visit: Payer: Self-pay | Admitting: Internal Medicine

## 2023-11-09 DIAGNOSIS — I480 Paroxysmal atrial fibrillation: Secondary | ICD-10-CM

## 2023-11-09 NOTE — Telephone Encounter (Signed)
Eliquis 2.5mg  refill request received. Patient is 89 years old, weight-51kg, Crea-0.83 on 09/27/23, Diagnosis-Afib, and last seen by [name] on 06/15/23. Dose is appropriate based on dosing criteria. Will send in refill to requested pharmacy.

## 2023-11-15 DIAGNOSIS — C678 Malignant neoplasm of overlapping sites of bladder: Secondary | ICD-10-CM | POA: Diagnosis not present

## 2023-11-15 DIAGNOSIS — R8271 Bacteriuria: Secondary | ICD-10-CM | POA: Diagnosis not present

## 2023-11-21 NOTE — Telephone Encounter (Signed)
 Patient brought paperwork that needs to be filled out for Dr Maximo Spar.  This paperwork is in the provider's mail box in the back.    11-21-23  VB

## 2023-11-22 DIAGNOSIS — C678 Malignant neoplasm of overlapping sites of bladder: Secondary | ICD-10-CM | POA: Diagnosis not present

## 2023-11-23 ENCOUNTER — Telehealth: Payer: Self-pay | Admitting: Internal Medicine

## 2023-11-23 DIAGNOSIS — E119 Type 2 diabetes mellitus without complications: Secondary | ICD-10-CM | POA: Diagnosis not present

## 2023-11-23 DIAGNOSIS — Z961 Presence of intraocular lens: Secondary | ICD-10-CM | POA: Diagnosis not present

## 2023-11-23 DIAGNOSIS — H52203 Unspecified astigmatism, bilateral: Secondary | ICD-10-CM | POA: Diagnosis not present

## 2023-11-23 NOTE — Telephone Encounter (Signed)
Left message that DMV paperwork was completed by MD.   Will leave paperwork at front desk for pick up, unless patient calls back with other recommendations. Notified patient of this in voicemail.

## 2023-11-24 DIAGNOSIS — K862 Cyst of pancreas: Secondary | ICD-10-CM | POA: Diagnosis not present

## 2023-11-24 DIAGNOSIS — D090 Carcinoma in situ of bladder: Secondary | ICD-10-CM | POA: Diagnosis not present

## 2023-11-24 DIAGNOSIS — R911 Solitary pulmonary nodule: Secondary | ICD-10-CM | POA: Diagnosis not present

## 2023-11-27 ENCOUNTER — Encounter (HOSPITAL_COMMUNITY): Payer: Self-pay

## 2023-11-27 ENCOUNTER — Ambulatory Visit (HOSPITAL_COMMUNITY)
Admission: EM | Admit: 2023-11-27 | Discharge: 2023-11-27 | Disposition: A | Discharge status: Home / Self Care | Payer: Medicare PPO | Attending: Emergency Medicine | Admitting: Emergency Medicine

## 2023-11-27 DIAGNOSIS — K5900 Constipation, unspecified: Secondary | ICD-10-CM | POA: Diagnosis not present

## 2023-11-27 MED ORDER — FLEET ENEMA RE ENEM: 1.0000 | ENEMA | Freq: Every day | RECTAL | 0 refills | Status: AC | PRN

## 2023-11-27 MED ORDER — DOCUSATE SODIUM 100 MG PO CAPS: 100.0000 mg | ORAL_CAPSULE | Freq: Two times a day (BID) | ORAL | 0 refills | Status: AC

## 2023-11-27 NOTE — Discharge Instructions (Addendum)
I suspect your high protein intake may be leading to constipation.  Please stop using the powder and just use your shakes.  Use the Fleet enema today and then daily as needed.  If you find you are not having any results with the enema, develop vomiting, fever or severe abdominal pain seek further care at the nearest emergency department.  For moderate to severe constipation (not having a bowel movement in more than 3 days) then try to use an enema or Miralax once daily until you have a good bowel movement.  It is not a good idea to use an enema or laxatives daily. If you find you are doing this, then please follow up with a gastroenterologist. Otherwise, a medication you could use daily to help with promoting bowel movements is docusate (Colace) 100mg . It is okay to use this 1-2 times daily as a stool softener.  Try to stay active physically including regular exercise 2-3 times a week.  Make sure you hydrate well every day with about 64 ounces of water daily (that is 2 liters).  Try to avoid carb heavy foods, dairy. This includes cutting out breads, pasta, pizza, pastries, potatoes, rice, starchy foods in general. Eat more fiber as listed below:  Salads - kale, spinach, cabbage, spring mix, arugula Fruits - avocadoes, berries (blueberries, raspberries, blackberries), apples, oranges, pomegranate, grapefruit, kiwi Vegetables - asparagus, cauliflower, broccoli, green beans, brussel sprouts, bell peppers, beets; stay away from or limit starchy vegetables like potatoes, carrots, peas Other general foods - kidney beans, egg whites, almonds, walnuts, sunflower seeds, pumpkin seeds, fat free yogurt, almond milk, flax seeds, quinoa, oats  Meat - It is better to eat lean meats and limit your red meat including pork to once a week.  Wild caught fish, chicken breast are good options as they tend to be leaner sources of good protein. Still be mindful of the sodium labels for the meats you buy.  DO NOT EAT ANY FOODS ON  THIS LIST THAT YOU ARE ALLERGIC TO. For more specific needs, I highly recommend consulting a dietician or nutritionist but this can definitely be a good starting point.

## 2023-11-27 NOTE — ED Provider Notes (Signed)
[facility]    CSN: [id number] Arrival date & time: 11/27/23  1350      History   Chief Complaint Chief Complaint  Patient presents with   Constipation    HPI [name] is a 89 y.o. female.   Patient presents to clinic reporting severe constipation.  She has not had a bowel movement in the last 3 days.  Can feel the stool at the end of her rectum, is just unable to push it through.  She has been doing protein shakes as well as protein powder in her water in the mornings.  Does take Metamucil and a daily stool softener.  Has not had any nausea or vomiting.  No current abdominal pain.  Does have rectal pain and pressure with sitting down, reports she can feel the stool at the end of her rectum.  History of type 1 diabetes, A1C 2 months ago was 10.4.  The history is provided by the patient and medical records.  Constipation   Past Medical History:  Diagnosis Date   Anemia    Arthritis    Colon polyp    Diabetes mellitus    type 1   Heart murmur    Hyperlipidemia    Hypertension    Hypothyroidism    Pneumonia    Poor appetite    Thyroid disease     Patient Active Problem List   Diagnosis Date Noted   UTI (urinary tract infection) 09/26/2023   Urinary tract infection due to ESBL Klebsiella 09/24/2023   Malnutrition of moderate degree 08/02/2023   Pressure injury of skin 07/31/2023   Pseudohyponatremia 07/31/2023   History of recurrent UTIs 07/31/2023   Acquired hypercoagulable state (HCC) 04/06/2023   Fatty liver 04/06/2023   Malignant neoplasm of urinary bladder (HCC) 04/06/2023   Fall at home, subsequent encounter 05/03/2022   Unsteady gait 05/03/2022   Scalp laceration 05/03/2022   First degree AV block 05/03/2022   Occult blood in stools    Rectal ulcer    Anemia 02/05/2022   Stage 3b chronic kidney disease (CKD) (HCC) 01/27/2022   Atrial fibrillation with rapid ventricular response (HCC) 01/25/2022   Elevated transaminase level  01/25/2022   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   Hypokalemia 09/02/2021   Acute renal failure superimposed on stage 3a chronic kidney disease (HCC) 09/01/2021   COVID-19 virus infection 03/13/2021   DKA (diabetic ketoacidosis) (HCC) 03/13/2021   Hyperglycemia 03/23/2020   LADA (latent autoimmune diabetes in adults), 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   Hypercholesterolemia 03/15/2018   Left hip pain 06/07/2017   Hypothyroidism 02/03/2015   Essential hypertension 07/10/2013   Uncontrolled type 2 diabetes mellitus with hyperglycemia, with long-term current use of insulin (HCC) 07/10/2013   Upper respiratory infection 07/10/2013   PAF (paroxysmal atrial fibrillation) (HCC) 12/26/2012   Postop check 04/13/2011    Past Surgical History:  Procedure Laterality Date   ABDOMINAL HYSTERECTOMY     APPENDECTOMY     BIOPSY  02/07/2022   Procedure: BIOPSY;  Surgeon: Meryl Dare, MD;  Location: [facility] ENDOSCOPY;  Service: Gastroenterology;;   BREAST EXCISIONAL BIOPSY Right    CARDIOVASCULAR STRESS TEST  07/15/2006   Normal scan, no ECG changes   COLONOSCOPY WITH PROPOFOL N/A 02/07/2022   Procedure: COLONOSCOPY WITH PROPOFOL;  Surgeon: Meryl Dare, MD;  Location: [facility] ENDOSCOPY;  Service: Gastroenterology;  Laterality: N/A;   CYSTOSCOPY WITH BIOPSY Bilateral 11/19/2022   Procedure: CYSTOSCOPY WITH TRANSURETHRAL RESECTION OF BLADDER TUMOR BILATERAL RETROGRADE PYELOGRAM;  Surgeon: Crista Elliot, MD;  Location: [facility] ORS;  Service: Urology;  Laterality: Bilateral;  1 HR FOR CASE   CYSTOSCOPY WITH BIOPSY Bilateral 09/12/2023   Procedure: CYSTOSCOPY WITH BIOPSY FULGURATION  BILATERAL RETROGRADE PYELOGRAM, BILATERAL DIAGNOSTIC URETEROSCOPY;  Surgeon: Crista Elliot, MD;  Location: [facility] ORS;  Service: Urology;  Laterality: Bilateral;   ESOPHAGOGASTRODUODENOSCOPY (EGD) WITH PROPOFOL N/A 02/07/2022   Procedure: ESOPHAGOGASTRODUODENOSCOPY (EGD) WITH PROPOFOL;  Surgeon: Meryl Dare, MD;   Location: [facility] ENDOSCOPY;  Service: Gastroenterology;  Laterality: N/A;   TRANSTHORACIC ECHOCARDIOGRAM  01/08/2011   EF 60-65%, moderae mitral regurg, LA mild-moderately dilated,   TRANSURETHRAL RESECTION OF BLADDER TUMOR N/A 04/04/2023   Procedure: TRANSURETHRAL RESECTION OF BLADDER TUMOR (TURBT);  Surgeon: Crista Elliot, MD;  Location: [facility] ORS;  Service: Urology;  Laterality: N/A;  1 HR FOR CASE    OB History   No obstetric history on file.      Home Medications    Prior to Admission medications   Medication Sig Start Date End Date Taking? Authorizing Provider  docusate sodium (COLACE) 100 MG capsule Take 1 capsule (100 mg total) by mouth every 12 (twelve) hours. 11/27/23  Yes Rinaldo Ratel, [place] N, FNP  sodium phosphate (FLEET) ENEM Place 133 mLs (1 enema total) rectally daily as needed for up to 10 days for severe constipation. 11/27/23 12/07/23 Yes Rinaldo Ratel, [place] N, FNP  acetaminophen (TYLENOL) 500 MG tablet Take 500 mg by mouth every 6 (six) hours as needed for moderate pain (pain score 4-6).   Yes [provider]  amiodarone (PACERONE) 100 MG tablet Take 1 tablet (100 mg total) by mouth daily. 06/15/23  Yes Wittenborn, Gavin Pound, NP  Ascorbic Acid (VITAMIN C) 100 MG tablet Take 100 mg by mouth daily.   Yes [provider]  ELIQUIS 2.5 MG TABS tablet TAKE 1 TABLET BY MOUTH TWICE A DAY 11/09/23  Yes Hilty, Lisette Abu, MD  furosemide (LASIX) 20 MG tablet TAKE 1 TABLET (20 MG TOTAL) BY MOUTH DAILY AS NEEDED FOR FLUID OR EDEMA. 10/13/23  Yes Wittenborn, Deborah, NP  insulin aspart (NOVOLOG FLEXPEN) 100 UNIT/ML FlexPen Inject 8 Units into the skin 3 (three) times daily with meals. Patient taking differently: Inject 11 Units into the skin See admin instructions. If BG is >220 08/04/23  Yes Marinda Elk, MD  insulin glargine, 2 Unit Dial, (TOUJEO MAX SOLOSTAR) 300 UNIT/ML Solostar Pen Inject 14 Units into the skin at bedtime. Patient taking differently: Inject 12 Units into  the skin at bedtime. 08/04/23  Yes Marinda Elk, MD  levothyroxine (SYNTHROID) 112 MCG tablet Take 112 mcg by mouth daily before breakfast.   Yes [provider]  mirtazapine (REMERON) 30 MG tablet Take 30 mg by mouth at bedtime as needed (sleep).   Yes [provider]  TIADYLT ER 180 MG 24 hr capsule TAKE 1 CAPSULE BY MOUTH EVERY DAY 11/01/22  Yes Hilty, Lisette Abu, MD  trimethoprim (TRIMPEX) 100 MG tablet Take 100 mg by mouth daily. 10/27/22  Yes [provider]    Family History Family History  Problem Relation Age of Onset   Diabetes Mother    Hypertension Father    Hyperlipidemia Father    Diabetes Sister    Hypertension Sister    Hyperlipidemia Sister    Hypertension Brother    Stroke Sister    Hypertension Sister    Diabetes Sister    Cancer Daughter    Heart failure Child    Breast  cancer Neg Hx     Social History Social History   Tobacco Use   Smoking status: Former    Types: Cigarettes   Smokeless tobacco: Never   Tobacco comments:    Never a heavy smoker  I pack lasted 3  days  Vaping Use   Vaping status: Never Used  Substance Use Topics   Alcohol use: No   Drug use: No     Allergies   Patient has no known allergies.   Review of Systems Review of Systems  Per HPI   Physical Exam Triage Vital Signs ED Triage Vitals  Encounter Vitals Group     BP 11/27/23 1520 133/65     Systolic BP Percentile --      Diastolic BP Percentile --      Pulse Rate 11/27/23 1520 73     Resp 11/27/23 1520 18     Temp 11/27/23 1520 98.1 F (36.7 C)     Temp Source 11/27/23 1520 Oral     SpO2 11/27/23 1520 97 %     Weight --      Height --      Head Circumference --      Peak Flow --      Pain Score 11/27/23 1517 5     Pain Loc --      Pain Education --      Exclude from Growth Chart --    No data found.  Updated Vital Signs BP 133/65 (BP Location: Right Arm)   Pulse 73   Temp 98.1 F (36.7 C) (Oral)   Resp 18   SpO2  97%   Visual Acuity Right Eye Distance:   Left Eye Distance:   Bilateral Distance:    Right Eye Near:   Left Eye Near:    Bilateral Near:     Physical Exam Vitals and nursing note reviewed.  Constitutional:      Appearance: Normal appearance.  HENT:     Head: Normocephalic and atraumatic.     Right Ear: External ear normal.     Left Ear: External ear normal.     Nose: Nose normal.     Mouth/Throat:     Mouth: Mucous membranes are moist.  Eyes:     Conjunctiva/sclera: Conjunctivae normal.  Cardiovascular:     Rate and Rhythm: Normal rate.  Pulmonary:     Effort: Pulmonary effort is normal. No respiratory distress.  Abdominal:     General: Abdomen is flat. Bowel sounds are normal. There is no distension.     Palpations: Abdomen is soft.     Tenderness: There is no abdominal tenderness. There is no guarding or rebound.  Musculoskeletal:        General: Normal range of motion.  Skin:    General: Skin is warm and dry.  Neurological:     General: No focal deficit present.     Mental Status: She is alert.  Psychiatric:        Mood and Affect: Mood normal.      UC Treatments / Results  Labs (all labs ordered are listed, but only abnormal results are displayed) Labs Reviewed - No data to display  EKG   Radiology No results found.  Procedures Procedures (including critical care time)  Medications Ordered in UC Medications - No data to display  Initial Impression / Assessment and Plan / UC Course  I have reviewed the triage vital signs and the nursing notes.  Pertinent labs & imaging  results that were available during my care of the patient were reviewed by me and considered in my medical decision making (see chart for details).  Vitals and triage reviewed, patient is hemodynamically stable.  Abdomen is soft and nontender with active bowel sounds.  Without rebound or guarding, low concern for surgical abdomen at this time.  With active bowel sounds, lower  concern for obstruction.  Patient is passing gas.  Patient reports she can feel the stool the end of the rectum and has not had a bowel movement in 3 days, suspect constipation.  May be due to uncontrolled type 1 diabetes or increased protein intake.  Requesting an enema, will send in to pharmacy and proper use discussed.  Plan of care, follow-up care and strict emergency precautions given, patient verbalized understanding, no questions at this time.     Final Clinical Impressions(s) / UC Diagnoses   Final diagnoses:  Constipation, unspecified constipation type     Discharge Instructions      I suspect your high protein intake may be leading to constipation.  Please stop using the powder and just use your shakes.  Use the Fleet enema today and then daily as needed.  If you find you are not having any results with the enema, develop vomiting, fever or severe abdominal pain seek further care at the nearest emergency department.  For moderate to severe constipation (not having a bowel movement in more than 3 days) then try to use an enema or Miralax once daily until you have a good bowel movement.  It is not a good idea to use an enema or laxatives daily. If you find you are doing this, then please follow up with a gastroenterologist. Otherwise, a medication you could use daily to help with promoting bowel movements is docusate (Colace) 100mg . It is okay to use this 1-2 times daily as a stool softener.  Try to stay active physically including regular exercise 2-3 times a week.  Make sure you hydrate well every day with about 64 ounces of water daily (that is 2 liters).  Try to avoid carb heavy foods, dairy. This includes cutting out breads, pasta, pizza, pastries, potatoes, rice, starchy foods in general. Eat more fiber as listed below:  Salads - kale, spinach, cabbage, spring mix, arugula Fruits - avocadoes, berries (blueberries, raspberries, blackberries), apples, oranges, pomegranate, grapefruit,  kiwi Vegetables - asparagus, cauliflower, broccoli, green beans, brussel sprouts, bell peppers, beets; stay away from or limit starchy vegetables like potatoes, carrots, peas Other general foods - kidney beans, egg whites, almonds, walnuts, sunflower seeds, pumpkin seeds, fat free yogurt, almond milk, flax seeds, quinoa, oats  Meat - It is better to eat lean meats and limit your red meat including pork to once a week.  Wild caught fish, chicken breast are good options as they tend to be leaner sources of good protein. Still be mindful of the sodium labels for the meats you buy.  DO NOT EAT ANY FOODS ON THIS LIST THAT YOU ARE ALLERGIC TO. For more specific needs, I highly recommend consulting a dietician or nutritionist but this can definitely be a good starting point.      ED Prescriptions     Medication Sig Dispense Auth. Provider   sodium phosphate (FLEET) ENEM Place 133 mLs (1 enema total) rectally daily as needed for up to 10 days for severe constipation. 1,330 mL Clarkson Rosselli, [place] N, FNP   docusate sodium (COLACE) 100 MG capsule Take 1 capsule (100 mg total) by  mouth every 12 (twelve) hours. 60 capsule Darianny Momon, [place] N, [place]      PDMP not reviewed this encounter.   Lesleyanne Politte, [place] N, [place] 11/27/23 [phone]

## 2023-11-27 NOTE — ED Triage Notes (Signed)
Chief Complaint: Constipation and pain with sitting down   Sick exposure: No  Onset: Last BM was 3 days ago.   Prescriptions or OTC medications tried: Yes- Metamucil     with no relief  New foods, medications, or products: No  Recent Travel: No

## 2023-11-29 DIAGNOSIS — C678 Malignant neoplasm of overlapping sites of bladder: Secondary | ICD-10-CM | POA: Diagnosis not present

## 2023-12-07 ENCOUNTER — Other Ambulatory Visit: Payer: Self-pay | Admitting: Internal Medicine

## 2023-12-07 DIAGNOSIS — C678 Malignant neoplasm of overlapping sites of bladder: Secondary | ICD-10-CM | POA: Diagnosis not present

## 2023-12-07 DIAGNOSIS — R8271 Bacteriuria: Secondary | ICD-10-CM | POA: Diagnosis not present

## 2023-12-12 ENCOUNTER — Other Ambulatory Visit: Payer: Self-pay | Admitting: Internal Medicine

## 2023-12-12 DIAGNOSIS — E1065 Type 1 diabetes mellitus with hyperglycemia: Secondary | ICD-10-CM | POA: Diagnosis not present

## 2023-12-15 ENCOUNTER — Telehealth: Payer: Self-pay | Admitting: Internal Medicine

## 2023-12-15 MED ORDER — AMIODARONE HCL 100 MG PO TABS: 100.0000 mg | ORAL_TABLET | Freq: Every day | ORAL | 1 refills | Status: DC

## 2023-12-15 NOTE — Telephone Encounter (Signed)
*  STAT* If patient is at the pharmacy, call can be transferred to refill team.   1. Which medications need to be refilled? (please list name of each medication and dose if known)   amiodarone (PACERONE) 100 MG tablet   2. Would you like to learn more about the convenience, safety, & potential cost savings by using the [facility]?   3. Are you open to using the [facility] (Type [facility]. ).  4. Which pharmacy/location (including street and city if local pharmacy) is medication to be sent to?  [id number] - [place], [place] - [address]   5. Do they need a 30 day or 90 day supply?   30 day  Patient stated she has 2 tablets left.

## 2023-12-15 NOTE — Telephone Encounter (Signed)
 Pt's medication was sent to pt's pharmacy as requested. Confirmation received.

## 2023-12-23 DIAGNOSIS — E1322 Other specified diabetes mellitus with diabetic chronic kidney disease: Secondary | ICD-10-CM | POA: Diagnosis not present

## 2023-12-23 DIAGNOSIS — I129 Hypertensive chronic kidney disease with stage 1 through stage 4 chronic kidney disease, or unspecified chronic kidney disease: Secondary | ICD-10-CM | POA: Diagnosis not present

## 2023-12-23 DIAGNOSIS — E1365 Other specified diabetes mellitus with hyperglycemia: Secondary | ICD-10-CM | POA: Diagnosis not present

## 2023-12-23 DIAGNOSIS — Z794 Long term (current) use of insulin: Secondary | ICD-10-CM | POA: Diagnosis not present

## 2023-12-23 DIAGNOSIS — Z79899 Other long term (current) drug therapy: Secondary | ICD-10-CM | POA: Diagnosis not present

## 2023-12-23 DIAGNOSIS — E890 Postprocedural hypothyroidism: Secondary | ICD-10-CM | POA: Diagnosis not present

## 2023-12-23 DIAGNOSIS — N183 Chronic kidney disease, stage 3 unspecified: Secondary | ICD-10-CM | POA: Diagnosis not present

## 2023-12-25 ENCOUNTER — Other Ambulatory Visit: Payer: Self-pay | Admitting: Internal Medicine

## 2024-01-11 DIAGNOSIS — C678 Malignant neoplasm of overlapping sites of bladder: Secondary | ICD-10-CM | POA: Diagnosis not present

## 2024-01-11 DIAGNOSIS — D090 Carcinoma in situ of bladder: Secondary | ICD-10-CM | POA: Diagnosis not present

## 2024-01-12 ENCOUNTER — Telehealth: Payer: Self-pay | Admitting: Internal Medicine

## 2024-01-12 ENCOUNTER — Other Ambulatory Visit: Payer: Self-pay | Admitting: Urology

## 2024-01-12 NOTE — Telephone Encounter (Signed)
   Pre-operative Risk Assessment    Patient Name: [name]  DOB: April 14, 1935 MRN: [id number]   Date of last office visit: 06/15/23 Date of next office visit: nothing scheduled    Request for Surgical Clearance    Procedure:   Bladder Biopsy and possible resection of Tumor   Date of Surgery:  Clearance 01/23/24                                Surgeon:  Dr. Alvester Morin  Surgeon's Group or Practice Name:  [facility]  Phone number:  [phone]  Fax number:  [phone]    Type of Clearance Requested:   - Medical  - Pharmacy:  Hold Apixaban (Eliquis)     Type of Anesthesia:  General    Additional requests/questions:    Alben Spittle   01/12/2024, 8:41 AM

## 2024-01-13 DIAGNOSIS — D090 Carcinoma in situ of bladder: Secondary | ICD-10-CM | POA: Insufficient documentation

## 2024-01-13 DIAGNOSIS — K862 Cyst of pancreas: Secondary | ICD-10-CM | POA: Insufficient documentation

## 2024-01-13 DIAGNOSIS — R911 Solitary pulmonary nodule: Secondary | ICD-10-CM | POA: Insufficient documentation

## 2024-01-13 NOTE — Telephone Encounter (Signed)
 Called patient to schedule tele visit pre-op clearance appointment., NA, LMAM.

## 2024-01-13 NOTE — Patient Instructions (Addendum)
 DUE TO COVID-19 ONLY TWO VISITORS  (aged 87 and older)  ARE ALLOWED TO COME WITH YOU AND STAY IN THE WAITING ROOM ONLY DURING PRE OP AND PROCEDURE.   **NO VISITORS ARE ALLOWED IN THE SHORT STAY AREA OR RECOVERY ROOM!!**  IF YOU WILL BE ADMITTED INTO THE HOSPITAL YOU ARE ALLOWED ONLY FOUR SUPPORT PEOPLE DURING VISITATION HOURS ONLY (7 AM -8PM)   The support person(s) must pass our screening, gel in and out, and wear a mask at all times, including in the patient's room. Patients must also wear a mask when staff or their support person are in the room. Visitors GUEST BADGE MUST BE WORN VISIBLY  One adult visitor may remain with you overnight and MUST be in the room by 8 P.M.     Your procedure is scheduled on: 01/23/24   Report to [facility] Main Entrance    Report to admitting at : 5:15 AM   Call this number if you have problems the morning of surgery [phone]   Do not eat food or drink: After Midnight.  FOLLOW ANY ADDITIONAL PRE OP INSTRUCTIONS YOU RECEIVED FROM YOUR SURGEON'S OFFICE!!!   Oral Hygiene is also important to reduce your risk of infection.                                    Remember - BRUSH YOUR TEETH THE MORNING OF SURGERY WITH YOUR REGULAR TOOTHPASTE  DENTURES WILL BE REMOVED PRIOR TO SURGERY PLEASE DO NOT APPLY "Poly grip" OR ADHESIVES!!!   Do NOT smoke after Midnight   Take these medicines the morning of surgery with A SIP OF WATER: amiodarone,diltiazem,trimpex,levothyroxine.  How to Manage Your Diabetes Before and After Surgery  Why is it important to control my blood sugar before and after surgery? Improving blood sugar levels before and after surgery helps healing and can limit problems. A way of improving blood sugar control is eating a healthy diet by:  Eating less sugar and carbohydrates  Increasing activity/exercise  Talking with your doctor about reaching your blood sugar goals High blood sugars (greater than 180 mg/dL) can raise your risk  of infections and slow your recovery, so you will need to focus on controlling your diabetes during the weeks before surgery. Make sure that the doctor who takes care of your diabetes knows about your planned surgery including the date and location.  How do I manage my blood sugar before surgery? Check your blood sugar at least 4 times a day, starting 2 days before surgery, to make sure that the level is not too high or low. Check your blood sugar the morning of your surgery when you wake up and every 2 hours until you get to the Short Stay unit. If your blood sugar is less than 70 mg/dL, you will need to treat for low blood sugar: Do not take insulin. Treat a low blood sugar (less than 70 mg/dL) with  cup of clear juice (cranberry or apple), 4 glucose tablets, OR glucose gel. Recheck blood sugar in 15 minutes after treatment (to make sure it is greater than 70 mg/dL). If your blood sugar is not greater than 70 mg/dL on recheck, call [phone] for further instructions. Report your blood sugar to the short stay nurse when you get to Short Stay.  If you are admitted to the hospital after surgery: Your blood sugar will be checked by the staff and  you will probably be given insulin after surgery (instead of oral diabetes medicines) to make sure you have good blood sugar levels. The goal for blood sugar control after surgery is 80-180 mg/dL.   WHAT DO I DO ABOUT MY DIABETES MEDICATION?  Do not take oral diabetes medicines (pills) the morning of surgery.  THE NIGHT BEFORE SURGERY, take ONLY half of the glargine insulin ( 6 units). DO NOT take the dinner dose of lovenog insuline      THE MORNING OF SURGERY, If your CBG is greater than 220 mg/dL, you may take  of your sliding scale  (correction) dose of insulin Novolog).               You may not have any metal on your body including hair pins, jewelry, and body piercing             Do not wear make-up, lotions, powders, perfumes/cologne, or  deodorant  Do not wear nail polish including gel and S&S, artificial/acrylic nails, or any other type of covering on natural nails including finger and toenails. If you have artificial nails, gel coating, etc. that needs to be removed by a nail salon please have this removed prior to surgery or surgery may need to be canceled/ delayed if the surgeon/ anesthesia feels like they are unable to be safely monitored.   Do not shave  48 hours prior to surgery.    Do not bring valuables to the hospital. [place] IS NOT             RESPONSIBLE   FOR VALUABLES.   Contacts, glasses, or bridgework may not be worn into surgery.   Bring small overnight bag day of surgery.   DO NOT BRING YOUR HOME MEDICATIONS TO THE HOSPITAL. PHARMACY WILL DISPENSE MEDICATIONS LISTED ON YOUR MEDICATION LIST TO YOU DURING YOUR ADMISSION IN THE HOSPITAL!    Patients discharged on the day of surgery will not be allowed to drive home.  Someone NEEDS to stay with you for the first 24 hours after anesthesia.   Special Instructions: Bring a copy of your healthcare power of attorney and living will documents         the day of surgery if you haven't scanned them before.              Please read over the following fact sheets you were given: IF YOU HAVE QUESTIONS ABOUT YOUR PRE-OP INSTRUCTIONS PLEASE CALL [phone]    [facility] - Preparing for Surgery Before surgery, you can play an important role.  Because skin is not sterile, your skin needs to be as free of germs as possible.  You can reduce the number of germs on your skin by washing with CHG (chlorahexidine gluconate) soap before surgery.  CHG is an antiseptic cleaner which kills germs and bonds with the skin to continue killing germs even after washing. Please DO NOT use if you have an allergy to CHG or antibacterial soaps.  If your skin becomes reddened/irritated stop using the CHG and inform your nurse when you arrive at Short Stay. Do not shave (including legs and  underarms) for at least 48 hours prior to the first CHG shower.  You may shave your face/neck. Please follow these instructions carefully:  1.  Shower with CHG Soap the night before surgery and the  morning of Surgery.  2.  If you choose to wash your hair, wash your hair first as usual with your  normal  shampoo.  3.  After you shampoo, rinse your hair and body thoroughly to remove the  shampoo.                           4.  Use CHG as you would any other liquid soap.  You can apply chg directly  to the skin and wash                       Gently with a scrungie or clean washcloth.  5.  Apply the CHG Soap to your body ONLY FROM THE NECK DOWN.   Do not use on face/ open                           Wound or open sores. Avoid contact with eyes, ears mouth and genitals (private parts).                       Wash face,  Genitals (private parts) with your normal soap.             6.  Wash thoroughly, paying special attention to the area where your surgery  will be performed.  7.  Thoroughly rinse your body with warm water from the neck down.  8.  DO NOT shower/wash with your normal soap after using and rinsing off  the CHG Soap.                9.  Pat yourself dry with a clean towel.            10.  Wear clean pajamas.            11.  Place clean sheets on your bed the night of your first shower and do not  sleep with pets. Day of Surgery : Do not apply any lotions/deodorants the morning of surgery.  Please wear clean clothes to the hospital/surgery center.  FAILURE TO FOLLOW THESE INSTRUCTIONS MAY RESULT IN THE CANCELLATION OF YOUR SURGERY PATIENT SIGNATURE_________________________________  NURSE SIGNATURE__________________________________  ________________________________________________________________________

## 2024-01-13 NOTE — Telephone Encounter (Signed)
 Patient with diagnosis of A Fib on Eliquis for anticoagulation.    Procedure: Bladder Biopsy and possible resection of Tumor  Date of procedure:/ 01/23/24   CHA2DS2-VASc Score = 5  This indicates a 7.2% annual risk of stroke. The patient's score is based upon: CHF History: 0 HTN History: 1 Diabetes History: 1 Stroke History: 0 Vascular Disease History: 0 Age Score: 2 Gender Score: 1    CrCl 44 ml/min Platelet count 213K   Per office protocol, patient can hold Eliquis for 3 days prior to procedure.    **This guidance is not considered finalized until pre-operative APP has relayed final recommendations.**

## 2024-01-13 NOTE — Telephone Encounter (Signed)
   Name: [name]  DOB: 16-Oct-1934  MRN: [id number]  Primary Cardiologist: Chrystie Nose, MD   Preoperative team, please contact this patient and set up a phone call appointment for further preoperative risk assessment. Please obtain consent and complete medication review. Thank you for your help.  I confirm that guidance regarding antiplatelet and oral anticoagulation therapy has been completed and, if necessary, noted below.  Per Pharm D, patient may hold Eliquis for 3 days prior to procedure.    I also confirmed the patient resides in the state of [place]. As per [facility] Medical Board telemedicine laws, the patient must reside in the state in which the provider is licensed.   Carlos Levering, NP 01/13/2024, 8:43 AM [facility]

## 2024-01-16 NOTE — Telephone Encounter (Signed)
2nd attempt to reach pt to schedule tele pre op appt.

## 2024-01-17 ENCOUNTER — Telehealth: Payer: Self-pay | Admitting: *Deleted

## 2024-01-17 ENCOUNTER — Encounter (HOSPITAL_COMMUNITY): Payer: Self-pay

## 2024-01-17 ENCOUNTER — Encounter (HOSPITAL_COMMUNITY)
Admission: RE | Admit: 2024-01-17 | Discharge: 2024-01-17 | Disposition: A | Discharge status: Home / Self Care | Source: Ambulatory Visit | Attending: Urology | Admitting: Urology

## 2024-01-17 ENCOUNTER — Other Ambulatory Visit: Payer: Self-pay

## 2024-01-17 VITALS — BP 144/55 | HR 69 | Temp 98.2°F | Ht 62.0 in | Wt 127.0 lb

## 2024-01-17 DIAGNOSIS — Z794 Long term (current) use of insulin: Secondary | ICD-10-CM | POA: Diagnosis not present

## 2024-01-17 DIAGNOSIS — E119 Type 2 diabetes mellitus without complications: Secondary | ICD-10-CM | POA: Diagnosis not present

## 2024-01-17 DIAGNOSIS — I10 Essential (primary) hypertension: Secondary | ICD-10-CM | POA: Diagnosis not present

## 2024-01-17 DIAGNOSIS — Z01812 Encounter for preprocedural laboratory examination: Secondary | ICD-10-CM | POA: Insufficient documentation

## 2024-01-17 HISTORY — DX: Peripheral vascular disease, unspecified: I73.9

## 2024-01-17 LAB — CBC
HCT: 35.4 % — ABNORMAL LOW (ref 36.0–46.0)
Hemoglobin: 11 g/dL — ABNORMAL LOW (ref 12.0–15.0)
MCH: 27.5 pg (ref 26.0–34.0)
MCHC: 31.1 g/dL (ref 30.0–36.0)
MCV: 88.5 fL (ref 80.0–100.0)
Platelets: 287 10*3/uL (ref 150–400)
RBC: 4 MIL/uL (ref 3.87–5.11)
RDW: 14.6 % (ref 11.5–15.5)
WBC: 9.5 10*3/uL (ref 4.0–10.5)
nRBC: 0 % (ref 0.0–0.2)

## 2024-01-17 LAB — BASIC METABOLIC PANEL WITH GFR
Anion gap: 7 (ref 5–15)
BUN: 18 mg/dL (ref 8–23)
CO2: 28 mmol/L (ref 22–32)
Calcium: 9.5 mg/dL (ref 8.9–10.3)
Chloride: 97 mmol/L — ABNORMAL LOW (ref 98–111)
Creatinine, Ser: 1.08 mg/dL — ABNORMAL HIGH (ref 0.44–1.00)
GFR, Estimated: 50 mL/min — ABNORMAL LOW (ref >60)
Glucose, Bld: 103 mg/dL — ABNORMAL HIGH (ref 70–99)
Potassium: 4.5 mmol/L (ref 3.5–5.1)
Sodium: 132 mmol/L — ABNORMAL LOW (ref 135–145)

## 2024-01-17 LAB — GLUCOSE, CAPILLARY: Glucose-Capillary: 103 mg/dL — ABNORMAL HIGH (ref 70–99)

## 2024-01-17 NOTE — Progress Notes (Addendum)
 For Anesthesia: PCP - [name], MD  Cardiologist - Chrystie Nose, MD  Clearance: Pending appointment on: 01/19/24 Bowel Prep reminder:  Chest x-ray - 09/16/23 EKG - 09/26/23 Stress Test -  ECHO - 09/01/21 Cardiac Cath -  Pacemaker/ICD device last checked: Pacemaker orders received: Device Rep notified:  Spinal Cord Stimulator:  Sleep Study - N/A CPAP -   Fasting Blood Sugar - 140's Checks Blood Sugar : continuous libre 1 monitor Date and result of last Hgb A1c- 10.2: 12/23/23  Last dose of GLP1 agonist- N/A GLP1 instructions:   Last dose of SGLT-2 inhibitors- N/A SGLT-2 instructions:   Blood Thinner Instructions: Eliquis will be on hold after: 01/19/24 Aspirin Instructions: Last Dose:  Activity level: Can go up a flight of stairs and activities of daily living without stopping and without chest pain and/or shortness of breath   Able to exercise without chest pain and/or shortness of breath  Anesthesia review: Hx: DIA type 1,Murmur,HTN,Afib,CKD III  Patient denies shortness of breath, fever, cough and chest pain at PAT appointment   Patient verbalized understanding of instructions that were given to them at the PAT appointment. Patient was also instructed that they will need to review over the PAT instructions again at home before surgery.

## 2024-01-17 NOTE — Telephone Encounter (Signed)
 Pt has been scheduled tele preop appt 01/19/24. Med rec and consent are done.      Patient Consent for Virtual Visit        [name] has provided verbal consent on 01/17/2024 for a virtual visit (video or telephone).   CONSENT FOR VIRTUAL VISIT FOR:  [name]  By participating in this virtual visit I agree to the following:  I hereby voluntarily request, consent and authorize [facility] and its employed or contracted physicians, physician assistants, nurse practitioners or other licensed health care professionals (the Practitioner), to provide me with telemedicine health care services (the "Services") as deemed necessary by the treating Practitioner. I acknowledge and consent to receive the Services by the Practitioner via telemedicine. I understand that the telemedicine visit will involve communicating with the Practitioner through live audiovisual communication technology and the disclosure of certain medical information by electronic transmission. I acknowledge that I have been given the opportunity to request an in-person assessment or other available alternative prior to the telemedicine visit and am voluntarily participating in the telemedicine visit.  I understand that I have the right to withhold or withdraw my consent to the use of telemedicine in the course of my care at any time, without affecting my right to future care or treatment, and that the Practitioner or I may terminate the telemedicine visit at any time. I understand that I have the right to inspect all information obtained and/or recorded in the course of the telemedicine visit and may receive copies of available information for a reasonable fee.  I understand that some of the potential risks of receiving the Services via telemedicine include:  Delay or interruption in medical evaluation due to technological equipment failure or disruption; Information transmitted may not be sufficient (e.g. poor  resolution of images) to allow for appropriate medical decision making by the Practitioner; and/or  In rare instances, security protocols could fail, causing a breach of personal health information.  Furthermore, I acknowledge that it is my responsibility to provide information about my medical history, conditions and care that is complete and accurate to the best of my ability. I acknowledge that Practitioner's advice, recommendations, and/or decision may be based on factors not within their control, such as incomplete or inaccurate data provided by me or distortions of diagnostic images or specimens that may result from electronic transmissions. I understand that the practice of medicine is not an exact science and that Practitioner makes no warranties or guarantees regarding treatment outcomes. I acknowledge that a copy of this consent can be made available to me via my patient portal [facility] MyChart), or I can request a printed copy by calling the office of [facility].    I understand that my insurance will be billed for this visit.   I have read or had this consent read to me. I understand the contents of this consent, which adequately explains the benefits and risks of the Services being provided via telemedicine.  I have been provided ample opportunity to ask questions regarding this consent and the Services and have had my questions answered to my satisfaction. I give my informed consent for the services to be provided through the use of telemedicine in my medical care

## 2024-01-17 NOTE — Telephone Encounter (Signed)
 Pt has been scheduled tele preop appt 01/19/24. Med rec and consent are done.

## 2024-01-19 ENCOUNTER — Ambulatory Visit: Attending: Cardiovascular Disease

## 2024-01-19 DIAGNOSIS — Z01810 Encounter for preprocedural cardiovascular examination: Secondary | ICD-10-CM | POA: Diagnosis not present

## 2024-01-19 NOTE — Progress Notes (Signed)
 Virtual Visit via Telephone Note   Because of [name] co-morbid illnesses, she is at least at moderate risk for complications without adequate follow up.  This format is felt to be most appropriate for this patient at this time.  Due to technical limitations with video connection (technology), today's appointment will be conducted as an audio only telehealth visit, and [name] verbally agreed to proceed in this manner.   All issues noted in this document were discussed and addressed.  No physical exam could be performed with this format.  Evaluation Performed:  Preoperative cardiovascular risk assessment _____________   Date:  01/19/2024   Patient ID:  Karen, Nichols 1935-03-18, MRN [id number] Patient Location:  Home Provider location:   Office  Primary Care Provider:  [name], MD Primary Cardiologist:  Chrystie Nose, MD  Chief Complaint / Patient Profile   89 y.o. y/o female with a h/o AF (on Eliquis), HTN, HLD, DM type I, PVD who is pending cystoscopy/bladder biopsy and presents today for telephonic preoperative cardiovascular risk assessment.  History of Present Illness    [name] is a 89 y.o. female who presents via audio/video conferencing for a telehealth visit today.  Karen was last seen in cardiology clinic on 06/15/2023 by Carlos Levering, NP.  At that time [name] was doing well with improvement to weakness but noted increase edema in feet and ankles and BP was stable.  She was seen in the hospital and treated for UTI following previous bladder biopsy and was seen in the ED in February for constipation.  The patient is now pending procedure as outlined above. Since her last visit, she has been doing well with no new cardiac complaints.  She is very active and does all of her ADLs independently.  She denies chest pain, shortness of breath, lower extremity edema, fatigue, palpitations, melena, hematuria, hemoptysis,  diaphoresis, weakness, presyncope, syncope, orthopnea, and PND.    Past Medical History    Past Medical History:  Diagnosis Date   Anemia    Arthritis    Colon polyp    Diabetes mellitus    type 1   Heart murmur    Hyperlipidemia    Hypertension    Hypothyroidism    Peripheral vascular disease (HCC)    Pneumonia    Poor appetite    Thyroid disease    Past Surgical History:  Procedure Laterality Date   ABDOMINAL HYSTERECTOMY     APPENDECTOMY     BIOPSY  02/07/2022   Procedure: BIOPSY;  Surgeon: Meryl Dare, MD;  Location: [facility] ENDOSCOPY;  Service: Gastroenterology;;   BREAST EXCISIONAL BIOPSY Right    CARDIOVASCULAR STRESS TEST  07/15/2006   Normal scan, no ECG changes   COLONOSCOPY WITH PROPOFOL N/A 02/07/2022   Procedure: COLONOSCOPY WITH PROPOFOL;  Surgeon: Meryl Dare, MD;  Location: [facility] ENDOSCOPY;  Service: Gastroenterology;  Laterality: N/A;   CYSTOSCOPY WITH BIOPSY Bilateral 11/19/2022   Procedure: CYSTOSCOPY WITH TRANSURETHRAL RESECTION OF BLADDER TUMOR BILATERAL RETROGRADE PYELOGRAM;  Surgeon: Crista Elliot, MD;  Location: [facility] ORS;  Service: Urology;  Laterality: Bilateral;  1 HR FOR CASE   CYSTOSCOPY WITH BIOPSY Bilateral 09/12/2023   Procedure: CYSTOSCOPY WITH BIOPSY FULGURATION  BILATERAL RETROGRADE PYELOGRAM, BILATERAL DIAGNOSTIC URETEROSCOPY;  Surgeon: Crista Elliot, MD;  Location: [facility] ORS;  Service: Urology;  Laterality: Bilateral;   ESOPHAGOGASTRODUODENOSCOPY (EGD) WITH PROPOFOL N/A 02/07/2022   Procedure: ESOPHAGOGASTRODUODENOSCOPY (EGD) WITH PROPOFOL;  Surgeon: Russella Dar,  Venita Lick, MD;  Location: [facility] ENDOSCOPY;  Service: Gastroenterology;  Laterality: N/A;   TRANSTHORACIC ECHOCARDIOGRAM  01/08/2011   EF 60-65%, moderae mitral regurg, LA mild-moderately dilated,   TRANSURETHRAL RESECTION OF BLADDER TUMOR N/A 04/04/2023   Procedure: TRANSURETHRAL RESECTION OF BLADDER TUMOR (TURBT);  Surgeon: Crista Elliot, MD;  Location: [facility] ORS;  Service: Urology;   Laterality: N/A;  1 HR FOR CASE    Allergies  No Known Allergies  Home Medications    Prior to Admission medications   Medication Sig Start Date End Date Taking? Authorizing Provider  acetaminophen (TYLENOL) 500 MG tablet Take 500 mg by mouth every 6 (six) hours as needed for moderate pain (pain score 4-6).    [provider]  amiodarone (PACERONE) 100 MG tablet Take 1 tablet (100 mg total) by mouth daily. Patient taking differently: Take 100 mg by mouth in the morning. 12/15/23   Hilty, Lisette Abu, MD  Ascorbic Acid (VITAMIN C) 100 MG tablet Take 100 mg by mouth daily.    [provider]  diltiazem (TIADYLT ER) 180 MG 24 hr capsule TAKE 1 CAPSULE BY MOUTH EVERY DAY Patient taking differently: Take 180 mg by mouth in the morning. 12/15/23   Hilty, Lisette Abu, MD  docusate sodium (COLACE) 100 MG capsule Take 1 capsule (100 mg total) by mouth every 12 (twelve) hours. Patient taking differently: Take 100 mg by mouth daily. 11/27/23   Garrison, [place] N, FNP  ELIQUIS 2.5 MG TABS tablet TAKE 1 TABLET BY MOUTH TWICE A DAY 11/09/23   Hilty, Lisette Abu, MD  furosemide (LASIX) 20 MG tablet TAKE 1 TABLET (20 MG TOTAL) BY MOUTH DAILY AS NEEDED FOR FLUID OR EDEMA. 10/13/23   Carlos Levering, NP  insulin aspart (NOVOLOG FLEXPEN) 100 UNIT/ML FlexPen Inject 8 Units into the skin 3 (three) times daily with meals. Patient taking differently: Inject 5 Units into the skin See admin instructions. If BG is >220 08/04/23   Marinda Elk, MD  insulin glargine, 2 Unit Dial, (TOUJEO MAX SOLOSTAR) 300 UNIT/ML Solostar Pen Inject 14 Units into the skin at bedtime. Patient taking differently: Inject 12 Units into the skin at bedtime. 08/04/23   Marinda Elk, MD  levothyroxine (SYNTHROID) 112 MCG tablet Take 112 mcg by mouth daily before breakfast.    [provider]  mirtazapine (REMERON) 30 MG tablet Take 30 mg by mouth at bedtime as needed (sleep).    [provider]   pantoprazole (PROTONIX) 40 MG tablet Take 40 mg by mouth daily. 12/30/23   [provider]  trimethoprim (TRIMPEX) 100 MG tablet Take 100 mg by mouth in the morning. 10/27/22   [provider]    Physical Exam    Vital Signs:  [name] does not have vital signs available for review today. B/P-120/80  Given telephonic nature of communication, physical exam is limited. AAOx3. NAD. Normal affect.  Speech and respirations are unlabored.  Accessory Clinical Findings    None  Assessment & Plan    1.  Preoperative Cardiovascular Risk Assessment: -Patient's RCRI score is 6.6%  The patient affirms she has been doing well without any new cardiac symptoms. They are able to achieve 5 METS without cardiac limitations. Therefore, based on ACC/AHA guidelines, the patient would be at acceptable risk for the planned procedure without further cardiovascular testing. The patient was advised that if she develops new symptoms prior to surgery to contact our office to arrange for a follow-up visit, and she  verbalized understanding.   Patient can hold Eliquis 3 days prior to procedure and should restart postprocedure when surgically safe and hemostasis is achieved.   The patient was advised that if she develops new symptoms prior to surgery to contact our office to arrange for a follow-up visit, and she verbalized understanding.  A copy of this note will be routed to requesting surgeon.  Time:   Today, I have spent 6 minutes with the patient with telehealth technology discussing medical history, symptoms, and management plan.     Napoleon Form, Leodis Rains, NP  01/19/2024, 7:14 AM

## 2024-01-23 ENCOUNTER — Ambulatory Visit (HOSPITAL_COMMUNITY)
Admission: RE | Admit: 2024-01-23 | Discharge: 2024-01-23 | Disposition: A | Discharge status: Home / Self Care | Source: Ambulatory Visit | Attending: Urology | Admitting: Urology

## 2024-01-23 ENCOUNTER — Encounter (HOSPITAL_COMMUNITY): Payer: Self-pay | Admitting: Urology

## 2024-01-23 ENCOUNTER — Other Ambulatory Visit: Payer: Self-pay

## 2024-01-23 ENCOUNTER — Ambulatory Visit (HOSPITAL_BASED_OUTPATIENT_CLINIC_OR_DEPARTMENT_OTHER): Admitting: Anesthesiology

## 2024-01-23 ENCOUNTER — Ambulatory Visit (HOSPITAL_COMMUNITY): Payer: Self-pay | Admitting: Physician Assistant

## 2024-01-23 ENCOUNTER — Encounter (HOSPITAL_COMMUNITY)
Admission: RE | Disposition: A | Discharge status: Home / Self Care | Payer: Self-pay | Source: Ambulatory Visit | Attending: Urology

## 2024-01-23 DIAGNOSIS — N3020 Other chronic cystitis without hematuria: Secondary | ICD-10-CM | POA: Diagnosis not present

## 2024-01-23 DIAGNOSIS — Z7901 Long term (current) use of anticoagulants: Secondary | ICD-10-CM | POA: Insufficient documentation

## 2024-01-23 DIAGNOSIS — N1831 Chronic kidney disease, stage 3a: Secondary | ICD-10-CM | POA: Diagnosis not present

## 2024-01-23 DIAGNOSIS — I4891 Unspecified atrial fibrillation: Secondary | ICD-10-CM | POA: Insufficient documentation

## 2024-01-23 DIAGNOSIS — D494 Neoplasm of unspecified behavior of bladder: Secondary | ICD-10-CM | POA: Diagnosis not present

## 2024-01-23 DIAGNOSIS — N3281 Overactive bladder: Secondary | ICD-10-CM | POA: Insufficient documentation

## 2024-01-23 DIAGNOSIS — N3021 Other chronic cystitis with hematuria: Secondary | ICD-10-CM | POA: Diagnosis not present

## 2024-01-23 DIAGNOSIS — Z87891 Personal history of nicotine dependence: Secondary | ICD-10-CM

## 2024-01-23 DIAGNOSIS — E039 Hypothyroidism, unspecified: Secondary | ICD-10-CM | POA: Insufficient documentation

## 2024-01-23 DIAGNOSIS — K279 Peptic ulcer, site unspecified, unspecified as acute or chronic, without hemorrhage or perforation: Secondary | ICD-10-CM | POA: Diagnosis not present

## 2024-01-23 DIAGNOSIS — N329 Bladder disorder, unspecified: Secondary | ICD-10-CM | POA: Diagnosis not present

## 2024-01-23 DIAGNOSIS — R350 Frequency of micturition: Secondary | ICD-10-CM | POA: Diagnosis not present

## 2024-01-23 DIAGNOSIS — R351 Nocturia: Secondary | ICD-10-CM | POA: Insufficient documentation

## 2024-01-23 DIAGNOSIS — Z87440 Personal history of urinary (tract) infections: Secondary | ICD-10-CM | POA: Insufficient documentation

## 2024-01-23 DIAGNOSIS — M1990 Unspecified osteoarthritis, unspecified site: Secondary | ICD-10-CM | POA: Insufficient documentation

## 2024-01-23 DIAGNOSIS — Z8551 Personal history of malignant neoplasm of bladder: Secondary | ICD-10-CM | POA: Insufficient documentation

## 2024-01-23 DIAGNOSIS — D649 Anemia, unspecified: Secondary | ICD-10-CM | POA: Diagnosis not present

## 2024-01-23 DIAGNOSIS — N3941 Urge incontinence: Secondary | ICD-10-CM | POA: Diagnosis not present

## 2024-01-23 DIAGNOSIS — Z794 Long term (current) use of insulin: Secondary | ICD-10-CM | POA: Insufficient documentation

## 2024-01-23 DIAGNOSIS — I10 Essential (primary) hypertension: Secondary | ICD-10-CM | POA: Diagnosis not present

## 2024-01-23 DIAGNOSIS — E1151 Type 2 diabetes mellitus with diabetic peripheral angiopathy without gangrene: Secondary | ICD-10-CM | POA: Insufficient documentation

## 2024-01-23 DIAGNOSIS — I129 Hypertensive chronic kidney disease with stage 1 through stage 4 chronic kidney disease, or unspecified chronic kidney disease: Secondary | ICD-10-CM | POA: Diagnosis not present

## 2024-01-23 HISTORY — PX: TRANSURETHRAL RESECTION OF BLADDER TUMOR: SHX2575

## 2024-01-23 HISTORY — PX: CYSTOSCOPY WITH FULGERATION: SHX6638

## 2024-01-23 LAB — GLUCOSE, CAPILLARY
Glucose-Capillary: 239 mg/dL — ABNORMAL HIGH (ref 70–99)
Glucose-Capillary: 370 mg/dL — ABNORMAL HIGH (ref 70–99)
Glucose-Capillary: 176 mg/dL — ABNORMAL HIGH (ref 70–99)
Glucose-Capillary: 199 mg/dL — ABNORMAL HIGH (ref 70–99)
Glucose-Capillary: 92 mg/dL (ref 70–99)
Glucose-Capillary: 246 mg/dL — ABNORMAL HIGH (ref 70–99)

## 2024-01-23 SURGERY — CYSTOSCOPY, WITH BLADDER FULGURATION
Anesthesia: General

## 2024-01-23 MED ORDER — DEXTROSE 50 % IV SOLN
12.5000 g | Freq: Once | INTRAVENOUS | Status: AC
  Administered 2024-01-23: 12.5 g via INTRAVENOUS
  Filled 2024-01-23: qty 50

## 2024-01-23 MED ORDER — ROCURONIUM BROMIDE 100 MG/10ML IV SOLN
INTRAVENOUS | Status: DC | PRN
  Administered 2024-01-23: 30 mg via INTRAVENOUS

## 2024-01-23 MED ORDER — LIDOCAINE HCL (PF) 2 % IJ SOLN
INTRAMUSCULAR | Status: AC
  Filled 2024-01-23: qty 5

## 2024-01-23 MED ORDER — SUGAMMADEX SODIUM 200 MG/2ML IV SOLN
INTRAVENOUS | Status: DC | PRN
  Administered 2024-01-23: 200 mg via INTRAVENOUS

## 2024-01-23 MED ORDER — PROPOFOL 10 MG/ML IV BOLUS
INTRAVENOUS | Status: AC
  Filled 2024-01-23: qty 20

## 2024-01-23 MED ORDER — FENTANYL CITRATE (PF) 100 MCG/2ML IJ SOLN
INTRAMUSCULAR | Status: DC | PRN
  Administered 2024-01-23: 100 ug via INTRAVENOUS

## 2024-01-23 MED ORDER — FENTANYL CITRATE (PF) 100 MCG/2ML IJ SOLN
INTRAMUSCULAR | Status: AC
  Filled 2024-01-23: qty 2

## 2024-01-23 MED ORDER — INSULIN ASPART 100 UNIT/ML IJ SOLN
10.0000 [IU] | Freq: Once | INTRAMUSCULAR | Status: AC
  Administered 2024-01-23: 10 [IU] via SUBCUTANEOUS
  Filled 2024-01-23: qty 1

## 2024-01-23 MED ORDER — STERILE WATER FOR IRRIGATION IR SOLN
Status: DC | PRN
  Administered 2024-01-23: 1000 mL

## 2024-01-23 MED ORDER — AMISULPRIDE (ANTIEMETIC) 5 MG/2ML IV SOLN: 10.0000 mg | Freq: Once | INTRAVENOUS | Status: DC | PRN

## 2024-01-23 MED ORDER — INSULIN ASPART 100 UNIT/ML IJ SOLN
INTRAMUSCULAR | Status: AC
  Filled 2024-01-23: qty 1

## 2024-01-23 MED ORDER — LABETALOL HCL 5 MG/ML IV SOLN
INTRAVENOUS | Status: AC
  Filled 2024-01-23: qty 4

## 2024-01-23 MED ORDER — PROPOFOL 10 MG/ML IV BOLUS
INTRAVENOUS | Status: DC | PRN
Start: 2024-01-23 — End: 2024-01-23
  Administered 2024-01-23: 100 mg via INTRAVENOUS

## 2024-01-23 MED ORDER — FENTANYL CITRATE PF 50 MCG/ML IJ SOSY: 25.0000 ug | PREFILLED_SYRINGE | INTRAMUSCULAR | Status: DC | PRN

## 2024-01-23 MED ORDER — ESMOLOL HCL 100 MG/10ML IV SOLN
INTRAVENOUS | Status: AC
Start: 2024-01-23 — End: ?
  Filled 2024-01-23: qty 10

## 2024-01-23 MED ORDER — ORAL CARE MOUTH RINSE: 15.0000 mL | Freq: Once | OROMUCOSAL | Status: AC

## 2024-01-23 MED ORDER — ONDANSETRON HCL 4 MG/2ML IJ SOLN
INTRAMUSCULAR | Status: DC | PRN
Start: 2024-01-23 — End: 2024-01-23
  Administered 2024-01-23: 4 mg via INTRAVENOUS

## 2024-01-23 MED ORDER — ONDANSETRON HCL 4 MG/2ML IJ SOLN
INTRAMUSCULAR | Status: AC
  Filled 2024-01-23: qty 2

## 2024-01-23 MED ORDER — LACTATED RINGERS IV SOLN: INTRAVENOUS | Status: DC

## 2024-01-23 MED ORDER — ESMOLOL HCL 100 MG/10ML IV SOLN
INTRAVENOUS | Status: DC | PRN
  Administered 2024-01-23: 60 mg via INTRAVENOUS
  Administered 2024-01-23 (×2): 20 mg via INTRAVENOUS

## 2024-01-23 MED ORDER — LIDOCAINE HCL (CARDIAC) PF 100 MG/5ML IV SOSY
PREFILLED_SYRINGE | INTRAVENOUS | Status: DC | PRN
  Administered 2024-01-23: 60 mg via INTRAVENOUS

## 2024-01-23 MED ORDER — SODIUM CHLORIDE 0.9 % IV SOLN
2.0000 g | INTRAVENOUS | Status: AC
  Administered 2024-01-23: 2 g via INTRAVENOUS
  Filled 2024-01-23: qty 20

## 2024-01-23 MED ORDER — ACETAMINOPHEN 10 MG/ML IV SOLN: 1000.0000 mg | Freq: Once | INTRAVENOUS | Status: DC | PRN

## 2024-01-23 MED ORDER — LABETALOL HCL 5 MG/ML IV SOLN
INTRAVENOUS | Status: DC | PRN
  Administered 2024-01-23: 5 mg via INTRAVENOUS

## 2024-01-23 MED ORDER — ROCURONIUM BROMIDE 10 MG/ML (PF) SYRINGE
PREFILLED_SYRINGE | INTRAVENOUS | Status: AC
  Filled 2024-01-23: qty 10

## 2024-01-23 MED ORDER — INSULIN ASPART 100 UNIT/ML IJ SOLN
0.0000 [IU] | INTRAMUSCULAR | Status: AC | PRN
  Administered 2024-01-23: 3 [IU] via SUBCUTANEOUS

## 2024-01-23 MED ORDER — CHLORHEXIDINE GLUCONATE 0.12 % MT SOLN
15.0000 mL | Freq: Once | OROMUCOSAL | Status: AC
  Administered 2024-01-23: 15 mL via OROMUCOSAL

## 2024-01-23 MED ORDER — ONDANSETRON HCL 4 MG/2ML IJ SOLN: 4.0000 mg | Freq: Once | INTRAMUSCULAR | Status: DC | PRN

## 2024-01-23 SURGICAL SUPPLY — 16 items
BAG URINE DRAIN 2000ML AR STRL (UROLOGICAL SUPPLIES) IMPLANT
BAG URO CATCHER STRL LF (MISCELLANEOUS) ×2 IMPLANT
CATH FOLEY 2WAY SLVR 5CC 18FR (CATHETERS) IMPLANT
DRAPE FOOT SWITCH (DRAPES) ×2 IMPLANT
ELECT REM PT RETURN 15FT ADLT (MISCELLANEOUS) ×2 IMPLANT
GLOVE BIO SURGEON STRL SZ7.5 (GLOVE) ×2 IMPLANT
GOWN STRL REUS W/ TWL XL LVL3 (GOWN DISPOSABLE) ×2 IMPLANT
KIT TURNOVER KIT A (KITS) IMPLANT
LOOP CUT BIPOLAR 24F LRG (ELECTROSURGICAL) IMPLANT
MANIFOLD NEPTUNE II (INSTRUMENTS) ×2 IMPLANT
PACK CYSTO (CUSTOM PROCEDURE TRAY) ×2 IMPLANT
PLUG CATH AND CAP STRL 200 (CATHETERS) IMPLANT
SYR TOOMEY IRRIG 70ML (MISCELLANEOUS) IMPLANT
SYRINGE TOOMEY IRRIG 70ML (MISCELLANEOUS) IMPLANT
TUBING CONNECTING 10 (TUBING) ×2 IMPLANT
TUBING UROLOGY SET (TUBING) ×2 IMPLANT

## 2024-01-23 NOTE — Anesthesia Postprocedure Evaluation (Signed)
 Anesthesia Post Note  Patient: [name] General  Procedure(s) Performed: CYSTOSCOPY, WITH BLADDER FULGURATION TURBT (TRANSURETHRAL RESECTION OF BLADDER TUMOR)     Patient location during evaluation: PACU Anesthesia Type: General Level of consciousness: awake Pain management: pain level controlled Vital Signs Assessment: post-procedure vital signs reviewed and stable Respiratory status: spontaneous breathing, nonlabored ventilation and respiratory function stable Cardiovascular status: blood pressure returned to baseline and stable Postop Assessment: no apparent nausea or vomiting Anesthetic complications: no   No notable events documented.  Last Vitals:  Vitals:   01/23/24 1615 01/23/24 1645  BP: (!) 169/79 (!) 153/68  Pulse: 71 70  Resp: (!) 29 18  Temp:  36.4 C  SpO2: 92% 93%    Last Pain:  Vitals:   01/23/24 1645  TempSrc:   PainSc: 0-No pain                 Kari Kerth P Jacksyn Beeks

## 2024-01-23 NOTE — Discharge Instructions (Addendum)
 Transurethral Resection of Bladder Tumor (TURBT) or Bladder Biopsy  You may restart your Eliquis on Wednesday morning as long as you are not having any bleeding  Definition:  Transurethral Resection of the Bladder Tumor is a surgical procedure used to diagnose and remove tumors within the bladder. TURBT is the most common treatment for early stage bladder cancer.  General instructions:     Your recent bladder surgery requires very little post hospital care but some definite precautions.  Despite the fact that no skin incisions were used, the area around the bladder incisions are raw and covered with scabs to promote healing and prevent bleeding. Certain precautions are needed to insure that the scabs are not disturbed over the next 2-4 weeks while the healing proceeds.  Because the raw surface inside your bladder and the irritating effects of urine you may expect frequency of urination and/or urgency (a stronger desire to urinate) and perhaps even getting up at night more often. This will usually resolve or improve slowly over the healing period. You may see some blood in your urine over the first 6 weeks. Do not be alarmed, even if the urine was clear for a while. Get off your feet and drink lots of fluids until clearing occurs. If you start to pass clots or don't improve call us .  Diet:  You may return to your normal diet immediately. Because of the raw surface of your bladder, alcohol, spicy foods, foods high in acid and drinks with caffeine may cause irritation or frequency and should be used in moderation. To keep your urine flowing freely and avoid constipation, drink plenty of fluids during the day (8-10 glasses). Tip: Avoid cranberry juice because it is very acidic.  Activity:  Your physical activity doesn't need to be restricted. However, if you are very active, you may see some blood in the urine. We suggest that you reduce your activity under the circumstances until the bleeding has  stopped.  Bowels:  It is important to keep your bowels regular during the postoperative period. Straining with bowel movements can cause bleeding. A bowel movement every other day is reasonable. Use a mild laxative if needed, such as milk of magnesia 2-3 tablespoons, or 2 Dulcolax tablets. Call if you continue to have problems. If you had been taking narcotics for pain, before, during or after your surgery, you may be constipated. Take a laxative if necessary.    Medication:  You should resume your pre-surgery medications unless told not to. In addition you may be given an antibiotic to prevent or treat infection. Antibiotics are not always necessary. All medication should be taken as prescribed until the bottles are finished unless you are having an unusual reaction to one of the drugs.

## 2024-01-23 NOTE — Transfer of Care (Signed)
 Immediate Anesthesia Transfer of Care Note  Patient: [name]  Procedure(s) Performed: CYSTOSCOPY, WITH BLADDER FULGURATION TURBT (TRANSURETHRAL RESECTION OF BLADDER TUMOR)  Patient Location: PACU  Anesthesia Type:General  Level of Consciousness: awake, alert , and oriented  Airway & Oxygen Therapy: Patient Spontanous Breathing and Patient connected to face mask oxygen  Post-op Assessment: Report given to RN and Post -op Vital signs reviewed and stable  Post vital signs: Reviewed and stable  Last Vitals:  Vitals Value Taken Time  BP 169/75 01/23/24 1536  Temp    Pulse 70 01/23/24 1543  Resp 15 01/23/24 1543  SpO2 95 % 01/23/24 1543  Vitals shown include unfiled device data.  Last Pain:  Vitals:   01/23/24 0921  TempSrc:   PainSc: 0-No pain         Complications: No notable events documented.

## 2024-01-23 NOTE — H&P (Signed)
 /HPI: I was consulted to assist the patient's microscopic hematuria. She smoked 30 years ago. She is on Eliquis but no aspirin   She has urge incontinence and sometimes wears 2 or less pads a day. No bedwetting or stress incontinence. She voids every 2 hours gets up 5 or 6 times a night with a good flow   She is an insulin-dependent diabetic. She has had a hysterectomy.   Patient has microscopic hematuria. She has urge incontinence frequency and significant nocturia. return with CT scan and for cystoscopy deferred for insurance reasons. Call if urine culture positive. I spent many minutes repeating the work-up of microscopic hematuria and explaining the differential diagnosis. The conversation was quite circular. I am not certain how else I could explain things better. She also would like to have her frequency day and night and urgency helped and we will discuss this next time. She understands that her urinalysis today was negative but if if its been positive in the past that should not be ignored especially in someone with a smoking history   Patient complains of a little bit of burning since some sort of biopsy many months ago in the hospital. The last hospital note was for poorly controlled diabetes. She had a positive urine culture in April 2023. CT scan demonstrated pancreatic abnormality discussed with patient. The CT scan will be sent to primary care and flag. The CT scan also demonstrated cystitis.   Urine positive   On cystoscopy patient had diffuse erythema. It was in patches. There is a few white flecks. Urine was not that cloudy. Urine was sent for culture. Pictures were taken and kept   The patient likely has chronic cystitis based upon cystoscopic findings, CT scan and at least one positive culture in the last several months. She does have a smoking history. I called in ciprofloxacin 250 mg twice a day for 7 days. I will then start her on trimethoprim 100 mg 30x11. I will repeat her  cystoscopy in 6 weeks. If she still has erythema she understands that she would need a biopsy from one of my partners and she is aware of this.. I am hoping that once a day antibiotic will help down regulate her overactive bladder. Call if culture differs   Based upon the mucosal enhancement and CT findings and degree of erythema today the cystitis may still persist at 6 weeks. Having said that I will recommend biopsy if it is still present to rule out carcinoma in situ   Send note with CT scan report to primary care about pancreas   Patient has not been taking her trimethoprim. She states she was having less burning on it but having a little bit of burning today at the end of urination only. I did not want to get a false positive. I sent the urine for culture and put her back on trimethoprim. Repeat cystoscopy in about 6 weeks. Call if culture is positive   I have a picture of the last cystoscopic findings. Last culture was negative   Co-pay for visit will be returned. She states she was going less frequently day and night on the trimethoprim.   Today  Frequency stable. Last culture negative.  Patient says she has little bit of burning at the end of urination but overall improved on trimethoprim.  On cystoscopy patient had diffuse erythema. She had white flecks in the urine. Some of the mucosa almost looked edematous. She had the urinate quite soon upon bladder filling.  The findings were in keeping with chronic cystitis and it was very diffuse  2 more pictures were taken and will be placed in the medical record   Picture was drawn. Patient understands she likely has chronic cystitis and that a biopsy would demonstrate inflammation. The differential diagnosis of carcinoma in situ was discussed. The role of referral for cystoscopy and biopsy was discussed. I want her to stay on trimethoprim.   I think she is willing to proceed with a bladder biopsy for safety reasons. She may be on a blood thinner  but it was not written in her medical record. I will see her in 6 months on trimethoprim. She understands a bladder biopsy will not change the natural history of her burning at the end of urination which she tolerates well. Call if culture positive   10/13/2022  Patient with a history of chronic cystitis. Has persistent erythema on cystoscopy and was referred to me for bladder biopsy. She denies any gross hematuria or dysuria. She maintains on trimethoprim UTI prophylaxis.   11/26/2022  Patient status post transurethral resection of bladder tumor with bilateral retrograde pyelogram. She had extensive erythema throughout the bladder which was resected and/or fulgurated. 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   03/09/2023  Patient did well with BCG. She presents for surveillance cystoscopy.   04/15/2023  Patient underwent a TURBT. This revealed no evidence of malignancy. Revealed inflammation. She is doing well.   07/20/2023  Patient presents today for surveillance cystoscopy. She went to the hospital on 10/1 for progressive weakness. Was treated for UTI with Keflex. Urine culture showed mixed growth. She denies any hematuria or dysuria. Unable to leave urine sample today.   09/21/2023  Patient status post bladder biopsy and fulguration. This revealed CIS. This is a recurrence despite BCG. Bilateral retrograde pyelogram showed some potential di distal filling defects bilaterally in the distal ureters but ureteroscopy confirmed that there was no cancer in the ureters. She has been very weak and has been slow to recover from the surgery but slowly improving day by day. Still having a lot of weakness.   01/11/2024  Patient presents today for surveillance cystoscopy. Completed BCG     ALLERGIES: No Allergies    MEDICATIONS: Levothyroxine Sodium 88 MCG Tablet  Nitrofurantoin Monohyd Macro 100 MG Capsule 1 capsule PO Q HS  Trimethoprim 100 MG Tablet 1 tablet PO  Daily  Amiodarone HCl 200 MG Tablet  Eliquis 2.5 MG Tablet  Klor-Con  Novolog  Pantoprazole Sodium 40 MG Tablet Delayed Release  Tiadylt ER 180 MG Capsule Extended Release 24 Hour  Trulicity     Notes:  .   GU PSH: Bladder Instill AntiCA Agent - 12/07/2023, 11/29/2023, 11/15/2023, 11/08/2023, 11/01/2023, 10/25/2023, 02/24/2023, 02/17/2023, 02/02/2023, 01/26/2023, 01/05/2023, 12/29/2022, 12/21/2022 Cystoscopy - 07/20/2023, 03/09/2023, 09/22/2022, 06/16/2022 Cystoscopy TURBT >5 cm - 11/19/2022 Locm 300-399Mg /Ml Iodine,1Ml - 05/18/2022       PSH Notes: Gall Bladder surgery   NON-GU PSH: Hysterectomy Visit Complexity (formerly GPC1X) - 09/21/2023, 04/15/2023, 03/30/2023     GU PMH: Bladder Cancer overlapping sites - 12/07/2023, - 11/29/2023, - 11/22/2023, - 11/15/2023, - 11/08/2023, - 11/01/2023, - 10/25/2023, - 09/21/2023, - 07/20/2023, - 04/15/2023, - 03/30/2023, - 03/09/2023, - 02/24/2023, - 02/17/2023, - 02/02/2023, - 01/26/2023, - 01/12/2023, - 01/05/2023, - 12/29/2022, - 12/21/2022, - 11/26/2022 CIS of the bladder - 09/21/2023, - 07/20/2023, - 03/09/2023, - 11/26/2022 Microscopic hematuria - 03/30/2023, - 10/13/2022, - 09/22/2022, - 08/04/2022, - 06/16/2022, - 05/18/2022, - 04/23/2022  Chronic cystitis (w/o hematuria) - 10/13/2022 Urge incontinence - 09/22/2022, - 08/04/2022, - 04/23/2022 Urinary Tract Inf, Unspec site - 09/22/2022 Urinary Frequency - 06/16/2022, - 04/23/2022 Nocturia - 04/23/2022      PMH Notes:  1898-10-11 00:00:00 - Note: Normal Routine History And Physical Designer, 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)   NON-GU PMH: Atrial Fibrillation Cardiac murmur, unspecified GERD Hypercholesterolemia Hypertension Hyperthyroidism Other heart failure    FAMILY HISTORY: Cancer - Daughter Depression - Daughter Heart problem - Daughter Kidney Stones - Daughter Prostate Cancer - Brother renal failure - Aunt   SOCIAL HISTORY: Marital Status: Widowed    REVIEW OF SYSTEMS:    GU Review Female:   Patient denies frequent urination, hard to postpone  urination, burning /pain with urination, get up at night to urinate, leakage of urine, stream starts and stops, trouble starting your stream, have to strain to urinate, and being pregnant.  Gastrointestinal (Upper):   Patient denies nausea, vomiting, and indigestion/ heartburn.  Gastrointestinal (Lower):   Patient denies diarrhea and constipation.  Constitutional:   Patient denies night sweats, weight loss, fever, and fatigue.  Skin:   Patient denies skin rash/ lesion and itching.  Eyes:   Patient denies blurred vision and double vision.  Ears/ Nose/ Throat:   Patient denies sore throat and sinus problems.  Hematologic/Lymphatic:   Patient denies swollen glands and easy bruising.  Cardiovascular:   Patient denies leg swelling and chest pains.  Respiratory:   Patient denies cough and shortness of breath.  Endocrine:   Patient denies excessive thirst.  Musculoskeletal:   Patient denies back pain and joint pain.  Neurological:   Patient denies headaches and dizziness.  Psychologic:   Patient denies depression and anxiety.   VITAL SIGNS: None   Complexity of Data:  Source Of History:  Patient  Records Review:   Previous Doctor Records, Previous Patient Records  Urine Test Review:   Urinalysis   PROCEDURES:         Flexible Cystoscopy - 52000  Risks, benefits, and the potential complications of the procedure were discussed with the patient including infection, bleeding, voiding discomfort, urinary retention, etc. All questions were answered. Consent was obtained. Sterile technique and intraurethral analgesia were used.  Meatus:  Normal size. Normal location. Normal condition.  Urethra:  Normal urethra  Ureteral Orifices:  Normal location. Normal size. Normal shape. Effluxed clear urine.  Bladder:  No trabeculation. On the posterior bladder wall towards the left she had flat erythema, likely inflammation but cannot rule out CIS. Had a separate patch of erythema posteriorly towards the dome with  friable mucosa      The procedure was well-tolerated and without complications. Instructions were given to call the office if she developed any problems. The patient stated that she understood these instructions.         Urinalysis w/Scope Dipstick Dipstick Cont'd Micro  Color: Red Bilirubin: Neg mg/dL WBC/hpf: >16/XWR  Appearance: Cloudy Ketones: Trace mg/dL RBC/hpf: >60/AVW  Specific Gravity: 1.025 Blood: 3+ ery/uL Bacteria: Many (>50/hpf)  pH: 5.5 Protein: 3+ mg/dL Cystals: NS (Not Seen)  Glucose: Trace mg/dL Urobilinogen: 1.0 mg/dL Casts: Hyaline    Nitrites: Positive Trichomonas: Not Present    Leukocyte Esterase: 3+ leu/uL Mucous: Present      Epithelial Cells: 6 - 10/hpf      Yeast: NS (Not Seen)      Sperm: Not Present    Notes: QNS for spun micro.    ASSESSMENT:      ICD-10 Details  1 GU:  Bladder Cancer overlapping sites - C67.8 Chronic, Stable  2   CIS of the bladder - D09.0 Chronic, Stable     PLAN:           Orders Labs Urine Culture          Document Letter(s):  Created for Patient: Clinical Summary         Notes:   Plan for cystoscopy with bladder biopsy and fulguration, possible TURBT. Risk benefits discussed.   CC: Dr. Schuyler Custard    Signed by Leila Punt, III, M.D. on 01/11/24 at 3:59 PM (EDT

## 2024-01-23 NOTE — Anesthesia Procedure Notes (Signed)
 Procedure Name: Intubation Date/Time: 01/23/2024 2:45 PM  Performed by: Elaina Graver, CRNAPre-anesthesia Checklist: Patient identified, Emergency Drugs available, Suction available and Patient being monitored Patient Re-evaluated:Patient Re-evaluated prior to induction Oxygen Delivery Method: Circle System Utilized Preoxygenation: Pre-oxygenation with 100% oxygen Induction Type: IV induction Ventilation: Mask ventilation without difficulty Laryngoscope Size: Mac and 4 Grade View: Grade II Tube type: Oral Tube size: 7.0 mm Number of attempts: 1 Placement Confirmation: ETT inserted through vocal cords under direct vision, positive ETCO2 and breath sounds checked- equal and bilateral Secured at: 20 cm Tube secured with: Tape Dental Injury: Teeth and Oropharynx as per pre-operative assessment

## 2024-01-23 NOTE — Anesthesia Preprocedure Evaluation (Addendum)
 Anesthesia Evaluation  Patient identified by MRN, date of birth, ID band Patient awake    Reviewed: Allergy & Precautions, NPO status , Patient's Chart, lab work & pertinent test results  Airway Mallampati: II  TM Distance: >3 FB Neck ROM: Full    Dental no notable dental hx.    Pulmonary former smoker   Pulmonary exam normal        Cardiovascular hypertension, + Peripheral Vascular Disease  Normal cardiovascular exam+ dysrhythmias Atrial Fibrillation      Neuro/Psych negative neurological ROS  negative psych ROS   GI/Hepatic Neg liver ROS, PUD,,,  Endo/Other  diabetes, Insulin DependentHypothyroidism    Renal/GU Renal disease     Musculoskeletal  (+) Arthritis ,    Abdominal   Peds  Hematology  (+) Blood dyscrasia (Eliquis), anemia   Anesthesia Other Findings BLADDER TUMOR  Reproductive/Obstetrics                             Anesthesia Physical Anesthesia Plan  ASA: 3  Anesthesia Plan: General   Post-op Pain Management:    Induction: Intravenous  PONV Risk Score and Plan: 3 and Ondansetron, Dexamethasone and Treatment may vary due to age or medical condition  Airway Management Planned: Oral ETT  Additional Equipment:   Intra-op Plan:   Post-operative Plan: Extubation in OR  Informed Consent: I have reviewed the patients History and Physical, chart, labs and discussed the procedure including the risks, benefits and alternatives for the proposed anesthesia with the patient or authorized representative who has indicated his/her understanding and acceptance.     Dental advisory given  Plan Discussed with: CRNA  Anesthesia Plan Comments:        Anesthesia Quick Evaluation

## 2024-01-23 NOTE — Inpatient Diabetes Management (Signed)
 Inpatient Diabetes Program Recommendations  AACE/ADA: New Consensus Statement on Inpatient Glycemic Control (2015)  Target Ranges:  Prepandial:   less than 140 mg/dL      Peak postprandial:   less than 180 mg/dL (1-2 hours)      Critically ill patients:  140 - 180 mg/dL   Lab Results  Component Value Date   GLUCAP 92 01/23/2024   HGBA1C 10.1 (H) 08/10/2023    Review of Glycemic Control  Diabetes history: DM1 Outpatient Diabetes medications: Toujeo 12 units at bedtime, Novolog 5 units TID if BG > 220 mg/dL Current orders for Inpatient glycemic control: Novolog 0-7 Q2H prn  CBGs 239, 370, 176, 92. Received Novolog 10 units @ 0951  HgbA1C - 10.1%  Inpatient Diabetes Program Recommendations:    Spoke with SS RN regarding pt's blood sugar dropping this am. Pt is Type 1 DM and sensitive to insulin. RN will monitor blood sugars more frequently Q 30-60 minutes while in SS. Thus far, no s/s of hypoglycemia.  Watch.  Thank you. Joni Net, RD, LDN, CDCES Inpatient Diabetes Coordinator [phone]  I

## 2024-01-23 NOTE — Op Note (Signed)
 Operative Note  Preoperative diagnosis:  1.  Bladder tumor  Postoperative diagnosis: 1.  Bladder tumor-/medium  Procedure(s): 1.  Transurethral biopsy and destruction/fulguration of medium bladder lesion  Surgeon: Leila Punt, MD  Assistants: None  Anesthesia: General  Complications: None immediate  EBL: Minimal  Specimens: 1.  Bladder biopsies  Drains/Catheters: 1.  None  Intraoperative findings: 1.  Normal urethra 2.  On the left lateral wall just lateral to the ureteral orifice, she had an approximately 3 cm area of flat erythema that was biopsied several times and fulgurated.  On the posterior bladder wall she had a 2 cm area of erythema that was biopsied x 1 and fulgurated  Indication: 89 year old female with history of CIS underwent cystoscopy found to have a possible recurrence and presents for the previously mentioned operation.  Description of procedure:  The patient was identified and consent was obtained.  The patient was taken to the operating room and placed in the supine position.  The patient was placed under general anesthesia.  Perioperative antibiotics were administered.  The patient was placed in dorsal lithotomy.  Patient was prepped and draped in a standard sterile fashion and a timeout was performed.  A 21 French rigid cystoscope was advanced into the urethra and into the bladder.  Complete cystoscopy was performed with findings noted above.  Cold cup biopsies were used to obtain biopsies of the previously mentioned areas followed by fulguration with a Bugbee of the abnormal areas to treat the entire area.  There is no evidence of any active bleeding and there was no evidence of any perforation of the bladder.  I drained the bladder and withdrew the scope.  Patient tolerated the procedure well was stable postoperatively.  Plan: Follow-up in 1 week for pathology review

## 2024-01-24 ENCOUNTER — Encounter (HOSPITAL_COMMUNITY): Payer: Self-pay | Admitting: Urology

## 2024-01-25 LAB — SURGICAL PATHOLOGY

## 2024-01-26 ENCOUNTER — Other Ambulatory Visit: Payer: Self-pay | Admitting: Internal Medicine

## 2024-02-07 ENCOUNTER — Telehealth: Payer: Self-pay | Admitting: Internal Medicine

## 2024-02-07 DIAGNOSIS — C678 Malignant neoplasm of overlapping sites of bladder: Secondary | ICD-10-CM | POA: Diagnosis not present

## 2024-02-07 DIAGNOSIS — D090 Carcinoma in situ of bladder: Secondary | ICD-10-CM | POA: Diagnosis not present

## 2024-02-07 NOTE — Telephone Encounter (Signed)
 Patient stopped by office on 02/06/24. She had a letter from [facility] that said she needs an Chief Strategy Officer to perform a behind the wheel test to determine her ability to safely operate a motor vehicle.  Patient said she will be charged >$700 for this Museum/gallery conservator to be completed. She said she has not had to pay this in the past. She said has an upcoming appointment to dispute this charge.   She states today that she is requesting a copy of her last office note (06/2023 - D. Wittenborn, NP) which reflects her health. Also explained that her last visit with Dr. Maximo Spar was in 2022.   Advised will send a message to our medical records team to process her request for a copy of her office note, and she will be contacted accordingly.   She also states that the [facility] letter was not intended for me (our practice) and a copy did not need to be kept and she did not need this back.

## 2024-02-09 ENCOUNTER — Telehealth: Payer: Self-pay | Admitting: Internal Medicine

## 2024-02-09 NOTE — Telephone Encounter (Signed)
 Patient's daughter Mariah Shines returned call.

## 2024-02-09 NOTE — Telephone Encounter (Signed)
 Pt c/o medication issue:  1. Name of Medication: amiodarone  (PACERONE ) 100 MG tablet   2. How are you currently taking this medication (dosage and times per day)? 200 mg, Daily   3. Are you having a reaction (difficulty breathing--STAT)? No  4. What is your medication issue? Started dose increase to 20mg  and pt can hardly function. Pt's daughter Mariah Shines requesting cb to know why increase was made and to discuss how its making her feel

## 2024-02-09 NOTE — Telephone Encounter (Signed)
 Attempted to call patient, no answer left message requesting a call back.

## 2024-02-09 NOTE — Telephone Encounter (Signed)
 Called patient's daughter, DPR, about message. Patient has been taking Amiodarone  100 mg for a while now, then on 01/26/24 the dose was changed and a refill was sent in for Amiodarone  200 mg. Patient started taking increased dose and has felt weak and tired since. Patient stated taking any Amiodarone  yesterday. Patient's daughter stated patient's blood sugar was extremely high today, and she had to take insulin . Informed her that amiodarone  should not effect her glucose levels. Patient has felt somewhat better not taking amiodarone , but encouraged her to have patient take 100 mg of amiodarone  until we clarify with Dr. Maximo Spar about increase.

## 2024-02-10 ENCOUNTER — Other Ambulatory Visit (HOSPITAL_BASED_OUTPATIENT_CLINIC_OR_DEPARTMENT_OTHER): Payer: Self-pay

## 2024-02-10 ENCOUNTER — Other Ambulatory Visit (HOSPITAL_BASED_OUTPATIENT_CLINIC_OR_DEPARTMENT_OTHER): Payer: Self-pay | Admitting: Internal Medicine

## 2024-02-10 MED ORDER — AMIODARONE HCL 100 MG PO TABS
100.0000 mg | ORAL_TABLET | Freq: Every day | ORAL | 3 refills | Status: DC
  Filled 2024-02-10: qty 90, 90d supply, fill #0

## 2024-02-10 NOTE — Telephone Encounter (Signed)
 Called patient with Dr. Candida Chalk response.  Karen Lites, MD to Me    02/10/24  3:42 PM I'm not sure how the 200 mg daily order is on her chart when she already has an order for 100 mg daily - I don't see anyone that ordered the higher dose. She should go back to 100 mg daily - I have deleted the 200 mg dose and renewed the 100 mg dose.  Dr. Maximo Spar  Patient verbalized understanding.

## 2024-02-11 ENCOUNTER — Encounter (HOSPITAL_BASED_OUTPATIENT_CLINIC_OR_DEPARTMENT_OTHER): Payer: Self-pay | Admitting: Emergency Medicine

## 2024-02-11 ENCOUNTER — Emergency Department (HOSPITAL_BASED_OUTPATIENT_CLINIC_OR_DEPARTMENT_OTHER)
Admission: EM | Admit: 2024-02-11 | Discharge: 2024-02-11 | Disposition: A | Discharge status: Home / Self Care | Attending: Emergency Medicine | Admitting: Emergency Medicine

## 2024-02-11 ENCOUNTER — Other Ambulatory Visit: Payer: Self-pay

## 2024-02-11 DIAGNOSIS — N3001 Acute cystitis with hematuria: Secondary | ICD-10-CM | POA: Diagnosis not present

## 2024-02-11 DIAGNOSIS — R531 Weakness: Secondary | ICD-10-CM

## 2024-02-11 DIAGNOSIS — D539 Nutritional anemia, unspecified: Secondary | ICD-10-CM | POA: Insufficient documentation

## 2024-02-11 DIAGNOSIS — I10 Essential (primary) hypertension: Secondary | ICD-10-CM | POA: Diagnosis not present

## 2024-02-11 DIAGNOSIS — D649 Anemia, unspecified: Secondary | ICD-10-CM

## 2024-02-11 DIAGNOSIS — Z7901 Long term (current) use of anticoagulants: Secondary | ICD-10-CM | POA: Diagnosis not present

## 2024-02-11 DIAGNOSIS — E875 Hyperkalemia: Secondary | ICD-10-CM | POA: Insufficient documentation

## 2024-02-11 DIAGNOSIS — R030 Elevated blood-pressure reading, without diagnosis of hypertension: Secondary | ICD-10-CM

## 2024-02-11 LAB — URINALYSIS, MICROSCOPIC (REFLEX)

## 2024-02-11 LAB — COMPREHENSIVE METABOLIC PANEL WITH GFR
ALT: 7 U/L (ref 0–44)
AST: 15 U/L (ref 15–41)
Albumin: 3.7 g/dL (ref 3.5–5.0)
Alkaline Phosphatase: 137 U/L — ABNORMAL HIGH (ref 38–126)
Anion gap: 17 — ABNORMAL HIGH (ref 5–15)
BUN: 35 mg/dL — ABNORMAL HIGH (ref 8–23)
CO2: 19 mmol/L — ABNORMAL LOW (ref 22–32)
Calcium: 9.3 mg/dL (ref 8.9–10.3)
Chloride: 95 mmol/L — ABNORMAL LOW (ref 98–111)
Creatinine, Ser: 1.74 mg/dL — ABNORMAL HIGH (ref 0.44–1.00)
GFR, Estimated: 28 mL/min — ABNORMAL LOW (ref >60)
Glucose, Bld: 272 mg/dL — ABNORMAL HIGH (ref 70–99)
Potassium: 5.8 mmol/L — ABNORMAL HIGH (ref 3.5–5.1)
Sodium: 131 mmol/L — ABNORMAL LOW (ref 135–145)
Total Bilirubin: 0.4 mg/dL (ref 0.0–1.2)
Total Protein: 7.9 g/dL (ref 6.5–8.1)

## 2024-02-11 LAB — TROPONIN T, HIGH SENSITIVITY
Troponin T High Sensitivity: <15 ng/L (ref <19)
Troponin T High Sensitivity: <15 ng/L (ref <19)

## 2024-02-11 LAB — CBC WITH DIFFERENTIAL/PLATELET
Abs Immature Granulocytes: 0.05 10*3/uL (ref 0.00–0.07)
Basophils Absolute: 0.1 10*3/uL (ref 0.0–0.1)
Basophils Relative: 1 %
Eosinophils Absolute: 0 10*3/uL (ref 0.0–0.5)
Eosinophils Relative: 0 %
HCT: 33.3 % — ABNORMAL LOW (ref 36.0–46.0)
Hemoglobin: 10.9 g/dL — ABNORMAL LOW (ref 12.0–15.0)
Immature Granulocytes: 0 %
Lymphocytes Relative: 9 %
Lymphs Abs: 1.2 10*3/uL (ref 0.7–4.0)
MCH: 27.5 pg (ref 26.0–34.0)
MCHC: 32.7 g/dL (ref 30.0–36.0)
MCV: 83.9 fL (ref 80.0–100.0)
Monocytes Absolute: 1 10*3/uL (ref 0.1–1.0)
Monocytes Relative: 7 %
Neutro Abs: 11 10*3/uL — ABNORMAL HIGH (ref 1.7–7.7)
Neutrophils Relative %: 83 %
Platelets: 365 10*3/uL (ref 150–400)
RBC: 3.97 MIL/uL (ref 3.87–5.11)
RDW: 14.3 % (ref 11.5–15.5)
WBC: 13.3 10*3/uL — ABNORMAL HIGH (ref 4.0–10.5)
nRBC: 0 % (ref 0.0–0.2)

## 2024-02-11 LAB — CBG MONITORING, ED: Glucose-Capillary: 227 mg/dL — ABNORMAL HIGH (ref 70–99)

## 2024-02-11 LAB — URINALYSIS, ROUTINE W REFLEX MICROSCOPIC
Bilirubin Urine: NEGATIVE
Glucose, UA: 100 mg/dL — AB
Ketones, ur: 40 mg/dL — AB
Nitrite: NEGATIVE
Protein, ur: NEGATIVE mg/dL
Specific Gravity, Urine: 1.015 (ref 1.005–1.030)
pH: 6.5 (ref 5.0–8.0)

## 2024-02-11 LAB — LIPASE, BLOOD: Lipase: 18 U/L (ref 11–51)

## 2024-02-11 MED ORDER — CEPHALEXIN 500 MG PO CAPS
500.0000 mg | ORAL_CAPSULE | Freq: Three times a day (TID) | ORAL | 0 refills | Status: DC
Start: 2024-02-11 — End: 2024-02-11

## 2024-02-11 MED ORDER — SODIUM CHLORIDE 0.9 % IV SOLN
1.0000 g | Freq: Once | INTRAVENOUS | Status: AC
  Administered 2024-02-11: 1 g via INTRAVENOUS
  Filled 2024-02-11: qty 10

## 2024-02-11 MED ORDER — SODIUM CHLORIDE 0.9 % IV BOLUS
500.0000 mL | Freq: Once | INTRAVENOUS | Status: AC
  Administered 2024-02-11: 500 mL via INTRAVENOUS

## 2024-02-11 MED ORDER — CEPHALEXIN 500 MG PO CAPS: 500.0000 mg | ORAL_CAPSULE | Freq: Three times a day (TID) | ORAL | 0 refills | Status: AC

## 2024-02-11 MED ORDER — FUROSEMIDE 10 MG/ML IJ SOLN
40.0000 mg | Freq: Once | INTRAMUSCULAR | Status: AC
  Administered 2024-02-11: 40 mg via INTRAVENOUS
  Filled 2024-02-11: qty 4

## 2024-02-11 NOTE — ED Triage Notes (Addendum)
 Pt c/o general weakness x 3d; denies pain; recent cystoscopy on 4/14; also daughter notes pt received a double dose of amiodarone  on Monday (x 1 dose) inadvertently; she resumed normal dose on Tues, but has felt poorly since Monday; pt got a "High Glucose Alert" from monitor while in triage

## 2024-02-11 NOTE — ED Notes (Signed)
 Pt notes a increase in weakness since an increase to her amiodarone  dose on Monday. Weakness started Tuesday.  Pt a&ox4

## 2024-02-11 NOTE — ED Notes (Signed)
 ED Provider at bedside.

## 2024-02-11 NOTE — ED Notes (Signed)
Urine culture added per lab

## 2024-02-11 NOTE — Discharge Instructions (Addendum)
 Today you were diagnosed with a urinary tract infection.  This could possibly be due to the bladder cystoscope and biopsy recently completed.  Antibiotics for Keflex  given in the emergency department and prescription sent to pharmacy to be started tomorrow morning.  I have also sent a culture to be grown to make sure that your bacteria is responsive to the antibiotics we gave.  If for any reason it is not my pharmacy team will call you to change the prescription.  You are also found to have a high potassium level of 5.8.  I gave you medication to help excrete potassium.  Please avoid bananas and avocados over the next week and please call your primary care physician first thing Monday morning to establish a follow-up appointment this week for potassium recheck.

## 2024-02-11 NOTE — ED Notes (Signed)
 Pt difficult stick. Stuck x 1 for labs.

## 2024-02-11 NOTE — ED Provider Notes (Signed)
 [place] [facility] Provider Note   CSN: [id number] Arrival date & time: 02/11/24  1801     History  Chief Complaint  Patient presents with   Weakness    [name] is a 89 y.o. female.  Patient is an 89 year old female with past medical history of A-fib on amiodarone  and Eliquis , hypertension, hyperlipidemia, anemia, and thyroid  disease presenting for complaints of weakness.  Patient states on Monday her prescription for amiodarone  was erroneously prescribed for 200 mg when she normally takes 100 mg she received one 200 mg dose on Monday and felt general weakness.  She contacted her primary care physician office who recommended she go back to her 100 mg dose.  They were not able to figure out why her prescription had changed.  Since that day she has continued to feel generalized weakness stating that is only able to get up from her bed to go to the bathroom and go back to the bed.  She normally is very active and goes outside daily.  She also has a past medical history of bladder cancer in which she had a biopsy 2 weeks ago.  She was told the biopsy was normal however the physician saw 2 lesions in which she recommended 1 more course of chemotherapy that started in June 2025.  The patient has had some hematuria since that is intermittent.  She does admit to nausea.  Denies abdominal pain, vomiting, diarrhea.  Last bowel movement was yesterday and described as soft and normal.  She denies hematochezia or melena.  She denies any fevers, chills, coughing, or infectious signs or symptoms.  She denies any chest pain or palpitations.  The history is provided by the patient. No language interpreter was used.  Weakness Associated symptoms: no abdominal pain, no arthralgias, no chest pain, no cough, no dysuria, no fever, no seizures, no shortness of breath and no vomiting        Home Medications Prior to Admission medications   Medication Sig Start  Date End Date Taking? Authorizing Provider  cephALEXin  (KEFLEX ) 500 MG capsule Take 1 capsule (500 mg total) by mouth 3 (three) times daily for 7 days. 02/11/24 02/18/24 Yes Owen Blowers P, DO  acetaminophen  (TYLENOL ) 500 MG tablet Take 500 mg by mouth every 6 (six) hours as needed for moderate pain (pain score 4-6).    [provider]  amiodarone  (PACERONE ) 100 MG tablet Take 1 tablet (100 mg total) by mouth daily. 02/10/24   Hilty, Aviva Lemmings, MD  Ascorbic Acid (VITAMIN C) 100 MG tablet Take 100 mg by mouth daily.    [provider]  diltiazem  (TIADYLT  ER) 180 MG 24 hr capsule TAKE 1 CAPSULE BY MOUTH EVERY DAY Patient taking differently: Take 180 mg by mouth in the morning. 12/15/23   Hilty, Aviva Lemmings, MD  docusate sodium  (COLACE) 100 MG capsule Take 1 capsule (100 mg total) by mouth every 12 (twelve) hours. Patient taking differently: Take 100 mg by mouth daily. 11/27/23   Harlow Lighter, [place]  N, FNP  ELIQUIS  2.5 MG TABS tablet TAKE 1 TABLET BY MOUTH TWICE A DAY 11/09/23   Hilty, Aviva Lemmings, MD  furosemide  (LASIX ) 20 MG tablet TAKE 1 TABLET (20 MG TOTAL) BY MOUTH DAILY AS NEEDED FOR FLUID OR EDEMA. 10/13/23   Morey Ar, NP  insulin  aspart (NOVOLOG  FLEXPEN) 100 UNIT/ML FlexPen Inject 8 Units into the skin 3 (three) times daily with meals. Patient taking differently: Inject 5 Units into the skin  See admin instructions. If BG is >220 08/04/23   Macdonald Savoy, MD  insulin  glargine, 2 Unit Dial , (TOUJEO  MAX SOLOSTAR) 300 UNIT/ML Solostar Pen Inject 14 Units into the skin at bedtime. Patient taking differently: Inject 12 Units into the skin at bedtime. 08/04/23   Macdonald Savoy, MD  levothyroxine  (SYNTHROID ) 112 MCG tablet Take 112 mcg by mouth daily before breakfast.    [provider]  mirtazapine  (REMERON ) 30 MG tablet Take 30 mg by mouth at bedtime as needed (sleep).    [provider]  pantoprazole  (PROTONIX ) 40 MG tablet Take 40 mg by mouth daily.  12/30/23   [provider]  trimethoprim  (TRIMPEX ) 100 MG tablet Take 100 mg by mouth in the morning. 10/27/22   [provider]      Allergies    Patient has no known allergies.    Review of Systems   Review of Systems  Constitutional:  Negative for chills and fever.  HENT:  Negative for ear pain and sore throat.   Eyes:  Negative for pain and visual disturbance.  Respiratory:  Negative for cough and shortness of breath.   Cardiovascular:  Negative for chest pain and palpitations.  Gastrointestinal:  Negative for abdominal pain and vomiting.  Genitourinary:  Positive for hematuria. Negative for dysuria.  Musculoskeletal:  Negative for arthralgias and back pain.  Skin:  Negative for color change and rash.  Neurological:  Positive for weakness. Negative for seizures and syncope.  All other systems reviewed and are negative.   Physical Exam Updated Vital Signs BP (!) 171/56   Pulse 71   Temp 99.2 F (37.3 C) (Oral)   Resp 20   Ht 5\' 2"  (1.575 m)   Wt 57.6 kg   SpO2 96%   BMI 23.23 kg/m  Physical Exam Vitals and nursing note reviewed.  Constitutional:      General: She is not in acute distress.    Appearance: She is well-developed.  HENT:     Head: Normocephalic and atraumatic.  Eyes:     Conjunctiva/sclera: Conjunctivae normal.  Cardiovascular:     Rate and Rhythm: Normal rate and regular rhythm.     Heart sounds: No murmur heard. Pulmonary:     Effort: Pulmonary effort is normal. No respiratory distress.     Breath sounds: Normal breath sounds.  Abdominal:     Palpations: Abdomen is soft.     Tenderness: There is no abdominal tenderness.  Musculoskeletal:        General: No swelling.     Cervical back: Neck supple.  Skin:    General: Skin is warm and dry.     Capillary Refill: Capillary refill takes less than 2 seconds.  Neurological:     Mental Status: She is alert.  Psychiatric:        Mood and Affect: Mood normal.     ED Results /  Procedures / Treatments   Labs (all labs ordered are listed, but only abnormal results are displayed) Labs Reviewed  CBC WITH DIFFERENTIAL/PLATELET - Abnormal; Notable for the following components:      Result Value   WBC 13.3 (*)    Hemoglobin 10.9 (*)    HCT 33.3 (*)    Neutro Abs 11.0 (*)    All other components within normal limits  COMPREHENSIVE METABOLIC PANEL WITH GFR - Abnormal; Notable for the following components:   Sodium 131 (*)    Potassium 5.8 (*)    Chloride 95 (*)  CO2 19 (*)    Glucose, Bld 272 (*)    BUN 35 (*)    Creatinine, Ser 1.74 (*)    Alkaline Phosphatase 137 (*)    GFR, Estimated 28 (*)    Anion gap 17 (*)    All other components within normal limits  URINALYSIS, ROUTINE W REFLEX MICROSCOPIC - Abnormal; Notable for the following components:   APPearance HAZY (*)    Glucose, UA 100 (*)    Hgb urine dipstick LARGE (*)    Ketones, ur 40 (*)    Leukocytes,Ua MODERATE (*)    All other components within normal limits  URINALYSIS, MICROSCOPIC (REFLEX) - Abnormal; Notable for the following components:   Bacteria, UA MANY (*)    All other components within normal limits  CBG MONITORING, ED - Abnormal; Notable for the following components:   Glucose-Capillary 227 (*)    All other components within normal limits  URINE CULTURE  LIPASE, BLOOD  TSH  TROPONIN T, HIGH SENSITIVITY  TROPONIN T, HIGH SENSITIVITY    EKG EKG Interpretation Date/Time:  Saturday Feb 11 2024 18:12:50 EDT Ventricular Rate:  70 PR Interval:  184 QRS Duration:  101 QT Interval:  438 QTC Calculation: 473 R Axis:   -39  Text Interpretation: Sinus rhythm Consider left atrial enlargement Left axis deviation Confirmed by Owen Blowers ([id number]) on 02/11/2024 10:08:50 PM  Radiology No results found.  Procedures Procedures    Medications Ordered in ED Medications  cefTRIAXone  (ROCEPHIN ) 1 g in sodium chloride  0.9 % 100 mL IVPB (1 g Intravenous New Bag/Given 02/11/24 2146)  sodium  chloride 0.9 % bolus 500 mL (500 mLs Intravenous New Bag/Given 02/11/24 2145)  furosemide  (LASIX ) injection 40 mg (40 mg Intravenous Given 02/11/24 2136)    ED Course/ Medical Decision Making/ A&P                                 Medical Decision Making Amount and/or Complexity of Data Reviewed Labs: ordered.  Risk Prescription drug management.   Patient is 89 year old female with past medical history of A-fib on amiodarone  and Eliquis , hypertension, hyperlipidemia, anemia, and thyroid  disease presenting for complaints of weakness.  Patient is alert and oriented x 3, no acute distress, afebrile, hypertensive with a blood pressure initially reading 161/61 and repeat reading 157/61 with otherwise stable vital signs.  Physical exam demonstrates no acute findings.  Generalized weakness only.  No focal neurological deficits.  Differential diagnosis for generalized weakness includes but is not limited to ACS, cardiac arrhythmia, anemia from hematuria, electrolyte abnormalities, thyroid  dysfunction, generalized weakness from hypertension, hyperglycemia with or without DKA, urinary tract infection, etc.  History of anemia.  Last hemoglobin was recorded on 01/17/2024 at 11.  Hemoglobin today 10.9.  Patient recently had a cystoscopy with bladder biopsy for previous history of bladder cancer.  She was told her biopsies were normal but there was a few lesions in the bladder concerning and needs to start chemotherapy.  Her urine was positive for urinary tract infection today.  With 21-50 white blood cells, many bacteria, moderate leukocyte esterase, and negative for nitrates.  Positive for hemoglobin.  Minimal leukocytosis at 13.3 present.  Otherwise no signs or symptoms of sepsis.  Keflex  was started in the emergency department and prescription was sent to the pharmacy.  A urine culture was sent.  I recommend further following with her urology team if generalized weakness and hematuria does not improve.  Patient  also had hyperglycemia with a POC glucose 227.  Which seems to be on par with patient's previous glucose readings.  Most previous results are from 01/23/2024 which read a glucose of 246, 199 before that, 176 before that, 370 before that.  No signs of acidosis or DKA.  Patient was also found to have hyperkalemia with potassium level of 5.8.  No EKG changes.  Lasix  given in ED with recommendations to follow-up with her PCP this week for repeat electrolyte panel.  She states she eats avocados daily.  I recommend withholding avocados and bananas.  No supplemental potassium use.  Twelve-lead EKG demonstrates sinus rhythm with a rate of 70 bpm.  No ST segment elevation or depression.  Troponin x 2 stable.  Patient did have an elevated blood pressure reading of 171/56.  Due to recent infection and generalized unwellness will recommend repeat blood pressure check with PCP office when she is feeling better and consideration for initiation of blood pressure management medication if still elevated at that time.  Patient presented for generalized weakness and was found to have hyperglycemia without DKA, hyperkalemia without EKG changes, anemia likely secondary to hematuria, urinary tract infection without sepsis.  Recommended for close follow-up with PCP for reevaluation and electrolyte recheck.  Patient in no distress and overall condition improved here in the ED. Detailed discussions were had with the patient regarding current findings, and need for close f/u with PCP or on call doctor. The patient has been instructed to return immediately if the symptoms worsen in any way for re-evaluation. Patient verbalized understanding and is in agreement with current care plan. All questions answered prior to discharge.         Final Clinical Impression(s) / ED Diagnoses Final diagnoses:  Hyperkalemia  Acute cystitis with hematuria  Chronic anemia  Generalized weakness  Elevated blood pressure reading    Rx / DC  Orders ED Discharge Orders          Ordered    cephALEXin  (KEFLEX ) 500 MG capsule  3 times daily        02/11/24 2204              Quinn Bucco, DO 02/11/24 2211

## 2024-02-12 LAB — TSH: TSH: 8.731 u[IU]/mL — ABNORMAL HIGH (ref 0.350–4.500)

## 2024-02-13 DIAGNOSIS — Z794 Long term (current) use of insulin: Secondary | ICD-10-CM | POA: Diagnosis not present

## 2024-02-13 DIAGNOSIS — E1065 Type 1 diabetes mellitus with hyperglycemia: Secondary | ICD-10-CM | POA: Diagnosis not present

## 2024-02-13 DIAGNOSIS — E109 Type 1 diabetes mellitus without complications: Secondary | ICD-10-CM | POA: Diagnosis not present

## 2024-02-13 LAB — URINE CULTURE: Culture: NO GROWTH

## 2024-02-15 DIAGNOSIS — E1065 Type 1 diabetes mellitus with hyperglycemia: Secondary | ICD-10-CM | POA: Diagnosis not present

## 2024-02-16 DIAGNOSIS — Z09 Encounter for follow-up examination after completed treatment for conditions other than malignant neoplasm: Secondary | ICD-10-CM | POA: Diagnosis not present

## 2024-02-16 DIAGNOSIS — E875 Hyperkalemia: Secondary | ICD-10-CM | POA: Diagnosis not present

## 2024-02-16 DIAGNOSIS — Z8551 Personal history of malignant neoplasm of bladder: Secondary | ICD-10-CM | POA: Diagnosis not present

## 2024-02-16 DIAGNOSIS — R310 Gross hematuria: Secondary | ICD-10-CM | POA: Diagnosis not present

## 2024-02-21 DIAGNOSIS — Z9189 Other specified personal risk factors, not elsewhere classified: Secondary | ICD-10-CM | POA: Diagnosis not present

## 2024-03-02 ENCOUNTER — Other Ambulatory Visit: Payer: Self-pay

## 2024-03-02 ENCOUNTER — Encounter (HOSPITAL_COMMUNITY): Payer: Self-pay

## 2024-03-02 ENCOUNTER — Inpatient Hospital Stay (HOSPITAL_COMMUNITY)
Admission: EM | Admit: 2024-03-02 | Discharge: 2024-03-04 | DRG: 638 | Disposition: A | Discharge status: Home / Self Care | Attending: Internal Medicine | Admitting: Internal Medicine

## 2024-03-02 ENCOUNTER — Emergency Department (HOSPITAL_COMMUNITY)

## 2024-03-02 DIAGNOSIS — N1831 Chronic kidney disease, stage 3a: Secondary | ICD-10-CM | POA: Diagnosis present

## 2024-03-02 DIAGNOSIS — Z79890 Hormone replacement therapy (postmenopausal): Secondary | ICD-10-CM

## 2024-03-02 DIAGNOSIS — N179 Acute kidney failure, unspecified: Secondary | ICD-10-CM | POA: Diagnosis present

## 2024-03-02 DIAGNOSIS — I480 Paroxysmal atrial fibrillation: Secondary | ICD-10-CM | POA: Diagnosis present

## 2024-03-02 DIAGNOSIS — Z83438 Family history of other disorder of lipoprotein metabolism and other lipidemia: Secondary | ICD-10-CM

## 2024-03-02 DIAGNOSIS — E1165 Type 2 diabetes mellitus with hyperglycemia: Secondary | ICD-10-CM

## 2024-03-02 DIAGNOSIS — Z9641 Presence of insulin pump (external) (internal): Secondary | ICD-10-CM | POA: Diagnosis present

## 2024-03-02 DIAGNOSIS — Z794 Long term (current) use of insulin: Secondary | ICD-10-CM

## 2024-03-02 DIAGNOSIS — I10 Essential (primary) hypertension: Secondary | ICD-10-CM | POA: Diagnosis present

## 2024-03-02 DIAGNOSIS — Z79899 Other long term (current) drug therapy: Secondary | ICD-10-CM

## 2024-03-02 DIAGNOSIS — E860 Dehydration: Secondary | ICD-10-CM | POA: Diagnosis present

## 2024-03-02 DIAGNOSIS — E1010 Type 1 diabetes mellitus with ketoacidosis without coma: Secondary | ICD-10-CM | POA: Diagnosis present

## 2024-03-02 DIAGNOSIS — E1110 Type 2 diabetes mellitus with ketoacidosis without coma: Secondary | ICD-10-CM | POA: Diagnosis not present

## 2024-03-02 DIAGNOSIS — D631 Anemia in chronic kidney disease: Secondary | ICD-10-CM | POA: Diagnosis present

## 2024-03-02 DIAGNOSIS — E1051 Type 1 diabetes mellitus with diabetic peripheral angiopathy without gangrene: Secondary | ICD-10-CM | POA: Diagnosis present

## 2024-03-02 DIAGNOSIS — Z8249 Family history of ischemic heart disease and other diseases of the circulatory system: Secondary | ICD-10-CM

## 2024-03-02 DIAGNOSIS — E1022 Type 1 diabetes mellitus with diabetic chronic kidney disease: Secondary | ICD-10-CM | POA: Diagnosis present

## 2024-03-02 DIAGNOSIS — Z7901 Long term (current) use of anticoagulants: Secondary | ICD-10-CM | POA: Diagnosis not present

## 2024-03-02 DIAGNOSIS — E039 Hypothyroidism, unspecified: Secondary | ICD-10-CM | POA: Diagnosis present

## 2024-03-02 DIAGNOSIS — Z823 Family history of stroke: Secondary | ICD-10-CM

## 2024-03-02 DIAGNOSIS — R739 Hyperglycemia, unspecified: Secondary | ICD-10-CM | POA: Diagnosis not present

## 2024-03-02 DIAGNOSIS — R7989 Other specified abnormal findings of blood chemistry: Secondary | ICD-10-CM | POA: Diagnosis present

## 2024-03-02 DIAGNOSIS — K219 Gastro-esophageal reflux disease without esophagitis: Secondary | ICD-10-CM | POA: Diagnosis present

## 2024-03-02 DIAGNOSIS — Z809 Family history of malignant neoplasm, unspecified: Secondary | ICD-10-CM

## 2024-03-02 DIAGNOSIS — E871 Hypo-osmolality and hyponatremia: Secondary | ICD-10-CM | POA: Diagnosis present

## 2024-03-02 DIAGNOSIS — C679 Malignant neoplasm of bladder, unspecified: Secondary | ICD-10-CM | POA: Diagnosis present

## 2024-03-02 DIAGNOSIS — I129 Hypertensive chronic kidney disease with stage 1 through stage 4 chronic kidney disease, or unspecified chronic kidney disease: Secondary | ICD-10-CM | POA: Diagnosis present

## 2024-03-02 DIAGNOSIS — Z860100 Personal history of colon polyps, unspecified: Secondary | ICD-10-CM

## 2024-03-02 DIAGNOSIS — R935 Abnormal findings on diagnostic imaging of other abdominal regions, including retroperitoneum: Secondary | ICD-10-CM | POA: Diagnosis not present

## 2024-03-02 DIAGNOSIS — E7800 Pure hypercholesterolemia, unspecified: Secondary | ICD-10-CM | POA: Diagnosis present

## 2024-03-02 DIAGNOSIS — Z87891 Personal history of nicotine dependence: Secondary | ICD-10-CM

## 2024-03-02 DIAGNOSIS — D649 Anemia, unspecified: Secondary | ICD-10-CM | POA: Diagnosis present

## 2024-03-02 DIAGNOSIS — K5730 Diverticulosis of large intestine without perforation or abscess without bleeding: Secondary | ICD-10-CM | POA: Diagnosis not present

## 2024-03-02 DIAGNOSIS — N3090 Cystitis, unspecified without hematuria: Secondary | ICD-10-CM | POA: Diagnosis not present

## 2024-03-02 DIAGNOSIS — N1832 Chronic kidney disease, stage 3b: Secondary | ICD-10-CM | POA: Diagnosis present

## 2024-03-02 DIAGNOSIS — Z833 Family history of diabetes mellitus: Secondary | ICD-10-CM

## 2024-03-02 DIAGNOSIS — R109 Unspecified abdominal pain: Secondary | ICD-10-CM | POA: Diagnosis not present

## 2024-03-02 LAB — CBC WITH DIFFERENTIAL/PLATELET
Abs Immature Granulocytes: 0.05 10*3/uL (ref 0.00–0.07)
Basophils Absolute: 0.1 10*3/uL (ref 0.0–0.1)
Basophils Relative: 1 %
Eosinophils Absolute: 0.1 10*3/uL (ref 0.0–0.5)
Eosinophils Relative: 1 %
HCT: 28.3 % — ABNORMAL LOW (ref 36.0–46.0)
Hemoglobin: 8.8 g/dL — ABNORMAL LOW (ref 12.0–15.0)
Immature Granulocytes: 1 %
Lymphocytes Relative: 7 %
Lymphs Abs: 0.8 10*3/uL (ref 0.7–4.0)
MCH: 26.9 pg (ref 26.0–34.0)
MCHC: 31.1 g/dL (ref 30.0–36.0)
MCV: 86.5 fL (ref 80.0–100.0)
Monocytes Absolute: 0.8 10*3/uL (ref 0.1–1.0)
Monocytes Relative: 7 %
Neutro Abs: 9.3 10*3/uL — ABNORMAL HIGH (ref 1.7–7.7)
Neutrophils Relative %: 83 %
Platelets: 313 10*3/uL (ref 150–400)
RBC: 3.27 MIL/uL — ABNORMAL LOW (ref 3.87–5.11)
RDW: 13.9 % (ref 11.5–15.5)
WBC: 11.1 10*3/uL — ABNORMAL HIGH (ref 4.0–10.5)
nRBC: 0 % (ref 0.0–0.2)

## 2024-03-02 LAB — BASIC METABOLIC PANEL WITH GFR
Anion gap: 14 (ref 5–15)
Anion gap: 12 (ref 5–15)
BUN: 29 mg/dL — ABNORMAL HIGH (ref 8–23)
BUN: 28 mg/dL — ABNORMAL HIGH (ref 8–23)
CO2: 15 mmol/L — ABNORMAL LOW (ref 22–32)
CO2: 19 mmol/L — ABNORMAL LOW (ref 22–32)
Calcium: 8 mg/dL — ABNORMAL LOW (ref 8.9–10.3)
Calcium: 8.2 mg/dL — ABNORMAL LOW (ref 8.9–10.3)
Chloride: 103 mmol/L (ref 98–111)
Chloride: 108 mmol/L (ref 98–111)
Creatinine, Ser: 2.51 mg/dL — ABNORMAL HIGH (ref 0.44–1.00)
Creatinine, Ser: 2.55 mg/dL — ABNORMAL HIGH (ref 0.44–1.00)
GFR, Estimated: 18 mL/min — ABNORMAL LOW (ref >60)
GFR, Estimated: 18 mL/min — ABNORMAL LOW (ref >60)
Glucose, Bld: 288 mg/dL — ABNORMAL HIGH (ref 70–99)
Glucose, Bld: 76 mg/dL (ref 70–99)
Potassium: 3.7 mmol/L (ref 3.5–5.1)
Potassium: 3.8 mmol/L (ref 3.5–5.1)
Sodium: 132 mmol/L — ABNORMAL LOW (ref 135–145)
Sodium: 139 mmol/L (ref 135–145)

## 2024-03-02 LAB — COMPREHENSIVE METABOLIC PANEL WITH GFR
ALT: 12 U/L (ref 0–44)
AST: 15 U/L (ref 15–41)
Albumin: 2.6 g/dL — ABNORMAL LOW (ref 3.5–5.0)
Alkaline Phosphatase: 84 U/L (ref 38–126)
Anion gap: 13 (ref 5–15)
BUN: 30 mg/dL — ABNORMAL HIGH (ref 8–23)
CO2: 18 mmol/L — ABNORMAL LOW (ref 22–32)
Calcium: 8 mg/dL — ABNORMAL LOW (ref 8.9–10.3)
Chloride: 97 mmol/L — ABNORMAL LOW (ref 98–111)
Creatinine, Ser: 2.64 mg/dL — ABNORMAL HIGH (ref 0.44–1.00)
GFR, Estimated: 17 mL/min — ABNORMAL LOW (ref >60)
Glucose, Bld: 501 mg/dL (ref 70–99)
Potassium: 4.7 mmol/L (ref 3.5–5.1)
Sodium: 128 mmol/L — ABNORMAL LOW (ref 135–145)
Total Bilirubin: 0.7 mg/dL (ref 0.0–1.2)
Total Protein: 7 g/dL (ref 6.5–8.1)

## 2024-03-02 LAB — BLOOD GAS, VENOUS
Acid-base deficit: 9.9 mmol/L — ABNORMAL HIGH (ref 0.0–2.0)
Bicarbonate: 16.1 mmol/L — ABNORMAL LOW (ref 20.0–28.0)
O2 Saturation: 81.3 %
Patient temperature: 37
pCO2, Ven: 35 mmHg — ABNORMAL LOW (ref 44–60)
pH, Ven: 7.27 (ref 7.25–7.43)
pO2, Ven: 49 mmHg — ABNORMAL HIGH (ref 32–45)

## 2024-03-02 LAB — BETA-HYDROXYBUTYRIC ACID
Beta-Hydroxybutyric Acid: 3.26 mmol/L — ABNORMAL HIGH (ref 0.05–0.27)
Beta-Hydroxybutyric Acid: 0.32 mmol/L — ABNORMAL HIGH (ref 0.05–0.27)
Beta-Hydroxybutyric Acid: 0.08 mmol/L (ref 0.05–0.27)

## 2024-03-02 LAB — GLUCOSE, CAPILLARY
Glucose-Capillary: 287 mg/dL — ABNORMAL HIGH (ref 70–99)
Glucose-Capillary: 250 mg/dL — ABNORMAL HIGH (ref 70–99)
Glucose-Capillary: 113 mg/dL — ABNORMAL HIGH (ref 70–99)
Glucose-Capillary: 46 mg/dL — ABNORMAL LOW (ref 70–99)
Glucose-Capillary: 55 mg/dL — ABNORMAL LOW (ref 70–99)
Glucose-Capillary: 67 mg/dL — ABNORMAL LOW (ref 70–99)
Glucose-Capillary: 134 mg/dL — ABNORMAL HIGH (ref 70–99)
Glucose-Capillary: 211 mg/dL — ABNORMAL HIGH (ref 70–99)
Glucose-Capillary: 348 mg/dL — ABNORMAL HIGH (ref 70–99)
Glucose-Capillary: 278 mg/dL — ABNORMAL HIGH (ref 70–99)

## 2024-03-02 LAB — CBG MONITORING, ED
Glucose-Capillary: 440 mg/dL — ABNORMAL HIGH (ref 70–99)
Glucose-Capillary: 440 mg/dL — ABNORMAL HIGH (ref 70–99)
Glucose-Capillary: 455 mg/dL — ABNORMAL HIGH (ref 70–99)
Glucose-Capillary: 392 mg/dL — ABNORMAL HIGH (ref 70–99)
Glucose-Capillary: 468 mg/dL — ABNORMAL HIGH (ref 70–99)

## 2024-03-02 LAB — URINALYSIS, W/ REFLEX TO CULTURE (INFECTION SUSPECTED)
Bacteria, UA: NONE SEEN
Bilirubin Urine: NEGATIVE
Glucose, UA: >=500 mg/dL — AB
Ketones, ur: 20 mg/dL — AB
Nitrite: NEGATIVE
Protein, ur: NEGATIVE mg/dL
Specific Gravity, Urine: 1.008 (ref 1.005–1.030)
pH: 7 (ref 5.0–8.0)

## 2024-03-02 LAB — MRSA NEXT GEN BY PCR, NASAL: MRSA by PCR Next Gen: NOT DETECTED

## 2024-03-02 MED ORDER — LEVOTHYROXINE SODIUM 112 MCG PO TABS
112.0000 ug | ORAL_TABLET | Freq: Every day | ORAL | Status: DC
  Administered 2024-03-03 – 2024-03-04 (×2): 112 ug via ORAL
  Filled 2024-03-02 (×2): qty 1

## 2024-03-02 MED ORDER — AMIODARONE HCL 200 MG PO TABS
100.0000 mg | ORAL_TABLET | Freq: Every day | ORAL | Status: DC
  Administered 2024-03-02 – 2024-03-04 (×3): 100 mg via ORAL
  Filled 2024-03-02 (×3): qty 1

## 2024-03-02 MED ORDER — DILTIAZEM HCL ER COATED BEADS 180 MG PO CP24
180.0000 mg | ORAL_CAPSULE | Freq: Every morning | ORAL | Status: DC
Start: 2024-03-02 — End: 2024-03-04
  Administered 2024-03-02 – 2024-03-04 (×3): 180 mg via ORAL
  Filled 2024-03-02 (×3): qty 1

## 2024-03-02 MED ORDER — VITAMIN D 25 MCG (1000 UNIT) PO TABS
1000.0000 [IU] | ORAL_TABLET | Freq: Every day | ORAL | Status: DC
  Administered 2024-03-02 – 2024-03-04 (×3): 1000 [IU] via ORAL
  Filled 2024-03-02 (×3): qty 1

## 2024-03-02 MED ORDER — SODIUM CHLORIDE 0.9 % IV SOLN: 1.0000 g | INTRAVENOUS | Status: DC

## 2024-03-02 MED ORDER — ALBUTEROL SULFATE (2.5 MG/3ML) 0.083% IN NEBU: 2.5000 mg | INHALATION_SOLUTION | RESPIRATORY_TRACT | Status: DC | PRN

## 2024-03-02 MED ORDER — ORAL CARE MOUTH RINSE: 15.0000 mL | OROMUCOSAL | Status: DC | PRN

## 2024-03-02 MED ORDER — DEXTROSE IN LACTATED RINGERS 5 % IV SOLN: INTRAVENOUS | Status: DC

## 2024-03-02 MED ORDER — APIXABAN 2.5 MG PO TABS
2.5000 mg | ORAL_TABLET | Freq: Two times a day (BID) | ORAL | Status: DC
  Administered 2024-03-02 – 2024-03-04 (×5): 2.5 mg via ORAL
  Filled 2024-03-02 (×5): qty 1

## 2024-03-02 MED ORDER — INSULIN GLARGINE-YFGN 100 UNIT/ML ~~LOC~~ SOLN
6.0000 [IU] | Freq: Two times a day (BID) | SUBCUTANEOUS | Status: DC
  Administered 2024-03-02 (×2): 6 [IU] via SUBCUTANEOUS
  Filled 2024-03-02 (×3): qty 0.06

## 2024-03-02 MED ORDER — LACTATED RINGERS IV SOLN: INTRAVENOUS | Status: DC

## 2024-03-02 MED ORDER — INSULIN ASPART 100 UNIT/ML IJ SOLN
0.0000 [IU] | Freq: Three times a day (TID) | INTRAMUSCULAR | Status: DC
  Administered 2024-03-02: 11 [IU] via SUBCUTANEOUS
  Administered 2024-03-03 (×2): 8 [IU] via SUBCUTANEOUS
  Administered 2024-03-04: 3 [IU] via SUBCUTANEOUS

## 2024-03-02 MED ORDER — SODIUM CHLORIDE 0.9 % IV BOLUS
1000.0000 mL | Freq: Once | INTRAVENOUS | Status: AC
  Administered 2024-03-02: 1000 mL via INTRAVENOUS

## 2024-03-02 MED ORDER — ONDANSETRON HCL 4 MG PO TABS: 4.0000 mg | ORAL_TABLET | Freq: Four times a day (QID) | ORAL | Status: DC | PRN

## 2024-03-02 MED ORDER — INSULIN ASPART 100 UNIT/ML IJ SOLN
4.0000 [IU] | Freq: Three times a day (TID) | INTRAMUSCULAR | Status: DC
  Administered 2024-03-02 – 2024-03-03 (×2): 4 [IU] via SUBCUTANEOUS

## 2024-03-02 MED ORDER — DEXTROSE 50 % IV SOLN
0.0000 mL | INTRAVENOUS | Status: DC | PRN
  Administered 2024-03-02: 50 mL via INTRAVENOUS
  Filled 2024-03-02: qty 50

## 2024-03-02 MED ORDER — ONDANSETRON HCL 4 MG/2ML IJ SOLN: 4.0000 mg | Freq: Four times a day (QID) | INTRAMUSCULAR | Status: DC | PRN

## 2024-03-02 MED ORDER — CHLORHEXIDINE GLUCONATE CLOTH 2 % EX PADS
6.0000 | MEDICATED_PAD | Freq: Every day | CUTANEOUS | Status: DC
  Administered 2024-03-02: 6 via TOPICAL

## 2024-03-02 MED ORDER — SODIUM CHLORIDE 0.9 % IV BOLUS
500.0000 mL | Freq: Once | INTRAVENOUS | Status: AC
  Administered 2024-03-02: 500 mL via INTRAVENOUS

## 2024-03-02 MED ORDER — SODIUM CHLORIDE 0.9 % IV SOLN
1.0000 g | Freq: Once | INTRAVENOUS | Status: AC
  Administered 2024-03-02: 1 g via INTRAVENOUS
  Filled 2024-03-02: qty 10

## 2024-03-02 MED ORDER — ACETAMINOPHEN 650 MG RE SUPP: 650.0000 mg | Freq: Four times a day (QID) | RECTAL | Status: DC | PRN

## 2024-03-02 MED ORDER — INSULIN ASPART 100 UNIT/ML IJ SOLN
0.0000 [IU] | Freq: Every day | INTRAMUSCULAR | Status: DC
  Administered 2024-03-02: 3 [IU] via SUBCUTANEOUS

## 2024-03-02 MED ORDER — ACETAMINOPHEN 325 MG PO TABS: 650.0000 mg | ORAL_TABLET | Freq: Four times a day (QID) | ORAL | Status: DC | PRN

## 2024-03-02 MED ORDER — INSULIN REGULAR(HUMAN) IN NACL 100-0.9 UT/100ML-% IV SOLN
INTRAVENOUS | Status: DC
  Administered 2024-03-02: 5 [IU]/h via INTRAVENOUS
  Filled 2024-03-02: qty 100

## 2024-03-02 MED ORDER — PANTOPRAZOLE SODIUM 40 MG PO TBEC
40.0000 mg | DELAYED_RELEASE_TABLET | Freq: Every day | ORAL | Status: DC
  Administered 2024-03-02 – 2024-03-04 (×3): 40 mg via ORAL
  Filled 2024-03-02 (×3): qty 1

## 2024-03-02 MED ORDER — DILTIAZEM HCL ER BEADS 180 MG PO CP24: 180.0000 mg | ORAL_CAPSULE | Freq: Every morning | ORAL | Status: DC

## 2024-03-02 MED ORDER — MIRTAZAPINE 15 MG PO TABS
30.0000 mg | ORAL_TABLET | Freq: Every evening | ORAL | Status: DC | PRN
  Filled 2024-03-02: qty 2

## 2024-03-02 NOTE — ED Provider Notes (Signed)
 [place] EMERGENCY DEPARTMENT AT [facility] Provider Note   CSN: [id number] Arrival date & time: 03/02/24  0150     History  Chief Complaint  Patient presents with   Hyperglycemia    [name] is a 89 y.o. female.   Hyperglycemia [name] is a 89 y.o. female who presents to the Emergency Department complaining of hyperglycemia.  She presents to the emergency department for evaluation of elevated blood sugars since around 7 PM.  No associated headache, chest pain, abdominal pain, nausea, vomiting, fever, dysuria.  She does report urinary frequency.  She does have a history of insulin -dependent diabetes and is on an OmniPod 5.  She had this placed on May 5 and was previously using insulin  pens.  To the best of her knowledge the pump is delivering insulin  appropriately.  She did have a UTI 2 weeks ago and was seen in the emergency department and completed her treatment.  Otherwise no medication changes or recent illnesses.  She has a history of hypertension, diabetes, A-fib on anticoagulation, chronic cystitis.      Home Medications Prior to Admission medications   Medication Sig Start Date End Date Taking? Authorizing Provider  amiodarone  (PACERONE ) 100 MG tablet Take 1 tablet (100 mg total) by mouth daily. 02/10/24  Yes Hilty, Aviva Lemmings, MD  cholecalciferol (VITAMIN D3) 25 MCG (1000 UNIT) tablet Take 1,000 Units by mouth daily.   Yes [provider]  diltiazem  (TIADYLT  ER) 180 MG 24 hr capsule TAKE 1 CAPSULE BY MOUTH EVERY DAY Patient taking differently: Take 180 mg by mouth in the morning. 12/15/23  Yes Hilty, Aviva Lemmings, MD  ELIQUIS  2.5 MG TABS tablet TAKE 1 TABLET BY MOUTH TWICE A DAY 11/09/23  Yes Hilty, Aviva Lemmings, MD  furosemide  (LASIX ) 20 MG tablet TAKE 1 TABLET (20 MG TOTAL) BY MOUTH DAILY AS NEEDED FOR FLUID OR EDEMA. 10/13/23  Yes Wittenborn, Bernardo Bridgeman, NP  insulin  aspart (NOVOLOG  FLEXPEN) 100 UNIT/ML FlexPen Inject 8 Units into the skin 3  (three) times daily with meals. Patient taking differently: Inject 5 Units into the skin See admin instructions. If BG is >220 08/04/23  Yes Macdonald Savoy, MD  insulin  glargine, 2 Unit Dial , (TOUJEO  MAX SOLOSTAR) 300 UNIT/ML Solostar Pen Inject 14 Units into the skin at bedtime. Patient taking differently: Inject 12 Units into the skin at bedtime. 08/04/23  Yes Macdonald Savoy, MD  levothyroxine  (SYNTHROID ) 112 MCG tablet Take 112 mcg by mouth daily before breakfast.   Yes [provider]  mirtazapine  (REMERON ) 30 MG tablet Take 30 mg by mouth at bedtime as needed (sleep).   Yes [provider]  pantoprazole  (PROTONIX ) 40 MG tablet Take 40 mg by mouth daily. 12/30/23  Yes [provider]  docusate sodium  (COLACE) 100 MG capsule Take 1 capsule (100 mg total) by mouth every 12 (twelve) hours. Patient not taking: Reported on 03/02/2024 11/27/23   Harlow Lighter, [place]  N, FNP      Allergies    Patient has no known allergies.    Review of Systems   Review of Systems  All other systems reviewed and are negative.   Physical Exam Updated Vital Signs BP 117/62   Pulse 80   Temp 98 F (36.7 C) (Oral)   Resp 18   Ht 5\' 2"  (1.575 m)   Wt 56.7 kg   SpO2 96%   BMI 22.86 kg/m  Physical Exam Vitals and nursing note reviewed.  Constitutional:  Appearance: She is well-developed.  HENT:     Head: Normocephalic and atraumatic.  Cardiovascular:     Rate and Rhythm: Normal rate and regular rhythm.     Heart sounds: Murmur heard.  Pulmonary:     Effort: Pulmonary effort is normal. No respiratory distress.     Breath sounds: Normal breath sounds.  Abdominal:     Palpations: Abdomen is soft.     Tenderness: There is no abdominal tenderness. There is no guarding or rebound.  Musculoskeletal:        General: No tenderness.     Comments: Trace edema to bilateral feet.  No wounds to toes.  2+ DP pulses.   Skin:    General: Skin is warm and dry.  Neurological:      Mental Status: She is alert and oriented to person, place, and time.  Psychiatric:        Behavior: Behavior normal.     ED Results / Procedures / Treatments   Labs (all labs ordered are listed, but only abnormal results are displayed) Labs Reviewed  COMPREHENSIVE METABOLIC PANEL WITH GFR - Abnormal; Notable for the following components:      Result Value   Sodium 128 (*)    Chloride 97 (*)    CO2 18 (*)    Glucose, Bld 501 (*)    BUN 30 (*)    Creatinine, Ser 2.64 (*)    Calcium  8.0 (*)    Albumin 2.6 (*)    GFR, Estimated 17 (*)    All other components within normal limits  CBC WITH DIFFERENTIAL/PLATELET - Abnormal; Notable for the following components:   WBC 11.1 (*)    RBC 3.27 (*)    Hemoglobin 8.8 (*)    HCT 28.3 (*)    Neutro Abs 9.3 (*)    All other components within normal limits  URINALYSIS, W/ REFLEX TO CULTURE (INFECTION SUSPECTED) - Abnormal; Notable for the following components:   Color, Urine COLORLESS (*)    Glucose, UA >=500 (*)    Hgb urine dipstick SMALL (*)    Ketones, ur 20 (*)    Leukocytes,Ua MODERATE (*)    All other components within normal limits  BLOOD GAS, VENOUS - Abnormal; Notable for the following components:   pCO2, Ven 35 (*)    pO2, Ven 49 (*)    Bicarbonate 16.1 (*)    Acid-base deficit 9.9 (*)    All other components within normal limits  BETA-HYDROXYBUTYRIC ACID - Abnormal; Notable for the following components:   Beta-Hydroxybutyric Acid 3.26 (*)    All other components within normal limits  CBG MONITORING, ED - Abnormal; Notable for the following components:   Glucose-Capillary 440 (*)    All other components within normal limits  CBG MONITORING, ED - Abnormal; Notable for the following components:   Glucose-Capillary 440 (*)    All other components within normal limits  CBG MONITORING, ED - Abnormal; Notable for the following components:   Glucose-Capillary 455 (*)    All other components within normal limits  CBG  MONITORING, ED - Abnormal; Notable for the following components:   Glucose-Capillary 468 (*)    All other components within normal limits  CBG MONITORING, ED - Abnormal; Notable for the following components:   Glucose-Capillary 392 (*)    All other components within normal limits  URINE CULTURE  BASIC METABOLIC PANEL WITH GFR  BASIC METABOLIC PANEL WITH GFR  BASIC METABOLIC PANEL WITH GFR  BASIC METABOLIC PANEL  WITH GFR  BASIC METABOLIC PANEL WITH GFR  BETA-HYDROXYBUTYRIC ACID  BETA-HYDROXYBUTYRIC ACID  BETA-HYDROXYBUTYRIC ACID  BETA-HYDROXYBUTYRIC ACID  BETA-HYDROXYBUTYRIC ACID    EKG EKG Interpretation Date/Time:  Friday Mar 02 2024 02:29:27 EDT Ventricular Rate:  88 PR Interval:  190 QRS Duration:  109 QT Interval:  415 QTC Calculation: 503 R Axis:   -23  Text Interpretation: Sinus rhythm LVH with secondary repolarization abnormality Prolonged QT interval Confirmed by Kelsey Patricia [phone]) on 03/02/2024 2:36:05 AM  Radiology CT ABDOMEN PELVIS WO CONTRAST Result Date: 03/02/2024 CLINICAL DATA:  Abdominal and flank pain.  Bladder infection. EXAM: CT ABDOMEN AND PELVIS WITHOUT CONTRAST TECHNIQUE: Multidetector CT imaging of the abdomen and pelvis was performed following the standard protocol without IV contrast. RADIATION DOSE REDUCTION: This exam was performed according to the departmental dose-optimization program which includes automated exposure control, adjustment of the mA and/or kV according to patient size and/or use of iterative reconstruction technique. COMPARISON:  CT stone study 09/16/2023 FINDINGS: Lower chest: Patchy ground-glass and consolidative opacity in the dependent lung bases is probably atelectatic although superimposed pneumonia is not excluded. No pleural effusion. Hepatobiliary: No suspicious focal abnormality in the liver on this study without intravenous contrast. Cholecystectomy. Common bile duct diameter is upper normal for patient age at 6-7 mm.  Pancreas: 8 mm hypodensity is identified in the pancreas near the junction of the body and tail (26/2). This is stable since prior study and comparing back to a study from 01/10/2023. Spleen: No splenomegaly. No suspicious focal mass lesion. Adrenals/Urinary Tract: Similar bilateral adrenal thickening without a discrete nodule or mass. Perinephric stranding is increased compared to the 09/16/2023 exam. No stones are seen in either kidney. Left gonadal vein phleboliths are stable since prior, confirmed on hematuria protocol CT 05/18/2022. The urinary bladder appears normal for the degree of distention. Stomach/Bowel: Stomach is decompressed. Duodenum is normally positioned as is the ligament of Treitz. No small bowel wall thickening. No small bowel dilatation. Terminal ileum not discretely visualized. The appendix is not well visualized, but there is no edema or inflammation in the region of the cecal tip to suggest appendicitis. No gross colonic mass. No colonic wall thickening. Diverticular changes are noted in the left colon without evidence of diverticulitis. Vascular/Lymphatic: There is moderate atherosclerotic calcification of the abdominal aorta without aneurysm. There is no gastrohepatic or hepatoduodenal ligament lymphadenopathy. No retroperitoneal or mesenteric lymphadenopathy. No pelvic sidewall lymphadenopathy. Reproductive: Hysterectomy.  There is no adnexal mass. Other: No intraperitoneal free fluid. Musculoskeletal: No worrisome lytic or sclerotic osseous abnormality. IMPRESSION: 1. No renal or ureteral stones. No hydronephrosis. 2. Perinephric stranding is increased compared to the 09/16/2023 exam. This is nonspecific and may be related to chronic medical renal disease. Correlation with urinalysis recommended to exclude urinary tract infection. 3. Patchy ground-glass and consolidative opacity in the dependent lung bases is probably atelectatic although superimposed pneumonia is not excluded. 4. 8 mm  hypodensity in the pancreas near the junction of the body and tail. This is stable since prior study and comparing back to a study from 01/10/2023. This is likely a benign etiology such as pseudocyst or small side branch IPMN. Follow-up MRI abdomen with and without contrast recommended in 2 years. This recommendation follows ACR consensus guidelines: Management of Incidental Pancreatic Cysts: A White Paper of the ACR Incidental Findings Committee. J Am Coll Radiol 2017;14:911-923. 5. Left colonic diverticulosis without diverticulitis. 6.  Aortic Atherosclerosis (ICD10-I70.0). Electronically Signed   By: Donnal Fusi M.D.   On: 03/02/2024 05:16  Procedures Procedures   CRITICAL CARE Performed by: Kelsey Patricia   Total critical care time: 35 minutes  Critical care time was exclusive of separately billable procedures and treating other patients.  Critical care was necessary to treat or prevent imminent or life-threatening deterioration.  Critical care was time spent personally by me on the following activities: development of treatment plan with patient and/or surrogate as well as nursing, discussions with consultants, evaluation of patient's response to treatment, examination of patient, obtaining history from patient or surrogate, ordering and performing treatments and interventions, ordering and review of laboratory studies, ordering and review of radiographic studies, pulse oximetry and re-evaluation of patient's condition.  Medications Ordered in ED Medications  insulin  regular, human (MYXREDLIN ) 100 units/ 100 mL infusion (5 Units/hr Intravenous Rate/Dose Verify 03/02/24 0544)  lactated ringers  infusion (has no administration in time range)  dextrose  5 % in lactated ringers  infusion (has no administration in time range)  dextrose  50 % solution 0-50 mL (has no administration in time range)  sodium chloride  0.9 % bolus 500 mL (0 mLs Intravenous Stopped 03/02/24 0415)  sodium chloride  0.9  % bolus 1,000 mL (0 mLs Intravenous Stopped 03/02/24 0630)  cefTRIAXone  (ROCEPHIN ) 1 g in sodium chloride  0.9 % 100 mL IVPB (0 g Intravenous Stopped 03/02/24 0540)    ED Course/ Medical Decision Making/ A&P                                 Medical Decision Making Amount and/or Complexity of Data Reviewed Labs: ordered. Radiology: ordered.  Risk Prescription drug management. Decision regarding hospitalization.   Patient with history of insulin -dependent diabetes here for evaluation of hyperglycemia and urinary frequency.  She does have a new OmniPod.  She does not have any additional systemic symptoms.  She was recently treated with antibiotics for UTI.  Labs today are significant for AKI with elevation in her creatinine as well as mildly decreased bicarb and slightly elevated anion gap concerning for DKA.  She was started on glucose stabilizer for DKA.  She was also treated with IV fluid hydration given her AKI.  CT scan with chronic findings although does demonstrate some increased perinephric stranding on the right.  UA does have leukocytes present, history of recurrent UTIs-will send a culture and start on antibiotics for possible recurrent UTI.  Discussed with patient, daughter at bedside findings of studies and recommendation for admission and they are in agreement with plan treatment plan.  Hospitalist consulted for admission.        Final Clinical Impression(s) / ED Diagnoses Final diagnoses:  Diabetic ketoacidosis without coma associated with type 1 diabetes mellitus (HCC)  AKI (acute kidney injury) [facility])    Rx / DC Orders ED Discharge Orders     None         Kelsey Patricia, MD 03/02/24 [phone]

## 2024-03-02 NOTE — Care Management Obs Status (Signed)
 MEDICARE OBSERVATION STATUS NOTIFICATION   Patient Details  Name: [name] MRN: [id number] Date of Birth: December 24, 1934   Medicare Observation Status Notification Given:  Yes    MahabirThersia Flax, RN 03/02/2024, 4:15 PM

## 2024-03-02 NOTE — H&P (Signed)
 History and Physical  [name] EAV:[id number] DOB: Feb 17, 1935 DOA: 03/02/2024  PCP: Imelda Man, MD   Chief Complaint: High blood sugar  HPI: [name] is a 89 y.o. female with medical history significant for type 1 diabetes longstanding insulin  dependence, hypertension, hyperlipidemia, hypothyroidism being admitted to the hospital with DKA.  She has been using insulin  pens for years, at the beginning of May she was transition to OmniPod 5.  States that this has been working well for her, the goal is to reduce the number of times she has to get up into the bathroom at night, and this has helped.  A couple of weeks ago, she was placed on antibiotics for UTI.  She also has been followed by Dr. Parke Boll for bladder tumor has had fulguration and BCG multiple times.  Currently, she denies any dysuria, fevers, nausea, vomiting.  She has been feeling perfectly fine, has been using her pump without difficulty.  However, last evening about 7 PM noticed that her blood sugars were going up.  She presented to the emergency department where initial blood sugar was 501 and lab work consistent with DKA.  She was started on empiric IV antibiotics after urinalysis was obtained.  She was placed on IV insulin  drip, IV fluids, and admitted to the hospitalist service for DKA.  Review of Systems: Please see HPI for pertinent positives and negatives. A complete 10 system review of systems are otherwise negative.  Past Medical History:  Diagnosis Date   Anemia    Arthritis    Colon polyp    Diabetes mellitus    type 1   Heart murmur    Hyperlipidemia    Hypertension    Hypothyroidism    Peripheral vascular disease (HCC)    Pneumonia    Poor appetite    Thyroid  disease    Past Surgical History:  Procedure Laterality Date   ABDOMINAL HYSTERECTOMY     APPENDECTOMY     BIOPSY  02/07/2022   Procedure: BIOPSY;  Surgeon: Asencion Blacksmith, MD;  Location: [facility] ENDOSCOPY;  Service: Gastroenterology;;    BREAST EXCISIONAL BIOPSY Right    CARDIOVASCULAR STRESS TEST  07/15/2006   Normal scan, no ECG changes   COLONOSCOPY WITH PROPOFOL  N/A 02/07/2022   Procedure: COLONOSCOPY WITH PROPOFOL ;  Surgeon: Asencion Blacksmith, MD;  Location: [facility] ENDOSCOPY;  Service: Gastroenterology;  Laterality: N/A;   CYSTOSCOPY WITH BIOPSY Bilateral 11/19/2022   Procedure: CYSTOSCOPY WITH TRANSURETHRAL RESECTION OF BLADDER TUMOR BILATERAL RETROGRADE PYELOGRAM;  Surgeon: Samson Croak, MD;  Location: [facility] ORS;  Service: Urology;  Laterality: Bilateral;  1 HR FOR CASE   CYSTOSCOPY WITH BIOPSY Bilateral 09/12/2023   Procedure: CYSTOSCOPY WITH BIOPSY FULGURATION  BILATERAL RETROGRADE PYELOGRAM, BILATERAL DIAGNOSTIC URETEROSCOPY;  Surgeon: Samson Croak, MD;  Location: [facility] ORS;  Service: Urology;  Laterality: Bilateral;   CYSTOSCOPY WITH FULGERATION N/A 01/23/2024   Procedure: CYSTOSCOPY, WITH BLADDER FULGURATION;  Surgeon: Samson Croak, MD;  Location: [facility] ORS;  Service: Urology;  Laterality: N/A;   ESOPHAGOGASTRODUODENOSCOPY (EGD) WITH PROPOFOL  N/A 02/07/2022   Procedure: ESOPHAGOGASTRODUODENOSCOPY (EGD) WITH PROPOFOL ;  Surgeon: Asencion Blacksmith, MD;  Location: [facility] ENDOSCOPY;  Service: Gastroenterology;  Laterality: N/A;   TRANSTHORACIC ECHOCARDIOGRAM  01/08/2011   EF 60-65%, moderae mitral regurg, LA mild-moderately dilated,   TRANSURETHRAL RESECTION OF BLADDER TUMOR N/A 04/04/2023   Procedure: TRANSURETHRAL RESECTION OF BLADDER TUMOR (TURBT);  Surgeon: Samson Croak, MD;  Location: [facility] ORS;  Service: Urology;  Laterality: N/A;  1 HR FOR CASE   TRANSURETHRAL RESECTION OF BLADDER TUMOR N/A 01/23/2024   Procedure: TURBT (TRANSURETHRAL RESECTION OF BLADDER TUMOR);  Surgeon: Samson Croak, MD;  Location: [facility] ORS;  Service: Urology;  Laterality: N/A;   Social History:  reports that she has quit smoking. Her smoking use included cigarettes. She has never used smokeless tobacco. She reports that she does not drink  alcohol and does not use drugs.  No Known Allergies  Family History  Problem Relation Age of Onset   Diabetes Mother    Hypertension Father    Hyperlipidemia Father    Diabetes Sister    Hypertension Sister    Hyperlipidemia Sister    Hypertension Brother    Stroke Sister    Hypertension Sister    Diabetes Sister    Cancer Daughter    Heart failure Child    Breast cancer Neg Hx      Prior to Admission medications   Medication Sig Start Date End Date Taking? Authorizing Provider  amiodarone  (PACERONE ) 100 MG tablet Take 1 tablet (100 mg total) by mouth daily. 02/10/24  Yes Hilty, Aviva Lemmings, MD  cholecalciferol (VITAMIN D3) 25 MCG (1000 UNIT) tablet Take 1,000 Units by mouth daily.   Yes [provider]  diltiazem  (TIADYLT  ER) 180 MG 24 hr capsule TAKE 1 CAPSULE BY MOUTH EVERY DAY Patient taking differently: Take 180 mg by mouth in the morning. 12/15/23  Yes Hilty, Aviva Lemmings, MD  ELIQUIS  2.5 MG TABS tablet TAKE 1 TABLET BY MOUTH TWICE A DAY 11/09/23  Yes Hilty, Aviva Lemmings, MD  furosemide  (LASIX ) 20 MG tablet TAKE 1 TABLET (20 MG TOTAL) BY MOUTH DAILY AS NEEDED FOR FLUID OR EDEMA. 10/13/23  Yes Wittenborn, Bernardo Bridgeman, NP  insulin  aspart (NOVOLOG  FLEXPEN) 100 UNIT/ML FlexPen Inject 8 Units into the skin 3 (three) times daily with meals. Patient taking differently: Inject 5 Units into the skin See admin instructions. If BG is >220 08/04/23  Yes Macdonald Savoy, MD  insulin  glargine, 2 Unit Dial , (TOUJEO  MAX SOLOSTAR) 300 UNIT/ML Solostar Pen Inject 14 Units into the skin at bedtime. Patient taking differently: Inject 12 Units into the skin at bedtime. 08/04/23  Yes Macdonald Savoy, MD  levothyroxine  (SYNTHROID ) 112 MCG tablet Take 112 mcg by mouth daily before breakfast.   Yes [provider]  mirtazapine  (REMERON ) 30 MG tablet Take 30 mg by mouth at bedtime as needed (sleep).   Yes [provider]  pantoprazole  (PROTONIX ) 40 MG tablet Take 40 mg by mouth  daily. 12/30/23  Yes [provider]  docusate sodium  (COLACE) 100 MG capsule Take 1 capsule (100 mg total) by mouth every 12 (twelve) hours. Patient not taking: Reported on 03/02/2024 11/27/23   Rexford Catchings, FNP    Physical Exam: BP 117/62   Pulse 80   Temp 98 F (36.7 C) (Oral)   Resp 18   Ht 5\' 2"  (1.575 m)   Wt 56.7 kg   SpO2 96%   BMI 22.86 kg/m  General:  Alert, oriented, calm, in no acute distress, looks comfortable and nontoxic Cardiovascular: RRR, no murmurs or rubs, no peripheral edema  Respiratory: clear to auscultation bilaterally, no wheezes, no crackles  Abdomen: soft, nontender, nondistended, normal bowel tones heard  Skin: dry, no rashes  Musculoskeletal: no joint effusions, normal range of motion  Psychiatric: appropriate affect, normal speech  Neurologic: extraocular muscles intact, clear speech, moving all extremities with intact sensorium  Labs on Admission:  Basic Metabolic Panel: Recent Labs  Lab 03/02/24 0232  NA 128*  K 4.7  CL 97*  CO2 18*  GLUCOSE 501*  BUN 30*  CREATININE 2.64*  CALCIUM  8.0*   Liver Function Tests: Recent Labs  Lab 03/02/24 0232  AST 15  ALT 12  ALKPHOS 84  BILITOT 0.7  PROT 7.0  ALBUMIN 2.6*   No results for input(s): "LIPASE", "AMYLASE" in the last 168 hours. No results for input(s): "AMMONIA" in the last 168 hours. CBC: Recent Labs  Lab 03/02/24 0232  WBC 11.1*  NEUTROABS 9.3*  HGB 8.8*  HCT 28.3*  MCV 86.5  PLT 313   Cardiac Enzymes: No results for input(s): "CKTOTAL", "CKMB", "CKMBINDEX", "TROPONINI" in the last 168 hours. BNP (last 3 results) No results for input(s): "BNP" in the last 8760 hours.  ProBNP (last 3 results) No results for input(s): "PROBNP" in the last 8760 hours.  CBG: Recent Labs  Lab 03/02/24 0355 03/02/24 0356 03/02/24 0542 03/02/24 0629 03/02/24 0749  GLUCAP 440* 455* 468* 392* 287*    Radiological Exams on Admission: CT ABDOMEN PELVIS WO  CONTRAST Result Date: 03/02/2024 CLINICAL DATA:  Abdominal and flank pain.  Bladder infection. EXAM: CT ABDOMEN AND PELVIS WITHOUT CONTRAST TECHNIQUE: Multidetector CT imaging of the abdomen and pelvis was performed following the standard protocol without IV contrast. RADIATION DOSE REDUCTION: This exam was performed according to the departmental dose-optimization program which includes automated exposure control, adjustment of the mA and/or kV according to patient size and/or use of iterative reconstruction technique. COMPARISON:  CT stone study 09/16/2023 FINDINGS: Lower chest: Patchy ground-glass and consolidative opacity in the dependent lung bases is probably atelectatic although superimposed pneumonia is not excluded. No pleural effusion. Hepatobiliary: No suspicious focal abnormality in the liver on this study without intravenous contrast. Cholecystectomy. Common bile duct diameter is upper normal for patient age at 6-7 mm. Pancreas: 8 mm hypodensity is identified in the pancreas near the junction of the body and tail (26/2). This is stable since prior study and comparing back to a study from 01/10/2023. Spleen: No splenomegaly. No suspicious focal mass lesion. Adrenals/Urinary Tract: Similar bilateral adrenal thickening without a discrete nodule or mass. Perinephric stranding is increased compared to the 09/16/2023 exam. No stones are seen in either kidney. Left gonadal vein phleboliths are stable since prior, confirmed on hematuria protocol CT 05/18/2022. The urinary bladder appears normal for the degree of distention. Stomach/Bowel: Stomach is decompressed. Duodenum is normally positioned as is the ligament of Treitz. No small bowel wall thickening. No small bowel dilatation. Terminal ileum not discretely visualized. The appendix is not well visualized, but there is no edema or inflammation in the region of the cecal tip to suggest appendicitis. No gross colonic mass. No colonic wall thickening.  Diverticular changes are noted in the left colon without evidence of diverticulitis. Vascular/Lymphatic: There is moderate atherosclerotic calcification of the abdominal aorta without aneurysm. There is no gastrohepatic or hepatoduodenal ligament lymphadenopathy. No retroperitoneal or mesenteric lymphadenopathy. No pelvic sidewall lymphadenopathy. Reproductive: Hysterectomy.  There is no adnexal mass. Other: No intraperitoneal free fluid. Musculoskeletal: No worrisome lytic or sclerotic osseous abnormality. IMPRESSION: 1. No renal or ureteral stones. No hydronephrosis. 2. Perinephric stranding is increased compared to the 09/16/2023 exam. This is nonspecific and may be related to chronic medical renal disease. Correlation with urinalysis recommended to exclude urinary tract infection. 3. Patchy ground-glass and consolidative opacity in the dependent lung bases is probably atelectatic although superimposed pneumonia is not  excluded. 4. 8 mm hypodensity in the pancreas near the junction of the body and tail. This is stable since prior study and comparing back to a study from 01/10/2023. This is likely a benign etiology such as pseudocyst or small side branch IPMN. Follow-up MRI abdomen with and without contrast recommended in 2 years. This recommendation follows ACR consensus guidelines: Management of Incidental Pancreatic Cysts: A White Paper of the ACR Incidental Findings Committee. J Am Coll Radiol 2017;14:911-923. 5. Left colonic diverticulosis without diverticulitis. 6.  Aortic Atherosclerosis (ICD10-I70.0). Electronically Signed   By: Donnal Fusi M.D.   On: 03/02/2024 05:16   Assessment/Plan [name] is a 89 y.o. female with medical history significant for type 1 diabetes longstanding insulin  dependence, hypertension, hyperlipidemia, hypothyroidism being admitted to the hospital with DKA.   Diabetic ketoacidosis-I see no evidence of acute infection, patient states that her blood sugar started  to rise left AMA when she was unable to do anything to bring it down.  I suspect this may be a user error, or perhaps insulin  pump malfunction. -Observation admission -Monitor closely on stepdown -N.p.o. -IV fluids and insulin  drip per DKA protocol -Monitor progress closely, with every 4 hours BMP -Diabetic educator consult to review insulin  pump use; this instance raises the question of whether the patient is better off continuing to use insulin  pens, which she used well for several years  Hyponatremia-this is pseudohyponatremia, continue fluids and glucose correction as above.  Will monitor sodium levels closely with every 4 hours BMP.  Acute kidney injury on chronic kidney disease stage IIIb-likely due to prerenal azotemia related to dehydration from her DKA.  Continue aggressive hydration per DKA protocol, and monitor renal function daily  Atrial fibrillation-continue home Pacerone , Cardizem , Eliquis   Hypothyroidism-Synthroid   GERD-Protonix   DVT prophylaxis: On Eliquis     Code Status: Full Code  Consults called: None  Admission status: Observation   Time spent: 48 minutes  Karen Gunnoe Rickey Charm MD [facility] Pager [phone]  If 7PM-7AM, please contact night-coverage [email or web address] Password [email or web address]  03/02/2024, 8:39 AM

## 2024-03-02 NOTE — TOC Initial Note (Signed)
 Transition of Care [facility]) - Initial/Assessment Note    Patient Details  Name: Garlene Apperson MRN: [id number] Date of Birth: 08/30/1935  [facility] CM/SW Contact:    Ruben Corolla, RN Phone Number: 03/02/2024, 11:07 AM  Clinical Narrative: d/c plan home.                    Barriers to Discharge: Continued Medical Work up   Patient Goals and CMS Choice Patient states their goals for this hospitalization and ongoing recovery are:: Home CMS Medicare.gov Compare Post Acute Care list provided to:: Patient Choice offered to / list presented to : Patient [place] ownership interest in [facility].provided to:: Patient    Expected Discharge Plan and Services   Discharge Planning Services: CM Consult Post Acute Care Choice:  (Home) Living arrangements for the past 2 months: Single Family Home                                      Prior Living Arrangements/Services Living arrangements for the past 2 months: Single Family Home                Current home services: DME (3n1,rw)    Activities of Daily Living      Permission Sought/Granted                  Emotional Assessment              Admission diagnosis:  AKI (acute kidney injury) (HCC) [N17.9] DKA, type 2 (HCC) [E11.10] Diabetic ketoacidosis without coma associated with type 1 diabetes mellitus (HCC) [E10.10] Patient Active Problem List   Diagnosis Date Noted   DKA, type 2 (HCC) 03/02/2024   Pancreatic cyst 01/13/2024   Pulmonary nodule 01/13/2024   Carcinoma in situ of bladder 01/13/2024   UTI (urinary tract infection) 09/26/2023   Urinary tract infection due to ESBL Klebsiella 09/24/2023   Malnutrition of moderate degree 08/02/2023   Pressure injury of skin 07/31/2023   Pseudohyponatremia 07/31/2023   History of recurrent UTIs 07/31/2023   Acquired hypercoagulable state (HCC) 04/06/2023   Fatty liver 04/06/2023   Malignant neoplasm of urinary bladder (HCC)  04/06/2023   Fall at home, subsequent encounter 05/03/2022   Unsteady gait 05/03/2022   Scalp laceration 05/03/2022   First degree AV block 05/03/2022   Occult blood in stools    Rectal ulcer    Anemia 02/05/2022   Stage 3b chronic kidney disease (CKD) (HCC) 01/27/2022   Atrial fibrillation with rapid ventricular response (HCC) 01/25/2022   Elevated transaminase level 01/25/2022   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   Hypokalemia 09/02/2021   Acute renal failure superimposed on stage 3a chronic kidney disease (HCC) 09/01/2021   COVID-19 virus infection 03/13/2021   DKA (diabetic ketoacidosis) (HCC) 03/13/2021   Hyperglycemia 03/23/2020   LADA (latent autoimmune diabetes in adults), 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   Hypercholesterolemia 03/15/2018   Left hip pain 06/07/2017   Hypothyroidism 02/03/2015   Essential hypertension 07/10/2013   Uncontrolled type 2 diabetes mellitus with hyperglycemia, with long-term current use of insulin  (HCC) 07/10/2013   Upper respiratory infection 07/10/2013   PAF (paroxysmal atrial fibrillation) (HCC) 12/26/2012   Postop check 04/13/2011   PCP:  Imelda Man, MD Pharmacy:   [facility] [phone] - [place], [place] - [address] AT [address] OF [address] [place]  [id number] Phone: [phone] Fax: [phone]  [id number] [place],  - [address] [place] [address] Phone: [phone] Fax: [phone]     Social Drivers of Health (SDOH) Social History: SDOH Screenings   Food Insecurity: No Food Insecurity (09/24/2023)  Housing: Low Risk  (09/24/2023)  Transportation Needs: No Transportation Needs (09/24/2023)  Utilities: Not At Risk (09/24/2023)  Depression (PHQ2-9): Low Risk  (11/30/2022)  Financial Resource Strain: Medium Risk (06/01/2023)  Tobacco Use: Medium Risk (03/02/2024)  Health Literacy: Adequate Health Literacy (08/11/2023)    SDOH Interventions:     Readmission Risk Interventions    08/03/2023    2:12 PM 08/02/2023    5:09 PM 01/27/2022   12:37 PM  Readmission Risk Prevention Plan  Transportation Screening Complete Complete Complete  PCP or Specialist Appt within 3-5 Days   Complete  HRI or Home Care Consult   Complete  Social Work Consult for Recovery Care Planning/Counseling   Complete  Palliative Care Screening   Not Applicable  Medication Review Oceanographer) Complete Referral to Pharmacy Complete  PCP or Specialist appointment within 3-5 days of discharge Complete Complete   HRI or Home Care Consult Complete Complete   SW Recovery Care/Counseling Consult Complete Complete   Palliative Care Screening Not Applicable Not Applicable   Skilled Nursing Facility Not Applicable Not Applicable

## 2024-03-02 NOTE — Inpatient Diabetes Management (Signed)
 Inpatient Diabetes Program Recommendations  AACE/ADA: New Consensus Statement on Inpatient Glycemic Control (2015)  Target Ranges:  Prepandial:   less than 140 mg/dL      Peak postprandial:   less than 180 mg/dL (1-2 hours)      Critically ill patients:  140 - 180 mg/dL   Lab Results  Component Value Date   GLUCAP 211 (H) 03/02/2024   HGBA1C 10.1 (H) 08/10/2023    Review of Glycemic Control  Diabetes history: DM1 Outpatient Diabetes medications: OmniPod 5 with FS Libre Current orders for Inpatient glycemic control: Semglee  6 BID, Novolog  0-15 TID with meals and 0-5 HS + 4 units TID  Basal Rate  Total Basal Dose: 9.6 units/day  Time units/hr  12:00 AM 0.4   Blood Glucose Target  Time mg/dL  16:10 AM 960 - 454   Sensitivity Factor  Time mg/dL/unit  09:81 AM 60   Carb Ratio  Time g/unit  12:00 AM 1    HgbA1C - Needs updatinvgn  Inpatient Diabetes Program Recommendations:    For discharge  Toujeo  5 units BID Novolog  5 units TID at meals + s/s  Sliding Scale per Endo:  Pre-meal blood glucose less than 90: No correctional insulin  Pre-meal blood glucose 91-120: 8 units Pre-meal blood glucose 121-150: 9 units Pre-meal blood glucose 151-200: 10 units Pre-meal blood glucose 201-250: 11 units Pre-meal blood glucose 251-300: 12 units Pre-meal blood glucose 300-350: 13 units Pre-meal blood glucose 351-400: 14 units Pre-meal blood glucose greater than 400: 15 units  Will need prescription for:  FS Libre 2 Reader: [id number] FS Libre Sensor: [phone]  Advised pt to keep pump off until she is able to f/u with her Endo next week.  Spoke with both pt and daughter regarding need to use basal-bolus insulin  after discharage. Discussed hypoglycemia s/s and treatment.  Answered all questions. Discussed above with RN.  Thank you. Joni Net, RD, LDN, CDCES Inpatient Diabetes Coordinator [phone]

## 2024-03-02 NOTE — ED Notes (Signed)
 Patient transported to CT

## 2024-03-02 NOTE — ED Triage Notes (Addendum)
 Arrives [facility] from home with c/o hyperglycemia. T1DM.   Pt says she has had urinary frequency r/t recent bladder infection but knows something is wrong when she goes twice in 30 minutes.   Upon EMS arrival cbg 538 -> 600.   Daughter administered 4u fast acting insulin  as bolus through insulin  pump. Located RLQ.   500cc NS administered by paramedics.

## 2024-03-03 DIAGNOSIS — K219 Gastro-esophageal reflux disease without esophagitis: Secondary | ICD-10-CM | POA: Diagnosis present

## 2024-03-03 DIAGNOSIS — C679 Malignant neoplasm of bladder, unspecified: Secondary | ICD-10-CM | POA: Diagnosis present

## 2024-03-03 DIAGNOSIS — Z860100 Personal history of colon polyps, unspecified: Secondary | ICD-10-CM | POA: Diagnosis not present

## 2024-03-03 DIAGNOSIS — E7800 Pure hypercholesterolemia, unspecified: Secondary | ICD-10-CM | POA: Diagnosis present

## 2024-03-03 DIAGNOSIS — E860 Dehydration: Secondary | ICD-10-CM | POA: Diagnosis present

## 2024-03-03 DIAGNOSIS — N179 Acute kidney failure, unspecified: Secondary | ICD-10-CM | POA: Diagnosis present

## 2024-03-03 DIAGNOSIS — E1022 Type 1 diabetes mellitus with diabetic chronic kidney disease: Secondary | ICD-10-CM | POA: Diagnosis present

## 2024-03-03 DIAGNOSIS — E039 Hypothyroidism, unspecified: Secondary | ICD-10-CM | POA: Diagnosis present

## 2024-03-03 DIAGNOSIS — Z87891 Personal history of nicotine dependence: Secondary | ICD-10-CM | POA: Diagnosis not present

## 2024-03-03 DIAGNOSIS — D631 Anemia in chronic kidney disease: Secondary | ICD-10-CM | POA: Diagnosis present

## 2024-03-03 DIAGNOSIS — E1010 Type 1 diabetes mellitus with ketoacidosis without coma: Principal | ICD-10-CM

## 2024-03-03 DIAGNOSIS — E1051 Type 1 diabetes mellitus with diabetic peripheral angiopathy without gangrene: Secondary | ICD-10-CM | POA: Diagnosis present

## 2024-03-03 DIAGNOSIS — Z8249 Family history of ischemic heart disease and other diseases of the circulatory system: Secondary | ICD-10-CM | POA: Diagnosis not present

## 2024-03-03 DIAGNOSIS — Z794 Long term (current) use of insulin: Secondary | ICD-10-CM

## 2024-03-03 DIAGNOSIS — I129 Hypertensive chronic kidney disease with stage 1 through stage 4 chronic kidney disease, or unspecified chronic kidney disease: Secondary | ICD-10-CM | POA: Diagnosis present

## 2024-03-03 DIAGNOSIS — Z79890 Hormone replacement therapy (postmenopausal): Secondary | ICD-10-CM | POA: Diagnosis not present

## 2024-03-03 DIAGNOSIS — N1831 Chronic kidney disease, stage 3a: Secondary | ICD-10-CM

## 2024-03-03 DIAGNOSIS — Z833 Family history of diabetes mellitus: Secondary | ICD-10-CM | POA: Diagnosis not present

## 2024-03-03 DIAGNOSIS — I480 Paroxysmal atrial fibrillation: Secondary | ICD-10-CM | POA: Diagnosis present

## 2024-03-03 DIAGNOSIS — N1832 Chronic kidney disease, stage 3b: Secondary | ICD-10-CM | POA: Diagnosis present

## 2024-03-03 DIAGNOSIS — Z9641 Presence of insulin pump (external) (internal): Secondary | ICD-10-CM | POA: Diagnosis present

## 2024-03-03 DIAGNOSIS — E871 Hypo-osmolality and hyponatremia: Secondary | ICD-10-CM | POA: Diagnosis present

## 2024-03-03 DIAGNOSIS — Z79899 Other long term (current) drug therapy: Secondary | ICD-10-CM | POA: Diagnosis not present

## 2024-03-03 DIAGNOSIS — Z83438 Family history of other disorder of lipoprotein metabolism and other lipidemia: Secondary | ICD-10-CM | POA: Diagnosis not present

## 2024-03-03 DIAGNOSIS — Z7901 Long term (current) use of anticoagulants: Secondary | ICD-10-CM | POA: Diagnosis not present

## 2024-03-03 LAB — GLUCOSE, CAPILLARY
Glucose-Capillary: 251 mg/dL — ABNORMAL HIGH (ref 70–99)
Glucose-Capillary: 261 mg/dL — ABNORMAL HIGH (ref 70–99)
Glucose-Capillary: 104 mg/dL — ABNORMAL HIGH (ref 70–99)
Glucose-Capillary: 134 mg/dL — ABNORMAL HIGH (ref 70–99)

## 2024-03-03 LAB — BASIC METABOLIC PANEL WITH GFR
Anion gap: 8 (ref 5–15)
BUN: 29 mg/dL — ABNORMAL HIGH (ref 8–23)
CO2: 20 mmol/L — ABNORMAL LOW (ref 22–32)
Calcium: 7.9 mg/dL — ABNORMAL LOW (ref 8.9–10.3)
Chloride: 102 mmol/L (ref 98–111)
Creatinine, Ser: 2.57 mg/dL — ABNORMAL HIGH (ref 0.44–1.00)
GFR, Estimated: 18 mL/min — ABNORMAL LOW (ref >60)
Glucose, Bld: 299 mg/dL — ABNORMAL HIGH (ref 70–99)
Potassium: 4.2 mmol/L (ref 3.5–5.1)
Sodium: 130 mmol/L — ABNORMAL LOW (ref 135–145)

## 2024-03-03 LAB — HEMOGLOBIN A1C
Hgb A1c MFr Bld: 9.6 % — ABNORMAL HIGH (ref 4.8–5.6)
Mean Plasma Glucose: 228.82 mg/dL

## 2024-03-03 LAB — URINE CULTURE: Culture: NO GROWTH

## 2024-03-03 MED ORDER — INSULIN ASPART 100 UNIT/ML IJ SOLN
5.0000 [IU] | Freq: Three times a day (TID) | INTRAMUSCULAR | Status: DC
  Administered 2024-03-03 – 2024-03-04 (×2): 5 [IU] via SUBCUTANEOUS

## 2024-03-03 MED ORDER — LACTATED RINGERS IV SOLN: INTRAVENOUS | Status: AC

## 2024-03-03 MED ORDER — INSULIN GLARGINE-YFGN 100 UNIT/ML ~~LOC~~ SOLN
8.0000 [IU] | Freq: Two times a day (BID) | SUBCUTANEOUS | Status: DC
  Administered 2024-03-03 – 2024-03-04 (×3): 8 [IU] via SUBCUTANEOUS
  Filled 2024-03-03 (×5): qty 0.08

## 2024-03-03 NOTE — Plan of Care (Signed)

## 2024-03-03 NOTE — Progress Notes (Signed)
 [facility]                                                                              [name], is a 89 y.o. female, DOB - Dec 28, 1934, ZOX:[id number] Admit date - 03/02/2024    Outpatient Primary MD for the patient is Imelda Man, MD  LOS - 0  days  Chief Complaint  Patient presents with   Hyperglycemia       Brief summary   Patient is a 89 year old female with diabetes mellitus type 1, longstanding, on insulin  pump, HTN, HLP, hypothyroidism was admitted with DKA. She has been using insulin  pens for years, at the beginning of May she was transition to OmniPod 5. States that this has been working well for her, the goal is to reduce the number of times she has to get up into the bathroom at night, and this has helped. A couple of weeks ago, she was placed on antibiotics for UTI. She also has been followed by Dr. Parke Boll for bladder tumor has had fulguration and BCG multiple times.  She presented to the emergency department where initial blood sugar was 501 and lab work consistent with DKA. She was started on empiric IV antibiotics after urinalysis was obtained. She was placed on IV insulin  drip, IV fluids, and admitted for DKA.     Assessment & Plan      DKA (diabetic ketoacidosis) (HCC)   Uncontrolled type 2 diabetes mellitus with hyperglycemia, with long-term current use of insulin  (HCC) -DKA now resolved, off insulin  drip - Hemoglobin A1c 9.6 - CBGs still elevated CBG (last 3)  Recent Labs    03/02/24 1643 03/02/24 2123 03/03/24 0728  GLUCAP 348* 278* 251*   - Increased semglee  to 8 units twice daily, increased NovoLog  5 units 3 times daily AC, sliding scale insulin   Acute kidney injury superimposed on CKD, stage IIIb - Baseline creatinine 1.0-1.7, last creatinine 1.74 on 02/11/2024, presented with creatinine of 2.64 likely due to severe dehydration, DKA, malignant neoplasm of urinary bladder -CT abdomen pelvis showed no renal or ureteral stones, no  hydronephrosis, perinephric stranding, UA negative for UTI - Continue IV fluids, follow renal function    Essential hypertension - BP stable, continue Cardizem     PAF (paroxysmal atrial fibrillation) (HCC) -Heart rate controlled, continue Cardizem , amiodarone  - Continue Eliquis     Hypothyroidism - Continue Synthroid     Malignant neoplasm of urinary bladder (HCC) - Outpatient follow-up with urology - followed by Dr. Parke Boll for bladder tumor has had fulguration and BCG multiple times.     Pseudohyponatremia - Due to hyperglycemia  Chronic normocytic anemia - Likely due to anemia of chronic disease, CKD - No obvious bleeding   Estimated body mass index is 22.86 kg/m as calculated from the following:   Height as of this encounter: 5\' 2"  (1.575 m).   Weight as of this encounter: 56.7 kg.  Code Status: Full CODE STATUS DVT Prophylaxis:  apixaban  (ELIQUIS ) tablet 2.5 mg Start: 03/02/24 1000 apixaban  (ELIQUIS ) tablet 2.5 mg   Level of Care: Level of care: Telemetry Family Communication: Updated patient Disposition Plan:      Remains inpatient  appropriate: Workup in progress, transfer to telemetry floor   Procedures:    Consultants:     Antimicrobials:   Anti-infectives (From admission, onward)    Start     Dose/Rate Route Frequency Ordered Stop   03/03/24 0600  cefTRIAXone  (ROCEPHIN ) 1 g in sodium chloride  0.9 % 100 mL IVPB  Status:  Discontinued        1 g 200 mL/hr over 30 Minutes Intravenous Every 24 hours 03/02/24 0837 03/02/24 0858   03/02/24 0445  cefTRIAXone  (ROCEPHIN ) 1 g in sodium chloride  0.9 % 100 mL IVPB        1 g 200 mL/hr over 30 Minutes Intravenous  Once 03/02/24 0433 03/02/24 0540          Medications  amiodarone   100 mg Oral Daily   apixaban   2.5 mg Oral BID   Chlorhexidine  Gluconate Cloth  6 each Topical Daily   cholecalciferol  1,000 Units Oral Daily   diltiazem   180 mg Oral q AM   insulin  aspart  0-15 Units Subcutaneous TID WC   insulin   aspart  0-5 Units Subcutaneous QHS   insulin  aspart  4 Units Subcutaneous TID WC   insulin  glargine-yfgn  8 Units Subcutaneous BID   levothyroxine   112 mcg Oral QAC breakfast   pantoprazole   40 mg Oral Daily      Subjective:   [name] was seen and examined today.  No acute complaints, feeling better.  Creatinine still 2.5.  CBGs improving however still in high 200s.  Patient denies dizziness, chest pain, shortness of breath, abdominal pain, N/V/D/C.  Off the insulin  drip. Objective:   Vitals:   03/03/24 0400 03/03/24 0500 03/03/24 0600 03/03/24 0725  BP: (!) 144/46     Pulse: 67 68 66   Resp: (!) 23 (!) 23 (!) 23   Temp:    98.4 F (36.9 C)  TempSrc:    Oral  SpO2: 90% 90% 91%   Weight:      Height:        Intake/Output Summary (Last 24 hours) at 03/03/2024 0929 Last data filed at 03/03/2024 0608 Gross per 24 hour  Intake 2347.26 ml  Output 600 ml  Net 1747.26 ml     Wt Readings from Last 3 Encounters:  03/02/24 56.7 kg  02/11/24 57.6 kg  01/23/24 57.6 kg     Exam General: Alert and oriented x 3, NAD Cardiovascular: S1 S2 auscultated,  RRR Respiratory: Clear to auscultation bilaterally, no wheezing Gastrointestinal: Soft, nontender, nondistended, + bowel sounds Ext: no pedal edema bilaterally Neuro: no new deficits Psych: Normal affect     Data Reviewed:  I have personally reviewed following labs    CBC Lab Results  Component Value Date   WBC 11.1 (H) 03/02/2024   RBC 3.27 (L) 03/02/2024   HGB 8.8 (L) 03/02/2024   HCT 28.3 (L) 03/02/2024   MCV 86.5 03/02/2024   MCH 26.9 03/02/2024   PLT 313 03/02/2024   MCHC 31.1 03/02/2024   RDW 13.9 03/02/2024   LYMPHSABS 0.8 03/02/2024   MONOABS 0.8 03/02/2024   EOSABS 0.1 03/02/2024   BASOSABS 0.1 03/02/2024     Last metabolic panel Lab Results  Component Value Date   NA 130 (L) 03/03/2024   K 4.2 03/03/2024   CL 102 03/03/2024   CO2 20 (L) 03/03/2024   BUN 29 (H) 03/03/2024   CREATININE 2.57  (H) 03/03/2024   GLUCOSE 299 (H) 03/03/2024   GFRNONAA 18 (L) 03/03/2024  GFRAA 61 04/16/2020   CALCIUM  7.9 (L) 03/03/2024   PHOS 2.6 09/24/2023   PROT 7.0 03/02/2024   ALBUMIN 2.6 (L) 03/02/2024   LABGLOB 3.2 04/05/2022   AGRATIO 1.0 04/05/2022   BILITOT 0.7 03/02/2024   ALKPHOS 84 03/02/2024   AST 15 03/02/2024   ALT 12 03/02/2024   ANIONGAP 8 03/03/2024    CBG (last 3)  Recent Labs    03/02/24 1643 03/02/24 2123 03/03/24 0728  GLUCAP 348* 278* 251*      Coagulation Profile: No results for input(s): "INR", "PROTIME" in the last 168 hours.   Radiology Studies: I have personally reviewed the imaging studies  CT ABDOMEN PELVIS WO CONTRAST Result Date: 03/02/2024 CLINICAL DATA:  Abdominal and flank pain.  Bladder infection. EXAM: CT ABDOMEN AND PELVIS WITHOUT CONTRAST TECHNIQUE: Multidetector CT imaging of the abdomen and pelvis was performed following the standard protocol without IV contrast. RADIATION DOSE REDUCTION: This exam was performed according to the departmental dose-optimization program which includes automated exposure control, adjustment of the mA and/or kV according to patient size and/or use of iterative reconstruction technique. COMPARISON:  CT stone study 09/16/2023 FINDINGS: Lower chest: Patchy ground-glass and consolidative opacity in the dependent lung bases is probably atelectatic although superimposed pneumonia is not excluded. No pleural effusion. Hepatobiliary: No suspicious focal abnormality in the liver on this study without intravenous contrast. Cholecystectomy. Common bile duct diameter is upper normal for patient age at 6-7 mm. Pancreas: 8 mm hypodensity is identified in the pancreas near the junction of the body and tail (26/2). This is stable since prior study and comparing back to a study from 01/10/2023. Spleen: No splenomegaly. No suspicious focal mass lesion. Adrenals/Urinary Tract: Similar bilateral adrenal thickening without a discrete nodule or  mass. Perinephric stranding is increased compared to the 09/16/2023 exam. No stones are seen in either kidney. Left gonadal vein phleboliths are stable since prior, confirmed on hematuria protocol CT 05/18/2022. The urinary bladder appears normal for the degree of distention. Stomach/Bowel: Stomach is decompressed. Duodenum is normally positioned as is the ligament of Treitz. No small bowel wall thickening. No small bowel dilatation. Terminal ileum not discretely visualized. The appendix is not well visualized, but there is no edema or inflammation in the region of the cecal tip to suggest appendicitis. No gross colonic mass. No colonic wall thickening. Diverticular changes are noted in the left colon without evidence of diverticulitis. Vascular/Lymphatic: There is moderate atherosclerotic calcification of the abdominal aorta without aneurysm. There is no gastrohepatic or hepatoduodenal ligament lymphadenopathy. No retroperitoneal or mesenteric lymphadenopathy. No pelvic sidewall lymphadenopathy. Reproductive: Hysterectomy.  There is no adnexal mass. Other: No intraperitoneal free fluid. Musculoskeletal: No worrisome lytic or sclerotic osseous abnormality. IMPRESSION: 1. No renal or ureteral stones. No hydronephrosis. 2. Perinephric stranding is increased compared to the 09/16/2023 exam. This is nonspecific and may be related to chronic medical renal disease. Correlation with urinalysis recommended to exclude urinary tract infection. 3. Patchy ground-glass and consolidative opacity in the dependent lung bases is probably atelectatic although superimposed pneumonia is not excluded. 4. 8 mm hypodensity in the pancreas near the junction of the body and tail. This is stable since prior study and comparing back to a study from 01/10/2023. This is likely a benign etiology such as pseudocyst or small side branch IPMN. Follow-up MRI abdomen with and without contrast recommended in 2 years. This recommendation follows ACR  consensus guidelines: Management of Incidental Pancreatic Cysts: A White Paper of the ACR Incidental Findings Committee. J Am  Coll Radiol 2017;14:[id number]. 5. Left colonic diverticulosis without diverticulitis. 6.  Aortic Atherosclerosis (ICD10-I70.0). Electronically Signed   By: Donnal Fusi M.D.   On: 03/02/2024 05:16       Rogers Ditter M.D. [facility] 03/03/2024, 9:29 AM  Available via Epic secure chat 7am-7pm After 7 pm, please refer to night coverage provider listed on amion.

## 2024-03-04 DIAGNOSIS — N179 Acute kidney failure, unspecified: Secondary | ICD-10-CM | POA: Diagnosis not present

## 2024-03-04 DIAGNOSIS — E1010 Type 1 diabetes mellitus with ketoacidosis without coma: Secondary | ICD-10-CM | POA: Diagnosis not present

## 2024-03-04 LAB — BASIC METABOLIC PANEL WITH GFR
Anion gap: 7 (ref 5–15)
BUN: 29 mg/dL — ABNORMAL HIGH (ref 8–23)
CO2: 19 mmol/L — ABNORMAL LOW (ref 22–32)
Calcium: 7.5 mg/dL — ABNORMAL LOW (ref 8.9–10.3)
Chloride: 104 mmol/L (ref 98–111)
Creatinine, Ser: 2.51 mg/dL — ABNORMAL HIGH (ref 0.44–1.00)
GFR, Estimated: 18 mL/min — ABNORMAL LOW (ref >60)
Glucose, Bld: 198 mg/dL — ABNORMAL HIGH (ref 70–99)
Potassium: 3.7 mmol/L (ref 3.5–5.1)
Sodium: 130 mmol/L — ABNORMAL LOW (ref 135–145)

## 2024-03-04 LAB — GLUCOSE, CAPILLARY
Glucose-Capillary: 198 mg/dL — ABNORMAL HIGH (ref 70–99)
Glucose-Capillary: 190 mg/dL — ABNORMAL HIGH (ref 70–99)

## 2024-03-04 MED ORDER — ONDANSETRON HCL 4 MG PO TABS: 4.0000 mg | ORAL_TABLET | Freq: Three times a day (TID) | ORAL | 0 refills | Status: AC | PRN

## 2024-03-04 MED ORDER — PEN NEEDLES 31G X 5 MM MISC: 1.0000 | Freq: Three times a day (TID) | 0 refills | Status: AC

## 2024-03-04 MED ORDER — INSULIN DEGLUDEC 100 UNIT/ML ~~LOC~~ SOPN: 16.0000 [IU] | PEN_INJECTOR | Freq: Every day | SUBCUTANEOUS | 2 refills | Status: AC

## 2024-03-04 MED ORDER — INSULIN ASPART 100 UNIT/ML FLEXPEN: 8.0000 [IU] | PEN_INJECTOR | Freq: Three times a day (TID) | SUBCUTANEOUS | 0 refills | Status: AC

## 2024-03-04 MED ORDER — FREESTYLE LIBRE 3 SENSOR MISC: 8.0000 [IU] | Freq: Three times a day (TID) | 1 refills | Status: AC

## 2024-03-04 MED ORDER — BLOOD GLUCOSE MONITORING SUPPL DEVI: 1.0000 | Freq: Three times a day (TID) | 0 refills | Status: AC

## 2024-03-04 MED ORDER — LANCETS MISC: 1.0000 | Freq: Three times a day (TID) | 0 refills | Status: AC

## 2024-03-04 MED ORDER — BLOOD GLUCOSE TEST VI STRP: 1.0000 | ORAL_STRIP | Freq: Three times a day (TID) | 0 refills | Status: AC

## 2024-03-04 MED ORDER — LANCET DEVICE MISC: 1.0000 | Freq: Three times a day (TID) | 0 refills | Status: AC

## 2024-03-04 NOTE — Evaluation (Signed)
 Physical Therapy Evaluation Patient Details Name: [name] MRN: [id number] DOB: 1935-08-01 Today's Date: 03/04/2024  History of Present Illness  89 year old female with diabetes mellitus type 1, longstanding, on insulin  pump, HTN, HLP, hypothyroidism was admitted with DKA.  Clinical Impression  Patient evaluated by Physical Therapy with no further acute PT needs identified. All education has been completed and the patient has no further questions.  Pt with improved gait stability with use of RW, amb 300' with/without device. Encouraged use of rollator initially at home for safety and increasing activity slowly. Pt is very active and independent at baseline. Pt does not feel she needs additional PT post acute,PT in agreement. Dtr present for session.  See below for any follow-up Physical Therapy or equipment needs. PT is signing off. Thank you for this referral.         If plan is discharge home, recommend the following: Assistance with cooking/housework;Assist for transportation;Help with stairs or ramp for entrance   Can travel by private vehicle        Equipment Recommendations None recommended by PT  Recommendations for Other Services       Functional Status Assessment Patient has not had a recent decline in their functional status     Precautions / Restrictions Precautions Precautions: Fall Restrictions Weight Bearing Restrictions Per Provider Order: No      Mobility  Bed Mobility Overal bed mobility: Modified Independent                  Transfers Overall transfer level: Modified independent                 General transfer comment: supervision for safety intially, fade to mod I for STS, no physical assist    Ambulation/Gait Ambulation/Gait assistance: Supervision, Contact guard assist Gait Distance (Feet): 300 Feet Assistive device: Rolling walker (2 wheels), None Gait Pattern/deviations: Step-through pattern       General Gait  Details: amb 150' with RW-supervision for safety and line management, 150' without device and CGA. pt with improved stability with device, no overt LOB during 300'; uses rollator as needed at baseline  Stairs            Wheelchair Mobility     Tilt Bed    Modified Rankin (Stroke Patients Only)       Balance Overall balance assessment: Needs assistance Sitting-balance support: No upper extremity supported, Feet supported Sitting balance-Leahy Scale: Normal     Standing balance support: No upper extremity supported, During functional activity, Reliant on assistive device for balance Standing balance-Leahy Scale: Fair Standing balance comment: Fair+. able to amb without device, unable to tolerate mod challenges without UE support             High level balance activites: Turns, Head turns, Direction changes High Level Balance Comments: no LOB             Pertinent Vitals/Pain Pain Assessment Pain Assessment: No/denies pain    Home Living Family/patient expects to be discharged to:: Private residence Living Arrangements: Alone Available Help at Discharge: Family Type of Home: House Home Access: Level entry       Home Layout: Laundry or work area in basement Home Equipment: Rollator (4 wheels);BSC/3in1 Additional Comments: Has basement and sometimes does stairs but frequently drives car around to back of house where she has level entry basement access    Prior Function Prior Level of Function : Independent/Modified Independent;Driving;Working/employed  Mobility Comments: She occasionally use a rollator for ambulation. dtr checks on pt regularly, assists if needed. pt reports grand-dtr comes and stays for the wknd about once a month as well ADLs Comments: She was independent with ADLs, driving, cooking light meals, and cleaning.     Extremity/Trunk Assessment   Upper Extremity Assessment Upper Extremity Assessment: Overall WFL for tasks  assessed    Lower Extremity Assessment Lower Extremity Assessment: Overall WFL for tasks assessed       Communication   Communication Communication: No apparent difficulties    Cognition Arousal: Alert Behavior During Therapy: WFL for tasks assessed/performed   PT - Cognitive impairments: No apparent impairments                         Following commands: Intact       Cueing Cueing Techniques: Verbal cues     General Comments      Exercises     Assessment/Plan    PT Assessment Patient does not need any further PT services  PT Problem List         PT Treatment Interventions      PT Goals (Current goals can be found in the Care Plan section)  Acute Rehab PT Goals PT Goal Formulation: All assessment and education complete, DC therapy    Frequency       Co-evaluation               AM-PAC PT "6 Clicks" Mobility  Outcome Measure Help needed turning from your back to your side while in a flat bed without using bedrails?: None Help needed moving from lying on your back to sitting on the side of a flat bed without using bedrails?: None Help needed moving to and from a bed to a chair (including a wheelchair)?: None Help needed standing up from a chair using your arms (e.g., wheelchair or bedside chair)?: None Help needed to walk in hospital room?: A Little Help needed climbing 3-5 steps with a railing? : A Little 6 Click Score: 22    End of Session Equipment Utilized During Treatment: Gait belt Activity Tolerance: Patient tolerated treatment well Patient left: with call bell/phone within reach;in bed;with family/visitor present   PT Visit Diagnosis: Other abnormalities of gait and mobility (R26.89)    Time: 1209-1229 PT Time Calculation (min) (ACUTE ONLY): 20 min   Charges:   PT Evaluation $PT Eval Low Complexity: 1 Low   PT General Charges $$ ACUTE PT VISIT: 1 Visit         Stratton Villwock, PT  Acute Rehab Dept [facility])  [phone]  03/04/2024   [facility] 03/04/2024, 12:46 PM

## 2024-03-04 NOTE — Inpatient Diabetes Management (Addendum)
 Inpatient Diabetes Program Recommendations  AACE/ADA: New Consensus Statement on Inpatient Glycemic Control (2015)  Target Ranges:  Prepandial:   less than 140 mg/dL      Peak postprandial:   less than 180 mg/dL (1-2 hours)      Critically ill patients:  140 - 180 mg/dL   Lab Results  Component Value Date   GLUCAP 190 (H) 03/04/2024   HGBA1C 9.6 (H) 03/02/2024     Discharge Recommendations: Long acting recommendations: Insulin  Glargine (TOUJEO ) 300 Units/mL Solostar Pen 12 units QHS  Short acting recommendations:  Meal + Correction coverage Insulin  aspart (NOVOLOG ) FlexPen  Moderate Scale.  5 + home sliding scale (see progress note) Supply/Referral recommendations: Pen needles - standard   Use Adult Diabetes Insulin  Treatment Post Discharge order set. Paged by Dr Thelma Fire. Discharge recommendations above per DM coordinator note from 5/23.   Addendum 1120:  Discharge recommendations updated-  Toujeo  8 units once at bedtime, then 16 units every day to follow. Novolog  8 units TID Add Freestyle libre 3 sensors.   Patient to follow up with endo this week. Discussed with Sherline Distel and Dr Thelma Fire.   Thanks, Marjo Sievert, MSN, RNC-OB Diabetes Coordinator [phone] (8a-5p)

## 2024-03-04 NOTE — Discharge Summary (Signed)
 Physician Discharge Summary   Patient: [name] MRN: [id number] DOB: 12-31-1934  Admit date:     03/02/2024  Discharge date: 03/04/24  Discharge Physician: Bertram Brocks, MD    PCP: Imelda Man, MD   Recommendations at discharge:   Placed on Tresiba  16 units subcu daily starting tomorrow NovoLog  FlexPen 8 units 3 times daily [facility] Follow-up outpatient with endocrinology/PCP in 1 week, repeat labs, BMET  Discharge Diagnoses:    DKA (diabetic ketoacidosis) (HCC)   Anemia   Uncontrolled type 2 diabetes mellitus with hyperglycemia, with long-term current use of insulin  (HCC)   Essential hypertension   Acute renal failure superimposed on stage 3a chronic kidney disease (HCC)   PAF (paroxysmal atrial fibrillation) (HCC)   Hypercholesterolemia   Hypothyroidism   Stage 3b chronic kidney disease (CKD) (HCC)   Malignant neoplasm of urinary bladder [facility])   Pseudohyponatremia    Hospital Course:  Patient is a 89 year old female with diabetes mellitus type 1, longstanding, on insulin  pump, HTN, HLP, hypothyroidism was admitted with DKA. She has been using insulin  pens for years, at the beginning of May she was transition to OmniPod 5. States that this has been working well for her, the goal is to reduce the number of times she has to get up into the bathroom at night, and this has helped. A couple of weeks ago, she was placed on antibiotics for UTI. She also has been followed by Dr. Parke Boll for bladder tumor has had fulguration and BCG multiple times.  She presented to the emergency department where initial blood sugar was 501 and lab work consistent with DKA. She was started on empiric IV antibiotics after urinalysis was obtained. She was placed on IV insulin  drip, IV fluids, and admitted for DKA.    Assessment and Plan:   DKA (diabetic ketoacidosis) (HCC)   Uncontrolled type 2 diabetes mellitus with hyperglycemia, with long-term current use of insulin  (HCC) -DKA now resolved, off  insulin  drip - Hemoglobin A1c 9.6 - Insulin  pump currently off, patient will follow-up with her endocrinologist. Placed on Tresiba  16 units daily, NovoLog  meal coverage 8 units 3 times daily AC  Acute kidney injury superimposed on CKD, stage IIIb - Baseline creatinine 1.0-1.7, last creatinine 1.74 on 02/11/2024, presented with creatinine of 2.64 likely due to severe dehydration, DKA, malignant neoplasm of urinary bladder -CT abdomen pelvis showed no renal or ureteral stones, no hydronephrosis, perinephric stranding, UA negative for UTI - Creatinine plateaued and stable at 2.5 received IV fluid hydration    Essential hypertension - BP stable, continue Cardizem      PAF (paroxysmal atrial fibrillation) (HCC) -Heart rate controlled, continue Cardizem , amiodarone  - Continue Eliquis      Hypothyroidism - Continue Synthroid      Malignant neoplasm of urinary bladder [facility]) - Outpatient follow-up with urology - followed by Dr. Parke Boll for bladder tumor has had fulguration and BCG multiple times.      Pseudohyponatremia - Due to hyperglycemia   Chronic normocytic anemia - Likely due to anemia of chronic disease, CKD - No obvious bleeding     Estimated body mass index is 22.86 kg/m as calculated from the following:   Height as of this encounter: 5\' 2"  (1.575 m).   Weight as of this encounter: 56.7 kg.     Pain control - [place]  Controlled Substance Reporting System database was reviewed. and patient was instructed, not to drive, operate heavy machinery, perform activities at heights, swimming or participation in water  activities or provide baby-sitting services while  on Pain, Sleep and Anxiety Medications; until their outpatient Physician has advised to do so again. Also recommended to not to take more than prescribed Pain, Sleep and Anxiety Medications.  Consultants:  Procedures performed:   Disposition: Home Diet recommendation:  Discharge Diet Orders (From admission, onward)      Start     Ordered   03/04/24 0000  Diet Carb Modified        03/04/24 1123            DISCHARGE MEDICATION: Allergies as of 03/04/2024   No Known Allergies      Medication List     STOP taking these medications    Toujeo  Max SoloStar 300 UNIT/ML Solostar Pen Generic drug: insulin  glargine (2 Unit Dial )       TAKE these medications    amiodarone  100 MG tablet Commonly known as: PACERONE  Take 1 tablet (100 mg total) by mouth daily.   Blood Glucose Monitoring Suppl Devi 1 each by Does not apply route 3 (three) times daily. May dispense any manufacturer covered by patient's insurance.   BLOOD GLUCOSE TEST STRIPS Strp 1 each by Does not apply route 3 (three) times daily. Use as directed to check blood sugar. May dispense any manufacturer covered by patient's insurance and fits patient's device.   cholecalciferol 25 MCG (1000 UNIT) tablet Commonly known as: VITAMIN D3 Take 1,000 Units by mouth daily.   docusate sodium  100 MG capsule Commonly known as: COLACE Take 1 capsule (100 mg total) by mouth every 12 (twelve) hours.   Eliquis  2.5 MG Tabs tablet Generic drug: apixaban  TAKE 1 TABLET BY MOUTH TWICE A DAY   FreeStyle Libre 3 Sensor Misc 8 Units by Does not apply route 3 (three) times daily. Place 1 sensor on the skin every 14 days. Use to check glucose continuously   furosemide  20 MG tablet Commonly known as: LASIX  TAKE 1 TABLET (20 MG TOTAL) BY MOUTH DAILY AS NEEDED FOR FLUID OR EDEMA.   insulin  aspart 100 UNIT/ML FlexPen Commonly known as: NOVOLOG  Inject 8 Units into the skin 3 (three) times daily with meals. Only take if eating a meal AND Blood Glucose (BG) is 80 or higher. What changed: additional instructions   insulin  degludec 100 UNIT/ML FlexTouch Pen Commonly known as: TRESIBA  Inject 16 Units into the skin daily. May substitute as needed per insurance. Start taking on: Mar 05, 2024   Lancet Device Misc 1 each by Does not apply route 3 (three)  times daily. May dispense any manufacturer covered by patient's insurance.   Lancets Misc 1 each by Does not apply route 3 (three) times daily. Use as directed to check blood sugar. May dispense any manufacturer covered by patient's insurance and fits patient's device.   levothyroxine  112 MCG tablet Commonly known as: SYNTHROID  Take 112 mcg by mouth daily before breakfast.   mirtazapine  30 MG tablet Commonly known as: REMERON  Take 30 mg by mouth at bedtime as needed (sleep).   ondansetron  4 MG tablet Commonly known as: ZOFRAN  Take 1 tablet (4 mg total) by mouth every 8 (eight) hours as needed for nausea or vomiting.   pantoprazole  40 MG tablet Commonly known as: PROTONIX  Take 40 mg by mouth daily.   Pen Needles 31G X 5 MM Misc 1 each by Does not apply route 3 (three) times daily. May dispense any manufacturer covered by patient's insurance.   Tiadylt  ER 180 MG 24 hr capsule Generic drug: diltiazem  TAKE 1 CAPSULE BY MOUTH EVERY DAY What  changed:  how much to take when to take this        Follow-up Information     Imelda Man, MD. Schedule an appointment as soon as possible for a visit in 2 week(s).   Specialty: Internal Medicine Why: for hospital follow-up Contact information: [address] [place] [address] [phone]         Arcelia Knudsen, DO. Schedule an appointment as soon as possible for a visit in 1 week(s).   Specialty: Endocrinology Why: for hospital follow-up Contact information: [address] [place] [address] [phone]                Discharge Exam: Cleavon Curls Weights   03/02/24 0207  Weight: 56.7 kg   S: No acute complaints, feeling better now, cleared for discharge home  BP (!) 143/51 (BP Location: Right Arm)   Pulse 68   Temp 98.8 F (37.1 C) (Oral)   Resp (!) 23   Ht 5\' 2"  (1.575 m)   Wt 56.7 kg   SpO2 96%   BMI 22.86 kg/m   Physical Exam General: Alert and oriented x 3,  NAD Cardiovascular: S1 S2 clear, RRR.  Respiratory: CTAB, no wheezing, rales or rhonchi Gastrointestinal: Soft, nontender, nondistended, NBS Ext: no pedal edema bilaterally Neuro: no new deficits Psych: Normal affect    Condition at discharge: fair  The results of significant diagnostics from this hospitalization (including imaging, microbiology, ancillary and laboratory) are listed below for reference.   Imaging Studies: CT ABDOMEN PELVIS WO CONTRAST Result Date: 03/02/2024 CLINICAL DATA:  Abdominal and flank pain.  Bladder infection. EXAM: CT ABDOMEN AND PELVIS WITHOUT CONTRAST TECHNIQUE: Multidetector CT imaging of the abdomen and pelvis was performed following the standard protocol without IV contrast. RADIATION DOSE REDUCTION: This exam was performed according to the departmental dose-optimization program which includes automated exposure control, adjustment of the mA and/or kV according to patient size and/or use of iterative reconstruction technique. COMPARISON:  CT stone study 09/16/2023 FINDINGS: Lower chest: Patchy ground-glass and consolidative opacity in the dependent lung bases is probably atelectatic although superimposed pneumonia is not excluded. No pleural effusion. Hepatobiliary: No suspicious focal abnormality in the liver on this study without intravenous contrast. Cholecystectomy. Common bile duct diameter is upper normal for patient age at 6-7 mm. Pancreas: 8 mm hypodensity is identified in the pancreas near the junction of the body and tail (26/2). This is stable since prior study and comparing back to a study from 01/10/2023. Spleen: No splenomegaly. No suspicious focal mass lesion. Adrenals/Urinary Tract: Similar bilateral adrenal thickening without a discrete nodule or mass. Perinephric stranding is increased compared to the 09/16/2023 exam. No stones are seen in either kidney. Left gonadal vein phleboliths are stable since prior, confirmed on hematuria protocol CT  05/18/2022. The urinary bladder appears normal for the degree of distention. Stomach/Bowel: Stomach is decompressed. Duodenum is normally positioned as is the ligament of Treitz. No small bowel wall thickening. No small bowel dilatation. Terminal ileum not discretely visualized. The appendix is not well visualized, but there is no edema or inflammation in the region of the cecal tip to suggest appendicitis. No gross colonic mass. No colonic wall thickening. Diverticular changes are noted in the left colon without evidence of diverticulitis. Vascular/Lymphatic: There is moderate atherosclerotic calcification of the abdominal aorta without aneurysm. There is no gastrohepatic or hepatoduodenal ligament lymphadenopathy. No retroperitoneal or mesenteric lymphadenopathy. No pelvic sidewall lymphadenopathy. Reproductive: Hysterectomy.  There is no adnexal mass. Other: No  intraperitoneal free fluid. Musculoskeletal: No worrisome lytic or sclerotic osseous abnormality. IMPRESSION: 1. No renal or ureteral stones. No hydronephrosis. 2. Perinephric stranding is increased compared to the 09/16/2023 exam. This is nonspecific and may be related to chronic medical renal disease. Correlation with urinalysis recommended to exclude urinary tract infection. 3. Patchy ground-glass and consolidative opacity in the dependent lung bases is probably atelectatic although superimposed pneumonia is not excluded. 4. 8 mm hypodensity in the pancreas near the junction of the body and tail. This is stable since prior study and comparing back to a study from 01/10/2023. This is likely a benign etiology such as pseudocyst or small side branch IPMN. Follow-up MRI abdomen with and without contrast recommended in 2 years. This recommendation follows ACR consensus guidelines: Management of Incidental Pancreatic Cysts: A White Paper of the ACR Incidental Findings Committee. J Am Coll Radiol 2017;14:911-923. 5. Left colonic diverticulosis without  diverticulitis. 6.  Aortic Atherosclerosis (ICD10-I70.0). Electronically Signed   By: Donnal Fusi M.D.   On: 03/02/2024 05:16    Microbiology: Results for orders placed or performed during the hospital encounter of 03/02/24  Urine Culture     Status: None   Collection Time: 03/02/24  2:17 AM   Specimen: Urine, Clean Catch  Result Value Ref Range Status   Specimen Description   Final    URINE, CLEAN CATCH Performed at [facility], [address], [place], [place] [address]    Special Requests   Final    NONE Reflexed from [id number] Performed at [facility], [address] [place], [place] [address]    Culture   Final    NO GROWTH Performed at [facility], [address] [place], [place] [address]    Report Status 03/03/2024 FINAL  Final  MRSA Next Gen by PCR, Nasal     Status: None   Collection Time: 03/02/24  7:49 AM   Specimen: Nasal Mucosa; Nasal Swab  Result Value Ref Range Status   MRSA by PCR Next Gen NOT DETECTED NOT DETECTED Final    Comment: (NOTE) The GeneXpert MRSA Assay (FDA approved for NASAL specimens only), is one component of a comprehensive MRSA colonization surveillance program. It is not intended to diagnose MRSA infection nor to guide or monitor treatment for MRSA infections. Test performance is not FDA approved in patients less than 35 years old. Performed at [facility], [address] [place], [place] [address]     Labs: CBC: Recent Labs  Lab 03/02/24 0232  WBC 11.1*  NEUTROABS 9.3*  HGB 8.8*  HCT 28.3*  MCV 86.5  PLT 313   Basic Metabolic Panel: Recent Labs  Lab 03/02/24 0232 03/02/24 0846 03/02/24 1150 03/03/24 0304 03/04/24 0639  NA 128* 132* 139 130* 130*  K 4.7 3.7 3.8 4.2 3.7  CL 97* 103 108 102 104  CO2 18* 15* 19* 20* 19*  GLUCOSE 501* 288* 76 299* 198*  BUN 30* 29* 28* 29* 29*  CREATININE 2.64* 2.51* 2.55* 2.57* 2.51*  CALCIUM  8.0* 8.0* 8.2* 7.9* 7.5*   Liver  Function Tests: Recent Labs  Lab 03/02/24 0232  AST 15  ALT 12  ALKPHOS 84  BILITOT 0.7  PROT 7.0  ALBUMIN 2.6*   CBG: Recent Labs  Lab 03/03/24 1124 03/03/24 1639 03/03/24 2055 03/04/24 0547 03/04/24 0758  GLUCAP 261* 104* 134* 198* 190*    Discharge time spent: greater than 30 minutes.  Signed: Bertram Brocks, MD [facility] 03/04/2024

## 2024-03-04 NOTE — TOC Transition Note (Signed)
 Transition of Care [facility]) - Discharge Note   Patient Details  Name: [name] MRN: [id number] Date of Birth: 11-29-1934  Transition of Care [facility]) CM/SW Contact:  Levie Ream, RN Phone Number: 03/04/2024, 12:42 PM   Clinical Narrative:    D/C orders received; notified by Alexandria Ida, PT that no recc needed; no TOC needs.   Final next level of care: Home/Self Care Barriers to Discharge: No Barriers Identified   Patient Goals and CMS Choice Patient states their goals for this hospitalization and ongoing recovery are:: Home CMS Medicare.gov Compare Post Acute Care list provided to:: Patient Choice offered to / list presented to : [name] ownership interest in [facility].provided to:: Patient    Discharge Placement                       Discharge Plan and Services Additional resources added to the After Visit Summary for     Discharge Planning Services: CM Consult Post Acute Care Choice:  (Home)                               Social Drivers of Health (SDOH) Interventions SDOH Screenings   Food Insecurity: Patient Declined (03/02/2024)  Housing: Low Risk  (03/02/2024)  Transportation Needs: No Transportation Needs (03/02/2024)  Utilities: Not At Risk (03/02/2024)  Depression (PHQ2-9): Low Risk  (11/30/2022)  Financial Resource Strain: Medium Risk (06/01/2023)  Social Connections: Unknown (03/02/2024)  Tobacco Use: Medium Risk (03/02/2024)  Health Literacy: Adequate Health Literacy (08/11/2023)     Readmission Risk Interventions    08/03/2023    2:12 PM 08/02/2023    5:09 PM 01/27/2022   12:37 PM  Readmission Risk Prevention Plan  Transportation Screening Complete Complete Complete  PCP or Specialist Appt within 3-5 Days   Complete  HRI or Home Care Consult   Complete  Social Work Consult for Recovery Care Planning/Counseling   Complete  Palliative Care Screening   Not Applicable  Medication Review [facility])  Complete Referral to Pharmacy Complete  PCP or Specialist appointment within 3-5 days of discharge Complete Complete   HRI or Home Care Consult Complete Complete   SW Recovery Care/Counseling Consult Complete Complete   Palliative Care Screening Not Applicable Not Applicable   Skilled Nursing Facility Not Applicable Not Applicable

## 2024-03-06 NOTE — Transitions of Care (Post Inpatient/ED Visit) (Signed)
 03/06/2024  Patient ID: [name], female   DOB: 1935-01-04, 89 y.o.   MRN: [id number]   Medication Review for transitions of care.  See [employer] for  documentation.   Estelle Greenleaf J. Chasen Mendell RN, MSN [place]  [facility], [facility] RN Care Manager Direct Dial : [phone]  Fax: [phone] Website: [email or web address]

## 2024-03-07 DIAGNOSIS — E1065 Type 1 diabetes mellitus with hyperglycemia: Secondary | ICD-10-CM | POA: Diagnosis not present

## 2024-03-11 DIAGNOSIS — E1065 Type 1 diabetes mellitus with hyperglycemia: Secondary | ICD-10-CM | POA: Diagnosis not present

## 2024-03-13 DIAGNOSIS — C678 Malignant neoplasm of overlapping sites of bladder: Secondary | ICD-10-CM | POA: Diagnosis not present

## 2024-03-19 ENCOUNTER — Encounter (HOSPITAL_BASED_OUTPATIENT_CLINIC_OR_DEPARTMENT_OTHER): Payer: Self-pay | Admitting: Emergency Medicine

## 2024-03-19 ENCOUNTER — Other Ambulatory Visit: Payer: Self-pay

## 2024-03-19 ENCOUNTER — Emergency Department (HOSPITAL_BASED_OUTPATIENT_CLINIC_OR_DEPARTMENT_OTHER)
Admission: EM | Admit: 2024-03-19 | Discharge: 2024-03-19 | Disposition: A | Discharge status: Home / Self Care | Attending: Emergency Medicine | Admitting: Emergency Medicine

## 2024-03-19 DIAGNOSIS — Z794 Long term (current) use of insulin: Secondary | ICD-10-CM | POA: Diagnosis not present

## 2024-03-19 DIAGNOSIS — N3000 Acute cystitis without hematuria: Secondary | ICD-10-CM | POA: Diagnosis not present

## 2024-03-19 DIAGNOSIS — Z7901 Long term (current) use of anticoagulants: Secondary | ICD-10-CM | POA: Diagnosis not present

## 2024-03-19 DIAGNOSIS — E11649 Type 2 diabetes mellitus with hypoglycemia without coma: Secondary | ICD-10-CM | POA: Diagnosis not present

## 2024-03-19 DIAGNOSIS — E162 Hypoglycemia, unspecified: Secondary | ICD-10-CM | POA: Diagnosis not present

## 2024-03-19 LAB — URINALYSIS, ROUTINE W REFLEX MICROSCOPIC
Bilirubin Urine: NEGATIVE
Glucose, UA: 100 mg/dL — AB
Ketones, ur: NEGATIVE mg/dL
Nitrite: NEGATIVE
Protein, ur: 30 mg/dL — AB
Specific Gravity, Urine: 1.015 (ref 1.005–1.030)
pH: 7 (ref 5.0–8.0)

## 2024-03-19 LAB — URINALYSIS, MICROSCOPIC (REFLEX): WBC, UA: >50 WBC/hpf (ref 0–5)

## 2024-03-19 LAB — CBG MONITORING, ED
Glucose-Capillary: 167 mg/dL — ABNORMAL HIGH (ref 70–99)
Glucose-Capillary: 234 mg/dL — ABNORMAL HIGH (ref 70–99)

## 2024-03-19 MED ORDER — FOSFOMYCIN TROMETHAMINE 3 G PO PACK
3.0000 g | PACK | Freq: Once | ORAL | Status: AC
  Administered 2024-03-19: 3 g via ORAL
  Filled 2024-03-19: qty 3

## 2024-03-19 NOTE — ED Notes (Signed)
 Blood sugar per [facility] Glucose machine- 234

## 2024-03-19 NOTE — ED Triage Notes (Signed)
 Pt is T1D. Pt wears omnipod. She states " I am having trouble with my diabetes." Per daughter her blood sugar dropped to 61, she ate pb crackers, OJ and coke, and it came up to 99. 10 minutes after the 99 it dropped to 66 again. Daughter called omnipod phone number and switched the device to manual and stopped her insulin . Pt having continued problems since keeping her blood sugars above 70. Called "the doctor" and removed the sensor. CBGs since have been between 60s-120s. Denies any other sx of illness.

## 2024-03-19 NOTE — ED Notes (Signed)
 Collect blood for labs at this time. Patient is a difficult stick. EDP is aware.

## 2024-03-19 NOTE — ED Notes (Addendum)
 Pt presents with low blood sugars. She has been exp. episodes Occas over last months. Was on Insulin  pump. No longer on now. Checked Blood sugar with [facility] CBG and values are comparable to each other. Last value was 167

## 2024-03-19 NOTE — ED Provider Notes (Signed)
 [place] [facility] Provider Note   CSN: [id number] Arrival date & time: 03/19/24  0128     History  Chief Complaint  Patient presents with   Blood Sugar Problem    [name] is a 89 y.o. female.  Presents to the emergency department for evaluation of low blood sugar.  Patient has a continuous glucose sensor and also 1 month ago was placed on an insulin  pump.  Today there have been multiple episodes of low blood sugars.       Home Medications Prior to Admission medications   Medication Sig Start Date End Date Taking? Authorizing Provider  amiodarone  (PACERONE ) 100 MG tablet Take 1 tablet (100 mg total) by mouth daily. 02/10/24   Hilty, Aviva Lemmings, MD  Blood Glucose Monitoring Suppl DEVI 1 each by Does not apply route 3 (three) times daily. May dispense any manufacturer covered by patient's insurance. 03/04/24   Rai, Hurman Maiden, MD  cholecalciferol  (VITAMIN D3) 25 MCG (1000 UNIT) tablet Take 1,000 Units by mouth daily.    [provider]  Continuous Glucose Sensor (FREESTYLE LIBRE 3 SENSOR) MISC 8 Units by Does not apply route 3 (three) times daily. Place 1 sensor on the skin every 14 days. Use to check glucose continuously 03/04/24   Rai, Ripudeep K, MD  diltiazem  (TIADYLT  ER) 180 MG 24 hr capsule TAKE 1 CAPSULE BY MOUTH EVERY DAY Patient taking differently: Take 180 mg by mouth in the morning. 12/15/23   Hilty, Aviva Lemmings, MD  docusate sodium  (COLACE) 100 MG capsule Take 1 capsule (100 mg total) by mouth every 12 (twelve) hours. Patient not taking: Reported on 03/02/2024 11/27/23   Harlow Lighter, [place]  N, FNP  ELIQUIS  2.5 MG TABS tablet TAKE 1 TABLET BY MOUTH TWICE A DAY 11/09/23   Hilty, Aviva Lemmings, MD  furosemide  (LASIX ) 20 MG tablet TAKE 1 TABLET (20 MG TOTAL) BY MOUTH DAILY AS NEEDED FOR FLUID OR EDEMA. 10/13/23   Morey Ar, NP  Glucose Blood (BLOOD GLUCOSE TEST STRIPS) STRP 1 each by Does not apply route 3 (three) times daily.  Use as directed to check blood sugar. May dispense any manufacturer covered by patient's insurance and fits patient's device. Patient not taking: Reported on 03/06/2024 03/04/24   Loma Rising, MD  insulin  aspart (NOVOLOG ) 100 UNIT/ML FlexPen Inject 8 Units into the skin 3 (three) times daily with meals. Only take if eating a meal AND Blood Glucose (BG) is 80 or higher. 03/04/24   Rai, Hurman Maiden, MD  insulin  degludec (TRESIBA ) 100 UNIT/ML FlexTouch Pen Inject 16 Units into the skin daily. May substitute as needed per insurance. 03/05/24   Rai, Hurman Maiden, MD  Insulin  Pen Needle (PEN NEEDLES) 31G X 5 MM MISC 1 each by Does not apply route 3 (three) times daily. May dispense any manufacturer covered by patient's insurance. 03/04/24   Rai, Hurman Maiden, MD  Lancet Device MISC 1 each by Does not apply route 3 (three) times daily. May dispense any manufacturer covered by patient's insurance. Patient not taking: Reported on 03/06/2024 03/04/24   Loma Rising, MD  Lancets MISC 1 each by Does not apply route 3 (three) times daily. Use as directed to check blood sugar. May dispense any manufacturer covered by patient's insurance and fits patient's device. Patient not taking: Reported on 03/06/2024 03/04/24   Rai, Hurman Maiden, MD  levothyroxine  (SYNTHROID ) 112 MCG tablet Take 112 mcg by mouth daily before breakfast.    [provider]  mirtazapine  (REMERON ) 30 MG tablet Take 30 mg by mouth at bedtime as needed (sleep). Patient not taking: Reported on 03/06/2024    [provider]  ondansetron  (ZOFRAN ) 4 MG tablet Take 1 tablet (4 mg total) by mouth every 8 (eight) hours as needed for nausea or vomiting. Patient not taking: Reported on 03/06/2024 03/04/24   Rai, Hurman Maiden, MD  pantoprazole  (PROTONIX ) 40 MG tablet Take 40 mg by mouth daily. Patient not taking: Reported on 03/06/2024 12/30/23   [provider]      Allergies    Patient has no known allergies.    Review of Systems   Review  of Systems  Physical Exam Updated Vital Signs BP (!) 142/52   Pulse (!) 56   Temp 98.5 F (36.9 C) (Oral)   Resp 20   Ht 5\' 2"  (1.575 m)   Wt 54.8 kg   SpO2 95%   BMI 22.09 kg/m  Physical Exam Vitals and nursing note reviewed.  Constitutional:      General: [name] is not in acute distress.    Appearance: [name] is well-developed.  HENT:     Head: Normocephalic and atraumatic.     Mouth/Throat:     Mouth: Mucous membranes are moist.  Eyes:     General: Vision grossly intact. Gaze aligned appropriately.     Extraocular Movements: Extraocular movements intact.     Conjunctiva/sclera: Conjunctivae normal.  Cardiovascular:     Rate and Rhythm: Normal rate and regular rhythm.     Pulses: Normal pulses.     Heart sounds: Normal heart sounds, S1 normal and S2 normal. No murmur heard.    No friction rub. No gallop.  Pulmonary:     Effort: Pulmonary effort is normal. No respiratory distress.     Breath sounds: Normal breath sounds.  Abdominal:     General: Bowel sounds are normal.     Palpations: Abdomen is soft.     Tenderness: There is no abdominal tenderness. There is no guarding or rebound.     Hernia: No hernia is present.  Musculoskeletal:        General: No swelling.     Cervical back: Full passive range of motion without pain, normal range of motion and neck supple. No spinous process tenderness or muscular tenderness. Normal range of motion.     Right lower leg: No edema.     Left lower leg: No edema.  Skin:    General: Skin is warm and dry.     Capillary Refill: Capillary refill takes less than 2 seconds.     Findings: No ecchymosis, erythema, rash or wound.  Neurological:     General: No focal deficit present.     Mental Status: [name] is alert and oriented to person, place, and time.     GCS: GCS eye subscore is 4. GCS verbal subscore is 5. GCS motor subscore is 6.     Cranial Nerves: Cranial nerves 2-12 are intact.     Sensory: Sensation is intact.     Motor: Motor  function is intact.     Coordination: Coordination is intact.  Psychiatric:        Attention and Perception: Attention normal.        Mood and Affect: Mood normal.        Speech: Speech normal.        Behavior: Behavior normal.     ED Results / Procedures / Treatments   Labs (all labs  ordered are listed, but only abnormal results are displayed) Labs Reviewed  URINALYSIS, ROUTINE W REFLEX MICROSCOPIC - Abnormal; Notable for the following components:      Result Value   APPearance HAZY (*)    Glucose, UA 100 (*)    Hgb urine dipstick SMALL (*)    Protein, ur 30 (*)    Leukocytes,Ua LARGE (*)    All other components within normal limits  URINALYSIS, MICROSCOPIC (REFLEX) - Abnormal; Notable for the following components:   Bacteria, UA FEW (*)    All other components within normal limits  CBG MONITORING, ED - Abnormal; Notable for the following components:   Glucose-Capillary 167 (*)    All other components within normal limits  CBG MONITORING, ED - Abnormal; Notable for the following components:   Glucose-Capillary 234 (*)    All other components within normal limits  URINE CULTURE  CBC WITH DIFFERENTIAL/PLATELET  BASIC METABOLIC PANEL WITH GFR    EKG None  Radiology No results found.  Procedures Procedures    Medications Ordered in ED Medications  fosfomycin (MONUROL) packet 3 g (has no administration in time range)    ED Course/ Medical Decision Making/ A&P                                 Medical Decision Making Amount and/or Complexity of Data Reviewed Labs: ordered.  Risk Prescription drug management.   Patient presents for evaluation of low blood sugars.  Patient was recently switched to insulin  pump and has been doing well.  [name] has had some low sugars today.  No other obvious changes.  Patient currently has the pump turned off.  Blood sugar has remained normal to high here in the ED.  Latest repeat was 234.  [name] has not had any recurrent  hypoglycemic episodes.  Urinalysis suggestive of infection.  [name] does have a history of recurrent UTI.  No obvious signs or symptoms of sepsis.  Will treat with fosfomycin.  Patient to contact her endocrinologist later this morning for further instructions on her insulin  pump.        Final Clinical Impression(s) / ED Diagnoses Final diagnoses:  Hypoglycemia  Acute cystitis without hematuria    Rx / DC Orders ED Discharge Orders     None         Ballard Bongo, MD 03/19/24 [phone]

## 2024-03-20 LAB — URINE CULTURE: Culture: <10000 — AB

## 2024-03-29 DIAGNOSIS — R8271 Bacteriuria: Secondary | ICD-10-CM | POA: Diagnosis not present

## 2024-03-29 DIAGNOSIS — C678 Malignant neoplasm of overlapping sites of bladder: Secondary | ICD-10-CM | POA: Diagnosis not present

## 2024-04-04 DIAGNOSIS — C678 Malignant neoplasm of overlapping sites of bladder: Secondary | ICD-10-CM | POA: Diagnosis not present

## 2024-04-11 DIAGNOSIS — C689 Malignant neoplasm of urinary organ, unspecified: Secondary | ICD-10-CM | POA: Diagnosis not present

## 2024-04-11 DIAGNOSIS — I480 Paroxysmal atrial fibrillation: Secondary | ICD-10-CM | POA: Diagnosis not present

## 2024-04-11 DIAGNOSIS — E782 Mixed hyperlipidemia: Secondary | ICD-10-CM | POA: Diagnosis not present

## 2024-04-11 DIAGNOSIS — E039 Hypothyroidism, unspecified: Secondary | ICD-10-CM | POA: Diagnosis not present

## 2024-04-11 DIAGNOSIS — E139 Other specified diabetes mellitus without complications: Secondary | ICD-10-CM | POA: Diagnosis not present

## 2024-04-17 DIAGNOSIS — C678 Malignant neoplasm of overlapping sites of bladder: Secondary | ICD-10-CM | POA: Diagnosis not present

## 2024-04-17 DIAGNOSIS — R8271 Bacteriuria: Secondary | ICD-10-CM | POA: Diagnosis not present

## 2024-04-20 DIAGNOSIS — Z978 Presence of other specified devices: Secondary | ICD-10-CM | POA: Diagnosis not present

## 2024-04-20 DIAGNOSIS — Z9641 Presence of insulin pump (external) (internal): Secondary | ICD-10-CM | POA: Diagnosis not present

## 2024-04-20 DIAGNOSIS — E139 Other specified diabetes mellitus without complications: Secondary | ICD-10-CM | POA: Diagnosis not present

## 2024-04-20 DIAGNOSIS — Z794 Long term (current) use of insulin: Secondary | ICD-10-CM | POA: Diagnosis not present

## 2024-04-20 DIAGNOSIS — Z79899 Other long term (current) drug therapy: Secondary | ICD-10-CM | POA: Diagnosis not present

## 2024-04-20 DIAGNOSIS — E890 Postprocedural hypothyroidism: Secondary | ICD-10-CM | POA: Diagnosis not present

## 2024-04-20 DIAGNOSIS — Z79890 Hormone replacement therapy (postmenopausal): Secondary | ICD-10-CM | POA: Diagnosis not present

## 2024-05-06 DIAGNOSIS — X501XXA Overexertion from prolonged static or awkward postures, initial encounter: Secondary | ICD-10-CM | POA: Diagnosis not present

## 2024-05-06 DIAGNOSIS — S92351A Displaced fracture of fifth metatarsal bone, right foot, initial encounter for closed fracture: Secondary | ICD-10-CM | POA: Diagnosis not present

## 2024-05-06 DIAGNOSIS — S92301A Fracture of unspecified metatarsal bone(s), right foot, initial encounter for closed fracture: Secondary | ICD-10-CM | POA: Diagnosis not present

## 2024-05-06 DIAGNOSIS — M25571 Pain in right ankle and joints of right foot: Secondary | ICD-10-CM | POA: Diagnosis not present

## 2024-05-11 DIAGNOSIS — M79671 Pain in right foot: Secondary | ICD-10-CM | POA: Diagnosis not present

## 2024-05-17 DIAGNOSIS — C678 Malignant neoplasm of overlapping sites of bladder: Secondary | ICD-10-CM | POA: Diagnosis not present

## 2024-05-17 DIAGNOSIS — R350 Frequency of micturition: Secondary | ICD-10-CM | POA: Diagnosis not present

## 2024-05-17 DIAGNOSIS — R8271 Bacteriuria: Secondary | ICD-10-CM | POA: Diagnosis not present

## 2024-05-17 DIAGNOSIS — N3941 Urge incontinence: Secondary | ICD-10-CM | POA: Diagnosis not present

## 2024-05-20 ENCOUNTER — Other Ambulatory Visit: Payer: Self-pay

## 2024-05-20 ENCOUNTER — Encounter (HOSPITAL_BASED_OUTPATIENT_CLINIC_OR_DEPARTMENT_OTHER): Payer: Self-pay | Admitting: Emergency Medicine

## 2024-05-20 ENCOUNTER — Emergency Department (HOSPITAL_BASED_OUTPATIENT_CLINIC_OR_DEPARTMENT_OTHER)

## 2024-05-20 ENCOUNTER — Emergency Department (HOSPITAL_BASED_OUTPATIENT_CLINIC_OR_DEPARTMENT_OTHER)
Admission: EM | Admit: 2024-05-20 | Discharge: 2024-05-21 | Disposition: A | Discharge status: Home / Self Care | Attending: Emergency Medicine | Admitting: Emergency Medicine

## 2024-05-20 DIAGNOSIS — D649 Anemia, unspecified: Secondary | ICD-10-CM | POA: Diagnosis not present

## 2024-05-20 DIAGNOSIS — R739 Hyperglycemia, unspecified: Secondary | ICD-10-CM

## 2024-05-20 DIAGNOSIS — B9689 Other specified bacterial agents as the cause of diseases classified elsewhere: Secondary | ICD-10-CM | POA: Diagnosis not present

## 2024-05-20 DIAGNOSIS — J439 Emphysema, unspecified: Secondary | ICD-10-CM | POA: Diagnosis not present

## 2024-05-20 DIAGNOSIS — Z794 Long term (current) use of insulin: Secondary | ICD-10-CM | POA: Diagnosis not present

## 2024-05-20 DIAGNOSIS — I10 Essential (primary) hypertension: Secondary | ICD-10-CM | POA: Diagnosis not present

## 2024-05-20 DIAGNOSIS — E1065 Type 1 diabetes mellitus with hyperglycemia: Secondary | ICD-10-CM | POA: Diagnosis not present

## 2024-05-20 DIAGNOSIS — N289 Disorder of kidney and ureter, unspecified: Secondary | ICD-10-CM | POA: Diagnosis not present

## 2024-05-20 DIAGNOSIS — Z87891 Personal history of nicotine dependence: Secondary | ICD-10-CM | POA: Diagnosis not present

## 2024-05-20 DIAGNOSIS — I700 Atherosclerosis of aorta: Secondary | ICD-10-CM | POA: Diagnosis not present

## 2024-05-20 DIAGNOSIS — Z79899 Other long term (current) drug therapy: Secondary | ICD-10-CM | POA: Insufficient documentation

## 2024-05-20 DIAGNOSIS — N390 Urinary tract infection, site not specified: Secondary | ICD-10-CM | POA: Insufficient documentation

## 2024-05-20 DIAGNOSIS — Z7901 Long term (current) use of anticoagulants: Secondary | ICD-10-CM | POA: Diagnosis not present

## 2024-05-20 DIAGNOSIS — E1165 Type 2 diabetes mellitus with hyperglycemia: Secondary | ICD-10-CM | POA: Insufficient documentation

## 2024-05-20 DIAGNOSIS — R3589 Other polyuria: Secondary | ICD-10-CM | POA: Diagnosis present

## 2024-05-20 LAB — BASIC METABOLIC PANEL WITH GFR
Anion gap: 12 (ref 5–15)
BUN: 32 mg/dL — ABNORMAL HIGH (ref 8–23)
CO2: 22 mmol/L (ref 22–32)
Calcium: 8.9 mg/dL (ref 8.9–10.3)
Chloride: 101 mmol/L (ref 98–111)
Creatinine, Ser: 2.35 mg/dL — ABNORMAL HIGH (ref 0.44–1.00)
GFR, Estimated: 20 mL/min — ABNORMAL LOW (ref >60)
Glucose, Bld: 360 mg/dL — ABNORMAL HIGH (ref 70–99)
Potassium: 4 mmol/L (ref 3.5–5.1)
Sodium: 135 mmol/L (ref 135–145)

## 2024-05-20 LAB — CBC WITH DIFFERENTIAL/PLATELET
Abs Immature Granulocytes: 0.02 K/uL (ref 0.00–0.07)
Basophils Absolute: 0.1 K/uL (ref 0.0–0.1)
Basophils Relative: 2 %
Eosinophils Absolute: 0.2 K/uL (ref 0.0–0.5)
Eosinophils Relative: 3 %
HCT: 25 % — ABNORMAL LOW (ref 36.0–46.0)
Hemoglobin: 8.1 g/dL — ABNORMAL LOW (ref 12.0–15.0)
Immature Granulocytes: 0 %
Lymphocytes Relative: 15 %
Lymphs Abs: 0.9 K/uL (ref 0.7–4.0)
MCH: 27.4 pg (ref 26.0–34.0)
MCHC: 32.4 g/dL (ref 30.0–36.0)
MCV: 84.5 fL (ref 80.0–100.0)
Monocytes Absolute: 0.8 K/uL (ref 0.1–1.0)
Monocytes Relative: 14 %
Neutro Abs: 4 K/uL (ref 1.7–7.7)
Neutrophils Relative %: 66 %
Platelets: 259 K/uL (ref 150–400)
RBC: 2.96 MIL/uL — ABNORMAL LOW (ref 3.87–5.11)
RDW: 17.5 % — ABNORMAL HIGH (ref 11.5–15.5)
WBC: 6 K/uL (ref 4.0–10.5)
nRBC: 0 % (ref 0.0–0.2)

## 2024-05-20 LAB — CBG MONITORING, ED: Glucose-Capillary: 371 mg/dL — ABNORMAL HIGH (ref 70–99)

## 2024-05-20 NOTE — ED Provider Notes (Signed)
 [place] [facility] Provider Note   CSN: [id number] Arrival date & time: 05/20/24  2246     Patient presents with: Hyperglycemia and Weakness   [name] is a 89 y.o. female.  {Add pertinent medical, surgical, social history, OB history to YEP:67052} The history is provided by the patient.   She has history of hypertension, diabetes, hyperlipidemia, peripheral vascular disease and comes in because of elevated blood sugars at home for the last 3 days.  Blood sugars have been as high as 400.  She has noted polyuria but denies dysuria.  She denies fever or chills.  She denies any cough or dyspnea.  She also states she is feeling generally weak.  Of note, she is on an insulin  pump.    Prior to Admission medications   Medication Sig Start Date End Date Taking? Authorizing Provider  amiodarone  (PACERONE ) 100 MG tablet Take 1 tablet (100 mg total) by mouth daily. 02/10/24   Hilty, Vinie BROCKS, MD  Blood Glucose Monitoring Suppl DEVI 1 each by Does not apply route 3 (three) times daily. May dispense any manufacturer covered by patient's insurance. 03/04/24   Rai, Ripudeep MARLA, MD  cholecalciferol  (VITAMIN D3) 25 MCG (1000 UNIT) tablet Take 1,000 Units by mouth daily.    [provider]  Continuous Glucose Sensor (FREESTYLE LIBRE 3 SENSOR) MISC 8 Units by Does not apply route 3 (three) times daily. Place 1 sensor on the skin every 14 days. Use to check glucose continuously 03/04/24   Rai, Ripudeep K, MD  diltiazem  (TIADYLT  ER) 180 MG 24 hr capsule TAKE 1 CAPSULE BY MOUTH EVERY DAY Patient taking differently: Take 180 mg by mouth in the morning. 12/15/23   Hilty, Vinie BROCKS, MD  docusate sodium  (COLACE) 100 MG capsule Take 1 capsule (100 mg total) by mouth every 12 (twelve) hours. Patient not taking: Reported on 03/02/2024 11/27/23   Dreama, [place]  N, FNP  ELIQUIS  2.5 MG TABS tablet TAKE 1 TABLET BY MOUTH TWICE A DAY 11/09/23   Hilty, Vinie BROCKS, MD   furosemide  (LASIX ) 20 MG tablet TAKE 1 TABLET (20 MG TOTAL) BY MOUTH DAILY AS NEEDED FOR FLUID OR EDEMA. 10/13/23   Loistine Sober, NP  Glucose Blood (BLOOD GLUCOSE TEST STRIPS) STRP 1 each by Does not apply route 3 (three) times daily. Use as directed to check blood sugar. May dispense any manufacturer covered by patient's insurance and fits patient's device. Patient not taking: Reported on 03/06/2024 03/04/24   Davia Nydia MARLA, MD  insulin  aspart (NOVOLOG ) 100 UNIT/ML FlexPen Inject 8 Units into the skin 3 (three) times daily with meals. Only take if eating a meal AND Blood Glucose (BG) is 80 or higher. 03/04/24   Rai, Nydia MARLA, MD  insulin  degludec (TRESIBA ) 100 UNIT/ML FlexTouch Pen Inject 16 Units into the skin daily. May substitute as needed per insurance. 03/05/24   Rai, Nydia MARLA, MD  Insulin  Pen Needle (PEN NEEDLES) 31G X 5 MM MISC 1 each by Does not apply route 3 (three) times daily. May dispense any manufacturer covered by patient's insurance. 03/04/24   Rai, Nydia MARLA, MD  Lancet Device MISC 1 each by Does not apply route 3 (three) times daily. May dispense any manufacturer covered by patient's insurance. Patient not taking: Reported on 03/06/2024 03/04/24   Davia Nydia MARLA, MD  Lancets MISC 1 each by Does not apply route 3 (three) times daily. Use as directed to check blood sugar. May dispense any manufacturer  covered by patient's insurance and fits patient's device. Patient not taking: Reported on 03/06/2024 03/04/24   Rai, Nydia POUR, MD  levothyroxine  (SYNTHROID ) 112 MCG tablet Take 112 mcg by mouth daily before breakfast.    [provider]  mirtazapine  (REMERON ) 30 MG tablet Take 30 mg by mouth at bedtime as needed (sleep). Patient not taking: Reported on 03/06/2024    [provider]  ondansetron  (ZOFRAN ) 4 MG tablet Take 1 tablet (4 mg total) by mouth every 8 (eight) hours as needed for nausea or vomiting. Patient not taking: Reported on 03/06/2024 03/04/24   Rai,  Nydia POUR, MD  pantoprazole  (PROTONIX ) 40 MG tablet Take 40 mg by mouth daily. Patient not taking: Reported on 03/06/2024 12/30/23   [provider]    Allergies: Patient has no known allergies.    Review of Systems  All other systems reviewed and are negative.   Updated Vital Signs BP (!) 155/67   Pulse 77   Ht 5' 2 (1.575 m)   Wt 58.1 kg   SpO2 100%   BMI 23.41 kg/m   Physical Exam Vitals and nursing note reviewed.   89 year old female, resting comfortably and in no acute distress. Vital signs are significant for elevated blood pressure. Oxygen saturation is 100%, which is normal. Head is normocephalic and atraumatic. PERRLA, EOMI.  Neck is nontender and supple without adenopathy. Lungs are clear without rales, wheezes, or rhonchi. Chest is nontender. Heart has regular rate and rhythm without murmur. Abdomen is soft, flat, nontender. Extremities have no cyanosis or edema, full range of motion is present. Skin is warm and dry without rash. Neurologic: Mental status is normal, cranial nerves are intact, moves all extremities equally.  (all labs ordered are listed, but only abnormal results are displayed) Labs Reviewed - No data to display  EKG: None  Radiology: No results found.  {Document cardiac monitor, telemetry assessment procedure when appropriate:32947} Procedures   Medications Ordered in the ED - No data to display    {Click here for ABCD2, HEART and other calculators REFRESH Note before signing:1}                              Medical Decision Making  Elevated blood glucose in patient with diabetes and on an insulin  pump.  Consider occult infection.  I have reviewed her past records, and note ED visit on 03/19/2024 for hypoglycemia, hospital admission on 03/02/2024 for ketoacidosis.  CBG on arrival was moderately elevated at 371.  I have ordered laboratory testing of CBC, basic metabolic panel, urinalysis send I have ordered a chest x-ray to rule out  occult pneumonia.  {Document critical care time when appropriate  Document review of labs and clinical decision tools ie CHADS2VASC2, etc  Document your independent review of radiology images and any outside records  Document your discussion with family members, caretakers and with consultants  Document social determinants of health affecting pt's care  Document your decision making why or why not admission, treatments were needed:32947:::1}   Final diagnoses:  None    ED Discharge Orders     None

## 2024-05-20 NOTE — ED Triage Notes (Signed)
 Pt reports hx of DM, on CGM & pump, checked sugar at home and it was 400, pt bolused herself Novolog  4 units  Pt also c/o being feeling weak and off-balance or oozy today after church, LKW 1200  NIH scale negative, A&Ox4, denies any changes in speech, vision, or sensation

## 2024-05-20 NOTE — ED Notes (Signed)
 Pt is High Fall Risk. Yellow armband placed on patient. Nonskid socks/patient shoes on. Fall Risk magnet placed on pt room door. Pt given call light and instructed to call for assistance before getting up. Pt/visitor verbalized understanding. Pt is High Fall Risk. Yellow armband placed on patient. Nonskid socks/patient shoes on. Fall Risk magnet placed on pt room door. Pt given call light and instructed to call for assistance before getting up. Pt/visitor verbalized understanding.

## 2024-05-20 NOTE — ED Notes (Signed)
Pt made aware urine needed  

## 2024-05-21 LAB — URINALYSIS, ROUTINE W REFLEX MICROSCOPIC
Bilirubin Urine: NEGATIVE
Glucose, UA: >=500 mg/dL — AB
Ketones, ur: NEGATIVE mg/dL
Nitrite: NEGATIVE
Protein, ur: 30 mg/dL — AB
Specific Gravity, Urine: 1.015 (ref 1.005–1.030)
pH: 7 (ref 5.0–8.0)

## 2024-05-21 LAB — URINALYSIS, MICROSCOPIC (REFLEX): WBC, UA: >50 WBC/hpf (ref 0–5)

## 2024-05-21 MED ORDER — SODIUM CHLORIDE 0.9 % IV SOLN
2.0000 g | Freq: Once | INTRAVENOUS | Status: AC
  Administered 2024-05-21 (×2): 2 g via INTRAVENOUS
  Filled 2024-05-21: qty 20

## 2024-05-21 MED ORDER — CEFDINIR 300 MG PO CAPS: 300.0000 mg | ORAL_CAPSULE | Freq: Every day | ORAL | 0 refills | Status: AC

## 2024-05-21 MED ORDER — INSULIN ASPART 100 UNIT/ML IV SOLN: 10.0000 [IU] | Freq: Once | INTRAVENOUS | Status: DC

## 2024-05-21 NOTE — ED Notes (Signed)
 Pt laid flat at 12:43 for orthostatics vitals

## 2024-05-21 NOTE — Discharge Instructions (Addendum)
 Continue to monitor your blood sugar at home.  As the infection comes under control, your blood sugars should get back to the levels that it had been at previously.  Please make sure to follow-up with your primary care provider after you have completed the course of antibiotics.  It is important to check the urine to make sure that the infection has cleared.  Your urine was sent for a culture.  Culture should be back in 2 to-3 days.  If the culture shows that you need to be on a different antibiotic, we will contact you.  Return to the emergency department if you start running a fever or start vomiting.

## 2024-05-23 LAB — URINE CULTURE: Culture: >=100000 — AB

## 2024-05-24 ENCOUNTER — Telehealth (HOSPITAL_BASED_OUTPATIENT_CLINIC_OR_DEPARTMENT_OTHER): Payer: Self-pay

## 2024-05-24 NOTE — Telephone Encounter (Signed)
 Post ED Visit - Positive Culture Follow-up: Unsuccessful Patient Follow-up  Culture assessed and recommendations reviewed by:  []  Rankin Dee, Pharm.D. [x]  Venetia Gully, Pharm.D., BCPS AQ-ID []  Garrel Crews, Pharm.D., BCPS []  Almarie Lunger, Pharm.D., BCPS []  [place], [address].D., BCPS, AAHIVP []  Rosaline Bihari, Pharm.D., BCPS, AAHIVP []  Massie Rigg, PharmD []  Jodie Rower, PharmD, BCPS  Positive urine culture  []  Patient discharged without antimicrobial prescription and treatment is now indicated [x]  Organism is resistant to prescribed ED discharge antimicrobial []  Patient with positive blood cultures  Plan: - Stop cefdinir . call patient to assess clinical status, if patient reports dysuria, start ciprofloxacin PO 500 mg q24h for 3 days  Reviewed by ED Provider: Lamar Gander, MD    Unable to contact patient after 3 attempts, letter will be sent to address on file  [name] 05/24/2024, 10:06 AM

## 2024-05-24 NOTE — Progress Notes (Signed)
 ED Antimicrobial Stewardship Positive Culture Follow Up   [name] is an 89 y.o. female who presented to [facility] on [facility]24 with a chief complaint of  Chief Complaint  Patient presents with   Hyperglycemia   Weakness    Recent Results (from the past 720 hours)  Urine Culture     Status: Abnormal   Collection Time: 05/21/24 12:39 AM   Specimen: Urine, Clean Catch  Result Value Ref Range Status   Specimen Description   Final    URINE, CLEAN CATCH Performed at [facility], [address] [place], [place] [address]    Special Requests   Final    NONE Performed at [facility], [address] [place], [place] [address]    Culture >=100,000 COLONIES/mL [facility] MORGANII (A)  Final   Report Status 05/23/2024 FINAL  Final   Organism ID, Bacteria MORGANELLA MORGANII (A)  Final      Susceptibility   Morganella morganii - MIC*    AMPICILLIN >=32 RESISTANT Resistant     ERTAPENEM <=0.12 SENSITIVE Sensitive     CIPROFLOXACIN <=0.06 SENSITIVE Sensitive     GENTAMICIN <=1 SENSITIVE Sensitive     NITROFURANTOIN 128 RESISTANT Resistant     TRIMETH/SULFA <=20 SENSITIVE Sensitive     AMPICILLIN/SULBACTAM 16 INTERMEDIATE Intermediate     PIP/TAZO Value in next row Sensitive ug/mL     <=4 SENSITIVEThis is a modified FDA-approved test that has been validated and its performance characteristics determined by the reporting laboratory.  This laboratory is certified under the Clinical Laboratory Improvement Amendments CLIA as qualified to perform high complexity clinical laboratory testing.    MEROPENEM  Value in next row Sensitive      <=4 SENSITIVEThis is a modified FDA-approved test that has been validated and its performance characteristics determined by the reporting laboratory.  This laboratory is certified under the Clinical Laboratory Improvement Amendments CLIA as qualified to perform high complexity clinical laboratory testing.    * >=100,000  COLONIES/mL MORGANELLA MORGANII    [x]  Treated with cefdinir , organism resistant to prescribed antimicrobial  Plan:  - Stop cefdinir  given that it is not active against Morganella morganii  - RN to call patient to assess clinical status, if patient reports dysuria, can initiate ciprofloxacin PO 500 mg q24h for 3 days   ED Provider: Lamar Gander, MD   Feliciano Close, PharmD PGY2 Infectious Diseases Pharmacy Resident  05/24/2024 9:28 AM    Monday - Friday phone -  [phone] Saturday - Sunday phone - [phone]

## 2024-05-30 ENCOUNTER — Telehealth (HOSPITAL_BASED_OUTPATIENT_CLINIC_OR_DEPARTMENT_OTHER): Payer: Self-pay

## 2024-05-30 NOTE — Telephone Encounter (Signed)
 Post ED Visit - Positive Culture Follow-up: Successful Patient Follow-Up  Culture assessed and recommendations reviewed by:  []  Rankin Dee, Pharm.D. []  Venetia Gully, Pharm.D., BCPS AQ-ID []  Garrel Crews, Pharm.D., BCPS []  Almarie Lunger, [address].D., BCPS []  [place], [place].D., BCPS, AAHIVP []  Rosaline Bihari, Pharm.D., BCPS, AAHIVP []  Vernell Meier, PharmD, BCPS []  Latanya Hint, PharmD, BCPS []  Donald Medley, PharmD, BCPS []  Rocky Bold, PharmD  Positive urine culture  []  Patient discharged without antimicrobial prescription and treatment is now indicated [x]  Organism is resistant to prescribed ED discharge antimicrobial []  Patient with positive blood cultures  Plan - Stop Cefdinir  and call pt for s/s check if no symptoms no abx needed. If having symptoms start Cipro.   Pt called back and stated that she was not having any symptoms. Instructed pt to stop taking Cefdinir . No abx needed. Pt states she has an appointment soon with her urologist.   Changes discussed with ED provider: Lamar Gander, MD New antibiotic prescription Ciprofloxacin 500 mg po daily for 3 days    Contacted patient, date 05/30/24, time 12:40 pm   [name] 05/30/2024, 12:44 PM

## 2024-05-31 DIAGNOSIS — N184 Chronic kidney disease, stage 4 (severe): Secondary | ICD-10-CM | POA: Diagnosis not present

## 2024-05-31 DIAGNOSIS — N390 Urinary tract infection, site not specified: Secondary | ICD-10-CM | POA: Diagnosis not present

## 2024-05-31 DIAGNOSIS — I4891 Unspecified atrial fibrillation: Secondary | ICD-10-CM | POA: Diagnosis not present

## 2024-05-31 DIAGNOSIS — N186 End stage renal disease: Secondary | ICD-10-CM | POA: Diagnosis not present

## 2024-05-31 DIAGNOSIS — N189 Chronic kidney disease, unspecified: Secondary | ICD-10-CM | POA: Diagnosis not present

## 2024-05-31 DIAGNOSIS — D631 Anemia in chronic kidney disease: Secondary | ICD-10-CM | POA: Diagnosis not present

## 2024-05-31 DIAGNOSIS — E1122 Type 2 diabetes mellitus with diabetic chronic kidney disease: Secondary | ICD-10-CM | POA: Diagnosis not present

## 2024-05-31 DIAGNOSIS — I129 Hypertensive chronic kidney disease with stage 1 through stage 4 chronic kidney disease, or unspecified chronic kidney disease: Secondary | ICD-10-CM | POA: Diagnosis not present

## 2024-05-31 DIAGNOSIS — C679 Malignant neoplasm of bladder, unspecified: Secondary | ICD-10-CM | POA: Diagnosis not present

## 2024-06-01 ENCOUNTER — Other Ambulatory Visit: Payer: Self-pay | Admitting: Nephrology

## 2024-06-01 DIAGNOSIS — N184 Chronic kidney disease, stage 4 (severe): Secondary | ICD-10-CM

## 2024-06-09 ENCOUNTER — Other Ambulatory Visit: Payer: Self-pay | Admitting: Internal Medicine

## 2024-06-09 DIAGNOSIS — I480 Paroxysmal atrial fibrillation: Secondary | ICD-10-CM

## 2024-06-09 DIAGNOSIS — E1065 Type 1 diabetes mellitus with hyperglycemia: Secondary | ICD-10-CM | POA: Diagnosis not present

## 2024-06-12 NOTE — Telephone Encounter (Signed)
 Prescription refill request for Eliquis  received. Indication:afib Last office visit:9/24 Scr:2.35  8/25 Age: 89 Weight:58.1  kg  Prescription refilled

## 2024-06-22 ENCOUNTER — Ambulatory Visit
Admission: RE | Admit: 2024-06-22 | Discharge: 2024-06-22 | Disposition: A | Discharge status: Home / Self Care | Source: Ambulatory Visit | Attending: Nephrology

## 2024-06-22 ENCOUNTER — Other Ambulatory Visit

## 2024-06-22 DIAGNOSIS — N184 Chronic kidney disease, stage 4 (severe): Secondary | ICD-10-CM | POA: Diagnosis not present

## 2024-07-12 DIAGNOSIS — N185 Chronic kidney disease, stage 5: Secondary | ICD-10-CM | POA: Diagnosis not present

## 2024-07-12 DIAGNOSIS — E139 Other specified diabetes mellitus without complications: Secondary | ICD-10-CM | POA: Diagnosis not present

## 2024-07-12 DIAGNOSIS — Z23 Encounter for immunization: Secondary | ICD-10-CM | POA: Diagnosis not present

## 2024-07-12 DIAGNOSIS — E039 Hypothyroidism, unspecified: Secondary | ICD-10-CM | POA: Diagnosis not present

## 2024-07-12 DIAGNOSIS — R739 Hyperglycemia, unspecified: Secondary | ICD-10-CM | POA: Diagnosis not present

## 2024-07-12 DIAGNOSIS — R030 Elevated blood-pressure reading, without diagnosis of hypertension: Secondary | ICD-10-CM | POA: Diagnosis not present

## 2024-07-12 DIAGNOSIS — I480 Paroxysmal atrial fibrillation: Secondary | ICD-10-CM | POA: Diagnosis not present

## 2024-07-12 DIAGNOSIS — D638 Anemia in other chronic diseases classified elsewhere: Secondary | ICD-10-CM | POA: Diagnosis not present

## 2024-07-12 DIAGNOSIS — Z9181 History of falling: Secondary | ICD-10-CM | POA: Diagnosis not present

## 2024-07-14 ENCOUNTER — Other Ambulatory Visit: Payer: Self-pay | Admitting: Internal Medicine

## 2024-07-16 ENCOUNTER — Other Ambulatory Visit: Payer: Self-pay

## 2024-07-17 MED ORDER — AMIODARONE HCL 100 MG PO TABS: 100.0000 mg | ORAL_TABLET | Freq: Every day | ORAL | 0 refills | Status: DC

## 2024-08-01 DIAGNOSIS — I700 Atherosclerosis of aorta: Secondary | ICD-10-CM | POA: Diagnosis not present

## 2024-08-01 DIAGNOSIS — I509 Heart failure, unspecified: Secondary | ICD-10-CM | POA: Diagnosis not present

## 2024-08-01 DIAGNOSIS — I130 Hypertensive heart and chronic kidney disease with heart failure and stage 1 through stage 4 chronic kidney disease, or unspecified chronic kidney disease: Secondary | ICD-10-CM | POA: Diagnosis not present

## 2024-08-01 DIAGNOSIS — I4891 Unspecified atrial fibrillation: Secondary | ICD-10-CM | POA: Diagnosis not present

## 2024-08-01 DIAGNOSIS — E1122 Type 2 diabetes mellitus with diabetic chronic kidney disease: Secondary | ICD-10-CM | POA: Diagnosis not present

## 2024-08-01 DIAGNOSIS — D6869 Other thrombophilia: Secondary | ICD-10-CM | POA: Diagnosis not present

## 2024-08-01 DIAGNOSIS — E1165 Type 2 diabetes mellitus with hyperglycemia: Secondary | ICD-10-CM | POA: Diagnosis not present

## 2024-08-01 DIAGNOSIS — Z794 Long term (current) use of insulin: Secondary | ICD-10-CM | POA: Diagnosis not present

## 2024-08-01 DIAGNOSIS — N184 Chronic kidney disease, stage 4 (severe): Secondary | ICD-10-CM | POA: Diagnosis not present

## 2024-08-09 DIAGNOSIS — M79604 Pain in right leg: Secondary | ICD-10-CM | POA: Diagnosis not present

## 2024-08-09 DIAGNOSIS — I480 Paroxysmal atrial fibrillation: Secondary | ICD-10-CM | POA: Diagnosis not present

## 2024-08-09 DIAGNOSIS — N184 Chronic kidney disease, stage 4 (severe): Secondary | ICD-10-CM | POA: Diagnosis not present

## 2024-08-09 DIAGNOSIS — E039 Hypothyroidism, unspecified: Secondary | ICD-10-CM | POA: Diagnosis not present

## 2024-08-09 DIAGNOSIS — H6123 Impacted cerumen, bilateral: Secondary | ICD-10-CM | POA: Diagnosis not present

## 2024-08-09 DIAGNOSIS — E782 Mixed hyperlipidemia: Secondary | ICD-10-CM | POA: Diagnosis not present

## 2024-08-09 DIAGNOSIS — Z23 Encounter for immunization: Secondary | ICD-10-CM | POA: Diagnosis not present

## 2024-08-09 DIAGNOSIS — Z0000 Encounter for general adult medical examination without abnormal findings: Secondary | ICD-10-CM | POA: Diagnosis not present

## 2024-08-09 DIAGNOSIS — D638 Anemia in other chronic diseases classified elsewhere: Secondary | ICD-10-CM | POA: Diagnosis not present

## 2024-08-10 ENCOUNTER — Emergency Department (HOSPITAL_COMMUNITY)

## 2024-08-10 ENCOUNTER — Emergency Department (HOSPITAL_COMMUNITY)
Admission: EM | Admit: 2024-08-10 | Discharge: 2024-08-10 | Disposition: A | Discharge status: Home / Self Care | Attending: Emergency Medicine | Admitting: Emergency Medicine

## 2024-08-10 ENCOUNTER — Other Ambulatory Visit: Payer: Self-pay

## 2024-08-10 DIAGNOSIS — Z79899 Other long term (current) drug therapy: Secondary | ICD-10-CM | POA: Insufficient documentation

## 2024-08-10 DIAGNOSIS — I10 Essential (primary) hypertension: Secondary | ICD-10-CM | POA: Insufficient documentation

## 2024-08-10 DIAGNOSIS — D649 Anemia, unspecified: Secondary | ICD-10-CM | POA: Insufficient documentation

## 2024-08-10 DIAGNOSIS — Z9641 Presence of insulin pump (external) (internal): Secondary | ICD-10-CM | POA: Insufficient documentation

## 2024-08-10 DIAGNOSIS — E11649 Type 2 diabetes mellitus with hypoglycemia without coma: Secondary | ICD-10-CM | POA: Insufficient documentation

## 2024-08-10 DIAGNOSIS — R61 Generalized hyperhidrosis: Secondary | ICD-10-CM | POA: Diagnosis not present

## 2024-08-10 DIAGNOSIS — Z7901 Long term (current) use of anticoagulants: Secondary | ICD-10-CM | POA: Diagnosis not present

## 2024-08-10 DIAGNOSIS — I2510 Atherosclerotic heart disease of native coronary artery without angina pectoris: Secondary | ICD-10-CM | POA: Diagnosis not present

## 2024-08-10 DIAGNOSIS — Z794 Long term (current) use of insulin: Secondary | ICD-10-CM | POA: Insufficient documentation

## 2024-08-10 DIAGNOSIS — E039 Hypothyroidism, unspecified: Secondary | ICD-10-CM | POA: Insufficient documentation

## 2024-08-10 DIAGNOSIS — J439 Emphysema, unspecified: Secondary | ICD-10-CM | POA: Insufficient documentation

## 2024-08-10 DIAGNOSIS — R531 Weakness: Secondary | ICD-10-CM | POA: Diagnosis not present

## 2024-08-10 DIAGNOSIS — E162 Hypoglycemia, unspecified: Secondary | ICD-10-CM | POA: Diagnosis present

## 2024-08-10 DIAGNOSIS — R0989 Other specified symptoms and signs involving the circulatory and respiratory systems: Secondary | ICD-10-CM | POA: Diagnosis not present

## 2024-08-10 LAB — CBG MONITORING, ED
Glucose-Capillary: 44 mg/dL — CL (ref 70–99)
Glucose-Capillary: 69 mg/dL — ABNORMAL LOW (ref 70–99)
Glucose-Capillary: 100 mg/dL — ABNORMAL HIGH (ref 70–99)
Glucose-Capillary: 212 mg/dL — ABNORMAL HIGH (ref 70–99)

## 2024-08-10 LAB — COMPREHENSIVE METABOLIC PANEL WITH GFR
ALT: 14 U/L (ref 0–44)
AST: 22 U/L (ref 15–41)
Albumin: 3.8 g/dL (ref 3.5–5.0)
Alkaline Phosphatase: 111 U/L (ref 38–126)
Anion gap: 11 (ref 5–15)
BUN: 34 mg/dL — ABNORMAL HIGH (ref 8–23)
CO2: 23 mmol/L (ref 22–32)
Calcium: 9.6 mg/dL (ref 8.9–10.3)
Chloride: 102 mmol/L (ref 98–111)
Creatinine, Ser: 2.29 mg/dL — ABNORMAL HIGH (ref 0.44–1.00)
GFR, Estimated: 20 mL/min — ABNORMAL LOW (ref >60)
Glucose, Bld: 58 mg/dL — ABNORMAL LOW (ref 70–99)
Potassium: 5.1 mmol/L (ref 3.5–5.1)
Sodium: 137 mmol/L (ref 135–145)
Total Bilirubin: 0.3 mg/dL (ref 0.0–1.2)
Total Protein: 7.4 g/dL (ref 6.5–8.1)

## 2024-08-10 LAB — CBC WITH DIFFERENTIAL/PLATELET
Abs Immature Granulocytes: 0.04 K/uL (ref 0.00–0.07)
Basophils Absolute: 0.1 K/uL (ref 0.0–0.1)
Basophils Relative: 1 %
Eosinophils Absolute: 0.1 K/uL (ref 0.0–0.5)
Eosinophils Relative: 1 %
HCT: 28.7 % — ABNORMAL LOW (ref 36.0–46.0)
Hemoglobin: 8.8 g/dL — ABNORMAL LOW (ref 12.0–15.0)
Immature Granulocytes: 1 %
Lymphocytes Relative: 11 %
Lymphs Abs: 0.8 K/uL (ref 0.7–4.0)
MCH: 27.2 pg (ref 26.0–34.0)
MCHC: 30.7 g/dL (ref 30.0–36.0)
MCV: 88.9 fL (ref 80.0–100.0)
Monocytes Absolute: 1 K/uL (ref 0.1–1.0)
Monocytes Relative: 13 %
Neutro Abs: 5.3 K/uL (ref 1.7–7.7)
Neutrophils Relative %: 73 %
Platelets: 238 K/uL (ref 150–400)
RBC: 3.23 MIL/uL — ABNORMAL LOW (ref 3.87–5.11)
RDW: 14 % (ref 11.5–15.5)
WBC: 7.2 K/uL (ref 4.0–10.5)
nRBC: 0 % (ref 0.0–0.2)

## 2024-08-10 LAB — URINALYSIS, ROUTINE W REFLEX MICROSCOPIC
Bilirubin Urine: NEGATIVE
Glucose, UA: NEGATIVE mg/dL
Ketones, ur: NEGATIVE mg/dL
Nitrite: NEGATIVE
Protein, ur: 30 mg/dL — AB
Specific Gravity, Urine: 1.009 (ref 1.005–1.030)
WBC, UA: >50 WBC/hpf (ref 0–5)
pH: 7 (ref 5.0–8.0)

## 2024-08-10 LAB — TROPONIN T, HIGH SENSITIVITY
Troponin T High Sensitivity: 20 ng/L — ABNORMAL HIGH (ref 0–19)
Troponin T High Sensitivity: 18 ng/L (ref 0–19)

## 2024-08-10 MED ORDER — DEXTROSE 50 % IV SOLN
INTRAVENOUS | Status: AC
  Filled 2024-08-10: qty 50

## 2024-08-10 MED ORDER — DEXTROSE 50 % IV SOLN
25.0000 mL | Freq: Once | INTRAVENOUS | Status: AC
  Administered 2024-08-10: 25 mL via INTRAVENOUS
  Filled 2024-08-10: qty 50

## 2024-08-10 MED ORDER — SODIUM CHLORIDE 0.9 % IV SOLN
1.0000 g | Freq: Once | INTRAVENOUS | Status: AC
  Administered 2024-08-10: 1 g via INTRAVENOUS
  Filled 2024-08-10: qty 10

## 2024-08-10 MED ORDER — CEPHALEXIN 500 MG PO CAPS: 500.0000 mg | ORAL_CAPSULE | Freq: Two times a day (BID) | ORAL | 0 refills | Status: AC

## 2024-08-10 MED ORDER — DEXTROSE 50 % IV SOLN
50.0000 mL | Freq: Once | INTRAVENOUS | Status: AC
  Administered 2024-08-10: 50 mL via INTRAVENOUS

## 2024-08-10 NOTE — ED Provider Notes (Signed)
 [place] EMERGENCY DEPARTMENT AT [facility] Provider Note   CSN: [id number] Arrival date & time: 08/10/24  9951     Patient presents with: Hypoglycemia   [name] is a 89 y.o. female.   Patient with a history of hypertension, hypothyroidism, diabetes with insulin  pump here with hypoglycemia.  Reports she was at the ENT doctor today and did not eat her meals normally.  Around 8 or 9 PM when she began to feel lightheaded and her sugar was in the 50s.  She thought she should give herself additional insulin  and gave herself a bolus dose of short acting insulin .  She then called her daughter because her sugar was still in the 40s and 50s.  EMS was activated. She is feeling better at this time and denies any further dizziness or lightheadedness.  She feels like [place].  Denies any chest pain, shortness of breath, nausea, vomiting, cough, fever.  No pain with urination or blood in the urine.  Denies feeling dizzy or lightheaded.  Admits to not eating much today and taking too much insulin .  Her insulin  pump is now off.  The history is provided by the patient.  Hypoglycemia Associated symptoms: dizziness   Associated symptoms: no shortness of breath and no vomiting        Prior to Admission medications   Medication Sig Start Date End Date Taking? Authorizing Provider  amiodarone  (PACERONE ) 100 MG tablet Take 1 tablet (100 mg total) by mouth daily. 07/17/24   Hilty, Vinie BROCKS, MD  Blood Glucose Monitoring Suppl DEVI 1 each by Does not apply route 3 (three) times daily. May dispense any manufacturer covered by patient's insurance. 03/04/24   Rai, Nydia POUR, MD  cefdinir  (OMNICEF ) 300 MG capsule Take 1 capsule (300 mg total) by mouth daily. 05/21/24   Raford Lenis, MD  cholecalciferol  (VITAMIN D3) 25 MCG (1000 UNIT) tablet Take 1,000 Units by mouth daily.    [provider]  Continuous Glucose Sensor (FREESTYLE LIBRE 3 SENSOR) MISC 8 Units by Does not apply  route 3 (three) times daily. Place 1 sensor on the skin every 14 days. Use to check glucose continuously 03/04/24   Rai, Ripudeep K, MD  diltiazem  (TIADYLT  ER) 180 MG 24 hr capsule Take 1 capsule (180 mg total) by mouth daily. 07/16/24   Hilty, Vinie BROCKS, MD  docusate sodium  (COLACE) 100 MG capsule Take 1 capsule (100 mg total) by mouth every 12 (twelve) hours. Patient not taking: Reported on 03/02/2024 11/27/23   Dreama, [place]  N, FNP  ELIQUIS  2.5 MG TABS tablet TAKE 1 TABLET BY MOUTH TWICE A DAY 06/12/24   Hilty, Vinie BROCKS, MD  furosemide  (LASIX ) 20 MG tablet TAKE 1 TABLET (20 MG TOTAL) BY MOUTH DAILY AS NEEDED FOR FLUID OR EDEMA. 10/13/23   Loistine Sober, NP  Glucose Blood (BLOOD GLUCOSE TEST STRIPS) STRP 1 each by Does not apply route 3 (three) times daily. Use as directed to check blood sugar. May dispense any manufacturer covered by patient's insurance and fits patient's device. Patient not taking: Reported on 03/06/2024 03/04/24   Davia Nydia POUR, MD  insulin  aspart (NOVOLOG ) 100 UNIT/ML FlexPen Inject 8 Units into the skin 3 (three) times daily with meals. Only take if eating a meal AND Blood Glucose (BG) is 80 or higher. 03/04/24   Rai, Nydia POUR, MD  insulin  degludec (TRESIBA ) 100 UNIT/ML FlexTouch Pen Inject 16 Units into the skin daily. May substitute as needed per insurance. 03/05/24   Rai,  Ripudeep K, MD  Insulin  Pen Needle (PEN NEEDLES) 31G X 5 MM MISC 1 each by Does not apply route 3 (three) times daily. May dispense any manufacturer covered by patient's insurance. 03/04/24   Rai, Nydia POUR, MD  Lancet Device MISC 1 each by Does not apply route 3 (three) times daily. May dispense any manufacturer covered by patient's insurance. Patient not taking: Reported on 03/06/2024 03/04/24   Davia Nydia POUR, MD  Lancets MISC 1 each by Does not apply route 3 (three) times daily. Use as directed to check blood sugar. May dispense any manufacturer covered by patient's insurance and fits patient's  device. Patient not taking: Reported on 03/06/2024 03/04/24   Rai, Nydia POUR, MD  levothyroxine  (SYNTHROID ) 112 MCG tablet Take 112 mcg by mouth daily before breakfast.    [provider]  mirtazapine  (REMERON ) 30 MG tablet Take 30 mg by mouth at bedtime as needed (sleep). Patient not taking: Reported on 03/06/2024    [provider]  ondansetron  (ZOFRAN ) 4 MG tablet Take 1 tablet (4 mg total) by mouth every 8 (eight) hours as needed for nausea or vomiting. Patient not taking: Reported on 03/06/2024 03/04/24   Rai, Nydia POUR, MD  pantoprazole  (PROTONIX ) 40 MG tablet Take 40 mg by mouth daily. Patient not taking: Reported on 03/06/2024 12/30/23   [provider]    Allergies: Patient has no known allergies.    Review of Systems  Constitutional:  Negative for activity change, appetite change and fever.  HENT:  Negative for congestion and sinus pressure.   Respiratory:  Negative for cough, chest tightness and shortness of breath.   Cardiovascular:  Negative for chest pain.  Gastrointestinal:  Negative for abdominal pain, nausea and vomiting.  Genitourinary:  Negative for dysuria and hematuria.  Musculoskeletal:  Negative for arthralgias and myalgias.  Neurological:  Positive for dizziness and light-headedness. Negative for numbness.   all other systems are negative except as noted in the HPI and PMH.    Updated Vital Signs BP (!) 191/64 (BP Location: Right Arm)   Pulse 78   Temp 98.2 F (36.8 C) (Oral)   Resp 17   Ht 5' 2 (1.575 m)   Wt 53.5 kg   SpO2 97%   BMI 21.58 kg/m   Physical Exam Vitals and nursing note reviewed.  Constitutional:      General: She is not in acute distress.    Appearance: She is well-developed.  HENT:     Head: Normocephalic and atraumatic.     Mouth/Throat:     Pharynx: No oropharyngeal exudate.  Eyes:     Conjunctiva/sclera: Conjunctivae normal.     Pupils: Pupils are equal, round, and reactive to light.  Neck:      Comments: No meningismus. Cardiovascular:     Rate and Rhythm: Normal rate and regular rhythm.     Heart sounds: Normal heart sounds. No murmur heard. Pulmonary:     Effort: Pulmonary effort is normal. No respiratory distress.     Breath sounds: Normal breath sounds.  Abdominal:     Palpations: Abdomen is soft.     Tenderness: There is no abdominal tenderness. There is no guarding or rebound.  Musculoskeletal:        General: No tenderness. Normal range of motion.     Cervical back: Normal range of motion and neck supple.  Skin:    General: Skin is warm.  Neurological:     Mental Status: She is alert and oriented to  person, place, and time.     Cranial Nerves: No cranial nerve deficit.     Motor: No abnormal muscle tone.     Coordination: Coordination normal.     Comments:  5/5 strength throughout. CN 2-12 intact.Equal grip strength.   Psychiatric:        Behavior: Behavior normal.     (all labs ordered are listed, but only abnormal results are displayed) Labs Reviewed  CBC WITH DIFFERENTIAL/PLATELET - Abnormal; Notable for the following components:      Result Value   RBC 3.23 (*)    Hemoglobin 8.8 (*)    HCT 28.7 (*)    All other components within normal limits  COMPREHENSIVE METABOLIC PANEL WITH GFR - Abnormal; Notable for the following components:   Glucose, Bld 58 (*)    BUN 34 (*)    Creatinine, Ser 2.29 (*)    GFR, Estimated 20 (*)    All other components within normal limits  URINALYSIS, ROUTINE W REFLEX MICROSCOPIC - Abnormal; Notable for the following components:   APPearance HAZY (*)    Hgb urine dipstick SMALL (*)    Protein, ur 30 (*)    Leukocytes,Ua LARGE (*)    Bacteria, UA FEW (*)    All other components within normal limits  CBG MONITORING, ED - Abnormal; Notable for the following components:   Glucose-Capillary 44 (*)    All other components within normal limits  CBG MONITORING, ED - Abnormal; Notable for the following components:    Glucose-Capillary 69 (*)    All other components within normal limits  CBG MONITORING, ED - Abnormal; Notable for the following components:   Glucose-Capillary 100 (*)    All other components within normal limits  TROPONIN T, HIGH SENSITIVITY - Abnormal; Notable for the following components:   Troponin T High Sensitivity 20 (*)    All other components within normal limits  URINE CULTURE  TROPONIN T, HIGH SENSITIVITY    EKG: EKG Interpretation Date/Time:  Friday August 10 2024 01:17:06 EDT Ventricular Rate:  83 PR Interval:  194 QRS Duration:  103 QT Interval:  415 QTC Calculation: 488 R Axis:   -28  Text Interpretation: Sinus rhythm Left ventricular hypertrophy Borderline prolonged QT interval No significant change was found Confirmed by Carita Senior [phone]) on 08/10/2024 1:49:04 AM  Radiology: [facility] Chest Portable 1 View Result Date: 08/10/2024 EXAM: 1 VIEW(S) XRAY OF THE CHEST 08/10/2024 02:23:00 AM COMPARISON: CT chest 01/21/2023, chest x-ray 05/16/2023 CLINICAL HISTORY: weakness FINDINGS: LUNGS AND PLEURA: Low lung volumes. Chronic coarsening interstitial markings. No pleural effusion. No pneumothorax. HEART AND MEDIASTINUM: No acute abnormality of the cardiac and mediastinal silhouettes.Atherosclerotic plaque. BONES AND SOFT TISSUES: No acute osseous abnormality. IMPRESSION: 1. Low lung volumes. 2. Emphysema.- 3.  Atherosclerotic plaque. Electronically signed by: Morgane Naveau MD 08/10/2024 03:02 AM EDT RP Workstation: [id number]     Procedures   Medications Ordered in the ED  dextrose  50 % solution 25 mL (has no administration in time range)  dextrose  50 % solution 50 mL (50 mLs Intravenous Given 08/10/24 0110)                                    Medical Decision Making Amount and/or Complexity of Data Reviewed Labs: ordered. Decision-making details documented in ED Course. Radiology: ordered and independent interpretation performed. Decision-making details  documented in ED Course. ECG/medicine tests: ordered and independent interpretation performed. Decision-making  details documented in ED Course.  Risk Prescription drug management.   Diabetic with hypoglycemia due to poor p.o. intake and too much insulin .  Stable vitals on arrival.  Soft abdomen.  Neurologic exam is nonfocal.  She is given p.o. and IV dextrose  on arrival.  EKG is sinus rhythm without acute ST changes.  No chest pain or shortness of breath.  Stable vital signs.  Patient given p.o. as well as IV dextrose .  Labs are reassuring.  Anemia is stable.  Creatinine is stable.  Labs did show pyuria with large leukocytes on her urine.  Will send for culture.  Will give empiric Rocephin .  X-ray negative for pneumonia. Blood sugar improving to 100.  She is tolerating p.o.  No further episodes of hypoglycemia.  Blood sugar improved to 212.  She is tolerating p.o.  He is ambulatory without dizziness.  Denies UTI symptoms. Did have urine culture in August that grew Morganella that was resistant to penicillin.  She is she feels back to baseline.  She denies dizziness or lightheadedness.  She will restart her insulin  pump when she returns home precautions to eat properly without bolusing herself and insulin .  Instructed to call her endocrinologist in the morning for further evaluation of her insulin  pump.  Cautioned not to take extra insulin  if she is not eating.  Take antibiotic for possible UTI.  Urine culture is pending.  Return to the ED with new or worsening symptoms.    Final diagnoses:  Hypoglycemia    ED Discharge Orders     None          Emori Mumme, Garnette, MD 08/10/24 [phone]

## 2024-08-10 NOTE — ED Notes (Signed)
 Pt ambulated to restroom with steady gait.

## 2024-08-10 NOTE — ED Triage Notes (Incomplete)
 BIB EMS from home. Pt. T2DM; states she checked her blood sugar at 2100 it was 55, ate some snacks and accidentally gave herself 5 units of insulin  again at 2230. EMS gave 15g of oral glucose (snacks) and 11g of D10 CBG 129 @0032 . Also wears an insulin  pump which has been removed per EMS.

## 2024-08-10 NOTE — Discharge Instructions (Signed)
 Call your diabetes doctor in the morning to discuss any possible changes with your insulin  pump.  Make sure you are eating and not giving yourself extra insulin  if you are not eating.  Take the antibiotic for urinary tract infection.  Return to the ED with new or worsening symptoms.

## 2024-08-12 LAB — URINE CULTURE: Culture: >=100000 — AB

## 2024-08-13 ENCOUNTER — Telehealth (HOSPITAL_BASED_OUTPATIENT_CLINIC_OR_DEPARTMENT_OTHER): Payer: Self-pay | Admitting: *Deleted

## 2024-08-13 NOTE — Progress Notes (Signed)
 ED Antimicrobial Stewardship Positive Culture Follow Up   [name] is an 89 y.o. female who presented to [facility] on 08/10/2024 with a chief complaint of  Chief Complaint  Patient presents with   Hypoglycemia    Recent Results (from the past 720 hours)  Urine Culture (for pregnant, neutropenic or urologic patients or patients with an indwelling urinary catheter)     Status: Abnormal   Collection Time: 08/10/24  3:16 AM   Specimen: Urine, Clean Catch  Result Value Ref Range Status   Specimen Description   Final    URINE, CLEAN CATCH Performed at [facility], [address], [place], [place] [address]    Special Requests   Final    NONE Performed at [facility], [address] [place], [place] [address]    Culture >=100,000 COLONIES/mL KLEBSIELLA AEROGENES (A)  Final   Report Status 08/12/2024 FINAL  Final   Organism ID, Bacteria KLEBSIELLA AEROGENES (A)  Final      Susceptibility   Klebsiella aerogenes - MIC*    CEFEPIME <=0.12 SENSITIVE Sensitive     ERTAPENEM <=0.12 SENSITIVE Sensitive     CEFTRIAXONE  0.5 SENSITIVE Sensitive     CIPROFLOXACIN 0.25 SENSITIVE Sensitive     GENTAMICIN <=1 SENSITIVE Sensitive     NITROFURANTOIN 256 RESISTANT Resistant     TRIMETH/SULFA <=20 SENSITIVE Sensitive     PIP/TAZO Value in next row Sensitive      8 SENSITIVEThis is a modified FDA-approved test that has been validated and its performance characteristics determined by the reporting laboratory.  This laboratory is certified under the Clinical Laboratory Improvement Amendments CLIA as qualified to perform high complexity clinical laboratory testing.    MEROPENEM  Value in next row Sensitive      8 SENSITIVEThis is a modified FDA-approved test that has been validated and its performance characteristics determined by the reporting laboratory.  This laboratory is certified under the Clinical Laboratory Improvement Amendments CLIA as qualified to  perform high complexity clinical laboratory testing.    * >=100,000 COLONIES/mL KLEBSIELLA AEROGENES    [x]  Treated with cephalexin , organism resistant to prescribed antimicrobial  The patient was admitted with hypoglycemia, no urinary symptoms noted, no fevers or leukocytosis. UTI treatment triggered off of +UA - would recommend holding additional antibiotics at this time.  New antibiotic prescription: Call to STOP cephalexin  - no additional antibiotics needed at this time  ED Provider: Dorn Dec, PA  Thank you for allowing pharmacy to be a part of this patient's care.  Almarie Lunger, PharmD, BCPS, BCIDP Infectious Diseases Clinical Pharmacist 08/13/2024 10:30 AM   **Pharmacist phone directory can now be found on [email or web address] ([email or web address]).  Listed under [facility].

## 2024-08-13 NOTE — Telephone Encounter (Signed)
 Post ED Visit - Positive Culture Follow-up: Unsuccessful Patient Follow-up  Culture assessed and recommendations reviewed by:  []  Rankin Dee, Pharm.D. []  Venetia Gully, Pharm.D., BCPS AQ-ID []  Garrel Crews, Pharm.D., BCPS [x]  Almarie Lunger, Pharm.D., BCPS []  [place], [address].D., BCPS, AAHIVP []  Rosaline Bihari, Pharm.D., BCPS, AAHIVP []  Massie Rigg, PharmD []  Jodie Rower, PharmD, BCPS  Positive urine culture  [x]  Patient discharged with antimicrobial prescription and treatment is now NOT indicated []  Organism is resistant to prescribed ED discharge antimicrobial []  Patient with positive blood cultures   Unable to contact patient after 3 attempts, letter will be sent to address on file  Lorita Barnie Pereyra 08/13/2024, 4:01 PM

## 2024-08-28 DIAGNOSIS — M25561 Pain in right knee: Secondary | ICD-10-CM | POA: Diagnosis not present

## 2024-08-28 DIAGNOSIS — R531 Weakness: Secondary | ICD-10-CM | POA: Diagnosis not present

## 2024-09-05 DIAGNOSIS — M25561 Pain in right knee: Secondary | ICD-10-CM | POA: Diagnosis not present

## 2024-09-05 DIAGNOSIS — R531 Weakness: Secondary | ICD-10-CM | POA: Diagnosis not present

## 2024-09-07 DIAGNOSIS — N184 Chronic kidney disease, stage 4 (severe): Secondary | ICD-10-CM | POA: Diagnosis not present

## 2024-09-07 DIAGNOSIS — E1065 Type 1 diabetes mellitus with hyperglycemia: Secondary | ICD-10-CM | POA: Diagnosis not present

## 2024-09-10 DIAGNOSIS — R531 Weakness: Secondary | ICD-10-CM | POA: Diagnosis not present

## 2024-09-10 DIAGNOSIS — M25561 Pain in right knee: Secondary | ICD-10-CM | POA: Diagnosis not present

## 2024-09-12 DIAGNOSIS — E1065 Type 1 diabetes mellitus with hyperglycemia: Secondary | ICD-10-CM | POA: Diagnosis not present

## 2024-09-13 DIAGNOSIS — C674 Malignant neoplasm of posterior wall of bladder: Secondary | ICD-10-CM | POA: Diagnosis not present

## 2024-10-14 ENCOUNTER — Other Ambulatory Visit: Payer: Self-pay | Admitting: Student

## 2024-10-16 ENCOUNTER — Other Ambulatory Visit: Payer: Self-pay | Admitting: Internal Medicine

## 2024-10-16 MED ORDER — FUROSEMIDE 20 MG PO TABS: 20.0000 mg | ORAL_TABLET | Freq: Every day | ORAL | 0 refills | Status: AC | PRN

## 2024-10-16 NOTE — Addendum Note (Signed)
 Addended by: BLUFORD, Tamre Cass L on: 10/16/2024 04:18 PM   Modules accepted: Orders

## 2024-11-09 ENCOUNTER — Other Ambulatory Visit: Payer: Self-pay | Admitting: Internal Medicine
